# Patient Record
Sex: Female | Born: 1950 | Race: White | Hispanic: No | Marital: Married | Smoking: Never smoker
Health system: Southern US, Community
[De-identification: ages and names within clinical notes are randomized; demographics above are authoritative.]

## PROBLEM LIST (undated history)

## (undated) DIAGNOSIS — J45909 Unspecified asthma, uncomplicated: Secondary | ICD-10-CM

## (undated) DIAGNOSIS — K648 Other hemorrhoids: Secondary | ICD-10-CM

## (undated) DIAGNOSIS — K219 Gastro-esophageal reflux disease without esophagitis: Secondary | ICD-10-CM

## (undated) DIAGNOSIS — I509 Heart failure, unspecified: Secondary | ICD-10-CM

## (undated) DIAGNOSIS — R42 Dizziness and giddiness: Secondary | ICD-10-CM

## (undated) DIAGNOSIS — I10 Essential (primary) hypertension: Secondary | ICD-10-CM

## (undated) DIAGNOSIS — D509 Iron deficiency anemia, unspecified: Secondary | ICD-10-CM

## (undated) DIAGNOSIS — D126 Benign neoplasm of colon, unspecified: Secondary | ICD-10-CM

## (undated) DIAGNOSIS — M810 Age-related osteoporosis without current pathological fracture: Secondary | ICD-10-CM

## (undated) DIAGNOSIS — K279 Peptic ulcer, site unspecified, unspecified as acute or chronic, without hemorrhage or perforation: Secondary | ICD-10-CM

## (undated) HISTORY — DX: Benign neoplasm of colon, unspecified: D12.6

## (undated) HISTORY — PX: OTHER SURGICAL HISTORY: SHX169

## (undated) HISTORY — PX: CHOLECYSTECTOMY: SHX55

## (undated) HISTORY — PX: BACK SURGERY: SHX140

## (undated) HISTORY — PX: KNEE SURGERY: SHX244

## (undated) HISTORY — DX: Other hemorrhoids: K64.8

## (undated) HISTORY — DX: Iron deficiency anemia, unspecified: D50.9

## (undated) HISTORY — PX: SHOULDER SURGERY: SHX246

## (undated) HISTORY — DX: Peptic ulcer, site unspecified, unspecified as acute or chronic, without hemorrhage or perforation: K27.9

## (undated) HISTORY — DX: Essential (primary) hypertension: I10

## (undated) HISTORY — PX: BREAST SURGERY: SHX581

## (undated) HISTORY — PX: TONSILLECTOMY: SUR1361

---

## 1999-07-17 ENCOUNTER — Other Ambulatory Visit: Admission: RE | Admit: 1999-07-17 | Discharge: 1999-07-17 | Payer: Self-pay | Admitting: Obstetrics and Gynecology

## 2000-01-14 ENCOUNTER — Ambulatory Visit (HOSPITAL_COMMUNITY): Admission: RE | Admit: 2000-01-14 | Discharge: 2000-01-14 | Payer: Self-pay | Admitting: Family Medicine

## 2000-01-14 ENCOUNTER — Encounter: Payer: Self-pay | Admitting: Family Medicine

## 2000-07-02 ENCOUNTER — Encounter: Admission: RE | Admit: 2000-07-02 | Discharge: 2000-07-02 | Payer: Self-pay | Admitting: Family Medicine

## 2000-07-02 ENCOUNTER — Encounter: Payer: Self-pay | Admitting: Family Medicine

## 2000-08-08 ENCOUNTER — Emergency Department (HOSPITAL_COMMUNITY): Admission: EM | Admit: 2000-08-08 | Discharge: 2000-08-08 | Payer: Self-pay | Admitting: Emergency Medicine

## 2000-11-02 ENCOUNTER — Ambulatory Visit (HOSPITAL_COMMUNITY): Admission: RE | Admit: 2000-11-02 | Discharge: 2000-11-02 | Payer: Self-pay | Admitting: Family Medicine

## 2000-11-02 ENCOUNTER — Encounter: Payer: Self-pay | Admitting: Family Medicine

## 2001-06-27 ENCOUNTER — Ambulatory Visit (HOSPITAL_BASED_OUTPATIENT_CLINIC_OR_DEPARTMENT_OTHER): Admission: RE | Admit: 2001-06-27 | Discharge: 2001-06-27 | Payer: Self-pay | Admitting: Orthopedic Surgery

## 2002-05-11 ENCOUNTER — Other Ambulatory Visit: Admission: RE | Admit: 2002-05-11 | Discharge: 2002-05-11 | Payer: Self-pay | Admitting: *Deleted

## 2002-06-29 ENCOUNTER — Encounter: Payer: Self-pay | Admitting: Family Medicine

## 2002-06-29 ENCOUNTER — Ambulatory Visit (HOSPITAL_COMMUNITY): Admission: RE | Admit: 2002-06-29 | Discharge: 2002-06-29 | Payer: Self-pay | Admitting: Family Medicine

## 2002-07-03 ENCOUNTER — Ambulatory Visit: Admission: RE | Admit: 2002-07-03 | Discharge: 2002-07-03 | Payer: Self-pay | Admitting: Family Medicine

## 2002-07-24 ENCOUNTER — Encounter: Payer: Self-pay | Admitting: Family Medicine

## 2002-07-24 ENCOUNTER — Ambulatory Visit (HOSPITAL_COMMUNITY): Admission: RE | Admit: 2002-07-24 | Discharge: 2002-07-24 | Payer: Self-pay | Admitting: Family Medicine

## 2002-08-08 ENCOUNTER — Encounter: Payer: Self-pay | Admitting: Family Medicine

## 2002-08-08 ENCOUNTER — Ambulatory Visit (HOSPITAL_COMMUNITY): Admission: RE | Admit: 2002-08-08 | Discharge: 2002-08-08 | Payer: Self-pay | Admitting: Family Medicine

## 2003-06-16 ENCOUNTER — Ambulatory Visit (HOSPITAL_COMMUNITY): Admission: RE | Admit: 2003-06-16 | Discharge: 2003-06-16 | Payer: Self-pay | Admitting: Family Medicine

## 2003-06-16 ENCOUNTER — Encounter: Payer: Self-pay | Admitting: Family Medicine

## 2003-11-26 ENCOUNTER — Ambulatory Visit (HOSPITAL_COMMUNITY): Admission: RE | Admit: 2003-11-26 | Discharge: 2003-11-26 | Payer: Self-pay | Admitting: Gastroenterology

## 2004-01-09 ENCOUNTER — Ambulatory Visit (HOSPITAL_COMMUNITY): Admission: RE | Admit: 2004-01-09 | Discharge: 2004-01-09 | Payer: Self-pay | Admitting: Gastroenterology

## 2004-01-31 ENCOUNTER — Encounter (INDEPENDENT_AMBULATORY_CARE_PROVIDER_SITE_OTHER): Payer: Self-pay | Admitting: Specialist

## 2004-01-31 ENCOUNTER — Inpatient Hospital Stay (HOSPITAL_COMMUNITY): Admission: RE | Admit: 2004-01-31 | Discharge: 2004-02-02 | Payer: Self-pay | Admitting: Plastic Surgery

## 2004-10-21 ENCOUNTER — Ambulatory Visit (HOSPITAL_COMMUNITY): Admission: RE | Admit: 2004-10-21 | Discharge: 2004-10-21 | Payer: Self-pay | Admitting: Family Medicine

## 2004-10-23 ENCOUNTER — Ambulatory Visit: Admission: RE | Admit: 2004-10-23 | Discharge: 2004-10-23 | Payer: Self-pay | Admitting: Family Medicine

## 2004-10-31 ENCOUNTER — Ambulatory Visit (HOSPITAL_COMMUNITY): Admission: RE | Admit: 2004-10-31 | Discharge: 2004-10-31 | Payer: Self-pay | Admitting: Family Medicine

## 2004-11-14 ENCOUNTER — Ambulatory Visit (HOSPITAL_COMMUNITY): Admission: RE | Admit: 2004-11-14 | Discharge: 2004-11-14 | Payer: Self-pay | Admitting: Family Medicine

## 2007-01-04 ENCOUNTER — Other Ambulatory Visit: Admission: RE | Admit: 2007-01-04 | Discharge: 2007-01-04 | Payer: Self-pay | Admitting: Obstetrics and Gynecology

## 2009-08-19 ENCOUNTER — Emergency Department (HOSPITAL_COMMUNITY): Admission: EM | Admit: 2009-08-19 | Discharge: 2009-08-20 | Payer: Self-pay | Admitting: Emergency Medicine

## 2010-12-16 LAB — DIFFERENTIAL
Basophils Absolute: 0 10*3/uL (ref 0.0–0.1)
Basophils Relative: 0 % (ref 0–1)
Eosinophils Absolute: 0 10*3/uL (ref 0.0–0.7)
Eosinophils Relative: 1 % (ref 0–5)
Lymphocytes Relative: 9 % — ABNORMAL LOW (ref 12–46)
Monocytes Absolute: 0.5 10*3/uL (ref 0.1–1.0)
Monocytes Relative: 5 % (ref 3–12)
Neutro Abs: 7.3 10*3/uL (ref 1.7–7.7)

## 2010-12-16 LAB — CBC
MCHC: 33.8 g/dL (ref 30.0–36.0)
RDW: 14.5 % (ref 11.5–15.5)
WBC: 8.6 10*3/uL (ref 4.0–10.5)

## 2010-12-16 LAB — POCT I-STAT, CHEM 8
Calcium, Ion: 1.08 mmol/L — ABNORMAL LOW (ref 1.12–1.32)
Chloride: 104 mEq/L (ref 96–112)
Hemoglobin: 12.9 g/dL (ref 12.0–15.0)
TCO2: 27 mmol/L (ref 0–100)

## 2011-01-30 NOTE — Op Note (Signed)
NAME:  [name], [name]                          ACCOUNT NO.:  [id number]   MEDICAL RECORD NO.:  [id number]                   PATIENT TYPE:  AMB   LOCATION:  ENDO                                 FACILITY:  [facility]   PHYSICIAN:  James L. Malon Kindle., M.D.          DATE OF BIRTH:  Feb 20, 1951   DATE OF PROCEDURE:  11/26/2003  DATE OF DISCHARGE:                                 OPERATIVE REPORT   PROCEDURE:  Esophagogastroduodenoscopy and biopsy.   MEDICATIONS:  1. Fentanyl 75 mg.  2. Versed 6 mg IV.   INDICATIONS FOR PROCEDURE:  The patient has had Burns lot of nausea, vomiting,  inability to eat, has had mini gastric bypass.   DESCRIPTION OF PROCEDURE:  The procedure had been explained to the patient  and consent obtained.  With the patient in the left lateral decubitus  position, the Olympus scope was inserted and advanced.  We reached the  stomach, and the patient had Burns very small gastric pouch, and we came to an  area in which she appeared to have two lumens.  It appeared to be Burns  gastrojejunostomy.  Right in the middle of the two limbs was an anastomotic  ulcer.  It was not actively bleeding.  I went down both limbs.  They were  widely patent.  There was no stenosis.  The scope was withdrawn back into  the gastric remnant, and Burns biopsy was taken for Helicobacter.  The ulcer was  not actively bleeding.  The gastric remnant was otherwise okay.  The scope  was withdrawn, and the distal and proximal esophagus are endoscopically  normal.   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  531.30.   PLAN:  1. We will start patient on Carafate.  2. We will give over-the-counter Prilosec as well and see back in the office     in 6-8 weeks.  3. We will check the results of the CLOtest.                                               James L. Malon Kindle., M.D.    Waldron Session  D:  11/26/2003  T:  11/26/2003  Job:  [id number]

## 2011-01-30 NOTE — Discharge Summary (Signed)
NAME:  [name], [name]                          ACCOUNT NO.:  [id number]   MEDICAL RECORD NO.:  [id number]                   PATIENT TYPE:  INP   LOCATION:  0449                                 FACILITY:  [facility]   PHYSICIAN:  Alfredia Ferguson, M.D.               DATE OF BIRTH:  Jul 24, 1951   DATE OF ADMISSION:  01/31/2004  DATE OF DISCHARGE:  02/02/2004                                 DISCHARGE SUMMARY   ADMISSION DIAGNOSES:  1. Large diastasis recti.  2. Status post massive weight loss (230 pounds).   DISCHARGE DIAGNOSES:  1. Large diastasis recti.  2. Status post massive weight loss (230 pounds).   OPERATIONS PERFORMED:  1. Circumferential abdominoplasty.  2. Mastopexy with implants.  3. Repair of large diastasis recti.   CHIEF COMPLAINT:  I have Burns large bulge in my abdomen.  I have Burns lot of  excess skin left over from weight loss.   HISTORY OF PRESENT ILLNESS:  This is Burns 60 year old woman who is Burns couple of  years status post bariatric surgery for obesity.  She has lost 230 pounds in  the last 2 years.  She has empty breasts with Burns large amount of excess skin.  She has Burns large amount of excess skin in her anterior abdomen, lateral thigh  areas, and posterior lower back.  She is admitted to the hospital at this  time for circumferential abdominoplasty, breast lift with implants, and  repair of Burns vary large diastasis recti.   PAST MEDICAL HISTORY:  1. Significant for mild hypertension, which has resolved with weight loss.  2. She also has Burns recent history of hypokalemia.   PAST SURGICAL HISTORY:  Bariatric stomach stapling.   PHYSICAL EXAMINATION:  Please see admission H&P for complete physical  examination.   ADMISSION LABORATORY VALUES:  Included Burns hemoglobin of 11.3, hematocrit of  33.7, and Burns white count of 4800.  Electrolytes showed Burns potassium of 3.7 on  admission, slightly low sodium at 134.  Her glucose was 84 on admission.  Urinalysis was negative.  Chest x-ray  showed no evidence of active disease,  and cardiogram was normal.   HOSPITAL COURSE:  On the day of admission, the patient was taken to the  operating room where she underwent circumferential abdominoplasty, repair of  diastasis recti, and Burns mastopexy with Mentor smooth shell saline implants,  300 cc, inflated to 325 cc bilaterally.  Surgery was uncomplicated.  The  postoperative course has been uneventful.  She was allowed to get out of bed  on the first postoperative morning.  She had PAS hose on the entire time she  was in the hospital while in bed.  Her diet was advanced on the evening  after surgery, and she was tolerating Burns regular diet on the following  morning after surgery.  Dressings were removed on the second postoperative  day.  All incisions were healing  nicely.  Breasts looked good with nipples  viable.  She has 4 drains in place, all of which are draining more than 30  cc per 24 hours.  The patient has been instructed on home drain care.  No  dressing is required for the abdominal dressing.  The patient does have  slight redness over her coccyx due to lying in bed for 2 days.  She was  advised to sleep either on her side, or with Burns padded area underneath her  coccyx.  Her pain is tolerable.  She has been given pain medication of  Vicodin 1-2 q.4h. p.r.n. pain, and Burns prescription for Keflex 500 mg q.i.d.  for 5 days.  Follow up will be provided in 6 days in my office.  The patient  was advised to call me if there are any questions or concerns when she goes  home.  The patient states she understands her instructions and is willing to  comply.                                               Alfredia Ferguson, M.D.    WBB/MEDQ  D:  02/02/2004  T:  02/02/2004  Job:  [id number]

## 2011-01-30 NOTE — Op Note (Signed)
NAME:  [name], [name]                          ACCOUNT NO.:  [id number]   MEDICAL RECORD NO.:  [id number]                   PATIENT TYPE:  INP   LOCATION:  0449                                 [facility]ACILITY:  [facility]   PHYSICIAN:  Alfredia Ferguson, M.D.               DATE OF BIRTH:  29-Sep-1950   DATE OF PROCEDURE:  01/31/2004  DATE OF DISCHARGE:                                 OPERATIVE REPORT   PREOPERATIVE DIAGNOSES:  1. Status post massive weight loss of 230 pounds.  2. Excess skin of abdomen.  3. Excess skin of lower back.  4. Bilateral breast ptosis with marked loss of volume.   POSTOPERATIVE DIAGNOSES:  1. Status post massive weight loss of 230 pounds.  2. Excess skin of abdomen.  3. Excess skin of lower back.  4. Bilateral breast ptosis with marked loss of volume.   OPERATION PERFORMED:  1. Circumferential abdominoplasty.  2. Augmentation mastopexy with Mentor smooth shell saline 300 mL implants     inflated to Burns total volume of 325 mL bilaterally.   SURGEON:  Dr. Benna Dunks   FIRST ASSISTANT:  Vevelyn Francois, RNFA   ANESTHESIA:  General endotracheal anesthesia.   INDICATION FOR SURGERY:  Burns 60 year old woman, who is status post bariatric  surgery.  She has lost approximately 230 pounds.  She now has excess skin of  her entire mid trunk.  She also has significant loss of volume of her  breasts with ptosis.  The patient wishes to have Burns breast lift and  augmented.  She wishes to have the maximum amount of skin removed from her  mid truncal area.  Potential risks of these surgeries including but not  limited to bleeding, infection, hematoma, seroma, the need for transfusion,  unsightly scarring, asymmetric results on side to the other, capsular  contracture of the breast implant, rippling of the implant, malposition of  the implant, infection of the implant, the need to replace the implant on  multiple occasions over her lifetime, wound healing difficulties, loss of  sensitivity  to nipple areolar complex, asymmetry of the breasts, prolonged  drainage from any of her drains in the back or abdomen, and overall  dissatisfaction with the results.  In spite of these and other risks  discussed with the patient, she wishes to proceed with the surgery.   DESCRIPTION OF OPERATION:  On the day prior to surgery, skin marks were  placed outlining dimensions of Burns Wise pattern mastopexy.  Dimensions of the  skin excision of the anterior and posterior trunks were also marked.  The  patient was taken to the operating room today where she was given general  endotracheal anesthesia.  Following anesthesia, she was rolled into Burns prone  position with all pressure points inspected and padded.  Tumescent solution  was infiltrated in the lower back using Burns solution of 1000 mL of Ringer's  lactate plus 1 ampule  of epinephrine plus 20 mL of 1% Xylocaine plain.  This  tumescent solution assisted with hemostasis.  The upper end of the skin mark  was now incised in the lower back.  This incision was deepened until getting  just above the fascia of the lower back.  The excess skin in the midline was  split in the midline between the anticipated line of dissection just above  her gluteal cleft and the incision which was already made.  The flap was  elevated in the middle to ensure closure without undue tension and once I  was certain that Burns tension-free closure could be accomplished, the excess  skin and fat was dissected from Burns medial to lateral direction.  Hemostasis  was meticulously maintained throughout the dissection.  The excess skin and  fat was carried all the way as far laterally as I could and at that point,  amputated.  The wound was copiously irrigated with saline irrigation.  Hemostasis was meticulously maintained.  The lower portion of the back  incision was undermined over the gluteal muscles for Burns distance of  approximately 6-7 cm bilaterally.  This allowed the upper buttocks  to  advance superiorly.  The posterior wound was closed over 2 Blake drains  which were brought out through separate stab incisions.  Burns combination of 0  Vicryl and 2-0 Vicryl in the dermis was placed.  Burns running 3-0 Monocryl for  the skin in the subcuticular position was used to close the skin edges.  Closure was accomplished without undue tension.  There was no blanching  along the incision line.  The patient's back skin was cleansed and dried,  and dressings were placed and secured in place with OpSite.  The patient was  rolled into Burns supine position onto another operating table.  All pressure  points were inspected and well-padded.  The patient's chest and abdomen were  prepped with Betadine and draped with sterile drapes.  Attention was first  directed to the breasts.  I opted to augment her with 300 mL Mentor smooth  shell saline implants, inflated to Burns total volume of 325 mL.  In  inframammary crease incision was made and deepened until reaching the  inferior pectoralis muscle fibers.  The fibers were opened, and Burns  subpectoral pocket was created.  Electrocautery and blunt dissection was  used to create the pocket.  Once Burns pocket of adequate size to accommodate  the desired implant had been created, it was irrigated with saline  irrigation.  The Mentor implant was prepared by evacuating the air and  placing 100 mL of saline.  The pocket was once again inspected and once  ready, the implant was placed in the desired position and filled to Burns total  volume of 325 mL.  The fill tubing was removed from the fill port, and Burns cap  over the fill port was pushed down into the opening.  The deep breast tissue  was now reapproximated at the incision using multiple interrupted 3-0 Vicryl  sutures.  Burns 42 mm diameter circle was drawn around the nipple, and this  circle was incised.  All skin marks were also incised.  The skin within the confines of these incisions were deepithelized.  Incisions  were made through  the dermis along the vertical limb of the medial and lateral breast flap and  along the new location for the nipple areolar complex to allow advancement  of the medial and lateral breast flap.  The  inferior corner of the medial  and lateral breast flaps were united to the mid portion of the inframammary  crease with Burns 2-0 Vicryl suture.  The superior corner of the vertical  incision the medial and lateral breast flaps were united to each other with  Burns similar suture.  The nipple areolar complex was pulled up to its new  location and fixed in position using multiple interrupted 3-0 Monocryl  suture for the dermis followed by Burns running 4-0 Monocryl subcuticular for  the skin edge.  The inframammary crease incision was closed using  interrupted 3-0 Monocryl suture for the dermis followed by Burns running 3-0  Monocryl subcuticular.  The vertical incision was closed in Burns similar  fashion.  Attention was directed to the left breast where identical  procedure was performed.  The implant was inflated to the same volume.  Symmetry was acceptable at the conclusion of the procedure.  The lower  abdomen was now inspected.  Skin marks were still in place from the  anticipated lines of excision.  Burns circular incision was made around the  umbilicus, and the umbilicus was dissected away from the surrounding tissue,  leaving Burns healthy cuff of fat to ensure vascular integrity.  The lower skin  incision in the abdomen was made, and Burns skin fat flap was elevated off of  the anterior abdominal wall fascia from an inferior to superior direction.  The abdominal flap was split in the midline to facilitate dissection.  The  flap was elevated to approximately 3-4 cm below the costal margins to help  with vascularity.  The midline dissection was carried all the way to the  xiphoid.  The patient had Burns very large diastasis measuring approximately 8-9  cm in width and approximately 12 cm in length.  This  diastasis was  imbricated and closed with multiple interrupted buried figure-of-eight 0  Prolene sutures.  The patient's back was now elevated on the operating table  to approximately 30 degrees, and the knees were flexed.  Two Blake drains  were placed in the wound and brought out through separate stab incisions.  The excess skin was pulled in an inferior direction as tightly as I could.  The skin was re-marked for the amount of skin to be removed, and the excess  skin was excised using Burns combination of sharp dissection and electrocautery  dissection.  As the skin was excised, it was Taylor-tacked to the lower  incision to ensure Burns tension-free closure.  Every 3-4 cm of skin removed,  the incision was tacked together.  The excision of skin continued laterally  until reaching the dog ear which had been created by the posterior back  excision.  This dog ear was removed.  Hemostasis was again meticulously maintained.  The wound was irrigated copiously with saline irrigation.  The  wound was temporarily stapled in position, and Burns new opening to the  umbilicus was made, and the umbilicus was brought through this new opening.  The umbilicus was fixed in this location with multiple interrupted 3-0  Monocryl suture.  The abdominal wound was closed using Burns combination of 0  Vicryl sutures for the lateral area where the maximum tension was and 2-0  Vicryl sutures for the more anterior incision.  Interrupted 3-0 Monocryls  were also used in between the 2-0 Vicryls.  Burns running 3-0 Monocryl  subcuticular was placed in the skin edges.  The patient tolerated the  procedure well with an estimated blood loss  of approximately 400 mL.  The  patient's breasts and chest were cleansed, dried, and Steri-Strips were  applied to the incision.  The patient was awakened, extubated, and  transported to her hospital bed in the position we placed her in on the  operating table.                                                Alfredia Ferguson, M.D.    WBB/MEDQ  D:  01/31/2004  T:  01/31/2004  Job:  [id number]

## 2011-01-30 NOTE — Op Note (Signed)
[place]  Patient:    [name], [name] Visit Number: [id number] MRN: [id number]          Service Type: DSU Location: [facility] Attending Physician:  Twana First Dictated by:   Elana Alm Thurston Hole, M.D. Proc. Date: 06/27/01 Admit Date:  06/27/2001                             Operative Report  PREOPERATIVE DIAGNOSIS:  Left shoulder partial rotator cuff tear with impingement.  POSTOPERATIVE DIAGNOSIS:  Left shoulder partial rotator cuff tear with partial impingement.  PROCEDURES: 1. Left shoulder examination under anesthesia, followed by arthroscopic    partial rotator cuff tear debridement. 2. Left shoulder subacromial decompression.  SURGEON:  Elana Alm. Thurston Hole, M.D.  ASSISTANT:  Julien Girt, P.Burns.  ANESTHESIA:  General.  OPERATIVE TIME:  45 minutes.  COMPLICATIONS:  None.  INDICATION FOR PROCEDURE:  [name] is Burns 60 year old woman who has had significant problems with pain in her left shoulder over the past four to five months, increasing in nature, with signs and symptoms and MRI documenting Burns partial rotator cuff tear and impingement, who has failed conservative care and is now to undergo arthroscopy.  DESCRIPTION OF PROCEDURE:  [name] was brought to the operating room on June 27, 2001, placed on the operative table in supine position.  After an adequate level of general anesthesia was obtained, her left shoulder was examined under anesthesia.  She had full range of motion in her shoulder with stable ligamentous exam.  After this was done, she was placed in Burns beach chair position and her shoulder and arm were prepped using sterile Betadine and draped using sterile technique.  Originally through Burns posterior arthroscopic portal, the arthroscope with the pump attached was placed into an anterior portal and an arthroscopic probe was placed.  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  The anterior and posterior labrum was intact.  Superior labrum and biceps tendon anchor showed some mild fraying, which was debrided with the biceps tendon anchor itself well-anchored.  The biceps tendon had some partial tearing, 20%, which was debrided; otherwise, it was intact.  The rotator cuff showed Burns partial undersurface tear 20% of the supraspinatus, which was debrided; otherwise, the rest of the rotator cuff was found to be intact.  The inferior capsule recess free of pathology.  After this was done, then the subacromial space was entered and Burns lateral arthroscopic portal was made.  The moderately thickened bursitis was resected.  Underneath this the rotator cuff was inflamed and thickened but no evidence of Burns tear.  The subacromial decompression was carried out, removing 6 mm of the undersurface of the anterior, anterolateral, and anteromedial acromion, and CA ligament release carried out.  The [facility] joint was not disturbed.  After this was done, the shoulder could be brought through Burns full range of motion with no impingement on the rotator cuff.  At this point it was felt that all pathology had been satisfactorily addressed.  The instruments were removed.  Portals closed with 3-0 nylon suture and injected with 0.25% Marcaine with epinephrine, sterile dressings and Burns sling applied, and the patient awakened and taken to the recovery room in stable condition.  FOLLOW-UP CARE:  [name] will be followed as an outpatient on Vicodin and Naprosyn.  See  her back in the office in Burns week for sutures out and follow-up. Dictated by:   Elana Alm Thurston Hole, M.D. Attending Physician:  Twana First DD:  06/27/01 TD:  06/27/01 Job: [id number]

## 2011-01-30 NOTE — Op Note (Signed)
NAME:  [name], [name]                          ACCOUNT NO.:  [id number]   MEDICAL RECORD NO.:  [id number]                   PATIENT TYPE:  AMB   LOCATION:  ENDO                                 FACILITY:  [facility]   PHYSICIAN:  James L. Malon Kindle., M.D.          DATE OF BIRTH:  1951-04-17   DATE OF PROCEDURE:  01/09/2004  DATE OF DISCHARGE:                                 OPERATIVE REPORT   PROCEDURE:  Esophagogastroduodenoscopy.   MEDICATIONS:  Fentanyl 50 mcg, Versed 6 mg IV.   INDICATIONS FOR PROCEDURE:  The patient has had Burns mini gastric bypass and Burns  large ulcer seen back in March __________  .  This was done to document  healing.  She will undergo surgery soon by Dr. Benna Dunks.   DESCRIPTION OF PROCEDURE:  The procedure had been explained to the patient,  consent obtained.  With the patient in the left lateral decubitus position,  the scope was inserted and advanced.  The aspirate pouch was entered. There  was Burns Billroth II, both limbs were entered and were normal.  Next, the  junction between the two limbs were crossed into the gastric mucosa, which  was where the ulcer was __________  small gastric pouch without ulceration.  The distal esophagus was  __________  removal and were normal.  The scope  was withdrawn, the patient tolerated the procedure well.   ASSESSMENT:  Anastomotic ulcer healed, 531.71.   PLAN:  Will stop the Carafate and continue on Prilosec over the counter. She  will return to see me in six months.  Go ahead with her surgery we Dr.  Benna Dunks.                                               James L. Malon Kindle., M.D.    Waldron Session  D:  01/09/2004  T:  01/09/2004  Job:  [id number]

## 2011-06-10 ENCOUNTER — Other Ambulatory Visit: Payer: Self-pay | Admitting: Family Medicine

## 2011-06-10 ENCOUNTER — Ambulatory Visit
Admission: RE | Admit: 2011-06-10 | Discharge: 2011-06-10 | Disposition: A | Discharge status: Home / Self Care | Payer: 59 | Source: Ambulatory Visit | Attending: Family Medicine | Admitting: Family Medicine

## 2011-06-10 DIAGNOSIS — R0600 Dyspnea, unspecified: Secondary | ICD-10-CM

## 2011-06-10 MED ORDER — IOHEXOL 300 MG/ML  SOLN
125.0000 mL | Freq: Once | INTRAMUSCULAR | Status: AC | PRN
  Administered 2011-06-10: 125 mL via INTRAVENOUS

## 2011-06-15 ENCOUNTER — Other Ambulatory Visit: Payer: Self-pay | Admitting: Family Medicine

## 2011-06-15 DIAGNOSIS — R609 Edema, unspecified: Secondary | ICD-10-CM

## 2011-06-15 DIAGNOSIS — R52 Pain, unspecified: Secondary | ICD-10-CM

## 2011-06-16 ENCOUNTER — Ambulatory Visit
Admission: RE | Admit: 2011-06-16 | Discharge: 2011-06-16 | Disposition: A | Discharge status: Home / Self Care | Payer: 59 | Source: Ambulatory Visit | Attending: Family Medicine | Admitting: Family Medicine

## 2011-06-16 DIAGNOSIS — R609 Edema, unspecified: Secondary | ICD-10-CM

## 2011-06-16 DIAGNOSIS — R52 Pain, unspecified: Secondary | ICD-10-CM

## 2011-07-14 ENCOUNTER — Inpatient Hospital Stay (HOSPITAL_BASED_OUTPATIENT_CLINIC_OR_DEPARTMENT_OTHER)
Admission: RE | Admit: 2011-07-14 | Discharge: 2011-07-14 | Disposition: A | Discharge status: Home / Self Care | Payer: 59 | Source: Ambulatory Visit | Attending: Cardiology | Admitting: Cardiology

## 2011-07-14 DIAGNOSIS — I472 Ventricular tachycardia, unspecified: Secondary | ICD-10-CM | POA: Insufficient documentation

## 2011-07-14 DIAGNOSIS — R609 Edema, unspecified: Secondary | ICD-10-CM | POA: Insufficient documentation

## 2011-07-14 DIAGNOSIS — I4729 Other ventricular tachycardia: Secondary | ICD-10-CM | POA: Insufficient documentation

## 2011-07-14 DIAGNOSIS — I059 Rheumatic mitral valve disease, unspecified: Secondary | ICD-10-CM | POA: Insufficient documentation

## 2011-07-14 LAB — POCT I-STAT 3, VENOUS BLOOD GAS (G3P V)
Acid-base deficit: 6 mmol/L — ABNORMAL HIGH (ref 0.0–2.0)
O2 Saturation: 68 %
pH, Ven: 7.296 (ref 7.250–7.300)

## 2011-07-14 LAB — POCT I-STAT 3, ART BLOOD GAS (G3+)
Bicarbonate: 21.1 mEq/L (ref 20.0–24.0)
pCO2 arterial: 39.2 mmHg (ref 35.0–45.0)
pH, Arterial: 7.338 — ABNORMAL LOW (ref 7.350–7.400)

## 2011-07-16 NOTE — Cardiovascular Report (Signed)
NAME:  [name], [name]                ACCOUNT NO.:  [id number]  MEDICAL RECORD NO.:  [id number]  LOCATION:                                 FACILITY:  PHYSICIAN:  Jake Bathe, MD           DATE OF BIRTH:  DATE OF PROCEDURE: DATE OF DISCHARGE:                           CARDIAC CATHETERIZATION   INDICATIONS:  [name] is a 60 year old female with lower extremity edema, and Holter monitor which was done after a syncopal episode demonstrating slow ventricular rhythm concerning for degradation to ventricular tachycardia.  Informed consent was performed.  Risk and benefits of procedure were discussed including stroke, heart attack, death, renal impairment, bleeding, she decided to proceed after time for questioning.  PROCEDURE IN DETAILS:  Lidocaine 1% was used for local anesthesia after visualizing the femoral head with fluoroscopy.  A 4-French sheath was inserted to the right femoral artery.  A Judkins left #4 and a no-torque Williams right were used to selectively cannulate the coronary arteries. Multiple views of the hand injection of Omnipaque were obtained.  Angled pigtail was used to cross the left ventricle.  Power injection with 25 mL of contrast was performed.  A right heart catheterizations was then performed secondary to her increasing lower extremity edema, and a 7- French sheath was inserted into the right femoral vein with the modified Seldinger technique.  Right-sided pressures was performed.  Saturations drawn.  Following procedure, catheters and sheaths were removed.  FINDINGS:  Coronary arteries.  Left main branches into the LAD as well as the circumflex artery and is short.  The circumflex is large, dominant giving rise to the posterior descending artery as well as 2 other obtuse marginal branches, and the LAD is moderate sized and caliber when compared to the circumflex artery and gives rise to 1 large diagonal branch.  The arteries themselves are quite tortuous,  but demonstrate no evidence of any flow-limiting coronary artery disease. The right coronary artery is small and is nondominant.  Left ventriculogram demonstrated normal left ventricular ejection fraction of 55% with mild mitral regurgitation.  No wall motion abnormalities demonstrated.  Ascending aorta appears normal.  Right heart catheterizations demonstrated a PA saturation of 68%, AO saturation of 95%.  Cardiac output of 5.2 liters/minute with a cardiac index of 2.9, normal, right atrial pressure 12/11 with a mean of 10, right ventricular pressure 32/7 with an end-diastolic pressure of 12 mmHg, wedge pressure of 18/15 with a mean of 13 mmHg, pulmonary artery pressure of 31/12 with a mean of 20 mmHg, left ventricular pressure was 110/11 with an end- diastolic pressure of 17 mmHg, aortic pressure is 112/61 with a mean of 84 mmHg.  There was no significant gradient.  IMPRESSIONS: 1. No angiographically significant coronary artery disease with left     dominant system. 2. Normal left ventricular ejection fraction with no wall motion     abnormalities and mild mitral regurgitation with no aortic     stenosis. 3. Normal right heart catheterizations with no evidence of pulmonary     hypertension with right atrial pressures, upper limits of normal at     10 mmHg/mildly elevated, but not high  enough to be responsible for     increasing lower extremity edema.  Findings have been discussed     with the patient given the slow idioventricular rhythm seen mostly     at night.  We will go ahead and proceed with sleep study to ensure     that she is not having any     hypoxic episodes contributing to these events.  I will also go     ahead and initiate metoprolol succinate 25 mg once a day to help     suppress rhythm.  Findings were discussed with the patient.  Note,     she does have low albumin, CT scan negative for pulmonary embolism.     Jake Bathe, MD     MCS/MEDQ  D:  07/14/2011   T:  07/14/2011  Job:  [id number]  cc:   Deatra James, M.D.  Electronically Signed by Donato Schultz MD on 07/16/2011 06:22:10 AM

## 2012-10-18 ENCOUNTER — Other Ambulatory Visit (HOSPITAL_COMMUNITY)
Admission: RE | Admit: 2012-10-18 | Discharge: 2012-10-18 | Disposition: A | Discharge status: Home / Self Care | Payer: 59 | Source: Ambulatory Visit | Attending: Obstetrics and Gynecology | Admitting: Obstetrics and Gynecology

## 2012-10-18 ENCOUNTER — Other Ambulatory Visit: Payer: Self-pay | Admitting: Nurse Practitioner

## 2012-10-18 DIAGNOSIS — Z1151 Encounter for screening for human papillomavirus (HPV): Secondary | ICD-10-CM | POA: Insufficient documentation

## 2012-10-18 DIAGNOSIS — R87810 Cervical high risk human papillomavirus (HPV) DNA test positive: Secondary | ICD-10-CM | POA: Insufficient documentation

## 2012-10-18 DIAGNOSIS — Z01419 Encounter for gynecological examination (general) (routine) without abnormal findings: Secondary | ICD-10-CM | POA: Insufficient documentation

## 2013-06-01 ENCOUNTER — Other Ambulatory Visit: Payer: Self-pay | Admitting: Gastroenterology

## 2013-11-30 ENCOUNTER — Other Ambulatory Visit (HOSPITAL_COMMUNITY)
Admission: RE | Admit: 2013-11-30 | Discharge: 2013-11-30 | Disposition: A | Discharge status: Home / Self Care | Payer: 59 | Source: Ambulatory Visit | Attending: Obstetrics and Gynecology | Admitting: Obstetrics and Gynecology

## 2013-11-30 ENCOUNTER — Other Ambulatory Visit: Payer: Self-pay | Admitting: Nurse Practitioner

## 2013-11-30 DIAGNOSIS — Z01419 Encounter for gynecological examination (general) (routine) without abnormal findings: Secondary | ICD-10-CM | POA: Insufficient documentation

## 2014-02-26 ENCOUNTER — Other Ambulatory Visit: Payer: Self-pay | Admitting: Family Medicine

## 2014-02-26 DIAGNOSIS — R1010 Upper abdominal pain, unspecified: Secondary | ICD-10-CM

## 2014-02-27 ENCOUNTER — Ambulatory Visit
Admission: RE | Admit: 2014-02-27 | Discharge: 2014-02-27 | Disposition: A | Discharge status: Home / Self Care | Payer: 59 | Source: Ambulatory Visit | Attending: Family Medicine | Admitting: Family Medicine

## 2014-02-27 DIAGNOSIS — R1010 Upper abdominal pain, unspecified: Secondary | ICD-10-CM

## 2014-02-27 MED ORDER — IOHEXOL 300 MG/ML  SOLN
100.0000 mL | Freq: Once | INTRAMUSCULAR | Status: AC | PRN
  Administered 2014-02-27: 100 mL via INTRAVENOUS

## 2014-07-30 ENCOUNTER — Ambulatory Visit
Admission: RE | Admit: 2014-07-30 | Discharge: 2014-07-30 | Disposition: A | Discharge status: Home / Self Care | Payer: 59 | Source: Ambulatory Visit | Attending: Family Medicine | Admitting: Family Medicine

## 2014-07-30 ENCOUNTER — Other Ambulatory Visit: Payer: Self-pay | Admitting: Family Medicine

## 2014-07-30 DIAGNOSIS — R609 Edema, unspecified: Secondary | ICD-10-CM

## 2014-08-15 ENCOUNTER — Ambulatory Visit (INDEPENDENT_AMBULATORY_CARE_PROVIDER_SITE_OTHER): Payer: 59 | Admitting: Internal Medicine

## 2014-08-15 ENCOUNTER — Encounter: Payer: Self-pay | Admitting: Internal Medicine

## 2014-08-15 VITALS — BP 118/64 | HR 69 | Temp 98.1°F | Resp 12 | Ht 63.5 in | Wt 170.8 lb

## 2014-08-15 DIAGNOSIS — E559 Vitamin D deficiency, unspecified: Secondary | ICD-10-CM

## 2014-08-15 DIAGNOSIS — N2581 Secondary hyperparathyroidism of renal origin: Secondary | ICD-10-CM

## 2014-08-15 MED ORDER — CALCIUM CITRATE 250 MG PO TABS: ORAL_TABLET | ORAL | Status: DC

## 2014-08-15 NOTE — Patient Instructions (Addendum)
Please change the doses of your calcium and vit D supplements and move them as follows: Please stop the 70/30 insulin and start the following:   Before breakfast Breakfast Lunch Dinner  Multivitamin - 2 tabs -  -  Iron 1 tab - - -  Vitamin D 5000 units - 2 caps - -  Calcium citrate 250 mg - - 2 tabs 2 tabs   Please stop at the lab. Please come back for labs in 2 months. Please come back for a follow-up appointment in 4 months

## 2014-08-15 NOTE — Progress Notes (Signed)
Patient ID: [name], female   DOB: January 03, 1951, 63 y.o.   MRN: [id number]   HPI  [name] is a 63 y.o.-year-old female, referred by her PCP, Dr. 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 Patient is here with her husband who offers part of the history.  Pt was dx with hyperparathyroidism in ~2007. She has a h/o hypocalcemia, protein malnutrition, and vitamin D deficiency.   I reviewed pt's pertinent labs: 07/11/2014: ionized Ca 4.7 (4.5-5.6); phosphorus 3.1 (2.5-4.5) 06/28/2014: Ca 7.7 (8.6-10.3), albumin 2.8 (3.4-4.8), PTH 118, PTH rp (?) <0.74 05/29/2014: PTH 235.4, corrected calcium 8.0, vit D 38  02/26/2014: Ca 8.8, albumin 3.1 12/28/2013: Ca 7.7, albumin 3.4 07/03/2013: Ca 9.3, albumin 4.1 03/28/2013: vit D 24.8 07/06/2011: Ca 7.9 06/10/2011: Ca 8.1  She does have hand cramping and perioral numbness - going on for a long time.  She has a h/o "mini"-GBP in 2003 - ?RenY   She is on vit D 5000 units daily. She was previously on Calcitriol, stopped in Summer 2015 >> difficult to swallow.   Pt is on calcium carbonate 600 mg tid (was difficult to swallow, did not take them consistently) >> switched 2 weeks ago to: Ca carbonate 1200 mg - vit D1000 units 2x a day, now once a day.   She takes 1 MVI 3x a day >> total: vit D 1500 units + calcium 660 mg  >> total of:  - vitamin D: 8500 units - calcium: 3000 mg (probably absorbing ~2000 mg)  She eats few green leafy vegetables and no dairy.   She also takes iron 65 mg daily.   Pt has a h/o osteoporosis - dx 2 years ago. I reviewed pt's DEXA scans: Date L1-L4 T score FN T score 33% distal Radius  06/18/2014 -1.7 LFN: -3.5 R: -6.2!!!       She is on Prolia (5 doses >> last dose yesterday) >> BMD increased. She gets Prolia through Dr. Posey Pronto in [facility] ([facility]  + L wrist fracture in 06/2012 (fell).  No h/o kidney stones.  No h/o CKD. Last  BUN/Cr: Lab Results  Component Value Date   BUN <3* 08/19/2009   CREATININE 0.7 08/19/2009   Pt is not on HCTZ.  Pt does not have a FH of hypercalcemia, pituitary tumors, thyroid cancer, + osteoporosis in mother.   ROS: Constitutional: no weight gain/loss, + fatigue, no subjective hyperthermia/hypothermia Eyes: no blurry vision, no xerophthalmia ENT: no sore throat, no nodules palpated in throat, + dysphagia/no odynophagia, no hoarseness Cardiovascular: no CP/+ SOB/no palpitations/+ leg swelling Respiratory: no cough/+ SOB Gastrointestinal: + N/+ V/+ D/no C, + acid reflux Musculoskeletal: no muscle/joint aches Skin: no rashes, + easy bruising Neurological: no tremors/numbness/tingling/dizziness Psychiatric: no depression/anxiety  PMH: Hypertension Asthma Benign hematuria Hematuria Back pain IBS Anemia Cardiac catheterization in 2011: No CAD  No past surgical history.   History   Social History  . Marital Status: Married    Spouse Name: N/A    Number of Children: 1   Occupational History  .  retired    Social History Main Topics  . Smoking status: Never Smoker   . Smokeless tobacco: No  . Alcohol Use: No  . Drug Use: No   Name  Route  Sig   . Cholecalciferol (VITAMIN D3) 5000 UNITS CAPS   Oral   Take 2 capsules by mouth daily.    . Iron, Ferrous Gluconate, 256 (28 FE) MG  TABS   Oral   Take by mouth.    . Multiple Vitamin (MULTIVITAMIN) tablet   Oral   Take 1 tablet by mouth 3 (three) times daily.    . Calcium carbonate - with D       1200 mg - 1000 iu    . furosemide (LASIX) 40 MG tablet   Oral   Take 40 mg by mouth daily. 1/2 tablet at lunch    . ondansetron (ZOFRAN) 8 MG tablet   Oral   Take 8 mg by mouth every 8 (eight) hours as needed for nausea or vomiting.    . potassium chloride (KLOR-CON) 20 MEQ packet   Oral   Take 20 mEq by mouth 2 (two) times daily.     Allergies  Allergen Reactions  . Lac Bovis Diarrhea  . Prednisone  Diarrhea  . Sulfa Antibiotics Itching  . Zithromax [Azithromycin] Diarrhea   Family history: - Diabetes, HTN, HL, heart disease in father and PGF - Thyroid disease in mother and MGM - Cancer in father  PE: BP 118/64 mmHg  Pulse 69  Temp(Src) 98.1 F (36.7 C) (Oral)  Resp 12  Ht 5' 3.5" (1.613 m)  Wt 170 lb 12.8 oz (77.474 kg)  BMI 29.78 kg/m2  SpO2 95% Wt Readings from Last 3 Encounters:  08/15/14 170 lb 12.8 oz (77.474 kg)   Constitutional: overweight, in NAD. No kyphosis. Eyes: PERRLA, EOMI, no exophthalmos ENT: moist mucous membranes, no thyromegaly, no cervical lymphadenopathy; Chvostek sign negative bilaterally Cardiovascular: RRR, No MRG Respiratory: CTA B Gastrointestinal: abdomen soft, NT, ND, BS+ Musculoskeletal: Patient cannot extend her left hand 2/2 her previous wrist fracture, strength intact in all 4 Skin: moist, warm, no rashes Neurological: no tremor with outstretched hands, DTR normal in all 4  Assessment: 1. 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 Vitamin D deficiency  3. Osteoporosis - On Prolia  Plan: Patient with a history of gastric bypass in 2003 with subsequent malabsorption of calcium, vitamin D, and proteins. She has had low calcium levels per my review of the chart dating back to at least 2012. Lowest calcium was 7.7. A PTH was also found to be elevated, with the highest level at 235 in 05/2014. At that time, a corrected calcium was 8.0 (lower limit of normal 8.3) however a vitamin D level was normal, at 38. - I had a long discussion with the patient about the physiology of the parathyroid-calcium-vitamin D axis, and I explained that her parathyroid glands appeared to be functioning very well, so the parathyroid hormone is increased secondary to her decreased calcium. We will need to make sure that her calcium remains in the normal range in blood which would be a challenge, especially since she also has protein  malnutrition. She is trying to increase her proteins in her diet by eating a diet mostly formed of meat and protein shakes.  - We discussed about optimizing her calcium and vitamin D intake.  - I suggested to switch to calcium citrate, which is better absorbed, and also we'll increase her vitamin D supplementation to 10,000 units daily. I would've preferred to start her on ergocalciferol, however, she could not tolerate this in the past due to stomach irritation. We also have to be careful with her calcium tablets, since she cannot swallow large pills. - I will check a magnesium level and a 1,25 dihydroxy vitamin D today; and I would like to repeat her calcium, 25-hydroxy vitamin D, and  PTH in 2 months. If calcium and PTH are not improved at that time, we will need to start her back on calcitriol, however she had problems swallowing the pills in the past so she had to stop taking this - I gave her the following table regarding her supplement dosing:  Patient Instructions   Please change the doses of your calcium and vit D supplements and move them as follows:   Before breakfast Breakfast Lunch Dinner  Multivitamin - 2 tabs -  -  Iron 1 tab - - -  Vitamin D 5000 units - 2 caps - -  Calcium citrate 250 mg - - 2 tabs 2 tabs   Please stop at the lab. Please come back for labs in 2 months. Please come back for a follow-up appointment in 4 months  2. Vitamin D deficiency - We reviewed her previous lab results along with the patient and her husband - Please see above changes in her regimen  3. Osteoporosis - I reviewed the images and the report of her latest DEXA scan from earlier this year - She has severe osteoporosis at the level of the radius, which could've been caused by her calcium malabsorption and subsequent increase in PTH - She is now on Prolia which helped improve her BMD, and I believe she has 1 more injection left to complete the 3 year regimen. We discussed that she cannot come off  Prolia without transition to another osteoporosis medication, since her bone mineral density can abruptly decrease in that case. - She will have another injection in the middle of next year, and we'll discuss about an alternative medication in a year.  Return in about 4 months (around 12/15/2014).  - time spent with the patient: 1 hour, of which >50% was spent in obtaining information about her symptoms, reviewing her previous labs, evaluations, and treatments, counseling her about her condition (please see the discussed topics above), and developing a plan to further investigate it. She had a number of questions which I addressed.  Office Visit on 08/15/2014  Component Date Value Ref Range Status  . Magnesium 08/15/2014 1.6  1.5 - 2.5 mg/dL Final  . Vitamin D 1, 25 (OH)2 Total 08/15/2014 107* 18 - 72 pg/mL Final  . Vitamin D3 1, 25 (OH)2 08/15/2014 107   Final  . Vitamin D2 1, 25 (OH)2 08/15/2014 <8   Final   Comment: Vitamin D3, 1,25(OH)2 indicates both endogenous production and supplementation.  Vitamin D2, 1,25(OH)2 is an indicator of exogeous sources, such as diet or supplementation.  Interpretation and therapy are based on measurement of Vitamin D,1,25(OH)2, Total. This test was developed and its analytical performance characteristics have been determined by [facility], [place], [place]. It has not been cleared or approved by the FDA. This assay has been validated pursuant to the CLIA regulations and is used for clinical purposes.    Calcitriol increased, probably because of the increased PTH. I do not believe this is secondary to her previous treatment with calcitriol, since she stopped this several months ago. This suggests that she does not need supplementation with calcitriol as of now. Magnesium on the low side, I will advise the patient to start the magnesium supplement 400 or 500 milligrams daily.

## 2014-08-16 LAB — MAGNESIUM: Magnesium: 1.6 mg/dL (ref 1.5–2.5)

## 2014-08-19 LAB — VITAMIN D 1,25 DIHYDROXY
VITAMIN D3 1, 25 (OH): 107 pg/mL
Vitamin D 1, 25 (OH)2 Total: 107 pg/mL — ABNORMAL HIGH (ref 18–72)

## 2014-08-20 DIAGNOSIS — E559 Vitamin D deficiency, unspecified: Secondary | ICD-10-CM | POA: Insufficient documentation

## 2014-08-20 DIAGNOSIS — N2581 Secondary hyperparathyroidism of renal origin: Secondary | ICD-10-CM | POA: Insufficient documentation

## 2014-08-31 ENCOUNTER — Other Ambulatory Visit: Payer: Self-pay | Admitting: Family Medicine

## 2014-08-31 DIAGNOSIS — M7989 Other specified soft tissue disorders: Secondary | ICD-10-CM

## 2014-09-05 ENCOUNTER — Ambulatory Visit
Admission: RE | Admit: 2014-09-05 | Discharge: 2014-09-05 | Disposition: A | Discharge status: Home / Self Care | Payer: 59 | Source: Ambulatory Visit | Attending: Family Medicine | Admitting: Family Medicine

## 2014-09-05 DIAGNOSIS — M7989 Other specified soft tissue disorders: Secondary | ICD-10-CM

## 2014-10-22 ENCOUNTER — Encounter: Payer: 59 | Attending: Internal Medicine | Admitting: Dietician

## 2014-10-22 ENCOUNTER — Encounter: Payer: Self-pay | Admitting: Dietician

## 2014-10-22 VITALS — Wt 164.8 lb

## 2014-10-22 DIAGNOSIS — K912 Postsurgical malabsorption, not elsewhere classified: Secondary | ICD-10-CM

## 2014-10-22 DIAGNOSIS — Z713 Dietary counseling and surveillance: Secondary | ICD-10-CM | POA: Diagnosis not present

## 2014-10-22 NOTE — Progress Notes (Signed)
Medical Nutrition Therapy:  Appt start time: 1115 end time:  1215.   Assessment:  Primary concerns today: [name] is here today referred by Dr. Buddy Duty for several issues. She had mini gastric bypass surgery in 2003 and states she hasn't felt good in "quite some time." She reports that she was told that her calcium and protein levels are very low (Albumin 2.9 g/dL and total protein 4.9 g/dL). Muscle wasting is evident in temporal area. She frequently vomits after eating (about 4x a week) and states she is chronically nauseated. She has loose stools about 4-5x a day. Had an endoscopy and a colonoscopy in the last 6-8 months and everything was normal. Leyani states that she feels like these issues are getting progressively worse. She reports her normal body weight is 178 lbs and she maintained that weight for years. She has some intolerance to milk products and green vegetables "go right through me." Airyonna reports that she tries to get 70 grams of protein or more and has met this goal daily for years. Her dietary recall demonstrates 60-70 grams per day. Her diarrhea is not a dumping syndrome feeling. Jodelle also feels extreme fatigue (Hgb and Ferritin are normal) and has hypoglycemic episodes that occur most often between breakfast and lunch. Takes a multivitamin for women over 39. She has also been having swelling in her lower extremities and states "the more my legs swell the more nauseated and fatigued I feel."   Preferred Learning Style:   No preference indicated   Learning Readiness:   Ready  MEDICATIONS: see list   DIETARY INTAKE:  Usual eating pattern includes 3 meals and 2-3 snacks per day.  Avoided foods include green vegetables, milk, spicy food.    24-hr recall:  B ( AM): 2 eggs, scrambled or boiled, and 1/2 piece wheat toast, breakfast meat (19g) Snk ( AM):  L ( PM): 1.5-3 oz steak or hamburger or chicken or fish with potato (11-21g) Snk ( PM): nuts or occasionally Premier protein  shake  (7-30g) D ( PM): see lunch (11-21g) Snk (9 PM): yogurt or cottage cheese with fruit, occasionally ice cream (6-12g)  Beverages: mostly water, water with artificial sweetener, occasionally sweet tea, Sprite, occasional wine cooler  Usual physical activity: not much  Estimated energy needs: 1000-1300 calories 60-90g protein   Progress Towards Goal(s):  In progress.   Nutritional Diagnosis:  [place]-1.4 Altered GI function As related to history of gastric bypass surgery and possible lactose or other food intolerance.  As evidenced by patient report of loose stools 4-5x per day and vomiting 4x per week.    Intervention:  Nutrition counseling provided. Spoke with Dr. Cindra Eves nurse, Bernadette Hoit, about having vitamin levels assessed to rule out deficiencies. Patient Instructions: -Keep a log of foods that you do not tolerate -Keep glucose tablets on hand at all times  -If blood sugar is below 70, treat with 15 grams of carbohydrate -Bariatric Advantage (online) or Celebrate ([facility]) calcium citrate chews -Take Calcium at least 2 hours apart - take iron and Calcium 2 hours apart  -Continue to get at least 60 grams of protein per day -Continue eating protein foods at least 3x a day   Samples provided and patient instructed on proper use: Bariatric Advantage Calcium citrate chews (orange - qty 2) Lot#: [id number] Exp: 01/2015  Bariatric Advantage Calcium citrate chews (caramel - qty 2) Lot#: [id number] Exp: 01/2015  Teaching Method Utilized:  Auditory  Barriers to learning/adherence to lifestyle change: chronic  nausea and fatigue  Demonstrated degree of understanding via:  Teach Back   Monitoring/Evaluation:  Dietary intake, exercise, labs, and body weight prn.

## 2014-10-22 NOTE — Patient Instructions (Addendum)
-  Keep a log of foods that you do not tolerate -Keep glucose tablets on hand at all times  -If blood sugar is below 70, treat with 15 grams of carbohydrate -Bariatric Advantage (online) or Celebrate ([place] [facility]) calcium citrate chews -Take Calcium at least 2 hours apart - take iron and Calcium 2 hours apart  -Continue to get at least 60 grams of protein per day -Continue eating protein foods at least 3x a day

## 2014-10-30 ENCOUNTER — Other Ambulatory Visit: Payer: 59

## 2014-11-19 ENCOUNTER — Ambulatory Visit: Payer: 59 | Admitting: Internal Medicine

## 2014-12-12 ENCOUNTER — Encounter (HOSPITAL_BASED_OUTPATIENT_CLINIC_OR_DEPARTMENT_OTHER): Payer: Self-pay | Admitting: *Deleted

## 2014-12-12 ENCOUNTER — Emergency Department (HOSPITAL_BASED_OUTPATIENT_CLINIC_OR_DEPARTMENT_OTHER)
Admission: EM | Admit: 2014-12-12 | Discharge: 2014-12-12 | Disposition: A | Discharge status: Home / Self Care | Payer: 59 | Attending: Emergency Medicine | Admitting: Emergency Medicine

## 2014-12-12 DIAGNOSIS — B86 Scabies: Secondary | ICD-10-CM | POA: Diagnosis not present

## 2014-12-12 DIAGNOSIS — Z79899 Other long term (current) drug therapy: Secondary | ICD-10-CM | POA: Insufficient documentation

## 2014-12-12 DIAGNOSIS — R21 Rash and other nonspecific skin eruption: Secondary | ICD-10-CM | POA: Diagnosis present

## 2014-12-12 MED ORDER — PERMETHRIN 5 % EX CREA: TOPICAL_CREAM | CUTANEOUS | Status: DC

## 2014-12-12 MED ORDER — FAMOTIDINE 20 MG PO TABS: 20.0000 mg | ORAL_TABLET | Freq: Two times a day (BID) | ORAL | Status: DC

## 2014-12-12 MED ORDER — DIPHENHYDRAMINE HCL 25 MG PO CAPS: 25.0000 mg | ORAL_CAPSULE | Freq: Four times a day (QID) | ORAL | Status: DC | PRN

## 2014-12-12 NOTE — Discharge Instructions (Signed)
We are not sure what is causing your rash - we are giving you the medicine for scabies, as there are some features of that. See your doctor on Monday. Return to the ER if symptoms get worse, or there is bleeding, peeling of the skin.   TAKE THE MEDICINE AS FOLLOWING: Thoroughly massage cream (30 g for average adult) from head to soles of feet; leave on for 8-14 hours before removing (shower or bath); for infants and the elderly, also apply on the hairline, neck, scalp, temple, and forehead.   Contact Dermatitis Contact dermatitis is a reaction to certain substances that touch the skin. Contact dermatitis can be either irritant contact dermatitis or allergic contact dermatitis. Irritant contact dermatitis does not require previous exposure to the substance for a reaction to occur.Allergic contact dermatitis only occurs if you have been exposed to the substance before. Upon a repeat exposure, your body reacts to the substance.  CAUSES  Many substances can cause contact dermatitis. Irritant dermatitis is most commonly caused by repeated exposure to mildly irritating substances, such as:  Makeup.  Soaps.  Detergents.  Bleaches.  Acids.  Metal salts, such as nickel. Allergic contact dermatitis is most commonly caused by exposure to:  Poisonous plants.  Chemicals (deodorants, shampoos).  Jewelry.  Latex.  Neomycin in triple antibiotic cream.  Preservatives in products, including clothing. SYMPTOMS  The area of skin that is exposed may develop:  Dryness or flaking.  Redness.  Cracks.  Itching.  Pain or a burning sensation.  Blisters. With allergic contact dermatitis, there may also be swelling in areas such as the eyelids, mouth, or genitals.  DIAGNOSIS  Your caregiver can usually tell what the problem is by doing a physical exam. In cases where the cause is uncertain and an allergic contact dermatitis is suspected, a patch skin test may be performed to help determine  the cause of your dermatitis. TREATMENT Treatment includes protecting the skin from further contact with the irritating substance by avoiding that substance if possible. Barrier creams, powders, and gloves may be helpful. Your caregiver may also recommend:  Steroid creams or ointments applied 2 times daily. For best results, soak the rash area in cool water for 20 minutes. Then apply the medicine. Cover the area with a plastic wrap. You can store the steroid cream in the refrigerator for a "chilly" effect on your rash. That may decrease itching. Oral steroid medicines may be needed in more severe cases.  Antibiotics or antibacterial ointments if a skin infection is present.  Antihistamine lotion or an antihistamine taken by mouth to ease itching.  Lubricants to keep moisture in your skin.  Burow's solution to reduce redness and soreness or to dry a weeping rash. Mix one packet or tablet of solution in 2 cups cool water. Dip a clean washcloth in the mixture, wring it out a bit, and put it on the affected area. Leave the cloth in place for 30 minutes. Do this as often as possible throughout the day.  Taking several cornstarch or baking soda baths daily if the area is too large to cover with a washcloth. Harsh chemicals, such as alkalis or acids, can cause skin damage that is like a burn. You should flush your skin for 15 to 20 minutes with cold water after such an exposure. You should also seek immediate medical care after exposure. Bandages (dressings), antibiotics, and pain medicine may be needed for severely irritated skin.  HOME CARE INSTRUCTIONS  Avoid the substance that caused your  reaction.  Keep the area of skin that is affected away from hot water, soap, sunlight, chemicals, acidic substances, or anything else that would irritate your skin.  Do not scratch the rash. Scratching may cause the rash to become infected.  You may take cool baths to help stop the itching.  Only take  over-the-counter or prescription medicines as directed by your caregiver.  See your caregiver for follow-up care as directed to make sure your skin is healing properly. SEEK MEDICAL CARE IF:   Your condition is not better after 3 days of treatment.  You seem to be getting worse.  You see signs of infection such as swelling, tenderness, redness, soreness, or warmth in the affected area.  You have any problems related to your medicines. Document Released: 08/28/2000 Document Revised: 11/23/2011 Document Reviewed: 02/03/2011 [facility] Patient Information 2015 [place], [place]. This information is not intended to replace advice given to you by your health care provider. Make sure you discuss any questions you have with your health care provider. Scabies Scabies are small bugs (mites) that burrow under the skin and cause red bumps and severe itching. These bugs can only be seen with a microscope. Scabies are highly contagious. They can spread easily from person to person by direct contact. They are also spread through sharing clothing or linens that have the scabies mites living in them. It is not unusual for an entire family to become infected through shared towels, clothing, or bedding.  HOME CARE INSTRUCTIONS   Your caregiver may prescribe a cream or lotion to kill the mites. If cream is prescribed, massage the cream into the entire body from the neck to the bottom of both feet. Also massage the cream into the scalp and face if your child is less than 78 year old. Avoid the eyes and mouth. Do not wash your hands after application.  Leave the cream on for 8 to 12 hours. Your child should bathe or shower after the 8 to 12 hour application period. Sometimes it is helpful to apply the cream to your child right before bedtime.  One treatment is usually effective and will eliminate approximately 95% of infestations. For severe cases, your caregiver may decide to repeat the treatment in 1 week. Everyone in  your household should be treated with one application of the cream.  New rashes or burrows should not appear within 24 to 48 hours after successful treatment. However, the itching and rash may last for 2 to 4 weeks after successful treatment. Your caregiver may prescribe a medicine to help with the itching or to help the rash go away more quickly.  Scabies can live on clothing or linens for up to 3 days. All of your child's recently used clothing, towels, stuffed toys, and bed linens should be washed in hot water and then dried in a dryer for at least 20 minutes on high heat. Items that cannot be washed should be enclosed in a plastic bag for at least 3 days.  To help relieve itching, bathe your child in a cool bath or apply cool washcloths to the affected areas.  Your child may return to school after treatment with the prescribed cream. SEEK MEDICAL CARE IF:   The itching persists longer than 4 weeks after treatment.  The rash spreads or becomes infected. Signs of infection include red blisters or yellow-tan crust. Document Released: 08/31/2005 Document Revised: 11/23/2011 Document Reviewed: 01/09/2009 [facility] Patient Information 2015 [place], [place]. This information is not intended to replace advice  given to you by your health care provider. Make sure you discuss any questions you have with your health care provider.

## 2014-12-12 NOTE — ED Notes (Signed)
Pt reports onset of itchy rash that started on the palms of her hands on Tuesday morning. Rash has spread on all of her body. Has been using cortisone creme and benadryl.

## 2014-12-12 NOTE — ED Provider Notes (Signed)
CSN: [id number]     Arrival date & time 12/12/14  2228 History  This chart was scribed for Varney Biles, MD by Evelene Croon, ED Scribe. This patient was seen in room MH01/MH01 and the patient's care was started 11:21 PM.    Chief Complaint  Patient presents with  . Rash     The history is provided by the patient. No language interpreter was used.     HPI Comments:  [name] is a 64 y.o. female who presents to the Emergency Department complaining of pruritic rash that she noticed early yesterday AM.  Pt states the rash started in the palms of her hands and has spread up her BUE, back and the back of her BLE. She has been taking benadryl every 2 hours with temporary relief of the itching. She has also been taking oatmeal baths and using cortisone cream. She denies recent change in soaps/lotions/detergents and medications, recent outdoor activity/ tick bites, recent exposure to animals with fleas and denies being around children recently. She also denies h/o skin disease and sick contacts. No associated symptoms noted.Marland Kitchen    History reviewed. No pertinent past medical history. Past Surgical History  Procedure Laterality Date  . Mini gastric bypass    . Cholecystectomy    . Breast surgery    . Knee surgery    . Shoulder surgery     Family History  Problem Relation Age of Onset  . Cancer Other   . Hypertension Other   . Stroke Other   . Diabetes Other   . Heart attack Other   . Obesity Other    History  Substance Use Topics  . Smoking status: Never Smoker   . Smokeless tobacco: Not on file  . Alcohol Use: No   OB History    No data available     Review of Systems  Constitutional: Negative for fever and chills.  Skin: Positive for rash.  All other systems reviewed and are negative.     Allergies  Lac bovis; Prednisone; Sulfa antibiotics; and Zithromax  Home Medications   Prior to Admission medications   Medication Sig Start Date End Date Taking?  Authorizing Provider  calcitRIOL (ROCALTROL) 0.5 MCG capsule Take 0.5 mcg by mouth daily.   Yes Historical Provider, MD  Calcium Citrate 250 MG TABS Take 2 tabs with lunch and 2 tabs with dinner 08/15/14  Yes Philemon Kingdom, MD  Cholecalciferol (VITAMIN D3) 5000 UNITS CAPS Take 2 capsules by mouth daily.   Yes Historical Provider, MD  furosemide (LASIX) 40 MG tablet Take 40 mg by mouth daily. 1/2 tablet at lunch   Yes Historical Provider, MD  magnesium oxide (MAG-OX) 400 MG tablet Take 400 mg by mouth daily.   Yes Historical Provider, MD  Multiple Vitamin (MULTIVITAMIN) tablet Take 1 tablet by mouth 3 (three) times daily.   Yes Historical Provider, MD  ondansetron (ZOFRAN) 8 MG tablet Take 8 mg by mouth every 8 (eight) hours as needed for nausea or vomiting.   Yes Historical Provider, MD  potassium chloride (KLOR-CON) 20 MEQ packet Take 20 mEq by mouth 2 (two) times daily.   Yes Historical Provider, MD  diphenhydrAMINE (BENADRYL) 25 mg capsule Take 1 capsule (25 mg total) by mouth every 6 (six) hours as needed for itching. 12/12/14   Varney Biles, MD  famotidine (PEPCID) 20 MG tablet Take 1 tablet (20 mg total) by mouth 2 (two) times daily. 12/12/14   Varney Biles, MD  Iron, Ferrous Gluconate,  256 (28 FE) MG TABS Take by mouth.    Historical Provider, MD  permethrin (ELIMITE) 5 % cream Apply to affected area once 12/12/14   Adisa Vigeant, MD   BP 99/60 mmHg  Pulse 57  Temp(Src) 97.7 F (36.5 C) (Oral)  Resp 20  Ht 5\' 4"  (1.626 m)  Wt 160 lb (72.576 kg)  BMI 27.45 kg/m2  SpO2 99% Physical Exam  Constitutional: She appears well-developed and well-nourished. No distress.  HENT:  Head: Normocephalic and atraumatic.  Eyes: Conjunctivae are normal.  Neck: Normal range of motion.  Cardiovascular: Normal rate.   Pulmonary/Chest: Effort normal.  Musculoskeletal: Normal range of motion.  Neurological: She is alert.  Skin: Skin is warm and dry. Rash noted.  Erymetatous macules over the BUE  including hands. No lesions in the webspace of the hands  Lesion extends to the axillary region and torso.   Nursing note and vitals reviewed.   ED Course  Procedures   DIAGNOSTIC STUDIES:  Oxygen Saturation is 99% on RA, normal by my interpretation.    COORDINATION OF CARE:  11:27 PM advised pt to follow up with PCP in a few days. Will discharge with prednisone. Discussed treatment plan with pt at bedside and pt agreed to plan.  Labs Review Labs Reviewed - No data to display  Imaging Review No results found.   EKG Interpretation None      MDM   Final diagnoses:  Rash/skin eruption  Scabies    I personally performed the services described in this documentation, which was scribed in my presence. The recorded information has been reviewed and is accurate.  Atypical rash. Is hypersensitive type - as there is itching. Scabies considered unlikely - no one else has same sx, she is not at risk for it - but the location and the type of lesions do raise the question of possible scabies. Pt and i discussed the uncertain diagnosis -and they prefer being treated for scabies and seeing their doctor if not better, that way they would have ruled out a possible etiology.   Varney Biles, MD 12/13/14 [phone]

## 2014-12-17 ENCOUNTER — Other Ambulatory Visit: Payer: Self-pay | Admitting: Gastroenterology

## 2015-01-15 ENCOUNTER — Ambulatory Visit: Payer: 59 | Admitting: Dietician

## 2015-01-17 ENCOUNTER — Encounter: Payer: Self-pay | Admitting: Dietician

## 2015-01-17 ENCOUNTER — Encounter: Payer: 59 | Attending: Internal Medicine | Admitting: Dietician

## 2015-01-17 VITALS — Wt 144.3 lb

## 2015-01-17 DIAGNOSIS — K912 Postsurgical malabsorption, not elsewhere classified: Secondary | ICD-10-CM | POA: Insufficient documentation

## 2015-01-17 DIAGNOSIS — Z713 Dietary counseling and surveillance: Secondary | ICD-10-CM | POA: Insufficient documentation

## 2015-01-17 DIAGNOSIS — R634 Abnormal weight loss: Secondary | ICD-10-CM

## 2015-01-17 NOTE — Patient Instructions (Addendum)
-  Avoid caffeine -Try well-cooked, low-fiber vegetables (see handout) -Continue having frequent, low fiber, high protein meals

## 2015-01-17 NOTE — Progress Notes (Signed)
  Medical Nutrition Therapy:  Appt start time: 1130 end time: 1200    Follow up:  Primary concerns today: [name] returns today having lost 20 pounds since last visit 3 months ago. She appears to have some muscle wasting in the temporal area. She reports that she is not intending to lose any weight. Went to [place] in [place] and developed bloody stools and coughing blood. Also developed a severe rash. She also recently found out that she has a large ulcer and may need to have surgery. Carafate and Nexium have been added but patient reports they do not help. Zinc has also been added to vitamin/mineral regimen since last visit. Plans to follow up with gastroenterologist on Monday. [name] is also having issues with shortness of breath. Has been drinking fruit/vegetable juice (carrot, cabbage, and celery). Feeling much better overall.   Preferred Learning Style:   No preference indicated   Learning Readiness:   Ready  MEDICATIONS: see list; Carafate and Nexium added   DIETARY INTAKE:  Usual eating pattern includes 3 meals and 2-3 snacks per day.  Avoided foods include green vegetables, milk, spicy food.    24-hr recall:  B ( AM): 2 eggs, sometimes with cheese with bacon, sausage, or ham, 1/2 piece toast, juice (sometimes eats all this in 2 sittings) Snk ( AM):  L ( PM): juice, seafood or 1/2 pimento cheese sandwich Snk (3:30 PM): crackers and cheese OR cottage cheese and canned peaches D ( PM): filet mignon, potato, spinach Snk (9 PM): rest of dinner  Beverages: mostly water, water with artificial sweetener, occasionally sweet tea  Usual physical activity: not much  Estimated energy needs: 1000-1300 calories 60-90g protein   Progress Towards Goal(s):  In progress.   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  As evidenced by patient report of loose stools 4-5x per  day and vomiting 4x per week.    Intervention:  Nutrition counseling provided.  -Avoid caffeine -Try well-cooked, low-fiber vegetables (see handout) -Continue having frequent, low fiber, high protein meals   Handouts provided: Low fiber food list  Teaching Method Utilized:  Auditory  Barriers to learning/adherence to lifestyle change: chronic nausea and fatigue  Demonstrated degree of understanding via:  Teach Back   Monitoring/Evaluation:  Dietary intake, exercise, labs, and body weight in 4 week(s).

## 2015-01-22 ENCOUNTER — Ambulatory Visit
Admission: RE | Admit: 2015-01-22 | Discharge: 2015-01-22 | Disposition: A | Discharge status: Home / Self Care | Payer: 59 | Source: Ambulatory Visit | Attending: Family Medicine | Admitting: Family Medicine

## 2015-01-22 ENCOUNTER — Other Ambulatory Visit: Payer: Self-pay | Admitting: Family Medicine

## 2015-01-22 DIAGNOSIS — R0600 Dyspnea, unspecified: Secondary | ICD-10-CM

## 2015-02-14 ENCOUNTER — Ambulatory Visit: Payer: 59 | Admitting: Dietician

## 2015-03-04 ENCOUNTER — Other Ambulatory Visit: Payer: Self-pay | Admitting: Gastroenterology

## 2015-03-07 ENCOUNTER — Other Ambulatory Visit: Payer: Self-pay | Admitting: Family Medicine

## 2015-03-07 ENCOUNTER — Ambulatory Visit
Admission: RE | Admit: 2015-03-07 | Discharge: 2015-03-07 | Disposition: A | Discharge status: Home / Self Care | Payer: 59 | Source: Ambulatory Visit | Attending: Family Medicine | Admitting: Family Medicine

## 2015-03-07 DIAGNOSIS — R1033 Periumbilical pain: Secondary | ICD-10-CM

## 2015-03-07 MED ORDER — IOPAMIDOL (ISOVUE-300) INJECTION 61%
100.0000 mL | Freq: Once | INTRAVENOUS | Status: AC | PRN
  Administered 2015-03-07: 100 mL via INTRAVENOUS

## 2015-05-21 ENCOUNTER — Ambulatory Visit: Payer: 59 | Attending: Family Medicine | Admitting: Occupational Therapy

## 2015-05-21 VITALS — Ht 63.0 in | Wt 154.8 lb

## 2015-05-21 DIAGNOSIS — I890 Lymphedema, not elsewhere classified: Secondary | ICD-10-CM | POA: Diagnosis not present

## 2015-05-21 NOTE — Patient Instructions (Signed)

## 2015-05-21 NOTE — Therapy (Signed)
[place] MAIN [facility] SERVICES [address] [place], [place], [address] Phone: [phone]   Fax:  [phone]  Occupational Therapy Evaluation  Patient Details  Name: [name] MRN: [id number] Date of Birth: 21-Apr-1951 Referring Provider:  Maury Dus, MD  Encounter Date: 05/21/2015      OT End of Session - 05/21/15 1527    Visit Number 1   Number of Visits 36   Date for OT Re-Evaluation 08/19/15   OT Start Time 0930   OT Stop Time 1037   OT Time Calculation (min) 67 min   Activity Tolerance Patient tolerated treatment well   Behavior During Therapy [facility] for tasks assessed/performed      Past Medical History  Diagnosis Date  . Peptic ulcer     Past Surgical History  Procedure Laterality Date  . Mini gastric bypass    . Cholecystectomy    . Breast surgery    . Knee surgery    . Shoulder surgery      Filed Vitals:   05/21/15 0937  Height: 5\' 3"  (1.6 m)  Weight: 154 lb 12.8 oz (70.217 kg)    Visit Diagnosis:  Lymphedema - Plan: Ot plan of care cert/re-cert      Subjective Assessment - 05/21/15 0946    Subjective  Pt is referred by Maury Dus, MD for evaluation and treatment of BLE lymphedema. 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 Pt reports swelling has gotten progressively worse over the  years. She reports that she is no longer able to "work through it". Pt relays that BLE swelling currently negatively impacts functional performance in all domains of life, including basic and instrumental ADLs, productive and leisure activities and pursuits, and social and community participation.   Patient is accompained by: Family member   Pertinent History osteporosis; s/p gastric bypass 2003 ( s/p ~250# wt loss);  strong + family hx for BLE lymphedema- mother and maternal  grandmother; BLE swelling onset early adulthood; frequent falls ( 3-4 in last month with frequent loss of balance   Limitations difficlty walking, standing, lower body dressing , fitting LB clothing and street shoes, transfers, bed moility, bathing, performing hygine, falls, limits participation in home management and productive work/ volunterr activities, limits ability to participate in  sociall family and travel activivities   Patient Stated Goals improve unctional ambulation, ability to transfer and move around in bed, and increase activity level w/ less sedentary hours ( Current Level of satiscation with performance of all activities rated as 10 on 1-10 scale w/ 1 most satified and 10 least satisfied)most dissatisfied) on 1-10 scall   Currently in Pain? 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   Onset Date 05/21/59   Prior Therapy no CDT; unable to tolerate existing off the shelf compression stockings   Precautions   Precautions --  osteoporosis   Precaution Comments lymphedema   Home  Environment   Adaptive equipment --  tub seat   Lives With Spouse   Prior Function   Level of Independence Independent with household mobility with device;Independent with community mobility with device  requires varying levels of assistance with functional ambulb   Vocation Retired   Leisure enjoys travel,  IADL   Shopping Assistance for transportation;Needs to be accompanied on any shopping trip   Light Housekeeping Performs light daily tasks but cannot maintain acceptable level of cleanliness   Meal Prep Able to complete simple cold meal and snack prep   Community Mobility Relies on family or friends for transportation   Mobility   Mobility Status History of falls   Mobility Status Comments dislikes using cane in public 2/2 stigma   Activity Tolerance   Activity Tolerance Endurance  does not limit participation in activity   Activity Tolerance Comments decreased ability to participate in all functional activities requiring standing and walking 2/2 escalating swelling in gravity dependent position   Observation/Other Assessments   Skin Integrity dry, flaking, dense lymphatic congestion palpable w/ 4+ pitting distal legs to toes;  indurated below knees to toes, mottled and reddened distally, tskin tight w/ limited flexibility, strong + stemmer R>L   ROM / Strength   AROM / PROM / Strength --  foot, ankle and knee AROM mildly limited by girth 2/2 swelli   Palpation   Palpation comment no tenderness w/ palpation   Hand Function   Comment R wrist fx 2/2 [place]           LYMPHEDEMA/ONCOLOGY QUESTIONNAIRE - 05/21/15 1517    What other symptoms do you have   Are you Having Heaviness or Tightness Yes   Are you having pitting edema Yes   Is it Hard or Difficult finding clothes that fit Yes   Do you have infections No   Is there Decreased scar mobility Yes   Stemmer Sign Yes   Lymphedema Stage   Stage STAGE 2 SPONTANEOUSLY IRREVERSIBLE   Lymphedema Assessments   Lymphedema Assessments Lower extremities   Right Lower Extremity Lymphedema   Other comparative BLE limb volumetrics TBA Rx visit 1   Left Lower Extremity Lymphedema   Other comparative BLE limb volumetrics TBA Rx visit 1                OT Treatments/Exercises (OP) - 05/21/15 0001    Transfers   Comments BLE swelling limits all STS, and car transfers   ADLs   LB Dressing BLE swelling limits fitting LB clothing and street shoes   Bathing BLE swelling w/  deep skin folds at ankles limits ability to bath feet and distal legs   Functional Mobility difficulty w/ all transferes 2/2 heavy swollen legs and decreased tissue flexibility   Cooking swelling and associated discomfort limits ability to prep food, to stand to cook and wash dishes and shop for food   Home Maintenance limited by BLE swelling and  decreased tissue flexibility   Driving limitted by BLE swelling, associated sensory symptoms, and tissue integrity   Work difficulty w/ all transferes 2/2 heavy swollen legs and decreased tissue flexibility   Leisure difficulty w/ all transferes 2/2 heavy swollen legs and decreased tissue flexibility; decreased standing and walking tolerance 2/2 progressive BLE LE limits social participation and community activities   ADL Education Given Yes               OT Education - 05/21/15 1709    Education provided Yes   Education Details Provided Pt/caregiver skilled  Education andADL training throughout visit for lymphedema etiology, progression and treatment course for Intensive and Management Phase Complete Decongestive Therapy (CDT)  Discussed lymphedema precautions, cellulitis risk,  And all CDT components, including compression wrapping, compression garment and devices, lymphatic pumping ther ex, simple self-MLD, and skin care.  Person(s) Educated Patient;Spouse   Methods Explanation;Demonstration;Handout   Comprehension Verbalized understanding;Need further instruction             OT Long Term Goals - 05/21/15 1711    OT LONG TERM GOAL #1   Title Pt able to correctly apply gradient compression wraps from toes to groiin with Max assistance from caregiver within 2 weeks for optimal limb volume reduction and LE self management over time.   Baseline dependent   Time 2   Period Weeks   Status New   OT LONG TERM GOAL #2   Title Pt to achieve 20% limb volume reductions bilaterally by DC to limit lymphedema (LE) progressio, to limit infection risk, to decrease falls risk, and to improve functional mobility and ambulation.   Baseline dependent   Time 12   Period Weeks   Status New   OT LONG TERM GOAL #3   Title Pt mod assist and at least 85% compliant with all LE self-care protocols, including simple self-manual lymphatic drainage (MLD), skin care, lymphatic pumping ther ex, and  donning/ doffing progression garments.   Baseline dependent   Time 12   Period Weeks   Status New   OT LONG TERM GOAL #4   Title Pt to remain infection free throughout CDT course to limit infection and LE progression.   Baseline dependent   Time 12   Status New   OT LONG TERM GOAL #5   Title During Management Phase CDT Pt to sustain limb volume reductions achieved during Intensive Phase CDT within 5% utilizing LE self-care protocols, appropriate compression garments/ devices, and moderate caregiver assistance.   Baseline dependent   Time 6   Period Months   Status New   Long Term Additional Goals   Additional Long Term Goals Yes   OT LONG TERM GOAL #6   Title Pt satisfaction with ability to perform safe functional ambulation to ienable her to complete food shopping activities to mprove from  level 10 ( totally unsatisfied)  to level 7 on a 1-10 scale with 1 being totally satisfied.   Baseline 10 ( totally unsatisfied on 1-10 scale   Time 12   Period Weeks   Status New   OT LONG TERM GOAL #7   Title Pt satisfaction with ability to perform safe functional mobility for car transfers, STS transfers, befd mobility, ytub/shower transfers)  to [facility] from  level 10 ( totally unsatisfied)  to level 7 on a 1-10 scale with 1 being totally satisfied.   Baseline 10 ( totally unsatisfied on 1-10 scale   Time 12   Period Weeks   Status New   OT LONG TERM GOAL #8   Title Pt satisfaction with current activity level needed  to participate in socail and community activities, and to complete ADLs, leisure and productive activities without excessive fatigue to mprove from  level 10 ( totally unsatisfied)  to level 7 on a 1-10 scale with 1 being totally satisfied.   Baseline 10 ( totally unsatisfied on 1-10 scale   Time 12   Period Weeks   Status New   OT LONG TERM GOAL  #9   Baseline Pt able to reduce time being sedentary from 8 waking hours daily to 2 hours daily in order to increase social and  community participation to improve overall life satisfaction.   Time 12   Period Weeks   Status New               Plan -  05/21/15 1537    Clinical Impression Statement Pt presents with moderate, stage 2, Primary, BLE lymphedema (LE) tarda consistent with strong positive family history (mother and grandmother) and onset at early adulthood. Swelling and associated sensory symptoms and discomfort has progressively worsened over many years and currently limits functional performance in all occupational domains, including basic and instrumental ADLs, functional ambulation and transfers, leisure pursuits and productive activities, and social participation and community access. Lymphedema presents significant infection risk and has contributed to history of increasing falls and wrist fracture. Without skilled Occupational Therapy for Complete Decongestive Therapy (CDT) to address chronic, progressive BLE LE, This Pt's condition will worsen and further functional decline is expected.   Pt will benefit from skilled therapeutic intervention in order to improve on the following deficits (Retired) Abnormal gait;Decreased range of motion;Difficulty walking;Impaired flexibility;Increased edema;Decreased activity tolerance;Decreased knowledge of precautions;Decreased skin integrity;Decreased knowledge of use of DME;Pain;Decreased mobility;Impaired perceived functional ability   Rehab Potential Good   OT Frequency 3x / week   OT Duration 12 weeks   OT Treatment/Interventions Self-care/ADL training;DME and/or AE instruction;Manual lymph drainage;Patient/family education;Compression bandaging;Therapeutic exercises;Therapeutic activities;Manual Therapy   Plan fit with BLE custom compression garments and devices - consider cll 3 flat knit Elvarex thigh highs, toe caps, and BLE Ginger Organ        Problem List Patient Active Problem List   Diagnosis Date Noted  . Vitamin D deficiency 08/20/2014  .  Secondary hyperparathyroidism 08/20/2014   Andrey Spearman, MS, OTR/L, CLT-LANA 05/21/2015 5:38 PM  Ansel Bong 05/21/2015, 5:38 PM  [place] MAIN [facility] SERVICES [address] [place], [place], [address] Phone: [phone]   Fax:  [phone]

## 2015-05-22 ENCOUNTER — Ambulatory Visit: Payer: 59 | Admitting: Occupational Therapy

## 2015-05-22 DIAGNOSIS — I890 Lymphedema, not elsewhere classified: Secondary | ICD-10-CM | POA: Diagnosis not present

## 2015-05-22 NOTE — Therapy (Signed)
[place] MAIN [facility] SERVICES [address] [place], [place], [address] Phone: [phone]   Fax:  [phone]  Occupational Therapy Treatment  Patient Details  Name: [name] MRN: [id number] Date of Birth: [birth year] Referring Provider:  Maury Dus, MD  Encounter Date: 05/22/2015      OT End of Session - 05/22/15 1216    Visit Number 2   Number of Visits 36   Date for OT Re-Evaluation 08/19/15   OT Start Time 0805   OT Stop Time 0923   OT Time Calculation (min) 78 min   Equipment Utilized During Treatment compression wraps- see TREATMENT secvtion for itemized list   Activity Tolerance Patient tolerated treatment well;Treatment limited secondary to medical complications (Comment)   Behavior During Therapy [facility] for tasks assessed/performed      Past Medical History  Diagnosis Date  . Peptic ulcer     Past Surgical History  Procedure Laterality Date  . Mini gastric bypass    . Cholecystectomy    . Breast surgery    . Knee surgery    . Shoulder surgery      There were no vitals filed for this visit.  Visit Diagnosis:  Lymphedema      Subjective Assessment - 05/22/15 1201    Subjective  Pt presents for visit 2 to begin LE Rx. Pt is accompanied by her husband, Richardson Landry. Pt is hopeful that she will see improvement in leg swelling w/ wraps applied today. She'll get a post op shoe after our session. Resources given.   Patient is accompained by: Family member   Pertinent History osteporosis; s/p gastric bypass 2003 ( s/p ~250# wt loss);  strong + family hx for BLE lymphedema- mother and maternal grandmother; BLE swelling onset early adulthood; frequent falls ( 3-4 in last month with frequent loss of balance   Limitations difficlty walking, standing, lower body dressing , fitting LB clothing and street shoes, transfers, bed moility, bathing, performing hygine, falls, limits participation in home management and productive work/  volunterr activities, limits ability to participate in  sociall family and travel activivities   Patient Stated Goals improve unctional ambulation, ability to transfer and move around in bed, and increase activity level w/ less sedentary hours ( Current Level of satiscation with performance of all activities rated as 10 on 1-10 scale w/ 1 most satified and 10 least satisfied)most dissatisfied) on 1-10 scall             LYMPHEDEMA/ONCOLOGY QUESTIONNAIRE - 05/22/15 1209    Right Lower Extremity Lymphedema   Other RLE limb vol=5,260.27 ml from toes to knee. RLE vol  from toes to groin= 11,226.23 ml   Other Limb volume differential below knee: 2.73%, L>R. LVE to groin= 0.18%, R>L   Left Lower Extremity Lymphedema   Other LLE limb vol=5;408.65ml from toes to knee. LLE vol  from toes to groin= 11,205.97 m                 OT Treatments/Exercises (OP) - 05/22/15 0001    ADLs   ADL Comments See Pt EDU section   ADL Education Given Yes   Manual Therapy   Manual Therapy Edema management;Compression Bandaging   Edema Management Completed comparative limb volumetrics today   Compression Bandaging LLE gradient compression wraps applied circumferentially in gradient configuration from toes to groin as follows: toe wrap x1 under cotton stockinett; 8 cm x 1 to foot and ankle, 10 cm x 2, then  12  cm x 2- all layered over .04 x 10 cm and 12 cm Rosidol Soft foam from A-G.                OT Education - 05/22/15 1215    Education provided Yes   Education Details Emphasis of LE selgf care training today on compression wraping. Good return for toe wraps and foam with cues and handouts. Cont next session so Pt can use wraps over upcoming vacation before she starts CDT   Person(s) Educated Patient;Spouse   Methods Explanation;Demonstration;Tactile cues;Verbal cues;Handout   Comprehension Verbalized understanding;Returned demonstration;Verbal cues required;Tactile cues required;Need further  instruction             OT Long Term Goals - 05/21/15 1711    OT LONG TERM GOAL #1   Title Pt able to correctly apply gradient compression wraps from toes to groiin with Max assistance from caregiver within 2 weeks for optimal limb volume reduction and LE self management over time.   Baseline dependent   Time 2   Period Weeks   Status New   OT LONG TERM GOAL #2   Title Pt to achieve 20% limb volume reductions bilaterally by DC to limit lymphedema (LE) progressio, to limit infection risk, to decrease falls risk, and to improve functional mobility and ambulation.   Baseline dependent   Time 12   Period Weeks   Status New   OT LONG TERM GOAL #3   Title Pt mod assist and at least 85% compliant with all LE self-care protocols, including simple self-manual lymphatic drainage (MLD), skin care, lymphatic pumping ther ex, and donning/ doffing progression garments.   Baseline dependent   Time 12   Period Weeks   Status New   OT LONG TERM GOAL #4   Title Pt to remain infection free throughout CDT course to limit infection and LE progression.   Baseline dependent   Time 12   Status New   OT LONG TERM GOAL #5   Title During Management Phase CDT Pt to sustain limb volume reductions achieved during Intensive Phase CDT within 5% utilizing LE self-care protocols, appropriate compression garments/ devices, and moderate caregiver assistance.   Baseline dependent   Time 6   Period Months   Status New   Long Term Additional Goals   Additional Long Term Goals Yes   OT LONG TERM GOAL #6   Title Pt satisfaction with ability to perform safe functional ambulation to ienable her to complete food shopping activities to mprove from  level 10 ( totally unsatisfied)  to level 7 on a 1-10 scale with 1 being totally satisfied.   Baseline 10 ( totally unsatisfied on 1-10 scale   Time 12   Period Weeks   Status New   OT LONG TERM GOAL #7   Title Pt satisfaction with ability to perform safe functional  mobility for car transfers, STS transfers, befd mobility, ytub/shower transfers)  to [facility] from  level 10 ( totally unsatisfied)  to level 7 on a 1-10 scale with 1 being totally satisfied.   Baseline 10 ( totally unsatisfied on 1-10 scale   Time 12   Period Weeks   Status New   OT LONG TERM GOAL #8   Title Pt satisfaction with current activity level needed  to participate in socail and community activities, and to complete ADLs, leisure and productive activities without excessive fatigue to mprove from  level 10 ( totally unsatisfied)  to level 7 on a 1-10 scale with  1 being totally satisfied.   Baseline 10 ( totally unsatisfied on 1-10 scale   Time 12   Period Weeks   Status New   OT LONG TERM GOAL  #9   Baseline Pt able to reduce time being sedentary from 8 waking hours daily to 2 hours daily in order to increase social and community participation to improve overall life satisfaction.   Time 12   Period Weeks   Status New               Plan - 05/22/15 1218    Clinical Impression Statement Pt tolerated compression wraps without difficulty in clinic. She did a nice job directing her husband while he was wrapping her to reduce tension as needed.   Pt will benefit from skilled therapeutic intervention in order to improve on the following deficits (Retired) Abnormal gait;Decreased range of motion;Difficulty walking;Impaired flexibility;Increased edema;Decreased activity tolerance;Decreased knowledge of precautions;Decreased skin integrity;Decreased knowledge of use of DME;Pain;Decreased mobility;Impaired perceived functional ability   Rehab Potential Good   OT Frequency 3x / week   OT Duration 12 weeks   OT Treatment/Interventions Self-care/ADL training;DME and/or AE instruction;Manual lymph drainage;Patient/family education;Compression bandaging;Therapeutic exercises;Therapeutic activities;Manual Therapy        Problem List Patient Active Problem List   Diagnosis Date Noted  .  Vitamin D deficiency 08/20/2014  . Secondary hyperparathyroidism 08/20/2014   Andrey Spearman, MS, OTR/L, CLT-LANA 05/22/2015 12:20 PM   Ansel Bong 05/22/2015, 12:20 PM  [place] MAIN [facility] SERVICES [address] [place], [place], [address] Phone: [phone]   Fax:  [phone]

## 2015-05-22 NOTE — Patient Instructions (Signed)
Lymphedema Preacautions as established- see eval.  Practice compression wrapping 1-2 x prior to next visit and wear to clinic so we can assess reteach.

## 2015-05-24 ENCOUNTER — Ambulatory Visit: Payer: 59 | Admitting: Occupational Therapy

## 2015-05-24 DIAGNOSIS — I890 Lymphedema, not elsewhere classified: Secondary | ICD-10-CM

## 2015-05-24 NOTE — Therapy (Signed)
[place] MAIN [facility] SERVICES [address] [place], [place], [address] Phone: [phone]   Fax:  [phone]  Occupational Therapy Treatment  Patient Details  Name: [name] MRN: [id number] Date of Birth: [birth year] Referring Provider:  Maury Dus, MD  Encounter Date: 05/24/2015      OT End of Session - 05/24/15 1338    Visit Number 3   Number of Visits 36   Date for OT Re-Evaluation 08/19/15   OT Start Time 0805   OT Stop Time 0908   OT Time Calculation (min) 63 min   Equipment Utilized During Treatment compression wraps- see TREATMENT secvtion for itemized list   Activity Tolerance Patient tolerated treatment well;Treatment limited secondary to medical complications (Comment)   Behavior During Therapy [facility] for tasks assessed/performed      Past Medical History  Diagnosis Date  . Peptic ulcer     Past Surgical History  Procedure Laterality Date  . Mini gastric bypass    . Cholecystectomy    . Breast surgery    . Knee surgery    . Shoulder surgery      There were no vitals filed for this visit.  Visit Diagnosis:  Lymphedema      Subjective Assessment - 05/24/15 1333    Subjective  Pt presents for visit 3 tIntensive Phase CDT to continue w/ ADL training for gradient compression wrapping. She is accompanied by her spouse, Richardson Landry, who did a fair job applying wraps during visit interval.   Patient is accompained by: Family member   Pertinent History osteporosis; s/p gastric bypass 2003 ( s/p ~250# wt loss);  strong + family hx for BLE lymphedema- mother and maternal grandmother; BLE swelling onset early adulthood; frequent falls ( 3-4 in last month with frequent loss of balance   Limitations difficlty walking, standing, lower body dressing , fitting LB clothing and street shoes, transfers, bed moility, bathing, performing hygine, falls, limits participation in home management and productive work/ volunterr activities,  limits ability to participate in  sociall family and travel activivities   Patient Stated Goals improve unctional ambulation, ability to transfer and move around in bed, and increase activity level w/ less sedentary hours ( Current Level of satiscation with performance of all activities rated as 10 on 1-10 scale w/ 1 most satified and 10 least satisfied)most dissatisfied) on 1-10 scall   Currently in Pain? No/denies                      OT Treatments/Exercises (OP) - 05/24/15 0001    ADLs   ADL Education Given Yes   Manual Therapy   Manual Therapy Edema management;Compression Bandaging   Compression Bandaging LLE gradient compression wraps applied circumferentially in gradient configuration from toes to groin as follows: toe wrap x1 under cotton stockinett; 8 cm x 1 to foot and ankle, 10 cm x 2, then  12 cm x 2- all layered over .04 x 10 cm and 12 cm Rosidol Soft foam from A-G.                OT Education - 05/24/15 1336    Education provided Yes   Education Details Continued Pt and caregiver instruction for gradient compression wraps application from toes to groin. Reviewed wear and care instructions. Provided skilled instriction for lymphatic pumping ther ex. By end of session Pt able to demonstrate proficiency. Wraps need more work.   Person(s) Educated Patient;Spouse   Methods Explanation;Demonstration;Tactile cues;Verbal  cues;Handout   Comprehension Verbalized understanding;Returned demonstration;Verbal cues required;Tactile cues required;Need further instruction             OT Long Term Goals - 05/21/15 1711    OT LONG TERM GOAL #1   Title Pt able to correctly apply gradient compression wraps from toes to groiin with Max assistance from caregiver within 2 weeks for optimal limb volume reduction and LE self management over time.   Baseline dependent   Time 2   Period Weeks   Status New   OT LONG TERM GOAL #2   Title Pt to achieve 20% limb volume  reductions bilaterally by DC to limit lymphedema (LE) progressio, to limit infection risk, to decrease falls risk, and to improve functional mobility and ambulation.   Baseline dependent   Time 12   Period Weeks   Status New   OT LONG TERM GOAL #3   Title Pt mod assist and at least 85% compliant with all LE self-care protocols, including simple self-manual lymphatic drainage (MLD), skin care, lymphatic pumping ther ex, and donning/ doffing progression garments.   Baseline dependent   Time 12   Period Weeks   Status New   OT LONG TERM GOAL #4   Title Pt to remain infection free throughout CDT course to limit infection and LE progression.   Baseline dependent   Time 12   Status New   OT LONG TERM GOAL #5   Title During Management Phase CDT Pt to sustain limb volume reductions achieved during Intensive Phase CDT within 5% utilizing LE self-care protocols, appropriate compression garments/ devices, and moderate caregiver assistance.   Baseline dependent   Time 6   Period Months   Status New   Long Term Additional Goals   Additional Long Term Goals Yes   OT LONG TERM GOAL #6   Title Pt satisfaction with ability to perform safe functional ambulation to ienable her to complete food shopping activities to mprove from  level 10 ( totally unsatisfied)  to level 7 on a 1-10 scale with 1 being totally satisfied.   Baseline 10 ( totally unsatisfied on 1-10 scale   Time 12   Period Weeks   Status New   OT LONG TERM GOAL #7   Title Pt satisfaction with ability to perform safe functional mobility for car transfers, STS transfers, befd mobility, ytub/shower transfers)  to [facility] from  level 10 ( totally unsatisfied)  to level 7 on a 1-10 scale with 1 being totally satisfied.   Baseline 10 ( totally unsatisfied on 1-10 scale   Time 12   Period Weeks   Status New   OT LONG TERM GOAL #8   Title Pt satisfaction with current activity level needed  to participate in socail and community activities, and  to complete ADLs, leisure and productive activities without excessive fatigue to mprove from  level 10 ( totally unsatisfied)  to level 7 on a 1-10 scale with 1 being totally satisfied.   Baseline 10 ( totally unsatisfied on 1-10 scale   Time 12   Period Weeks   Status New   OT LONG TERM GOAL  #9   Baseline Pt able to reduce time being sedentary from 8 waking hours daily to 2 hours daily in order to increase social and community participation to improve overall life satisfaction.   Time 12   Period Weeks   Status New               Plan -  05/24/15 1338    Clinical Impression Statement Pt tolerated compression without difficulty between sessions, and she and spouse collaborated well to apply wraps. Despite using incorrect technique, they made a very good effort. With more practivce and intruction they will master skills quickly. Commence MLD  next visit in ~ week after Pt returns from vacation to [place].   Pt will benefit from skilled therapeutic intervention in order to improve on the following deficits (Retired) Abnormal gait;Decreased range of motion;Difficulty walking;Impaired flexibility;Increased edema;Decreased activity tolerance;Decreased knowledge of precautions;Decreased skin integrity;Decreased knowledge of use of DME;Pain;Decreased mobility;Impaired perceived functional ability   Rehab Potential Good   OT Frequency 3x / week   OT Duration 12 weeks   OT Treatment/Interventions Self-care/ADL training;DME and/or AE instruction;Manual lymph drainage;Patient/family education;Compression bandaging;Therapeutic exercises;Therapeutic activities;Manual Therapy        Problem List Patient Active Problem List   Diagnosis Date Noted  . Vitamin D deficiency 08/20/2014  . Secondary hyperparathyroidism 08/20/2014   Andrey Spearman, MS, OTR/L, CLT-LANA 05/24/2015 1:46 PM   Ansel Bong 05/24/2015, 1:46 PM  [place] [facility] [facility] MAIN [facility]  SERVICES [address] [place], [place], [address] Phone: [phone]   Fax:  [phone]

## 2015-05-24 NOTE — Patient Instructions (Signed)
As established- see eval  

## 2015-06-05 ENCOUNTER — Ambulatory Visit: Payer: 59 | Admitting: Occupational Therapy

## 2015-06-05 DIAGNOSIS — I890 Lymphedema, not elsewhere classified: Secondary | ICD-10-CM | POA: Diagnosis not present

## 2015-06-05 NOTE — Patient Instructions (Signed)
As established- see eval  

## 2015-06-05 NOTE — Therapy (Signed)
[place] MAIN [facility] SERVICES [address] [place], [place], [address] Phone: [phone]   Fax:  [phone]  Occupational Therapy Treatment  Patient Details  Name: [name] MRN: [id number] Date of Birth: 10-22-1950 Referring Provider:  Maury Dus, MD  Encounter Date: 06/05/2015      OT End of Session - 06/05/15 1540    Visit Number 4   Number of Visits 36   Date for OT Re-Evaluation 08/19/15   OT Start Time 1300   OT Stop Time 1415   OT Time Calculation (min) 75 min   Equipment Utilized During Treatment compression wraps- see TREATMENT secvtion for itemized list   Activity Tolerance Patient tolerated treatment well;Treatment limited secondary to medical complications (Comment)   Behavior During Therapy [facility] for tasks assessed/performed      Past Medical History  Diagnosis Date  . Peptic ulcer     Past Surgical History  Procedure Laterality Date  . Mini gastric bypass    . Cholecystectomy    . Breast surgery    . Knee surgery    . Shoulder surgery      There were no vitals filed for this visit.  Visit Diagnosis:  Lymphedema      Subjective Assessment - 06/05/15 1534    Subjective  Pt presents for visit 4 tIntensive Phase CDT following week long cruise vacation. Pt arrived with compression wraps in place from toes to groin. Pt did a very nice job applying them by herself using good technique.    Patient is accompained by: Family member   Pertinent History osteporosis; s/p gastric bypass 2003 ( s/p ~250# wt loss);  strong + family hx for BLE lymphedema- mother and maternal grandmother; BLE swelling onset early adulthood; frequent falls ( 3-4 in last month with frequent loss of balance   Limitations difficlty walking, standing, lower body dressing , fitting LB clothing and street shoes, transfers, bed moility, bathing, performing hygine, falls, limits participation in home management and productive work/ volunterr  activities, limits ability to participate in  sociall family and travel activivities   Patient Stated Goals improve unctional ambulation, ability to transfer and move around in bed, and increase activity level w/ less sedentary hours ( Current Level of satiscation with performance of all activities rated as 10 on 1-10 scale w/ 1 most satified and 10 least satisfied)most dissatisfied) on 1-10 scall                      OT Treatments/Exercises (OP) - 06/05/15 0001    ADLs   ADL Education Given Yes   Manual Therapy   Manual Therapy Edema management;Compression Bandaging;Manual Lymphatic Drainage (MLD)   Edema Management Mild reduction in swelling noted today since last visit by visual assessment.   Manual Lymphatic Drainage (MLD) Manual lymph drainage (MLD) in supine utilizing functional inguinal lymph nodes and deep abdominal lymphatics as is customary for non-cancer related lower extremity LE, including bilateral "short neck" sequence, J strokes to sub and supraclavicular LN, deep abdominal pathways, functional inguinal LN, lower extremity proximal to distal w/ emphasis on medial knee bottleneck and politeal LN. Performed fibrosis technique to B maleoli and distal  legs to address fatty fibrosis. Good tolerance.   Compression Bandaging LLE gradient compression wraps applied circumferentially in gradient configuration from toes to groin as follows: toe wrap x1 under cotton stockinett; 8 cm x 1 to foot and ankle, 10 cm x 2, then  12 cm x 2-  all layered over .04 x 10 cm and 12 cm Rosidol Soft foam from A-G.                OT Education - 06/05/15 1538    Education provided Yes   Education Details Continued skilled Pt/caregiver Education  And LE ADL training throughout visit for lymphedema self care components, including compression wrapping, compression garment and device wear/care, lymphatic pumping ther ex, simple self-MLD, and skin care.    Person(s) Educated Patient;Spouse    Methods Explanation;Demonstration;Tactile cues;Verbal cues;Handout   Comprehension Verbalized understanding;Need further instruction;Returned demonstration;Verbal cues required;Tactile cues required             OT Long Term Goals - 05/21/15 1711    OT LONG TERM GOAL #1   Title Pt able to correctly apply gradient compression wraps from toes to groiin with Max assistance from caregiver within 2 weeks for optimal limb volume reduction and LE self management over time.   Baseline dependent   Time 2   Period Weeks   Status New   OT LONG TERM GOAL #2   Title Pt to achieve 20% limb volume reductions bilaterally by DC to limit lymphedema (LE) progressio, to limit infection risk, to decrease falls risk, and to improve functional mobility and ambulation.   Baseline dependent   Time 12   Period Weeks   Status New   OT LONG TERM GOAL #3   Title Pt mod assist and at least 85% compliant with all LE self-care protocols, including simple self-manual lymphatic drainage (MLD), skin care, lymphatic pumping ther ex, and donning/ doffing progression garments.   Baseline dependent   Time 12   Period Weeks   Status New   OT LONG TERM GOAL #4   Title Pt to remain infection free throughout CDT course to limit infection and LE progression.   Baseline dependent   Time 12   Status New   OT LONG TERM GOAL #5   Title During Management Phase CDT Pt to sustain limb volume reductions achieved during Intensive Phase CDT within 5% utilizing LE self-care protocols, appropriate compression garments/ devices, and moderate caregiver assistance.   Baseline dependent   Time 6   Period Months   Status New   Long Term Additional Goals   Additional Long Term Goals Yes   OT LONG TERM GOAL #6   Title Pt satisfaction with ability to perform safe functional ambulation to ienable her to complete food shopping activities to mprove from  level 10 ( totally unsatisfied)  to level 7 on a 1-10 scale with 1 being totally  satisfied.   Baseline 10 ( totally unsatisfied on 1-10 scale   Time 12   Period Weeks   Status New   OT LONG TERM GOAL #7   Title Pt satisfaction with ability to perform safe functional mobility for car transfers, STS transfers, befd mobility, ytub/shower transfers)  to [facility] from  level 10 ( totally unsatisfied)  to level 7 on a 1-10 scale with 1 being totally satisfied.   Baseline 10 ( totally unsatisfied on 1-10 scale   Time 12   Period Weeks   Status New   OT LONG TERM GOAL #8   Title Pt satisfaction with current activity level needed  to participate in socail and community activities, and to complete ADLs, leisure and productive activities without excessive fatigue to mprove from  level 10 ( totally unsatisfied)  to level 7 on a 1-10 scale with 1 being totally satisfied.  Baseline 10 ( totally unsatisfied on 1-10 scale   Time 12   Period Weeks   Status New   OT LONG TERM GOAL  #9   Baseline Pt able to reduce time being sedentary from 8 waking hours daily to 2 hours daily in order to increase social and community participation to improve overall life satisfaction.   Time 12   Period Weeks   Status New               Plan - 06/05/15 1541    Clinical Impression Statement Pt did a nice job applying wraps independently. Technique was well done, and will continue to improve w/ practice. Limb volume visibly decreased today compared toi last visit with Pt practicing wrapping intermittently during vacation. Pt tolerated MLD without difficulty. No increased pain.   Pt will benefit from skilled therapeutic intervention in order to improve on the following deficits (Retired) Abnormal gait;Decreased range of motion;Difficulty walking;Impaired flexibility;Increased edema;Decreased activity tolerance;Decreased knowledge of precautions;Decreased skin integrity;Decreased knowledge of use of DME;Pain;Decreased mobility;Impaired perceived functional ability   Rehab Potential Good   OT  Frequency 3x / week   OT Duration 12 weeks   OT Treatment/Interventions Self-care/ADL training;DME and/or AE instruction;Manual lymph drainage;Patient/family education;Compression bandaging;Therapeutic exercises;Therapeutic activities;Manual Therapy   Recommended Other Services Fit w/ custom Jobst Elvarex, ccl 2 or 3 (TBD), thigh length flat knit stocking and to cap for full time daiytime use. Fit w/ BLE convolouted foam HOS devices designed to reduce HOS swelling and fatty fibrosis formation. Consider Reid Sleeve  or JoviPak. Consider thigh and knee length based on Pt inpu   Consulted and Agree with Plan of Care Patient;Family member/caregiver        Problem List Patient Active Problem List   Diagnosis Date Noted  . Vitamin D deficiency 08/20/2014  . Secondary hyperparathyroidism 08/20/2014   Andrey Spearman, MS, OTR/L, CLT-LANA 06/05/2015 3:49 PM  Ansel Bong 06/05/2015, 3:49 PM  [place] MAIN [facility] SERVICES [address] [place], [place], [address] Phone: [phone]   Fax:  [phone]

## 2015-06-07 ENCOUNTER — Ambulatory Visit: Payer: 59 | Admitting: Occupational Therapy

## 2015-06-07 DIAGNOSIS — I890 Lymphedema, not elsewhere classified: Secondary | ICD-10-CM | POA: Diagnosis not present

## 2015-06-07 NOTE — Patient Instructions (Signed)
As established- see eval  

## 2015-06-07 NOTE — Therapy (Signed)
[place] MAIN [facility] SERVICES [address] [place], [place], [address] Phone: [phone]   Fax:  [phone]  Occupational Therapy Treatment  Patient Details  Name: [name] MRN: [id number] Date of Birth: 08-25-1951 Referring Provider:  Maury Dus, MD  Encounter Date: 06/07/2015      OT End of Session - 06/07/15 1148    Visit Number 5   Number of Visits 36   Date for OT Re-Evaluation 08/19/15   OT Start Time 0904   OT Stop Time 1003   OT Time Calculation (min) 59 min   Equipment Utilized During Treatment compression wraps- see TREATMENT secvtion for itemized list   Activity Tolerance Patient tolerated treatment well;Treatment limited secondary to medical complications (Comment)   Behavior During Therapy [facility] for tasks assessed/performed      Past Medical History  Diagnosis Date  . Peptic ulcer     Past Surgical History  Procedure Laterality Date  . Mini gastric bypass    . Cholecystectomy    . Breast surgery    . Knee surgery    . Shoulder surgery      There were no vitals filed for this visit.  Visit Diagnosis:  Lymphedema      Subjective Assessment - 06/07/15 1143    Subjective  Pt presents for visit 5 tIntensive Phase CDT with no new complaints. Pt arrived with compression wraps in place from toes to groin. She states she feeels confdent with her improving application technique at this point. Pt stes she is very pleased with limb volume reduction and tissue decongestion thus far.   Patient is accompained by: Family member   Pertinent History osteporosis; s/p gastric bypass 2003 ( s/p ~250# wt loss);  strong + family hx for BLE lymphedema- mother and maternal grandmother; BLE swelling onset early adulthood; frequent falls ( 3-4 in last month with frequent loss of balance   Limitations difficlty walking, standing, lower body dressing , fitting LB clothing and street shoes, transfers, bed moility, bathing, performing  hygine, falls, limits participation in home management and productive work/ volunterr activities, limits ability to participate in  sociall family and travel activivities   Patient Stated Goals improve unctional ambulation, ability to transfer and move around in bed, and increase activity level w/ less sedentary hours ( Current Level of satiscation with performance of all activities rated as 10 on 1-10 scale w/ 1 most satified and 10 least satisfied)most dissatisfied) on 1-10 scall   Currently in Pain? No/denies                      OT Treatments/Exercises (OP) - 06/07/15 0001    ADLs   ADL Education Given Yes   Manual Therapy   Manual Therapy Manual Lymphatic Drainage (MLD);Compression Bandaging;Edema management   Edema Management moderate visible and palpable reductions in both limb volume overall and distal l leg and ankle tissue density today.   Manual Lymphatic Drainage (MLD) Manual lymph drainage (MLD) in supine utilizing functional inguinal lymph nodes and deep abdominal lymphatics as is customary for non-cancer related lower extremity LE, including bilateral "short neck" sequence, J strokes to sub and supraclavicular LN, deep abdominal pathways, functional inguinal LN, lower extremity proximal to distal w/ emphasis on medial knee bottleneck and politeal LN. Performed fibrosis technique to B maleoli and distal  legs to address fatty fibrosis. Good tolerance.   Compression Bandaging LLE gradient compression wraps applied circumferentially in gradient configuration from toes to groin  as follows: toe wrap x1 under cotton stockinett; 8 cm x 1 to foot and ankle, 10 cm x 2, then  12 cm x 2- all layered over .04 x 10 cm and 12 cm Rosidol Soft foam from A-G.                OT Education - 06/07/15 1146    Education provided Yes   Education Details Emphasis of Pt edu today on lymphatic pumping ther ex and edu for sequential pneumatic LE pumps today. Provided info for both  Flexitouch (preferred 32 chamber) and Lymphapress. Pt will explore both on line this weekend. OT will contact [employer] vendor to arrange demo and trial.   Person(s) Educated Patient   Methods Explanation;Demonstration;Tactile cues;Verbal cues;Handout   Comprehension Verbalized understanding;Returned demonstration;Verbal cues required;Tactile cues required;Need further instruction             OT Long Term Goals - 05/21/15 1711    OT LONG TERM GOAL #1   Title Pt able to correctly apply gradient compression wraps from toes to groiin with Max assistance from caregiver within 2 weeks for optimal limb volume reduction and LE self management over time.   Baseline dependent   Time 2   Period Weeks   Status New   OT LONG TERM GOAL #2   Title Pt to achieve 20% limb volume reductions bilaterally by DC to limit lymphedema (LE) progressio, to limit infection risk, to decrease falls risk, and to improve functional mobility and ambulation.   Baseline dependent   Time 12   Period Weeks   Status New   OT LONG TERM GOAL #3   Title Pt mod assist and at least 85% compliant with all LE self-care protocols, including simple self-manual lymphatic drainage (MLD), skin care, lymphatic pumping ther ex, and donning/ doffing progression garments.   Baseline dependent   Time 12   Period Weeks   Status New   OT LONG TERM GOAL #4   Title Pt to remain infection free throughout CDT course to limit infection and LE progression.   Baseline dependent   Time 12   Status New   OT LONG TERM GOAL #5   Title During Management Phase CDT Pt to sustain limb volume reductions achieved during Intensive Phase CDT within 5% utilizing LE self-care protocols, appropriate compression garments/ devices, and moderate caregiver assistance.   Baseline dependent   Time 6   Period Months   Status New   Long Term Additional Goals   Additional Long Term Goals Yes   OT LONG TERM GOAL #6   Title Pt satisfaction with ability to perform  safe functional ambulation to ienable her to complete food shopping activities to mprove from  level 10 ( totally unsatisfied)  to level 7 on a 1-10 scale with 1 being totally satisfied.   Baseline 10 ( totally unsatisfied on 1-10 scale   Time 12   Period Weeks   Status New   OT LONG TERM GOAL #7   Title Pt satisfaction with ability to perform safe functional mobility for car transfers, STS transfers, befd mobility, ytub/shower transfers)  to [facility] from  level 10 ( totally unsatisfied)  to level 7 on a 1-10 scale with 1 being totally satisfied.   Baseline 10 ( totally unsatisfied on 1-10 scale   Time 12   Period Weeks   Status New   OT LONG TERM GOAL #8   Title Pt satisfaction with current activity level needed  to participate in  socail and community activities, and to complete ADLs, leisure and productive activities without excessive fatigue to mprove from  level 10 ( totally unsatisfied)  to level 7 on a 1-10 scale with 1 being totally satisfied.   Baseline 10 ( totally unsatisfied on 1-10 scale   Time 12   Period Weeks   Status New   OT LONG TERM GOAL  #9   Baseline Pt able to reduce time being sedentary from 8 waking hours daily to 2 hours daily in order to increase social and community participation to improve overall life satisfaction.   Time 12   Period Weeks   Status New               Plan - 06/07/15 1148    Clinical Impression Statement Pt demonstrates steady progress towards limb volume reduction and LE self care goals as evidenced by moderate visible and palpable reductions in LLE limb volume reduction and distal l leg + ankle tissue density today. Pt is independent with self wrapping and is educated or intro level knowledge of rational, precautions and use of sequential lymphadema pumps.   Pt will benefit from skilled therapeutic intervention in order to improve on the following deficits (Retired) Abnormal gait;Decreased range of motion;Difficulty walking;Impaired  flexibility;Increased edema;Decreased activity tolerance;Decreased knowledge of precautions;Decreased skin integrity;Decreased knowledge of use of DME;Pain;Decreased mobility;Impaired perceived functional ability   Rehab Potential Good   OT Frequency 3x / week   OT Duration 12 weeks   OT Treatment/Interventions Self-care/ADL training;DME and/or AE instruction;Manual lymph drainage;Patient/family education;Compression bandaging;Therapeutic exercises;Therapeutic activities;Manual Therapy   Consulted and Agree with Plan of Care Patient;Family member/caregiver        Problem List Patient Active Problem List   Diagnosis Date Noted  . Vitamin D deficiency 08/20/2014  . Secondary hyperparathyroidism 08/20/2014   Andrey Spearman, MS, OTR/L, [facility] 06/07/2015 11:52 AM  [place] MAIN [facility] SERVICES [address] [place], [place], [address] Phone: [phone]   Fax:  [phone]

## 2015-06-10 ENCOUNTER — Ambulatory Visit: Payer: 59 | Admitting: Occupational Therapy

## 2015-06-10 DIAGNOSIS — I890 Lymphedema, not elsewhere classified: Secondary | ICD-10-CM | POA: Diagnosis not present

## 2015-06-10 NOTE — Therapy (Signed)
[place] MAIN [facility] SERVICES [address] [place], [place], [address] Phone: [phone]   Fax:  [phone]  Occupational Therapy Treatment  Patient Details  Name: [name] MRN: [id number] Date of Birth: [birth year] Referring Provider:  Maury Dus, MD  Encounter Date: 06/10/2015      OT End of Session - 06/10/15 1644    Visit Number 6   Number of Visits 36   Date for OT Re-Evaluation 08/19/15   OT Start Time 1010   OT Stop Time 1105   OT Time Calculation (min) 55 min   Equipment Utilized During Treatment compression wraps- see TREATMENT secvtion for itemized list   Activity Tolerance Patient tolerated treatment well;Treatment limited secondary to medical complications (Comment)   Behavior During Therapy [facility] for tasks assessed/performed      Past Medical History  Diagnosis Date  . Peptic ulcer     Past Surgical History  Procedure Laterality Date  . Mini gastric bypass    . Cholecystectomy    . Breast surgery    . Knee surgery    . Shoulder surgery      There were no vitals filed for this visit.  Visit Diagnosis:  Lymphedema      Subjective Assessment - 06/10/15 1526    Subjective  Pt presents for visit 6 tIntensive Phase CDT with no new complaints. Pt arrived with compression wraps in place.   Pertinent History osteporosis; s/p gastric bypass 2003 ( s/p ~250# wt loss);  strong + family hx for BLE lymphedema- mother and maternal grandmother; BLE swelling onset early adulthood; frequent falls ( 3-4 in last month with frequent loss of balance   Limitations difficlty walking, standing, lower body dressing , fitting LB clothing and street shoes, transfers, bed moility, bathing, performing hygine, falls, limits participation in home management and productive work/ volunterr activities, limits ability to participate in  sociall family and travel activivities   Patient Stated Goals improve unctional ambulation, ability to  transfer and move around in bed, and increase activity level w/ less sedentary hours ( Current Level of satiscation with performance of all activities rated as 10 on 1-10 scale w/ 1 most satified and 10 least satisfied)most dissatisfied) on 1-10 scall                      OT Treatments/Exercises (OP) - 06/10/15 0001    ADLs   ADL Education Given Yes   Manual Therapy   Manual Therapy Edema management;Manual Lymphatic Drainage (MLD);Compression Bandaging   Edema Management RLE responding well to CDT .  Pt compliant w/ all self care protocols during visit intervals   Manual Lymphatic Drainage (MLD) Manual lymph drainage (MLD) in supine utilizing functional inguinal lymph nodes and deep abdominal lymphatics as is customary for non-cancer related lower extremity LE, including bilateral "short neck" sequence, J strokes to sub and supraclavicular LN, deep abdominal pathways, functional inguinal LN, lower extremity proximal to distal w/ emphasis on medial knee bottleneck and politeal LN. Performed fibrosis technique to B maleoli and distal  legs to address fatty fibrosis. Good tolerance.   Compression Bandaging LLE gradient compression wraps applied circumferentially in gradient configuration from toes to groin as follows: toe wrap x1 under cotton stockinett; 8 cm x 1 to foot and ankle, 10 cm x 2, then  12 cm x 2- all layered over .04 x 10 cm and 12 cm Rosidol Soft foam from A-G.  OT Education - 06/10/15 1644    Education provided Yes   Education Details Continued skilled Pt/caregiver Education  And LE ADL training throughout visit for lymphedema self care components, including compression wrapping, compression garment and device wear/care, lymphatic pumping ther ex, simple self-MLD, and skin care.    Person(s) Educated Patient   Methods Explanation;Demonstration;Tactile cues;Verbal cues;Handout   Comprehension Verbalized understanding;Need further instruction              OT Long Term Goals - 05/21/15 1711    OT LONG TERM GOAL #1   Title Pt able to correctly apply gradient compression wraps from toes to groiin with Max assistance from caregiver within 2 weeks for optimal limb volume reduction and LE self management over time.   Baseline dependent   Time 2   Period Weeks   Status New   OT LONG TERM GOAL #2   Title Pt to achieve 20% limb volume reductions bilaterally by DC to limit lymphedema (LE) progressio, to limit infection risk, to decrease falls risk, and to improve functional mobility and ambulation.   Baseline dependent   Time 12   Period Weeks   Status New   OT LONG TERM GOAL #3   Title Pt mod assist and at least 85% compliant with all LE self-care protocols, including simple self-manual lymphatic drainage (MLD), skin care, lymphatic pumping ther ex, and donning/ doffing progression garments.   Baseline dependent   Time 12   Period Weeks   Status New   OT LONG TERM GOAL #4   Title Pt to remain infection free throughout CDT course to limit infection and LE progression.   Baseline dependent   Time 12   Status New   OT LONG TERM GOAL #5   Title During Management Phase CDT Pt to sustain limb volume reductions achieved during Intensive Phase CDT within 5% utilizing LE self-care protocols, appropriate compression garments/ devices, and moderate caregiver assistance.   Baseline dependent   Time 6   Period Months   Status New   Long Term Additional Goals   Additional Long Term Goals Yes   OT LONG TERM GOAL #6   Title Pt satisfaction with ability to perform safe functional ambulation to ienable her to complete food shopping activities to mprove from  level 10 ( totally unsatisfied)  to level 7 on a 1-10 scale with 1 being totally satisfied.   Baseline 10 ( totally unsatisfied on 1-10 scale   Time 12   Period Weeks   Status New   OT LONG TERM GOAL #7   Title Pt satisfaction with ability to perform safe functional mobility for car  transfers, STS transfers, befd mobility, ytub/shower transfers)  to [facility] from  level 10 ( totally unsatisfied)  to level 7 on a 1-10 scale with 1 being totally satisfied.   Baseline 10 ( totally unsatisfied on 1-10 scale   Time 12   Period Weeks   Status New   OT LONG TERM GOAL #8   Title Pt satisfaction with current activity level needed  to participate in socail and community activities, and to complete ADLs, leisure and productive activities without excessive fatigue to mprove from  level 10 ( totally unsatisfied)  to level 7 on a 1-10 scale with 1 being totally satisfied.   Baseline 10 ( totally unsatisfied on 1-10 scale   Time 12   Period Weeks   Status New   OT LONG TERM GOAL  #9   Baseline Pt able to  reduce time being sedentary from 8 waking hours daily to 2 hours daily in order to increase social and community participation to improve overall life satisfaction.   Time 12   Period Weeks   Status New               Plan - 06/10/15 1645    Clinical Impression Statement Pt continues to respond well to LLE CDT as evidenced by ongoing limb volume reductions and gains in self care ptrotocols. 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   Pt will benefit from skilled therapeutic intervention in order to improve on the following deficits (Retired) Abnormal gait;Decreased range of motion;Difficulty walking;Impaired flexibility;Increased edema;Decreased activity tolerance;Decreased knowledge of precautions;Decreased skin integrity;Decreased knowledge of use of DME;Pain;Decreased mobility;Impaired perceived functional ability   Rehab Potential Good   OT Frequency 3x / week   OT Duration 12 weeks   OT Treatment/Interventions Self-care/ADL training;DME and/or AE instruction;Manual lymph drainage;Patient/family education;Compression  bandaging;Therapeutic exercises;Therapeutic activities;Manual Therapy   Consulted and Agree with Plan of Care Patient;Family member/caregiver        Problem List Patient Active Problem List   Diagnosis Date Noted  . Vitamin D deficiency 08/20/2014  . Secondary hyperparathyroidism 08/20/2014    Andrey Spearman, MS, OTR/L, [facility] 06/10/2015 4:49 PM  [place] MAIN [facility] SERVICES [address] [place], [place], [address] Phone: [phone]   Fax:  [phone]

## 2015-06-12 ENCOUNTER — Ambulatory Visit: Payer: 59 | Admitting: Occupational Therapy

## 2015-06-12 DIAGNOSIS — I890 Lymphedema, not elsewhere classified: Secondary | ICD-10-CM | POA: Diagnosis not present

## 2015-06-12 NOTE — Therapy (Signed)
[facility] MAIN [facility] SERVICES [address] [place], [place], [address] Phone: [phone]   Fax:  [phone]  Occupational Therapy Treatment  Patient Details  Name: [name] MRN: [id number] Date of Birth: [birth year] Referring Cain Fitzhenry:  Elias Else, MD  Encounter Date: 06/12/2015      OT End of Session - 06/12/15 1624    Visit Number 7   Number of Visits 36   Date for OT Re-Evaluation 08/19/15   OT Start Time 1005   OT Stop Time 1108   OT Time Calculation (min) 63 min   Equipment Utilized During Treatment compression wraps- see TREATMENT secvtion for itemized list   Activity Tolerance Patient tolerated treatment well;Treatment limited secondary to medical complications (Comment)   Behavior During Therapy [facility] for tasks assessed/performed      Past Medical History  Diagnosis Date  . Peptic ulcer     Past Surgical History  Procedure Laterality Date  . Mini gastric bypass    . Cholecystectomy    . Breast surgery    . Knee surgery    . Shoulder surgery      There were no vitals filed for this visit.  Visit Diagnosis:  Lymphedema      Subjective Assessment - 06/12/15 1620    Subjective  Pt presents for visit 7 tIntensive Phase CDT with no new complaints. Pt arrived with compression wraps in place. Spouse accompanies her today. She reports ongoing compliance between visits w/ LE self care protocols learned so far.   Patient is accompained by: Family member   Pertinent History osteporosis; s/p gastric bypass 2003 ( s/p ~250# wt loss);  strong + family hx for BLE lymphedema- mother and maternal grandmother; BLE swelling onset early adulthood; frequent falls ( 3-4 in last month with frequent loss of balance   Limitations difficlty walking, standing, lower body dressing , fitting LB clothing and street shoes, transfers, bed moility, bathing, performing hygine, falls, limits participation in home management and productive work/  volunterr activities, limits ability to participate in  sociall family and travel activivities   Patient Stated Goals improve unctional ambulation, ability to transfer and move around in bed, and increase activity level w/ less sedentary hours ( Current Level of satiscation with performance of all activities rated as 10 on 1-10 scale w/ 1 most satified and 10 least satisfied)most dissatisfied) on 1-10 scall   Currently in Pain? No/denies                      OT Treatments/Exercises (OP) - 06/12/15 0001    ADLs   ADL Education Given Yes   Manual Therapy   Manual Therapy Edema management;Manual Lymphatic Drainage (MLD);Compression Bandaging   Edema Management Volume reduction and fibrosis softening noted again today since last visit. Limb is responding very well and Pt having no tolerance issues to limit fulll pareticipation in therapy sessions and home program   Manual Lymphatic Drainage (MLD) Manual lymph drainage (MLD) in supine utilizing functional inguinal lymph nodes and deep abdominal lymphatics as is customary for non-cancer related lower extremity LE, including bilateral "short neck" sequence, J strokes to sub and supraclavicular LN, deep abdominal pathways, functional inguinal LN, lower extremity proximal to distal w/ emphasis on medial knee bottleneck and politeal LN. Performed fibrosis technique to B maleoli and distal  legs to address fatty fibrosis. Good tolerance.   Compression Bandaging LLE gradient compression wraps applied circumferentially in gradient configuration from toes to groin as  follows: toe wrap x1 under cotton stockinett; 8 cm x 1 to foot and ankle, 10 cm x 2, then  12 cm x 2- all layered over .04 x 10 cm and 12 cm Rosidol Soft foam from A-G.                OT Education - 06/12/15 1623    Education provided Yes   Education Details Continued skilled Pt/caregiver Education  And LE ADL training throughout visit for lymphedema self care components,  including compression wrapping, compression garment and device wear/care, lymphatic pumping ther ex, simple self-MLD, and skin care.    Person(s) Educated Patient;Spouse   Methods Explanation;Demonstration;Verbal cues;Handout   Comprehension Verbalized understanding;Returned demonstration;Need further instruction;Verbal cues required             OT Long Term Goals - 05/21/15 1711    OT LONG TERM GOAL #1   Title Pt able to correctly apply gradient compression wraps from toes to groiin with Max assistance from caregiver within 2 weeks for optimal limb volume reduction and LE self management over time.   Baseline dependent   Time 2   Period Weeks   Status New   OT LONG TERM GOAL #2   Title Pt to achieve 20% limb volume reductions bilaterally by DC to limit lymphedema (LE) progressio, to limit infection risk, to decrease falls risk, and to improve functional mobility and ambulation.   Baseline dependent   Time 12   Period Weeks   Status New   OT LONG TERM GOAL #3   Title Pt mod assist and at least 85% compliant with all LE self-care protocols, including simple self-manual lymphatic drainage (MLD), skin care, lymphatic pumping ther ex, and donning/ doffing progression garments.   Baseline dependent   Time 12   Period Weeks   Status New   OT LONG TERM GOAL #4   Title Pt to remain infection free throughout CDT course to limit infection and LE progression.   Baseline dependent   Time 12   Status New   OT LONG TERM GOAL #5   Title During Management Phase CDT Pt to sustain limb volume reductions achieved during Intensive Phase CDT within 5% utilizing LE self-care protocols, appropriate compression garments/ devices, and moderate caregiver assistance.   Baseline dependent   Time 6   Period Months   Status New   Long Term Additional Goals   Additional Long Term Goals Yes   OT LONG TERM GOAL #6   Title Pt satisfaction with ability to perform safe functional ambulation to ienable her  to complete food shopping activities to mprove from  level 10 ( totally unsatisfied)  to level 7 on a 1-10 scale with 1 being totally satisfied.   Baseline 10 ( totally unsatisfied on 1-10 scale   Time 12   Period Weeks   Status New   OT LONG TERM GOAL #7   Title Pt satisfaction with ability to perform safe functional mobility for car transfers, STS transfers, befd mobility, ytub/shower transfers)  to [facility] from  level 10 ( totally unsatisfied)  to level 7 on a 1-10 scale with 1 being totally satisfied.   Baseline 10 ( totally unsatisfied on 1-10 scale   Time 12   Period Weeks   Status New   OT LONG TERM GOAL #8   Title Pt satisfaction with current activity level needed  to participate in socail and community activities, and to complete ADLs, leisure and productive activities without excessive fatigue to  mprove from  level 10 ( totally unsatisfied)  to level 7 on a 1-10 scale with 1 being totally satisfied.   Baseline 10 ( totally unsatisfied on 1-10 scale   Time 12   Period Weeks   Status New   OT LONG TERM GOAL  #9   Baseline Pt able to reduce time being sedentary from 8 waking hours daily to 2 hours daily in order to increase social and community participation to improve overall life satisfaction.   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 Ankle is reduced, but remains stubborn to tissue changes.    Pt will benefit from skilled therapeutic intervention in order to improve on the following deficits (Retired) Abnormal gait;Decreased range of motion;Difficulty walking;Impaired flexibility;Increased edema;Decreased activity tolerance;Decreased knowledge of precautions;Decreased skin integrity;Decreased knowledge of use of DME;Pain;Decreased mobility;Impaired perceived  functional ability   Rehab Potential Good   OT Frequency 3x / week   OT Duration 12 weeks   OT Treatment/Interventions Self-care/ADL training;DME and/or AE instruction;Manual lymph drainage;Patient/family education;Compression bandaging;Therapeutic exercises;Therapeutic activities;Manual Therapy   Consulted and Agree with Plan of Care Patient;Family member/caregiver        Problem List Patient Active Problem List   Diagnosis Date Noted  . Vitamin D deficiency 08/20/2014  . Secondary hyperparathyroidism 08/20/2014   Loel Dubonnet, MS, OTR/L, [facility], [place] 06/12/2015 4:27 PM   [place] [facility] MAIN [facility] SERVICES [address] [place], [place], [address] Phone: [phone]   Fax:  [phone]

## 2015-06-12 NOTE — Patient Instructions (Signed)
As established. See initial evaluation.   

## 2015-06-14 ENCOUNTER — Ambulatory Visit: Payer: 59 | Admitting: Occupational Therapy

## 2015-06-14 DIAGNOSIS — I890 Lymphedema, not elsewhere classified: Secondary | ICD-10-CM | POA: Diagnosis not present

## 2015-06-14 NOTE — Therapy (Signed)
[place] MAIN [facility] SERVICES [address] [place], [place], [address] Phone: [phone]   Fax:  [phone]  Occupational Therapy Treatment  Patient Details  Name: [name] MRN: [id number] Date of Birth: 09-20-1950 Referring Provider:  Maury Dus, MD  Encounter Date: 06/14/2015      OT End of Session - 06/14/15 1419    Visit Number 8   Number of Visits 36   Date for OT Re-Evaluation 08/19/15   OT Start Time 1045   OT Stop Time 1150   OT Time Calculation (min) 65 min   Equipment Utilized During Treatment compression wraps- see TREATMENT secvtion for itemized list   Activity Tolerance Patient tolerated treatment well;No increased pain   Behavior During Therapy [facility] for tasks assessed/performed      Past Medical History  Diagnosis Date  . Peptic ulcer     Past Surgical History  Procedure Laterality Date  . Mini gastric bypass    . Cholecystectomy    . Breast surgery    . Knee surgery    . Shoulder surgery      There were no vitals filed for this visit.  Visit Diagnosis:  Lymphedema      Subjective Assessment - 06/14/15 1402    Subjective  Pt presents for visit 8 tIntensive Phase CDT to BLE w/ initial Rx to RLE. Pt is very plased with limb volume reduction thus far. She reports improved ability to ambulate, move in bed and get in and out of the car.   Patient is accompained by: Family member   Pertinent History osteporosis; s/p gastric bypass 2003 ( s/p ~250# wt loss);  strong + family hx for BLE lymphedema- mother and maternal grandmother; BLE swelling onset early adulthood; frequent falls ( 3-4 in last month with frequent loss of balance   Limitations difficlty walking, standing, lower body dressing , fitting LB clothing and street shoes, transfers, bed moility, bathing, performing hygine, falls, limits participation in home management and productive work/ volunterr activities, limits ability to participate in  sociall  family and travel activivities   Patient Stated Goals improve unctional ambulation, ability to transfer and move around in bed, and increase activity level w/ less sedentary hours ( Current Level of satiscation with performance of all activities rated as 10 on 1-10 scale w/ 1 most satified and 10 least satisfied)most dissatisfied) on 1-10 scall   Currently in Pain? No/denies             LYMPHEDEMA/ONCOLOGY QUESTIONNAIRE - 06/14/15 1404    Right Lower Extremity Lymphedema   Other RLE A-G limb vol (toes to groin) =9674.23ml reveals limb volume reduction of 13.67% since commencing LLE CDT on 05/22/15. Excellent progress towards 20% bilateral oal.   Other LVD toes to groin(A_G)=16.0%, L>R                 OT Treatments/Exercises (OP) - 06/14/15 0001    ADLs   ADL Education Given Yes   Manual Therapy   Manual Therapy Edema management;Manual Lymphatic Drainage (MLD);Compression Bandaging   Edema Management Comparative volumetrics today to check progress towards  goals   Manual Lymphatic Drainage (MLD) Manual lymph drainage (MLD) in supine utilizing functional inguinal lymph nodes and deep abdominal lymphatics as is customary for non-cancer related lower extremity LE, including bilateral "short neck" sequence, J strokes to sub and supraclavicular LN, deep abdominal pathways, functional inguinal LN, lower extremity proximal to distal w/ emphasis on medial knee bottleneck and politeal LN.  Performed fibrosis technique to B maleoli and distal  legs to address fatty fibrosis. Good tolerance.   Compression Bandaging LLE gradient compression wraps applied circumferentially in gradient configuration from toes to groin as follows: toe wrap x1 under cotton stockinett; 8 cm x 1 to foot and ankle, 10 cm x 2, then  12 cm x 2- all layered over .04 x 10 cm and 12 cm Rosidol Soft foam from A-G.                OT Education - 06/14/15 1415    Education provided Yes   Education Details Emphasis of  skilled LE self-care training today on  simple self-MLD. Pt has mastered J stroke. She completed entire leg sequence with max assist and cues. Technique will improve with practice.   Person(s) Educated Patient;Spouse   Methods Explanation;Demonstration;Tactile cues;Verbal cues;Handout   Comprehension Verbalized understanding;Returned demonstration;Verbal cues required;Tactile cues required;Need further instruction             OT Long Term Goals - 06/14/15 1415    OT LONG TERM GOAL #1   Title Pt able to correctly apply gradient compression wraps from toes to groiin with Max assistance from caregiver within 2 weeks for optimal limb volume reduction and LE self management over time.- Pt is independent w/ compression wraps. No CG assistance required.   Baseline dependent   Time 2   Period Weeks   Status Achieved   OT LONG TERM GOAL #2   Title Pt to achieve 20% limb volume reductions bilaterally by DC to limit lymphedema (LE) progressio, to limit infection risk, to decrease falls risk, and to improve functional mobility and ambulation.-Partially met on LLE w/ 13.67% limb volume reduction measured today.   Baseline dependent   Time 12   Period Weeks   Status Partially Met   OT LONG TERM GOAL #3   Title Pt mod assist and at least 85% compliant with all LE self-care protocols, including simple self-manual lymphatic drainage (MLD), skin care, lymphatic pumping ther ex, and donning/ doffing progression garments.- Pt still learning protocols but shows excellent progress with skin care, wraps, simple self MLD, and ther ex.   Baseline dependent   Time 12   Period Weeks   Status Partially Met   OT LONG TERM GOAL #4   Title Pt to remain infection free throughout CDT course to limit infection and LE progression.   Baseline dependent   Time 12   Status Partially Met   OT LONG TERM GOAL #5   Title During Management Phase CDT Pt to sustain limb volume reductions achieved during Intensive Phase CDT  within 5% utilizing LE self-care protocols, appropriate compression garments/ devices, and moderate caregiver assistance.   Baseline dependent   Time 6   Period Months   Status New   OT LONG TERM GOAL #6   Title Pt satisfaction with ability to perform safe functional ambulation to ienable her to complete food shopping activities to mprove from  level 10 ( totally unsatisfied)  to level 7 on a 1-10 scale with 1 being totally satisfied.   Baseline 10 ( totally unsatisfied on 1-10 scale   Time 12   Period Weeks   Status New   OT LONG TERM GOAL #7   Title Pt satisfaction with ability to perform safe functional mobility for car transfers, STS transfers, befd mobility, ytub/shower transfers)  to [facility] from  level 10 ( totally unsatisfied)  to level 7 on a 1-10 scale with 1  being totally satisfied.   Baseline 10 ( totally unsatisfied on 1-10 scale   Time 12   Period Weeks   Status New   OT LONG TERM GOAL #8   Title Pt satisfaction with current activity level needed  to participate in socail and community activities, and to complete ADLs, leisure and productive activities without excessive fatigue to mprove from  level 10 ( totally unsatisfied)  to level 7 on a 1-10 scale with 1 being totally satisfied.   Baseline 10 ( totally unsatisfied on 1-10 scale   Time 12   Period Weeks   Status New   OT LONG TERM GOAL  #9   Baseline Pt able to reduce time being sedentary from 8 waking hours daily to 2 hours daily in order to increase social and community participation to improve overall life satisfaction.   Time 12   Period Weeks   Status New               Plan - 06/14/15 1409    Clinical Impression Statement Pt demonstrates excellent progress towards OT goals for RLE LE. RLE  limb vol from A-G (toes to groin) =9674.0 ml , which reveals limb volume reduction measuring 13.67% since commencing LLE CDT on 05/22/15. This value meats the 10% volume reduction goal. Pt is diligent w/ LE self care  protocols learned so far during visit intervals. Tolerance for CDT is excellent.   Pt will benefit from skilled therapeutic intervention in order to improve on the following deficits (Retired) Abnormal gait;Decreased range of motion;Difficulty walking;Impaired flexibility;Increased edema;Decreased activity tolerance;Decreased knowledge of precautions;Decreased skin integrity;Decreased knowledge of use of DME;Pain;Decreased mobility;Impaired perceived functional ability   Rehab Potential Good   OT Frequency 3x / week   OT Duration 12 weeks   OT Treatment/Interventions Self-care/ADL training;DME and/or AE instruction;Manual lymph drainage;Patient/family education;Compression bandaging;Therapeutic exercises;Therapeutic activities;Manual Therapy   Consulted and Agree with Plan of Care Patient;Family member/caregiver        Problem List Patient Active Problem List   Diagnosis Date Noted  . Vitamin D deficiency 08/20/2014  . Secondary hyperparathyroidism 08/20/2014    Andrey Spearman, MS, OTR/L, [facility] 06/14/2015 2:22 PM   [place] MAIN [facility] SERVICES [address] [place], [place], [address] Phone: [phone]   Fax:  [phone]

## 2015-06-14 NOTE — Patient Instructions (Signed)
As established. See initial evaluation.   

## 2015-06-17 ENCOUNTER — Ambulatory Visit: Payer: 59 | Attending: Family Medicine | Admitting: Occupational Therapy

## 2015-06-17 DIAGNOSIS — I890 Lymphedema, not elsewhere classified: Secondary | ICD-10-CM | POA: Diagnosis not present

## 2015-06-17 NOTE — Therapy (Signed)
[place] [facility] MAIN [facility] SERVICES [address] [place], [place], [address] Phone: [phone]   Fax:  [phone]  Occupational Therapy Treatment  Patient Details  Name: [name] MRN: [id number] Date of Birth: [birth year] Referring Provider:  Elias Else, MD  Encounter Date: 06/17/2015      OT End of Session - 06/17/15 1222    Visit Number 9   Number of Visits 36   Date for OT Re-Evaluation 08/19/15   OT Start Time 1004   OT Stop Time 1057   OT Time Calculation (min) 53 min   Equipment Utilized During Treatment compression wraps- see TREATMENT secvtion for itemized list   Activity Tolerance Patient tolerated treatment well;No increased pain   Behavior During Therapy [facility] for tasks assessed/performed      Past Medical History  Diagnosis Date  . Peptic ulcer     Past Surgical History  Procedure Laterality Date  . Mini gastric bypass    . Cholecystectomy    . Breast surgery    . Knee surgery    . Shoulder surgery      There were no vitals filed for this visit.  Visit Diagnosis:  Lymphedema      Subjective Assessment - 06/17/15 1220    Subjective  Pt presents for visit 9  tIntensive Phase CDT to BLE w/ initial Rx to RLE. Pt is very plased with limb volume reduction thus far. She reports improved ability to climb steps to enter and exit friends home over the weekend. "I went right on up!! Usually my husband has to get behind me and push me."   Patient is accompained by: Family member   Pertinent History osteporosis; s/p gastric bypass 2003 ( s/p ~250# wt loss);  strong + family hx for BLE lymphedema- mother and maternal grandmother; BLE swelling onset early adulthood; frequent falls ( 3-4 in last month with frequent loss of balance   Limitations difficlty walking, standing, lower body dressing , fitting LB clothing and street shoes, transfers, bed moility, bathing, performing hygine, falls, limits participation in home management  and productive work/ volunterr activities, limits ability to participate in  sociall family and travel activivities   Patient Stated Goals improve unctional ambulation, ability to transfer and move around in bed, and increase activity level w/ less sedentary hours ( Current Level of satiscation with performance of all activities rated as 10 on 1-10 scale w/ 1 most satified and 10 least satisfied)most dissatisfied) on 1-10 scall                              OT Education - 06/17/15 1221    Education provided Yes   Education Details Continued skilled Pt/caregiver Education  And LE ADL training throughout visit for lymphedema self care components, including compression wrapping, compression garment and device wear/care, lymphatic pumping ther ex, simple self-MLD, and skin care.    Person(s) Educated Patient;Spouse   Methods Demonstration;Explanation;Verbal cues   Comprehension Verbalized understanding;Returned demonstration;Need further instruction             OT Long Term Goals - 06/14/15 1415    OT LONG TERM GOAL #1   Title Pt able to correctly apply gradient compression wraps from toes to groiin with Max assistance from caregiver within 2 weeks for optimal limb volume reduction and LE self management over time.- Pt is independent w/ compression wraps. No CG assistance required.   Baseline dependent  Time 2   Period Weeks   Status Achieved   OT LONG TERM GOAL #2   Title Pt to achieve 20% limb volume reductions bilaterally by DC to limit lymphedema (LE) progressio, to limit infection risk, to decrease falls risk, and to improve functional mobility and ambulation.-Partially met on LLE w/ 13.67% limb volume reduction measured today.   Baseline dependent   Time 12   Period Weeks   Status Partially Met   OT LONG TERM GOAL #3   Title Pt mod assist and at least 85% compliant with all LE self-care protocols, including simple self-manual lymphatic drainage (MLD), skin  care, lymphatic pumping ther ex, and donning/ doffing progression garments.- Pt still learning protocols but shows excellent progress with skin care, wraps, simple self MLD, and ther ex.   Baseline dependent   Time 12   Period Weeks   Status Partially Met   OT LONG TERM GOAL #4   Title Pt to remain infection free throughout CDT course to limit infection and LE progression.   Baseline dependent   Time 12   Status Partially Met   OT LONG TERM GOAL #5   Title During Management Phase CDT Pt to sustain limb volume reductions achieved during Intensive Phase CDT within 5% utilizing LE self-care protocols, appropriate compression garments/ devices, and moderate caregiver assistance.   Baseline dependent   Time 6   Period Months   Status New   OT LONG TERM GOAL #6   Title Pt satisfaction with ability to perform safe functional ambulation to ienable her to complete food shopping activities to mprove from  level 10 ( totally unsatisfied)  to level 7 on a 1-10 scale with 1 being totally satisfied.   Baseline 10 ( totally unsatisfied on 1-10 scale   Time 12   Period Weeks   Status New   OT LONG TERM GOAL #7   Title Pt satisfaction with ability to perform safe functional mobility for car transfers, STS transfers, befd mobility, ytub/shower transfers)  to [facility] from  level 10 ( totally unsatisfied)  to level 7 on a 1-10 scale with 1 being totally satisfied.   Baseline 10 ( totally unsatisfied on 1-10 scale   Time 12   Period Weeks   Status New   OT LONG TERM GOAL #8   Title Pt satisfaction with current activity level needed  to participate in socail and community activities, and to complete ADLs, leisure and productive activities without excessive fatigue to mprove from  level 10 ( totally unsatisfied)  to level 7 on a 1-10 scale with 1 being totally satisfied.   Baseline 10 ( totally unsatisfied on 1-10 scale   Time 12   Period Weeks   Status New   OT LONG TERM GOAL  #9   Baseline Pt able to  reduce time being sedentary from 8 waking hours daily to 2 hours daily in order to increase social and community participation to improve overall life satisfaction.   Time 12   Period Weeks   Status New               Plan - 06/17/15 1222    Clinical Impression Statement LLE presents with mild increase in swelling and tissue density today, which may be partially due to high salt intake over the weekend. Volume reduction and skin condition continue to respond to CDT. Pt diligent w/ self care between visits is having a very positive impactr on outcomes thus far.   Pt will  benefit from skilled therapeutic intervention in order to improve on the following deficits (Retired) Abnormal gait;Decreased range of motion;Difficulty walking;Impaired flexibility;Increased edema;Decreased activity tolerance;Decreased knowledge of precautions;Decreased skin integrity;Decreased knowledge of use of DME;Pain;Decreased mobility;Impaired perceived functional ability   Rehab Potential Good   OT Frequency 3x / week   OT Duration 12 weeks   OT Treatment/Interventions Self-care/ADL training;DME and/or AE instruction;Manual lymph drainage;Patient/family education;Compression bandaging;Therapeutic exercises;Therapeutic activities;Manual Therapy   Consulted and Agree with Plan of Care Patient;Family member/caregiver        Problem List Patient Active Problem List   Diagnosis Date Noted  . Vitamin D deficiency 08/20/2014  . Secondary hyperparathyroidism ([place]) 08/20/2014    Andrey Spearman, MS, OTR/L, [facility] 06/17/2015 12:25 PM   [place] MAIN [facility] SERVICES [address] [place], [place], [address] Phone: [phone]   Fax:  [phone]

## 2015-06-19 ENCOUNTER — Ambulatory Visit: Payer: 59 | Admitting: Occupational Therapy

## 2015-06-19 DIAGNOSIS — I890 Lymphedema, not elsewhere classified: Secondary | ICD-10-CM | POA: Diagnosis not present

## 2015-06-19 NOTE — Patient Instructions (Signed)
LE instructions and precautions as established- see initial eval.   

## 2015-06-19 NOTE — Therapy (Signed)
[place] MAIN [facility] SERVICES [address] [place], [place], [address] Phone: [phone]   Fax:  [phone]  Occupational Therapy Treatment  Patient Details  Name: [name] MRN: [id number] Date of Birth: 06-28-1951 Referring Provider:  Maury Dus, MD  Encounter Date: 06/19/2015      OT End of Session - 06/19/15 1215    Visit Number 10   Number of Visits 36   Date for OT Re-Evaluation 08/19/15   OT Start Time 1000   OT Stop Time 1105   OT Time Calculation (min) 65 min   Equipment Utilized During Treatment compression wraps- see TREATMENT secvtion for itemized list   Activity Tolerance Patient tolerated treatment well;No increased pain   Behavior During Therapy [facility] for tasks assessed/performed      Past Medical History  Diagnosis Date  . Peptic ulcer     Past Surgical History  Procedure Laterality Date  . Mini gastric bypass    . Cholecystectomy    . Breast surgery    . Knee surgery    . Shoulder surgery      There were no vitals filed for this visit.  Visit Diagnosis:  Lymphedema      Subjective Assessment - 06/19/15 1211    Subjective  Pt presents for visit 10  tIntensive Phase CDT to BLE w/ initial Rx to RLE. Pt has no new complaints or concerns. She reports that she is unable to go to the beach as planned 2/2 bad weather, but was unable to get any cancelled visits back 2/2 limited OT availability. Pt states she feels comfortable with all LE self care regimes during week long interval. She agrees to call PRN.   Patient is accompained by: Family member   Pertinent History osteporosis; s/p gastric bypass 2003 ( s/p ~250# wt loss);  strong + family hx for BLE lymphedema- mother and maternal grandmother; BLE swelling onset early adulthood; frequent falls ( 3-4 in last month with frequent loss of balance   Limitations difficlty walking, standing, lower body dressing , fitting LB clothing and street shoes, transfers, bed  moility, bathing, performing hygine, falls, limits participation in home management and productive work/ volunterr activities, limits ability to participate in  sociall family and travel activivities   Patient Stated Goals improve unctional ambulation, ability to transfer and move around in bed, and increase activity level w/ less sedentary hours ( Current Level of satiscation with performance of all activities rated as 10 on 1-10 scale w/ 1 most satified and 10 least satisfied)most dissatisfied) on 1-10 scall                      OT Treatments/Exercises (OP) - 06/19/15 0001    ADLs   ADL Education Given Yes   Manual Therapy   Manual Therapy Edema management;Manual Lymphatic Drainage (MLD);Compression Bandaging   Manual therapy comments skin care w/ low pH Eucerin lotion during MLD   Manual Lymphatic Drainage (MLD) Manual lymph drainage (MLD) in supine utilizing functional inguinal lymph nodes and deep abdominal lymphatics as is customary for non-cancer related lower extremity LE, including bilateral "short neck" sequence, J strokes to sub and supraclavicular LN, deep abdominal pathways, functional inguinal LN, lower extremity proximal to distal w/ emphasis on medial knee bottleneck and politeal LN. Performed fibrosis technique to B maleoli and distal  legs to address fatty fibrosis. Good tolerance.   Compression Bandaging LLE gradient compression wraps applied circumferentially in gradient configuration from toes  to groin as follows: toe wrap x1 under cotton stockinett; 8 cm x 1 to foot and ankle, 10 cm x 2, then  12 cm x 2- all layered over .04 x 10 cm and 12 cm Rosidol Soft foam from A-G.                OT Education - 06/19/15 1214    Education provided Yes   Education Details Continued skilled Pt/caregiver Education  And LE ADL training throughout visit for lymphedema self care components, including compression wrapping, compression garment and device wear/care, lymphatic  pumping ther ex, simple self-MLD, and skin care.    Person(s) Educated Patient   Methods Explanation;Demonstration   Comprehension Verbalized understanding;Need further instruction             OT Long Term Goals - 06/14/15 1415    OT LONG TERM GOAL #1   Title Pt able to correctly apply gradient compression wraps from toes to groiin with Max assistance from caregiver within 2 weeks for optimal limb volume reduction and LE self management over time.- Pt is independent w/ compression wraps. No CG assistance required.   Baseline dependent   Time 2   Period Weeks   Status Achieved   OT LONG TERM GOAL #2   Title Pt to achieve 20% limb volume reductions bilaterally by DC to limit lymphedema (LE) progressio, to limit infection risk, to decrease falls risk, and to improve functional mobility and ambulation.-Partially met on LLE w/ 13.67% limb volume reduction measured today.   Baseline dependent   Time 12   Period Weeks   Status Partially Met   OT LONG TERM GOAL #3   Title Pt mod assist and at least 85% compliant with all LE self-care protocols, including simple self-manual lymphatic drainage (MLD), skin care, lymphatic pumping ther ex, and donning/ doffing progression garments.- Pt still learning protocols but shows excellent progress with skin care, wraps, simple self MLD, and ther ex.   Baseline dependent   Time 12   Period Weeks   Status Partially Met   OT LONG TERM GOAL #4   Title Pt to remain infection free throughout CDT course to limit infection and LE progression.   Baseline dependent   Time 12   Status Partially Met   OT LONG TERM GOAL #5   Title During Management Phase CDT Pt to sustain limb volume reductions achieved during Intensive Phase CDT within 5% utilizing LE self-care protocols, appropriate compression garments/ devices, and moderate caregiver assistance.   Baseline dependent   Time 6   Period Months   Status New   OT LONG TERM GOAL #6   Title Pt satisfaction  with ability to perform safe functional ambulation to ienable her to complete food shopping activities to mprove from  level 10 ( totally unsatisfied)  to level 7 on a 1-10 scale with 1 being totally satisfied.   Baseline 10 ( totally unsatisfied on 1-10 scale   Time 12   Period Weeks   Status New   OT LONG TERM GOAL #7   Title Pt satisfaction with ability to perform safe functional mobility for car transfers, STS transfers, befd mobility, ytub/shower transfers)  to [facility] from  level 10 ( totally unsatisfied)  to level 7 on a 1-10 scale with 1 being totally satisfied.   Baseline 10 ( totally unsatisfied on 1-10 scale   Time 12   Period Weeks   Status New   OT LONG TERM GOAL #8   Title  Pt satisfaction with current activity level needed  to participate in socail and community activities, and to complete ADLs, leisure and productive activities without excessive fatigue to mprove from  level 10 ( totally unsatisfied)  to level 7 on a 1-10 scale with 1 being totally satisfied.   Baseline 10 ( totally unsatisfied on 1-10 scale   Time 12   Period Weeks   Status New   OT LONG TERM GOAL  #9   Baseline Pt able to reduce time being sedentary from 8 waking hours daily to 2 hours daily in order to increase social and community participation to improve overall life satisfaction.   Time 12   Period Weeks   Status New               Plan - 06/19/15 1216    Clinical Impression Statement LLE increased dpreprensity and swelling noted last visit is visibly and palpably reduced today to level noted late last week. Pt knows of no known exacerbating event other than eating salty foods. Pt feels confident that she will be able to perform all LE self care protocols during 1 week break from OT. She'll call PRN.   Pt will benefit from skilled therapeutic intervention in order to improve on the following deficits (Retired) Abnormal gait;Decreased range of motion;Difficulty walking;Impaired  flexibility;Increased edema;Decreased activity tolerance;Decreased knowledge of precautions;Decreased skin integrity;Decreased knowledge of use of DME;Pain;Decreased mobility;Impaired perceived functional ability   Rehab Potential Good   OT Frequency 3x / week   OT Duration 12 weeks   OT Treatment/Interventions Self-care/ADL training;DME and/or AE instruction;Manual lymph drainage;Patient/family education;Compression bandaging;Therapeutic exercises;Therapeutic activities;Manual Therapy   Consulted and Agree with Plan of Care Patient;Family member/caregiver        Problem List Patient Active Problem List   Diagnosis Date Noted  . Vitamin D deficiency 08/20/2014  . Secondary hyperparathyroidism ([place]) 08/20/2014   Andrey Spearman, MS, OTR/L, [facility] 06/19/2015 12:21 PM   [place] MAIN [facility] SERVICES [address] [place], [place], [address] Phone: [phone]   Fax:  [phone]

## 2015-06-21 ENCOUNTER — Ambulatory Visit: Payer: 59 | Admitting: Occupational Therapy

## 2015-06-24 ENCOUNTER — Ambulatory Visit: Payer: 59 | Admitting: Occupational Therapy

## 2015-06-26 ENCOUNTER — Ambulatory Visit: Payer: 59 | Admitting: Occupational Therapy

## 2015-06-27 ENCOUNTER — Encounter: Payer: 59 | Admitting: Occupational Therapy

## 2015-06-28 ENCOUNTER — Ambulatory Visit: Payer: 59 | Admitting: Occupational Therapy

## 2015-06-28 DIAGNOSIS — I890 Lymphedema, not elsewhere classified: Secondary | ICD-10-CM

## 2015-06-28 NOTE — Therapy (Signed)
[place] MAIN [facility] SERVICES [address] [place], [place], [address] Phone: [phone]   Fax:  [phone]  Occupational Therapy Treatment  Patient Details  Name: [name] MRN: [id number] Date of Birth: 1950-11-29 No Data Recorded  Encounter Date: 06/28/2015      OT End of Session - 06/28/15 1653    Visit Number 11   Number of Visits 36   Date for OT Re-Evaluation 08/19/15   OT Start Time 0904   OT Stop Time 1005   OT Time Calculation (min) 61 min   Equipment Utilized During Treatment compression wraps- see TREATMENT secvtion for itemized list   Activity Tolerance Patient tolerated treatment well;No increased pain   Behavior During Therapy [facility] for tasks assessed/performed      Past Medical History  Diagnosis Date  . Peptic ulcer     Past Surgical History  Procedure Laterality Date  . Mini gastric bypass    . Cholecystectomy    . Breast surgery    . Knee surgery    . Shoulder surgery      There were no vitals filed for this visit.  Visit Diagnosis:  Lymphedema      Subjective Assessment - 06/28/15 1019    Subjective  Pt presents for visit 11  tIntensive Phase CDT to BLE w/ initial Rx to RLE. Manufacturer's rep from [employer]  is here to assist w/ Pt edu and trial w/ Flexitouch sequential pneumatic lymphedema pump.   Patient is accompained by: Family member   Pertinent History osteporosis; s/p gastric bypass 2003 ( s/p ~250# wt loss);  strong + family hx for BLE lymphedema- mother and maternal grandmother; BLE swelling onset early adulthood; frequent falls ( 3-4 in last month with frequent loss of balance   Limitations difficlty walking, standing, lower body dressing , fitting LB clothing and street shoes, transfers, bed moility, bathing, performing hygine, falls, limits participation in home management and productive work/ volunterr activities, limits ability to participate in  sociall family and travel  activivities   Patient Stated Goals improve unctional ambulation, ability to transfer and move around in bed, and increase activity level w/ less sedentary hours ( Current Level of satiscation with performance of all activities rated as 10 on 1-10 scale w/ 1 most satified and 10 least satisfied)most dissatisfied) on 1-10 scall   Currently in Pain? No/denies                      OT Treatments/Exercises (OP) - 06/28/15 0001    Manual Therapy   Manual Therapy Edema management;Compression Bandaging   Compression Bandaging LLE gradient compression wraps applied circumferentially in gradient configuration from toes to groin as follows: toe wrap x1 under cotton stockinett; 8 cm x 1 to foot and ankle, 10 cm x 2, then  12 cm x 2- all layered over .04 x 10 cm and 12 cm Rosidol Soft foam from A-G.                OT Education - 06/28/15 1034    Education provided Yes   Education Details Pt and caregiver edu today for benefits, contraindications, and care and use routines forsequential pneumatic compression pump needed to assist w/ lymphedema self care  management home program  since Pt is unable to perform simple self MLD effectively w/ contracted R hand s/p fracture injury.   Person(s) Educated Patient;Spouse   Methods Explanation;Demonstration;Verbal cues;Handout   Comprehension Verbalized understanding;Need further instruction  OT Long Term Goals - 06/14/15 1415    OT LONG TERM GOAL #1   Title Pt able to correctly apply gradient compression wraps from toes to groiin with Max assistance from caregiver within 2 weeks for optimal limb volume reduction and LE self management over time.- Pt is independent w/ compression wraps. No CG assistance required.   Baseline dependent   Time 2   Period Weeks   Status Achieved   OT LONG TERM GOAL #2   Title Pt to achieve 20% limb volume reductions bilaterally by DC to limit lymphedema (LE) progressio, to limit infection  risk, to decrease falls risk, and to improve functional mobility and ambulation.-Partially met on LLE w/ 13.67% limb volume reduction measured today.   Baseline dependent   Time 12   Period Weeks   Status Partially Met   OT LONG TERM GOAL #3   Title Pt mod assist and at least 85% compliant with all LE self-care protocols, including simple self-manual lymphatic drainage (MLD), skin care, lymphatic pumping ther ex, and donning/ doffing progression garments.- Pt still learning protocols but shows excellent progress with skin care, wraps, simple self MLD, and ther ex.   Baseline dependent   Time 12   Period Weeks   Status Partially Met   OT LONG TERM GOAL #4   Title Pt to remain infection free throughout CDT course to limit infection and LE progression.   Baseline dependent   Time 12   Status Partially Met   OT LONG TERM GOAL #5   Title During Management Phase CDT Pt to sustain limb volume reductions achieved during Intensive Phase CDT within 5% utilizing LE self-care protocols, appropriate compression garments/ devices, and moderate caregiver assistance.   Baseline dependent   Time 6   Period Months   Status New   OT LONG TERM GOAL #6   Title Pt satisfaction with ability to perform safe functional ambulation to ienable her to complete food shopping activities to mprove from  level 10 ( totally unsatisfied)  to level 7 on a 1-10 scale with 1 being totally satisfied.   Baseline 10 ( totally unsatisfied on 1-10 scale   Time 12   Period Weeks   Status New   OT LONG TERM GOAL #7   Title Pt satisfaction with ability to perform safe functional mobility for car transfers, STS transfers, befd mobility, ytub/shower transfers)  to [facility] from  level 10 ( totally unsatisfied)  to level 7 on a 1-10 scale with 1 being totally satisfied.   Baseline 10 ( totally unsatisfied on 1-10 scale   Time 12   Period Weeks   Status New   OT LONG TERM GOAL #8   Title Pt satisfaction with current activity level  needed  to participate in socail and community activities, and to complete ADLs, leisure and productive activities without excessive fatigue to mprove from  level 10 ( totally unsatisfied)  to level 7 on a 1-10 scale with 1 being totally satisfied.   Baseline 10 ( totally unsatisfied on 1-10 scale   Time 12   Period Weeks   Status New   OT LONG TERM GOAL  #9   Baseline Pt able to reduce time being sedentary from 8 waking hours daily to 2 hours daily in order to increase social and community participation to improve overall life satisfaction.   Time 12   Period Weeks   Status New  Plan - 06/28/15 1045    Clinical Impression Statement Pt presents with increased LLE swelling today as she was ill for several days and unable to wrap or perform sinple self MLD during extended visit interval. due to hand contracture.  Pt completed trial with Flexitouch system and tolerated it well with visible limb volume reduction in the LLE from toes to groin. Pt and spouse verbalized understanding of  benefits, contraindications, and care and use routines forsequential pneumatic compression devices after skilled training. The Flexitouch is medically necessary in this case  to assist w/ BLE lymphedema self care and optimal management at home over time as Pt is unable to perform simple self MLD effectively w/ contracted R hand  contracture. The abdominal component is necessary to move lymphatic congestion from BLE into deep abdominal collectors and into the thoracic duct to limit genital  and abdominal swelling and increased proximal fibrosis leading to progression..   Pt will benefit from skilled therapeutic intervention in order to improve on the following deficits (Retired) Abnormal gait;Decreased range of motion;Difficulty walking;Impaired flexibility;Increased edema;Decreased activity tolerance;Decreased knowledge of precautions;Decreased skin integrity;Decreased knowledge of use of  DME;Pain;Decreased mobility;Impaired perceived functional ability   Rehab Potential Good   OT Frequency 3x / week   OT Duration 12 weeks   OT Treatment/Interventions Self-care/ADL training;DME and/or AE instruction;Manual lymph drainage;Patient/family education;Compression bandaging;Therapeutic exercises;Therapeutic activities;Manual Therapy   Consulted and Agree with Plan of Care Patient;Family member/caregiver        Problem List Patient Active Problem List   Diagnosis Date Noted  . Vitamin D deficiency 08/20/2014  . Secondary hyperparathyroidism ([place]) 08/20/2014    Andrey Spearman, MS, OTR/L, [facility] 06/28/2015 5:02 PM   [place] MAIN [facility] SERVICES [address] [place], [place], [address] Phone: [phone]   Fax:  [phone]  Name: [name] MRN: [id number] Date of Birth: 08/29/1951

## 2015-06-28 NOTE — Patient Instructions (Signed)
LE instructions and precautions as established- see initial eval.   

## 2015-07-01 ENCOUNTER — Ambulatory Visit: Payer: 59 | Admitting: Occupational Therapy

## 2015-07-01 DIAGNOSIS — I890 Lymphedema, not elsewhere classified: Secondary | ICD-10-CM

## 2015-07-01 NOTE — Therapy (Signed)
[place] MAIN [facility] SERVICES [address] [place], [place], [address] Phone: [phone]   Fax:  [phone]  Occupational Therapy Treatment  Patient Details  Name: [name] MRN: [id number] Date of Birth: [birth year] No Data Recorded  Encounter Date: 07/01/2015      OT End of Session - 07/01/15 1223    Visit Number 12   Number of Visits 36   Date for OT Re-Evaluation 08/19/15   OT Start Time 1108   OT Stop Time 1203   OT Time Calculation (min) 55 min   Equipment Utilized During Treatment compression wraps- see TREATMENT secvtion for itemized list   Activity Tolerance Patient tolerated treatment well;No increased pain   Behavior During Therapy [facility] for tasks assessed/performed      Past Medical History  Diagnosis Date  . Peptic ulcer     Past Surgical History  Procedure Laterality Date  . Mini gastric bypass    . Cholecystectomy    . Breast surgery    . Knee surgery    . Shoulder surgery      There were no vitals filed for this visit.  Visit Diagnosis:  Lymphedema      Subjective Assessment - 07/01/15 1219    Subjective  Pt presents for visit 12 tIntensive Phase CDT to BLE w/ initial Rx to RLE. Pt reports she is unable to sleep in wraps to groin on LLE. Encouraged her to try leaving wraps on at least below the knee for HOS.   Patient is accompained by: Family member   Pertinent History osteporosis; s/p gastric bypass 2003 ( s/p ~250# wt loss);  strong + family hx for BLE lymphedema- mother and maternal grandmother; BLE swelling onset early adulthood; frequent falls ( 3-4 in last month with frequent loss of balance   Limitations difficlty walking, standing, lower body dressing , fitting LB clothing and street shoes, transfers, bed moility, bathing, performing hygine, falls, limits participation in home management and productive work/ volunterr activities, limits ability to participate in  sociall family and travel  activivities   Patient Stated Goals improve unctional ambulation, ability to transfer and move around in bed, and increase activity level w/ less sedentary hours ( Current Level of satiscation with performance of all activities rated as 10 on 1-10 scale w/ 1 most satified and 10 least satisfied)most dissatisfied) on 1-10 scall                              OT Education - 07/01/15 1220    Education provided Yes   Education Details Emphasis of skilled LE self-care training today on  simple self-MLD. Pt has mastered J stroke w/ R hand, but has difficulty performing w/ L hand 2/2 contracture.  Pt able to complete short neck sequence by end of session.  Technique will improve with practice.   Person(s) Educated Patient   Methods Explanation;Demonstration;Tactile cues;Verbal cues;Handout   Comprehension Verbalized understanding;Returned demonstration;Verbal cues required;Tactile cues required;Need further instruction             OT Long Term Goals - 06/14/15 1415    OT LONG TERM GOAL #1   Title Pt able to correctly apply gradient compression wraps from toes to groiin with Max assistance from caregiver within 2 weeks for optimal limb volume reduction and LE self management over time.- Pt is independent w/ compression wraps. No CG assistance required.   Baseline dependent   Time  2   Period Weeks   Status Achieved   OT LONG TERM GOAL #2   Title Pt to achieve 20% limb volume reductions bilaterally by DC to limit lymphedema (LE) progressio, to limit infection risk, to decrease falls risk, and to improve functional mobility and ambulation.-Partially met on LLE w/ 13.67% limb volume reduction measured today.   Baseline dependent   Time 12   Period Weeks   Status Partially Met   OT LONG TERM GOAL #3   Title Pt mod assist and at least 85% compliant with all LE self-care protocols, including simple self-manual lymphatic drainage (MLD), skin care, lymphatic pumping ther ex, and  donning/ doffing progression garments.- Pt still learning protocols but shows excellent progress with skin care, wraps, simple self MLD, and ther ex.   Baseline dependent   Time 12   Period Weeks   Status Partially Met   OT LONG TERM GOAL #4   Title Pt to remain infection free throughout CDT course to limit infection and LE progression.   Baseline dependent   Time 12   Status Partially Met   OT LONG TERM GOAL #5   Title During Management Phase CDT Pt to sustain limb volume reductions achieved during Intensive Phase CDT within 5% utilizing LE self-care protocols, appropriate compression garments/ devices, and moderate caregiver assistance.   Baseline dependent   Time 6   Period Months   Status New   OT LONG TERM GOAL #6   Title Pt satisfaction with ability to perform safe functional ambulation to ienable her to complete food shopping activities to mprove from  level 10 ( totally unsatisfied)  to level 7 on a 1-10 scale with 1 being totally satisfied.   Baseline 10 ( totally unsatisfied on 1-10 scale   Time 12   Period Weeks   Status New   OT LONG TERM GOAL #7   Title Pt satisfaction with ability to perform safe functional mobility for car transfers, STS transfers, befd mobility, ytub/shower transfers)  to [facility] from  level 10 ( totally unsatisfied)  to level 7 on a 1-10 scale with 1 being totally satisfied.   Baseline 10 ( totally unsatisfied on 1-10 scale   Time 12   Period Weeks   Status New   OT LONG TERM GOAL #8   Title Pt satisfaction with current activity level needed  to participate in socail and community activities, and to complete ADLs, leisure and productive activities without excessive fatigue to mprove from  level 10 ( totally unsatisfied)  to level 7 on a 1-10 scale with 1 being totally satisfied.   Baseline 10 ( totally unsatisfied on 1-10 scale   Time 12   Period Weeks   Status New   OT LONG TERM GOAL  #9   Baseline Pt able to reduce time being sedentary from 8  waking hours daily to 2 hours daily in order to increase social and community participation to improve overall life satisfaction.   Time 12   Period Weeks   Status New               Problem List Patient Active Problem List   Diagnosis Date Noted  . Vitamin D deficiency 08/20/2014  . Secondary hyperparathyroidism ([place]) 08/20/2014    Andrey Spearman, MS, OTR/L, [facility] 07/01/2015 12:24 PM   [place] MAIN [facility] SERVICES [address] [place], [place], [address] Phone: [phone]   Fax:  [phone]  Name: Laquanda A Burns MRN:  [id number] Date of Birth: 18-Mar-1951

## 2015-07-01 NOTE — Patient Instructions (Signed)
LE instructions and precautions as established- see initial eval.   

## 2015-07-03 ENCOUNTER — Ambulatory Visit: Payer: 59 | Admitting: Occupational Therapy

## 2015-07-05 ENCOUNTER — Ambulatory Visit: Payer: 59 | Admitting: Occupational Therapy

## 2015-07-05 DIAGNOSIS — I890 Lymphedema, not elsewhere classified: Secondary | ICD-10-CM

## 2015-07-05 NOTE — Patient Instructions (Signed)
LE instructions and precautions as established- see initial eval.   

## 2015-07-05 NOTE — Therapy (Signed)
[place] MAIN [facility] SERVICES [address] [place], [place], [address] Phone: [phone]   Fax:  [phone]  Occupational Therapy Treatment  Patient Details  Name: [name] MRN: [id number] Date of Birth: 04/26/1951 No Data Recorded  Encounter Date: 07/05/2015      OT End of Session - 07/05/15 1221    Visit Number 13   Number of Visits 36   Date for OT Re-Evaluation 08/19/15   OT Start Time 1004   OT Stop Time 1104   OT Time Calculation (min) 60 min   Equipment Utilized During Treatment compression wraps- see TREATMENT secvtion for itemized list   Activity Tolerance Patient tolerated treatment well;No increased pain   Behavior During Therapy [facility] for tasks assessed/performed      Past Medical History  Diagnosis Date  . Peptic ulcer     Past Surgical History  Procedure Laterality Date  . Mini gastric bypass    . Cholecystectomy    . Breast surgery    . Knee surgery    . Shoulder surgery      There were no vitals filed for this visit.  Visit Diagnosis:  Lymphedema      Subjective Assessment - 07/05/15 1007    Subjective  Pt presents for visit 13 tIntensive Phase CDT to BLE w/ initial Rx to RLE. Pt has no new complaints today. Self care between visits is going well by report.   Patient is accompained by: Family member   Pertinent History osteporosis; s/p gastric bypass 2003 ( s/p ~250# wt loss);  strong + family hx for BLE lymphedema- mother and maternal grandmother; BLE swelling onset early adulthood; frequent falls ( 3-4 in last month with frequent loss of balance   Limitations difficlty walking, standing, lower body dressing , fitting LB clothing and street shoes, transfers, bed moility, bathing, performing hygine, falls, limits participation in home management and productive work/ volunterr activities, limits ability to participate in  sociall family and travel activivities   Patient Stated Goals improve unctional  ambulation, ability to transfer and move around in bed, and increase activity level w/ less sedentary hours ( Current Level of satiscation with performance of all activities rated as 10 on 1-10 scale w/ 1 most satified and 10 least satisfied)most dissatisfied) on 1-10 scall                      OT Treatments/Exercises (OP) - 07/05/15 0001    ADLs   ADL Education Given Yes   Manual Therapy   Manual Therapy Edema management;Manual Lymphatic Drainage (MLD);Compression Bandaging   Manual therapy comments skin care w/ low pH castor oil [facility]) during MLD   Manual Lymphatic Drainage (MLD) Manual lymph drainage (MLD) in supine utilizing functional inguinal lymph nodes and deep abdominal lymphatics as is customary for non-cancer related lower extremity LE, including bilateral "short neck" sequence, J strokes to sub and supraclavicular LN, deep abdominal pathways, functional inguinal LN, lower extremity proximal to distal w/ emphasis on medial knee bottleneck and politeal LN. Performed fibrosis technique to B maleoli and distal  legs to address fatty fibrosis. Good tolerance.   Compression Bandaging LLE gradient compression wraps applied circumferentially in gradient configuration from toes to groin as follows: toe wrap x1 under cotton stockinett; 8 cm x 1 to foot and ankle, 10 cm x 2, then  12 cm x 2- all layered over .04 x 10 cm and 12 cm Rosidol Soft foam from A-G.  OT Education - 07/05/15 1223    Education provided Yes   Education Details Emphasis of LE self care training today on simple self MLD. Pt able to complete entire sequence w/ mod A by end of session.   Person(s) Educated Patient   Methods Explanation;Demonstration;Tactile cues;Verbal cues   Comprehension Verbalized understanding;Returned demonstration;Verbal cues required;Tactile cues required;Need further instruction             OT Long Term Goals - 06/14/15 1415    OT LONG TERM GOAL #1    Title Pt able to correctly apply gradient compression wraps from toes to groiin with Max assistance from caregiver within 2 weeks for optimal limb volume reduction and LE self management over time.- Pt is independent w/ compression wraps. No CG assistance required.   Baseline dependent   Time 2   Period Weeks   Status Achieved   OT LONG TERM GOAL #2   Title Pt to achieve 20% limb volume reductions bilaterally by DC to limit lymphedema (LE) progressio, to limit infection risk, to decrease falls risk, and to improve functional mobility and ambulation.-Partially met on LLE w/ 13.67% limb volume reduction measured today.   Baseline dependent   Time 12   Period Weeks   Status Partially Met   OT LONG TERM GOAL #3   Title Pt mod assist and at least 85% compliant with all LE self-care protocols, including simple self-manual lymphatic drainage (MLD), skin care, lymphatic pumping ther ex, and donning/ doffing progression garments.- Pt still learning protocols but shows excellent progress with skin care, wraps, simple self MLD, and ther ex.   Baseline dependent   Time 12   Period Weeks   Status Partially Met   OT LONG TERM GOAL #4   Title Pt to remain infection free throughout CDT course to limit infection and LE progression.   Baseline dependent   Time 12   Status Partially Met   OT LONG TERM GOAL #5   Title During Management Phase CDT Pt to sustain limb volume reductions achieved during Intensive Phase CDT within 5% utilizing LE self-care protocols, appropriate compression garments/ devices, and moderate caregiver assistance.   Baseline dependent   Time 6   Period Months   Status New   OT LONG TERM GOAL #6   Title Pt satisfaction with ability to perform safe functional ambulation to ienable her to complete food shopping activities to mprove from  level 10 ( totally unsatisfied)  to level 7 on a 1-10 scale with 1 being totally satisfied.   Baseline 10 ( totally unsatisfied on 1-10 scale    Time 12   Period Weeks   Status New   OT LONG TERM GOAL #7   Title Pt satisfaction with ability to perform safe functional mobility for car transfers, STS transfers, befd mobility, ytub/shower transfers)  to [facility] from  level 10 ( totally unsatisfied)  to level 7 on a 1-10 scale with 1 being totally satisfied.   Baseline 10 ( totally unsatisfied on 1-10 scale   Time 12   Period Weeks   Status New   OT LONG TERM GOAL #8   Title Pt satisfaction with current activity level needed  to participate in socail and community activities, and to complete ADLs, leisure and productive activities without excessive fatigue to mprove from  level 10 ( totally unsatisfied)  to level 7 on a 1-10 scale with 1 being totally satisfied.   Baseline 10 ( totally unsatisfied on 1-10 scale   Time  12   Period Weeks   Status New   OT LONG TERM GOAL  #9   Baseline Pt able to reduce time being sedentary from 8 waking hours daily to 2 hours daily in order to increase social and community participation to improve overall life satisfaction.   Time 12   Period Weeks   Status New               Plan - 07/05/15 1226    Clinical Impression Statement Pt demonstrates progress today towards LE self care for simple self MLD. By end of session she was able to perform entire LE sequence w/ moderate A.    Pt will benefit from skilled therapeutic intervention in order to improve on the following deficits (Retired) Abnormal gait;Decreased range of motion;Difficulty walking;Impaired flexibility;Increased edema;Decreased activity tolerance;Decreased knowledge of precautions;Decreased skin integrity;Decreased knowledge of use of DME;Pain;Decreased mobility;Impaired perceived functional ability   Rehab Potential Good   OT Frequency 3x / week   OT Duration 12 weeks   OT Treatment/Interventions Self-care/ADL training;DME and/or AE instruction;Manual lymph drainage;Patient/family education;Compression bandaging;Therapeutic  exercises;Therapeutic activities;Manual Therapy   Consulted and Agree with Plan of Care Patient;Family member/caregiver        Problem List Patient Active Problem List   Diagnosis Date Noted  . Vitamin D deficiency 08/20/2014  . Secondary hyperparathyroidism ([place]) 08/20/2014   Andrey Spearman, MS, OTR/L, [facility] 07/05/2015 12:28 PM   [place] MAIN [facility] SERVICES [address] [place], [place], [address] Phone: [phone]   Fax:  [phone]  Name: [name] MRN: [id number] Date of Birth: [birth year]

## 2015-07-08 ENCOUNTER — Ambulatory Visit: Payer: 59 | Admitting: Occupational Therapy

## 2015-07-08 DIAGNOSIS — I890 Lymphedema, not elsewhere classified: Secondary | ICD-10-CM | POA: Diagnosis not present

## 2015-07-08 NOTE — Patient Instructions (Signed)
LE instructions and precautions as established- see initial eval.   

## 2015-07-08 NOTE — Therapy (Signed)
[place] MAIN [facility] SERVICES [address] [place], [place], [address] Phone: [phone]   Fax:  [phone]  Occupational Therapy Treatment  Patient Details  Name: [name] MRN: [id number] Date of Birth: 16-Apr-1951 No Data Recorded  Encounter Date: 07/08/2015      OT End of Session - 07/08/15 1527    Visit Number 14   Number of Visits 36   Date for OT Re-Evaluation 08/19/15   OT Start Time 1006   OT Stop Time 1110   OT Time Calculation (min) 64 min   Equipment Utilized During Treatment compression wraps- see TREATMENT secvtion for itemized list   Activity Tolerance Patient tolerated treatment well;No increased pain   Behavior During Therapy [facility] for tasks assessed/performed      Past Medical History  Diagnosis Date  . Peptic ulcer     Past Surgical History  Procedure Laterality Date  . Mini gastric bypass    . Cholecystectomy    . Breast surgery    . Knee surgery    . Shoulder surgery      There were no vitals filed for this visit.  Visit Diagnosis:  Lymphedema      Subjective Assessment - 07/08/15 1524    Subjective  Pt presents for visit 14 tIntensive Phase CDT to BLE w/ initial Rx to RLE. Pt has no new complaints today. Self care between visits is going well by report.   Patient is accompained by: Family member   Pertinent History osteporosis; s/p gastric bypass 2003 ( s/p ~250# wt loss);  strong + family hx for BLE lymphedema- mother and maternal grandmother; BLE swelling onset early adulthood; frequent falls ( 3-4 in last month with frequent loss of balance   Limitations difficlty walking, standing, lower body dressing , fitting LB clothing and street shoes, transfers, bed moility, bathing, performing hygine, falls, limits participation in home management and productive work/ volunterr activities, limits ability to participate in  sociall family and travel activivities   Patient Stated Goals improve unctional  ambulation, ability to transfer and move around in bed, and increase activity level w/ less sedentary hours ( Current Level of satiscation with performance of all activities rated as 10 on 1-10 scale w/ 1 most satified and 10 least satisfied)most dissatisfied) on 1-10 scall                      OT Treatments/Exercises (OP) - 07/08/15 0001    ADLs   ADL Education Given Yes   Manual Therapy   Manual Therapy Edema management;Manual Lymphatic Drainage (MLD);Compression Bandaging   Manual therapy comments skin care w/ low pH castor oil [facility]) during MLD   Manual Lymphatic Drainage (MLD) Manual lymph drainage (MLD) in supine utilizing functional inguinal lymph nodes and deep abdominal lymphatics as is customary for non-cancer related lower extremity LE, including bilateral "short neck" sequence, J strokes to sub and supraclavicular LN, deep abdominal pathways, functional inguinal LN, lower extremity proximal to distal w/ emphasis on medial knee bottleneck and politeal LN. Performed fibrosis technique to B maleoli and distal  legs to address fatty fibrosis. Good tolerance.   Compression Bandaging Added bumpy comprex foam pades to medial and lateral mallioli on L foot today in effort to breakdown fatty fibrosis at ankle. LLE gradient compression wraps applied circumferentially in gradient configuration from toes to groin as follows: toe wrap x1 under cotton stockinett; 8 cm x 1 to foot and ankle, 10 cm x  2, then  12 cm x 2- all layered over .04 x 10 cm and 12 cm Rosidol Soft foam from A-G.                OT Education - 07/08/15 1527    Education provided Yes   Education Details Continued skilled Pt/caregiver Education  And LE ADL training throughout visit for lymphedema self care components, including compression wrapping, compression garment and device wear/care, lymphatic pumping ther ex, simple self-MLD, and skin care.    Person(s) Educated Patient   Methods  Explanation;Demonstration   Comprehension Verbalized understanding             OT Long Term Goals - 06/14/15 1415    OT LONG TERM GOAL #1   Title Pt able to correctly apply gradient compression wraps from toes to groiin with Max assistance from caregiver within 2 weeks for optimal limb volume reduction and LE self management over time.- Pt is independent w/ compression wraps. No CG assistance required.   Baseline dependent   Time 2   Period Weeks   Status Achieved   OT LONG TERM GOAL #2   Title Pt to achieve 20% limb volume reductions bilaterally by DC to limit lymphedema (LE) progressio, to limit infection risk, to decrease falls risk, and to improve functional mobility and ambulation.-Partially met on LLE w/ 13.67% limb volume reduction measured today.   Baseline dependent   Time 12   Period Weeks   Status Partially Met   OT LONG TERM GOAL #3   Title Pt mod assist and at least 85% compliant with all LE self-care protocols, including simple self-manual lymphatic drainage (MLD), skin care, lymphatic pumping ther ex, and donning/ doffing progression garments.- Pt still learning protocols but shows excellent progress with skin care, wraps, simple self MLD, and ther ex.   Baseline dependent   Time 12   Period Weeks   Status Partially Met   OT LONG TERM GOAL #4   Title Pt to remain infection free throughout CDT course to limit infection and LE progression.   Baseline dependent   Time 12   Status Partially Met   OT LONG TERM GOAL #5   Title During Management Phase CDT Pt to sustain limb volume reductions achieved during Intensive Phase CDT within 5% utilizing LE self-care protocols, appropriate compression garments/ devices, and moderate caregiver assistance.   Baseline dependent   Time 6   Period Months   Status New   OT LONG TERM GOAL #6   Title Pt satisfaction with ability to perform safe functional ambulation to ienable her to complete food shopping activities to mprove from   level 10 ( totally unsatisfied)  to level 7 on a 1-10 scale with 1 being totally satisfied.   Baseline 10 ( totally unsatisfied on 1-10 scale   Time 12   Period Weeks   Status New   OT LONG TERM GOAL #7   Title Pt satisfaction with ability to perform safe functional mobility for car transfers, STS transfers, befd mobility, ytub/shower transfers)  to [facility] from  level 10 ( totally unsatisfied)  to level 7 on a 1-10 scale with 1 being totally satisfied.   Baseline 10 ( totally unsatisfied on 1-10 scale   Time 12   Period Weeks   Status New   OT LONG TERM GOAL #8   Title Pt satisfaction with current activity level needed  to participate in socail and community activities, and to complete ADLs, leisure and productive activities  without excessive fatigue to mprove from  level 10 ( totally unsatisfied)  to level 7 on a 1-10 scale with 1 being totally satisfied.   Baseline 10 ( totally unsatisfied on 1-10 scale   Time 12   Period Weeks   Status New   OT LONG TERM GOAL  #9   Baseline Pt able to reduce time being sedentary from 8 waking hours daily to 2 hours daily in order to increase social and community participation to improve overall life satisfaction.   Time 12   Period Weeks   Status New               Plan - 07/08/15 1528    Clinical Impression Statement LLE swelling better managed today compared w/ last week. Pt continues to perform all self care between sessions; however continues to have some difficulty tolerating wraps during HOS by repot. Utilized extra padding at ankle today in hopes of decreasing dense, fatty ankle fibrosis. Next visit we'll construct custom chip bag PRN.   Pt will benefit from skilled therapeutic intervention in order to improve on the following deficits (Retired) Abnormal gait;Decreased range of motion;Difficulty walking;Impaired flexibility;Increased edema;Decreased activity tolerance;Decreased knowledge of precautions;Decreased skin integrity;Decreased  knowledge of use of DME;Pain;Decreased mobility;Impaired perceived functional ability   Rehab Potential Good   OT Frequency 3x / week   OT Duration 12 weeks   OT Treatment/Interventions Self-care/ADL training;DME and/or AE instruction;Manual lymph drainage;Patient/family education;Compression bandaging;Therapeutic exercises;Therapeutic activities;Manual Therapy   Consulted and Agree with Plan of Care Patient;Family member/caregiver        Problem List Patient Active Problem List   Diagnosis Date Noted  . Vitamin D deficiency 08/20/2014  . Secondary hyperparathyroidism ([place]) 08/20/2014    Andrey Spearman, MS, OTR/L, [facility] 07/08/2015 3:32 PM  [place] MAIN [facility] SERVICES [address] [place], [place], [address] Phone: [phone]   Fax:  [phone]  Name: [name] MRN: [id number] Date of Birth: 1950-12-28

## 2015-07-10 ENCOUNTER — Ambulatory Visit: Payer: 59 | Admitting: Occupational Therapy

## 2015-07-10 ENCOUNTER — Encounter: Payer: 59 | Admitting: Occupational Therapy

## 2015-07-12 ENCOUNTER — Ambulatory Visit: Payer: 59 | Admitting: Occupational Therapy

## 2015-07-12 DIAGNOSIS — I890 Lymphedema, not elsewhere classified: Secondary | ICD-10-CM

## 2015-07-12 NOTE — Therapy (Signed)
[place] MAIN [facility] SERVICES [address] [place], [place], [address] Phone: [phone]   Fax:  [phone]  Occupational Therapy Treatment  Patient Details  Name: [name] MRN: [id number] Date of Birth: 1950-11-16 No Data Recorded  Encounter Date: 07/12/2015      OT End of Session - 07/12/15 1622    Visit Number 15   Number of Visits 36   Date for OT Re-Evaluation 08/19/15   OT Start Time 1505   OT Stop Time 1615   OT Time Calculation (min) 70 min   Equipment Utilized During Treatment compression wraps- see TREATMENT secvtion for itemized list   Activity Tolerance Patient tolerated treatment well;No increased pain   Behavior During Therapy [facility] for tasks assessed/performed      Past Medical History  Diagnosis Date  . Peptic ulcer     Past Surgical History  Procedure Laterality Date  . Mini gastric bypass    . Cholecystectomy    . Breast surgery    . Knee surgery    . Shoulder surgery      There were no vitals filed for this visit.  Visit Diagnosis:  Lymphedema      Subjective Assessment - 07/12/15 1535    Subjective  Pt presents for visit 15 tIntensive Phase CDT to BLE w/ initial Rx to RLE. Pt has no new complaints. She is pleased with the softening provided by dot foam at ankle.   Patient is accompained by: Family member   Pertinent History osteporosis; s/p gastric bypass 2003 ( s/p ~250# wt loss);  strong + family hx for BLE lymphedema- mother and maternal grandmother; BLE swelling onset early adulthood; frequent falls ( 3-4 in last month with frequent loss of balance   Limitations difficlty walking, standing, lower body dressing , fitting LB clothing and street shoes, transfers, bed moility, bathing, performing hygine, falls, limits participation in home management and productive work/ volunterr activities, limits ability to participate in  sociall family and travel activivities   Patient Stated Goals improve  unctional ambulation, ability to transfer and move around in bed, and increase activity level w/ less sedentary hours ( Current Level of satiscation with performance of all activities rated as 10 on 1-10 scale w/ 1 most satified and 10 least satisfied)most dissatisfied) on 1-10 scall                      OT Treatments/Exercises (OP) - 07/12/15 0001    ADLs   ADL Education Given Yes   Manual Therapy   Manual Therapy Edema management;Manual Lymphatic Drainage (MLD);Compression Bandaging   Manual therapy comments skin care w/ low pH castor oil [facility]) during MLD   Manual Lymphatic Drainage (MLD) Manual lymph drainage (MLD) in supine utilizing functional inguinal lymph nodes and deep abdominal lymphatics as is customary for non-cancer related lower extremity LE, including bilateral "short neck" sequence, J strokes to sub and supraclavicular LN, deep abdominal pathways, functional inguinal LN, lower extremity proximal to distal w/ emphasis on medial knee bottleneck and politeal LN. Performed fibrosis technique to B maleoli and distal  legs to address fatty fibrosis. Good tolerance.   Compression Bandaging Added bumpy comprex foam pades to medial and lateral mallioli on L foot today in effort to breakdown fatty fibrosis at ankle. LLE gradient compression wraps applied circumferentially in gradient configuration from toes to groin as follows: toe wrap x1 under cotton stockinett; 8 cm x 1 to foot and ankle, 10  cm x 2, then  12 cm x 2- all layered over .04 x 10 cm and 12 cm Rosidol Soft foam from A-G.  Custom fabricated chip bag for lower leg and lateral malleol                OT Education - 07/12/15 1621    Education provided Yes   Education Details Reviewed J strike. Provided rational and function of chip foam pads to decrease tissue fibrosis and provide high and low pressure to facilitate decreased tissue density. Pt assisted with Architect .   Person(s) Educated Patient    Methods Explanation;Demonstration;Tactile cues;Verbal cues;Handout   Comprehension Verbalized understanding;Returned demonstration;Verbal cues required;Tactile cues required;Need further instruction             OT Long Term Goals - 06/14/15 1415    OT LONG TERM GOAL #1   Title Pt able to correctly apply gradient compression wraps from toes to groiin with Max assistance from caregiver within 2 weeks for optimal limb volume reduction and LE self management over time.- Pt is independent w/ compression wraps. No CG assistance required.   Baseline dependent   Time 2   Period Weeks   Status Achieved   OT LONG TERM GOAL #2   Title Pt to achieve 20% limb volume reductions bilaterally by DC to limit lymphedema (LE) progressio, to limit infection risk, to decrease falls risk, and to improve functional mobility and ambulation.-Partially met on LLE w/ 13.67% limb volume reduction measured today.   Baseline dependent   Time 12   Period Weeks   Status Partially Met   OT LONG TERM GOAL #3   Title Pt mod assist and at least 85% compliant with all LE self-care protocols, including simple self-manual lymphatic drainage (MLD), skin care, lymphatic pumping ther ex, and donning/ doffing progression garments.- Pt still learning protocols but shows excellent progress with skin care, wraps, simple self MLD, and ther ex.   Baseline dependent   Time 12   Period Weeks   Status Partially Met   OT LONG TERM GOAL #4   Title Pt to remain infection free throughout CDT course to limit infection and LE progression.   Baseline dependent   Time 12   Status Partially Met   OT LONG TERM GOAL #5   Title During Management Phase CDT Pt to sustain limb volume reductions achieved during Intensive Phase CDT within 5% utilizing LE self-care protocols, appropriate compression garments/ devices, and moderate caregiver assistance.   Baseline dependent   Time 6   Period Months   Status New   OT LONG TERM GOAL #6   Title  Pt satisfaction with ability to perform safe functional ambulation to ienable her to complete food shopping activities to mprove from  level 10 ( totally unsatisfied)  to level 7 on a 1-10 scale with 1 being totally satisfied.   Baseline 10 ( totally unsatisfied on 1-10 scale   Time 12   Period Weeks   Status New   OT LONG TERM GOAL #7   Title Pt satisfaction with ability to perform safe functional mobility for car transfers, STS transfers, befd mobility, ytub/shower transfers)  to [facility] from  level 10 ( totally unsatisfied)  to level 7 on a 1-10 scale with 1 being totally satisfied.   Baseline 10 ( totally unsatisfied on 1-10 scale   Time 12   Period Weeks   Status New   OT LONG TERM GOAL #8   Title Pt satisfaction with current  activity level needed  to participate in socail and community activities, and to complete ADLs, leisure and productive activities without excessive fatigue to mprove from  level 10 ( totally unsatisfied)  to level 7 on a 1-10 scale with 1 being totally satisfied.   Baseline 10 ( totally unsatisfied on 1-10 scale   Time 12   Period Weeks   Status New   OT LONG TERM GOAL  #9   Baseline Pt able to reduce time being sedentary from 8 waking hours daily to 2 hours daily in order to increase social and community participation to improve overall life satisfaction.   Time 12   Period Weeks   Status New               Plan - 07/12/15 1622    Clinical Impression Statement Tisse fibrosis at lateral anmd medial L malleoli  is softer, less dense and more pliable today after removing dotted foam pads. Pt applied these too tightly however, and she c/o soreness. To alleviate soreness and to distribute chipped foam over larger area we constructed and applied custom  chip bag made of multiple foam densities., Pt reported improved comfort w/ this muff. Pt agreed to work on compression compliance during visit interval in an effort to try to decongest leg to stage for measuring  for wraps by end of next week. If she's unable to tolerate ful leg wrap for HOS, she'll try sleeping in knee length wrap as alternative.   Pt will benefit from skilled therapeutic intervention in order to improve on the following deficits (Retired) Abnormal gait;Decreased range of motion;Difficulty walking;Impaired flexibility;Increased edema;Decreased activity tolerance;Decreased knowledge of precautions;Decreased skin integrity;Decreased knowledge of use of DME;Pain;Decreased mobility;Impaired perceived functional ability   Rehab Potential Good   OT Frequency 3x / week   OT Duration 12 weeks   OT Treatment/Interventions Self-care/ADL training;DME and/or AE instruction;Manual lymph drainage;Patient/family education;Compression bandaging;Therapeutic exercises;Therapeutic activities;Manual Therapy   Consulted and Agree with Plan of Care Patient;Family member/caregiver        Problem List Patient Active Problem List   Diagnosis Date Noted  . Vitamin D deficiency 08/20/2014  . Secondary hyperparathyroidism ([place]) 08/20/2014   Andrey Spearman, MS, OTR/L, [facility] 07/12/2015 4:27 PM    [place] MAIN [facility] SERVICES [address] [place], [place], [address] Phone: [phone]   Fax:  [phone]  Name: [name] MRN: [id number] Date of Birth: 1950/10/31

## 2015-07-12 NOTE — Patient Instructions (Signed)
LE instructions and precautions as established- see initial eval.   

## 2015-07-15 ENCOUNTER — Ambulatory Visit: Payer: 59 | Admitting: Occupational Therapy

## 2015-07-15 DIAGNOSIS — I890 Lymphedema, not elsewhere classified: Secondary | ICD-10-CM | POA: Diagnosis not present

## 2015-07-15 NOTE — Therapy (Signed)
[place] MAIN [facility] SERVICES [address] [place], [place], [address] Phone: [phone]   Fax:  [phone]  Occupational Therapy Treatment  Patient Details  Name: [name] MRN: [id number] Date of Birth: [birth year] No Data Recorded  Encounter Date: 07/15/2015      OT End of Session - 07/15/15 1223    Visit Number 16   Number of Visits 36   Date for OT Re-Evaluation 08/19/15   OT Start Time 1015   OT Stop Time 1105   OT Time Calculation (min) 50 min   Equipment Utilized During Treatment compression wraps- see TREATMENT secvtion for itemized list   Activity Tolerance Patient tolerated treatment well;No increased pain   Behavior During Therapy [facility] for tasks assessed/performed      Past Medical History  Diagnosis Date  . Peptic ulcer     Past Surgical History  Procedure Laterality Date  . Mini gastric bypass    . Cholecystectomy    . Breast surgery    . Knee surgery    . Shoulder surgery      There were no vitals filed for this visit.  Visit Diagnosis:  Lymphedema      Subjective Assessment - 07/15/15 1221    Subjective  Pt presents for visit 16 tIntensive Phase CDT to BLE w/ initial Rx to RLE. Pt has no new complaints. Pt reports improved co,mpliance w/ compression wraps over the weekend.   Patient is accompained by: Family member   Pertinent History osteporosis; s/p gastric bypass 2003 ( s/p ~250# wt loss);  strong + family hx for BLE lymphedema- mother and maternal grandmother; BLE swelling onset early adulthood; frequent falls ( 3-4 in last month with frequent loss of balance   Limitations difficlty walking, standing, lower body dressing , fitting LB clothing and street shoes, transfers, bed moility, bathing, performing hygine, falls, limits participation in home management and productive work/ volunterr activities, limits ability to participate in  sociall family and travel activivities   Patient Stated Goals  improve unctional ambulation, ability to transfer and move around in bed, and increase activity level w/ less sedentary hours ( Current Level of satiscation with performance of all activities rated as 10 on 1-10 scale w/ 1 most satified and 10 least satisfied)most dissatisfied) on 1-10 scall                              OT Education - 07/15/15 1221    Education provided Yes   Education Details Cont ADL training for simple self MLD. 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 Provided name of hand specialist and OT CHT at [facility].   Person(s) Educated Patient   Methods Explanation;Handout   Comprehension Verbalized understanding;Tactile cues required;Need further instruction             OT Long Term Goals - 06/14/15 1415    OT LONG TERM GOAL #1   Title Pt able to correctly apply gradient compression wraps from toes to groiin with Max assistance from caregiver within 2 weeks for optimal limb volume reduction and LE self management over time.- Pt is independent w/ compression wraps. No CG assistance required.   Baseline dependent   Time 2   Period Weeks   Status Achieved   OT LONG TERM GOAL #2   Title Pt to achieve 20% limb volume reductions bilaterally by DC to limit lymphedema (  LE) progressio, to limit infection risk, to decrease falls risk, and to improve functional mobility and ambulation.-Partially met on LLE w/ 13.67% limb volume reduction measured today.   Baseline dependent   Time 12   Period Weeks   Status Partially Met   OT LONG TERM GOAL #3   Title Pt mod assist and at least 85% compliant with all LE self-care protocols, including simple self-manual lymphatic drainage (MLD), skin care, lymphatic pumping ther ex, and donning/ doffing progression garments.- Pt still learning protocols but shows excellent progress with skin care, wraps, simple self MLD, and ther ex.   Baseline dependent   Time 12   Period Weeks    Status Partially Met   OT LONG TERM GOAL #4   Title Pt to remain infection free throughout CDT course to limit infection and LE progression.   Baseline dependent   Time 12   Status Partially Met   OT LONG TERM GOAL #5   Title During Management Phase CDT Pt to sustain limb volume reductions achieved during Intensive Phase CDT within 5% utilizing LE self-care protocols, appropriate compression garments/ devices, and moderate caregiver assistance.   Baseline dependent   Time 6   Period Months   Status New   OT LONG TERM GOAL #6   Title Pt satisfaction with ability to perform safe functional ambulation to ienable her to complete food shopping activities to mprove from  level 10 ( totally unsatisfied)  to level 7 on a 1-10 scale with 1 being totally satisfied.   Baseline 10 ( totally unsatisfied on 1-10 scale   Time 12   Period Weeks   Status New   OT LONG TERM GOAL #7   Title Pt satisfaction with ability to perform safe functional mobility for car transfers, STS transfers, befd mobility, ytub/shower transfers)  to [facility] from  level 10 ( totally unsatisfied)  to level 7 on a 1-10 scale with 1 being totally satisfied.   Baseline 10 ( totally unsatisfied on 1-10 scale   Time 12   Period Weeks   Status New   OT LONG TERM GOAL #8   Title Pt satisfaction with current activity level needed  to participate in socail and community activities, and to complete ADLs, leisure and productive activities without excessive fatigue to mprove from  level 10 ( totally unsatisfied)  to level 7 on a 1-10 scale with 1 being totally satisfied.   Baseline 10 ( totally unsatisfied on 1-10 scale   Time 12   Period Weeks   Status New   OT LONG TERM GOAL  #9   Baseline Pt able to reduce time being sedentary from 8 waking hours daily to 2 hours daily in order to increase social and community participation to improve overall life satisfaction.   Time 12   Period Weeks   Status New               Plan -  07/15/15 1224    Clinical Impression Statement Limb volume continues to decrease and tissue fibrosis to soften w/ current wrap regime. Pt demonstrated improved self catre compliance over the weekend. Hope to complete garment measurements on Friday 11/18.   Pt will benefit from skilled therapeutic intervention in order to improve on the following deficits (Retired) Abnormal gait;Decreased range of motion;Difficulty walking;Impaired flexibility;Increased edema;Decreased activity tolerance;Decreased knowledge of precautions;Decreased skin integrity;Decreased knowledge of use of DME;Pain;Decreased mobility;Impaired perceived functional ability   Rehab Potential Good   OT Frequency 3x / week  OT Duration 12 weeks   OT Treatment/Interventions Self-care/ADL training;DME and/or AE instruction;Manual lymph drainage;Patient/family education;Compression bandaging;Therapeutic exercises;Therapeutic activities;Manual Therapy   Consulted and Agree with Plan of Care Patient;Family member/caregiver        Problem List Patient Active Problem List   Diagnosis Date Noted  . Vitamin D deficiency 08/20/2014  . Secondary hyperparathyroidism ([place]) 08/20/2014    Andrey Spearman, MS, OTR/L, [facility] 07/15/2015 12:26 PM  [place] MAIN [facility] SERVICES [address] [place], [place], [address] Phone: [phone]   Fax:  [phone]  Name: [name] MRN: [id number] Date of Birth: Nov 12, 1950

## 2015-07-17 ENCOUNTER — Ambulatory Visit: Payer: 59 | Attending: Family Medicine | Admitting: Occupational Therapy

## 2015-07-17 ENCOUNTER — Ambulatory Visit: Payer: 59 | Admitting: Occupational Therapy

## 2015-07-17 DIAGNOSIS — I890 Lymphedema, not elsewhere classified: Secondary | ICD-10-CM | POA: Insufficient documentation

## 2015-07-18 NOTE — Patient Instructions (Signed)
LE instructions and precautions as established- see initial eval.   

## 2015-07-18 NOTE — Therapy (Signed)
[place] MAIN [facility] SERVICES [address] [place], [place], [address] Phone: [phone]   Fax:  [phone]  Occupational Therapy Treatment  Patient Details  Name: [name] MRN: [id number] Date of Birth: November 14, 1950 No Data Recorded  Encounter Date: 07/17/2015      OT End of Session - 07/18/15 1440    Visit Number 17   Number of Visits 36   Date for OT Re-Evaluation 08/19/15   OT Start Time 1515   OT Stop Time 1626   OT Time Calculation (min) 71 min   Equipment Utilized During Treatment compression wraps- see TREATMENT secvtion for itemized list   Activity Tolerance Patient tolerated treatment well;No increased pain   Behavior During Therapy [facility] for tasks assessed/performed      Past Medical History  Diagnosis Date  . Peptic ulcer     Past Surgical History  Procedure Laterality Date  . Mini gastric bypass    . Cholecystectomy    . Breast surgery    . Knee surgery    . Shoulder surgery      There were no vitals filed for this visit.  Visit Diagnosis:  Lymphedema      Subjective Assessment - 07/18/15 1438    Subjective  Pt presents for visit 17 tIntensive Phase CDT to BLE w/ initial Rx to RLE. Pt has no new complaints. Pt continues to work on conpression wrap tolerance between sessions.   Patient is accompained by: Family member   Pertinent History osteporosis; s/p gastric bypass 2003 ( s/p ~250# wt loss);  strong + family hx for BLE lymphedema- mother and maternal grandmother; BLE swelling onset early adulthood; frequent falls ( 3-4 in last month with frequent loss of balance   Limitations difficlty walking, standing, lower body dressing , fitting LB clothing and street shoes, transfers, bed moility, bathing, performing hygine, falls, limits participation in home management and productive work/ volunterr activities, limits ability to participate in  sociall family and travel activivities   Patient Stated Goals improve  unctional ambulation, ability to transfer and move around in bed, and increase activity level w/ less sedentary hours ( Current Level of satiscation with performance of all activities rated as 10 on 1-10 scale w/ 1 most satified and 10 least satisfied)most dissatisfied) on 1-10 scall   Currently in Pain? No/denies                              OT Education - 07/18/15 1439    Education provided Yes   Education Details Continued skilled Pt/caregiver Education  And LE ADL training throughout visit for lymphedema self care components, including compression wrapping, compression garment and device wear/care, lymphatic pumping ther ex, simple self-MLD, and skin care.    Person(s) Educated Patient   Methods Explanation   Comprehension Verbalized understanding;Need further instruction             OT Long Term Goals - 06/14/15 1415    OT LONG TERM GOAL #1   Title Pt able to correctly apply gradient compression wraps from toes to groiin with Max assistance from caregiver within 2 weeks for optimal limb volume reduction and LE self management over time.- Pt is independent w/ compression wraps. No CG assistance required.   Baseline dependent   Time 2   Period Weeks   Status Achieved   OT LONG TERM GOAL #2   Title Pt to achieve 20% limb volume  reductions bilaterally by DC to limit lymphedema (LE) progressio, to limit infection risk, to decrease falls risk, and to improve functional mobility and ambulation.-Partially met on LLE w/ 13.67% limb volume reduction measured today.   Baseline dependent   Time 12   Period Weeks   Status Partially Met   OT LONG TERM GOAL #3   Title Pt mod assist and at least 85% compliant with all LE self-care protocols, including simple self-manual lymphatic drainage (MLD), skin care, lymphatic pumping ther ex, and donning/ doffing progression garments.- Pt still learning protocols but shows excellent progress with skin care, wraps, simple self MLD,  and ther ex.   Baseline dependent   Time 12   Period Weeks   Status Partially Met   OT LONG TERM GOAL #4   Title Pt to remain infection free throughout CDT course to limit infection and LE progression.   Baseline dependent   Time 12   Status Partially Met   OT LONG TERM GOAL #5   Title During Management Phase CDT Pt to sustain limb volume reductions achieved during Intensive Phase CDT within 5% utilizing LE self-care protocols, appropriate compression garments/ devices, and moderate caregiver assistance.   Baseline dependent   Time 6   Period Months   Status New   OT LONG TERM GOAL #6   Title Pt satisfaction with ability to perform safe functional ambulation to ienable her to complete food shopping activities to mprove from  level 10 ( totally unsatisfied)  to level 7 on a 1-10 scale with 1 being totally satisfied.   Baseline 10 ( totally unsatisfied on 1-10 scale   Time 12   Period Weeks   Status New   OT LONG TERM GOAL #7   Title Pt satisfaction with ability to perform safe functional mobility for car transfers, STS transfers, befd mobility, ytub/shower transfers)  to [facility] from  level 10 ( totally unsatisfied)  to level 7 on a 1-10 scale with 1 being totally satisfied.   Baseline 10 ( totally unsatisfied on 1-10 scale   Time 12   Period Weeks   Status New   OT LONG TERM GOAL #8   Title Pt satisfaction with current activity level needed  to participate in socail and community activities, and to complete ADLs, leisure and productive activities without excessive fatigue to mprove from  level 10 ( totally unsatisfied)  to level 7 on a 1-10 scale with 1 being totally satisfied.   Baseline 10 ( totally unsatisfied on 1-10 scale   Time 12   Period Weeks   Status New   OT LONG TERM GOAL  #9   Baseline Pt able to reduce time being sedentary from 8 waking hours daily to 2 hours daily in order to increase social and community participation to improve overall life satisfaction.   Time 12    Period Weeks   Status New               Plan - 07/18/15 1440    Clinical Impression Statement Swelling much decreased since last visit by visiual assessment today. 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 Pt more diligently utilizing compression between  visits. Vendor notified and requested measuring ASAP.   Pt will benefit from skilled therapeutic intervention in order to improve on the following deficits (Retired) Abnormal gait;Decreased range of motion;Difficulty walking;Impaired flexibility;Increased edema;Decreased activity tolerance;Decreased knowledge of precautions;Decreased skin integrity;Decreased knowledge of use of DME;Pain;Decreased mobility;Impaired perceived functional ability   Rehab Potential Good  OT Frequency 3x / week   OT Duration 12 weeks   OT Treatment/Interventions Self-care/ADL training;DME and/or AE instruction;Manual lymph drainage;Patient/family education;Compression bandaging;Therapeutic exercises;Therapeutic activities;Manual Therapy   Consulted and Agree with Plan of Care Patient;Family member/caregiver        Problem List Patient Active Problem List   Diagnosis Date Noted  . Vitamin D deficiency 08/20/2014  . Secondary hyperparathyroidism ([place]) 08/20/2014    Andrey Spearman, MS, OTR/L, [facility] 07/18/2015 2:42 PM   [place] MAIN [facility] SERVICES [address] [place], [place], [address] Phone: [phone]   Fax:  [phone]  Name: [name] MRN: [id number] Date of Birth: August 24, 1951

## 2015-07-19 ENCOUNTER — Ambulatory Visit: Payer: 59 | Admitting: Occupational Therapy

## 2015-07-19 DIAGNOSIS — I890 Lymphedema, not elsewhere classified: Secondary | ICD-10-CM

## 2015-07-19 NOTE — Patient Instructions (Signed)
LE instructions and precautions as established- see initial eval.   

## 2015-07-19 NOTE — Therapy (Signed)
[place] MAIN [facility] SERVICES [address] [place], [place], [address] Phone: [phone]   Fax:  [phone]  Occupational Therapy Treatment  Patient Details  Name: [name] MRN: [id number] Date of Birth: 11/24/1950 No Data Recorded  Encounter Date: 07/19/2015      OT End of Session - 07/19/15 1227    Visit Number 18   Number of Visits 36   Date for OT Re-Evaluation 08/19/15   OT Start Time 1000   OT Stop Time 1110   OT Time Calculation (min) 70 min   Equipment Utilized During Treatment compression wraps- see TREATMENT secvtion for itemized list   Activity Tolerance Patient tolerated treatment well;No increased pain   Behavior During Therapy [facility] for tasks assessed/performed      Past Medical History  Diagnosis Date  . Peptic ulcer     Past Surgical History  Procedure Laterality Date  . Mini gastric bypass    . Cholecystectomy    . Breast surgery    . Knee surgery    . Shoulder surgery      There were no vitals filed for this visit.  Visit Diagnosis:  Lymphedema      Subjective Assessment - 07/19/15 1221    Subjective  Pt presents for visit 18 tIntensive Phase CDT to BLE w/ initial Rx to RLE. Pt is pleased with limb volume reductions thus far, Pt is accompanied by her spouse, [name], today. Pt continues to work on conpression wrap tolerance between sessions.Pt reports pneumatic pump is scheduled to arrive next week. She tells me she'll see me in 1 1/2 weeks as she'll be away on vacation.   Patient is accompained by: Family member   Pertinent History osteporosis; s/p gastric bypass 2003 ( s/p ~250# wt loss);  strong + family hx for BLE lymphedema- mother and maternal grandmother; BLE swelling onset early adulthood; frequent falls ( 3-4 in last month with frequent loss of balance   Limitations difficlty walking, standing, lower body dressing , fitting LB clothing and street shoes, transfers, bed moility, bathing, performing  hygine, falls, limits participation in home management and productive work/ volunterr activities, limits ability to participate in  sociall family and travel activivities   Patient Stated Goals improve unctional ambulation, ability to transfer and move around in bed, and increase activity level w/ less sedentary hours ( Current Level of satiscation with performance of all activities rated as 10 on 1-10 scale w/ 1 most satified and 10 least satisfied)most dissatisfied) on 1-10 scall   Currently in Pain? No/denies             LYMPHEDEMA/ONCOLOGY QUESTIONNAIRE - 07/19/15 1223    Left Lower Extremity Lymphedema   Other LLE limb vol=4211.46ml from toes to knee. LLE vol  from toes to groin=9714.75 m   Other Since commencing CDT LLE AD limb volume is decreased overall by 22.12%, and AG volume is decreased by 13.31% overall- GOAL MET                 OT Treatments/Exercises (OP) - 07/19/15 0001    Manual Therapy   Manual Therapy Edema management;Manual Lymphatic Drainage (MLD);Compression Bandaging   Manual therapy comments skin care w/ low pH castor oil [facility]) during MLD   Edema Management Comparative volumetrics today to check progress towards  goals   Manual Lymphatic Drainage (MLD) Manual lymph drainage (MLD) in supine utilizing functional inguinal lymph nodes and deep abdominal lymphatics as is customary for non-cancer related  lower extremity LE, including bilateral "short neck" sequence, J strokes to sub and supraclavicular LN, deep abdominal pathways, functional inguinal LN, lower extremity proximal to distal w/ emphasis on medial knee bottleneck and politeal LN. Performed fibrosis technique to B maleoli and distal  legs to address fatty fibrosis. Good tolerance.   Compression Bandaging Added bumpy comprex foam pades to medial and lateral mallioli on L foot today in effort to breakdown fatty fibrosis at ankle. LLE gradient compression wraps applied circumferentially in  gradient configuration from toes to groin as follows: toe wrap x1 under cotton stockinett; 8 cm x 1 to foot and ankle, 10 cm x 2, then  12 cm x 2- all layered over .04 x 10 cm and 12 cm Rosidol Soft foam from A-G.                OT Education - 07/19/15 1226    Education provided Yes   Person(s) Educated Patient;Spouse   Methods Explanation   Comprehension Verbalized understanding;Need further instruction             OT Long Term Goals - 06/14/15 1415    OT LONG TERM GOAL #1   Title Pt able to correctly apply gradient compression wraps from toes to groiin with Max assistance from caregiver within 2 weeks for optimal limb volume reduction and LE self management over time.- Pt is independent w/ compression wraps. No CG assistance required.   Baseline dependent   Time 2   Period Weeks   Status Achieved   OT LONG TERM GOAL #2   Title Pt to achieve 20% limb volume reductions bilaterally by DC to limit lymphedema (LE) progressio, to limit infection risk, to decrease falls risk, and to improve functional mobility and ambulation.-Partially met on LLE w/ 13.67% limb volume reduction measured today.   Baseline dependent   Time 12   Period Weeks   Status Partially Met   OT LONG TERM GOAL #3   Title Pt mod assist and at least 85% compliant with all LE self-care protocols, including simple self-manual lymphatic drainage (MLD), skin care, lymphatic pumping ther ex, and donning/ doffing progression garments.- Pt still learning protocols but shows excellent progress with skin care, wraps, simple self MLD, and ther ex.   Baseline dependent   Time 12   Period Weeks   Status Partially Met   OT LONG TERM GOAL #4   Title Pt to remain infection free throughout CDT course to limit infection and LE progression.   Baseline dependent   Time 12   Status Partially Met   OT LONG TERM GOAL #5   Title During Management Phase CDT Pt to sustain limb volume reductions achieved during Intensive Phase  CDT within 5% utilizing LE self-care protocols, appropriate compression garments/ devices, and moderate caregiver assistance.   Baseline dependent   Time 6   Period Months   Status New   OT LONG TERM GOAL #6   Title Pt satisfaction with ability to perform safe functional ambulation to ienable her to complete food shopping activities to mprove from  level 10 ( totally unsatisfied)  to level 7 on a 1-10 scale with 1 being totally satisfied.   Baseline 10 ( totally unsatisfied on 1-10 scale   Time 12   Period Weeks   Status New   OT LONG TERM GOAL #7   Title Pt satisfaction with ability to perform safe functional mobility for car transfers, STS transfers, befd mobility, ytub/shower transfers)  to [facility] from  level 10 ( totally unsatisfied)  to level 7 on a 1-10 scale with 1 being totally satisfied.   Baseline 10 ( totally unsatisfied on 1-10 scale   Time 12   Period Weeks   Status New   OT LONG TERM GOAL #8   Title Pt satisfaction with current activity level needed  to participate in socail and community activities, and to complete ADLs, leisure and productive activities without excessive fatigue to mprove from  level 10 ( totally unsatisfied)  to level 7 on a 1-10 scale with 1 being totally satisfied.   Baseline 10 ( totally unsatisfied on 1-10 scale   Time 12   Period Weeks   Status New   OT LONG TERM GOAL  #9   Baseline Pt able to reduce time being sedentary from 8 waking hours daily to 2 hours daily in order to increase social and community participation to improve overall life satisfaction.   Time 12   Period Weeks   Status New               Plan - 07/19/15 1228    Clinical Impression Statement Pt demonstrated excdllent progress towards limb volume reduction goal as evidenced by comparatrive LLE limb volumetrics today. Since commencing CDT LLE AD limb volume is decreased overall by 22.12%, and AG volume is decreased by 13.31% overall- GOAL MET   Pt will benefit from skilled  therapeutic intervention in order to improve on the following deficits (Retired) Abnormal gait;Decreased range of motion;Difficulty walking;Impaired flexibility;Increased edema;Decreased activity tolerance;Decreased knowledge of precautions;Decreased skin integrity;Decreased knowledge of use of DME;Pain;Decreased mobility;Impaired perceived functional ability   Rehab Potential Good   OT Frequency 3x / week   OT Duration 12 weeks   OT Treatment/Interventions Self-care/ADL training;DME and/or AE instruction;Manual lymph drainage;Patient/family education;Compression bandaging;Therapeutic exercises;Therapeutic activities;Manual Therapy   Consulted and Agree with Plan of Care Patient;Family member/caregiver        Problem List Patient Active Problem List   Diagnosis Date Noted  . Vitamin D deficiency 08/20/2014  . Secondary hyperparathyroidism ([place]) 08/20/2014    Andrey Spearman, MS, OTR/L, [facility] 07/19/2015 12:30 PM   [place] MAIN [facility] SERVICES [address] [place], [place], [address] Phone: [phone]   Fax:  [phone]  Name: [name] MRN: [id number] Date of Birth: 1951-06-14

## 2015-07-22 ENCOUNTER — Ambulatory Visit: Payer: 59 | Admitting: Occupational Therapy

## 2015-07-25 ENCOUNTER — Encounter: Payer: 59 | Admitting: Occupational Therapy

## 2015-07-26 ENCOUNTER — Encounter: Payer: 59 | Admitting: Occupational Therapy

## 2015-07-31 ENCOUNTER — Ambulatory Visit: Payer: 59 | Admitting: Occupational Therapy

## 2015-07-31 ENCOUNTER — Encounter: Payer: 59 | Admitting: Occupational Therapy

## 2015-07-31 DIAGNOSIS — I890 Lymphedema, not elsewhere classified: Secondary | ICD-10-CM

## 2015-08-01 ENCOUNTER — Ambulatory Visit: Payer: 59 | Admitting: Occupational Therapy

## 2015-08-01 DIAGNOSIS — I890 Lymphedema, not elsewhere classified: Secondary | ICD-10-CM

## 2015-08-01 NOTE — Patient Instructions (Signed)
LE instructions and precautions as established- see initial eval.   

## 2015-08-01 NOTE — Therapy (Signed)
[place] MAIN [facility] SERVICES [address] [place], [place], [address] Phone: [phone]   Fax:  [phone]  Occupational Therapy Treatment  Patient Details  Name: [name] MRN: [id number] Date of Birth: 05/25/1951 No Data Recorded  Encounter Date: 07/31/2015      OT End of Session - 08/01/15 1035    Visit Number 19   Number of Visits 36   Date for OT Re-Evaluation 08/19/15   OT Start Time 9470   OT Stop Time 1415   OT Time Calculation (min) 70 min   Equipment Utilized During Treatment compression wraps- see TREATMENT secvtion for itemized list   Activity Tolerance Patient tolerated treatment well;No increased pain   Behavior During Therapy [facility] for tasks assessed/performed      Past Medical History  Diagnosis Date  . Peptic ulcer     Past Surgical History  Procedure Laterality Date  . Mini gastric bypass    . Cholecystectomy    . Breast surgery    . Knee surgery    . Shoulder surgery      There were no vitals filed for this visit.  Visit Diagnosis:  Lymphedema      Subjective Assessment - 08/01/15 1030    Subjective  Pt presents for visit 19 tIntensive Phase CDT to BLE w/ initial Rx to RLE. Pt had fall during visit interval 2/2 pre-existing vertigo issue, and arrives today w/ LUE  casted from MPs to elbow for multiple wrist fractures. Pt reports need for sx is pending her F/u apt w/ her orthopedic doc.    Patient is accompained by: Family member   Pertinent History osteporosis; s/p gastric bypass 2003 ( s/p ~250# wt loss);  strong + family hx for BLE lymphedema- mother and maternal grandmother; BLE swelling onset early adulthood; frequent falls ( 3-4 in last month with frequent loss of balance   Limitations difficlty walking, standing, lower body dressing , fitting LB clothing and street shoes, transfers, bed moility, bathing, performing hygine, falls, limits participation in home management and productive work/  volunterr activities, limits ability to participate in  sociall family and travel activivities   Patient Stated Goals improve unctional ambulation, ability to transfer and move around in bed, and increase activity level w/ less sedentary hours ( Current Level of satiscation with performance of all activities rated as 10 on 1-10 scale w/ 1 most satified and 10 least satisfied)most dissatisfied) on 1-10 scall   Currently in Pain? No/denies                              OT Education - 08/01/15 1033    Education provided Yes   Education Details Emphasis of Pt edu on perfecting simple self MLD techniques. Caregiver now involved in assisting w/ this aspect of LE self care 2/2 L wist fracture.   Person(s) Educated Patient;Spouse   Methods Explanation;Demonstration;Tactile cues;Verbal cues;Handout   Comprehension Verbalized understanding;Returned demonstration;Verbal cues required;Tactile cues required;Need further instruction             OT Long Term Goals - 06/14/15 1415    OT LONG TERM GOAL #1   Title Pt able to correctly apply gradient compression wraps from toes to groiin with Max assistance from caregiver within 2 weeks for optimal limb volume reduction and LE self management over time.- Pt is independent w/ compression wraps. No CG assistance required.   Baseline dependent   Time 2  Period Weeks   Status Achieved   OT LONG TERM GOAL #2   Title Pt to achieve 20% limb volume reductions bilaterally by DC to limit lymphedema (LE) progressio, to limit infection risk, to decrease falls risk, and to improve functional mobility and ambulation.-Partially met on LLE w/ 13.67% limb volume reduction measured today.   Baseline dependent   Time 12   Period Weeks   Status Partially Met   OT LONG TERM GOAL #3   Title Pt mod assist and at least 85% compliant with all LE self-care protocols, including simple self-manual lymphatic drainage (MLD), skin care, lymphatic pumping ther  ex, and donning/ doffing progression garments.- Pt still learning protocols but shows excellent progress with skin care, wraps, simple self MLD, and ther ex.   Baseline dependent   Time 12   Period Weeks   Status Partially Met   OT LONG TERM GOAL #4   Title Pt to remain infection free throughout CDT course to limit infection and LE progression.   Baseline dependent   Time 12   Status Partially Met   OT LONG TERM GOAL #5   Title During Management Phase CDT Pt to sustain limb volume reductions achieved during Intensive Phase CDT within 5% utilizing LE self-care protocols, appropriate compression garments/ devices, and moderate caregiver assistance.   Baseline dependent   Time 6   Period Months   Status New   OT LONG TERM GOAL #6   Title Pt satisfaction with ability to perform safe functional ambulation to ienable her to complete food shopping activities to mprove from  level 10 ( totally unsatisfied)  to level 7 on a 1-10 scale with 1 being totally satisfied.   Baseline 10 ( totally unsatisfied on 1-10 scale   Time 12   Period Weeks   Status New   OT LONG TERM GOAL #7   Title Pt satisfaction with ability to perform safe functional mobility for car transfers, STS transfers, befd mobility, ytub/shower transfers)  to [facility] from  level 10 ( totally unsatisfied)  to level 7 on a 1-10 scale with 1 being totally satisfied.   Baseline 10 ( totally unsatisfied on 1-10 scale   Time 12   Period Weeks   Status New   OT LONG TERM GOAL #8   Title Pt satisfaction with current activity level needed  to participate in socail and community activities, and to complete ADLs, leisure and productive activities without excessive fatigue to mprove from  level 10 ( totally unsatisfied)  to level 7 on a 1-10 scale with 1 being totally satisfied.   Baseline 10 ( totally unsatisfied on 1-10 scale   Time 12   Period Weeks   Status New   OT LONG TERM GOAL  #9   Baseline Pt able to reduce time being sedentary  from 8 waking hours daily to 2 hours daily in order to increase social and community participation to improve overall life satisfaction.   Time 12   Period Weeks   Status New               Plan - 08/01/15 1036    Clinical Impression Statement Pt unable to perform simple self MLD w/ casted LUE for wrist fracture. Caregiver agrees to assist w/ daily LE self care PRN during visit intervals  to ensure optimal clinical outcomes. Pt awaiting pump technician  for home visit to assist w/ programming and use instructions. Pt requested vendor change . Referral made to [facility] with  recommendations outlined.  Pt continues to progress towards goals.   Pt will benefit from skilled therapeutic intervention in order to improve on the following deficits (Retired) Abnormal gait;Decreased range of motion;Difficulty walking;Impaired flexibility;Increased edema;Decreased activity tolerance;Decreased knowledge of precautions;Decreased skin integrity;Decreased knowledge of use of DME;Pain;Decreased mobility;Impaired perceived functional ability   Rehab Potential Good   OT Frequency 3x / week   OT Duration 12 weeks   OT Treatment/Interventions Self-care/ADL training;DME and/or AE instruction;Manual lymph drainage;Patient/family education;Compression bandaging;Therapeutic exercises;Therapeutic activities;Manual Therapy   Consulted and Agree with Plan of Care Patient;Family member/caregiver        Problem List Patient Active Problem List   Diagnosis Date Noted  . Vitamin D deficiency 08/20/2014  . Secondary hyperparathyroidism ([place]) 08/20/2014    Andrey Spearman, MS, OTR/L, [facility] 08/01/2015 10:40 AM  [place] MAIN [facility] SERVICES [address] [place], [place], [address] Phone: [phone]   Fax:  [phone]  Name: Joory A Burns MRN: [id number] Date of Birth: [birth year]

## 2015-08-01 NOTE — Therapy (Signed)
[place] MAIN [facility] SERVICES [address] [place], [place], [address] Phone: [phone]   Fax:  [phone]  Occupational Therapy Treatment  Patient Details  Name: [name] MRN: [id number] Date of Birth: 05-Jul-1951 No Data Recorded  Encounter Date: 08/01/2015      OT End of Session - 08/01/15 1056    Visit Number 20   Number of Visits 36   Date for OT Re-Evaluation 08/19/15   OT Start Time 0907   OT Stop Time 1020   OT Time Calculation (min) 73 min   Equipment Utilized During Treatment compression wraps- see TREATMENT secvtion for itemized list   Activity Tolerance Patient tolerated treatment well;No increased pain   Behavior During Therapy [facility] for tasks assessed/performed      Past Medical History  Diagnosis Date  . Peptic ulcer     Past Surgical History  Procedure Laterality Date  . Mini gastric bypass    . Cholecystectomy    . Breast surgery    . Knee surgery    . Shoulder surgery      There were no vitals filed for this visit.  Visit Diagnosis:  Lymphedema      Subjective Assessment - 08/01/15 1030    Subjective  Pt presents for visit 19 tIntensive Phase CDT to BLE w/ initial Rx to RLE. Pt had fall during visit interval 2/2 pre-existing vertigo issue, and arrives today w/ LUE  casted from MPs to elbow for multiple wrist fractures. Pt reports need for sx is pending her F/u apt w/ her orthopedic doc.    Patient is accompained by: Family member   Pertinent History osteporosis; s/p gastric bypass 2003 ( s/p ~250# wt loss);  strong + family hx for BLE lymphedema- mother and maternal grandmother; BLE swelling onset early adulthood; frequent falls ( 3-4 in last month with frequent loss of balance   Limitations difficlty walking, standing, lower body dressing , fitting LB clothing and street shoes, transfers, bed moility, bathing, performing hygine, falls, limits participation in home management and productive work/  volunterr activities, limits ability to participate in  sociall family and travel activivities   Patient Stated Goals improve unctional ambulation, ability to transfer and move around in bed, and increase activity level w/ less sedentary hours ( Current Level of satiscation with performance of all activities rated as 10 on 1-10 scale w/ 1 most satified and 10 least satisfied)most dissatisfied) on 1-10 scall   Currently in Pain? No/denies                              OT Education - 08/01/15 1054    Education provided Yes   Education Details Reviewed adapted techniques for simple self MLD since Pt has LUE fracture and is unable to perform with typical positioning. Provided MLD edu for spouse, who demonstrated correct J stroke technique by end of session with VC and SBA   Person(s) Educated Spouse;Patient   Methods Explanation;Demonstration;Tactile cues;Verbal cues;Handout   Comprehension Verbalized understanding;Returned demonstration;Verbal cues required;Tactile cues required             OT Long Term Goals - 06/14/15 1415    OT LONG TERM GOAL #1   Title Pt able to correctly apply gradient compression wraps from toes to groiin with Max assistance from caregiver within 2 weeks for optimal limb volume reduction and LE self management over time.- Pt is independent w/ compression wraps. No  CG assistance required.   Baseline dependent   Time 2   Period Weeks   Status Achieved   OT LONG TERM GOAL #2   Title Pt to achieve 20% limb volume reductions bilaterally by DC to limit lymphedema (LE) progressio, to limit infection risk, to decrease falls risk, and to improve functional mobility and ambulation.-Partially met on LLE w/ 13.67% limb volume reduction measured today.   Baseline dependent   Time 12   Period Weeks   Status Partially Met   OT LONG TERM GOAL #3   Title Pt mod assist and at least 85% compliant with all LE self-care protocols, including simple self-manual  lymphatic drainage (MLD), skin care, lymphatic pumping ther ex, and donning/ doffing progression garments.- Pt still learning protocols but shows excellent progress with skin care, wraps, simple self MLD, and ther ex.   Baseline dependent   Time 12   Period Weeks   Status Partially Met   OT LONG TERM GOAL #4   Title Pt to remain infection free throughout CDT course to limit infection and LE progression.   Baseline dependent   Time 12   Status Partially Met   OT LONG TERM GOAL #5   Title During Management Phase CDT Pt to sustain limb volume reductions achieved during Intensive Phase CDT within 5% utilizing LE self-care protocols, appropriate compression garments/ devices, and moderate caregiver assistance.   Baseline dependent   Time 6   Period Months   Status New   OT LONG TERM GOAL #6   Title Pt satisfaction with ability to perform safe functional ambulation to ienable her to complete food shopping activities to mprove from  level 10 ( totally unsatisfied)  to level 7 on a 1-10 scale with 1 being totally satisfied.   Baseline 10 ( totally unsatisfied on 1-10 scale   Time 12   Period Weeks   Status New   OT LONG TERM GOAL #7   Title Pt satisfaction with ability to perform safe functional mobility for car transfers, STS transfers, befd mobility, ytub/shower transfers)  to [facility] from  level 10 ( totally unsatisfied)  to level 7 on a 1-10 scale with 1 being totally satisfied.   Baseline 10 ( totally unsatisfied on 1-10 scale   Time 12   Period Weeks   Status New   OT LONG TERM GOAL #8   Title Pt satisfaction with current activity level needed  to participate in socail and community activities, and to complete ADLs, leisure and productive activities without excessive fatigue to mprove from  level 10 ( totally unsatisfied)  to level 7 on a 1-10 scale with 1 being totally satisfied.   Baseline 10 ( totally unsatisfied on 1-10 scale   Time 12   Period Weeks   Status New   OT LONG TERM  GOAL  #9   Baseline Pt able to reduce time being sedentary from 8 waking hours daily to 2 hours daily in order to increase social and community participation to improve overall life satisfaction.   Time 12   Period Weeks   Status New               Plan - 08/01/15 1057    Clinical Impression Statement Spouse able to perform intro level simple MLD to LUE and neck by end of session with skilled edu. Pt unable to perform LE self care at home between sessions without max A due to wrist fracture. LUE is quite swollen today so also taught  UE sequences to promote healing and swelling reduction. BLE mildly more swollen today as Pt was unable to apply wraps during visit interval.   Rehab Potential Good   OT Frequency 3x / week   OT Duration 12 weeks   OT Treatment/Interventions Self-care/ADL training;DME and/or AE instruction;Manual lymph drainage;Patient/family education;Compression bandaging;Therapeutic exercises;Therapeutic activities;Manual Therapy   Consulted and Agree with Plan of Care Patient;Family member/caregiver        Problem List Patient Active Problem List   Diagnosis Date Noted  . Vitamin D deficiency 08/20/2014  . Secondary hyperparathyroidism ([place]) 08/20/2014    Andrey Spearman, MS, OTR/L, [facility] 08/01/2015 11:01 AM   [place] MAIN [facility] SERVICES [address] [place], [place], [address] Phone: [phone]   Fax:  [phone]  Name: Teola A Burns MRN: [id number] Date of Birth: [birth year]

## 2015-08-06 ENCOUNTER — Ambulatory Visit: Payer: 59 | Admitting: Occupational Therapy

## 2015-08-12 ENCOUNTER — Encounter: Payer: 59 | Admitting: Occupational Therapy

## 2015-08-14 ENCOUNTER — Ambulatory Visit: Payer: 59 | Admitting: Occupational Therapy

## 2015-08-14 DIAGNOSIS — I890 Lymphedema, not elsewhere classified: Secondary | ICD-10-CM | POA: Diagnosis not present

## 2015-08-14 NOTE — Patient Instructions (Signed)
LE instructions and precautions as established- see initial eval.   

## 2015-08-14 NOTE — Therapy (Signed)
[place] MAIN [facility] SERVICES [address] [place], [place], [address] Phone: [phone]   Fax:  [phone]  Occupational Therapy Treatment  Patient Details  Name: [name] MRN: [id number] Date of Birth: Jan 12, 1951 No Data Recorded  Encounter Date: 08/14/2015      OT End of Session - 08/14/15 1736    Visit Number 20   Number of Visits 36   Date for OT Re-Evaluation 08/19/15   OT Start Time 1107   OT Stop Time 1215   OT Time Calculation (min) 68 min   Equipment Utilized During Treatment compression wraps- see TREATMENT secvtion for itemized list   Activity Tolerance Patient tolerated treatment well;No increased pain   Behavior During Therapy [facility] for tasks assessed/performed      Past Medical History  Diagnosis Date  . Peptic ulcer     Past Surgical History  Procedure Laterality Date  . Mini gastric bypass    . Cholecystectomy    . Breast surgery    . Knee surgery    . Shoulder surgery      There were no vitals filed for this visit.  Visit Diagnosis:  Lymphedema      Subjective Assessment - 08/14/15 1733    Subjective  Pt presents for visit 20 tIntensive Phase CDT to BLE w/ initial Rx to RLE. Pt had fall during visit interval 2/2 pre-existing vertigo issue, and arrives today w/ LUE  casted from MPs to elbow for multiple wrist fractures. Pt also wearing TLSO after recent dx for T12, L3,4,5 fractures from fall. Pt reports pain is well controlled. She  brings new perscription releasing her to resume CDT.   Patient is accompained by: Family member   Pertinent History osteporosis; s/p gastric bypass 2003 ( s/p ~250# wt loss);  strong + family hx for BLE lymphedema- mother and maternal grandmother; BLE swelling onset early adulthood; frequent falls ( 3-4 in last month with frequent loss of balance   Limitations difficlty walking, standing, lower body dressing , fitting LB clothing and street shoes, transfers, bed moility,  bathing, performing hygine, falls, limits participation in home management and productive work/ volunterr activities, limits ability to participate in  sociall family and travel activivities   Patient Stated Goals improve unctional ambulation, ability to transfer and move around in bed, and increase activity level w/ less sedentary hours ( Current Level of satiscation with performance of all activities rated as 10 on 1-10 scale w/ 1 most satified and 10 least satisfied)most dissatisfied) on 1-10 scall                      OT Treatments/Exercises (OP) - 08/14/15 0001    Manual Therapy   Manual Therapy Edema management;Manual Lymphatic Drainage (MLD);Compression Bandaging   Manual therapy comments skin care w/ low pH castor oil [facility]) during MLD   Manual Lymphatic Drainage (MLD) Manual lymph drainage (MLD) in supine utilizing functional inguinal lymph nodes and deep abdominal lymphatics as is customary for non-cancer related lower extremity LE, including bilateral "short neck" sequence, J strokes to sub and supraclavicular LN, deep abdominal pathways, functional inguinal LN, lower extremity proximal to distal w/ emphasis on medial knee bottleneck and politeal LN. Performed fibrosis technique to B maleoli and distal  legs to address fatty fibrosis. Good tolerance.   Compression Bandaging Added bumpy comprex foam pades to medial and lateral mallioli on L foot today in effort to breakdown fatty fibrosis at ankle. LLE gradient  compression wraps applied circumferentially in gradient configuration from toes to groin as follows: toe wrap x1 under cotton stockinett; 8 cm x 1 to foot and ankle, 10 cm x 2, then  12 cm x 2- all layered over .04 x 10 cm and 12 cm Rosidol Soft foam from A-G.                OT Education - 08/14/15 1734    Education provided Yes   Education Details Provided Pt edu for optimal upright posture utilizing pelvic tilt technique and HOS positioning to limit  kyphosis and increased strain on back, including sidelying w/ pillow between knees for neutral hips and spine, and pillows under knees when supine.   Person(s) Educated Patient;Spouse   Methods Explanation   Comprehension Verbalized understanding;Need further instruction             OT Long Term Goals - 06/14/15 1415    OT LONG TERM GOAL #1   Title Pt able to correctly apply gradient compression wraps from toes to groiin with Max assistance from caregiver within 2 weeks for optimal limb volume reduction and LE self management over time.- Pt is independent w/ compression wraps. No CG assistance required.   Baseline dependent   Time 2   Period Weeks   Status Achieved   OT LONG TERM GOAL #2   Title Pt to achieve 20% limb volume reductions bilaterally by DC to limit lymphedema (LE) progressio, to limit infection risk, to decrease falls risk, and to improve functional mobility and ambulation.-Partially met on LLE w/ 13.67% limb volume reduction measured today.   Baseline dependent   Time 12   Period Weeks   Status Partially Met   OT LONG TERM GOAL #3   Title Pt mod assist and at least 85% compliant with all LE self-care protocols, including simple self-manual lymphatic drainage (MLD), skin care, lymphatic pumping ther ex, and donning/ doffing progression garments.- Pt still learning protocols but shows excellent progress with skin care, wraps, simple self MLD, and ther ex.   Baseline dependent   Time 12   Period Weeks   Status Partially Met   OT LONG TERM GOAL #4   Title Pt to remain infection free throughout CDT course to limit infection and LE progression.   Baseline dependent   Time 12   Status Partially Met   OT LONG TERM GOAL #5   Title During Management Phase CDT Pt to sustain limb volume reductions achieved during Intensive Phase CDT within 5% utilizing LE self-care protocols, appropriate compression garments/ devices, and moderate caregiver assistance.   Baseline dependent    Time 6   Period Months   Status New   OT LONG TERM GOAL #6   Title Pt satisfaction with ability to perform safe functional ambulation to ienable her to complete food shopping activities to mprove from  level 10 ( totally unsatisfied)  to level 7 on a 1-10 scale with 1 being totally satisfied.   Baseline 10 ( totally unsatisfied on 1-10 scale   Time 12   Period Weeks   Status New   OT LONG TERM GOAL #7   Title Pt satisfaction with ability to perform safe functional mobility for car transfers, STS transfers, befd mobility, ytub/shower transfers)  to [facility] from  level 10 ( totally unsatisfied)  to level 7 on a 1-10 scale with 1 being totally satisfied.   Baseline 10 ( totally unsatisfied on 1-10 scale   Time 12   Period Weeks  Status New   OT LONG TERM GOAL #8   Title Pt satisfaction with current activity level needed  to participate in socail and community activities, and to complete ADLs, leisure and productive activities without excessive fatigue to mprove from  level 10 ( totally unsatisfied)  to level 7 on a 1-10 scale with 1 being totally satisfied.   Baseline 10 ( totally unsatisfied on 1-10 scale   Time 12   Period Weeks   Status New   OT LONG TERM GOAL  #9   Baseline Pt able to reduce time being sedentary from 8 waking hours daily to 2 hours daily in order to increase social and community participation to improve overall life satisfaction.   Time 12   Period Weeks   Status New               Plan - 08/14/15 1737    Clinical Impression Statement Pt tolerated treatment today without increased pain. Swelling responded w/ visible decrease and palpable softening by end of session. Encouraged Pt to utilize Flexitouch pump during visit intervals in comfortable , supported position on bed.   Pt will benefit from skilled therapeutic intervention in order to improve on the following deficits (Retired) Abnormal gait;Decreased range of motion;Difficulty walking;Impaired  flexibility;Increased edema;Decreased activity tolerance;Decreased knowledge of precautions;Decreased skin integrity;Decreased knowledge of use of DME;Pain;Decreased mobility;Impaired perceived functional ability   OT Frequency 3x / week   OT Duration 12 weeks   OT Treatment/Interventions Self-care/ADL training;DME and/or AE instruction;Manual lymph drainage;Patient/family education;Compression bandaging;Therapeutic exercises;Therapeutic activities;Manual Therapy        Problem List Patient Active Problem List   Diagnosis Date Noted  . Vitamin D deficiency 08/20/2014  . Secondary hyperparathyroidism ([place]) 08/20/2014    Andrey Spearman, MS, OTR/L, [facility] 08/14/2015 5:42 PM   [place] MAIN [facility] SERVICES [address] [place], [place], [address] Phone: [phone]   Fax:  [phone]  Name: [name] MRN: [id number] Date of Birth: [birth year]

## 2015-08-15 ENCOUNTER — Ambulatory Visit: Payer: 59 | Attending: Family Medicine | Admitting: Occupational Therapy

## 2015-08-15 DIAGNOSIS — I890 Lymphedema, not elsewhere classified: Secondary | ICD-10-CM | POA: Diagnosis not present

## 2015-08-15 DIAGNOSIS — R531 Weakness: Secondary | ICD-10-CM | POA: Diagnosis present

## 2015-08-15 DIAGNOSIS — R2681 Unsteadiness on feet: Secondary | ICD-10-CM | POA: Diagnosis present

## 2015-08-15 NOTE — Patient Instructions (Signed)
LE instructions and precautions as established- see initial eval.   

## 2015-08-15 NOTE — Therapy (Signed)
[place] MAIN [facility] SERVICES [address] [place], [place], [address] Phone: [phone]   Fax:  [phone]  Occupational Therapy Treatment  Patient Details  Name: [name] MRN: [id number] Date of Birth: 07-Oct-1950 No Data Recorded  Encounter Date: 08/15/2015      OT End of Session - 08/15/15 1136    Visit Number 21   Number of Visits 36   Date for OT Re-Evaluation 08/19/15   OT Start Time 1000   OT Stop Time 1122   OT Time Calculation (min) 82 min   Equipment Utilized During Treatment compression wraps- see TREATMENT secvtion for itemized list   Activity Tolerance Patient tolerated treatment well;No increased pain   Behavior During Therapy [facility] for tasks assessed/performed      Past Medical History  Diagnosis Date  . Peptic ulcer     Past Surgical History  Procedure Laterality Date  . Mini gastric bypass    . Cholecystectomy    . Breast surgery    . Knee surgery    . Shoulder surgery      There were no vitals filed for this visit.  Visit Diagnosis:  Lymphedema      Subjective Assessment - 08/15/15 1130    Subjective  Pt presents for visit 21 tIntensive Phase CDT to BLE w/ initial Rx to RLE. Pt reports she is unable to perform self BLE MLD with casted L arm. Pt reports increased R leg pain with standing and walking since falling and fracturing vertebrae. Call in to her doctor to verify activity level.    Patient is accompained by: Family member   Pertinent History osteporosis; s/p gastric bypass 2003 ( s/p ~250# wt loss);  strong + family hx for BLE lymphedema- mother and maternal grandmother; BLE swelling onset early adulthood; frequent falls ( 3-4 in last month with frequent loss of balance   Limitations difficlty walking, standing, lower body dressing , fitting LB clothing and street shoes, transfers, bed moility, bathing, performing hygine, falls, limits participation in home management and productive work/  volunterr activities, limits ability to participate in  sociall family and travel activivities   Patient Stated Goals improve unctional ambulation, ability to transfer and move around in bed, and increase activity level w/ less sedentary hours ( Current Level of satiscation with performance of all activities rated as 10 on 1-10 scale w/ 1 most satified and 10 least satisfied)most dissatisfied) on 1-10 scall                      OT Treatments/Exercises (OP) - 08/15/15 0001    ADLs   ADL Education Given Yes   Manual Therapy   Manual Therapy Edema management;Manual Lymphatic Drainage (MLD);Compression Bandaging   Manual therapy comments skin care w/ low pH castor oil [facility]) during MLD   Manual Lymphatic Drainage (MLD) Manual lymph drainage (MLD) in supine utilizing functional inguinal lymph nodes and deep abdominal lymphatics as is customary for non-cancer related lower extremity LE, including bilateral "short neck" sequence, J strokes to sub and supraclavicular LN, deep abdominal pathways, functional inguinal LN, lower extremity proximal to distal w/ emphasis on medial knee bottleneck and politeal LN. Performed fibrosis technique to B maleoli and distal  legs to address fatty fibrosis. Good tolerance.   Compression Bandaging Added bumpy comprex foam pades to medial and lateral mallioli on L foot today in effort to breakdown fatty fibrosis at ankle. LLE gradient compression wraps applied circumferentially in gradient  configuration from toes to groin as follows: toe wrap x1 under cotton stockinett; 8 cm x 1 to foot and ankle, 10 cm x 2, then  12 cm x 2- all layered over .04 x 10 cm and 12 cm Rosidol Soft foam from A-G.                OT Education - 08/15/15 1133    Education provided Yes   Education Details .Emphasis of skilled LE self-care training today on compression device rational, configuration options and recommendations.  Pt opting for half leg Jovi x 2.    Person(s) Educated Patient;Spouse   Methods Explanation;Demonstration   Comprehension Verbalized understanding;Need further instruction             OT Long Term Goals - 06/14/15 1415    OT LONG TERM GOAL #1   Title Pt able to correctly apply gradient compression wraps from toes to groiin with Max assistance from caregiver within 2 weeks for optimal limb volume reduction and LE self management over time.- Pt is independent w/ compression wraps. No CG assistance required.   Baseline dependent   Time 2   Period Weeks   Status Achieved   OT LONG TERM GOAL #2   Title Pt to achieve 20% limb volume reductions bilaterally by DC to limit lymphedema (LE) progressio, to limit infection risk, to decrease falls risk, and to improve functional mobility and ambulation.-Partially met on LLE w/ 13.67% limb volume reduction measured today.   Baseline dependent   Time 12   Period Weeks   Status Partially Met   OT LONG TERM GOAL #3   Title Pt mod assist and at least 85% compliant with all LE self-care protocols, including simple self-manual lymphatic drainage (MLD), skin care, lymphatic pumping ther ex, and donning/ doffing progression garments.- Pt still learning protocols but shows excellent progress with skin care, wraps, simple self MLD, and ther ex.   Baseline dependent   Time 12   Period Weeks   Status Partially Met   OT LONG TERM GOAL #4   Title Pt to remain infection free throughout CDT course to limit infection and LE progression.   Baseline dependent   Time 12   Status Partially Met   OT LONG TERM GOAL #5   Title During Management Phase CDT Pt to sustain limb volume reductions achieved during Intensive Phase CDT within 5% utilizing LE self-care protocols, appropriate compression garments/ devices, and moderate caregiver assistance.   Baseline dependent   Time 6   Period Months   Status New   OT LONG TERM GOAL #6   Title Pt satisfaction with ability to perform safe functional  ambulation to ienable her to complete food shopping activities to mprove from  level 10 ( totally unsatisfied)  to level 7 on a 1-10 scale with 1 being totally satisfied.   Baseline 10 ( totally unsatisfied on 1-10 scale   Time 12   Period Weeks   Status New   OT LONG TERM GOAL #7   Title Pt satisfaction with ability to perform safe functional mobility for car transfers, STS transfers, befd mobility, ytub/shower transfers)  to [facility] from  level 10 ( totally unsatisfied)  to level 7 on a 1-10 scale with 1 being totally satisfied.   Baseline 10 ( totally unsatisfied on 1-10 scale   Time 12   Period Weeks   Status New   OT LONG TERM GOAL #8   Title Pt satisfaction with current activity level needed  to participate in socail and community activities, and to complete ADLs, leisure and productive activities without excessive fatigue to mprove from  level 10 ( totally unsatisfied)  to level 7 on a 1-10 scale with 1 being totally satisfied.   Baseline 10 ( totally unsatisfied on 1-10 scale   Time 12   Period Weeks   Status New   OT LONG TERM GOAL  #9   Baseline Pt able to reduce time being sedentary from 8 waking hours daily to 2 hours daily in order to increase social and community participation to improve overall life satisfaction.   Time 12   Period Weeks   Status New               Plan - 08/15/15 1137    Clinical Impression Statement Recent fall resulting in L wrist fracture and spinal stress fractures limit Pt's ability to perform LE self care regimes without max assistance from spouse. Pt is utilizing pump daily to supplement self MLD. Reviewed HOS device options and team agreed on knee length JoviPak for improved tolerance and comfort and donning/ doffing.    Pt will benefit from skilled therapeutic intervention in order to improve on the following deficits (Retired) Abnormal gait;Decreased range of motion;Difficulty walking;Impaired flexibility;Increased edema;Decreased activity  tolerance;Decreased knowledge of precautions;Decreased skin integrity;Decreased knowledge of use of DME;Pain;Decreased mobility;Impaired perceived functional ability   Rehab Potential Good   OT Frequency 3x / week   OT Duration 12 weeks   OT Treatment/Interventions Self-care/ADL training;DME and/or AE instruction;Manual lymph drainage;Patient/family education;Compression bandaging;Therapeutic exercises;Therapeutic activities;Manual Therapy        Problem List Patient Active Problem List   Diagnosis Date Noted  . Vitamin D deficiency 08/20/2014  . Secondary hyperparathyroidism ([place]) 08/20/2014    Andrey Spearman, MS, OTR/L, [facility] 08/15/2015 11:41 AM  [place] MAIN [facility] SERVICES [address] [place], [place], [address] Phone: [phone]   Fax:  [phone]  Name: Eustolia A Burns MRN: [id number] Date of Birth: 05-06-1951

## 2015-08-16 ENCOUNTER — Other Ambulatory Visit: Payer: Self-pay | Admitting: Physical Medicine and Rehabilitation

## 2015-08-16 DIAGNOSIS — S32040A Wedge compression fracture of fourth lumbar vertebra, initial encounter for closed fracture: Secondary | ICD-10-CM

## 2015-08-16 DIAGNOSIS — S22080A Wedge compression fracture of T11-T12 vertebra, initial encounter for closed fracture: Secondary | ICD-10-CM

## 2015-08-19 ENCOUNTER — Ambulatory Visit: Payer: 59 | Admitting: Occupational Therapy

## 2015-08-20 ENCOUNTER — Other Ambulatory Visit: Payer: Self-pay | Admitting: Physical Medicine and Rehabilitation

## 2015-08-20 ENCOUNTER — Ambulatory Visit
Admission: RE | Admit: 2015-08-20 | Discharge: 2015-08-20 | Disposition: A | Discharge status: Home / Self Care | Payer: 59 | Source: Ambulatory Visit | Attending: Physical Medicine and Rehabilitation | Admitting: Physical Medicine and Rehabilitation

## 2015-08-20 ENCOUNTER — Ambulatory Visit: Payer: 59 | Admitting: Occupational Therapy

## 2015-08-20 DIAGNOSIS — S22080A Wedge compression fracture of T11-T12 vertebra, initial encounter for closed fracture: Secondary | ICD-10-CM

## 2015-08-20 DIAGNOSIS — S32040A Wedge compression fracture of fourth lumbar vertebra, initial encounter for closed fracture: Secondary | ICD-10-CM

## 2015-08-20 DIAGNOSIS — S32000B Wedge compression fracture of unspecified lumbar vertebra, initial encounter for open fracture: Secondary | ICD-10-CM

## 2015-08-22 ENCOUNTER — Encounter: Payer: 59 | Admitting: Occupational Therapy

## 2015-08-23 ENCOUNTER — Other Ambulatory Visit: Payer: 59

## 2015-08-26 ENCOUNTER — Ambulatory Visit: Payer: 59 | Admitting: Occupational Therapy

## 2015-08-26 DIAGNOSIS — I890 Lymphedema, not elsewhere classified: Secondary | ICD-10-CM

## 2015-08-26 NOTE — Therapy (Signed)
[place] MAIN [facility] SERVICES [address] [place], [place], [address] Phone: [phone]   Fax:  [phone]  Occupational Therapy Treatment & Re-certification Note  Patient Details  Name: [name] MRN: [id number] Date of Birth: 07-Dec-1950 No Data Recorded  Encounter Date: 08/26/2015      OT End of Session - 08/26/15 1451    Visit Number 22   Number of Visits 36   Date for OT Re-Evaluation 08/19/15   OT Start Time 1308   OT Stop Time 1408   OT Time Calculation (min) 60 min   Equipment Utilized During Treatment compression wraps- see TREATMENT secvtion for itemized list   Activity Tolerance Patient tolerated treatment well;No increased pain   Behavior During Therapy [facility] for tasks assessed/performed      Past Medical History  Diagnosis Date  . Peptic ulcer     Past Surgical History  Procedure Laterality Date  . Mini gastric bypass    . Cholecystectomy    . Breast surgery    . Knee surgery    . Shoulder surgery      There were no vitals filed for this visit.  Visit Diagnosis:  Lymphedema - Plan: Ot plan of care cert/re-cert      Subjective Assessment - 08/26/15 1447    Subjective  Pt presents for visit 22 for Intensive Phase CDT to BLE . Pt reports upcoming back sx scheduled for thjs coming thursday is expected to alleviate pain and improved functional performance.   Patient is accompained by: Family member   Pertinent History osteporosis; s/p gastric bypass 2003 ( s/p ~250# wt loss);  strong + family hx for BLE lymphedema- mother and maternal grandmother; BLE swelling onset early adulthood; frequent falls ( 3-4 in last month with frequent loss of balance   Limitations difficlty walking, standing, lower body dressing , fitting LB clothing and street shoes, transfers, bed moility, bathing, performing hygine, falls, limits participation in home management and productive work/ volunterr activities, limits ability to  participate in  sociall family and travel activivities   Patient Stated Goals improve unctional ambulation, ability to transfer and move around in bed, and increase activity level w/ less sedentary hours ( Current Level of satiscation with performance of all activities rated as 10 on 1-10 scale w/ 1 most satified and 10 least satisfied)most dissatisfied) on 1-10 scall   Currently in Pain? Yes  back pain 2/2 recent injury   Pain Score 7                               OT Education - 08/26/15 1449    Education provided Yes   Education Details Emphasis of Pt and CG edu today for LE self-management on understanding garment porocurement  and measurement processes.Facilitated phone call to vendor to get clarification on insurance denial for compression garments.   Person(s) Educated Patient;Spouse   Methods Explanation   Comprehension Verbalized understanding;Need further instruction             OT Long Term Goals - 06/14/15 1415    OT LONG TERM GOAL #1   Title Pt able to correctly apply gradient compression wraps from toes to groiin with Max assistance from caregiver within 2 weeks for optimal limb volume reduction and LE self management over time.- Pt is independent w/ compression wraps. No CG assistance required.   Baseline dependent   Time 2   Period Weeks  Status Achieved   OT LONG TERM GOAL #2   Title Pt to achieve 20% limb volume reductions bilaterally by DC to limit lymphedema (LE) progressio, to limit infection risk, to decrease falls risk, and to improve functional mobility and ambulation.-Partially met on LLE w/ 13.67% limb volume reduction measured today.   Baseline dependent   Time 12   Period Weeks   Status Partially Met   OT LONG TERM GOAL #3   Title Pt mod assist and at least 85% compliant with all LE self-care protocols, including simple self-manual lymphatic drainage (MLD), skin care, lymphatic pumping ther ex, and donning/ doffing progression  garments.- Pt still learning protocols but shows excellent progress with skin care, wraps, simple self MLD, and ther ex.   Baseline dependent   Time 12   Period Weeks   Status Partially Met   OT LONG TERM GOAL #4   Title Pt to remain infection free throughout CDT course to limit infection and LE progression.   Baseline dependent   Time 12   Status Partially Met   OT LONG TERM GOAL #5   Title During Management Phase CDT Pt to sustain limb volume reductions achieved during Intensive Phase CDT within 5% utilizing LE self-care protocols, appropriate compression garments/ devices, and moderate caregiver assistance.   Baseline dependent   Time 6   Period Months   Status New   OT LONG TERM GOAL #6   Title Pt satisfaction with ability to perform safe functional ambulation to ienable her to complete food shopping activities to mprove from  level 10 ( totally unsatisfied)  to level 7 on a 1-10 scale with 1 being totally satisfied.   Baseline 10 ( totally unsatisfied on 1-10 scale   Time 12   Period Weeks   Status New   OT LONG TERM GOAL #7   Title Pt satisfaction with ability to perform safe functional mobility for car transfers, STS transfers, befd mobility, ytub/shower transfers)  to [facility] from  level 10 ( totally unsatisfied)  to level 7 on a 1-10 scale with 1 being totally satisfied.   Baseline 10 ( totally unsatisfied on 1-10 scale   Time 12   Period Weeks   Status New   OT LONG TERM GOAL #8   Title Pt satisfaction with current activity level needed  to participate in socail and community activities, and to complete ADLs, leisure and productive activities without excessive fatigue to mprove from  level 10 ( totally unsatisfied)  to level 7 on a 1-10 scale with 1 being totally satisfied.   Baseline 10 ( totally unsatisfied on 1-10 scale   Time 12   Period Weeks   Status New   OT LONG TERM GOAL  #9   Baseline Pt able to reduce time being sedentary from 8 waking hours daily to 2 hours  daily in order to increase social and community participation to improve overall life satisfaction.   Time 12   Period Weeks   Status New               Plan - 08/26/15 1453    Clinical Impression Statement Pt cointinues to be dependent for all aspects of LE self care. She admits she has not been wrapping between visits as it is just too difficult to get comfortable with back brqce and cast. Pt is diligently utilizing pneumatic pump between visits, which is helping to maintain limb volumes to fair degree.   Pt will benefit from skilled therapeutic intervention  in order to improve on the following deficits (Retired) Abnormal gait;Decreased range of motion;Difficulty walking;Impaired flexibility;Increased edema;Decreased activity tolerance;Decreased knowledge of precautions;Decreased skin integrity;Decreased knowledge of use of DME;Pain;Decreased mobility;Impaired perceived functional ability   Rehab Potential Good   OT Frequency 3x / week   OT Duration 12 weeks   OT Treatment/Interventions Self-care/ADL training;DME and/or AE instruction;Manual lymph drainage;Patient/family education;Compression bandaging;Therapeutic exercises;Therapeutic activities;Manual Therapy        Problem List Patient Active Problem List   Diagnosis Date Noted  . Vitamin D deficiency 08/20/2014  . Secondary hyperparathyroidism ([place]) 08/20/2014    Andrey Spearman, MS, OTR/L, [facility] 08/26/2015 2:59 PM   [place] MAIN [facility] SERVICES [address] [place], [place], [address] Phone: [phone]   Fax:  [phone]  Name: [name] MRN: [id number] Date of Birth: September 12, 1951

## 2015-08-28 ENCOUNTER — Ambulatory Visit: Payer: 59 | Admitting: Occupational Therapy

## 2015-08-28 DIAGNOSIS — I890 Lymphedema, not elsewhere classified: Secondary | ICD-10-CM | POA: Diagnosis not present

## 2015-08-28 NOTE — Discharge Instructions (Signed)
Vertebroplasty Post Procedure Discharge Instructions  1. May resume a regular diet and any medications that you routinely take (including pain medications). 2. No driving day of procedure. 3. Upon discharge go home and rest for at least 4 hours.  May use an ice pack as needed to injection sites on back. 4. Remove bandages after shower tomorrow. Change to bandaides and change daily till healed. 5. Do no pick up anything heavier than a milk jug. 6. Follow up with the [facility] in 2 weeks.    Please contact our office at [phone] for the following symptoms:   Fever greater than 100 degrees  Increased swelling, pain, or redness at injection site.   Thank you for visiting [facility].

## 2015-08-28 NOTE — Patient Instructions (Signed)
LE instructions and precautions as established- see initial eval.   

## 2015-08-28 NOTE — Therapy (Signed)
[place] MAIN [facility] SERVICES [address] [place], [place], [address] Phone: [phone]   Fax:  [phone]  Occupational Therapy Treatment  Patient Details  Name: [name] MRN: [id number] Date of Birth: Dec 04, 1950 No Data Recorded  Encounter Date: 08/28/2015      OT End of Session - 08/28/15 1625    Visit Number 23   Number of Visits 36   Date for OT Re-Evaluation 08/19/15   OT Start Time 1011   OT Stop Time 1110   OT Time Calculation (min) 59 min   Equipment Utilized During Treatment compression wraps- see TREATMENT secvtion for itemized list   Activity Tolerance Patient tolerated treatment well;No increased pain   Behavior During Therapy [facility] for tasks assessed/performed      Past Medical History  Diagnosis Date  . Peptic ulcer     Past Surgical History  Procedure Laterality Date  . Mini gastric bypass    . Cholecystectomy    . Breast surgery    . Knee surgery    . Shoulder surgery      There were no vitals filed for this visit.  Visit Diagnosis:  Lymphedema      Subjective Assessment - 08/28/15 1617    Subjective  Pt presents for visit 23 for Intensive Phase CDT to BLE . Pt reports she is increased back pain today and is hopeful that upcoming surgery will be alleviate that ASAP. Series of phone calls w/ DME vendors confirms that Pt's [facility] insurance does not pay for any LE compression for LE and her expense for these will be out of pocket.    Patient is accompained by: Family member   Pertinent History osteporosis; s/p gastric bypass 2003 ( s/p ~250# wt loss);  strong + family hx for BLE lymphedema- mother and maternal grandmother; BLE swelling onset early adulthood; frequent falls ( 3-4 in last month with frequent loss of balance   Limitations difficlty walking, standing, lower body dressing , fitting LB clothing and street shoes, transfers, bed moility, bathing, performing hygine, falls, limits participation in  home management and productive work/ volunterr activities, limits ability to participate in  sociall family and travel activivities   Patient Stated Goals improve unctional ambulation, ability to transfer and move around in bed, and increase activity level w/ less sedentary hours ( Current Level of satiscation with performance of all activities rated as 10 on 1-10 scale w/ 1 most satified and 10 least satisfied)most dissatisfied) on 1-10 scall                      OT Treatments/Exercises (OP) - 08/28/15 0001    ADLs   ADL Education Given Yes   Manual Therapy   Manual Therapy Edema management;Manual Lymphatic Drainage (MLD);Compression Bandaging   Manual therapy comments skin care w/ low pH castor oil [facility]) during MLD   Manual Lymphatic Drainage (MLD) Manual lymph drainage (MLD) in supine utilizing functional inguinal lymph nodes and deep abdominal lymphatics as is customary for non-cancer related lower extremity LE, including bilateral "short neck" sequence, J strokes to sub and supraclavicular LN, deep abdominal pathways, functional inguinal LN, lower extremity proximal to distal w/ emphasis on medial knee bottleneck and politeal LN. Performed fibrosis technique to B maleoli and distal  legs to address fatty fibrosis. Good tolerance.   Compression Bandaging Added bumpy comprex foam pades to medial and lateral mallioli on L foot today in effort to breakdown fatty fibrosis at  ankle. LLE gradient compression wraps applied circumferentially in gradient configuration from toes to groin as follows: toe wrap x1 under cotton stockinett; 8 cm x 1 to foot and ankle, 10 cm x 2, then  12 cm x 2- all layered over .04 x 10 cm and 12 cm Rosidol Soft foam from A-G.                OT Education - 08/28/15 1623    Education provided Yes   Education Details Emphasis of skilled LE self-care training today on compression garment/ device proper fit and function,  wear and care regimes,  and donning and doffing using assistive devices. Reviewed rational for garment specifications and recommedations for brand and fabric choice.   Person(s) Educated Patient;Spouse   Methods Explanation   Comprehension Verbalized understanding;Need further instruction             OT Long Term Goals - 06/14/15 1415    OT LONG TERM GOAL #1   Title Pt able to correctly apply gradient compression wraps from toes to groiin with Max assistance from caregiver within 2 weeks for optimal limb volume reduction and LE self management over time.- Pt is independent w/ compression wraps. No CG assistance required.   Baseline dependent   Time 2   Period Weeks   Status Achieved   OT LONG TERM GOAL #2   Title Pt to achieve 20% limb volume reductions bilaterally by DC to limit lymphedema (LE) progressio, to limit infection risk, to decrease falls risk, and to improve functional mobility and ambulation.-Partially met on LLE w/ 13.67% limb volume reduction measured today.   Baseline dependent   Time 12   Period Weeks   Status Partially Met   OT LONG TERM GOAL #3   Title Pt mod assist and at least 85% compliant with all LE self-care protocols, including simple self-manual lymphatic drainage (MLD), skin care, lymphatic pumping ther ex, and donning/ doffing progression garments.- Pt still learning protocols but shows excellent progress with skin care, wraps, simple self MLD, and ther ex.   Baseline dependent   Time 12   Period Weeks   Status Partially Met   OT LONG TERM GOAL #4   Title Pt to remain infection free throughout CDT course to limit infection and LE progression.   Baseline dependent   Time 12   Status Partially Met   OT LONG TERM GOAL #5   Title During Management Phase CDT Pt to sustain limb volume reductions achieved during Intensive Phase CDT within 5% utilizing LE self-care protocols, appropriate compression garments/ devices, and moderate caregiver assistance.   Baseline dependent   Time  6   Period Months   Status New   OT LONG TERM GOAL #6   Title Pt satisfaction with ability to perform safe functional ambulation to ienable her to complete food shopping activities to mprove from  level 10 ( totally unsatisfied)  to level 7 on a 1-10 scale with 1 being totally satisfied.   Baseline 10 ( totally unsatisfied on 1-10 scale   Time 12   Period Weeks   Status New   OT LONG TERM GOAL #7   Title Pt satisfaction with ability to perform safe functional mobility for car transfers, STS transfers, befd mobility, ytub/shower transfers)  to [facility] from  level 10 ( totally unsatisfied)  to level 7 on a 1-10 scale with 1 being totally satisfied.   Baseline 10 ( totally unsatisfied on 1-10 scale   Time 12  Period Weeks   Status New   OT LONG TERM GOAL #8   Title Pt satisfaction with current activity level needed  to participate in socail and community activities, and to complete ADLs, leisure and productive activities without excessive fatigue to mprove from  level 10 ( totally unsatisfied)  to level 7 on a 1-10 scale with 1 being totally satisfied.   Baseline 10 ( totally unsatisfied on 1-10 scale   Time 12   Period Weeks   Status New   OT LONG TERM GOAL  #9   Baseline Pt able to reduce time being sedentary from 8 waking hours daily to 2 hours daily in order to increase social and community participation to improve overall life satisfaction.   Time 12   Period Weeks   Status New               Plan - 08/28/15 1621    Clinical Impression Statement In addition to manual thewrapy today also assisted Pt to schedule BLE cmpression garment  measuments w/ DME vendor  this coming  tuesday to expedite.We agreed BLE can be measured at present due to hardship of CDT concurrent w/ recent medical problems limiting tolerance for travel and participation in compression wrapping.   Pt will benefit from skilled therapeutic intervention in order to improve on the following deficits (Retired)  Abnormal gait;Decreased range of motion;Difficulty walking;Impaired flexibility;Increased edema;Decreased activity tolerance;Decreased knowledge of precautions;Decreased skin integrity;Decreased knowledge of use of DME;Pain;Decreased mobility;Impaired perceived functional ability   Rehab Potential Good   OT Frequency 3x / week   OT Duration 12 weeks   OT Treatment/Interventions Self-care/ADL training;DME and/or AE instruction;Manual lymph drainage;Patient/family education;Compression bandaging;Therapeutic exercises;Therapeutic activities;Manual Therapy        Problem List Patient Active Problem List   Diagnosis Date Noted  . Vitamin D deficiency 08/20/2014  . Secondary hyperparathyroidism ([place]) 08/20/2014   Andrey Spearman, MS, OTR/L, [facility] 08/28/2015 4:28 PM  [place] MAIN [facility] SERVICES [address] [place], [place], [address] Phone: [phone]   Fax:  [phone]  Name: [name] MRN: [id number] Date of Birth: September 21, 1950

## 2015-08-29 ENCOUNTER — Other Ambulatory Visit: Payer: Self-pay | Admitting: Physical Medicine and Rehabilitation

## 2015-08-29 ENCOUNTER — Encounter: Payer: 59 | Admitting: Occupational Therapy

## 2015-08-29 ENCOUNTER — Ambulatory Visit
Admission: RE | Admit: 2015-08-29 | Discharge: 2015-08-29 | Disposition: A | Discharge status: Home / Self Care | Payer: 59 | Source: Ambulatory Visit | Attending: Physical Medicine and Rehabilitation | Admitting: Physical Medicine and Rehabilitation

## 2015-08-29 VITALS — BP 103/60 | HR 63 | Temp 97.8°F | Resp 10

## 2015-08-29 DIAGNOSIS — S32000B Wedge compression fracture of unspecified lumbar vertebra, initial encounter for open fracture: Secondary | ICD-10-CM

## 2015-08-29 DIAGNOSIS — S32000G Wedge compression fracture of unspecified lumbar vertebra, subsequent encounter for fracture with delayed healing: Secondary | ICD-10-CM

## 2015-08-29 MED ORDER — MIDAZOLAM HCL 2 MG/2ML IJ SOLN
1.0000 mg | INTRAMUSCULAR | Status: DC | PRN
  Administered 2015-08-29 (×2): 0.5 mg via INTRAVENOUS

## 2015-08-29 MED ORDER — FENTANYL CITRATE (PF) 100 MCG/2ML IJ SOLN
25.0000 ug | INTRAMUSCULAR | Status: DC | PRN
  Administered 2015-08-29 (×4): 25 ug via INTRAVENOUS

## 2015-08-29 MED ORDER — CEFAZOLIN SODIUM-DEXTROSE 2-3 GM-% IV SOLR
2.0000 g | Freq: Once | INTRAVENOUS | Status: AC
  Administered 2015-08-29: 2 g via INTRAVENOUS

## 2015-08-29 MED ORDER — KETOROLAC TROMETHAMINE 30 MG/ML IJ SOLN
30.0000 mg | Freq: Once | INTRAMUSCULAR | Status: AC
  Administered 2015-08-29: 30 mg via INTRAVENOUS

## 2015-08-29 MED ORDER — SODIUM CHLORIDE 0.9 % IV SOLN
Freq: Once | INTRAVENOUS | Status: AC
  Administered 2015-08-29: 08:00:00 via INTRAVENOUS

## 2015-08-29 NOTE — Progress Notes (Signed)
Discharge instructions explained to pt. 

## 2015-08-30 ENCOUNTER — Encounter: Payer: 59 | Admitting: Occupational Therapy

## 2015-09-02 ENCOUNTER — Ambulatory Visit: Payer: 59 | Admitting: Occupational Therapy

## 2015-09-04 ENCOUNTER — Ambulatory Visit: Payer: 59 | Admitting: Occupational Therapy

## 2015-09-04 DIAGNOSIS — I890 Lymphedema, not elsewhere classified: Secondary | ICD-10-CM

## 2015-09-04 NOTE — Therapy (Signed)
[place] MAIN [facility] SERVICES [address] [place], [place], [address] Phone: [phone]   Fax:  [phone]  Occupational Therapy Treatment  Patient Details  Name: [name] MRN: [id number] Date of Birth: 1951/01/19 No Data Recorded  Encounter Date: 09/04/2015      OT End of Session - 09/04/15 1456    Visit Number 24   Number of Visits 36   Date for OT Re-Evaluation 08/19/15   OT Start Time 1300   OT Stop Time 1400   OT Time Calculation (min) 60 min   Equipment Utilized During Treatment compression wraps- see TREATMENT secvtion for itemized list   Activity Tolerance Patient tolerated treatment well;No increased pain   Behavior During Therapy [facility] for tasks assessed/performed      Past Medical History  Diagnosis Date  . Peptic ulcer     Past Surgical History  Procedure Laterality Date  . Mini gastric bypass    . Cholecystectomy    . Breast surgery    . Knee surgery    . Shoulder surgery      There were no vitals filed for this visit.  Visit Diagnosis:  Lymphedema      Subjective Assessment - 09/04/15 1453    Subjective  Pt presents for visit 24 for Intensive Phase CDT to BLE . Pt reports she completed compression garment and device measurments for BLE yesterday with vendor.Pt opting for knee length Comfort vs full leg. OT emailed vendor.   Patient is accompained by: Family member   Pertinent History osteporosis; s/p gastric bypass 2003 ( s/p ~250# wt loss);  strong + family hx for BLE lymphedema- mother and maternal grandmother; BLE swelling onset early adulthood; frequent falls ( 3-4 in last month with frequent loss of balance   Limitations difficlty walking, standing, lower body dressing , fitting LB clothing and street shoes, transfers, bed moility, bathing, performing hygine, falls, limits participation in home management and productive work/ volunterr activities, limits ability to participate in  sociall family and  travel activivities   Patient Stated Goals improve unctional ambulation, ability to transfer and move around in bed, and increase activity level w/ less sedentary hours ( Current Level of satiscation with performance of all activities rated as 10 on 1-10 scale w/ 1 most satified and 10 least satisfied)most dissatisfied) on 1-10 scall   Currently in Pain? No/denies                      OT Treatments/Exercises (OP) - 09/04/15 0001    ADLs   ADL Education Given Yes   Manual Therapy   Manual Therapy Edema management;Manual Lymphatic Drainage (MLD);Compression Bandaging   Manual therapy comments skin care w/ low pH castor oil [facility]) during MLD   Manual Lymphatic Drainage (MLD) Manual lymph drainage (MLD) in supine utilizing functional inguinal lymph nodes and deep abdominal lymphatics as is customary for non-cancer related lower extremity LE, including bilateral "short neck" sequence, J strokes to sub and supraclavicular LN, deep abdominal pathways, functional inguinal LN, lower extremity proximal to distal w/ emphasis on medial knee bottleneck and politeal LN. Performed fibrosis technique to B maleoli and distal  legs to address fatty fibrosis. Good tolerance.   Compression Bandaging Added bumpy comprex foam pades to medial and lateral mallioli on L foot today in effort to breakdown fatty fibrosis at ankle. LLE gradient compression wraps applied circumferentially in gradient configuration from toes to groin as follows: toe wrap x1 under  cotton stockinett; 8 cm x 1 to foot and ankle, 10 cm x 2, then  12 cm x 2- all layered over .04 x 10 cm and 12 cm Rosidol Soft foam from A-G.                OT Education - 09/04/15 1455    Education provided Yes   Education Details Continued skilled Pt/caregiver Education  And LE ADL training throughout visit for lymphedema self care, including compression wrapping, compression garment and device wear/care, lymphatic pumping ther ex,  simple self-MLD, and skin care. Discussed progress towards goals.             OT Long Term Goals - 06/14/15 1415    OT LONG TERM GOAL #1   Title Pt able to correctly apply gradient compression wraps from toes to groiin with Max assistance from caregiver within 2 weeks for optimal limb volume reduction and LE self management over time.- Pt is independent w/ compression wraps. No CG assistance required.   Baseline dependent   Time 2   Period Weeks   Status Achieved   OT LONG TERM GOAL #2   Title Pt to achieve 20% limb volume reductions bilaterally by DC to limit lymphedema (LE) progressio, to limit infection risk, to decrease falls risk, and to improve functional mobility and ambulation.-Partially met on LLE w/ 13.67% limb volume reduction measured today.   Baseline dependent   Time 12   Period Weeks   Status Partially Met   OT LONG TERM GOAL #3   Title Pt mod assist and at least 85% compliant with all LE self-care protocols, including simple self-manual lymphatic drainage (MLD), skin care, lymphatic pumping ther ex, and donning/ doffing progression garments.- Pt still learning protocols but shows excellent progress with skin care, wraps, simple self MLD, and ther ex.   Baseline dependent   Time 12   Period Weeks   Status Partially Met   OT LONG TERM GOAL #4   Title Pt to remain infection free throughout CDT course to limit infection and LE progression.   Baseline dependent   Time 12   Status Partially Met   OT LONG TERM GOAL #5   Title During Management Phase CDT Pt to sustain limb volume reductions achieved during Intensive Phase CDT within 5% utilizing LE self-care protocols, appropriate compression garments/ devices, and moderate caregiver assistance.   Baseline dependent   Time 6   Period Months   Status New   OT LONG TERM GOAL #6   Title Pt satisfaction with ability to perform safe functional ambulation to ienable her to complete food shopping activities to mprove from   level 10 ( totally unsatisfied)  to level 7 on a 1-10 scale with 1 being totally satisfied.   Baseline 10 ( totally unsatisfied on 1-10 scale   Time 12   Period Weeks   Status New   OT LONG TERM GOAL #7   Title Pt satisfaction with ability to perform safe functional mobility for car transfers, STS transfers, befd mobility, ytub/shower transfers)  to [facility] from  level 10 ( totally unsatisfied)  to level 7 on a 1-10 scale with 1 being totally satisfied.   Baseline 10 ( totally unsatisfied on 1-10 scale   Time 12   Period Weeks   Status New   OT LONG TERM GOAL #8   Title Pt satisfaction with current activity level needed  to participate in socail and community activities, and to complete ADLs, leisure and productive  activities without excessive fatigue to mprove from  level 10 ( totally unsatisfied)  to level 7 on a 1-10 scale with 1 being totally satisfied.   Baseline 10 ( totally unsatisfied on 1-10 scale   Time 12   Period Weeks   Status New   OT LONG TERM GOAL  #9   Baseline Pt able to reduce time being sedentary from 8 waking hours daily to 2 hours daily in order to increase social and community participation to improve overall life satisfaction.   Time 12   Period Weeks   Status New               Plan - 09/04/15 1457    Clinical Impression Statement Pt is managing quite well between Rx sessions and agrees w/ plan to decrease OT frequency  to 1 x week and PRN while awaiting garment fitting. Garments were measured by vendor yesterday.    Pt will benefit from skilled therapeutic intervention in order to improve on the following deficits (Retired) Abnormal gait;Decreased range of motion;Difficulty walking;Impaired flexibility;Increased edema;Decreased activity tolerance;Decreased knowledge of precautions;Decreased skin integrity;Decreased knowledge of use of DME;Pain;Decreased mobility;Impaired perceived functional ability   Rehab Potential Good   OT Frequency 1x / week   OT  Duration 12 weeks   OT Treatment/Interventions Self-care/ADL training;DME and/or AE instruction;Manual lymph drainage;Patient/family education;Compression bandaging;Therapeutic exercises;Therapeutic activities;Manual Therapy        Problem List Patient Active Problem List   Diagnosis Date Noted  . Vitamin D deficiency 08/20/2014  . Secondary hyperparathyroidism ([place]) 08/20/2014    Andrey Spearman, MS, OTR/L, [facility] 09/04/2015 2:59 PM  [place] MAIN [facility] SERVICES [address] [place], [place], [address] Phone: [phone]   Fax:  [phone]  Name: Aspin A Burns MRN: [id number] Date of Birth: 02/14/1951

## 2015-09-04 NOTE — Patient Instructions (Signed)
LE instructions and precautions as established- see initial eval.   

## 2015-09-05 ENCOUNTER — Ambulatory Visit: Payer: 59

## 2015-09-05 ENCOUNTER — Ambulatory Visit: Payer: 59 | Admitting: Occupational Therapy

## 2015-09-05 DIAGNOSIS — I890 Lymphedema, not elsewhere classified: Secondary | ICD-10-CM | POA: Diagnosis not present

## 2015-09-05 DIAGNOSIS — R2681 Unsteadiness on feet: Secondary | ICD-10-CM

## 2015-09-05 DIAGNOSIS — R531 Weakness: Secondary | ICD-10-CM

## 2015-09-05 NOTE — Therapy (Signed)
[facility] MAIN [facility] SERVICES [address] [place], [place], [address] Phone: [phone]   Fax:  [phone]  Physical Therapy Evaluation  Patient Details  Name: [name] MRN: [id number] Date of Birth: 04/13/1951 Referring Provider: Sharl Ma  Encounter Date: 09/05/2015      PT End of Session - 09/05/15 0947    Visit Number 1   Number of Visits 17   Date for PT Re-Evaluation 10/03/15   PT Start Time 0810   PT Stop Time 0930   PT Time Calculation (min) 80 min   Equipment Utilized During Treatment Gait belt   Activity Tolerance Patient tolerated treatment well   Behavior During Therapy [facility] for tasks assessed/performed      Past Medical History  Diagnosis Date  . Peptic ulcer     Past Surgical History  Procedure Laterality Date  . Mini gastric bypass    . Cholecystectomy    . Breast surgery    . Knee surgery    . Shoulder surgery      There were no vitals filed for this visit.  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 She reports due to that she has dizziness and falls. pt reports she falls every couple months, but she has near falls daily. she reports having balance therapy before, but no strength training. she reports her R leg feels particularly weak, but feels weak all over. she has been seen recently for lymphedema treatment for both legs. pt had a most fall 07/23/15 where she again fractured her wrist, and fractured T12-L2 and L4. pt had a vertebroplasty on T12 and L4 and without complication. she reports she is here for an osteoperosis program.    Currently in Pain? No/denies             [facility] PT Assessment - 09/05/15 9518    Assessment   Medical Diagnosis Osteoperosis   Referring Provider Sharl Ma   Onset Date/Surgical Date 08/28/15   Prior Therapy OT   Precautions   Precautions Fall;Back   Precaution Comments --  no lifting more than 8lbs   Balance Screen   Has the patient fallen in the past 6 months Yes   How many times? 1   Has the patient had a decrease in activity level because of a fear of falling?  Yes   Is the patient reluctant to leave their home because of a fear of falling?  Yes   Home Environment   Living Environment Private residence   Living Arrangements Spouse/significant other   Type of Home House   Home Access Stairs to enter   Entrance Stairs-Number of Steps 3   Entrance Stairs-Rails None   Home Layout Two level   Home Equipment [place] - single point   Prior Function   Level of Independence Independent;Independent with basic ADLs;Independent with household mobility without device;Independent with transfers;Independent with gait  was able to go up/down home steps independently   Vocation Retired   Leisure walking farther, cleaning home, cleaning, and ADLs independently   Standardized Balance Assessment   Standardized Balance Assessment Hospital doctor   [employer]  Test   Sit to Stand Able to stand  independently using hands   Standing Unsupported Able to stand safely 2 minutes   Sitting with Back Unsupported but Feet Supported on Floor or Stool Able to sit safely and securely 2 minutes   Stand to Sit Sits safely with minimal use of hands   Transfers Able to transfer safely, definite need of hands   Standing Unsupported with Eyes Closed Able to stand 10 seconds with supervision   Standing Ubsupported with Feet Together Able to place feet together independently and stand for 1 minute with supervision   From Standing, Reach Forward with Outstretched Arm Reaches forward but needs supervision   From Standing Position, Pick up Object from Floor  Unable to pick up shoe, but reaches 2-5 cm (1-2") from shoe and balances independently   From Standing Position, Turn to Look Behind Over each Shoulder Looks behind from both sides and weight shifts well   Turn 360 Degrees Able to turn 360 degrees safely but slowly   Standing Unsupported, Alternately Place Feet on Step/Stool Able to complete 4 steps without aid or supervision   Standing Unsupported, One Foot in Front Able to take small step independently and hold 30 seconds   Standing on One Leg Tries to lift leg/unable to hold 3 seconds but remains standing independently   Total Score 38        POSTURE/OBSERVATION: mod kyphosis. LE edema 3+. Cast on L forearm   PROM/AROM: WNL  STRENGTH:  Graded on a 0-5 scale Muscle Group Left Right  Shoulder flex 4- 4  Shoulder Abd 4 4  Shoulder Ext    Shoulder IR/ER    Elbow 4 4  Wrist/hand   4  Hip Flex 3- 3-  Hip Abd 3 3-  Hip Add 2 2  Hip Ext 2 2  Hip IR/ER    Knee Flex 4 4  Knee Ext 3+ 3+  Ankle DF 4- 4-  Ankle PF 3 3   SENSATION: WNL   SPECIAL TESTS:   FUNCTIONAL MOBILITY: pt is independent with bed mobilty and transfers with increased time    BALANCE:poor see BERG   GAIT: pt walks with arms in high guard, wide BOS, short step length with SPC  OUTCOME MEASURES: TEST Outcome Interpretation  5 times sit<>stand 31.5 sec >60 yo, >15 sec indicates increased risk for falls  10 meter walk test          0.66       m/s <1.0 m/s indicates increased risk for falls; limited community ambulator  Timed up and Go        22.6         sec <14 sec indicates increased risk for falls  6 minute walk test                Feet 1000 feet is community Forensic scientist Assessment 38/56 <36/56 (100% risk for falls), 37-45 (80% risk for falls); 46-51 (>50% risk for falls); 52-55 (lower risk <25% of falls)                         PT Education - 09/05/15 0947    Education provided Yes   Education Details plan or care for  PT. outcome measur efindings   Person(s) Educated Patient   Methods Explanation   Comprehension Verbalized understanding             PT Long Term Goals -  09/05/15 0952    PT LONG TERM GOAL #1   Title pt will improve berg balance score to >46/56 to reduce fall risk   Baseline 38/56   Time 8   Period Weeks   Status New   PT LONG TERM GOAL #2   Title pt will imporve 86m walk speed to 1.3m/s for community mobility    Time 8   Period Weeks   Status New   PT LONG TERM GOAL #3   Title pt will improve 5x sit to stand to under 25s showimg improved leg strength.    Time 4   Period Weeks   Status New   PT LONG TERM GOAL #4   Title pt will redcue TUG tim to <12s reducing fall risk   Time 8   Period Weeks   Status New               Plan - 09/05/15 0948    Clinical Impression Statement pt presents with history of osteoperosis, history of vertigo, history of falls with multiple fx, most recently L wrist and T12-L4 with kyphoplasty performed 08/28/15. pt demonstrates signficant LE weakness, impaired gait, imbalance with a high risk of falls. pt is still undergoing lymphedema therapy for bilateral LES, but has much improved. pt would benefit from skilled PT services to improve LE strength, postural strength, core strength, balance, and gait to reduce fall risk and maximize mobility.    Pt will benefit from skilled therapeutic intervention in order to improve on the following deficits Decreased activity tolerance;Decreased balance;Decreased mobility;Dizziness;Difficulty walking;Decreased strength;Increased edema;Pain   Rehab Potential Good   PT Frequency 2x / week   PT Duration 8 weeks   PT Treatment/Interventions Aquatic Therapy;Neuromuscular re-education;Balance training;Therapeutic exercise;Therapeutic activities;Functional mobility training;Gait training;Stair training;Vestibular         Problem List Patient Active Problem List   Diagnosis Date Noted  . Vitamin D  deficiency 08/20/2014  . Secondary hyperparathyroidism (HCC) 08/20/2014   Carlyon Shadow. Horice Carrero, PT, DPT [phone]  Gerald Honea 09/05/2015, 9:58 AM  [facility] MAIN [facility] SERVICES [address] [place], [place], [address] Phone: [phone]   Fax:  [phone]  Name: Kyla A Burns MRN: [id number] Date of Birth: 07/10/1951

## 2015-09-05 NOTE — Patient Instructions (Signed)
[email or web address] Bridges x10 SLR x10 SKTC x 20 Hip abd in standing 2x10 Seated march 2x10  LAQ 2x10 VOR x 1 30s x 4 VORx2 30s x 4 (in sitting)

## 2015-09-05 NOTE — Therapy (Signed)
[place] MAIN [facility] SERVICES [address] [place], [place], [address] Phone: [phone]   Fax:  [phone]  Occupational Therapy Treatment  Patient Details  Name: [name] MRN: [id number] Date of Birth: Dec 12, 1950 No Data Recorded  Encounter Date: 09/05/2015      OT End of Session - 09/05/15 1004    Visit Number 25   Number of Visits 36   Date for OT Re-Evaluation 08/19/15   OT Start Time 0929   OT Stop Time 1001   OT Time Calculation (min) 32 min   Behavior During Therapy [facility] for tasks assessed/performed      Past Medical History  Diagnosis Date  . Peptic ulcer     Past Surgical History  Procedure Laterality Date  . Mini gastric bypass    . Cholecystectomy    . Breast surgery    . Knee surgery    . Shoulder surgery      There were no vitals filed for this visit.  Visit Diagnosis:  Lymphedema      Subjective Assessment - 09/05/15 1002    Subjective  Pt presents for visit 25 for Intensive Phase CDT to BLE . Pt visit abbreviated today due to earlier PT eval appointmrent running late.Pt has no new complaints.   Patient is accompained by: Family member   Pertinent History osteporosis; s/p gastric bypass 2003 ( s/p ~250# wt loss);  strong + family hx for BLE lymphedema- mother and maternal grandmother; BLE swelling onset early adulthood; frequent falls ( 3-4 in last month with frequent loss of balance   Limitations difficlty walking, standing, lower body dressing , fitting LB clothing and street shoes, transfers, bed moility, bathing, performing hygine, falls, limits participation in home management and productive work/ volunterr activities, limits ability to participate in  sociall family and travel activivities   Patient Stated Goals improve unctional ambulation, ability to transfer and move around in bed, and increase activity level w/ less sedentary hours ( Current Level of satiscation with performance of all  activities rated as 10 on 1-10 scale w/ 1 most satified and 10 least satisfied)most dissatisfied) on 1-10 scall                      OT Treatments/Exercises (OP) - 09/05/15 0001    Manual Therapy   Manual Therapy Edema management;Manual Lymphatic Drainage (MLD);Compression Bandaging   Manual therapy comments skin care w/ low pH castor oil [facility]) during MLD   Manual Lymphatic Drainage (MLD) Manual lymph drainage (MLD) in supine utilizing functional inguinal lymph nodes and deep abdominal lymphatics as is customary for non-cancer related lower extremity LE, including bilateral "short neck" sequence, J strokes to sub and supraclavicular LN, deep abdominal pathways, functional inguinal LN, lower extremity proximal to distal w/ emphasis on medial knee bottleneck and politeal LN. Performed fibrosis technique to B maleoli and distal  legs to address fatty fibrosis. Good tolerance.   Compression Bandaging Added bumpy comprex foam pades to medial and lateral mallioli on L foot today in effort to breakdown fatty fibrosis at ankle. LLE gradient compression wraps applied circumferentially in gradient configuration from toes to groin as follows: toe wrap x1 under cotton stockinett; 8 cm x 1 to foot and ankle, 10 cm x 2, then  12 cm x 2- all layered over .04 x 10 cm and 12 cm Rosidol Soft foam from A-G.  OT Education - 09/05/15 1004    Education provided No   Education Details no edu today 2/2 time constraints             OT Long Term Goals - 06/14/15 1415    OT LONG TERM GOAL #1   Title Pt able to correctly apply gradient compression wraps from toes to groiin with Max assistance from caregiver within 2 weeks for optimal limb volume reduction and LE self management over time.- Pt is independent w/ compression wraps. No CG assistance required.   Baseline dependent   Time 2   Period Weeks   Status Achieved   OT LONG TERM GOAL #2   Title Pt to achieve 20%  limb volume reductions bilaterally by DC to limit lymphedema (LE) progressio, to limit infection risk, to decrease falls risk, and to improve functional mobility and ambulation.-Partially met on LLE w/ 13.67% limb volume reduction measured today.   Baseline dependent   Time 12   Period Weeks   Status Partially Met   OT LONG TERM GOAL #3   Title Pt mod assist and at least 85% compliant with all LE self-care protocols, including simple self-manual lymphatic drainage (MLD), skin care, lymphatic pumping ther ex, and donning/ doffing progression garments.- Pt still learning protocols but shows excellent progress with skin care, wraps, simple self MLD, and ther ex.   Baseline dependent   Time 12   Period Weeks   Status Partially Met   OT LONG TERM GOAL #4   Title Pt to remain infection free throughout CDT course to limit infection and LE progression.   Baseline dependent   Time 12   Status Partially Met   OT LONG TERM GOAL #5   Title During Management Phase CDT Pt to sustain limb volume reductions achieved during Intensive Phase CDT within 5% utilizing LE self-care protocols, appropriate compression garments/ devices, and moderate caregiver assistance.   Baseline dependent   Time 6   Period Months   Status New   OT LONG TERM GOAL #6   Title Pt satisfaction with ability to perform safe functional ambulation to ienable her to complete food shopping activities to mprove from  level 10 ( totally unsatisfied)  to level 7 on a 1-10 scale with 1 being totally satisfied.   Baseline 10 ( totally unsatisfied on 1-10 scale   Time 12   Period Weeks   Status New   OT LONG TERM GOAL #7   Title Pt satisfaction with ability to perform safe functional mobility for car transfers, STS transfers, befd mobility, ytub/shower transfers)  to [facility] from  level 10 ( totally unsatisfied)  to level 7 on a 1-10 scale with 1 being totally satisfied.   Baseline 10 ( totally unsatisfied on 1-10 scale   Time 12   Period  Weeks   Status New   OT LONG TERM GOAL #8   Title Pt satisfaction with current activity level needed  to participate in socail and community activities, and to complete ADLs, leisure and productive activities without excessive fatigue to mprove from  level 10 ( totally unsatisfied)  to level 7 on a 1-10 scale with 1 being totally satisfied.   Baseline 10 ( totally unsatisfied on 1-10 scale   Time 12   Period Weeks   Status New   OT LONG TERM GOAL  #9   Baseline Pt able to reduce time being sedentary from 8 waking hours daily to 2 hours daily in order to increase social  and community participation to improve overall life satisfaction.   Time 12   Period Weeks   Status New               Plan - 09/05/15 1005    Clinical Impression Statement Completed manual therapy today and discussed plan to order knee length vs full leg HOS device. Pt aggrees she may be more compliant w/ devicew that is less obtrusiive. Next visit is last scheduled. We'll meet again to addedd new garments and devices once delivered and fit.   Pt will benefit from skilled therapeutic intervention in order to improve on the following deficits (Retired) Abnormal gait;Decreased range of motion;Difficulty walking;Impaired flexibility;Increased edema;Decreased activity tolerance;Decreased knowledge of precautions;Decreased skin integrity;Decreased knowledge of use of DME;Pain;Decreased mobility;Impaired perceived functional ability   OT Frequency 1x / week   OT Treatment/Interventions Self-care/ADL training;DME and/or AE instruction;Manual lymph drainage;Patient/family education;Compression bandaging;Therapeutic exercises;Therapeutic activities;Manual Therapy   Consulted and Agree with Plan of Care Patient;Family member/caregiver        Problem List Patient Active Problem List   Diagnosis Date Noted  . Vitamin D deficiency 08/20/2014  . Secondary hyperparathyroidism ([place]) 08/20/2014    Andrey Spearman, MS, OTR/L,  [facility] 09/05/2015 10:08 AM   [place] MAIN [facility] SERVICES [address] [place], [place], [address] Phone: [phone]   Fax:  [phone]  Name: Makiyah A Burns MRN: [id number] Date of Birth: 1950-09-16

## 2015-09-05 NOTE — Patient Instructions (Signed)
LE instructions and precautions as established- see initial eval.   

## 2015-09-10 ENCOUNTER — Ambulatory Visit: Payer: 59 | Admitting: Occupational Therapy

## 2015-09-10 ENCOUNTER — Ambulatory Visit: Payer: 59

## 2015-09-10 DIAGNOSIS — I890 Lymphedema, not elsewhere classified: Secondary | ICD-10-CM | POA: Diagnosis not present

## 2015-09-10 DIAGNOSIS — R531 Weakness: Secondary | ICD-10-CM

## 2015-09-10 DIAGNOSIS — R2681 Unsteadiness on feet: Secondary | ICD-10-CM

## 2015-09-10 NOTE — Therapy (Addendum)
[place] MAIN [facility] SERVICES [address] [place], [place], [address] Phone: [phone]   Fax:  [phone]  Physical Therapy Treatment  Patient Details  Name: [name] MRN: [id number] Date of Birth: 14-Mar-1951 Referring Provider: Buddy Duty  Encounter Date: 09/10/2015      PT End of Session - 09/10/15 0857    Visit Number 2   Number of Visits 17   Date for PT Re-Evaluation 10/03/15   PT Start Time 0800   PT Stop Time 0856   PT Time Calculation (min) 56 min   Equipment Utilized During Treatment Gait belt   Activity Tolerance Patient tolerated treatment well   Behavior During Therapy [facility] for tasks assessed/performed      Past Medical History  Diagnosis Date  . Peptic ulcer     Past Surgical History  Procedure Laterality Date  . Mini gastric bypass    . Cholecystectomy    . Breast surgery    . Knee surgery    . Shoulder surgery      There were no vitals filed for this visit.  Visit Diagnosis:  Weakness  Unsteadiness on feet      Subjective Assessment - 09/10/15 0810    Subjective pt reports she did the "easier" of the HEP more so than the harder ones (SLR and bridges). pt has a new cast on her L wrist to wear for 2 more weeks as her fx was healed yet.    Currently in Pain? Yes   Pain Score 7    Pain Location --  back      Therex Nustep: L1 x 2 min no charge warm up   Mini squat 2x10 Heel raise  /toe raise 2x10  Hip abduction in standing 2x10 Standing hip extension 2x10 Side stepping in // bars x 5 laps, light Ue support Scapular retraction 2x10  Diaphragmatic breathing (initiating TA contraction) education x4 min Diaphragmatic breathing (initiating TA contraction) with alt SKTC Diaphragmatic breathing (initiating TA contraction) with alt LE march in supine x10 Diaphragmatic breathing (initiating TA contraction) with mini bridge: cues to keep neutral pelvis  sidelying clamshell with Diaphragmatic breathing  (initiating TA contraction) 2x10 each side Sitting LAQ with ball squeeze 2x10  sit to stand with Diaphragmatic breathing (initiating TA contraction) x 5    Pt requires min verbal and tactile cues for proper exercise performance                       PT Education - 09/10/15 0857    Education provided Yes   Education Details diaphragmatic breathing   Person(s) Educated Patient   Methods Explanation   Comprehension Verbalized understanding             PT Long Term Goals - 09/05/15 0952    PT LONG TERM GOAL #1   Title pt will improve berg balance score to >46/56 to reduce fall risk   Baseline 38/56   Time 8   Period Weeks   Status New   PT LONG TERM GOAL #2   Title pt will imporve 64m walk speed to 1.64m/s for community mobility    Time 8   Period Weeks   Status New   PT LONG TERM GOAL #3   Title pt will improve 5x sit to stand to under 25s showimg improved leg strength.    Time 4   Period Weeks   Status New   PT LONG TERM GOAL #4  Title pt will redcue TUG tim to <12s reducing fall risk   Time 8   Period Weeks   Status New               Plan - 09/10/15 0857    Clinical Impression Statement pt did fair with therex today. she did express an increase in LBP, but was somewhat eased with diaphragmatic breathing which initiated TA contraction. pt is able to utilize verbal cues well to correct exercise    Pt will benefit from skilled therapeutic intervention in order to improve on the following deficits Decreased activity tolerance;Decreased balance;Decreased mobility;Dizziness;Difficulty walking;Decreased strength;Increased edema;Pain   Rehab Potential Good   PT Frequency 2x / week   PT Duration 8 weeks   PT Treatment/Interventions Aquatic Therapy;Neuromuscular re-education;Balance training;Therapeutic exercise;Therapeutic activities;Functional mobility training;Gait training;Stair training;Vestibular        Problem List Patient Active Problem  List   Diagnosis Date Noted  . Vitamin D deficiency 08/20/2014  . Secondary hyperparathyroidism ([place]) 08/20/2014   Gorden Harms. Henya Aguallo, PT, DPT [phone]  Asal Teas 09/10/2015, 8:59 AM  [place] MAIN [facility] SERVICES [address] [place], [place], [address] Phone: [phone]   Fax:  [phone]  Name: [name] MRN: [id number] Date of Birth: 03/31/1951

## 2015-09-10 NOTE — Patient Instructions (Signed)
LE instructions and precautions as established- see initial eval.   

## 2015-09-10 NOTE — Therapy (Signed)
[place] MAIN [facility] SERVICES [address] [place], [place], [address] Phone: [phone]   Fax:  [phone]  Occupational Therapy Treatment  Patient Details  Name: [name] MRN: [id number] Date of Birth: [birth year] No Data Recorded  Encounter Date: 09/10/2015      OT End of Session - 09/10/15 1159    Visit Number 26   Number of Visits 38   OT Start Time 0900   OT Stop Time 1000   OT Time Calculation (min) 60 min   Behavior During Therapy [facility] for tasks assessed/performed      Past Medical History  Diagnosis Date  . Peptic ulcer     Past Surgical History  Procedure Laterality Date  . Mini gastric bypass    . Cholecystectomy    . Breast surgery    . Knee surgery    . Shoulder surgery      There were no vitals filed for this visit.  Visit Diagnosis:  Lymphedema      Subjective Assessment - 09/10/15 1120    Subjective  Pt presents for visit 26 for Intensive Phase CDT to BLE . Pt completed 60 min PT session prior to our session. Pt had a couple of questions re DME vendor correspondence. We emailed vendor for clariffication re device specs.   Patient is accompained by: Family member   Pertinent History osteporosis; s/p gastric bypass 2003 ( s/p ~250# wt loss);  strong + family hx for BLE lymphedema- mother and maternal grandmother; BLE swelling onset early adulthood; frequent falls ( 3-4 in last month with frequent loss of balance   Limitations difficlty walking, standing, lower body dressing , fitting LB clothing and street shoes, transfers, bed moility, bathing, performing hygine, falls, limits participation in home management and productive work/ volunterr activities, limits ability to participate in  sociall family and travel activivities   Patient Stated Goals improve unctional ambulation, ability to transfer and move around in bed, and increase activity level w/ less sedentary hours ( Current Level of satiscation with  performance of all activities rated as 10 on 1-10 scale w/ 1 most satified and 10 least satisfied)most dissatisfied) on 1-10 scall   Currently in Pain? No/denies                              OT Education - 09/10/15 1158    Education provided Yes   Education Details Reviewed garment fitting and assessment process. Reviewed plan going forward and progress towards goals.   Person(s) Educated Patient;Spouse   Methods Explanation   Comprehension Verbalized understanding;Need further instruction             OT Long Term Goals - 06/14/15 1415    OT LONG TERM GOAL #1   Title Pt able to correctly apply gradient compression wraps from toes to groiin with Max assistance from caregiver within 2 weeks for optimal limb volume reduction and LE self management over time.- Pt is independent w/ compression wraps. No CG assistance required.   Baseline dependent   Time 2   Period Weeks   Status Achieved   OT LONG TERM GOAL #2   Title Pt to achieve 20% limb volume reductions bilaterally by DC to limit lymphedema (LE) progressio, to limit infection risk, to decrease falls risk, and to improve functional mobility and ambulation.-Partially met on LLE w/ 13.67% limb volume reduction measured today.   Baseline dependent   Time  12   Period Weeks   Status Partially Met   OT LONG TERM GOAL #3   Title Pt mod assist and at least 85% compliant with all LE self-care protocols, including simple self-manual lymphatic drainage (MLD), skin care, lymphatic pumping ther ex, and donning/ doffing progression garments.- Pt still learning protocols but shows excellent progress with skin care, wraps, simple self MLD, and ther ex.   Baseline dependent   Time 12   Period Weeks   Status Partially Met   OT LONG TERM GOAL #4   Title Pt to remain infection free throughout CDT course to limit infection and LE progression.   Baseline dependent   Time 12   Status Partially Met   OT LONG TERM GOAL #5    Title During Management Phase CDT Pt to sustain limb volume reductions achieved during Intensive Phase CDT within 5% utilizing LE self-care protocols, appropriate compression garments/ devices, and moderate caregiver assistance.   Baseline dependent   Time 6   Period Months   Status New   OT LONG TERM GOAL #6   Title Pt satisfaction with ability to perform safe functional ambulation to ienable her to complete food shopping activities to mprove from  level 10 ( totally unsatisfied)  to level 7 on a 1-10 scale with 1 being totally satisfied.   Baseline 10 ( totally unsatisfied on 1-10 scale   Time 12   Period Weeks   Status New   OT LONG TERM GOAL #7   Title Pt satisfaction with ability to perform safe functional mobility for car transfers, STS transfers, befd mobility, ytub/shower transfers)  to [facility] from  level 10 ( totally unsatisfied)  to level 7 on a 1-10 scale with 1 being totally satisfied.   Baseline 10 ( totally unsatisfied on 1-10 scale   Time 12   Period Weeks   Status New   OT LONG TERM GOAL #8   Title Pt satisfaction with current activity level needed  to participate in socail and community activities, and to complete ADLs, leisure and productive activities without excessive fatigue to mprove from  level 10 ( totally unsatisfied)  to level 7 on a 1-10 scale with 1 being totally satisfied.   Baseline 10 ( totally unsatisfied on 1-10 scale   Time 12   Period Weeks   Status New   OT LONG TERM GOAL  #9   Baseline Pt able to reduce time being sedentary from 8 waking hours daily to 2 hours daily in order to increase social and community participation to improve overall life satisfaction.   Time 12   Period Weeks   Status New               Plan - 09/10/15 1200    Clinical Impression Statement Pt entering Management Phase of CDT today. She continues to make excellent progress towards all goals, she has learned all LE self care protocols, and currently awaiting fitting of  BLE custom daytime and HOS compression garments/ devices. Area needing improvement includes compliance w/ wraps.     Pt will benefit from skilled therapeutic intervention in order to improve on the following deficits (Retired) Abnormal gait;Decreased range of motion;Difficulty walking;Impaired flexibility;Increased edema;Decreased activity tolerance;Decreased knowledge of precautions;Decreased skin integrity;Decreased knowledge of use of DME;Pain;Decreased mobility;Impaired perceived functional ability   Rehab Potential Good   OT Frequency Other (comment)  Pt to return for F/U once garments are fit for assessment and troubleshooting PRN.   OT Treatment/Interventions Self-care/ADL training;DME and/or  AE instruction;Manual lymph drainage;Patient/family education;Compression bandaging;Therapeutic exercises;Therapeutic activities;Manual Therapy   Consulted and Agree with Plan of Care Patient;Family member/caregiver        Problem List Patient Active Problem List   Diagnosis Date Noted  . Vitamin D deficiency 08/20/2014  . Secondary hyperparathyroidism ([place]) 08/20/2014    Andrey Spearman, MS, OTR/L, [facility] 09/10/2015 12:04 PM   [place] MAIN [facility] SERVICES [address] [place], [place], [address] Phone: [phone]   Fax:  [phone]  Name: [name] MRN: [id number] Date of Birth: 23-Nov-1950

## 2015-09-11 ENCOUNTER — Ambulatory Visit: Payer: 59 | Admitting: Occupational Therapy

## 2015-09-11 ENCOUNTER — Ambulatory Visit: Payer: 59

## 2015-09-13 ENCOUNTER — Encounter: Payer: 59 | Admitting: Occupational Therapy

## 2015-09-18 ENCOUNTER — Ambulatory Visit: Payer: 59 | Attending: Family Medicine

## 2015-09-18 DIAGNOSIS — M25632 Stiffness of left wrist, not elsewhere classified: Secondary | ICD-10-CM | POA: Insufficient documentation

## 2015-09-18 DIAGNOSIS — M25642 Stiffness of left hand, not elsewhere classified: Secondary | ICD-10-CM | POA: Insufficient documentation

## 2015-09-18 DIAGNOSIS — M6281 Muscle weakness (generalized): Secondary | ICD-10-CM | POA: Diagnosis present

## 2015-09-18 DIAGNOSIS — R2681 Unsteadiness on feet: Secondary | ICD-10-CM

## 2015-09-18 DIAGNOSIS — M79632 Pain in left forearm: Secondary | ICD-10-CM | POA: Diagnosis present

## 2015-09-18 DIAGNOSIS — R531 Weakness: Secondary | ICD-10-CM | POA: Diagnosis present

## 2015-09-18 NOTE — Therapy (Signed)
[place] [facility] MAIN [facility] SERVICES [address] [place], [place], [address] Phone: [phone]   Fax:  [phone]  Physical Therapy Treatment  Patient Details  Name: [name] MRN: [id number] Date of Birth: 06-30-1951 Referring Provider: Sharl Ma  Encounter Date: 09/18/2015      PT End of Session - 09/18/15 1019    Visit Number 3   Number of Visits 17   Date for PT Re-Evaluation 10/03/15   PT Start Time 0916   PT Stop Time 1015   PT Time Calculation (min) 59 min   Equipment Utilized During Treatment Gait belt   Activity Tolerance Patient tolerated treatment well   Behavior During Therapy [facility] for tasks assessed/performed      Past Medical History  Diagnosis Date  . Peptic ulcer     Past Surgical History  Procedure Laterality Date  . Mini gastric bypass    . Cholecystectomy    . Breast surgery    . Knee surgery    . Shoulder surgery      There were no vitals filed for this visit.  Visit Diagnosis:  Weakness  Unsteadiness on feet      Subjective Assessment - 09/18/15 0922    Subjective pt reports she was a little sore after last session, but not too bad. she reports she hasnt had any more falls    Currently in Pain? No/denies      Nustep Lx3 x 5 min no charge warm up 75lbs 3x15 cues for Diaphragmatic breathing (initiating TA contraction)  Heel raises on leg press 75lbs 3x10 Fwd step up: 6in single hand rail x 10 each leg scap retraction x 15 Low row yellow band 2x10  Shoulder horiz. Abduction yellow band 2x10 Toe taps on AIREX 6inch step no UE 2x10  Fwd step up from AIREX onto step no UE x  10 each leg Supine serratus punch 15x 2 Standing wide BOS EO/EC 15s x 6 Standing NBOS EO./EC 15s x 3 Weight shift AP on floor then progressed to AIREX x 20  Circular weight shift x 10 each way  Bridges with Diaphragmatic breathing (initiating TA contraction) 2x10 17foot on AIREX, 1 on step with overhead and side to side ball lift  x 10 each    Pt requires min verbal and tactile cues for proper exercise performance    pt requires CGA to min A for safety on balance exercises                                PT Long Term Goals - 09/05/15 5956    PT LONG TERM GOAL #1   Title pt will improve berg balance score to >46/56 to reduce fall risk   Baseline 38/56   Time 8   Period Weeks   Status New   PT LONG TERM GOAL #2   Title pt will imporve 9m walk speed to 1.40m/s for community mobility    Time 8   Period Weeks   Status New   PT LONG TERM GOAL #3   Title pt will improve 5x sit to stand to under 25s showimg improved leg strength.    Time 4   Period Weeks   Status New   PT LONG TERM GOAL #4   Title pt will redcue TUG tim to <12s reducing fall risk   Time 8   Period Weeks   Status New  Plan - 09/18/15 1019    Clinical Impression Statement pt did well with progression of therex today. she has quite a bit of trouble standing on compliant surfaces and when her eyes are closed. progressed leg, arm, periscapular strengthening today with some fatigue, but no other issue.    Pt will benefit from skilled therapeutic intervention in order to improve on the following deficits Decreased activity tolerance;Decreased balance;Decreased mobility;Dizziness;Difficulty walking;Decreased strength;Increased edema;Pain   Rehab Potential Good   PT Frequency 2x / week   PT Duration 8 weeks   PT Treatment/Interventions Aquatic Therapy;Neuromuscular re-education;Balance training;Therapeutic exercise;Therapeutic activities;Functional mobility training;Gait training;Stair training;Vestibular        Problem List Patient Active Problem List   Diagnosis Date Noted  . Vitamin D deficiency 08/20/2014  . Secondary hyperparathyroidism (HCC) 08/20/2014   Carlyon Shadow. Skilar Marcou, PT, DPT [phone]   Nykerria Macconnell 09/18/2015, 10:21 AM  [place] [facility] MAIN [facility]  SERVICES [address] [place], [place], [address] Phone: [phone]   Fax:  [phone]  Name: [name] MRN: [id number] Date of Birth: 1950/12/27

## 2015-09-24 ENCOUNTER — Ambulatory Visit: Payer: 59

## 2015-09-26 ENCOUNTER — Ambulatory Visit: Payer: 59

## 2015-09-26 DIAGNOSIS — R2681 Unsteadiness on feet: Secondary | ICD-10-CM

## 2015-09-26 DIAGNOSIS — R531 Weakness: Secondary | ICD-10-CM | POA: Diagnosis not present

## 2015-09-26 NOTE — Therapy (Signed)
[name] [address] [place], [place], [address] Phone: [phone]   Fax:  [phone]  Physical Therapy Treatment  Patient Details  Name: [name] MRN: [id number] Date of Birth: 10-20-1950 Referring Provider: Buddy Duty  Encounter Date: 09/26/2015      PT End of Session - 09/26/15 1034    Visit Number 4   Number of Visits 17   Date for PT Re-Evaluation 10/03/15   PT Start Time 0820   PT Stop Time 0915   PT Time Calculation (min) 55 min   Equipment Utilized During Treatment Gait belt   Activity Tolerance Patient tolerated treatment well   Behavior During Therapy [facility] for tasks assessed/performed      Past Medical History  Diagnosis Date  . Peptic ulcer     Past Surgical History  Procedure Laterality Date  . Mini gastric bypass    . Cholecystectomy    . Breast surgery    . Knee surgery    . Shoulder surgery      There were no vitals filed for this visit.  Visit Diagnosis:  Unsteadiness on feet  Weakness      Subjective Assessment - 09/26/15 0931    Subjective pt reports mild soreness the day after last session. pt reports an increase in dizziness over last few days with unknown origin. pt says she has been complient with HEP and will continue to do so.   Patient is accompained by: Family member   Patient Stated Goals get stronger, reudce fall risk   Currently in Pain? No/denies   Pain Score 0-No pain   Multiple Pain Sites No          Neuro re ed:  Lateral/AP weight shift in // bars on airex eyes closed with no UE 10 x 2 Staggered stance AP weight shift in // bars on airex with eyes closed with no UE 10 x 2 Pt requires moderate verbal cues for weight shifting and upward gaze  pt requires CGA for safety on balance exercises   Therapeutic ex:  Mini squats in // bars 10 x 2: pt required cues for proper squat technique particularly reducing forward lean   Heel raises in // bars 10 x 2 Low  rows with yellow theraband 10 x 2 Horizontal abduction with yellow theraband 10 x 2 Diaphragmatic breathing with marches 10 each leg x 2 Diaphragmatic breathing with bridges 10 x 2 D2 UE pattern with ball 10 x 2  Pt requires min-mod verbal and tactile cues for proper exercise performance           PT Education - 09/26/15 0947    Education provided Yes   Education Details pt instructed in diaphragmatic breathing and core activation   Person(s) Educated Patient   Methods Explanation;Tactile cues;Verbal cues   Comprehension Verbalized understanding             PT Long Term Goals - 09/05/15 0952    PT LONG TERM GOAL #1   Title pt will improve berg balance score to >46/56 to reduce fall risk   Baseline 38/56   Time 8   Period Weeks   Status New   PT LONG TERM GOAL #2   Title pt will imporve 79m walk speed to 1.23m/s for community mobility    Time 8   Period Weeks   Status New   PT LONG TERM GOAL #3   Title pt will improve 5x sit to stand to  under 25s showimg improved leg strength.    Time 4   Period Weeks   Status New   PT LONG TERM GOAL #4   Title pt will redcue TUG tim to <12s reducing fall risk   Time 8   Period Weeks   Status New               Plan - 09/26/15 1014    Clinical Impression Statement pt required cues and contact guard assistance in balance activites with a slight increase in unsteadiness.  pt did well with therapeutic exercises and showed an increase in endurance. pt is progressing well in activities and will benefit from continued threapy. pt did report R sided lower back discomfort with D2 UE pattern, SPT modified, instructing patient to stay in painfree range.   Pt will benefit from skilled therapeutic intervention in order to improve on the following deficits Decreased activity tolerance;Decreased balance;Decreased mobility;Dizziness;Difficulty walking;Decreased strength;Increased edema;Pain   Rehab Potential Good   PT Frequency 2x / week    PT Duration 8 weeks   PT Treatment/Interventions Aquatic Therapy;Neuromuscular re-education;Balance training;Therapeutic exercise;Therapeutic activities;Functional mobility training;Gait training;Stair training;Vestibular        Problem List Patient Active Problem List   Diagnosis Date Noted  . Vitamin D deficiency 08/20/2014  . Secondary hyperparathyroidism ([place]) 08/20/2014   [name], SPT   This entire session was performed under direct supervision and direction of a licensed therapist/therapist assistant . I have personally read, edited and approve of the note as written. Tiffany Harms. Burns, PT, DPT [phone]   TiffanyAshley 09/26/2015, 10:39 AM  [place] MAIN [facility] Burns [address] [place], [place], [address] Phone: [phone]   Fax:  [phone]  Name: [name] MRN: [id number] Date of Birth: Dec 14, 1950

## 2015-10-01 ENCOUNTER — Ambulatory Visit: Payer: 59

## 2015-10-01 DIAGNOSIS — R2681 Unsteadiness on feet: Secondary | ICD-10-CM

## 2015-10-01 DIAGNOSIS — R531 Weakness: Secondary | ICD-10-CM

## 2015-10-01 NOTE — Therapy (Signed)
[place] [facility] MAIN [facility] SERVICES [address] [place], [place], [address] Phone: [phone]   Fax:  [phone]  Physical Therapy Treatment  Patient Details  Name: [name] MRN: [id number] Date of Birth: 10-31-1950 Referring Provider: Sharl Ma  Encounter Date: 10/01/2015      PT End of Session - 10/01/15 1240    Visit Number 5   Number of Visits 17   Date for PT Re-Evaluation 10/03/15   PT Start Time 1205   PT Stop Time 1255   PT Time Calculation (min) 50 min   Equipment Utilized During Treatment Gait belt   Activity Tolerance Patient tolerated treatment well   Behavior During Therapy [facility] for tasks assessed/performed      Past Medical History  Diagnosis Date  . Peptic ulcer     Past Surgical History  Procedure Laterality Date  . Mini gastric bypass    . Cholecystectomy    . Breast surgery    . Knee surgery    . Shoulder surgery      There were no vitals filed for this visit.  Visit Diagnosis:  Unsteadiness on feet  Weakness      Subjective Assessment - 10/01/15 1219    Subjective Pt with noticable soreness for a few days after doing housework but no pain. She reports feeling dizzy upon turning head side to side, making her feel unsteady. Pt reporting that she took a few days off from HEP due to soreness from housework. No specific questions or concerns at this time.    Patient is accompained by: Family member   Patient Stated Goals get stronger, reduce fall risk   Currently in Pain? No/denies         Neuro re ed: AP weight shift on airex in staggard stance 30s x 3 Toe tap on airex 10 each leg x 3 Touching ball to bars in // bars with visual tracking on airex 10 x 3 Inferior to superior ball with visual tracking on airex 10 x 3  Therapeutic exercise: Lateral band walks yellow band 3 laps Standing hip flex, abd, ex with yellow band 10 x 2 sets Sit to stand 8 x 3 Leg press  90 lbs 2 x 10;  Pt required min to  mod cues on all activities            PT Long Term Goals - 09/05/15 2353    PT LONG TERM GOAL #1   Title pt will improve berg balance score to >46/56 to reduce fall risk   Baseline 38/56   Time 8   Period Weeks   Status New   PT LONG TERM GOAL #2   Title pt will imporve 94m walk speed to 1.30m/s for community mobility    Time 8   Period Weeks   Status New   PT LONG TERM GOAL #3   Title pt will improve 5x sit to stand to under 25s showimg improved leg strength.    Time 4   Period Weeks   Status New   PT LONG TERM GOAL #4   Title pt will redcue TUG tim to <12s reducing fall risk   Time 8   Period Weeks   Status New               Plan - 10/01/15 1241    Clinical Impression Statement Pt experienced some mild R LBP that resolved with modification of exercise. She continues to struggle with balance with head  turning exercises. Pt is progressing toward goals and will benefit from continued therapy.   Pt will benefit from skilled therapeutic intervention in order to improve on the following deficits Decreased activity tolerance;Decreased balance;Decreased mobility;Dizziness;Difficulty walking;Decreased strength;Increased edema;Pain   Rehab Potential Good   PT Frequency 2x / week   PT Duration 8 weeks   PT Treatment/Interventions Aquatic Therapy;Neuromuscular re-education;Balance training;Therapeutic exercise;Therapeutic activities;Functional mobility training;Gait training;Stair training;Vestibular   PT Next Visit Plan Repeat outcome measures, recert vs discharge. Progress strengthening and balance. Incorporate head turning activities into balance training given canal dehiscence surgery.    PT Home Exercise Plan As prescribed        Problem List Patient Active Problem List   Diagnosis Date Noted  . Vitamin D deficiency 08/20/2014  . Secondary hyperparathyroidism (HCC) 08/20/2014    Maureen Delatte SPT  This entire session was performed under direct supervision and  direction of a licensed Estate agent . I have personally read, edited and approve of the note as written. This patient note, response to treatment and overall treatment plan has been reviewed and this clinician agrees with the information provided.  Lynnea Maizes PT, DPT 10/02/2015, 10:38 AM [phone]   [facility] MAIN [facility] SERVICES [address] [place], [place], [address] Phone: [phone]   Fax:  [phone]  Name: [name] MRN: [id number] Date of Birth: 1951-01-12

## 2015-10-03 ENCOUNTER — Ambulatory Visit: Payer: 59

## 2015-10-03 ENCOUNTER — Ambulatory Visit: Payer: 59 | Admitting: Occupational Therapy

## 2015-10-03 DIAGNOSIS — M25632 Stiffness of left wrist, not elsewhere classified: Secondary | ICD-10-CM

## 2015-10-03 DIAGNOSIS — R531 Weakness: Secondary | ICD-10-CM

## 2015-10-03 DIAGNOSIS — R2681 Unsteadiness on feet: Secondary | ICD-10-CM

## 2015-10-03 DIAGNOSIS — M79632 Pain in left forearm: Secondary | ICD-10-CM

## 2015-10-03 DIAGNOSIS — M6281 Muscle weakness (generalized): Secondary | ICD-10-CM

## 2015-10-03 DIAGNOSIS — M25642 Stiffness of left hand, not elsewhere classified: Secondary | ICD-10-CM

## 2015-10-03 NOTE — Therapy (Signed)
[place] [facility] [address], [place], [address] Phone: [phone]   Fax:  [phone]  Occupational Therapy Treatment  Patient Details  Name: [name] MRN: [id number] Date of Birth: Oct 04, 1950 Referring Provider: Para March  Encounter Date: 10/03/2015      OT End of Session - 10/03/15 1426    Visit Number 1   Number of Visits 8   Date for OT Re-Evaluation 10/31/15   OT Start Time 1301   OT Stop Time 1353   OT Time Calculation (min) 52 min   Activity Tolerance Patient tolerated treatment well   Behavior During Therapy [facility] for tasks assessed/performed      Past Medical History  Diagnosis Date  . Peptic ulcer     Past Surgical History  Procedure Laterality Date  . Mini gastric bypass    . Cholecystectomy    . Breast surgery    . Knee surgery    . Shoulder surgery      There were no vitals filed for this visit.  Visit Diagnosis:  Stiffness of left wrist joint - Plan: Ot plan of care cert/re-cert  Muscle weakness - Plan: Ot plan of care cert/re-cert  Stiffness of finger joint of left hand - Plan: Ot plan of care cert/re-cert  Pain of left forearm - Plan: Ot plan of care cert/re-cert      Subjective Assessment - 10/03/15 1415    Subjective  [place] casted but DR Para March re done the cast 2 x since then -was casted for 8 wks by him was fitted with wrist splint about week ago - on the 10th Jan - refer to therapy for ROM /strength  - have hard time  gripping , holding , cooking,  volunteering at [employer] and communtiy activities,    Patient Stated Goals Want to make fist again , and bend my wrist like before - want to do bathing and dressin easier, hold or lift object , cutting with knife, and cooking/house work    Currently in Pain? No/denies            [facility] OT Assessment - 10/03/15 0001    Assessment   Diagnosis L distal radius fracture(colles fx)   Referring  Provider Para March   Onset Date 07/23/15   Assessment Pt present this date about 8 wks in cast and week out of cast since fracture - refer to  OT for ROM and strength - she returning to MD next week -     Prior Therapy Pt fracture same wrist about 4 yrs ago    Precautions   Precautions Fall   Required Braces or Orthoses Other Brace/Splint   Other Brace/Splint Wrist splnt when using hand or when up and moving    Home  Environment   Lives With Spouse   Prior Function   Level of Independence Independent   Vocation Retired   Leisure Pt is R hand dominant , likes to travel, Training and development officer, and Psychologist, occupational at [employer] and  community activities for kids    AROM   Right Forearm Supination 90 Degrees   Left Forearm Supination 85 Degrees   Right Wrist Extension 50 Degrees   Right Wrist Flexion 85 Degrees   Right Wrist Radial Deviation 16 Degrees   Right Wrist Ulnar Deviation 32 Degrees   Left Wrist Extension 40 Degrees   Left Wrist Flexion 32 Degrees   Left Wrist Radial Deviation 10 Degrees  Left Wrist Ulnar Deviation 30 Degrees   Left Hand AROM   L Thumb Opposition to Index --  Opposition to side of 4th    L Index  MCP 0-90 75 Degrees   L Index PIP 0-100 90 Degrees   L Long  MCP 0-90 80 Degrees   L Long PIP 0-100 90 Degrees   L Ring  MCP 0-90 80 Degrees   L Ring PIP 0-100 80 Degrees   L Little  MCP 0-90 80 Degrees   L Little PIP 0-100 75 Degrees       HEP reviewed - had out provided - see pt instruction  heatingpad used because of some skin irritation in webspace                      OT Education - 10/03/15 1426    Education provided Yes   Education Details See pt instruction    Person(s) Educated Patient   Methods Explanation;Demonstration;Tactile cues;Verbal cues;Handout   Comprehension Verbal cues required;Returned demonstration;Verbalized understanding          OT Short Term Goals - 10/03/15 1443    OT SHORT TERM GOAL #1   Title Flexion of digtis improve for pt to  touch palm to hold brush or knife   Baseline MC's 75-80 degrees , PIP 75 to 90 degrees   Time 3   Period Weeks   Status New   OT SHORT TERM GOAL #2   Title Wrist AROM improve with 5-15 degrees in all planes to be able to use L hand at least 50% of time in bathing , dressing , cooking    Baseline Wrist see flowsheet for ROM ; more than 60% impaired rated by pt for acti   Time 3   Period Weeks   Status New           OT Long Term Goals - 10/03/15 1506    OT LONG TERM GOAL #8   Title Function on PRWHE improve at least with 10-15 points    Baseline PRWHE score for function 30/50 - on 19 Jan    Time 5   Period Weeks   Status New   OT LONG TERM GOAL  #9   Baseline Pt to show increase grip strenght in L to 50% compare to R to be able to hold pots, cut food    Time 4   Period Weeks   Status New               Plan - 10/03/15 1428    Clinical Impression Statement Pt present about 10 wks out from distal radius fracture - was casted a week - then Dr Para March " reset" and casted again for 8 wks - now about week in wrist splint - pt present with decrease ROM at wrist in all planes - decrease ROM in all digits - pt report after her wrist fracture 4 yrs ago she could not do  strong grip and did not had  full extention of digits - decrease grip  strenght in L hand - all of this lmiting her functional use of L hand in ADL's and IADL's    Pt will benefit from skilled therapeutic intervention in order to improve on the following deficits (Retired) Impaired flexibility;Decreased range of motion;Pain;Impaired UE functional use;Decreased strength   Rehab Potential Good   OT Frequency 2x / week   OT Duration 4 weeks   OT Treatment/Interventions Self-care/ADL training;Parrafin;Fluidtherapy;Moist Heat;Splinting;Patient/family education;Therapeutic exercises;Passive  range of motion;Manual Therapy   Plan reasses performance of HEP and progress - upgrade and check for MD strenghening    OT Home  Exercise Plan see pt instruction   Consulted and Agree with Plan of Care Patient        Problem List Patient Active Problem List   Diagnosis Date Noted  . Vitamin D deficiency 08/20/2014  . Secondary hyperparathyroidism ([place]) 08/20/2014    Rosalyn Gess OTR/l,CLT 10/03/2015, 3:17 PM  [place] [facility] [address] [place], [address] Phone: [phone]   Fax:  [phone]  Name: [name] MRN: [id number] Date of Birth: [birth year]

## 2015-10-03 NOTE — Therapy (Signed)
Education Details progress towards goals , POC   Person(s) Educated Patient   Methods Explanation   Comprehension Verbalized understanding             PT Long Term Goals - 10/03/15 1228    PT LONG TERM GOAL #1   Title pt will improve berg balance score to >46/56 to reduce fall risk   Baseline 38/56   Time 8   Period Weeks   Status Achieved   PT LONG TERM GOAL #2   Title pt will imporve 53mwalk speed to 1.224m for community mobility    Baseline 1.59m54m  Time 8   Period Weeks   Status Partially Met   PT LONG TERM GOAL #3   Title pt will improve 5x sit to stand to under 25s showimg improved leg strength.    Time 4   Period Weeks   Status Achieved   PT LONG TERM GOAL #4   Title pt will redcue TUG tim to <12s reducing fall risk   Time 8   Period Weeks   Status Achieved   PT LONG TERM GOAL #5    Title pt will improve DGI score to >19 to reduce fall risk   Time 4   Period Weeks   Status New               Plan - 10/03/15 1227    Clinical Impression Statement pt has made significant progress regarding her strenght, balance and mobility evidenced by dramatic improvement in outcome measures. she still is a high fall risk for dynamic activities and would benefit from continued skilled PT services to focus on this element of balance and mobility.    Pt will benefit from skilled therapeutic intervention in order to improve on the following deficits Decreased activity tolerance;Decreased balance;Decreased mobility;Dizziness;Difficulty walking;Decreased strength;Increased edema;Pain   Rehab Potential Good   PT Frequency 2x / week   PT Duration 8 weeks   PT Treatment/Interventions Aquatic Therapy;Neuromuscular re-education;Balance training;Therapeutic exercise;Therapeutic activities;Functional mobility training;Gait training;Stair training;Vestibular   PT Next Visit Plan Repeat outcome measures, recert vs discharge. Progress strengthening and balance. Incorporate head turning activities into balance training given canal dehiscence surgery.    PT Home Exercise Plan As prescribed        Problem List Patient Active Problem List   Diagnosis Date Noted  . Vitamin D deficiency 08/20/2014  . Secondary hyperparathyroidism ([place]CC[place]2/03/2014   AshGorden Harmsortorici, PT, DPT #[id number], 10/03/2015, 12:31 PM  [facility] [address] [place]: [phone]:  [phone]: Lariya A JorMartiniqueN: [id number] of Birth: [birth year]  Education Details progress towards goals , POC   Person(s) Educated Patient   Methods Explanation   Comprehension Verbalized understanding             PT Long Term Goals - 10/03/15 1228    PT LONG TERM GOAL #1   Title pt will improve berg balance score to >46/56 to reduce fall risk   Baseline 38/56   Time 8   Period Weeks   Status Achieved   PT LONG TERM GOAL #2   Title pt will imporve 53mwalk speed to 1.224m for community mobility    Baseline 1.59m54m  Time 8   Period Weeks   Status Partially Met   PT LONG TERM GOAL #3   Title pt will improve 5x sit to stand to under 25s showimg improved leg strength.    Time 4   Period Weeks   Status Achieved   PT LONG TERM GOAL #4   Title pt will redcue TUG tim to <12s reducing fall risk   Time 8   Period Weeks   Status Achieved   PT LONG TERM GOAL #5    Title pt will improve DGI score to >19 to reduce fall risk   Time 4   Period Weeks   Status New               Plan - 10/03/15 1227    Clinical Impression Statement pt has made significant progress regarding her strenght, balance and mobility evidenced by dramatic improvement in outcome measures. she still is a high fall risk for dynamic activities and would benefit from continued skilled PT services to focus on this element of balance and mobility.    Pt will benefit from skilled therapeutic intervention in order to improve on the following deficits Decreased activity tolerance;Decreased balance;Decreased mobility;Dizziness;Difficulty walking;Decreased strength;Increased edema;Pain   Rehab Potential Good   PT Frequency 2x / week   PT Duration 8 weeks   PT Treatment/Interventions Aquatic Therapy;Neuromuscular re-education;Balance training;Therapeutic exercise;Therapeutic activities;Functional mobility training;Gait training;Stair training;Vestibular   PT Next Visit Plan Repeat outcome measures, recert vs discharge. Progress strengthening and balance. Incorporate head turning activities into balance training given canal dehiscence surgery.    PT Home Exercise Plan As prescribed        Problem List Patient Active Problem List   Diagnosis Date Noted  . Vitamin D deficiency 08/20/2014  . Secondary hyperparathyroidism (HCCWauzeka2/03/2014   AshGorden Harmsortorici, PT, DPT #[id number], 10/03/2015, 12:31 PM  [facility] [address] [place]: [phone]:  [phone]: Lariya A JorMartiniqueN: [id number] of Birth: [birth year]

## 2015-10-03 NOTE — Patient Instructions (Signed)
Heat at start PROM for wrist ext/flex/RD and UD  AROM for RD/UD/SUp/Pro/ and wrist flexion and extention with close fist and open hand   8 reps x 2 day   Tendon glides - but each one separate Fist to bottom of palm  Opposition to all digits

## 2015-10-08 ENCOUNTER — Ambulatory Visit: Payer: 59 | Admitting: Occupational Therapy

## 2015-10-08 ENCOUNTER — Ambulatory Visit: Payer: 59

## 2015-10-08 DIAGNOSIS — M79632 Pain in left forearm: Secondary | ICD-10-CM

## 2015-10-08 DIAGNOSIS — M6281 Muscle weakness (generalized): Secondary | ICD-10-CM

## 2015-10-08 DIAGNOSIS — M25642 Stiffness of left hand, not elsewhere classified: Secondary | ICD-10-CM

## 2015-10-08 DIAGNOSIS — R2681 Unsteadiness on feet: Secondary | ICD-10-CM

## 2015-10-08 DIAGNOSIS — R531 Weakness: Secondary | ICD-10-CM | POA: Diagnosis not present

## 2015-10-08 DIAGNOSIS — M25632 Stiffness of left wrist, not elsewhere classified: Secondary | ICD-10-CM

## 2015-10-08 NOTE — Patient Instructions (Signed)
Same HEP - instead of AROM in all planes - add 1 lbs  12 reps   2 x day   Add teal putty for grip , lat and 3 point grip 10-12 reps - 2 x day   Can do some soft tissue massage to thumb webspace prior to thumb exercises

## 2015-10-08 NOTE — Therapy (Signed)
[place] [facility] [address] [place], [place], [address] Phone: [phone]   Fax:  [phone]  Physical Therapy Treatment  Patient Details  Name: [name] MRN: [id number] Date of Birth: 1951/07/27 Referring Provider: Buddy Duty  Encounter Date: 10/08/2015      PT End of Session - 10/08/15 1124    Visit Number 7   Number of Visits 17   Date for PT Re-Evaluation 10/31/15   PT Start Time 1003   PT Stop Time 1102   PT Time Calculation (min) 59 min   Equipment Utilized During Treatment Gait belt   Activity Tolerance Patient tolerated treatment well   Behavior During Therapy [facility] for tasks assessed/performed      Past Medical History  Diagnosis Date  . Peptic ulcer     Past Surgical History  Procedure Laterality Date  . Mini gastric bypass    . Cholecystectomy    . Breast surgery    . Knee surgery    . Shoulder surgery      There were no vitals filed for this visit.  Visit Diagnosis:  Muscle weakness  Unsteadiness on feet      Subjective Assessment - 10/08/15 1110    Subjective pt reports falling into the wall while turning in the bathroom but stayed upright. pt reports feeling a little more dizzy over past few days   Currently in Pain? No/denies   Pain Score 0-No pain         Neuromuscular re ed:  Balance Master training: sensory integration testing x 30 min see scanned document for details  Tandem stance with eyes closed 30 sec x 3 sets pt required CGA AP weight shift in // bars on airex 30 sec x 3 sets pt required CGA Toe touch from airex to BOSU ball in // bars 10 x 2 sets bilaterally pt required min cues for no UE support with CGA Tandem gait on 2x4 with bilateral head turns in // bars 2 laps pt required min cues for UE support with min assist for safety Continuous ambulation with bilateral head turns to identify objects 80 ft x 2 laps pt required min assist for safety          PT Long Term  Goals - 10/03/15 1228    PT LONG TERM GOAL #1   Title pt will improve berg balance score to >46/56 to reduce fall risk   Baseline 38/56   Time 8   Period Weeks   Status Achieved   PT LONG TERM GOAL #2   Title pt will imporve 22mwalk speed to 1.246m for community mobility    Baseline 1.67m567m  Time 8   Period Weeks   Status Partially Met   PT LONG TERM GOAL #3   Title pt will improve 5x sit to stand to under 25s showimg improved leg strength.    Time 4   Period Weeks   Status Achieved   PT LONG TERM GOAL #4   Title pt will redcue TUG tim to <12s reducing fall risk   Time 8   Period Weeks   Status Achieved   PT LONG TERM GOAL #5   Title pt will improve DGI score to >19 to reduce fall risk   Time 4   Period Weeks   Status New               Plan - 10/08/15 1125    Clinical Impression Statement pt  displays difficulty  in activities that challenge the vestibular and visual systems as demonstrated by the balance master with a composite score of 31/100 but has demonstrated improvement in balance activities requiring less UE support in NBOS positions. pt will continue to benefit from PT to reduce fall risk and maximize mobility.   Pt will benefit from skilled therapeutic intervention in order to improve on the following deficits Decreased activity tolerance;Decreased balance;Decreased mobility;Dizziness;Difficulty walking;Decreased strength;Increased edema;Pain   Rehab Potential Good   PT Frequency 2x / week   PT Duration 8 weeks   PT Treatment/Interventions Aquatic Therapy;Neuromuscular re-education;Balance training;Therapeutic exercise;Therapeutic activities;Functional mobility training;Gait training;Stair training;Vestibular   PT Next Visit Plan Repeat outcome measures, recert vs discharge. Progress strengthening and balance. Incorporate head turning activities into balance training given canal dehiscence surgery.    PT Home Exercise Plan As prescribed        Problem  List Patient Active Problem List   Diagnosis Date Noted  . Vitamin D deficiency 08/20/2014  . Secondary hyperparathyroidism ([place]) 08/20/2014   Domingo Pulse, SPT This entire session was performed under direct supervision and direction of a licensed therapist/therapist assistant . I have personally read, edited and approve of the note as written. Gorden Harms. [name], PT, DPT [phone]  [name],[name] 10/08/2015, 12:49 PM  [place] [facility] [address] [place], [place], [address] Phone: [phone]   Fax:  [phone]  Name: [name] MRN: [id number] Date of Birth: 02-05-1951

## 2015-10-08 NOTE — Therapy (Signed)
[place] [facility] [address], [place], [address] Phone: [phone]   Fax:  [phone]  Occupational Therapy Treatment  Patient Details  Name: [name] MRN: [id number] Date of Birth: 04-10-1951 Referring Provider: Para March  Encounter Date: 10/08/2015      OT End of Session - 10/08/15 0903    Visit Number 2   Number of Visits 8   Date for OT Re-Evaluation 10/31/15   OT Start Time 0805   OT Stop Time 0847   OT Time Calculation (min) 42 min   Activity Tolerance Patient tolerated treatment well   Behavior During Therapy [facility] for tasks assessed/performed      Past Medical History  Diagnosis Date  . Peptic ulcer     Past Surgical History  Procedure Laterality Date  . Mini gastric bypass    . Cholecystectomy    . Breast surgery    . Knee surgery    . Shoulder surgery      There were no vitals filed for this visit.  Visit Diagnosis:  Stiffness of left wrist joint  Muscle weakness  Stiffness of finger joint of left hand  Pain of left forearm      Subjective Assessment - 10/08/15 0825    Subjective  Little sore from the exercises - I did expect that - but it is getting better - try and go with out my splint little at home the last couple of days - seeing MD tomorrow    Patient Stated Goals Want to make fist again , and bend my wrist like before - want to do bathing and dressin easier, hold or lift object , cutting with knife, and cooking/house work    Currently in Pain? Yes   Pain Score 3    Pain Location Wrist   Pain Orientation Left   Pain Descriptors / Indicators Aching            [facility] OT Assessment - 10/08/15 0001    AROM   Left Forearm Supination 90 Degrees   Left Wrist Extension 50 Degrees   Left Wrist Flexion 41 Degrees   Left Wrist Radial Deviation 10 Degrees   Strength   Right Hand Grip (lbs) 30   Right Hand Lateral Pinch 15 lbs   Right Hand 3 Point Pinch 12 lbs   Left  Hand Grip (lbs) 15   Left Hand Lateral Pinch 10 lbs   Left Hand 3 Point Pinch 7 lbs                  OT Treatments/Exercises (OP) - 10/08/15 0001    Wrist Exercises   Other wrist exercises Wrist PROM  on edge of table for RD,UD.flexion and extention ;AROM sup /pron- add 1 lbs weight for  all directions     Hand Exercises   Other Hand Exercises Thumb CMC flexion PROM , PROM for PA and RA stretch , AROM opposition to base of 4th and 2nd fold of 5th    Other Hand Exercises Teal putty provided for grip , lat grip and 3 point - can also do 2 point alternate digits - 10-12 reps each    LUE Fluidotherapy   Number Minutes Fluidotherapy 12 Minutes   LUE Fluidotherapy Location Hand;Wrist   Comments At [facility] to decrease pain and increase ROM at wrist and thumb/digits   Manual Therapy   Manual therapy comments Soft tissue mobs done to L thumb webspace - and strethc  combine for PA and RA of thumb ; gentle traction to [facility] prior to ROM of thumb - to increase ROM                 OT Education - 10/08/15 0903    Education provided Yes   Education Details HEP    Person(s) Educated Patient   Methods Demonstration;Tactile cues;Verbal cues;Handout   Comprehension Returned demonstration;Verbalized understanding;Verbal cues required          OT Short Term Goals - 10/03/15 1443    OT SHORT TERM GOAL #1   Title Flexion of digtis improve for pt to touch palm to hold brush or knife   Baseline MC's 75-80 degrees , PIP 75 to 90 degrees   Time 3   Period Weeks   Status New   OT SHORT TERM GOAL #2   Title Wrist AROM improve with 5-15 degrees in all planes to be able to use L hand at least 50% of time in bathing , dressing , cooking    Baseline Wrist see flowsheet for ROM ; more than 60% impaired rated by pt for acti   Time 3   Period Weeks   Status New           OT Long Term Goals - 10/03/15 1506    OT LONG TERM GOAL #8   Title Function on PRWHE improve at least with 10-15 points     Baseline PRWHE score for function 30/50 - on 19 Jan    Time 5   Period Weeks   Status New   OT LONG TERM GOAL  #9   Baseline Pt to show increase grip strenght in L to 50% compare to R to be able to hold pots, cut food    Time 4   Period Weeks   Status New               Plan - 10/08/15 0903    Clinical Impression Statement Pt show improvement in wrist flexion and extention - supination back to 90 degrees - initiated this date 1 lbs weight for wrist - grip and prehension strength assess and add putty (teal ) for grip and prehension - pt  appt with MD tomorrow -  cont to increase RO Mand strength to return using L hand  in ADL' and IADL's    Pt will benefit from skilled therapeutic intervention in order to improve on the following deficits (Retired) Impaired flexibility;Decreased range of motion;Pain;Impaired UE functional use;Decreased strength   Rehab Potential Good   OT Frequency 2x / week   OT Duration 4 weeks   OT Treatment/Interventions Self-care/ADL training;Parrafin;Fluidtherapy;Moist Heat;Splinting;Patient/family education;Therapeutic exercises;Passive range of motion;Manual Therapy   Plan assess progress - check on how MD appt went    OT Home Exercise Plan see pt instruction   Consulted and Agree with Plan of Care Patient        Problem List Patient Active Problem List   Diagnosis Date Noted  . Vitamin D deficiency 08/20/2014  . Secondary hyperparathyroidism ([place]) 08/20/2014    Rosalyn Gess OTR/L,CLT 10/08/2015, 9:07 AM  [place] [facility] [address] [place], [address] Phone: [phone]   Fax:  [phone]  Name: [name] MRN: [id number] Date of Birth: 1951-06-14

## 2015-10-10 ENCOUNTER — Ambulatory Visit: Payer: 59

## 2015-10-10 ENCOUNTER — Ambulatory Visit: Payer: 59 | Admitting: Occupational Therapy

## 2015-10-10 DIAGNOSIS — M79632 Pain in left forearm: Secondary | ICD-10-CM

## 2015-10-10 DIAGNOSIS — M6281 Muscle weakness (generalized): Secondary | ICD-10-CM

## 2015-10-10 DIAGNOSIS — R531 Weakness: Secondary | ICD-10-CM | POA: Diagnosis not present

## 2015-10-10 DIAGNOSIS — R2681 Unsteadiness on feet: Secondary | ICD-10-CM

## 2015-10-10 DIAGNOSIS — M25632 Stiffness of left wrist, not elsewhere classified: Secondary | ICD-10-CM

## 2015-10-10 DIAGNOSIS — M25642 Stiffness of left hand, not elsewhere classified: Secondary | ICD-10-CM

## 2015-10-10 NOTE — Patient Instructions (Signed)
Same as last time -can use 16 oz hammer for sup/pro; RD and UD   Teal putty - but not over do thumb

## 2015-10-10 NOTE — Therapy (Signed)
[place] [facility] MAIN REHAB SERVICES [address] [place], [place], [address] Phone: [phone]   Fax:  [phone]  Physical Therapy Treatment  Patient Details  Name: [name] MRN: [id number] Date of Birth: 09/21/1950 Referring Provider: Kerr  Encounter Date: 10/10/2015      PT End of Session - 10/10/15 1255    Visit Number 8   Number of Visits 17   Date for PT Re-Evaluation 10/31/15   PT Start Time 1016   PT Stop Time 1100   PT Time Calculation (min) 44 min   Equipment Utilized During Treatment Gait belt   Activity Tolerance Patient tolerated treatment well   Behavior During Therapy WFL for tasks assessed/performed      Past Medical History  Diagnosis Date  . Peptic ulcer     Past Surgical History  Procedure Laterality Date  . Mini gastric bypass    . Cholecystectomy    . Breast surgery    . Knee surgery    . Shoulder surgery      There were no vitals filed for this visit.  Visit Diagnosis:  Muscle weakness  Unsteadiness on feet      Subjective Assessment - 10/10/15 1241    Subjective pt reports that the soreness she experienced after previous session has resolved. pt reports feeling dizzy in turn activities stating it feels like the room continues to spin.   Currently in Pain? No/denies   Pain Score 0-No pain          Neuromuscular re ed:  Forward ambulation with R and L head turns for 50 ft with a half turn and then continued ambulation in retro for 10 ft x 6 laps pt required min assist for safety Airex AP weight shift in // bars in staggered stance with eyes closed 45 sec x 3 sets pt required CGA and min cues for no UE support Airex in // bars in staggered stance with eyes closed 45 sec x 3 sets pt required min assist for safety and min cues for no UE support Airex in // bars with lateral weight shifts with eyes closed 45 sec x 3 sets  pt required min assist for safety and min cues for no UE support Airex in  // bars in tandem stance with bilateral head turns 10 x 3 sets  pt required CGA for safety and min cues for no UE support  Therapeutic exercise:  Forward step ups from airex in // bars 10 x 2 sets pt required CGA and min cues for no UE support Bilateral step ups from airex in // bars 10 x 2 sets pt required CGA and min cues for no UE support 3 step march with leg hold for 3 sec 10 x 2 sets pt required CGA and min cues for hold and required 1 finger UE support         PT Long Term Goals - 10/03/15 1228    PT LONG TERM GOAL #1   Title pt will improve berg balance score to >46/56 to reduce fall risk   Baseline 38/56   Time 8   Period Weeks   Status Achieved   PT LONG TERM GOAL #2   Title pt will imporve 10m walk speed to 1.2m/s for community mobility    Baseline 1.0m/s   Time 8   Period Weeks   Status Partially Met   PT LONG TERM GOAL #3   Title pt will improve 5x sit to   stand to under 25s showimg improved leg strength.    Time 4   Period Weeks   Status Achieved   PT LONG TERM GOAL #4   Title pt will redcue TUG tim to <12s reducing fall risk   Time 8   Period Weeks   Status Achieved   PT LONG TERM GOAL #5   Title pt will improve DGI score to >19 to reduce fall risk   Time 4   Period Weeks   Status New               Plan - 10/10/15 1255    Clinical Impression Statement pt experiences difficulty in turning activities, which is how she fell a few weeks ago, and eyes closed activities. pt will benefit from continued therapy   Pt will benefit from skilled therapeutic intervention in order to improve on the following deficits Decreased activity tolerance;Decreased balance;Decreased mobility;Dizziness;Difficulty walking;Decreased strength;Increased edema;Pain   Rehab Potential Good   PT Frequency 2x / week   PT Duration 8 weeks   PT Treatment/Interventions Aquatic Therapy;Neuromuscular re-education;Balance training;Therapeutic exercise;Therapeutic activities;Functional  mobility training;Gait training;Stair training;Vestibular   PT Next Visit Plan Repeat outcome measures, recert vs discharge. Progress strengthening and balance. Incorporate head turning activities into balance training given canal dehiscence surgery.    PT Home Exercise Plan As prescribed        Problem List Patient Active Problem List   Diagnosis Date Noted  . Vitamin D deficiency 08/20/2014  . Secondary hyperparathyroidism ([place]) 08/20/2014   Domingo Pulse, SPT This entire session was performed under direct supervision and direction of a licensed therapist/therapist assistant . I have personally read, edited and approve of the note as written. Gorden Harms. [name], PT, DPT [phone]   [name],[name] 10/10/2015, 5:18 PM  [place] MAIN [facility] SERVICES [address] [place], [place], [address] Phone: [phone]   Fax:  [phone]  Name: [name] MRN: [id number] Date of Birth: [birth year]

## 2015-10-10 NOTE — Therapy (Signed)
[place] [facility] [address], [place], [address] Phone: [phone]   Fax:  [phone]  Occupational Therapy Treatment  Patient Details  Name: [name] MRN: [id number] Date of Birth: 26-Feb-1951 Referring Provider: Para March  Encounter Date: 10/10/2015      OT End of Session - 10/10/15 1404    Visit Number 3   Number of Visits 8   Date for OT Re-Evaluation 10/31/15   OT Start Time [id number]   OT Stop Time 1430   OT Time Calculation (min) 41 min      Past Medical History  Diagnosis Date  . Peptic ulcer     Past Surgical History  Procedure Laterality Date  . Mini gastric bypass    . Cholecystectomy    . Breast surgery    . Knee surgery    . Shoulder surgery      There were no vitals filed for this visit.  Visit Diagnosis:  Muscle weakness  Stiffness of left wrist joint  Stiffness of finger joint of left hand  Pain of left forearm      Subjective Assessment - 10/10/15 1358    Subjective  Doing okay - seen MD - xtay showed good healing - and to cont with therapy - soreness more but using it more - I can take the splint off more per MD    Patient Stated Goals Want to make fist again , and bend my wrist like before - want to do bathing and dressin easier, hold or lift object , cutting with knife, and cooking/house work    Currently in Pain? Yes   Pain Score 3    Pain Orientation Left   Pain Descriptors / Indicators Sore                      OT Treatments/Exercises (OP) - 10/10/15 0001    Wrist Exercises   Other wrist exercises PROM for wrist lexion, extentiion and RD/UD - then hammer for sup/pro 10 reps    Other wrist exercises 16 oz hammer for UD/RD and 1 lbs for wrist flexion and extention 10 reps    Hand Exercises   Other Hand Exercises tendonglides - AROM - then composite fist , PROM composite , AROM - Thumb CMC flexion PROM , PROM for PA and RA stretch , AROM opposition to  base of 4th and 2nd fold of 5th    Other Hand Exercises Teal putty provided for grip , lat grip and 3 point - can also roll putty and  do 2 point alternate digits - 10-12 reps each    LUE Fluidotherapy   Number Minutes Fluidotherapy 10 Minutes   LUE Fluidotherapy Location Hand;Wrist   Comments At [facility] to decrease pain and increase ROM    Manual Therapy   Manual therapy comments Soft tissue mobs to  [facility] spreads and joint mobs to MC's of diigst prior to fisting                 OT Education - 10/10/15 1404    Education provided Yes   Education Details HEP   Person(s) Educated Patient   Methods Explanation;Demonstration;Tactile cues;Verbal cues   Comprehension Verbal cues required;Returned demonstration;Verbalized understanding          OT Short Term Goals - 10/03/15 1443    OT SHORT TERM GOAL #1   Title Flexion of digtis improve for pt to touch palm to hold brush  or knife   Baseline MC's 75-80 degrees , PIP 75 to 90 degrees   Time 3   Period Weeks   Status New   OT SHORT TERM GOAL #2   Title Wrist AROM improve with 5-15 degrees in all planes to be able to use L hand at least 50% of time in bathing , dressing , cooking    Baseline Wrist see flowsheet for ROM ; more than 60% impaired rated by pt for acti   Time 3   Period Weeks   Status New           OT Long Term Goals - 10/03/15 1506    OT LONG TERM GOAL #8   Title Function on PRWHE improve at least with 10-15 points    Baseline PRWHE score for function 30/50 - on 19 Jan    Time 5   Period Weeks   Status New   OT LONG TERM GOAL  #9   Baseline Pt to show increase grip strenght in L to 50% compare to R to be able to hold pots, cut food    Time 4   Period Weeks   Status New               Plan - 10/10/15 1404    Clinical Impression Statement Pt cont to make progress in fisting , thumb ROM and wrist in all planes - MD wrote order to cont OT - no limitations nad more out of splint - pt to increase use of L  hand in ADL's and strengtheing    Pt will benefit from skilled therapeutic intervention in order to improve on the following deficits (Retired) Impaired flexibility;Decreased range of motion;Pain;Impaired UE functional use;Decreased strength   Rehab Potential Good   OT Frequency 2x / week   OT Duration 4 weeks   OT Treatment/Interventions Self-care/ADL training;Parrafin;Fluidtherapy;Moist Heat;Splinting;Patient/family education;Therapeutic exercises;Passive range of motion;Manual Therapy   Plan assess ROM , ADL's use of L hand    OT Home Exercise Plan see pt instruction   Consulted and Agree with Plan of Care Patient        Problem List Patient Active Problem List   Diagnosis Date Noted  . Vitamin D deficiency 08/20/2014  . Secondary hyperparathyroidism ([place]) 08/20/2014    Rosalyn Gess OTR/L,CLT 10/10/2015, 2:31 PM  [facility] [place] [place] [facility] [address] [place], [address] Phone: [phone]   Fax:  [phone]  Name: Inaya A Burns MRN: [id number] Date of Birth: 07/30/1951

## 2015-10-15 ENCOUNTER — Ambulatory Visit: Payer: 59 | Admitting: Occupational Therapy

## 2015-10-15 ENCOUNTER — Ambulatory Visit: Payer: 59

## 2015-10-15 DIAGNOSIS — R531 Weakness: Secondary | ICD-10-CM | POA: Diagnosis not present

## 2015-10-15 DIAGNOSIS — M25632 Stiffness of left wrist, not elsewhere classified: Secondary | ICD-10-CM

## 2015-10-15 DIAGNOSIS — M25642 Stiffness of left hand, not elsewhere classified: Secondary | ICD-10-CM

## 2015-10-15 DIAGNOSIS — M6281 Muscle weakness (generalized): Secondary | ICD-10-CM

## 2015-10-15 DIAGNOSIS — R2681 Unsteadiness on feet: Secondary | ICD-10-CM

## 2015-10-15 NOTE — Patient Instructions (Signed)
Same but can do last 5 min of heat - wrist flexion stretch 5 min  2 lbs for wrist in all planes -stop before pain  Do 2 x 6 reps   Putty hold off on lat and 3 point  Only gripping , pulling with all digts And then extention digits stretch for PIP's and rolling

## 2015-10-15 NOTE — Therapy (Signed)
[place] [facility] [address] [place], [address] Phone: [phone]   Fax:  [phone]  Occupational Therapy Treatment  Patient Details  Name: [name] MRN: [id number] Date of Birth: 1950/12/03 Referring Provider: Para March  Encounter Date: 10/15/2015      OT End of Session - 10/15/15 0854    Visit Number 4   Number of Visits 8   Date for OT Re-Evaluation 10/31/15   OT Start Time 0806   OT Stop Time 0847   OT Time Calculation (min) 41 min   Activity Tolerance Patient tolerated treatment well   Behavior During Therapy [facility] for tasks assessed/performed      Past Medical History  Diagnosis Date  . Peptic ulcer     Past Surgical History  Procedure Laterality Date  . Mini gastric bypass    . Cholecystectomy    . Breast surgery    . Knee surgery    . Shoulder surgery      There were no vitals filed for this visit.  Visit Diagnosis:  Muscle weakness  Stiffness of left wrist joint  Stiffness of finger joint of left hand      Subjective Assessment - 10/15/15 0823    Subjective  Doing okay - doing more - still with certain movement pain with spike to 7/10 - still hard to pick up anything with some weight like ice pitcher or open jar   Patient Stated Goals Want to make fist again , and bend my wrist like before - want to do bathing and dressin easier, hold or lift object , cutting with knife, and cooking/house work    Currently in Pain? Yes   Pain Score 7    Pain Location Wrist   Pain Orientation Left   Pain Descriptors / Indicators Sore            [facility] OT Assessment - 10/15/15 0001    AROM   Left Wrist Extension 58 Degrees   Left Wrist Flexion 48 Degrees   Left Wrist Radial Deviation 18 Degrees   Left Wrist Ulnar Deviation 30 Degrees   Strength   Right Hand Grip (lbs) 30   Right Hand Lateral Pinch 15 lbs   Right Hand 3 Point Pinch 12 lbs   Left Hand Grip (lbs) 20   Left Hand  Lateral Pinch 11 lbs   Left Hand 3 Point Pinch 9.5 lbs   Left Hand AROM   L Index  MCP 0-90 84 Degrees   L Index PIP 0-100 100 Degrees   L Long  MCP 0-90 85 Degrees   L Long PIP 0-100 95 Degrees   L Ring  MCP 0-90 85 Degrees   L Ring PIP 0-100 95 Degrees   L Little  MCP 0-90 82 Degrees   L Little PIP 0-100 90 Degrees                  OT Treatments/Exercises (OP) - 10/15/15 0001    Wrist Exercises   Other wrist exercises Measurements taken (see flowsheet    Other wrist exercises Wrist flexion on CPM 200 sec, 2 lbs for wrist flexion 12 reps ' 2 lbs wrist sup/pro, UD and RD to side 12 reps    Hand Exercises   Other Hand Exercises Digits extention on putty - with strech with R hand behind PIP's    Other Hand Exercises teal putty for gripping and pulling - no lat and 3 point  grip    LUE Fluidotherapy   Number Minutes Fluidotherapy 10 Minutes   LUE Fluidotherapy Location Hand;Wrist   Comments At [facility] to increase ROM  at wrist flexion , thumb ROM - decrease pain    Manual Therapy   Manual therapy comments Soft tissue mobs to  [facility] spreads and joint mobs to MC's of diigst extention - also webspace spreads with AROM PA and RA afterwards                 OT Education - 10/15/15 0854    Education provided Yes   Education Details HEP   Person(s) Educated Patient   Methods Explanation;Demonstration;Tactile cues;Verbal cues;Handout   Comprehension Verbal cues required;Returned demonstration;Verbalized understanding          OT Short Term Goals - 10/03/15 1443    OT SHORT TERM GOAL #1   Title Flexion of digtis improve for pt to touch palm to hold brush or knife   Baseline MC's 75-80 degrees , PIP 75 to 90 degrees   Time 3   Period Weeks   Status New   OT SHORT TERM GOAL #2   Title Wrist AROM improve with 5-15 degrees in all planes to be able to use L hand at least 50% of time in bathing , dressing , cooking    Baseline Wrist see flowsheet for ROM ; more than 60%  impaired rated by pt for acti   Time 3   Period Weeks   Status New           OT Long Term Goals - 10/03/15 1506    OT LONG TERM GOAL #8   Title Function on PRWHE improve at least with 10-15 points    Baseline PRWHE score for function 30/50 - on 19 Jan    Time 5   Period Weeks   Status New   OT LONG TERM GOAL  #9   Baseline Pt to show increase grip strenght in L to 50% compare to R to be able to hold pots, cut food    Time 4   Period Weeks   Status New               Plan - 10/15/15 0854    Clinical Impression Statement Pt made great progress since last times when taking measurements - 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 - R and L -  hold off on prehension strenghenign with putty  - but did increase to 2 lbs for wrist but do stop prior ot pain - and focus on wrist flexion strethc    Pt will benefit from skilled therapeutic intervention in order to improve on the following deficits (Retired) Impaired flexibility;Decreased range of motion;Pain;Impaired UE functional use;Decreased strength   Rehab Potential Good   OT Frequency 2x / week   OT Duration 2 weeks   OT Treatment/Interventions Self-care/ADL training;Parrafin;Fluidtherapy;Moist Heat;Splinting;Patient/family education;Therapeutic exercises;Passive range of motion;Manual Therapy   Plan assess flexion , pain ?   OT Home Exercise Plan see pt instruction   Consulted and Agree with Plan of Care Patient        Problem List Patient Active Problem List   Diagnosis Date Noted  . Vitamin D deficiency 08/20/2014  . Secondary hyperparathyroidism ([place]) 08/20/2014    Rosalyn Gess OTR/L,CLT 10/15/2015, 8:57 AM  [place] [facility] [address] [place], [address] Phone: [phone]   Fax:  [phone]  Name: [name]  MRN: [id number] Date of Birth: [birth year]

## 2015-10-15 NOTE — Therapy (Addendum)
[place] MAIN [facility] SERVICES [address] [place], [place], [address] Phone: [phone]   Fax:  [phone]  Physical Therapy Treatment  Patient Details  Name: [name] MRN: [id number] Date of Birth: [birth year] Referring Provider: Buddy Duty  Encounter Date: 10/15/2015      PT End of Session - 10/15/15 1130    Visit Number 9   Number of Visits 17   Date for PT Re-Evaluation 10/31/15   PT Start Time 1017   PT Stop Time 1107   PT Time Calculation (min) 50 min   Equipment Utilized During Treatment Gait belt   Activity Tolerance Patient tolerated treatment well   Behavior During Therapy [facility] for tasks assessed/performed      Past Medical History  Diagnosis Date  . Peptic ulcer     Past Surgical History  Procedure Laterality Date  . Mini gastric bypass    . Cholecystectomy    . Breast surgery    . Knee surgery    . Shoulder surgery      There were no vitals filed for this visit.  Visit Diagnosis:  Weakness  Unsteadiness on feet      Subjective Assessment - 10/15/15 1117    Subjective pt reports having a headache that began yesterday and feels a little worse today. pt asked about discharging her before leaving on vacation. pt reports being able to pick things off the floor more easily and feeling like she has improved balance.   Currently in Pain? Yes   Pain Score 2    Pain Location --  headache   Pain Descriptors / Indicators Constant;Aching   Pain Onset Yesterday   Multiple Pain Sites No       6 min on NUSTEP warm up - no charge  Neuromuscular re ed:  Bilateral and vertical head turns on airex in // bars 3 x 30 sec Staggered stance on airex with weight shifts in // bars 3 x 30 sec Staggered stance on arex with eyes closed on // bars 3 x 30 sec Tandem gait with head turns every 2 steps on 2x4 in // bars 3 laps  For these activities, pt required CGA for safety  BOSU ball lunge from airex forward and bilateral  leg in // bars 2 x 10 reps   Pt required min cues for knee bend and form for glut activation with min assist for safety  Ambulation with head turns in hall 3 laps of 70 ft  Pt required CGA for safety           PT Long Term Goals - 10/03/15 1228    PT LONG TERM GOAL #1   Title pt will improve berg balance score to >46/56 to reduce fall risk   Baseline 38/56   Time 8   Period Weeks   Status Achieved   PT LONG TERM GOAL #2   Title pt will imporve 41mwalk speed to 1.223m for community mobility    Baseline 1.74m55m  Time 8   Period Weeks   Status Partially Met   PT LONG TERM GOAL #3   Title pt will improve 5x sit to stand to under 25s showimg improved leg strength.    Time 4   Period Weeks   Status Achieved   PT LONG TERM GOAL #4   Title pt will redcue TUG tim to <12s reducing fall risk   Time 8   Period Weeks   Status Achieved  PT LONG TERM GOAL #5   Title pt will improve DGI score to >19 to reduce fall risk   Time 4   Period Weeks   Status New               Plan - 10/15/15 1131    Clinical Impression Statement pt demonstrates improved dynamic and static balance with head turning activities and well as SLS activities. pt has made good progress with goals and asked about being discharged before planned date. pt still struggles with eyes closed static activity and a full spin with L>R difficulty   Pt will benefit from skilled therapeutic intervention in order to improve on the following deficits Decreased activity tolerance;Decreased balance;Decreased mobility;Dizziness;Difficulty walking;Decreased strength;Increased edema;Pain   Rehab Potential Good   PT Frequency 2x / week   PT Duration 8 weeks   PT Treatment/Interventions Aquatic Therapy;Neuromuscular re-education;Balance training;Therapeutic exercise;Therapeutic activities;Functional mobility training;Gait training;Stair training;Vestibular   PT Next Visit Plan Repeat outcome measures, recert vs discharge.  Progress strengthening and balance. Incorporate head turning activities into balance training given canal dehiscence surgery.    PT Home Exercise Plan As prescribed        Problem List Patient Active Problem List   Diagnosis Date Noted  . Vitamin D deficiency 08/20/2014  . Secondary hyperparathyroidism ([place]) 08/20/2014   Domingo Pulse, SPT This entire session was performed under direct supervision and direction of a licensed therapist/therapist assistant . I have personally read, edited and approve of the note as written. Gorden Harms. [name], PT, DPT [phone]   [name],[name] 10/15/2015, 1:16 PM  [place] MAIN [facility] SERVICES [address] [place], [place], [address] Phone: [phone]   Fax:  [phone]  Name: [name] MRN: [id number] Date of Birth: Jan 14, 1951

## 2015-10-17 ENCOUNTER — Ambulatory Visit: Payer: 59 | Attending: Internal Medicine

## 2015-10-17 ENCOUNTER — Ambulatory Visit: Payer: 59 | Admitting: Occupational Therapy

## 2015-10-17 DIAGNOSIS — M25642 Stiffness of left hand, not elsewhere classified: Secondary | ICD-10-CM | POA: Diagnosis present

## 2015-10-17 DIAGNOSIS — I890 Lymphedema, not elsewhere classified: Secondary | ICD-10-CM | POA: Insufficient documentation

## 2015-10-17 DIAGNOSIS — R2681 Unsteadiness on feet: Secondary | ICD-10-CM

## 2015-10-17 DIAGNOSIS — M79632 Pain in left forearm: Secondary | ICD-10-CM | POA: Insufficient documentation

## 2015-10-17 DIAGNOSIS — R531 Weakness: Secondary | ICD-10-CM | POA: Insufficient documentation

## 2015-10-17 DIAGNOSIS — M6281 Muscle weakness (generalized): Secondary | ICD-10-CM | POA: Diagnosis present

## 2015-10-17 DIAGNOSIS — M25632 Stiffness of left wrist, not elsewhere classified: Secondary | ICD-10-CM | POA: Diagnosis present

## 2015-10-17 NOTE — Therapy (Signed)
[place] MAIN [facility] SERVICES [address] [place], [place], [address] Phone: [phone]   Fax:  [phone]  Physical Therapy Treatment  Patient Details  Name: [name] MRN: [id number] Date of Birth: 07-Sep-1951 Referring Provider: Buddy Duty  Encounter Date: 10/17/2015      PT End of Session - 10/17/15 1124    Visit Number 10   Number of Visits 17   Date for PT Re-Evaluation 10/31/15   PT Start Time 1017   PT Stop Time 1104   PT Time Calculation (min) 47 min   Equipment Utilized During Treatment Gait belt   Activity Tolerance Patient tolerated treatment well   Behavior During Therapy [facility] for tasks assessed/performed      Past Medical History  Diagnosis Date  . Peptic ulcer     Past Surgical History  Procedure Laterality Date  . Mini gastric bypass    . Cholecystectomy    . Breast surgery    . Knee surgery    . Shoulder surgery      There were no vitals filed for this visit.  Visit Diagnosis:  Unsteadiness on feet  Weakness      Subjective Assessment - 10/17/15 1121    Subjective pt reports having a fall yesterday after doing church activities for 3 hrs. pt states she went around a table and attempted to go through a narrow space when she got dizzy and lost her balance, landing in a chair. pt reports feeling tired today.   Currently in Pain? No/denies   Pain Score 0-No pain   Pain Onset Yesterday      Therapeutic exercise:  Mini squats on airex in // bars 10 x 2 sets and 10 with red band at knees pt required mod cues for hip and knee flexion and abduction with CGA for safety Lateral band walks in // bars with red band x 5 laps pt required min cues for stepping pattern and form with CGA for safety  Neuromuscluar re ed:  Ambulation 20 ft with half turns x 6 pt required CGA for safety Ambulation 81f with bilateral head turns x 2 pt required CGA for safety Ambulation 20 ft with full turns x 4 pt required mod assist  for safety but showed improvement with repetition   Staggered stance on airex AP weight shifts 30 sec x 3 sets pt required min cues for full weight shift with CGA Balloon toss on airex in // bars 1 min x 3 pt required min assist for safety                                PT Long Term Goals - 10/03/15 1228    PT LONG TERM GOAL #1   Title pt will improve berg balance score to >46/56 to reduce fall risk   Baseline 38/56   Time 8   Period Weeks   Status Achieved   PT LONG TERM GOAL #2   Title pt will imporve 1744malk speed to 1.29m28mfor community mobility    Baseline 1.44m/29m Time 8   Period Weeks   Status Partially Met   PT LONG TERM GOAL #3   Title pt will improve 5x sit to stand to under 25s showimg improved leg strength.    Time 4   Period Weeks   Status Achieved   PT LONG TERM GOAL #4   Title pt will redcue  TUG tim to <12s reducing fall risk   Time 8   Period Weeks   Status Achieved   PT LONG TERM GOAL #5   Title pt will improve DGI score to >19 to reduce fall risk   Time 4   Period Weeks   Status New               Plan - 10/17/15 1125    Clinical Impression Statement pt demonstrates improved balance with turning activities and obstacles requiring step over and lateral movement. pt experienced dizziness with the ballon toss requiring a breaks settle. pt is making good progress to pt goals.   Pt will benefit from skilled therapeutic intervention in order to improve on the following deficits Decreased activity tolerance;Decreased balance;Decreased mobility;Dizziness;Difficulty walking;Decreased strength;Increased edema;Pain   Rehab Potential Good   PT Frequency 2x / week   PT Duration 8 weeks   PT Treatment/Interventions Aquatic Therapy;Neuromuscular re-education;Balance training;Therapeutic exercise;Therapeutic activities;Functional mobility training;Gait training;Stair training;Vestibular   PT Next Visit Plan Repeat outcome measures, recert  vs discharge. Progress strengthening and balance. Incorporate head turning activities into balance training given canal dehiscence surgery.    PT Home Exercise Plan As prescribed        Problem List Patient Active Problem List   Diagnosis Date Noted  . Vitamin D deficiency 08/20/2014  . Secondary hyperparathyroidism ([place]) 08/20/2014   Domingo Pulse, SPT This entire session was performed under direct supervision and direction of a licensed therapist/therapist assistant . I have personally read, edited and approve of the note as written.  Gorden Harms. [name], PT, DPT [phone]  [name],[name] 10/17/2015, 1:22 PM  [place] MAIN [facility] SERVICES [address] [place], [place], [address] Phone: [phone]   Fax:  [phone]  Name: [name] MRN: [id number] Date of Birth: June 05, 1951

## 2015-10-17 NOTE — Therapy (Signed)
[place] [facility] [address], [place], [address] Phone: [phone]   Fax:  [phone]  Occupational Therapy Treatment  Patient Details  Name: [name] MRN: [id number] Date of Birth: Jul 16, 1951 Referring Provider: Para March  Encounter Date: 10/17/2015      OT End of Session - 10/17/15 1635    Visit Number 5   Number of Visits 8   Date for OT Re-Evaluation 10/31/15   OT Start Time 1325   OT Stop Time 1355   OT Time Calculation (min) 30 min   Activity Tolerance Patient tolerated treatment well   Behavior During Therapy [facility] for tasks assessed/performed      Past Medical History  Diagnosis Date  . Peptic ulcer     Past Surgical History  Procedure Laterality Date  . Mini gastric bypass    . Cholecystectomy    . Breast surgery    . Knee surgery    . Shoulder surgery      There were no vitals filed for this visit.  Visit Diagnosis:  Muscle weakness  Stiffness of finger joint of left hand  Stiffness of left wrist joint      Subjective Assessment - 10/17/15 1628    Subjective  I am sleepy and tired today - did not sleep good - but I was using my hand more yesterday and was hurting little more - but more soreness - can do much more with my hand now    Patient Stated Goals Want to make fist again , and bend my wrist like before - want to do bathing and dressin easier, hold or lift object , cutting with knife, and cooking/house work    Currently in Pain? No/denies            [facility] OT Assessment - 10/17/15 0001    AROM   Left Forearm Supination 90 Degrees   Left Wrist Extension 58 Degrees   Left Wrist Flexion 60 Degrees   Left Wrist Radial Deviation 18 Degrees   Left Wrist Ulnar Deviation 30 Degrees                  OT Treatments/Exercises (OP) - 10/17/15 0001    Wrist Exercises   Other wrist exercises Wrist AROM measured for flexon and extnetion    Other wrist exercises M/M  for wrist in all planes 4+/5   Hand Exercises   Other Hand Exercises Digits extention on putty - with strech with R hand behind PIP's    Other Hand Exercises Rolling of putty for extention - pt able to lay hand flat o ntalbe after Graston - and full extnetion of 2nd and 5th    Manual Therapy   Manual therapy comments Soft tissue mobs done using Graston tools  for palm over thena and hypo thenar eminence -  and inbetween MC's and along volar digits - using tool 2- 4  and doing sweeping , scooping , brushing ,                 OT Education - 10/17/15 1634    Education provided Yes   Education Details ed on using Graston tools and what to expect    Person(s) Educated Patient   Methods Explanation;Demonstration;Tactile cues;Verbal cues   Comprehension Verbalized understanding          OT Short Term Goals - 10/17/15 1638    OT SHORT TERM GOAL #1   Title Flexion of digtis  improve for pt to touch palm to hold brush or knife   Baseline touching palm   Status Achieved   OT SHORT TERM GOAL #2   Title Wrist AROM improve with 5-15 degrees in all planes to be able to use L hand at least 50% of time in bathing , dressing , cooking    Baseline see flowsheet   Status Achieved           OT Long Term Goals - 10/17/15 1638    OT LONG TERM GOAL #8   Title Function on PRWHE improve at least with 10-15 points    Baseline need to assess PRWHE next time    Time 2   Period Weeks   Status On-going   OT LONG TERM GOAL  #9   Baseline Pt to show increase grip strenght in L to 50% compare to R to be able to hold pots, cut food    Time 2   Period Weeks   Status On-going               Plan - 10/17/15 1636    Clinical Impression Statement Pt making great progress in ROM at wrist and strength - report increase use of R hand - but digits extention  bothering her a lot and not able to get palm flat together - but after manaul therapy  using Graston tools with ROM and stretches pt was  able  to show increase ROM in dgits extnetion and palm able to touch other palm    Pt will benefit from skilled therapeutic intervention in order to improve on the following deficits (Retired) Impaired flexibility;Decreased range of motion;Pain;Impaired UE functional use;Decreased strength   Rehab Potential Good   OT Frequency 2x / week   OT Duration Other (comment)   OT Treatment/Interventions Self-care/ADL training;Parrafin;Fluidtherapy;Moist Heat;Splinting;Patient/family education;Therapeutic exercises;Passive range of motion;Manual Therapy   Plan manual therapy using graston tools    OT Home Exercise Plan see pt instruction   Consulted and Agree with Plan of Care Patient        Problem List Patient Active Problem List   Diagnosis Date Noted  . Vitamin D deficiency 08/20/2014  . Secondary hyperparathyroidism ([place]) 08/20/2014    Rosalyn Gess OTR/L,CLT 10/17/2015, 4:40 PM  [place] [facility] [address], [place], [address] Phone: [phone]   Fax:  [phone]  Name: [name] MRN: [id number] Date of Birth: May 18, 1951

## 2015-10-17 NOTE — Patient Instructions (Signed)
Same HEP  

## 2015-10-22 ENCOUNTER — Ambulatory Visit: Payer: 59

## 2015-10-22 ENCOUNTER — Ambulatory Visit: Payer: 59 | Admitting: Occupational Therapy

## 2015-10-22 DIAGNOSIS — R2681 Unsteadiness on feet: Secondary | ICD-10-CM

## 2015-10-22 DIAGNOSIS — M25642 Stiffness of left hand, not elsewhere classified: Secondary | ICD-10-CM

## 2015-10-22 DIAGNOSIS — M25632 Stiffness of left wrist, not elsewhere classified: Secondary | ICD-10-CM

## 2015-10-22 DIAGNOSIS — M6281 Muscle weakness (generalized): Secondary | ICD-10-CM

## 2015-10-22 NOTE — Therapy (Signed)
[place] MAIN [facility] SERVICES [address] [place], [place], [address] Phone: [phone]   Fax:  [phone]  Physical Therapy Treatment  Patient Details  Name: [name] MRN: [id number] Date of Birth: [birth year] Referring Provider: Buddy Duty  Encounter Date: 10/22/2015      PT End of Session - 10/22/15 1200    Visit Number 11   Number of Visits 17   Date for PT Re-Evaluation 10/31/15   PT Start Time 1017   PT Stop Time 1115   PT Time Calculation (min) 58 min   Equipment Utilized During Treatment Gait belt   Activity Tolerance Patient tolerated treatment well   Behavior During Therapy [facility] for tasks assessed/performed      Past Medical History  Diagnosis Date  . Peptic ulcer     Past Surgical History  Procedure Laterality Date  . Mini gastric bypass    . Cholecystectomy    . Breast surgery    . Knee surgery    . Shoulder surgery      There were no vitals filed for this visit.  Visit Diagnosis:  Muscle weakness  Unsteadiness on feet      Subjective Assessment - 10/22/15 1154    Subjective pt reports feeling like she is being pulled to the right since yesterday and almost falling twice accompanied with some dizziness. pt states that it has gotten less intense since yesterday but is still present.    Currently in Pain? No/denies   Pain Score 0-No pain     Therapeutic exercise:  SPT screened for signs and symptoms of CVA: Negative for slurred speech Negative for facial droop MMT demonstrated mild to moderate weakness of R>L UE and LE  = shoulder elevation at 90 degrees BP: 124/48 Sensation: WNL Finger to nose (-) Heel to shin (-) Rapid alternating movements (-) Mild intermittent deviation to R in gait  DGI was performed to measure pt progress 19/24 25mwalk: 1.09 m/s  Neuromuscular re ed:  Forward and bilateral step overs 1 min x 3 sets Pt required min cues for foot clearance with CGA Forward weight shift  step onto airex in // bars no UE support Pt required min cues for sequencing           PT Long Term Goals - 10/03/15 1228    PT LONG TERM GOAL #1   Title pt will improve berg balance score to >46/56 to reduce fall risk   Baseline 38/56   Time 8   Period Weeks   Status Achieved   PT LONG TERM GOAL #2   Title pt will imporve 148malk speed to 1.87m39mfor community mobility    Baseline 1.22m/64m Time 8   Period Weeks   Status Partially Met   PT LONG TERM GOAL #3   Title pt will improve 5x sit to stand to under 25s showimg improved leg strength.    Time 4   Period Weeks   Status Achieved   PT LONG TERM GOAL #4   Title pt will redcue TUG tim to <12s reducing fall risk   Time 8   Period Weeks   Status Achieved   PT LONG TERM GOAL #5   Title pt will improve DGI score to >19 to reduce fall risk   Time 4   Period Weeks   Status New               Plan - 10/22/15 1201  Clinical Impression Statement pt demonstrated improvement in her DGI score showing an improvement in dynamic balance activities with some deviation to the right in ambulation and more difficulty with R sided activities. MMT of UE and LE was compared showing mild to moderate weakness on the R side with intact sensation. pt does not demonstrate facial droping or deviation in eye movement. pts PCP was notified regarding her R sided weakness and gait deviation and pt was instructed to go to ER if symptoms worsen.   Pt will benefit from skilled therapeutic intervention in order to improve on the following deficits Decreased activity tolerance;Decreased balance;Decreased mobility;Dizziness;Difficulty walking;Decreased strength;Increased edema;Pain   Rehab Potential Good   PT Frequency 2x / week   PT Duration 8 weeks   PT Treatment/Interventions Aquatic Therapy;Neuromuscular re-education;Balance training;Therapeutic exercise;Therapeutic activities;Functional mobility training;Gait training;Stair training;Vestibular    PT Next Visit Plan Repeat outcome measures, recert vs discharge. Progress strengthening and balance. Incorporate head turning activities into balance training given canal dehiscence surgery.    PT Home Exercise Plan As prescribed        Problem List Patient Active Problem List   Diagnosis Date Noted  . Vitamin D deficiency 08/20/2014  . Secondary hyperparathyroidism ([place]) 08/20/2014   Domingo Pulse, SPT This entire session was performed under direct supervision and direction of a licensed therapist/therapist assistant . I have personally read, edited and approve of the note as written. Gorden Harms. [name], PT, DPT [phone]  [name],[name] 10/22/2015, 3:44 PM  [place] MAIN [facility] SERVICES [address] [place], [place], [address] Phone: [phone]   Fax:  [phone]  Name: [name] MRN: [id number] Date of Birth: 03-Sep-1951

## 2015-10-22 NOTE — Therapy (Signed)
[place] [facility] [address] [place], [address] Phone: [phone]   Fax:  [phone]  Occupational Therapy Treatment  Patient Details  Name: [name] MRN: [id number] Date of Birth: 1951/08/23 Referring Provider: Para March  Encounter Date: 10/22/2015      OT End of Session - 10/22/15 1314    Visit Number 6   Number of Visits 8   Date for OT Re-Evaluation 10/31/15   OT Start Time 1133   OT Stop Time 1210   OT Time Calculation (min) 37 min   Activity Tolerance Patient tolerated treatment well   Behavior During Therapy [facility] for tasks assessed/performed      Past Medical History  Diagnosis Date  . Peptic ulcer     Past Surgical History  Procedure Laterality Date  . Mini gastric bypass    . Cholecystectomy    . Breast surgery    . Knee surgery    . Shoulder surgery      There were no vitals filed for this visit.  Visit Diagnosis:  Muscle weakness  Stiffness of finger joint of left hand  Stiffness of left wrist joint      Subjective Assessment - 10/22/15 1306    Subjective  Just come from PT this am - finishing up this week with them too- and then I want to go to the beach little - hand was better for about 24 hrs - could get my palms together   Patient Stated Goals Want to make fist again , and bend my wrist like before - want to do bathing and dressin easier, hold or lift object , cutting with knife, and cooking/house work    Currently in Pain? No/denies            [facility] OT Assessment - 10/22/15 0001    Left Hand AROM   L Index  MCP 0-90 --  improve from -15 to 0   L Index PIP 0-100 --  improve from -5 to 0   L Long  MCP 0-90 --  improve -15 to -8   L Long PIP 0-100 --  -20   L Ring  MCP 0-90 --  improve from -15 to 8   L Ring PIP 0-100 --  -20   L Little  MCP 0-90 --  improve from -10 to 0   L Little PIP 0-100 --  -10 improve to -5                  OT  Treatments/Exercises (OP) - 10/22/15 0001    Wrist Exercises   Other wrist exercises Wrist prayer stretch done at end for wrist extention and composite extention    Hand Exercises   Other Hand Exercises Digits extention improve after graston and able to get palms together - and ROM improve - Digits extention on putty - with strech with R hand behind PIP's    Other Hand Exercises Also to use putty for rolling  palm over ball of putty circular, up and down and lateral  at home    Manual Therapy   Manual therapy comments Soft tissue mobs done using Graston tools  for palm over thena and hypo thenar eminence -  and inbetween MC's and along volar digits - using tool 2- 4  and doing sweeping , scooping , brushing ,                 OT Education -  10/22/15 1313    Education provided Yes   Education Details HEP   Person(s) Educated Patient   Methods Explanation;Demonstration;Verbal cues;Tactile cues   Comprehension Returned demonstration;Verbalized understanding;Verbal cues required          OT Short Term Goals - 10/17/15 1638    OT SHORT TERM GOAL #1   Title Flexion of digtis improve for pt to touch palm to hold brush or knife   Baseline touching palm   Status Achieved   OT SHORT TERM GOAL #2   Title Wrist AROM improve with 5-15 degrees in all planes to be able to use L hand at least 50% of time in bathing , dressing , cooking    Baseline see flowsheet   Status Achieved           OT Long Term Goals - 10/17/15 1638    OT LONG TERM GOAL #8   Title Function on PRWHE improve at least with 10-15 points    Baseline need to assess PRWHE next time    Time 2   Period Weeks   Status On-going   OT LONG TERM GOAL  #9   Baseline Pt to show increase grip strenght in L to 50% compare to R to be able to hold pots, cut food    Time 2   Period Weeks   Status On-going               Plan - 10/22/15 1315    Clinical Impression Statement Pt made great progress in ROM and  strength in wrist- pt has trouble opening hand to grasp large objects and had some ittuse with that since previous fracture - but responding great to Graston tools for palm and digits to increase MC extention and palm to be able to lay flat -  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 and could not get 3rd and 4th  straigth - in line with dupuytrens    Pt will benefit from skilled therapeutic intervention in order to improve on the following deficits (Retired) Impaired flexibility;Decreased range of motion;Pain;Impaired UE functional use;Decreased strength   Rehab Potential Good   OT Frequency 2x / week   OT Duration 1 weeks   OT Treatment/Interventions Self-care/ADL training;Parrafin;Fluidtherapy;Moist Heat;Splinting;Patient/family education;Therapeutic exercises;Passive range of motion;Manual Therapy   Plan manual therapy - and increase digits extention and assess ROM and grip    OT Home Exercise Plan see pt instruction   Consulted and Agree with Plan of Care Patient        Problem List Patient Active Problem List   Diagnosis Date Noted  . Vitamin D deficiency 08/20/2014  . Secondary hyperparathyroidism ([place]) 08/20/2014    Rosalyn Gess OTR/L,CLT 10/22/2015, 1:18 PM  [place] [facility] [address] [place], [address] Phone: [phone]   Fax:  [phone]  Name: [name] MRN: [id number] Date of Birth: [birth year]

## 2015-10-22 NOTE — Patient Instructions (Signed)
Same as before but add some manual she can do with her putty

## 2015-10-24 ENCOUNTER — Ambulatory Visit: Payer: 59

## 2015-10-24 ENCOUNTER — Ambulatory Visit: Payer: 59 | Admitting: Occupational Therapy

## 2015-10-24 DIAGNOSIS — M6281 Muscle weakness (generalized): Secondary | ICD-10-CM

## 2015-10-24 DIAGNOSIS — R2681 Unsteadiness on feet: Secondary | ICD-10-CM | POA: Diagnosis not present

## 2015-10-24 DIAGNOSIS — M79632 Pain in left forearm: Secondary | ICD-10-CM

## 2015-10-24 DIAGNOSIS — M25642 Stiffness of left hand, not elsewhere classified: Secondary | ICD-10-CM

## 2015-10-24 DIAGNOSIS — M25632 Stiffness of left wrist, not elsewhere classified: Secondary | ICD-10-CM

## 2015-10-24 NOTE — Patient Instructions (Signed)
Cont with Wrist flexion more than extention  Increase strength  Grip strength  Cont with using putty  Massage of palm and digits followed by putty to maintain gains

## 2015-10-24 NOTE — Therapy (Signed)
[place] MAIN [facility] SERVICES [address] [place], [place], [address] Phone: [phone]   Fax:  [phone]  Physical Therapy Treatment/ Discharge Note  Patient Details  Name: [name] MRN: [id number] Date of Birth: 1950/11/10 Referring Provider: Buddy Duty  Encounter Date: 10/24/2015      PT End of Session - 10/24/15 1112    Visit Number 12   Number of Visits 17   Date for PT Re-Evaluation 10/31/15   PT Start Time 0848   PT Stop Time 0934   PT Time Calculation (min) 46 min   Equipment Utilized During Treatment Gait belt   Activity Tolerance Patient tolerated treatment well   Behavior During Therapy [facility] for tasks assessed/performed      Past Medical History  Diagnosis Date  . Peptic ulcer     Past Surgical History  Procedure Laterality Date  . Mini gastric bypass    . Cholecystectomy    . Breast surgery    . Knee surgery    . Shoulder surgery      There were no vitals filed for this visit.  Visit Diagnosis:  Muscle weakness  Unsteadiness on feet      Subjective Assessment - 10/24/15 1106    Subjective pt reports less of a right pull sensation today than last treatment. She reports her PCP did not call her following PT call to PCP regarding concern for R sided weakness last visit. pt reports no pain and states she feels like her back is much stronger.   Currently in Pain? No/denies   Pain Score 0-No pain      Neuromuscular re ed:  Balance Master training: sensory integration testing x 30 min see scanned document for details Composite score was 25 (was 31 previously) AP and lateral weight shifts with eyes closed on airex in // bars 30 sec x 3 sets pt required CGA for safety Forward and retro tandem walking in // bars x 4 laps pt required CGA for safety          PT Education - 10/24/15 1111    Education provided Yes   Education Details discharge HEP   Person(s) Educated Patient   Methods  Explanation;Demonstration;Verbal cues;Handout   Comprehension Verbalized understanding;Returned demonstration             PT Long Term Goals - 10/24/15 1336    PT LONG TERM GOAL #1   Title pt will improve berg balance score to >46/56 to reduce fall risk   Time 8   Period Weeks   Status Achieved   PT LONG TERM GOAL #2   Title pt will imporve 79mwalk speed to 1.253m for community mobility    Baseline 1.5m31m  Time 8   Period Weeks   Status Partially Met   PT LONG TERM GOAL #3   Title pt will improve 5x sit to stand to under 25s showimg improved leg strength.    Time 4   Period Weeks   Status Achieved   PT LONG TERM GOAL #4   Title pt will redcue TUG tim to <12s reducing fall risk   Time 8   Period Weeks   Status Achieved   PT LONG TERM GOAL #5   Title pt will improve DGI score to >19 to reduce fall risk   Time 4   Period Weeks   Status Achieved               Plan - 10/24/15  1113    Clinical Impression Statement pt demonstrates some decline in balancing activities as demonstrated by balance master likely due prior to imbalance episode carrying over. pt has apppointment with doctor to discuss balance defficits and balance master results. pt demonstrated progression of goals with improved DGI score of 19 putting her at a lower fall risk, and 74mwalk time of 1.1 m/s and pt has improved confidence in balance. pt was discharged as per pt request and was given progressive HEP  of static and dynamic balance activities, she has made signficant progress in strength, mobility and balance since the start of PT.    Pt will benefit from skilled therapeutic intervention in order to improve on the following deficits Decreased activity tolerance;Decreased balance;Decreased mobility;Dizziness;Difficulty walking;Decreased strength;Increased edema;Pain   Rehab Potential Good   PT Frequency 2x / week   PT Duration 8 weeks   PT Treatment/Interventions Aquatic Therapy;Neuromuscular  re-education;Balance training;Therapeutic exercise;Therapeutic activities;Functional mobility training;Gait training;Stair training;Vestibular   PT Next Visit Plan Repeat outcome measures, recert vs discharge. Progress strengthening and balance. Incorporate head turning activities into balance training given canal dehiscence surgery.    PT Home Exercise Plan As prescribed        Problem List Patient Active Problem List   Diagnosis Date Noted  . Vitamin D deficiency 08/20/2014  . Secondary hyperparathyroidism ([place] 08/20/2014   [place] P[place] SPT This entire session was performed under direct supervision and direction of a licensed therapist/therapist assistant . I have personally read, edited and approve of the note as written. AGorden Harms [name], PT, DPT #[id number] [name],[name] 10/24/2015, 1:36 PM  [place] [facility] [address] [address]Phone: [phone]  Fax:  [phone] Name: Tiffany A JMartiniqueMRN: [id number] of Birth: [birth year]

## 2015-10-24 NOTE — Therapy (Signed)
[place] [facility] [address] [place], [address] Phone: [phone]   Fax:  [phone]  Occupational Therapy Treatment  Patient Details  Name: [name] MRN: [id number] Date of Birth: Dec 11, 1950 Referring Provider: Para March  Encounter Date: 10/24/2015      OT End of Session - 10/24/15 1406    Visit Number 7   Number of Visits 7   Date for OT Re-Evaluation 10/24/15   OT Start Time 1001   OT Stop Time 1046   OT Time Calculation (min) 45 min   Activity Tolerance Patient tolerated treatment well   Behavior During Therapy [facility] for tasks assessed/performed      Past Medical History  Diagnosis Date  . Peptic ulcer     Past Surgical History  Procedure Laterality Date  . Mini gastric bypass    . Cholecystectomy    . Breast surgery    . Knee surgery    . Shoulder surgery      There were no vitals filed for this visit.  Visit Diagnosis:  Muscle weakness  Stiffness of finger joint of left hand  Stiffness of left wrist joint  Pain of left forearm      Subjective Assessment - 10/24/15 1402    Subjective  I want you to show me how I can do that massage to my hand - that is the first time my palms touched each other and fingers was more straight - I can do anything except grasp something that requires me to open thumb wide , and  something more than 5 lbs picking up    Patient Stated Goals Want to make fist again , and bend my wrist like before - want to do bathing and dressin easier, hold or lift object , cutting with knife, and cooking/house work    Currently in Pain? No/denies            [facility] OT Assessment - 10/24/15 0001    AROM   Right Forearm Supination 90 Degrees   Left Forearm Supination 90 Degrees   Right Wrist Extension 64 Degrees   Right Wrist Flexion 85 Degrees   Right Wrist Radial Deviation 16 Degrees   Right Wrist Ulnar Deviation 32 Degrees   Left Wrist Extension 64 Degrees   Left Wrist Flexion 63 Degrees   Left Wrist Radial Deviation 15 Degrees   Left Wrist Ulnar Deviation 30 Degrees   Strength   Right Hand Grip (lbs) 30   Right Hand Lateral Pinch 15 lbs   Right Hand 3 Point Pinch 12 lbs   Left Hand Grip (lbs) 24   Left Hand Lateral Pinch 11 lbs   Left Hand 3 Point Pinch 10 lbs                  OT Treatments/Exercises (OP) - 10/24/15 0001    Wrist Exercises   Other wrist exercises Measurements taken for ROM and grip    Hand Exercises   Other Hand Exercises ROM taken and rolling of putty and digits extentio ndone after manual to maintain extention     LUE Paraffin   Number Minutes Paraffin 10 Minutes   LUE Paraffin Location Hand   Manual Therapy   Manual therapy comments Soft tissue mobs done using Graston tools  for palm over thena and hypo thenar eminence -  and inbetween MC's and along volar digits - using tool 2- 4  and doing sweeping , scooping , brushing ,  OT Education - 10/24/15 1406    Education provided Yes   Education Details HEP and discharge instruction    Person(s) Educated Patient   Methods Explanation;Demonstration;Tactile cues   Comprehension Verbalized understanding;Returned demonstration          OT Short Term Goals - 10/24/15 1408    OT SHORT TERM GOAL #1   Title Flexion of digtis improve for pt to touch palm to hold brush or knife   Status Achieved   OT SHORT TERM GOAL #2   Title Wrist AROM improve with 5-15 degrees in all planes to be able to use L hand at least 50% of time in bathing , dressing , cooking    Status Achieved           OT Long Term Goals - 10/24/15 1409    OT LONG TERM GOAL #8   Title Function on PRWHE improve at least with 10-15 points    Baseline PRWHE now 18.5/50    Status Achieved   OT LONG TERM GOAL  #9   Baseline Pt to show increase grip strenght in L to 50% compare to R to be able to hold pots, cut food    Status Achieved               Plan -  10/24/15 1407    Clinical Impression Statement Pt made great progress in wrist ROM , grip and prehension - pain and increase use of Lhand - pt had not full digits extention since previous fracture but with Graston tools after parafin - gained good ROM in digits - with 2nd and 5th maintain gains -but 2nd and 3rd PIP still impaired but 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 Vitamin D deficiency 08/20/2014  . Secondary hyperparathyroidism ([place]) 08/20/2014    Rosalyn Gess OTR/L,CLT 10/24/2015, 2:11 PM  [place] [facility] [address] [place], [address] Phone: [phone]   Fax:  [phone]  Name: [name] MRN: [id number] Date of Birth: 22-Mar-1951

## 2015-10-29 ENCOUNTER — Ambulatory Visit: Payer: 59

## 2015-11-07 DIAGNOSIS — H6901 Patulous Eustachian tube, right ear: Secondary | ICD-10-CM | POA: Insufficient documentation

## 2015-11-07 DIAGNOSIS — H838X3 Other specified diseases of inner ear, bilateral: Secondary | ICD-10-CM | POA: Insufficient documentation

## 2015-11-08 ENCOUNTER — Ambulatory Visit: Payer: 59 | Admitting: Occupational Therapy

## 2015-11-08 DIAGNOSIS — I890 Lymphedema, not elsewhere classified: Secondary | ICD-10-CM

## 2015-11-08 DIAGNOSIS — R2681 Unsteadiness on feet: Secondary | ICD-10-CM | POA: Diagnosis not present

## 2015-11-08 NOTE — Therapy (Signed)
[place] MAIN [facility] SERVICES [address] [place], [place], [address] Phone: [phone]   Fax:  [phone]  Occupational Therapy Treatment  Patient Details  Name: [name] MRN: [id number] Date of Birth: 07/03/1951 Referring Provider: Para March  Encounter Date: 11/08/2015      OT End of Session - 11/08/15 1310    Visit Number 8   Number of Visits 8   Date for OT Re-Evaluation 10/24/15      Past Medical History  Diagnosis Date  . Peptic ulcer     Past Surgical History  Procedure Laterality Date  . Mini gastric bypass    . Cholecystectomy    . Breast surgery    . Knee surgery    . Shoulder surgery      There were no vitals filed for this visit.  Visit Diagnosis:  Lymphedema      Subjective Assessment - 11/08/15 1308    Subjective  Pt  here today for assessment of new custom compression garments/ devices fit and function. Fitting was completed by vendor 2 days ago.    Patient is accompained by: Family member   Pertinent History osteporosis; s/p gastric bypass 2003 ( s/p ~250# wt loss);  strong + family hx for BLE lymphedema- mother and maternal grandmother; BLE swelling onset early adulthood; frequent falls ( 3-4 in last month with frequent loss of balance   Limitations difficlty walking, standing, lower body dressing , fitting LB clothing and street shoes, transfers, bed moility, bathing, performing hygine, falls, limits participation in home management and productive work/ volunterr activities, limits ability to participate in  sociall family and travel activivities   Patient Stated Goals Want to make fist again , and bend my wrist like before - want to do bathing and dressin easier, hold or lift object , cutting with knife, and cooking/house work    Currently in Pain? No/denies   Pain Onset Yesterday                              OT Education - 11/08/15 1309    Education provided Yes   Education  Details Emphasis of skilled LE self-care training today on compression garment/ device proper fit and function,  wear and care regimes, and donning and doffing using assistive devices.   Person(s) Educated Patient   Methods Explanation;Demonstration;Tactile cues;Verbal cues;Handout   Comprehension Verbalized understanding;Returned demonstration;Verbal cues required;Tactile cues required          OT Short Term Goals - 10/24/15 1408    OT SHORT TERM GOAL #1   Title Flexion of digtis improve for pt to touch palm to hold brush or knife   Status Achieved   OT SHORT TERM GOAL #2   Title Wrist AROM improve with 5-15 degrees in all planes to be able to use L hand at least 50% of time in bathing , dressing , cooking    Status Achieved           OT Long Term Goals - 10/24/15 1409    OT LONG TERM GOAL #8   Title Function on PRWHE improve at least with 10-15 points    Baseline PRWHE now 18.5/50    Status Achieved   OT LONG TERM GOAL  #9   Baseline Pt to show increase grip strenght in L to 50% compare to R to be able to hold pots, cut food    Status Achieved  Plan - 11/08/15 1310    Clinical Impression Statement By assessment BLE thigh length compression garments ( ccl 3 flat knit Elvarex and ccl 1 toe caps) are both ~ 5 cm too long from knee to top edge. Emailed photods to DME vendor and requested remakes. Toe caps appear to fit and function well. Pt reports some mild difficulty tolerating stockings for more than 4 hours , and she reports she has not tolerated toe caps at all since fitting. Adter Pt edu for strategies for building tolerance of garments, Pt agrees to attempt to by extend wear a little each day  until she is able to tolerate full time during waking hours. By end of session Pt is able to don stockings and toe caps using assistive devices ( friction mat, gripper gloves, tyvek  sock). Pt agrees to wash and wear over the weekend and contact vendor with any other  issues that may come up. OT emailed phots and problem list to vendor requesting remakes bilaterally ASAP.   Pt will benefit from skilled therapeutic intervention in order to improve on the following deficits (Retired) Impaired flexibility;Decreased range of motion;Pain;Impaired UE functional use;Decreased strength   Rehab Potential Good   OT Frequency Other (comment)  return for follow up garment assessment after remakes delivered, and PRN   OT Treatment/Interventions Self-care/ADL training;Parrafin;Fluidtherapy;Moist Heat;Splinting;Patient/family education;Therapeutic exercises;Passive range of motion;Manual Therapy   OT Home Exercise Plan see pt instruction   Consulted and Agree with Plan of Care Patient        Problem List Patient Active Problem List   Diagnosis Date Noted  . Vitamin D deficiency 08/20/2014  . Secondary hyperparathyroidism ([place]) 08/20/2014    Andrey Spearman, MS, OTR/L, [facility] 11/08/2015 1:30 PM   [place] MAIN [facility] SERVICES [address] [place], [place], [address] Phone: [phone]   Fax:  [phone]  Name: [name] MRN: [id number] Date of Birth: 03/28/1951

## 2015-11-09 ENCOUNTER — Encounter (HOSPITAL_COMMUNITY): Payer: Self-pay | Admitting: *Deleted

## 2015-11-09 ENCOUNTER — Emergency Department (HOSPITAL_COMMUNITY)
Admission: EM | Admit: 2015-11-09 | Discharge: 2015-11-09 | Disposition: A | Discharge status: Home / Self Care | Payer: 59 | Source: Home / Self Care | Attending: Emergency Medicine | Admitting: Emergency Medicine

## 2015-11-09 DIAGNOSIS — T63304A Toxic effect of unspecified spider venom, undetermined, initial encounter: Secondary | ICD-10-CM

## 2015-11-09 MED ORDER — CEPHALEXIN 500 MG PO CAPS: 500.0000 mg | ORAL_CAPSULE | Freq: Three times a day (TID) | ORAL | Status: DC

## 2015-11-09 NOTE — ED Notes (Signed)
C/O waking with irritated, pruritic right forearm this AM; noticed some redness.  Throughout the day, area of redness has increased, with swelling & soreness, and small central darkened lesion that "is becoming more pronounced".

## 2015-11-09 NOTE — Discharge Instructions (Signed)
Cellulitis °Cellulitis is an infection of the skin and the tissue under the skin. The infected area is usually red and tender. This happens most often in the arms and lower legs. °HOME CARE  °· Take your antibiotic medicine as told. Finish the medicine even if you start to feel better. °· Keep the infected arm or leg raised (elevated). °· Put a warm cloth on the area up to 4 times per day. °· Only take medicines as told by your doctor. °· Keep all doctor visits as told. °GET HELP IF: °· You see red streaks on the skin coming from the infected area. °· Your red area gets bigger or turns a dark color. °· Your bone or joint under the infected area is painful after the skin heals. °· Your infection comes back in the same area or different area. °· You have a puffy (swollen) bump in the infected area. °· You have new symptoms. °· You have a fever. °GET HELP RIGHT AWAY IF:  °· You feel very sleepy. °· You throw up (vomit) or have watery poop (diarrhea). °· You feel sick and have muscle aches and pains. °  °This information is not intended to replace advice given to you by your health care provider. Make sure you discuss any questions you have with your health care provider. °  °Document Released: 02/17/2008 Document Revised: 05/22/2015 Document Reviewed: 11/16/2011 °Elsevier Interactive Patient Education ©2016 [employer]vier Inc[employer] ° °

## 2015-11-09 NOTE — ED Provider Notes (Signed)
CSN: [id number]     Arrival date & time 11/09/15  1940 History   First MD Initiated Contact with Patient 11/09/15 2011     Chief Complaint  Patient presents with  . Edema   (Consider location/radiation/quality/duration/timing/severity/associated sxs/prior Treatment) HPI History obtained from patient:   LOCATION:right forearm SEVERITY:3 DURATION:today CONTEXT:noted while taking shower QUALITY:itch, burning MODIFYING FACTORS:cortisone and alcohol ASSOCIATED SYMPTOMS:redness and swelling TIMING:constant Worried that she has a brown recluse spider bite  Past Medical History  Diagnosis Date  . Peptic ulcer    Past Surgical History  Procedure Laterality Date  . Mini gastric bypass    . Cholecystectomy    . Breast surgery    . Knee surgery    . Shoulder surgery     Family History  Problem Relation Age of Onset  . Cancer Other   . Hypertension Other   . Stroke Other   . Diabetes Other   . Heart attack Other   . Obesity Other    Social History  Substance Use Topics  . Smoking status: Never Smoker   . Smokeless tobacco: None  . Alcohol Use: No   OB History    No data available     Review of Systems Redness, swelling right forearm Allergies  Lac bovis; Prednisone; Sulfa antibiotics; and Zithromax  Home Medications   Prior to Admission medications   Medication Sig Start Date End Date Taking? Authorizing Provider  calcitRIOL (ROCALTROL) 0.5 MCG capsule Take 0.5 mcg by mouth daily.   Yes Historical Provider, MD  Calcium Citrate 250 MG TABS Take 2 tabs with lunch and 2 tabs with dinner 08/15/14  Yes Philemon Kingdom, MD  Cholecalciferol (VITAMIN D3) 5000 UNITS CAPS Take 2 capsules by mouth daily.   Yes Historical Provider, MD  Multiple Vitamin (MULTIVITAMIN) tablet Take 1 tablet by mouth 3 (three) times daily.   Yes Historical Provider, MD  Sucralfate (CARAFATE PO) Take by mouth.   Yes Historical Provider, MD  cephALEXin (KEFLEX) 500 MG capsule Take 1 capsule (500 mg  total) by mouth 3 (three) times daily. 11/09/15   Konrad Felix, PA  diphenhydrAMINE (BENADRYL) 25 mg capsule Take 1 capsule (25 mg total) by mouth every 6 (six) hours as needed for itching. 12/12/14   Varney Biles, MD  famotidine (PEPCID) 20 MG tablet Take 1 tablet (20 mg total) by mouth 2 (two) times daily. 12/12/14   Varney Biles, MD  furosemide (LASIX) 40 MG tablet Take 40 mg by mouth daily. 1/2 tablet at lunch    Historical Provider, MD  Iron, Ferrous Gluconate, 256 (28 FE) MG TABS Take by mouth.    Historical Provider, MD  magnesium oxide (MAG-OX) 400 MG tablet Take 400 mg by mouth daily.    Historical Provider, MD  ondansetron (ZOFRAN) 8 MG tablet Take 8 mg by mouth every 8 (eight) hours as needed for nausea or vomiting.    Historical Provider, MD  permethrin (ELIMITE) 5 % cream Apply to affected area once 12/12/14   Varney Biles, MD  potassium chloride (KLOR-CON) 20 MEQ packet Take 20 mEq by mouth 2 (two) times daily.    Historical Provider, MD   Meds Ordered and Administered this Visit  Medications - No data to display  BP 118/77 mmHg  Pulse 86  Temp(Src) 97.7 F (36.5 C) (Oral)  SpO2 96% No data found.   Physical Exam  Constitutional: She appears well-developed and well-nourished.  Musculoskeletal: She exhibits tenderness.       Arms: Nursing note  and vitals reviewed.   ED Course  Procedures (including critical care time)  Labs Review Labs Reviewed - No data to display  Imaging Review No results found.   Visual Acuity Review  Right Eye Distance:   Left Eye Distance:   Bilateral Distance:    Right Eye Near:   Left Eye Near:    Bilateral Near:         MDM   1. Spider bite, undetermined intent, initial encounter    Patient is reassured that there are no issues that require transfer to higher level of care at this time.  Patient is advised to continue home symptomatic treatment. Prescription is sent to  pharmacy patient has indicated. ( Cephalexin )   Patient is advised that if there are new or worsening symptoms or attend the emergency department, or contact primary care provider. Instructions of care provided discharged home in stable condition. Return to work/school note provided.  THIS NOTE WAS GENERATED USING A VOICE RECOGNITION SOFTWARE PROGRAM. ALL REASONABLE EFFORTS  WERE MADE TO PROOFREAD THIS DOCUMENT FOR ACCURACY.     Konrad Felix, [place] 11/09/15 2043

## 2015-11-25 ENCOUNTER — Ambulatory Visit: Payer: 59 | Attending: Internal Medicine | Admitting: Occupational Therapy

## 2015-11-25 DIAGNOSIS — I890 Lymphedema, not elsewhere classified: Secondary | ICD-10-CM

## 2015-11-25 NOTE — Therapy (Signed)
[place] MAIN [facility] SERVICES [address] [place], [place], [address] Phone: [phone]   Fax:  [phone]  Occupational Therapy Treatment  Patient Details  Name: [name] MRN: [id number] Date of Birth: [birth year] Referring Provider: Para March  Encounter Date: 11/25/2015      OT End of Session - 11/25/15 [id number]    Visit Number 9   Number of Visits 36   Date for OT Re-Evaluation 10/24/15      Past Medical History  Diagnosis Date  . Peptic ulcer     Past Surgical History  Procedure Laterality Date  . Mini gastric bypass    . Cholecystectomy    . Breast surgery    . Knee surgery    . Shoulder surgery      There were no vitals filed for this visit.  Visit Diagnosis:  Lymphedema      Subjective Assessment - 11/25/15 1629    Subjective  Pt  here today for assessment and remeasurement of of new custom compression garments with manufacturer's rep.   Patient is accompained by: Family member   Pertinent History osteporosis; s/p gastric bypass 2003 ( s/p ~250# wt loss);  strong + family hx for BLE lymphedema- mother and maternal grandmother; BLE swelling onset early adulthood; frequent falls ( 3-4 in last month with frequent loss of balance   Limitations difficlty walking, standing, lower body dressing , fitting LB clothing and street shoes, transfers, bed moility, bathing, performing hygine, falls, limits participation in home management and productive work/ volunterr activities, limits ability to participate in  sociall family and travel activivities   Patient Stated Goals Want to make fist again , and bend my wrist like before - want to do bathing and dressin easier, hold or lift object , cutting with knife, and cooking/house work    Currently in Pain? No/denies   Pain Onset Yesterday                      OT Treatments/Exercises (OP) - 11/25/15 0001    Manual Therapy   Manual Therapy Edema management   Manual  therapy comments Anatomical measurements of BLE for custom compression garments- remakes w manufacturer's rep   Compression Bandaging Pt declines compression after session                OT Education - 11/25/15 1632    Education provided No          OT Short Term Goals - 10/24/15 1408    OT SHORT TERM GOAL #1   Title Flexion of digtis improve for pt to touch palm to hold brush or knife   Status Achieved   OT SHORT TERM GOAL #2   Title Wrist AROM improve with 5-15 degrees in all planes to be able to use L hand at least 50% of time in bathing , dressing , cooking    Status Achieved           OT Long Term Goals - 10/24/15 1409    OT LONG TERM GOAL #8   Title Function on PRWHE improve at least with 10-15 points    Baseline PRWHE now 18.5/50    Status Achieved   OT LONG TERM GOAL  #9   Baseline Pt to show increase grip strenght in L to 50% compare to R to be able to hold pots, cut food    Status Achieved  Plan - 11/25/15 1632    Clinical Impression Statement  All stocking measured repeated bilaterally. Changes incllude tighter measurements at thighs and decreased lengths from top edge bilaterally. Pt managing between sessions well,despite difficulty tolerating garments. She is utilizing  pump daily. Will fit remakes ASAP then follow up   Pt will benefit from skilled therapeutic intervention in order to improve on the following deficits (Retired) Impaired flexibility;Decreased range of motion;Pain;Impaired UE functional use;Decreased strength   Rehab Potential Good   OT Frequency Other (comment)  return for follow up garment assessment after remakes delivered, and PRN   OT Treatment/Interventions Self-care/ADL training;Parrafin;Fluidtherapy;Moist Heat;Splinting;Patient/family education;Therapeutic exercises;Passive range of motion;Manual Therapy   OT Home Exercise Plan see pt instruction   Consulted and Agree with Plan of Care Patient         Problem List Patient Active Problem List   Diagnosis Date Noted  . Vitamin D deficiency 08/20/2014  . Secondary hyperparathyroidism ([place]) 08/20/2014    Andrey Spearman, MS, OTR/L, [facility] 11/25/2015 4:40 PM   [place] MAIN [facility] SERVICES [address] [place], [place], [address] Phone: [phone]   Fax:  [phone]  Name: [name] MRN: [id number] Date of Birth: November 17, 1950

## 2015-11-25 NOTE — Patient Instructions (Signed)
LE instructions and precautions as established- see initial eval.   

## 2015-12-16 ENCOUNTER — Ambulatory Visit: Payer: 59 | Admitting: Occupational Therapy

## 2015-12-17 ENCOUNTER — Encounter: Payer: 59 | Attending: Internal Medicine | Admitting: Dietician

## 2015-12-17 ENCOUNTER — Encounter: Payer: Self-pay | Admitting: Dietician

## 2015-12-17 DIAGNOSIS — M810 Age-related osteoporosis without current pathological fracture: Secondary | ICD-10-CM | POA: Diagnosis present

## 2015-12-17 DIAGNOSIS — R634 Abnormal weight loss: Secondary | ICD-10-CM

## 2015-12-17 NOTE — Progress Notes (Signed)
  Medical Nutrition Therapy:  Appt start time: 925 end time: 1005   Follow up:  Primary concerns today: [name] returns today having maintained her weight of 142 lbs in the last year. She reports that she is "still battling" with her health. She states that her ulcer has healed. Had a bad fall about 6 months ago and broke her back in 4 places. She has been healing physically but still struggles with dizziness. Was told that her eustachian tube stays open, likely due to rapid weight loss. She reports that her vomiting has resolved since her ulcer healed. Continues to struggle with loose bowels. She tries to avoid foods that trigger diarrhea. Patient also notices that when she is upset or worried she has diarrhea. Tolerating all meats well and includes protein with each meal and snack. Beginning to have "shaky spells" in the morning between breakfast and lunch and she states that orange juice helps. Does not wake up in the night or in the morning feeling shaky. Feeling like weight is no longer an issue. States that she is getting her energy back. Found out that her lower extremity swelling was due to lymphedema. Started wearing compression stockings and partitcipating in lymph massage.Taking multivitamin, calcium citrate, and vitamin D.   Preferred Learning Style:   No preference indicated   Learning Readiness:   Ready  MEDICATIONS: see list; Carafate and Nexium added   DIETARY INTAKE:  Usual eating pattern includes 3 meals and 2-3 snacks per day.  Avoided foods include green vegetables/salads, milk products, spicy food.    24-hr recall:  B ( AM): sausage, egg, and cheese McMuffin, 1/2 cup coffee OR 2 eggs, breakfast meat, toast with orange juice or coffee Snk ( AM): sometimes juice; nuts or fruit L ( PM): 1/2 hamburger or [employer] meal Snk (3:30 PM): rest of lunch D ( PM): 3 oz meat, potato or macaroni and cheese, baked beans Snk (9 PM): sometimes 2 beef hotdogs  Beverages: mostly water,  water with artificial sweetener, occasionally sweet tea, orange juice  Usual physical activity: ADLs, takes naps  Estimated energy needs: 1000-1300 calories 60-90g protein   Progress Towards Goal(s):  In progress.   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  As evidenced by osteoporosis and frequent loose stools.    Intervention:  Nutrition counseling provided.  Goals: -Take vitamin and calcium at least 2 hours apart  -Take calcium and vitamin D supplements together  -Have orange juice on hand mid-morning for blood sugar maintenance -Increase calcium-containing foods  -Cheese, almonds, shellfish, fortified cream of wheat, fortified orange juice, cottage cheese  Handouts provided: Calcium content of foods  Teaching Method Utilized:  Auditory  Barriers to learning/adherence to lifestyle change: chronic nausea and fatigue  Demonstrated degree of understanding via:  Teach Back   Monitoring/Evaluation:  Dietary intake, exercise, labs, and body weight in 6 month(s).

## 2015-12-17 NOTE — Patient Instructions (Addendum)
-  Take vitamin and calcium at least 2 hours apart  -Take calcium and vitamin D supplements together  -Have orange juice on hand mid-morning for blood sugar maintenance -Increase calcium-containing foods  -Cheese, almonds, shellfish, fortified cream of wheat, fortified orange juice, cottage cheese

## 2016-06-17 ENCOUNTER — Ambulatory Visit: Payer: 59 | Admitting: Dietician

## 2016-07-15 ENCOUNTER — Ambulatory Visit: Payer: 59 | Attending: Family Medicine | Admitting: Occupational Therapy

## 2016-07-15 DIAGNOSIS — I890 Lymphedema, not elsewhere classified: Secondary | ICD-10-CM | POA: Insufficient documentation

## 2016-07-16 ENCOUNTER — Encounter: Payer: Self-pay | Admitting: Occupational Therapy

## 2016-07-16 NOTE — Patient Instructions (Signed)

## 2016-07-17 NOTE — Therapy (Signed)
[place] MAIN [facility] SERVICES [address] [place], [place], [address] Phone: [phone]   Fax:  [phone]  Occupational Therapy Evaluation Lipo-lymphedema Episode II  Patient Details  Name: [name] MRN: [id number] Date of Birth: 1951/01/06 Referring Provider: Maury Dus, MD  Encounter Date: 07/15/2016      OT End of Session - 07/16/16 1004    Visit Number 1   Number of Visits 36   Date for OT Re-Evaluation 10/13/16   OT Start Time 1106   OT Stop Time 1215   OT Time Calculation (min) 69 min      Past Medical History:  Diagnosis Date  . Peptic ulcer     Past Surgical History:  Procedure Laterality Date  . BREAST SURGERY    . CHOLECYSTECTOMY    . KNEE SURGERY    . mini gastric bypass    . SHOULDER SURGERY      There were no vitals filed for this visit.      Subjective Assessment - 07/17/16 0829    Subjective  Pt returns for new Occupational Therapy evaluation for BLE Lipo-lymphedema. She is referred by Maury Dus, MD. Pt is well known to this therapist. Vernia Buff lst seen in 11/2015 after sucessfully completing Intensive Phase Complete Decongestive Therapy (CDT).  She met volumetric reduction goals, was fitted with appropriate BLE compression garments and HOS devices, and continues to use a Flexitouch sequential pneumatic compression pump on alternating legs on a regular basis. Pt returns today after a fall  which exacerbated LE and severely limits her ability to perform LE self care. With recent L humeral head fracture she is currenty unable to apply compression wraps and the Flexitouch garment, to perform simple self MLD, skin care and therapeutic lymphatic pumping exercises, the components of Management Phase CDT. She also reports that she has been unable to don bilateral thigh length compression garments for sometime, even with assistance, due to limited use of her hands and decreased UE strength. Pt is accompanied today  by her husband, Richardson Landry, who plans to assist her w/ LE self care while she is disabled by her UE fracture.   Patient is accompained by: Family member   Pertinent History BLE lipo-lymphedema. 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 OT Assessment - 07/17/16 0001      Assessment   Diagnosis Moderate, stage 2, BLE/BLW lipo-lymphedema   Referring Provider Maury Dus, MD   Onset Date 07/17/16   Prior Therapy yes- Intensive Phase CDT to BLE with subsequent Management Phase support     Precautions   Precautions Other (comment)  Standard LE precautions, Falls   Required Braces or Orthoses Other Brace/Splint  LUE sling- full time     Balance Screen   Has the patient fallen in  the past 6 months Yes   How many times? 2   Has the patient had a decrease in activity level because of a fear of falling?  Yes   Is the patient reluctant to leave their home because of a fear of falling?  No     Home  Environment   Family/patient expects to be discharged to: Private residence   Living Arrangements Spouse/significant other   Available Help at Discharge Family   Type of [place] Shower/Tub [place] - single point   Additional Comments Pt and  spouse are in the process of selling their home. They plan to reside [place] house while [place] a new home locally   Lives With Spouse     Prior Function   Level of Independence Independent with basic ADLs;Independent with household mobility without device;Independent with community mobility with device;Independent with transfers;Needs assistance with homemaking;Needs assistance with gait   Vocation Retired     IADL   Prior Level of Airline pilot for transportation;Needs to be accompanied on any shopping trip   Light Housekeeping Does personal laundry completely;Performs light daily tasks such as dishwashing, bed making;Needs help with all home maintenance tasks   Meal Prep Plans, prepares and serves adequate meals independently   Community Mobility Relies on family or friends for transportation   Medication Management Is responsible for taking medication in correct dosages at correct time     Mobility   Mobility Status History of falls   Mobility Status Comments LE weakness, decreased balance, body assymetry 2/2 swelling, and Hx vestibular problems contribute to high falls risk     Written Expression   Dominant Hand Right     Activity Tolerance   Activity Tolerance Comments decreased standing and walking tolerance 2/2 BLE/BLQ pain, weakness and swelling     Cognition   Overall Cognitive Status Within Functional Limits for tasks assessed     Observation/Other Assessments   Observations BLE present w/ markedly increased swelling and tissue density since last seen in 11/2015. Legs below the knees are dense with non-pitting lymphatic congestion and fatty fibrosis in "pantaloon" configuration typical of lipo-lymphedema. Pt complaining of new genital lymphedema. Swelling over pubic bone is clearly visible . Stemmer sign is negative bilaterally.   Skin Integrity Skin is tight below the knees w/ "doughy" thighs. Skin is mildly dry without  signs/symptoms of infection.   Focus on Therapeutic Outcomes (FOTO)  Pt and spouse edu for compression wrapping, MLD, alternative, adjustable daytime compression garments, fall prevention education, volumetric reduction to improve body symmetry and balance,     Sensation   Light Touch Appears Intact          LYMPHEDEMA/ONCOLOGY QUESTIONNAIRE - 07/17/16 0856      What other symptoms do you have   Are you Having Heaviness or Tightness Yes   Are you having pitting edema No   Is it Hard or Difficult finding clothes that fit Yes   Do you have infections No   Is there Decreased scar mobility No   Stemmer Sign No     Lymphedema Stage   Stage STAGE 2 SPONTANEOUSLY IRREVERSIBLE     Lymphedema Assessments   Lymphedema Assessments Lower extremities     Right Lower Extremity Lymphedema   Other BLE comparative limb volumetrics TBA at first Rx visit  OT Treatments/Exercises (OP) - 07/17/16 0001      Transfers   Transfers Sit to Stand;Stand to Sit   Sit to Stand 3: Mod assist   Stand to Sit 5: Supervision     ADLs   Overall ADLs Needs mod -max assistance with all self care and basic and instrumental ADLs at present except feeding     Manual Therapy   Manual Therapy Edema management;Manual Lymphatic Drainage (MLD);Compression Bandaging;Other (comment)  Skin care               OT Education - 07/16/16 1003    Education provided Yes   Education Details Provided Pt/caregiver skilled education and ADL training throughout visit for lymphedema etiology, progression, and treatment including Intensive and Management Phase Complete Decongestive Therapy (CDT)  Discussed lymphedema precautions, cellulitis risk, and all CDT and LE self-care components, including compression wrapping/ garments & devices, lymphatic pumping ther ex, simple self-MLD, and skin care. Provided printed Lymphedema Workbook for reference.   Person(s) Educated Patient;Spouse   Methods  Explanation;Demonstration;Tactile cues;Verbal cues;Handout   Comprehension Verbalized understanding;Need further instruction             OT Long Term Goals - 07/17/16 0859      OT LONG TERM GOAL #1   Title Pt able to correctly apply gradient compression wraps from toes to groiin with Max assistance from caregiver within 2 weeks for optimal limb volume reduction and LE self management over time.- Pt is independent w/ compression wraps. No CG assistance required.   Baseline dependent   Time 2   Period Weeks   Status New     OT LONG TERM GOAL #2   Title Pt to achieve 10% limb volume reductions bilaterally by DC to limit lymphedema (LE) progressio, to limit infection risk, to decrease falls risk, and to improve functional mobility and ambulation.-Partially met on LLE w/ 13.67% limb volume reduction measured today.   Baseline dependent   Time 12   Period Weeks   Status New     OT LONG TERM GOAL #3   Title Pt MAX assist and at least 85% compliant with all LE self-care protocols, including simple self-manual lymphatic drainage (MLD), skin care, lymphatic pumping ther ex, and donning/ doffing progression garments.- Pt still learning protocols but shows excellent progress with skin care, wraps, simple self MLD, and ther ex.   Baseline dependent   Time 12   Period Weeks   Status New     OT LONG TERM GOAL #4   Title Lymphedema (LE) management/ self-care:  Pt to tolerate daily compression wraps, garments and devices in keeping w/ prescribed wear regime within 1 week of issue date to progress and retain clinical and functional gains and to limit LE progression.   Baseline dependent   Time 12   Period Weeks   Status New     OT LONG TERM GOAL #5   Title During Management Phase CDT Pt to sustain limb volume reductions achieved during Intensive Phase CDT within 5% utilizing LE self-care protocols, appropriate compression garments/ devices, and moderate caregiver assistance.   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 Exacerbation is due to a recent fall  also resulting in L humeral head fracture. Due to recent changes in her medical status Pt is currently unable to perform LE self-care, including simple self-MLD, skin care, compression therapy, and therapeutic exercise without maximum assistance. These elements of LE self-care are essential for controlling limb swelling, preventing progression, and decreasing falls and infection risk. Despite successful management to date with assistance from her supportive spouse, recent change in medical and functional status pose significant obstacles to basic and instrumental ADLs performance, participation in leisure, social and productive activities at home and in the community, functional mobility and ambulation. Without skilled Occupational Therapy for Intensive and Management phase Complete Decongestive Therapy (CDT) to address chronic, progressive BLE LE, worsening condition and further functional decline are expected. Emphasis of treatment will be on ADLs retraining and caregiver education to support Pt through recent disabilities.   OT Frequency 3x / week   OT Duration 12 weeks   OT Treatment/Interventions Self-care/ADL training;Therapeutic exercise;Functional Mobility Training;Patient/family education;Manual Therapy;Energy conservation;Manual lymph drainage;Other (comment);DME and/or AE instruction;Compression bandaging;Therapeutic activities   Plan Emphasis of OT for CDT will be on caregiver training to assist Pt w/ LE self care and to limit falls risk. Wil fit Pt w/ alternative, adjustable, knee length caytime compression devices ( aka CircAids) to enable Pt to wear regular street shoes  and to decreased physical demands for Pt and caregiver when donning compression garments.. Pt and spouse are in the process of selling their home and may need to interrupt treatment or decrease frequency at some point. This is why efforts for caregiver training are essential  at this time      Patient will benefit from skilled therapeutic intervention in order to improve the following deficits and impairments:  Abnormal gait, Decreased skin integrity, Decreased knowledge of precautions, Improper body mechanics, Decreased activity tolerance, Decreased endurance, Decreased knowledge of use of DME, Decreased strength, Impaired flexibility, Decreased balance, Decreased mobility, Difficulty walking, Decreased range of motion, Increased edema, Pain, Impaired UE functional use  Visit Diagnosis: Lymphedema      G-Codes - 08-08-16 0901    Functional Assessment Tool Used Clinical observation, physical examination, medical records review for H&P, Pt and caregiver interview, comparative limb volumetrics   Functional Limitation Self care   Self Care Current Status (T9030) At least 80 percent but less than 100 percent impaired, limited or restricted   Self Care Goal Status (S9233) At least 1 percent but less than 20 percent impaired, limited or restricted      Problem List Patient Active Problem List   Diagnosis Date Noted  . Vitamin D deficiency 08/20/2014  . Secondary hyperparathyroidism ([place]) 08/20/2014   Andrey Spearman, MS, OTR/L, [facility] 2016/08/08 10:50 AM   [place] MAIN [facility] SERVICES [address] [place], [place], [address] Phone: [phone]   Fax:  [phone]  Name: [name] MRN: [id number] Date of Birth: 1951-08-29

## 2016-07-17 NOTE — Addendum Note (Signed)
Addended by: Ansel Bong on: 07/17/2016 11:10 AM   Modules accepted: Orders

## 2016-07-17 NOTE — Therapy (Signed)
[facility] MAIN [facility] SERVICES [address] [place], [place], [address] Phone: [phone]   Fax:  [phone]  Occupational Therapy Evaluation Lipo-Lymphedema Episode 2  Patient Details  Name: [name] MRN: [id number] Date of Birth: 1951-07-06 Referring Provider: Elias Else, MD  Encounter Date: 07/15/2016      OT End of Session - 07/16/16 1004    Visit Number 1   Number of Visits 36   Date for OT Re-Evaluation 10/13/16   OT Start Time 1106   OT Stop Time 1215   OT Time Calculation (min) 69 min      Past Medical History:  Diagnosis Date  . Peptic ulcer     Past Surgical History:  Procedure Laterality Date  . BREAST SURGERY    . CHOLECYSTECTOMY    . KNEE SURGERY    . mini gastric bypass    . SHOULDER SURGERY      There were no vitals filed for this visit.      Subjective Assessment - 07/17/16 0829    Subjective  Pt returns for new Occupational Therapy evaluation for BLE Lipo-lymphedema. She is referred by Elias Else, MD. Pt is well known to this therapist. [name] lst seen in 11/2015 after sucessfully completing Intensive Phase Complete Decongestive Therapy (CDT).  She met volumetric reduction goals, was fitted with appropriate BLE compression garments and HOS devices, and continues to use a Flexitouch sequential pneumatic compression pump on alternating legs on a regular basis. Pt returns today after a fall  which exacerbated LE and severely limits her ability to perform LE self care. With recent L humeral head fracture she is currenty unable to apply compression wraps and the Flexitouch garment, to perform simple self MLD, skin care and therapeutic lymphatic pumping exercises, the components of Management Phase CDT. She also reports that she has been unable to don bilateral thigh length compression garments for sometime, even with assistance, due to limited use of her hands and decreased UE strength. Pt is accompanied today  by her husband, [name], who plans to assist her w/ LE self care while she is disabled by her UE fracture.   Patient is accompained by: Family member   Pertinent History BLE lipo-lymphedema. Treated w/ Intensive and Management Phase CDT by this OT in 2016 and 2017 w/ excellent results; decreased balance, high fall risk, hx vertigo, decreased functional grasp L hand; decreased BLE mm strength; decreased functional LY   Limitations BLE leg pain and swelling; decreased standing tolerance; difficulty walking; hx frequent falls; decreased balance; hx vestibular problems; recent L humeral head fracture; dependent self care 2/2 decreased functional LUE use; decreased grasp bilaterally 2/2 OA and hx fractures of wrist;    Patient Stated Goals get leg swelling back under control and find some kind of garments my husband and I are able to get on and off   Pain Onset More than a month ago           [facility] OT Assessment - 07/17/16 0001      Assessment   Diagnosis Moderate, stage 2, BLE/BLW lipo-lymphedema   Referring Provider Elias Else, MD   Onset Date 07/17/16   Prior Therapy yes- Intensive Phase CDT to BLE with subsequent Management Phase support     Precautions   Precautions Other (comment)  Standard LE precautions, Falls   Required Braces or Orthoses Other Brace/Splint  LUE sling- full time     Balance Screen   Has the patient fallen in  the past 6 months Yes   How many times? 2   Has the patient had a decrease in activity level because of a fear of falling?  Yes   Is the patient reluctant to leave their home because of a fear of falling?  No     Home  Environment   Family/patient expects to be discharged to: Private residence   Living Arrangements Spouse/significant other   Available Help at Discharge Family   Type of [place] Shower/Tub Mobile - single point   Additional Comments Pt and  spouse are in the process of selling their home. They plan [place] reside [place] house while [place] a new home locally   Lives With Spouse     Prior Function   Level of Independence Independent with basic ADLs;Independent with household mobility without device;Independent with community mobility with device;Independent with transfers;Needs assistance with homemaking;Needs assistance with gait   Vocation Retired     IADL   Prior Level of Airline pilot for transportation;Needs to be accompanied on any shopping trip   Light Housekeeping Does personal laundry completely;Performs light daily tasks such as dishwashing, bed making;Needs help with all home maintenance tasks   Meal Prep Plans, prepares and serves adequate meals independently   Community Mobility Relies on family or friends for transportation   Medication Management Is responsible for taking medication in correct dosages at correct time     Mobility   Mobility Status History of falls   Mobility Status Comments LE weakness, decreased balance, body assymetry 2/2 swelling, and Hx vestibular problems contribute to high falls risk     Written Expression   Dominant Hand Right     Activity Tolerance   Activity Tolerance Comments decreased standing and walking tolerance 2/2 BLE/BLQ pain, weakness and swelling     Cognition   Overall Cognitive Status Within Functional Limits for tasks assessed     Observation/Other Assessments   Observations BLE present w/ markedly increased swelling and tissue density since last seen in 11/2015. Legs below the knees are dense with non-pitting lymphatic congestion and fatty fibrosis in "pantaloon" configuration typical of lipo-lymphedema. Pt complaining of new genital lymphedema. Swelling over pubic bone is clearly visible . Stemmer sign is negative bilaterally.   Skin Integrity Skin is tight below the knees w/ "doughy" thighs. Skin is mildly dry without  signs/symptoms of infection.   Focus on Therapeutic Outcomes (FOTO)  Pt and spouse edu for compression wrapping, MLD, alternative, adjustable daytime compression garments, fall prevention education, volumetric reduction to improve body symmetry and balance,     Sensation   Light Touch Appears Intact          LYMPHEDEMA/ONCOLOGY QUESTIONNAIRE - 07/17/16 0856      What other symptoms do you have   Are you Having Heaviness or Tightness Yes   Are you having pitting edema No   Is it Hard or Difficult finding clothes that fit Yes   Do you have infections No   Is there Decreased scar mobility No   Stemmer Sign No     Lymphedema Stage   Stage STAGE 2 SPONTANEOUSLY IRREVERSIBLE     Lymphedema Assessments   Lymphedema Assessments Lower extremities     Right Lower Extremity Lymphedema   Other BLE comparative limb volumetrics TBA at first Rx visit  OT Treatments/Exercises (OP) - 07/17/16 0001      Transfers   Transfers Sit to Stand;Stand to Sit   Sit to Stand 3: Mod assist   Stand to Sit 5: Supervision     ADLs   Overall ADLs Needs mod -max assistance with all self care and basic and instrumental ADLs at present except feeding     Manual Therapy   Manual Therapy Edema management;Manual Lymphatic Drainage (MLD);Compression Bandaging;Other (comment)  Skin care               OT Education - 07/16/16 1003    Education provided Yes   Education Details Provided Pt/caregiver skilled education and ADL training throughout visit for lymphedema etiology, progression, and treatment including Intensive and Management Phase Complete Decongestive Therapy (CDT)  Discussed lymphedema precautions, cellulitis risk, and all CDT and LE self-care components, including compression wrapping/ garments & devices, lymphatic pumping ther ex, simple self-MLD, and skin care. Provided printed Lymphedema Workbook for reference.   Person(s) Educated Patient;Spouse   Methods  Explanation;Demonstration;Tactile cues;Verbal cues;Handout   Comprehension Verbalized understanding;Need further instruction             OT Long Term Goals - 07/17/16 0859      OT LONG TERM GOAL #1   Title Pt able to correctly apply gradient compression wraps from toes to groiin with Max assistance from caregiver within 2 weeks for optimal limb volume reduction and LE self management over time.- Pt is independent w/ compression wraps. No CG assistance required.   Baseline dependent   Time 2   Period Weeks   Status New     OT LONG TERM GOAL #2   Title Pt to achieve 10% limb volume reductions bilaterally by DC to limit lymphedema (LE) progressio, to limit infection risk, to decrease falls risk, and to improve functional mobility and ambulation.-Partially met on LLE w/ 13.67% limb volume reduction measured today.   Baseline dependent   Time 12   Period Weeks   Status New     OT LONG TERM GOAL #3   Title Pt MAX assist and at least 85% compliant with all LE self-care protocols, including simple self-manual lymphatic drainage (MLD), skin care, lymphatic pumping ther ex, and donning/ doffing progression garments.- Pt still learning protocols but shows excellent progress with skin care, wraps, simple self MLD, and ther ex.   Baseline dependent   Time 12   Period Weeks   Status New     OT LONG TERM GOAL #4   Title Lymphedema (LE) management/ self-care:  Pt to tolerate daily compression wraps, garments and devices in keeping w/ prescribed wear regime within 1 week of issue date to progress and retain clinical and functional gains and to limit LE progression.   Baseline dependent   Time 12   Period Weeks   Status New     OT LONG TERM GOAL #5   Title During Management Phase CDT Pt to sustain limb volume reductions achieved during Intensive Phase CDT within 5% utilizing LE self-care protocols, appropriate compression garments/ devices, and moderate caregiver assistance.   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 Exacerbation is due to a recent fall  also resulting in L humeral head fracture. Due to recent changes in her medical status Pt is currently unable to perform LE self-care, including simple self-MLD, skin care, compression therapy, and therapeutic exercise without maximum assistance. These elements of LE self-care are essential for controlling limb swelling, preventing progression, and decreasing falls and infection risk. Despite successful management to date with assistance from her supportive spouse, recent change in medical and functional status pose significant obstacles to basic and instrumental ADLs performance, participation in leisure, social and productive activities at home and in the community, functional mobility and ambulation. Without skilled Occupational Therapy for Intensive and Management phase Complete Decongestive Therapy (CDT) to address chronic, progressive BLE LE, worsening condition and further functional decline are expected. Emphasis of treatment will be on ADLs retraining and caregiver education to support Pt through recent disabilities.   OT Frequency 3x / week   OT Duration 12 weeks   OT Treatment/Interventions Self-care/ADL training;Therapeutic exercise;Functional Mobility Training;Patient/family education;Manual Therapy;Energy conservation;Manual lymph drainage;Other (comment);DME and/or AE instruction;Compression bandaging;Therapeutic activities   Plan Emphasis of OT for CDT will be on caregiver training to assist Pt w/ LE self care and to limit falls risk. Wil fit Pt w/ alternative, adjustable, knee length caytime compression devices ( aka CircAids) to enable Pt to wear regular street shoes  and to decreased physical demands for Pt and caregiver when donning compression garments.. Pt and spouse are in the process of selling their home and may need to interrupt treatment or decrease frequency at some point. This is why efforts for caregiver training are essential  at this time      Patient will benefit from skilled therapeutic intervention in order to improve the following deficits and impairments:  Abnormal gait, Decreased skin integrity, Decreased knowledge of precautions, Improper body mechanics, Decreased activity tolerance, Decreased endurance, Decreased knowledge of use of DME, Decreased strength, Impaired flexibility, Decreased balance, Decreased mobility, Difficulty walking, Decreased range of motion, Increased edema, Pain, Impaired UE functional use  Visit Diagnosis: Lymphedema - Plan: Ot plan of care cert/re-cert      G-Codes - 16/94/50 0901    Functional Assessment Tool Used Clinical observation, physical examination, medical records review for H&P, Pt and caregiver interview, comparative limb volumetrics   Functional Limitation Self care   Self Care Current Status (T8882) At least 80 percent but less than 100 percent impaired, limited or restricted   Self Care Goal Status (C0034) At least 1 percent but less than 20 percent impaired, limited or restricted      Problem List Patient Active Problem List   Diagnosis Date Noted  . Vitamin D deficiency 08/20/2014  . Secondary hyperparathyroidism ([place]) 08/20/2014    Ansel Bong 07/17/2016, 11:08 AM  [place] MAIN [facility] SERVICES [address] [place], [place], [address] Phone: [phone]   Fax:  [phone]  Name: [name] MRN: [id number] Date of Birth: 19-Apr-1951

## 2016-07-20 ENCOUNTER — Ambulatory Visit: Payer: 59 | Admitting: Occupational Therapy

## 2016-07-20 DIAGNOSIS — I890 Lymphedema, not elsewhere classified: Secondary | ICD-10-CM

## 2016-07-20 NOTE — Therapy (Signed)
[place] MAIN [facility] SERVICES [address] [place], [place], [address] Phone: [phone]   Fax:  [phone]  Occupational Therapy Treatment  Patient Details  Name: [name] MRN: [id number] Date of Birth: 1951/01/13 Referring Provider: Maury Dus, MD  Encounter Date: 07/20/2016      OT End of Session - 07/20/16 1458    Visit Number 2   Number of Visits 36   Date for OT Re-Evaluation 10/13/16   OT Start Time 1116   OT Stop Time 1220   OT Time Calculation (min) 64 min      Past Medical History:  Diagnosis Date  . Peptic ulcer     Past Surgical History:  Procedure Laterality Date  . BREAST SURGERY    . CHOLECYSTECTOMY    . KNEE SURGERY    . mini gastric bypass    . SHOULDER SURGERY      There were no vitals filed for this visit.      Subjective Assessment - 07/20/16 1455    Subjective  Pt presents for OT visit 2 to address BLE lipo-lymphedema. She is accompanied by her supportive spouse, Richardson Landry, who is here to learn to assist with applying compression wraps. Pt reports that her husband did not assist w/ compression wraps during visit interval.   Patient is accompained by: Family member   Pertinent History BLE lipo-lymphedema. Treated w/ Intensive and Management Phase CDT by this OT in 2016 and 2017 w/ excellent results; decreased balance, high fall risk, hx vertigo, decreased functional grasp L hand; decreased BLE mm strength; decreased functional LY   Limitations BLE leg pain and swelling; decreased standing tolerance; difficulty walking; hx frequent falls; decreased balance; hx vestibular problems; recent L humeral head fracture; dependent self care 2/2 decreased functional LUE use; decreased grasp bilaterally 2/2 OA and hx fractures of wrist;    Patient Stated Goals get leg swelling back under control and find some kind of garments my husband and I are able to get on and off   Pain Onset More than a month ago                       OT Treatments/Exercises (OP) - 07/20/16 0001      Manual Therapy   Manual Therapy Edema management;Compression Bandaging   Edema Management LLE gradient compression wraps applied circumferentially in gradient configuration from toes to groin as follows: custom toe wrap using 1 and 2" co-wrap under cotton stockinett from toes to groin; 8 cm x 1 to foot and ankle, 10 cm x 1, then 12 cm x 2- all layered over .04 x 10 cm and 12 cm Rosidol Soft foam from A-G.                OT Education - 07/20/16 1457    Education provided Yes   Education Details Emphasis of LE ADL training today on patient and caregiver edu for proper compression wraps application using  circumferential, gradient techniques and proper positioning.    Person(s) Educated Patient;Spouse   Methods Explanation;Demonstration;Tactile cues;Verbal cues;Handout   Comprehension Verbalized understanding;Returned demonstration;Verbal cues required;Tactile cues required;Need further instruction             OT Long Term Goals - 07/17/16 0859      OT LONG TERM GOAL #1   Title Pt able to correctly apply gradient compression wraps from toes to groiin with Max assistance from caregiver within 2 weeks for optimal limb volume  reduction and LE self management over time.- Pt is independent w/ compression wraps. No CG assistance required.   Baseline dependent   Time 2   Period Weeks   Status New     OT LONG TERM GOAL #2   Title Pt to achieve 10% limb volume reductions bilaterally by DC to limit lymphedema (LE) progressio, to limit infection risk, to decrease falls risk, and to improve functional mobility and ambulation.-Partially met on LLE w/ 13.67% limb volume reduction measured today.   Baseline dependent   Time 12   Period Weeks   Status New     OT LONG TERM GOAL #3   Title Pt MAX assist and at least 85% compliant with all LE self-care protocols, including simple self-manual lymphatic  drainage (MLD), skin care, lymphatic pumping ther ex, and donning/ doffing progression garments.- Pt still learning protocols but shows excellent progress with skin care, wraps, simple self MLD, and ther ex.   Baseline dependent   Time 12   Period Weeks   Status New     OT LONG TERM GOAL #4   Title Lymphedema (LE) management/ self-care:  Pt to tolerate daily compression wraps, garments and devices in keeping w/ prescribed wear regime within 1 week of issue date to progress and retain clinical and functional gains and to limit LE progression.   Baseline dependent   Time 12   Period Weeks   Status New     OT LONG TERM GOAL #5   Title During Management Phase CDT Pt to sustain limb volume reductions achieved during Intensive Phase CDT within 5% utilizing LE self-care protocols, appropriate compression garments/ devices, and moderate caregiver assistance.   Baseline dependent   Time 6   Period Months   Status New               Plan - 07/20/16 1500    Clinical Impression Statement By end of session caregiver required moderate assistance, including demonstration, verbal and tactile cues, and a hand written instruction sheet with drawings to apply compression wraps to LLE from ankle to groin. Pt encouraged to cue spouse when compression is too lose or too tight. Pt and spouse  reminded that without daily compression her condition will not improve.   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 Exacerbation is due to a recent fall also resulting in L humeral head fracture. Due to recent changes in her medical status Pt is currently unable to perform LE self-care, including simple self-MLD, skin care, compression therapy, and therapeutic exercise without maximum assistance. These elements of LE self-care are essential for controlling limb  swelling, preventing progression, and decreasing falls and infection risk. Despite successful management to date with assistance from her supportive spouse, recent change in medical and functional status pose significant obstacles to basic and instrumental ADLs performance, participation in leisure, social and productive activities at home and in the community, functional mobility and ambulation. Without skilled Occupational Therapy for Intensive and Management phase Complete Decongestive Therapy (CDT) to address chronic, progressive BLE LE, worsening condition and further functional decline are expected. Emphasis of treatment will be on ADLs retraining and caregiver education to support Pt through recent disabilities.   OT Frequency 3x / week   OT Duration 12 weeks   OT Treatment/Interventions Self-care/ADL training;Therapeutic exercise;Functional Mobility Training;Patient/family education;Manual Therapy;Energy conservation;Manual lymph drainage;Other (comment);DME and/or AE instruction;Compression bandaging;Therapeutic activities  Patient will benefit from skilled therapeutic intervention in order to improve the following deficits and impairments:  Abnormal gait, Decreased skin integrity, Decreased knowledge of precautions, Improper body mechanics, Decreased activity tolerance, Decreased endurance, Decreased knowledge of use of DME, Decreased strength, Impaired flexibility, Decreased balance, Decreased mobility, Difficulty walking, Decreased range of motion, Increased edema, Pain, Impaired UE functional use  Visit Diagnosis: Lymphedema    Problem List Patient Active Problem List   Diagnosis Date Noted  . Vitamin D deficiency 08/20/2014  . Secondary hyperparathyroidism ([place]) 08/20/2014    Andrey Spearman, MS, OTR/L, [facility] 07/20/16 3:04 PM  [place] MAIN [facility] SERVICES [address] [place], [place], [address] Phone: [phone]   Fax:   [phone]  Name: [name] MRN: [id number] Date of Birth: 18-Nov-1950

## 2016-07-22 ENCOUNTER — Ambulatory Visit: Payer: 59 | Admitting: Occupational Therapy

## 2016-07-22 DIAGNOSIS — I890 Lymphedema, not elsewhere classified: Secondary | ICD-10-CM

## 2016-07-22 NOTE — Therapy (Signed)
[place] MAIN [facility] SERVICES [address] [place], [place], [address] Phone: [phone]   Fax:  [phone]  Occupational Therapy Treatment  Patient Details  Name: [name] MRN: [id number] Date of Birth: 1951-03-20 Referring Provider: Maury Dus, MD  Encounter Date: 07/22/2016      OT End of Session - 07/22/16 1644    Visit Number 3   Number of Visits 36   Date for OT Re-Evaluation 10/13/16   OT Start Time 5009   OT Stop Time 1207   OT Time Calculation (min) 62 min   Activity Tolerance Patient tolerated treatment well;No increased pain   Behavior During Therapy WFL for tasks assessed/performed      Past Medical History:  Diagnosis Date  . Peptic ulcer     Past Surgical History:  Procedure Laterality Date  . BREAST SURGERY    . CHOLECYSTECTOMY    . KNEE SURGERY    . mini gastric bypass    . SHOULDER SURGERY      There were no vitals filed for this visit.      Subjective Assessment - 07/22/16 1640    Subjective  Pt presents for OT visit 3 to address BLE lipo-lymphedema. She is accompanied by her supportive spouse, Richardson Landry. Pt reports that Richardson Landry did a great job wrapping her leg during the visit interval. "I feel like it's getting some better, don't you?"   Patient is accompained by: Family member   Pertinent History BLE lipo-lymphedema. Treated w/ Intensive and Management Phase CDT by this OT in 2016 and 2017 w/ excellent results; decreased balance, high fall risk, hx vertigo, decreased functional grasp L hand; decreased BLE mm strength; decreased functional LY   Limitations BLE leg pain and swelling; decreased standing tolerance; difficulty walking; hx frequent falls; decreased balance; hx vestibular problems; recent L humeral head fracture; dependent self care 2/2 decreased functional LUE use; decreased grasp bilaterally 2/2 OA and hx fractures of wrist;    Patient Stated Goals get leg swelling back under control and find  some kind of garments my husband and I are able to get on and off   Currently in Pain? No/denies   Pain Onset More than a month ago                      OT Treatments/Exercises (OP) - 07/22/16 0001      ADLs   ADL Education Given Yes     Manual Therapy   Manual Therapy Edema management;Manual Lymphatic Drainage (MLD);Compression Bandaging   Edema Management skin care w/ low pH castor oil during MLD   Manual Lymphatic Drainage (MLD) LLE Manual lymph drainage to (MLD) in supine utilizing ipsilateral inguinal-axillary anastamosis, deep abdominal lymphatics via diaphragmatic breathing  and functional groin LNs as is customary to mobilize cancer related lower extremity LE. Sequence included bilateral "short neck" sequence, J strokes to sub and supraclavicular LN, deep abdominal pathways via effective breathing, (no deep strokes to colon or abdomen 2/2 chronic diarrhea);  functional inguinal LN, L lower extremity- proximal to distal -w/ emphasis on thigh and medial knee bottleneck. Performed light fibrosis technique to [facility] and distal  legs to address fatty fibrosis. Good tolerance.   Compression Bandaging LLE gradient compression wraps applied circumferentially in gradient configuration from toes to groin as follows: custom toe wrap using 1 and 2" co-wrap under cotton stockinett from toes to groin; 8 cm x 1 to foot and ankle, 10 cm x 1, then  12 cm x 2- all layered over .04 x 10 cm and 12 cm Rosidol Soft foam from A-G.                OT Education - 07/22/16 1643    Education provided Yes   Education Details Pt education centered on review of simple self MLD. Cont Pt and spouse edu for compression wrapping excluding foot.    Person(s) Educated Spouse;Patient   Methods Demonstration;Explanation;Tactile cues;Verbal cues   Comprehension Verbalized understanding;Returned demonstration;Verbal cues required;Need further instruction;Tactile cues required             OT  Long Term Goals - 07/17/16 0859      OT LONG TERM GOAL #1   Title Pt able to correctly apply gradient compression wraps from toes to groiin with Max assistance from caregiver within 2 weeks for optimal limb volume reduction and LE self management over time.- Pt is independent w/ compression wraps. No CG assistance required.   Baseline dependent   Time 2   Period Weeks   Status New     OT LONG TERM GOAL #2   Title Pt to achieve 10% limb volume reductions bilaterally by DC to limit lymphedema (LE) progressio, to limit infection risk, to decrease falls risk, and to improve functional mobility and ambulation.-Partially met on LLE w/ 13.67% limb volume reduction measured today.   Baseline dependent   Time 12   Period Weeks   Status New     OT LONG TERM GOAL #3   Title Pt MAX assist and at least 85% compliant with all LE self-care protocols, including simple self-manual lymphatic drainage (MLD), skin care, lymphatic pumping ther ex, and donning/ doffing progression garments.- Pt still learning protocols but shows excellent progress with skin care, wraps, simple self MLD, and ther ex.   Baseline dependent   Time 12   Period Weeks   Status New     OT LONG TERM GOAL #4   Title Lymphedema (LE) management/ self-care:  Pt to tolerate daily compression wraps, garments and devices in keeping w/ prescribed wear regime within 1 week of issue date to progress and retain clinical and functional gains and to limit LE progression.   Baseline dependent   Time 12   Period Weeks   Status New     OT LONG TERM GOAL #5   Title During Management Phase CDT Pt to sustain limb volume reductions achieved during Intensive Phase CDT within 5% utilizing LE self-care protocols, appropriate compression garments/ devices, and moderate caregiver assistance.   Baseline dependent   Time 6   Period Months   Status New               Plan - 07/22/16 1645    Clinical Impression Statement LLE is decreased in limb  volume and tissue density since commencing OT for CDT. Spouse's compression wrapping skills continue to improve w/ practice and with  more active instruction and feedback from Pt. Pt tolerating compression and MLD without difficulty. Performing selfd MLD is very challenging due to LUE in sling due to recent radial head fracture.   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 Exacerbation is due to a recent fall also resulting in L humeral head fracture. Due to recent changes in her medical status Pt is currently unable to perform LE self-care, including simple self-MLD, skin care, compression therapy, and therapeutic  exercise without maximum assistance. These elements of LE self-care are essential for controlling limb swelling, preventing progression, and decreasing falls and infection risk. Despite successful management to date with assistance from her supportive spouse, recent change in medical and functional status pose significant obstacles to basic and instrumental ADLs performance, participation in leisure, social and productive activities at home and in the community, functional mobility and ambulation. Without skilled Occupational Therapy for Intensive and Management phase Complete Decongestive Therapy (CDT) to address chronic, progressive BLE LE, worsening condition and further functional decline are expected. Emphasis of treatment will be on ADLs retraining and caregiver education to support Pt through recent disabilities.   OT Frequency 3x / week   OT Duration 12 weeks   OT Treatment/Interventions Self-care/ADL training;Therapeutic exercise;Functional Mobility Training;Patient/family education;Manual Therapy;Energy conservation;Manual lymph drainage;Other (comment);DME and/or AE instruction;Compression bandaging;Therapeutic activities      Patient  will benefit from skilled therapeutic intervention in order to improve the following deficits and impairments:  Abnormal gait, Decreased skin integrity, Decreased knowledge of precautions, Improper body mechanics, Decreased activity tolerance, Decreased endurance, Decreased knowledge of use of DME, Decreased strength, Impaired flexibility, Decreased balance, Decreased mobility, Difficulty walking, Decreased range of motion, Increased edema, Pain, Impaired UE functional use  Visit Diagnosis: Lymphedema    Problem List Patient Active Problem List   Diagnosis Date Noted  . Vitamin D deficiency 08/20/2014  . Secondary hyperparathyroidism ([place]) 08/20/2014    Andrey Spearman, MS, OTR/L, [facility] 07/22/16 4:48 PM  [place] MAIN [facility] SERVICES [address] [place], [place], [address] Phone: [phone]   Fax:  [phone]  Name: [name] MRN: [id number] Date of Birth: 1951-04-01

## 2016-07-22 NOTE — Patient Instructions (Signed)
LE instructions and precautions as established- see initial eval.   

## 2016-07-24 ENCOUNTER — Encounter: Payer: 59 | Admitting: Occupational Therapy

## 2016-07-27 ENCOUNTER — Ambulatory Visit: Payer: 59 | Admitting: Occupational Therapy

## 2016-07-27 DIAGNOSIS — I890 Lymphedema, not elsewhere classified: Secondary | ICD-10-CM

## 2016-07-27 NOTE — Therapy (Signed)
[place] MAIN [facility] SERVICES [address] [place], [place], [address] Phone: [phone]   Fax:  [phone]  Occupational Therapy Treatment  Patient Details  Name: [name] MRN: [id number] Date of Birth: 1951/05/15 Referring Provider: Maury Dus, MD  Encounter Date: 07/27/2016      OT End of Session - 07/27/16 1555    Visit Number 4   Number of Visits 36   Date for OT Re-Evaluation 10/13/16   OT Start Time 1110   OT Stop Time 1210   OT Time Calculation (min) 60 min   Equipment Utilized During Treatment Reviewed simple self MLD today, including short neck sequence, abdominal breathing, thigh, knee and leg sequennces.    Activity Tolerance Patient tolerated treatment well;No increased pain   Behavior During Therapy WFL for tasks assessed/performed      Past Medical History:  Diagnosis Date  . Peptic ulcer     Past Surgical History:  Procedure Laterality Date  . BREAST SURGERY    . CHOLECYSTECTOMY    . KNEE SURGERY    . mini gastric bypass    . SHOULDER SURGERY      There were no vitals filed for this visit.      Subjective Assessment - 07/27/16 1553    Subjective  Pt presents for OT visit 4  to address BLE lipo-lymphedema. She is accompanied by her supportive spouse, [name]. Pt reports [name] has been wrapping daily and doing a really great job. She reports that her house   is  now under contract and she expects to be moving in approximately 3-4 weeks.   Patient is accompained by: Family member   Pertinent History BLE lipo-lymphedema. Treated w/ Intensive and Management Phase CDT by this OT in 2016 and 2017 w/ excellent results; decreased balance, high fall risk, hx vertigo, decreased functional grasp L hand; decreased BLE mm strength; decreased functional LY   Limitations BLE leg pain and swelling; decreased standing tolerance; difficulty walking; hx frequent falls; decreased balance; hx vestibular problems; recent L  humeral head fracture; dependent self care 2/2 decreased functional LUE use; decreased grasp bilaterally 2/2 OA and hx fractures of wrist;    Patient Stated Goals get leg swelling back under control and find some kind of garments my husband and I are able to get on and off   Pain Onset More than a month ago                      OT Treatments/Exercises (OP) - 07/27/16 0001      ADLs   ADL Education Given Yes     Manual Therapy   Manual Therapy Edema management;Manual Lymphatic Drainage (MLD);Compression Bandaging   Edema Management skin care w/ low pH castor oil during MLD   Manual Lymphatic Drainage (MLD) RLE Manual lymph drainage to (MLD) in supine utilizing ipsilateral inguinal-axillary anastamosis, deep abdominal lymphatics via diaphragmatic breathing  and functional groin LNs as is customary to mobilize cancer related lower extremity LE. Sequence included bilateral "short neck" sequence, J strokes to sub and supraclavicular LN, deep abdominal pathways via effective breathing, (no deep strokes to colon or abdomen 2/2 chronic diarrhea);  functional inguinal LN, L lower extremity- proximal to distal -w/ emphasis on thigh and medial knee bottleneck. Performed light fibrosis technique to [facility] and distal  legs to address fatty fibrosis. Good tolerance.   Compression Bandaging RLE gradient compression wraps applied circumferentially in gradient configuration from toes to groin as  follows: custom toe wrap using 1 and 2" co-wrap under cotton stockinett from toes to groin; 8 cm x 1 to foot and ankle, 10 cm x 1, then 12 cm x 2- all layered over .04 x 10 cm and 12 cm Rosidol Soft foam from A-G.                     OT Long Term Goals - 07/17/16 0859      OT LONG TERM GOAL #1   Title Pt able to correctly apply gradient compression wraps from toes to groiin with Max assistance from caregiver within 2 weeks for optimal limb volume reduction and LE self management over time.-  Pt is independent w/ compression wraps. No CG assistance required.   Baseline dependent   Time 2   Period Weeks   Status New     OT LONG TERM GOAL #2   Title Pt to achieve 10% limb volume reductions bilaterally by DC to limit lymphedema (LE) progressio, to limit infection risk, to decrease falls risk, and to improve functional mobility and ambulation.-Partially met on LLE w/ 13.67% limb volume reduction measured today.   Baseline dependent   Time 12   Period Weeks   Status New     OT LONG TERM GOAL #3   Title Pt MAX assist and at least 85% compliant with all LE self-care protocols, including simple self-manual lymphatic drainage (MLD), skin care, lymphatic pumping ther ex, and donning/ doffing progression garments.- Pt still learning protocols but shows excellent progress with skin care, wraps, simple self MLD, and ther ex.   Baseline dependent   Time 12   Period Weeks   Status New     OT LONG TERM GOAL #4   Title Lymphedema (LE) management/ self-care:  Pt to tolerate daily compression wraps, garments and devices in keeping w/ prescribed wear regime within 1 week of issue date to progress and retain clinical and functional gains and to limit LE progression.   Baseline dependent   Time 12   Period Weeks   Status New     OT LONG TERM GOAL #5   Title During Management Phase CDT Pt to sustain limb volume reductions achieved during Intensive Phase CDT within 5% utilizing LE self-care protocols, appropriate compression garments/ devices, and moderate caregiver assistance.   Baseline dependent   Time 6   Period Months   Status New               Plan - 07/27/16 1557    Clinical Impression Statement Commenced RLE CDT today as this leg is very densley congested. .Pt is concerned that her legs are getting worse, but by visual assessment and palpation, the LLE is mildly improved. Pt's spouse is gaining skill and confidence with compression wraps  after wraping daily during visit  interval.    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 Exacerbation is due to a recent fall also resulting in L humeral head fracture. Due to recent changes in her medical status Pt is currently unable to perform LE self-care, including simple self-MLD, skin care, compression therapy, and therapeutic exercise without maximum assistance. These elements of LE self-care are essential for controlling limb swelling, preventing progression, and decreasing falls and infection risk. Despite successful management to date with assistance from her supportive spouse, recent change in medical and functional status pose significant obstacles  to basic and instrumental ADLs performance, participation in leisure, social and productive activities at home and in the community, functional mobility and ambulation. Without skilled Occupational Therapy for Intensive and Management phase Complete Decongestive Therapy (CDT) to address chronic, progressive BLE LE, worsening condition and further functional decline are expected. Emphasis of treatment will be on ADLs retraining and caregiver education to support Pt through recent disabilities.   OT Frequency 3x / week   OT Duration 12 weeks   OT Treatment/Interventions Self-care/ADL training;Therapeutic exercise;Functional Mobility Training;Patient/family education;Manual Therapy;Energy conservation;Manual lymph drainage;Other (comment);DME and/or AE instruction;Compression bandaging;Therapeutic activities      Patient will benefit from skilled therapeutic intervention in order to improve the following deficits and impairments:  Abnormal gait, Decreased skin integrity, Decreased knowledge of precautions, Improper body mechanics, Decreased activity tolerance, Decreased endurance, Decreased knowledge of use of DME, Decreased strength,  Impaired flexibility, Decreased balance, Decreased mobility, Difficulty walking, Decreased range of motion, Increased edema, Pain, Impaired UE functional use  Visit Diagnosis: Lymphedema    Problem List Patient Active Problem List   Diagnosis Date Noted  . Vitamin D deficiency 08/20/2014  . Secondary hyperparathyroidism ([place]) 08/20/2014    Andrey Spearman, MS, OTR/L, [facility] 07/27/16 4:04 PM    [place] MAIN [facility] SERVICES [address] [place], [place], [address] Phone: [phone]   Fax:  [phone]  Name: [name] MRN: [id number] Date of Birth: 1951/01/02

## 2016-07-28 ENCOUNTER — Ambulatory Visit: Payer: 59 | Admitting: Occupational Therapy

## 2016-07-28 DIAGNOSIS — I890 Lymphedema, not elsewhere classified: Secondary | ICD-10-CM

## 2016-07-28 NOTE — Therapy (Signed)
[place] MAIN [facility] SERVICES [address] [place], [place], [address] Phone: [phone]   Fax:  [phone]  Occupational Therapy Treatment  Patient Details  Name: [name] MRN: [id number] Date of Birth: [birth year] Referring Provider: Maury Dus, MD  Encounter Date: 07/28/2016      OT End of Session - 07/28/16 1323    Visit Number 5   Number of Visits 36   Date for OT Re-Evaluation 10/13/16   OT Start Time 1106   OT Stop Time 1208   OT Time Calculation (min) 62 min      Past Medical History:  Diagnosis Date  . Peptic ulcer     Past Surgical History:  Procedure Laterality Date  . BREAST SURGERY    . CHOLECYSTECTOMY    . KNEE SURGERY    . mini gastric bypass    . SHOULDER SURGERY      There were no vitals filed for this visit.      Subjective Assessment - 07/28/16 1319    Subjective  Pt presents for OT visit 5 to address BLE lipo-lymphedema. She is accompanie d by her supportive spouse, Richardson Landry. Pt reports she had marked decrease in RLE swelling and associated pain after MLD and compression wrap at last session. Pt reports spouse is wrapping well daily and has been practicing simple MLD.   Patient is accompained by: Family member   Pertinent History BLE lipo-lymphedema. Treated w/ Intensive and Management Phase CDT by this OT in 2016 and 2017 w/ excellent results; decreased balance, high fall risk, hx vertigo, decreased functional grasp L hand; decreased BLE mm strength; decreased functional LY   Limitations BLE leg pain and swelling; decreased standing tolerance; difficulty walking; hx frequent falls; decreased balance; hx vestibular problems; recent L humeral head fracture; dependent self care 2/2 decreased functional LUE use; decreased grasp bilaterally 2/2 OA and hx fractures of wrist;    Patient Stated Goals get leg swelling back under control and find some kind of garments my husband and I are able to get on and off    Currently in Pain? No/denies   Pain Onset More than a month ago                      OT Treatments/Exercises (OP) - 07/28/16 0001      ADLs   ADL Education Given Yes     Manual Therapy   Manual Therapy Edema management;Manual Lymphatic Drainage (MLD);Compression Bandaging   Edema Management skin care w/ low pH castor oil during MLD   Manual Lymphatic Drainage (MLD) RLE Manual lymph drainage to (MLD) in supine utilizing ipsilateral inguinal-axillary anastamosis, deep abdominal lymphatics via diaphragmatic breathing  and functional groin LNs as is customary to mobilize cancer related lower extremity LE. Sequence included bilateral "short neck" sequence, J strokes to sub and supraclavicular LN, deep abdominal pathways via effective breathing, (no deep strokes to colon or abdomen 2/2 chronic diarrhea);  functional inguinal LN, L lower extremity- proximal to distal -w/ emphasis on thigh and medial knee bottleneck. Performed light fibrosis technique to [facility] and distal  legs to address fatty fibrosis. Good tolerance.   Compression Bandaging RLE gradient compression wraps applied circumferentially in gradient configuration from toes to groin as follows: custom toe wrap using 1 and 2" co-wrap under cotton stockinett from toes to groin; 8 cm x 1 to foot and ankle, 10 cm x 1, then 12 cm x 2- all layered over .04 x  10 cm and 12 cm Rosidol Soft foam from A-G.                OT Education - 07/28/16 1321    Education provided Yes   Education Details Pt and spouse edu for simple MLD. Pt unable to perform on self 2/2 broken shoulder w/ arm in sling. Spouse is willing to learn so he can assist between sessions.   Person(s) Educated Patient;Spouse   Methods Explanation;Demonstration;Tactile cues;Verbal cues;Handout   Comprehension Verbalized understanding;Returned demonstration;Verbal cues required;Tactile cues required;Need further instruction             OT Long Term Goals  - 07/17/16 0859      OT LONG TERM GOAL #1   Title Pt able to correctly apply gradient compression wraps from toes to groiin with Max assistance from caregiver within 2 weeks for optimal limb volume reduction and LE self management over time.- Pt is independent w/ compression wraps. No CG assistance required.   Baseline dependent   Time 2   Period Weeks   Status New     OT LONG TERM GOAL #2   Title Pt to achieve 10% limb volume reductions bilaterally by DC to limit lymphedema (LE) progressio, to limit infection risk, to decrease falls risk, and to improve functional mobility and ambulation.-Partially met on LLE w/ 13.67% limb volume reduction measured today.   Baseline dependent   Time 12   Period Weeks   Status New     OT LONG TERM GOAL #3   Title Pt MAX assist and at least 85% compliant with all LE self-care protocols, including simple self-manual lymphatic drainage (MLD), skin care, lymphatic pumping ther ex, and donning/ doffing progression garments.- Pt still learning protocols but shows excellent progress with skin care, wraps, simple self MLD, and ther ex.   Baseline dependent   Time 12   Period Weeks   Status New     OT LONG TERM GOAL #4   Title Lymphedema (LE) management/ self-care:  Pt to tolerate daily compression wraps, garments and devices in keeping w/ prescribed wear regime within 1 week of issue date to progress and retain clinical and functional gains and to limit LE progression.   Baseline dependent   Time 12   Period Weeks   Status New     OT LONG TERM GOAL #5   Title During Management Phase CDT Pt to sustain limb volume reductions achieved during Intensive Phase CDT within 5% utilizing LE self-care protocols, appropriate compression garments/ devices, and moderate caregiver assistance.   Baseline dependent   Time 6   Period Months   Status New               Plan - 07/28/16 1324    Clinical Impression Statement RLE is markedly less dense below the  knee. Thick pitting edema in ankle responded very positively to fibrosis effect. Spouse has mastered compression wraps   for assistance between OT sessions. He is more receptive to learning and performing  simple MLD between OT sessions. Cont as per POC w/ emphasis shifting to RLE per Pt request.   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 Exacerbation is due to a recent fall also resulting in L humeral head fracture. Due to recent changes in her medical status Pt is currently unable to perform LE self-care, including simple self-MLD, skin care, compression therapy,  and therapeutic exercise without maximum assistance. These elements of LE self-care are essential for controlling limb swelling, preventing progression, and decreasing falls and infection risk. Despite successful management to date with assistance from her supportive spouse, recent change in medical and functional status pose significant obstacles to basic and instrumental ADLs performance, participation in leisure, social and productive activities at home and in the community, functional mobility and ambulation. Without skilled Occupational Therapy for Intensive and Management phase Complete Decongestive Therapy (CDT) to address chronic, progressive BLE LE, worsening condition and further functional decline are expected. Emphasis of treatment will be on ADLs retraining and caregiver education to support Pt through recent disabilities.   OT Frequency 3x / week   OT Duration 12 weeks   OT Treatment/Interventions Self-care/ADL training;Therapeutic exercise;Functional Mobility Training;Patient/family education;Manual Therapy;Energy conservation;Manual lymph drainage;Other (comment);DME and/or AE instruction;Compression bandaging;Therapeutic activities      Patient will benefit from skilled  therapeutic intervention in order to improve the following deficits and impairments:  Abnormal gait, Decreased skin integrity, Decreased knowledge of precautions, Improper body mechanics, Decreased activity tolerance, Decreased endurance, Decreased knowledge of use of DME, Decreased strength, Impaired flexibility, Decreased balance, Decreased mobility, Difficulty walking, Decreased range of motion, Increased edema, Pain, Impaired UE functional use  Visit Diagnosis: Lymphedema    Problem List Patient Active Problem List   Diagnosis Date Noted  . Vitamin D deficiency 08/20/2014  . Secondary hyperparathyroidism ([place]) 08/20/2014    Andrey Spearman, MS, OTR/L, [facility] 07/28/16 1:27 PM  [place] MAIN [facility] SERVICES [address] [place], [place], [address] Phone: [phone]   Fax:  [phone]  Name: [name] MRN: [id number] Date of Birth: 1951-05-30

## 2016-08-03 ENCOUNTER — Ambulatory Visit: Payer: 59 | Admitting: Occupational Therapy

## 2016-08-03 DIAGNOSIS — I890 Lymphedema, not elsewhere classified: Secondary | ICD-10-CM

## 2016-08-05 NOTE — Therapy (Signed)
[place] MAIN [facility] SERVICES [address] [place], [place], [address] Phone: [phone]   Fax:  [phone]  Occupational Therapy Treatment  Patient Details  Name: [name] MRN: [id number] Date of Birth: Jan 12, 1951 Referring Provider: Maury Dus, MD  Encounter Date: 08/03/2016    Past Medical History:  Diagnosis Date  . Peptic ulcer     Past Surgical History:  Procedure Laterality Date  . BREAST SURGERY    . CHOLECYSTECTOMY    . KNEE SURGERY    . mini gastric bypass    . SHOULDER SURGERY      There were no vitals filed for this visit.      Subjective Assessment - 08/05/16 0945    Subjective  Pt presents for OT visit 6 to address BLE lipo-lymphedema. She is accompanied by her supportive spouse, Richardson Landry. Pt states she is discouraged b/c swelling has gotten so much worse and now genital swelling is also apparent. She reports she has not ben very dilligent in using her Flexitouch pump over the past few weeks.   Patient is accompained by: Family member   Pertinent History BLE lipo-lymphedema. Treated w/ Intensive and Management Phase CDT by this OT in 2016 and 2017 w/ excellent results; decreased balance, high fall risk, hx vertigo, decreased functional grasp L hand; decreased BLE mm strength; decreased functional LY   Limitations BLE leg pain and swelling; decreased standing tolerance; difficulty walking; hx frequent falls; decreased balance; hx vestibular problems; recent L humeral head fracture; dependent self care 2/2 decreased functional LUE use; decreased grasp bilaterally 2/2 OA and hx fractures of wrist;    Patient Stated Goals get leg swelling back under control and find some kind of garments my husband and I are able to get on and off   Pain Onset More than a month ago                              OT Education - 08/05/16 0947    Education provided Yes             OT Long Term Goals -  07/17/16 0859      OT LONG TERM GOAL #1   Title Pt able to correctly apply gradient compression wraps from toes to groiin with Max assistance from caregiver within 2 weeks for optimal limb volume reduction and LE self management over time.- Pt is independent w/ compression wraps. No CG assistance required.   Baseline dependent   Time 2   Period Weeks   Status New     OT LONG TERM GOAL #2   Title Pt to achieve 10% limb volume reductions bilaterally by DC to limit lymphedema (LE) progressio, to limit infection risk, to decrease falls risk, and to improve functional mobility and ambulation.-Partially met on LLE w/ 13.67% limb volume reduction measured today.   Baseline dependent   Time 12   Period Weeks   Status New     OT LONG TERM GOAL #3   Title Pt MAX assist and at least 85% compliant with all LE self-care protocols, including simple self-manual lymphatic drainage (MLD), skin care, lymphatic pumping ther ex, and donning/ doffing progression garments.- Pt still learning protocols but shows excellent progress with skin care, wraps, simple self MLD, and ther ex.   Baseline dependent   Time 12   Period Weeks   Status New     OT LONG TERM GOAL #4  Title Lymphedema (LE) management/ self-care:  Pt to tolerate daily compression wraps, garments and devices in keeping w/ prescribed wear regime within 1 week of issue date to progress and retain clinical and functional gains and to limit LE progression.   Baseline dependent   Time 12   Period Weeks   Status New     OT LONG TERM GOAL #5   Title During Management Phase CDT Pt to sustain limb volume reductions achieved during Intensive Phase CDT within 5% utilizing LE self-care protocols, appropriate compression garments/ devices, and moderate caregiver assistance.   Baseline dependent   Time 6   Period Months   Status New               Plan - 08/05/16 0951    Clinical Impression Statement By end of session Pt able to perform deep  abdominal lymphatic stimulation using belly breathing and crunch techniques without assistance. Pt is discouraged about inability to assist w/ moving chores at home due to humeral freactur accompanied by worsening BLE lymphedema. Pt's mobility is severely compromised at present and she feels frustrated with her condition. Reassured Pt that the deep breathing technique has 2ndary benefit of helping her to relax while reducing ancxiety, which in turn will stimulate abdominal vessels to reduce genital LE. Cont as Per POC.   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 Exacerbation is due to a recent fall also resulting in L humeral head fracture. Due to recent changes in her medical status Pt is currently unable to perform LE self-care, including simple self-MLD, skin care, compression therapy, and therapeutic exercise without maximum assistance. These elements of LE self-care are essential for controlling limb swelling, preventing progression, and decreasing falls and infection risk. Despite successful management to date with assistance from her supportive spouse, recent change in medical and functional status pose significant obstacles to basic and instrumental ADLs performance, participation in leisure, social and productive activities at home and in the community, functional mobility and ambulation. Without skilled Occupational Therapy for Intensive and Management phase Complete Decongestive Therapy (CDT) to address chronic, progressive BLE LE, worsening condition and further functional decline are expected. Emphasis of treatment will be on ADLs retraining and caregiver education to support Pt through recent disabilities.   OT Frequency 3x / week   OT Duration 12 weeks   OT Treatment/Interventions Self-care/ADL training;Therapeutic exercise;Functional Mobility  Training;Patient/family education;Manual Therapy;Energy conservation;Manual lymph drainage;Other (comment);DME and/or AE instruction;Compression bandaging;Therapeutic activities      Patient will benefit from skilled therapeutic intervention in order to improve the following deficits and impairments:  Abnormal gait, Decreased skin integrity, Decreased knowledge of precautions, Improper body mechanics, Decreased activity tolerance, Decreased endurance, Decreased knowledge of use of DME, Decreased strength, Impaired flexibility, Decreased balance, Decreased mobility, Difficulty walking, Decreased range of motion, Increased edema, Pain, Impaired UE functional use  Visit Diagnosis: Lymphedema    Problem List Patient Active Problem List   Diagnosis Date Noted  . Vitamin D deficiency 08/20/2014  . Secondary hyperparathyroidism ([place]) 08/20/2014    Andrey Spearman, MS, OTR/L, [facility] 08/05/16 9:56 AM  [place] MAIN [facility] SERVICES [address] [place], [place], [address] Phone: [phone]   Fax:  [phone]  Name: [name] MRN: [id number] Date of Birth: 03/12/1951

## 2016-08-10 ENCOUNTER — Ambulatory Visit: Payer: 59 | Admitting: Occupational Therapy

## 2016-08-10 DIAGNOSIS — I890 Lymphedema, not elsewhere classified: Secondary | ICD-10-CM | POA: Diagnosis not present

## 2016-08-10 NOTE — Therapy (Signed)
[facility] MAIN REHAB SERVICES [address] [place], [place], [address] Phone: [phone]   Fax:  [phone]  Occupational Therapy Treatment  Patient Details  Name: [name] MRN: [id number] Date of Birth: 04/30/1951 Referring Provider: Robert Reade, MD  Encounter Date: 08/10/2016      OT End of Session - 08/10/16 1603    Visit Number 7   Number of Visits 36   Date for OT Re-Evaluation 10/13/16   OT Start Time 1105   OT Stop Time 1220   OT Time Calculation (min) 75 min   Activity Tolerance Patient tolerated treatment well;No increased pain   Behavior During Therapy WFL for tasks assessed/performed      Past Medical History:  Diagnosis Date  . Peptic ulcer     Past Surgical History:  Procedure Laterality Date  . BREAST SURGERY    . CHOLECYSTECTOMY    . KNEE SURGERY    . mini gastric bypass    . SHOULDER SURGERY      There were no vitals filed for this visit.      Subjective Assessment - 08/10/16 1524    Subjective  Pt presents for OT visit 7 to address BLE lipo-lymphedema. She is accompanied by her supportive spouse, [name]. Pt states that her spouse has been didligent with MLD and compression wrapping daily since last visit, and she had used Flexitouch repeatedly over the holiday break.  Through discussion during treatment session Pt reported HOS routine that may be significant contributing obstacle to reducing stubborn BLE and  superpubic swelling.    Patient is accompained by: Family member   Pertinent History BLE lipo-lymphedema. Treated w/ Intensive and Management Phase CDT by this OT in 2016 and 2017 w/ excellent results; decreased balance, high fall risk, hx vertigo, decreased functional grasp L hand; decreased BLE mm strength; decreased functional LY   Limitations BLE leg pain and swelling; decreased standing tolerance; difficulty walking; hx frequent falls; decreased balance; hx vestibular problems; recent L humeral  head fracture; dependent self care 2/2 decreased functional LUE use; decreased grasp bilaterally 2/2 OA and hx fractures of wrist;    Patient Stated Goals get leg swelling back under control and find some kind of garments my husband and I are able to get on and off   Pain Onset More than a month ago                      OT Treatments/Exercises (OP) - 08/10/16 0001      ADLs   ADL Education Given Yes     Manual Therapy   Manual Therapy Edema management;Manual Lymphatic Drainage (MLD);Compression Bandaging   Edema Management skin care w/ low pH castor oil during MLD   Manual Lymphatic Drainage (MLD) LLE Manual lymph drainage to (MLD) in supine utilizing ipsilateral inguinal-axillary anastamosis, deep abdominal lymphatics via diaphragmatic breathing  and functional groin LNs as is customary to mobilize cancer related lower extremity LE. Sequence included bilateral "short neck" sequence, J strokes to sub and supraclavicular LN, deep abdominal pathways via effective breathing, (no deep strokes to colon or abdomen 2/2 chronic diarrhea);  functional inguinal LN, L lower extremity- proximal to distal -w/ emphasis on thigh and medial knee bottleneck. Performed light fibrosis technique to maleoli and distal  legs to address fatty fibrosis. Good tolerance.   Compression Bandaging LLE gradient compression wraps applied circumferentially in gradient configuration from toes to groin as follows: custom toe wrap using 1 and   2" co-wrap under cotton stockinett from toes to groin; 8 cm x 1 to foot and ankle, 10 cm x 1, then 12 cm x 2- all layered over .04 x 10 cm and 12 cm Rosidol Soft foam from A-G.                OT Education - 08/10/16 1528    Education provided Yes   Person(s) Educated (P)  Patient;Spouse   Methods (P)  Explanation   Comprehension (P)  Verbalized understanding;Need further instruction             OT Long Term Goals - 07/17/16 0859      OT LONG TERM GOAL #1    Title Pt able to correctly apply gradient compression wraps from toes to groiin with Max assistance from caregiver within 2 weeks for optimal limb volume reduction and LE self management over time.- Pt is independent w/ compression wraps. No CG assistance required.   Baseline dependent   Time 2   Period Weeks   Status New     OT LONG TERM GOAL #2   Title Pt to achieve 10% limb volume reductions bilaterally by DC to limit lymphedema (LE) progressio, to limit infection risk, to decrease falls risk, and to improve functional mobility and ambulation.-Partially met on LLE w/ 13.67% limb volume reduction measured today.   Baseline dependent   Time 12   Period Weeks   Status New     OT LONG TERM GOAL #3   Title Pt MAX assist and at least 85% compliant with all LE self-care protocols, including simple self-manual lymphatic drainage (MLD), skin care, lymphatic pumping ther ex, and donning/ doffing progression garments.- Pt still learning protocols but shows excellent progress with skin care, wraps, simple self MLD, and ther ex.   Baseline dependent   Time 12   Period Weeks   Status New     OT LONG TERM GOAL #4   Title Lymphedema (LE) management/ self-care:  Pt to tolerate daily compression wraps, garments and devices in keeping w/ prescribed wear regime within 1 week of issue date to progress and retain clinical and functional gains and to limit LE progression.   Baseline dependent   Time 12   Period Weeks   Status New     OT LONG TERM GOAL #5   Title During Management Phase CDT Pt to sustain limb volume reductions achieved during Intensive Phase CDT within 5% utilizing LE self-care protocols, appropriate compression garments/ devices, and moderate caregiver assistance.   Baseline dependent   Time 6   Period Months   Status New               Plan - 08/10/16 1605    Clinical Impression Statement Pt expresssing worry and frustration re seemingly slow response to treatment to decrease  leg swelling and tissue density. OT confirmed Pt's assessment, but reminded her that she's been far less mobile after recent fall and arm fracture, and that LE were most likely also traumatised. During the discussion Pt stated that she gets very cold at night since losing weight and she sleeps under an electric blanket turned e highest heat setting throughout the night, even during the summer. OT reveied LE precautions for avoining excessive heat as it exacerbates llymphedema by overworking an already compromised lymphatic system. Pt advised to stop using the electric blanket, wear warmer bedclothes and socks to bed, and try using a down comforter for extra warmth. Pt agreed with plan.     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 Exacerbation is due to a recent fall also resulting in L humeral head fracture. Due to recent changes in her medical status Pt is currently unable to perform LE self-care, including simple self-MLD, skin care, compression therapy, and therapeutic exercise without maximum assistance. These elements of LE self-care are essential for controlling limb swelling, preventing progression, and decreasing falls and infection risk. Despite successful management to date with assistance from her supportive spouse, recent change in medical and functional status pose significant obstacles to basic and instrumental ADLs performance, participation in leisure, social and productive activities at home and in the community, functional mobility and ambulation. Without skilled Occupational Therapy for Intensive and Management phase Complete Decongestive Therapy (CDT) to address chronic, progressive BLE LE, worsening condition and further functional decline are expected. Emphasis of treatment will be on ADLs retraining and caregiver education to support Pt  through recent disabilities.   OT Frequency 3x / week   OT Duration 12 weeks   OT Treatment/Interventions Self-care/ADL training;Therapeutic exercise;Functional Mobility Training;Patient/family education;Manual Therapy;Energy conservation;Manual lymph drainage;Other (comment);DME and/or AE instruction;Compression bandaging;Therapeutic activities      Patient will benefit from skilled therapeutic intervention in order to improve the following deficits and impairments:  Abnormal gait, Decreased skin integrity, Decreased knowledge of precautions, Improper body mechanics, Decreased activity tolerance, Decreased endurance, Decreased knowledge of use of DME, Decreased strength, Impaired flexibility, Decreased balance, Decreased mobility, Difficulty walking, Decreased range of motion, Increased edema, Pain, Impaired UE functional use  Visit Diagnosis: Lymphedema, not elsewhere classified    Problem List Patient Active Problem List   Diagnosis Date Noted  . Vitamin D deficiency 08/20/2014  . Secondary hyperparathyroidism ([place]) 08/20/2014    Andrey Spearman, MS, OTR/L, [facility] 08/10/16 4:12 PM  [place] MAIN [facility] SERVICES [address] [place], [place], [address] Phone: [phone]   Fax:  [phone]  Name: [name] MRN: [id number] Date of Birth: Nov 12, 1950

## 2016-08-10 NOTE — Patient Instructions (Signed)
08/10/16: Do NOT use electric blanket in bed at night. Instead of applying external heat to legs, which causes LE exacerbation, wear warmer bedclothes and use a down comforter for extra warmth because you are so cold natured.

## 2016-08-17 ENCOUNTER — Ambulatory Visit: Payer: 59 | Attending: Family Medicine | Admitting: Occupational Therapy

## 2016-08-17 DIAGNOSIS — I890 Lymphedema, not elsewhere classified: Secondary | ICD-10-CM

## 2016-08-17 NOTE — Therapy (Signed)
[place] MAIN [facility] SERVICES [address] [place], [place], [address] Phone: [phone]   Fax:  [phone]  Occupational Therapy Treatment  Patient Details  Name: [name] MRN: [id number] Date of Birth: 09-30-1950 Referring Provider: Maury Dus, MD  Encounter Date: 08/17/2016      OT End of Session - 08/17/16 1722    Visit Number 8   Number of Visits 36   Date for OT Re-Evaluation 10/13/16   OT Start Time 1114   OT Stop Time 5449   OT Time Calculation (min) 60 min   Activity Tolerance Patient tolerated treatment well;No increased pain   Behavior During Therapy WFL for tasks assessed/performed      Past Medical History:  Diagnosis Date  . Peptic ulcer     Past Surgical History:  Procedure Laterality Date  . BREAST SURGERY    . CHOLECYSTECTOMY    . KNEE SURGERY    . mini gastric bypass    . SHOULDER SURGERY      There were no vitals filed for this visit.      Subjective Assessment - 08/17/16 1719    Subjective  Pt presents for OT visit 8 to address BLE lipo-lymphedema. She is accompanied by her supportive spouse, Richardson Landry. Pt reports that she stopped using electric blanket after last visit and believes her legs are a little bit better.   Patient is accompained by: Family member   Pertinent History BLE lipo-lymphedema. Treated w/ Intensive and Management Phase CDT by this OT in 2016 and 2017 w/ excellent results; decreased balance, high fall risk, hx vertigo, decreased functional grasp L hand; decreased BLE mm strength; decreased functional LY   Limitations BLE leg pain and swelling; decreased standing tolerance; difficulty walking; hx frequent falls; decreased balance; hx vestibular problems; recent L humeral head fracture; dependent self care 2/2 decreased functional LUE use; decreased grasp bilaterally 2/2 OA and hx fractures of wrist;    Patient Stated Goals get leg swelling back under control and find some kind of  garments my husband and I are able to get on and off   Pain Onset More than a month ago                      OT Treatments/Exercises (OP) - 08/17/16 0001      Manual Therapy   Manual Therapy Edema management;Manual Lymphatic Drainage (MLD);Compression Bandaging   Edema Management skin care w/ low pH castor oil during MLD   Manual Lymphatic Drainage (MLD) LLE Manual lymph drainage to (MLD) in supine utilizing ipsilateral inguinal-axillary anastamosis, deep abdominal lymphatics via diaphragmatic breathing  and functional groin LNs as is customary to mobilize cancer related lower extremity LE. Sequence included bilateral "short neck" sequence, J strokes to sub and supraclavicular LN, deep abdominal pathways via effective breathing, (no deep strokes to colon or abdomen 2/2 chronic diarrhea);  functional inguinal LN, L lower extremity- proximal to distal -w/ emphasis on thigh and medial knee bottleneck. Performed light fibrosis technique to [facility] and distal  legs to address fatty fibrosis. Good tolerance.   Compression Bandaging LLE gradient compression wraps applied circumferentially in gradient configuration from toes to groin as follows: custom toe wrap using 1 and 2" co-wrap under cotton stockinett from toes to groin; 8 cm x 1 to foot and ankle, 10 cm x 1, then 12 cm x 2- all layered over .04 x 10 cm and 12 cm Rosidol Soft foam from A-G.  OT Education - 08/17/16 1721    Education provided Yes   Education Details Emphasis of Pt and caregiver edu on LE self care   Person(s) Educated Patient;Spouse   Methods Explanation;Demonstration   Comprehension Verbalized understanding;Need further instruction             OT Long Term Goals - 07/17/16 0859      OT LONG TERM GOAL #1   Title Pt able to correctly apply gradient compression wraps from toes to groiin with Max assistance from caregiver within 2 weeks for optimal limb volume reduction and LE self  management over time.- Pt is independent w/ compression wraps. No CG assistance required.   Baseline dependent   Time 2   Period Weeks   Status New     OT LONG TERM GOAL #2   Title Pt to achieve 10% limb volume reductions bilaterally by DC to limit lymphedema (LE) progressio, to limit infection risk, to decrease falls risk, and to improve functional mobility and ambulation.-Partially met on LLE w/ 13.67% limb volume reduction measured today.   Baseline dependent   Time 12   Period Weeks   Status New     OT LONG TERM GOAL #3   Title Pt MAX assist and at least 85% compliant with all LE self-care protocols, including simple self-manual lymphatic drainage (MLD), skin care, lymphatic pumping ther ex, and donning/ doffing progression garments.- Pt still learning protocols but shows excellent progress with skin care, wraps, simple self MLD, and ther ex.   Baseline dependent   Time 12   Period Weeks   Status New     OT LONG TERM GOAL #4   Title Lymphedema (LE) management/ self-care:  Pt to tolerate daily compression wraps, garments and devices in keeping w/ prescribed wear regime within 1 week of issue date to progress and retain clinical and functional gains and to limit LE progression.   Baseline dependent   Time 12   Period Weeks   Status New     OT LONG TERM GOAL #5   Title During Management Phase CDT Pt to sustain limb volume reductions achieved during Intensive Phase CDT within 5% utilizing LE self-care protocols, appropriate compression garments/ devices, and moderate caregiver assistance.   Baseline dependent   Time 6   Period Months   Status New               Plan - 08/17/16 1723    Clinical Impression Statement B thighs are palpably less dense and swollen  since last visit. RLE is visibly decreased in volume as in LLE, but LLE is less so. Pt tolerated MLD, skin care and compression to LLE without difficulty today. Next visit is last OT session . Review all Pt and  caregiver edu for LE self care next visit. No local certified CLTs in area of [place] where Pt is moving for the next year while  they build their home.   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 Exacerbation is due to a recent fall also resulting in L humeral head fracture. Due to recent changes in her medical status Pt is currently unable to perform LE self-care, including simple self-MLD, skin care, compression therapy, and therapeutic exercise without maximum assistance. These elements of LE self-care are essential for controlling limb swelling, preventing progression, and decreasing falls and infection risk. Despite successful management to date  with assistance from her supportive spouse, recent change in medical and functional status pose significant obstacles to basic and instrumental ADLs performance, participation in leisure, social and productive activities at home and in the community, functional mobility and ambulation. Without skilled Occupational Therapy for Intensive and Management phase Complete Decongestive Therapy (CDT) to address chronic, progressive BLE LE, worsening condition and further functional decline are expected. Emphasis of treatment will be on ADLs retraining and caregiver education to support Pt through recent disabilities.   OT Frequency 3x / week   OT Duration 12 weeks   OT Treatment/Interventions Self-care/ADL training;Therapeutic exercise;Functional Mobility Training;Patient/family education;Manual Therapy;Energy conservation;Manual lymph drainage;Other (comment);DME and/or AE instruction;Compression bandaging;Therapeutic activities      Patient will benefit from skilled therapeutic intervention in order to improve the following deficits and impairments:  Abnormal gait, Decreased skin integrity, Decreased knowledge  of precautions, Improper body mechanics, Decreased activity tolerance, Decreased endurance, Decreased knowledge of use of DME, Decreased strength, Impaired flexibility, Decreased balance, Decreased mobility, Difficulty walking, Decreased range of motion, Increased edema, Pain, Impaired UE functional use  Visit Diagnosis: Lymphedema, not elsewhere classified    Problem List Patient Active Problem List   Diagnosis Date Noted  . Vitamin D deficiency 08/20/2014  . Secondary hyperparathyroidism ([place]) 08/20/2014    Andrey Spearman, MS, OTR/L, [facility] 08/17/16 5:26 PM  [place] MAIN [facility] SERVICES [address] [place], [place], [address] Phone: [phone]   Fax:  [phone]  Name: Najwa A Burns MRN: [id number] Date of Birth: 1950/09/18

## 2016-08-17 NOTE — Patient Instructions (Signed)
LE instructions and precautions as established- see initial eval.   

## 2016-08-19 ENCOUNTER — Ambulatory Visit: Payer: 59 | Admitting: Occupational Therapy

## 2016-08-24 ENCOUNTER — Encounter: Payer: 59 | Admitting: Occupational Therapy

## 2016-08-26 ENCOUNTER — Encounter: Payer: 59 | Admitting: Occupational Therapy

## 2016-08-31 ENCOUNTER — Encounter: Payer: 59 | Admitting: Occupational Therapy

## 2016-09-02 ENCOUNTER — Encounter: Payer: 59 | Admitting: Occupational Therapy

## 2016-09-09 ENCOUNTER — Encounter: Payer: 59 | Admitting: Occupational Therapy

## 2016-09-11 ENCOUNTER — Encounter: Payer: 59 | Admitting: Occupational Therapy

## 2016-09-16 ENCOUNTER — Encounter: Payer: 59 | Admitting: Occupational Therapy

## 2016-09-18 ENCOUNTER — Encounter: Payer: 59 | Admitting: Occupational Therapy

## 2016-09-21 ENCOUNTER — Encounter: Payer: 59 | Admitting: Occupational Therapy

## 2016-09-23 ENCOUNTER — Encounter: Payer: 59 | Admitting: Occupational Therapy

## 2016-09-23 DIAGNOSIS — S42209A Unspecified fracture of upper end of unspecified humerus, initial encounter for closed fracture: Secondary | ICD-10-CM | POA: Insufficient documentation

## 2016-09-23 DIAGNOSIS — M779 Enthesopathy, unspecified: Secondary | ICD-10-CM | POA: Insufficient documentation

## 2016-09-25 ENCOUNTER — Ambulatory Visit
Admission: RE | Admit: 2016-09-25 | Discharge: 2016-09-25 | Disposition: A | Discharge status: Home / Self Care | Payer: 59 | Source: Ambulatory Visit | Attending: Physician Assistant | Admitting: Physician Assistant

## 2016-09-25 ENCOUNTER — Encounter: Payer: Self-pay | Admitting: Physician Assistant

## 2016-09-25 ENCOUNTER — Ambulatory Visit (INDEPENDENT_AMBULATORY_CARE_PROVIDER_SITE_OTHER): Payer: 59 | Admitting: Physician Assistant

## 2016-09-25 VITALS — BP 118/72 | HR 80 | Temp 98.2°F | Resp 16 | Ht 63.0 in | Wt 171.0 lb

## 2016-09-25 DIAGNOSIS — M47814 Spondylosis without myelopathy or radiculopathy, thoracic region: Secondary | ICD-10-CM | POA: Insufficient documentation

## 2016-09-25 DIAGNOSIS — R002 Palpitations: Secondary | ICD-10-CM

## 2016-09-25 DIAGNOSIS — M8580 Other specified disorders of bone density and structure, unspecified site: Secondary | ICD-10-CM | POA: Diagnosis not present

## 2016-09-25 DIAGNOSIS — Z8709 Personal history of other diseases of the respiratory system: Secondary | ICD-10-CM

## 2016-09-25 DIAGNOSIS — R0602 Shortness of breath: Secondary | ICD-10-CM

## 2016-09-25 DIAGNOSIS — J4540 Moderate persistent asthma, uncomplicated: Secondary | ICD-10-CM

## 2016-09-25 DIAGNOSIS — J90 Pleural effusion, not elsewhere classified: Secondary | ICD-10-CM | POA: Insufficient documentation

## 2016-09-25 DIAGNOSIS — J449 Chronic obstructive pulmonary disease, unspecified: Secondary | ICD-10-CM | POA: Diagnosis not present

## 2016-09-25 DIAGNOSIS — Z7689 Persons encountering health services in other specified circumstances: Secondary | ICD-10-CM

## 2016-09-25 DIAGNOSIS — I890 Lymphedema, not elsewhere classified: Secondary | ICD-10-CM | POA: Diagnosis not present

## 2016-09-25 NOTE — Progress Notes (Signed)
Patient: [name] Female    DOB: 28-May-1951   66 y.o.   MRN: [id number] Visit Date: 09/25/2016  Today's Provider: Mar Daring, PA-C   Chief Complaint  Patient presents with  . Lymphedema   Subjective:    HPI   Lymphedema Pt has been seeing Stephani Police at [facility] for lymphedema for the last year and a half. Per pt, the lymphedema has an unknown etiology. Pt has to stop seeing Helene Kelp in 08/2016 due to moving to [place], [place]. Pt is moving back to area and needs to establish care.  Pt reports her lymphedema has been worsening over the last 2 weeks since she stopped therapy. Pt reports her swelling has been going to upper legs and abdomen. Pt reports she notices she can lay on her side at night and her breast on the side she is lying on will swell and become hard, but returns to normal once she sits up.. Pt c/o SOB, fatigue. Pt is using Ventolin inhaler, without relief of SOB. Pt has recently noticed heart palpitations. No chest pain.   Allergies  Allergen Reactions  . Lac Bovis Diarrhea  . Prednisone Diarrhea  . Sulfa Antibiotics Itching  . Zithromax [Azithromycin] Diarrhea     Current Outpatient Prescriptions:  .  Cholecalciferol (VITAMIN D3) 5000 UNITS CAPS, Take 2 capsules by mouth daily., Disp: , Rfl:  .  erythromycin ophthalmic ointment, APPLY AS DIRECTED 4 TIMES A DAY, Disp: , Rfl: 0 .  Multiple Vitamin (MULTIVITAMIN) tablet, Take 1 tablet by mouth 3 (three) times daily., Disp: , Rfl:  .  pantoprazole (PROTONIX) 40 MG tablet, Take 40 mg by mouth every morning., Disp: , Rfl: 3 .  Sucralfate (CARAFATE PO), Take by mouth., Disp: , Rfl:  .  Teriparatide, Recombinant, 600 MCG/2.4ML SOLN, Inject 20 mcg into the skin., Disp: , Rfl:  .  VENTOLIN HFA 108 (90 Base) MCG/ACT inhaler, INHALE 2 PUFFS INTO THE LUNGS EVERY 6 HOURS AS NEEDED, Disp: , Rfl: 0 .  calcitRIOL (ROCALTROL) 0.5 MCG capsule, Take 0.5 mcg by mouth daily., Disp: , Rfl: 5  Review of Systems    Constitutional: Positive for activity change and fatigue. Negative for appetite change, chills, diaphoresis, fever and unexpected weight change.  HENT: Negative.   Eyes: Negative.   Respiratory: Positive for shortness of breath. Negative for apnea, cough, choking, chest tightness, wheezing and stridor.   Cardiovascular: Positive for palpitations and leg swelling. Negative for chest pain.  Gastrointestinal: Positive for abdominal distention. Negative for abdominal pain, anal bleeding, blood in stool, constipation, diarrhea, nausea, rectal pain and vomiting.  Endocrine: Negative.   Genitourinary: Negative.   Musculoskeletal: Negative.   Skin: Negative.   Allergic/Immunologic: Negative.   Neurological: Positive for dizziness. Negative for seizures, syncope, facial asymmetry, speech difficulty, weakness, light-headedness, numbness and headaches.  Hematological: Negative for adenopathy. Bruises/bleeds easily.  Psychiatric/Behavioral: Negative.     Social History  Substance Use Topics  . Smoking status: Never Smoker  . Smokeless tobacco: Never Used  . Alcohol use No   Objective:   BP 118/72 (BP Location: Right Arm, Patient Position: Sitting, Cuff Size: Normal)   Pulse 80   Temp 98.2 F (36.8 C) (Oral)   Resp 16   Ht 5\' 3"  (1.6 m)   Wt 171 lb (77.6 kg)   SpO2 98%   BMI 30.29 kg/m   Physical Exam  Constitutional: She appears well-developed and well-nourished. No distress.  Neck: Normal range of motion.  Neck supple. No tracheal deviation present. No thyromegaly present.  Cardiovascular: Normal rate, regular rhythm, normal heart sounds and intact distal pulses.  Exam reveals no gallop and no friction rub.   No murmur heard. Pulmonary/Chest: Effort normal and breath sounds normal. No respiratory distress. She has no wheezes. She has no rales.  Musculoskeletal: She exhibits edema (severe lymphedema noted to lower abdomen).  Lymphadenopathy:    She has no cervical adenopathy.  Skin:  She is not diaphoretic.  Vitals reviewed.      Assessment & Plan:     1. Establishing care with new doctor, encounter for  2. SOB (shortness of breath) EKG revealed NSR with low voltage and rate of  73. Spirometry was then done and showed moderate airway obstruction with an FEV1/FVC ratio being 59%, 76% of predicted, FEV1 was 67% of predicted. Breo inhaler 100-7mcg was given to patient for a 14-day trial. Will also get CXR since patient reports having a pleural effusion noted on a CXR that was done in [facility]. I will f/u pending results. I will also check labs as below and f/u pending results.  - EKG 12-Lead - CBC w/Diff/Platelet - Comprehensive Metabolic Panel (CMET) - TSH - B Nat Peptide - PTH, Intact and Calcium - Vitamin D (25 hydroxy) - Spirometry with Graph - DG Chest 2 View; Future  3. Heart palpitations See above medical treatment plan. - EKG 12-Lead - CBC w/Diff/Platelet - Comprehensive Metabolic Panel (CMET) - TSH - B Nat Peptide - PTH, Intact and Calcium - Vitamin D (25 hydroxy)  4. Lymphedema of both lower extremities Unknown cause, thought to possibly be inherited but patient denies family history. However, she does report having extensive lower abdominal surgery for removal of excess skin (over 20 pounds of skin removed) following weight loss from gastric bypass surgery. She does report the lymphedema started approx one year after that surgery. She is interested in being referred to the lymphedema clinic in [place].  - CBC w/Diff/Platelet - Comprehensive Metabolic Panel (CMET) - TSH - B Nat Peptide - PTH, Intact and Calcium - Vitamin D (25 hydroxy)  5. H/O pleural effusion See above medical treatment plan for #1. - DG Chest 2 View; Future  6. 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 - fluticasone furoate-vilanterol (BREO ELLIPTA) 100-25 MCG/INH AEPB; Inhale 1 puff into the  lungs daily.  Dispense: 1 each; Refill: 0     Patient seen and examined by Mar Daring, PA-C, and note scribed by Renaldo Fiddler, CMA.   Mar Daring, PA-C  [facility]

## 2016-09-25 NOTE — Patient Instructions (Signed)
Lymphedema Introduction Lymphedema is swelling that is caused by the abnormal collection of lymph under the skin. Lymph is fluid from the tissues in your body that travels in the lymphatic system. This system is part of the immune system and includes lymph nodes and lymph vessels. The lymph vessels collect and carry the excess fluid, fats, proteins, and wastes from the tissues of the body to the bloodstream. This system also works to clean and remove bacteria and waste products from the body. Lymphedema occurs when the lymphatic system is blocked. When the lymph vessels or lymph nodes are blocked or damaged, lymph does not drain properly, causing an abnormal buildup of lymph. This leads to swelling in the arms or legs. Lymphedema cannot be cured by medicines, but various methods can be used to help reduce the swelling. What are the causes? There are two types of lymphedema. Primary lymphedema is caused by the absence or abnormality of the lymph vessel at birth. Secondary lymphedema is more common. It occurs when the lymph vessel is damaged or blocked. Common causes of lymph vessel blockage include:  Skin infection, such as cellulitis.  Infection by parasites (filariasis).  Injury.  Cancer.  Radiation therapy.  Formation of scar tissue.  Surgery. What are the signs or symptoms? Symptoms of this condition include:  Swelling of the arm or leg.  A heavy or tight feeling in the arm or leg.  Swelling of the feet, toes, or fingers. Shoes or rings may fit more tightly than before.  Redness of the skin over the affected area.  Limited movement of the affected limb.  Sensitivity to touch or discomfort in the affected limb. How is this diagnosed? This condition may be diagnosed with:  A physical exam.  Medical history.  Bioimpedance spectroscopy. In this test, painless electrical currents are used to measure fluid levels in your body.  Imaging tests, such as:  Lymphoscintigraphy. In  this test, a low dose of a radioactive substance is injected to trace the flow of lymph through the lymph vessels.  MRI.  CT scan.  Duplex ultrasound. This test uses sound waves to produce images of the vessels and the blood flow on a screen.  Lymphangiography. In this test, a contrast dye is injected into the lymph vessel to help show blockages. How is this treated? Treatment for this condition may depend on the cause. Treatment may include:  Exercise. Certain exercises can help fluid move out of the affected limb.  Massage. Gentle massage of the affected limb can help move the fluid out of the area.  Compression. Various methods may be used to apply pressure to the affected limb in order to reduce the swelling.  Wearing compression stockings or sleeves on the affected limb.  Bandaging the affected limb.  Using an external pump that is attached to a sleeve that alternates between applying pressure and releasing pressure.  Surgery. This is usually only done for severe cases. For example, surgery may be done if you have trouble moving the limb or if the swelling does not get better with other treatments. If an underlying condition is causing the lymphedema, treatment for that condition is needed. For example, antibiotic medicines may be used to treat an infection. Follow these instructions at home: Activities  Exercise regularly as directed by your health care provider.  Do not sit with your legs crossed.  When possible, keep the affected limb raised (elevated) above the level of your heart.  Avoid carrying things with an arm that is  affected by lymphedema.  Remember that the affected area is more likely to become injured or infected.  Take these steps to help prevent infection:  Keep the affected area clean and dry.  Protect your skin from cuts. For example, you should use gloves while cooking or gardening. Do not walk barefoot. If you shave the affected area, use an  Copy. General instructions  Take medicines only as directed by your health care provider.  Eat a healthy diet that includes a lot of fruits and vegetables.  Do not wear tight clothes, shoes, or jewelry.  Do not use heating pads over the affected area.  Avoid having blood pressure checked on the affected limb.  Keep all follow-up visits as directed by your health care provider. This is important. Contact a health care provider if:  You continue to have swelling in your limb.  You have a fever.  You have a cut that does not heal.  You have redness or pain in the affected area.  You have new swelling in your limb that comes on suddenly.  You develop purplish spots or sores (lesions) on your limb. Get help right away if:  You have a skin rash.  You have chills or sweats.  You have shortness of breath. This information is not intended to replace advice given to you by your health care provider. Make sure you discuss any questions you have with your health care provider. Document Released: 06/28/2007 Document Revised: 05/07/2016 Document Reviewed: 08/08/2014  2017 Elsevier

## 2016-09-26 LAB — COMPREHENSIVE METABOLIC PANEL
A/G RATIO: 1 — AB (ref 1.2–2.2)
ALT: 52 IU/L — AB (ref 0–32)
AST: 61 IU/L — AB (ref 0–40)
Albumin: 2.3 g/dL — ABNORMAL LOW (ref 3.6–4.8)
Alkaline Phosphatase: 120 IU/L — ABNORMAL HIGH (ref 39–117)
BUN/Creatinine Ratio: 7 — ABNORMAL LOW (ref 12–28)
BUN: 4 mg/dL — ABNORMAL LOW (ref 8–27)
Bilirubin Total: 0.3 mg/dL (ref 0.0–1.2)
CALCIUM: 7.5 mg/dL — AB (ref 8.7–10.3)
CO2: 32 mmol/L — AB (ref 18–29)
CREATININE: 0.57 mg/dL (ref 0.57–1.00)
Chloride: 102 mmol/L (ref 96–106)
GFR calc Af Amer: 114 mL/min/{1.73_m2} (ref >59)
GFR calc non Af Amer: 99 mL/min/{1.73_m2} (ref >59)
Globulin, Total: 2.2 g/dL (ref 1.5–4.5)
Glucose: 76 mg/dL (ref 65–99)
POTASSIUM: 5 mmol/L (ref 3.5–5.2)
Sodium: 143 mmol/L (ref 134–144)
Total Protein: 4.5 g/dL — ABNORMAL LOW (ref 6.0–8.5)

## 2016-09-26 LAB — TSH: TSH: 4.96 u[IU]/mL — ABNORMAL HIGH (ref 0.450–4.500)

## 2016-09-26 LAB — BRAIN NATRIURETIC PEPTIDE: BNP: 62.2 pg/mL (ref 0.0–100.0)

## 2016-09-26 LAB — CBC WITH DIFFERENTIAL/PLATELET
Basophils Absolute: 0 10*3/uL (ref 0.0–0.2)
Basos: 0 %
EOS (ABSOLUTE): 0.1 10*3/uL (ref 0.0–0.4)
Eos: 1 %
Hematocrit: 32.6 % — ABNORMAL LOW (ref 34.0–46.6)
Hemoglobin: 10.8 g/dL — ABNORMAL LOW (ref 11.1–15.9)
Immature Grans (Abs): 0 10*3/uL (ref 0.0–0.1)
Immature Granulocytes: 0 %
Lymphocytes Absolute: 1.2 10*3/uL (ref 0.7–3.1)
Lymphs: 20 %
MCH: 31.8 pg (ref 26.6–33.0)
MCHC: 33.1 g/dL (ref 31.5–35.7)
MCV: 96 fL (ref 79–97)
Monocytes Absolute: 0.4 10*3/uL (ref 0.1–0.9)
Monocytes: 7 %
NEUTROS PCT: 72 %
Neutrophils Absolute: 4.1 10*3/uL (ref 1.4–7.0)
Platelets: 264 10*3/uL (ref 150–379)
RBC: 3.4 x10E6/uL — ABNORMAL LOW (ref 3.77–5.28)
RDW: 15.5 % — AB (ref 12.3–15.4)
WBC: 5.8 10*3/uL (ref 3.4–10.8)

## 2016-09-26 LAB — PTH, INTACT AND CALCIUM: PTH: 49 pg/mL (ref 15–65)

## 2016-09-26 LAB — VITAMIN D 25 HYDROXY (VIT D DEFICIENCY, FRACTURES): Vit D, 25-Hydroxy: 37.6 ng/mL (ref 30.0–100.0)

## 2016-09-27 MED ORDER — FLUTICASONE FUROATE-VILANTEROL 100-25 MCG/INH IN AEPB: 1.0000 | INHALATION_SPRAY | Freq: Every day | RESPIRATORY_TRACT | 0 refills | Status: DC

## 2016-09-28 ENCOUNTER — Encounter: Payer: 59 | Admitting: Occupational Therapy

## 2016-09-28 ENCOUNTER — Other Ambulatory Visit: Payer: Self-pay

## 2016-09-28 DIAGNOSIS — J4540 Moderate persistent asthma, uncomplicated: Secondary | ICD-10-CM

## 2016-09-28 DIAGNOSIS — J449 Chronic obstructive pulmonary disease, unspecified: Secondary | ICD-10-CM

## 2016-09-28 DIAGNOSIS — Z8709 Personal history of other diseases of the respiratory system: Secondary | ICD-10-CM

## 2016-09-28 DIAGNOSIS — I890 Lymphedema, not elsewhere classified: Secondary | ICD-10-CM

## 2016-09-28 MED ORDER — POTASSIUM CHLORIDE ER 10 MEQ PO TBCR: 10.0000 meq | EXTENDED_RELEASE_TABLET | Freq: Every day | ORAL | 3 refills | Status: DC

## 2016-09-28 MED ORDER — FLUTICASONE FUROATE-VILANTEROL 100-25 MCG/INH IN AEPB: 1.0000 | INHALATION_SPRAY | Freq: Every day | RESPIRATORY_TRACT | 5 refills | Status: DC

## 2016-09-28 MED ORDER — FUROSEMIDE 20 MG PO TABS: 20.0000 mg | ORAL_TABLET | Freq: Every day | ORAL | 3 refills | Status: DC

## 2016-09-28 NOTE — Telephone Encounter (Signed)
Patient was advised of results. I re sent her RXs due to she is in [place] now. She states she has been using inhaler and it seems to be helping. Does she need a RX? Also when would you like to see patient back? Also she states you guys discussed lymphedema clinic for patient to see possibly and she wanted to know your opinion on this. Patient states she just started see a lymphedema therapist and do you want her to just follow with them for now or go to clinic?-aa

## 2016-09-28 NOTE — Telephone Encounter (Signed)
I will send in Breo inhaler. She can continue with her therapist for now if she wishes because lymphedema clinic may not change much.   We can f/u in 2-3 months since she is improving.

## 2016-09-28 NOTE — Addendum Note (Signed)
Addended by: Mar Daring on: 09/28/2016 01:45 PM   Modules accepted: Orders

## 2016-09-28 NOTE — Telephone Encounter (Signed)
Patient advised as directed below. She is going to continue with her therapists and she is going to call back to schedule appointment.  Thanks,  -Rahma Meller

## 2016-09-30 ENCOUNTER — Encounter: Payer: 59 | Admitting: Occupational Therapy

## 2016-10-07 ENCOUNTER — Encounter: Payer: 59 | Admitting: Occupational Therapy

## 2016-10-12 ENCOUNTER — Encounter: Payer: 59 | Admitting: Occupational Therapy

## 2017-05-31 ENCOUNTER — Encounter: Payer: Self-pay | Admitting: Emergency Medicine

## 2017-05-31 ENCOUNTER — Ambulatory Visit (INDEPENDENT_AMBULATORY_CARE_PROVIDER_SITE_OTHER): Payer: 59 | Admitting: Family Medicine

## 2017-05-31 ENCOUNTER — Emergency Department: Payer: 59

## 2017-05-31 ENCOUNTER — Inpatient Hospital Stay
Admission: EM | Admit: 2017-05-31 | Discharge: 2017-06-03 | DRG: 378 | Disposition: A | Discharge status: Home / Self Care | Payer: 59 | Attending: Internal Medicine | Admitting: Internal Medicine

## 2017-05-31 ENCOUNTER — Encounter: Payer: Self-pay | Admitting: Family Medicine

## 2017-05-31 VITALS — BP 102/60 | HR 68 | Temp 98.0°F | Resp 16 | Wt 149.0 lb

## 2017-05-31 DIAGNOSIS — K640 First degree hemorrhoids: Secondary | ICD-10-CM | POA: Diagnosis present

## 2017-05-31 DIAGNOSIS — Z800 Family history of malignant neoplasm of digestive organs: Secondary | ICD-10-CM

## 2017-05-31 DIAGNOSIS — I959 Hypotension, unspecified: Secondary | ICD-10-CM | POA: Diagnosis present

## 2017-05-31 DIAGNOSIS — Z881 Allergy status to other antibiotic agents status: Secondary | ICD-10-CM | POA: Diagnosis not present

## 2017-05-31 DIAGNOSIS — E8770 Fluid overload, unspecified: Secondary | ICD-10-CM | POA: Diagnosis present

## 2017-05-31 DIAGNOSIS — Z8711 Personal history of peptic ulcer disease: Secondary | ICD-10-CM | POA: Diagnosis not present

## 2017-05-31 DIAGNOSIS — Z882 Allergy status to sulfonamides status: Secondary | ICD-10-CM | POA: Diagnosis not present

## 2017-05-31 DIAGNOSIS — M810 Age-related osteoporosis without current pathological fracture: Secondary | ICD-10-CM | POA: Diagnosis present

## 2017-05-31 DIAGNOSIS — K922 Gastrointestinal hemorrhage, unspecified: Secondary | ICD-10-CM | POA: Diagnosis present

## 2017-05-31 DIAGNOSIS — K5730 Diverticulosis of large intestine without perforation or abscess without bleeding: Secondary | ICD-10-CM | POA: Diagnosis present

## 2017-05-31 DIAGNOSIS — D62 Acute posthemorrhagic anemia: Secondary | ICD-10-CM | POA: Diagnosis present

## 2017-05-31 DIAGNOSIS — Z888 Allergy status to other drugs, medicaments and biological substances status: Secondary | ICD-10-CM | POA: Diagnosis not present

## 2017-05-31 DIAGNOSIS — K629 Disease of anus and rectum, unspecified: Secondary | ICD-10-CM | POA: Diagnosis not present

## 2017-05-31 DIAGNOSIS — K921 Melena: Principal | ICD-10-CM | POA: Diagnosis present

## 2017-05-31 DIAGNOSIS — K625 Hemorrhage of anus and rectum: Secondary | ICD-10-CM | POA: Diagnosis not present

## 2017-05-31 DIAGNOSIS — Z79899 Other long term (current) drug therapy: Secondary | ICD-10-CM

## 2017-05-31 DIAGNOSIS — K6289 Other specified diseases of anus and rectum: Secondary | ICD-10-CM | POA: Diagnosis present

## 2017-05-31 DIAGNOSIS — R1031 Right lower quadrant pain: Secondary | ICD-10-CM | POA: Diagnosis not present

## 2017-05-31 DIAGNOSIS — Z9884 Bariatric surgery status: Secondary | ICD-10-CM | POA: Diagnosis not present

## 2017-05-31 DIAGNOSIS — K219 Gastro-esophageal reflux disease without esophagitis: Secondary | ICD-10-CM | POA: Diagnosis present

## 2017-05-31 DIAGNOSIS — J45909 Unspecified asthma, uncomplicated: Secondary | ICD-10-CM | POA: Diagnosis present

## 2017-05-31 HISTORY — DX: Gastro-esophageal reflux disease without esophagitis: K21.9

## 2017-05-31 HISTORY — DX: Age-related osteoporosis without current pathological fracture: M81.0

## 2017-05-31 HISTORY — DX: Unspecified asthma, uncomplicated: J45.909

## 2017-05-31 LAB — COMPREHENSIVE METABOLIC PANEL
ALK PHOS: 73 U/L (ref 38–126)
ALT: 23 U/L (ref 14–54)
ANION GAP: 7 (ref 5–15)
AST: 30 U/L (ref 15–41)
Albumin: 3.1 g/dL — ABNORMAL LOW (ref 3.5–5.0)
BUN: 15 mg/dL (ref 6–20)
CALCIUM: 8.5 mg/dL — AB (ref 8.9–10.3)
CO2: 27 mmol/L (ref 22–32)
Chloride: 102 mmol/L (ref 101–111)
Creatinine, Ser: 0.56 mg/dL (ref 0.44–1.00)
GFR calc Af Amer: >60 mL/min (ref >60)
Glucose, Bld: 83 mg/dL (ref 65–99)
POTASSIUM: 4.4 mmol/L (ref 3.5–5.1)
Sodium: 136 mmol/L (ref 135–145)
TOTAL PROTEIN: 5.6 g/dL — AB (ref 6.5–8.1)
Total Bilirubin: 0.7 mg/dL (ref 0.3–1.2)

## 2017-05-31 LAB — TYPE AND SCREEN
ABO/RH(D): O POS
ANTIBODY SCREEN: NEGATIVE

## 2017-05-31 LAB — CBC
HEMATOCRIT: 30.5 % — AB (ref 35.0–47.0)
HEMOGLOBIN: 9.8 g/dL — AB (ref 12.0–16.0)
MCH: 27.6 pg (ref 26.0–34.0)
MCHC: 32.1 g/dL (ref 32.0–36.0)
MCV: 85.9 fL (ref 80.0–100.0)
Platelets: 267 10*3/uL (ref 150–440)
RBC: 3.55 MIL/uL — ABNORMAL LOW (ref 3.80–5.20)
RDW: 16.1 % — AB (ref 11.5–14.5)
WBC: 7.5 10*3/uL (ref 3.6–11.0)

## 2017-05-31 LAB — IFOBT (OCCULT BLOOD): IMMUNOLOGICAL FECAL OCCULT BLOOD TEST: POSITIVE

## 2017-05-31 LAB — HEMOGLOBIN: Hemoglobin: 8.6 g/dL — ABNORMAL LOW (ref 12.0–16.0)

## 2017-05-31 MED ORDER — IOPAMIDOL (ISOVUE-300) INJECTION 61%: 30.0000 mL | Freq: Once | INTRAVENOUS | Status: DC

## 2017-05-31 MED ORDER — PANTOPRAZOLE SODIUM 40 MG PO TBEC
40.0000 mg | DELAYED_RELEASE_TABLET | Freq: Every morning | ORAL | Status: DC
  Administered 2017-06-01 – 2017-06-03 (×3): 40 mg via ORAL
  Filled 2017-05-31 (×3): qty 1

## 2017-05-31 MED ORDER — ALBUTEROL SULFATE HFA 108 (90 BASE) MCG/ACT IN AERS: 2.0000 | INHALATION_SPRAY | Freq: Four times a day (QID) | RESPIRATORY_TRACT | Status: DC | PRN

## 2017-05-31 MED ORDER — IOPAMIDOL (ISOVUE-300) INJECTION 61%
100.0000 mL | Freq: Once | INTRAVENOUS | Status: AC | PRN
  Administered 2017-05-31: 100 mL via INTRAVENOUS

## 2017-05-31 MED ORDER — OXYCODONE HCL 5 MG PO TABS: 5.0000 mg | ORAL_TABLET | ORAL | Status: DC | PRN

## 2017-05-31 MED ORDER — ALBUTEROL SULFATE (2.5 MG/3ML) 0.083% IN NEBU: 2.5000 mg | INHALATION_SOLUTION | Freq: Four times a day (QID) | RESPIRATORY_TRACT | Status: DC | PRN

## 2017-05-31 MED ORDER — ACETAMINOPHEN 650 MG RE SUPP: 650.0000 mg | Freq: Four times a day (QID) | RECTAL | Status: DC | PRN

## 2017-05-31 MED ORDER — ACETAMINOPHEN 325 MG PO TABS: 650.0000 mg | ORAL_TABLET | Freq: Four times a day (QID) | ORAL | Status: DC | PRN

## 2017-05-31 MED ORDER — ONDANSETRON HCL 4 MG/2ML IJ SOLN
4.0000 mg | Freq: Four times a day (QID) | INTRAMUSCULAR | Status: DC | PRN
  Administered 2017-05-31: 21:00:00 4 mg via INTRAVENOUS
  Filled 2017-05-31: qty 2

## 2017-05-31 MED ORDER — SODIUM CHLORIDE 0.9 % IV SOLN
INTRAVENOUS | Status: DC
  Administered 2017-05-31: 18:00:00 via INTRAVENOUS

## 2017-05-31 MED ORDER — ONDANSETRON HCL 4 MG PO TABS: 4.0000 mg | ORAL_TABLET | Freq: Four times a day (QID) | ORAL | Status: DC | PRN

## 2017-05-31 NOTE — ED Provider Notes (Signed)
[facility] Emergency Department Provider Note   ____________________________________________   First MD Initiated Contact with Patient 05/31/17 1426     (approximate)  I have reviewed the triage vital signs and the nursing notes.   HISTORY  Chief Complaint Rectal Bleeding and Abdominal Pain   HPI [name] is a 66 y.o. female Patient complains of right lower qquadrant pain since Friday today is Monday. Patient also reports rectal bleeding with clots when she stools She is a little woozy and lightheaded. She has had rectal bleeding before she has hemorrhoids she has never had it this bad though   Past Medical History:  Diagnosis Date  . Asthma   . GERD (gastroesophageal reflux disease)   . Osteoporosis   . Peptic ulcer     Patient Active Problem List   Diagnosis Date Noted  . GI bleed 05/31/2017  . Rectal mass 05/31/2017  . Asthma 05/31/2017  . GERD (gastroesophageal reflux disease) 05/31/2017  . Vitamin D deficiency 08/20/2014  . Secondary hyperparathyroidism ([place]) 08/20/2014    Past Surgical History:  Procedure Laterality Date  . BREAST SURGERY    . CHOLECYSTECTOMY    . KNEE SURGERY    . mini gastric bypass    . SHOULDER SURGERY    . TONSILLECTOMY      Prior to Admission medications   Medication Sig Start Date End Date Taking? Authorizing Provider  calcitRIOL (ROCALTROL) 0.5 MCG capsule Take 0.5 mcg by mouth daily. 08/30/16   [provider]  Cholecalciferol (VITAMIN D3) 5000 UNITS CAPS Take 2 capsules by mouth daily.    [provider]  fluticasone furoate-vilanterol (BREO ELLIPTA) 100-25 MCG/INH AEPB Inhale 1 puff into the lungs daily. Patient not taking: Reported on 05/31/2017 09/28/16   Mar Daring, PA-C  furosemide (LASIX) 20 MG tablet Take 1 tablet (20 mg total) by mouth daily. Patient not taking: Reported on 05/31/2017 09/28/16   Mar Daring, PA-C  Multiple Vitamin (MULTIVITAMIN) tablet  Take 1 tablet by mouth 3 (three) times daily.    [provider]  pantoprazole (PROTONIX) 40 MG tablet Take 40 mg by mouth every morning. 08/17/16   [provider]  potassium chloride (K-DUR) 10 MEQ tablet Take 1 tablet (10 mEq total) by mouth daily. Patient not taking: Reported on 05/31/2017 09/28/16   Mar Daring, PA-C  Sucralfate (CARAFATE PO) Take by mouth.    [provider]  Teriparatide, Recombinant, 600 MCG/2.4ML SOLN Inject 20 mcg into the skin.    [provider]  VENTOLIN HFA 108 (90 Base) MCG/ACT inhaler INHALE 2 PUFFS INTO THE LUNGS EVERY 6 HOURS AS NEEDED 08/21/16   [provider]  VITAMIN A PO Take by mouth.    [provider]  VITAMIN E PO Take by mouth.    [provider]    Allergies Lac bovis; Prednisone; Sulfa antibiotics; and Zithromax [azithromycin]  Family History  Problem Relation Age of Onset  . Cancer Other   . Hypertension Other   . Stroke Other   . Diabetes Other   . Heart attack Other   . Obesity Other     Social History Social History  Substance Use Topics  . Smoking status: Never Smoker  . Smokeless tobacco: Never Used  . Alcohol use No    Review of Systems  Constitutional: No fever/chills Eyes: No visual changes. ENT: No sore throat. Cardiovascular: Denies chest pain. Respiratory: Denies shortness of breath. Gastrointestinal: see history of present illness  Genitourinary: Negative for dysuria. Musculoskeletal: Negative for back pain. Skin: Negative for rash. Neurological: Negative for headaches, focal weakness  ____________________________________________   PHYSICAL EXAM:  VITAL SIGNS: ED Triage Vitals  Enc Vitals Group     BP 05/31/17 1143 118/67     Pulse Rate 05/31/17 1143 73     Resp 05/31/17 1143 18     Temp 05/31/17 1143 98.6 F (37 C)     Temp Source 05/31/17 1143 Oral     SpO2 05/31/17 1143 100 %     Weight 05/31/17 1143 149 lb (67.6 kg)     Height  05/31/17 1143 5\' 4"  (1.626 m)     Head Circumference --      Peak Flow --      Pain Score 05/31/17 1147 3     Pain Loc --      Pain Edu? --      Excl. in [place]? --     Constitutional: Alert and oriented. Well appearing and in no acute distress. Eyes: Conjunctivae are normal. Head: Atraumatic. Nose: No congestion/rhinnorhea. Mouth/Throat: Mucous membranes are moist.  Oropharynx non-erythematous. Neck: No stridor. Cardiovascular: Normal rate, regular rhythm. Grossly normal heart sounds.  Good peripheral circulation. Respiratory: Normal respiratory effort.  No retractions. Lungs CTAB. Gastrointestinal: Soft tender toPalpation percussion in right lower quadrantNo distention. No abdominal bruits. No CVA tenderness. {Genitourinary: rectal: No obvious bleeding source. Hemoccult positive. *Musculoskeletal: No lower extremity tenderness nor edema.  No joint effusions. Neurologic:  Normal speech and language. No gross focal neurologic deficits are appreciated Skin:  Skin is warm, dry and intact. No rash noted. Psychiatric: Mood and affect are normal. Speech and behavior are normal.  ____________________________________________   LABS (all labs ordered are listed, but only abnormal results are displayed)  Labs Reviewed  COMPREHENSIVE METABOLIC PANEL - Abnormal; Notable for the following:       Result Value   Calcium 8.5 (*)    Total Protein 5.6 (*)    Albumin 3.1 (*)    All other components within normal limits  CBC - Abnormal; Notable for the following:    RBC 3.55 (*)    Hemoglobin 9.8 (*)    HCT 30.5 (*)    RDW 16.1 (*)    All other components within normal limits  POC OCCULT BLOOD, ED  TYPE AND SCREEN   ____________________________________________  EKG   ____________________________________________  RADIOLOGY  ____________________________________________   PROCEDURES  Procedure(s) performed:   Procedures  Critical Care performed:    ____________________________________________   INITIAL IMPRESSION / ASSESSMENT AND PLAN / ED COURSE  Pertinent labs & imaging results that were available during my care of the patient were reviewed by me and considered in my medical decision making (see chart for details).    Clinical Course as of May 31 1641  Mon May 31, 2017  1426 Glucose: 83 [PM]    Clinical Course User Index [PM] Nena Polio, MD     ____________________________________________   FINAL CLINICAL IMPRESSION(S) / ED DIAGNOSES  Final diagnoses:  Lower GI bleed  Rectal mass      NEW MEDICATIONS STARTED DURING THIS VISIT:  New Prescriptions   No medications on file     Note:  This document was prepared using Dragon voice recognition software and may include unintentional dictation errors.    Nena Polio, MD 05/31/17 [phone]

## 2017-05-31 NOTE — ED Notes (Signed)
Patient ambulatory to restroom without assistance. Patient with steady gait.

## 2017-05-31 NOTE — Patient Instructions (Signed)
I advised being evaluated in the emergency department for your abdominal pain and bleeding.

## 2017-05-31 NOTE — H&P (Signed)
[facility] - [place] at [facility]   PATIENT NAME: [name]    MR#:  [id number]  DATE OF BIRTH:  [birth year]  DATE OF ADMISSION:  05/31/2017  PRIMARY CARE PHYSICIAN: Margaretann Loveless, PA-C   REQUESTING/REFERRING PHYSICIAN: Darnelle Catalan, MD  CHIEF COMPLAINT:   Chief Complaint  Patient presents with  . Rectal Bleeding  . Abdominal Pain    HISTORY OF PRESENT ILLNESS:  [name]  is a 66 y.o. female who presents with rectal bleeding and lower abdominal pain. Patient's states this started about 4 days ago. She states that she's been having bright red blood mixed in with her stool, with a significant amount of blood wiped off on toilet tissue and in the bowl as well. She's been having 6-7 stools every day, each significant amount of bleeding. She also has had some unintentional weight loss over the past 6 months, greater than 20 pounds. She also complains of lower abdominal pain.CT scan here in the ED did show a rectal mass.  Hospitalists were called for admission and further evaluation  PAST MEDICAL HISTORY:   Past Medical History:  Diagnosis Date  . Asthma   . GERD (gastroesophageal reflux disease)   . Osteoporosis   . Peptic ulcer     PAST SURGICAL HISTORY:   Past Surgical History:  Procedure Laterality Date  . BREAST SURGERY    . CHOLECYSTECTOMY    . KNEE SURGERY    . mini gastric bypass    . SHOULDER SURGERY    . TONSILLECTOMY      SOCIAL HISTORY:   Social History  Substance Use Topics  . Smoking status: Never Smoker  . Smokeless tobacco: Never Used  . Alcohol use No    FAMILY HISTORY:   Family History  Problem Relation Age of Onset  . Cancer Other   . Hypertension Other   . Stroke Other   . Diabetes Other   . Heart attack Other   . Obesity Other     DRUG ALLERGIES:   Allergies  Allergen Reactions  . Lac Bovis Diarrhea  . Prednisone Diarrhea  . Sulfa Antibiotics Itching  . Zithromax [Azithromycin] Diarrhea     MEDICATIONS AT HOME:   Prior to Admission medications   Medication Sig Start Date End Date Taking? Authorizing Provider  calcitRIOL (ROCALTROL) 0.5 MCG capsule Take 0.5 mcg by mouth daily. 08/30/16   [provider]  Cholecalciferol (VITAMIN D3) 5000 UNITS CAPS Take 2 capsules by mouth daily.    [provider]  fluticasone furoate-vilanterol (BREO ELLIPTA) 100-25 MCG/INH AEPB Inhale 1 puff into the lungs daily. Patient not taking: Reported on 05/31/2017 09/28/16   Margaretann Loveless, PA-C  furosemide (LASIX) 20 MG tablet Take 1 tablet (20 mg total) by mouth daily. Patient not taking: Reported on 05/31/2017 09/28/16   Margaretann Loveless, PA-C  Multiple Vitamin (MULTIVITAMIN) tablet Take 1 tablet by mouth 3 (three) times daily.    [provider]  pantoprazole (PROTONIX) 40 MG tablet Take 40 mg by mouth every morning. 08/17/16   [provider]  potassium chloride (K-DUR) 10 MEQ tablet Take 1 tablet (10 mEq total) by mouth daily. Patient not taking: Reported on 05/31/2017 09/28/16   Margaretann Loveless, PA-C  Sucralfate (CARAFATE PO) Take by mouth.    [provider]  Teriparatide, Recombinant, 600 MCG/2.4ML SOLN Inject 20 mcg into the skin.    [provider]  VENTOLIN HFA 108 (90 Base) MCG/ACT inhaler INHALE 2 PUFFS  INTO THE LUNGS EVERY 6 HOURS AS NEEDED 08/21/16   [provider]  VITAMIN A PO Take by mouth.    [provider]  VITAMIN E PO Take by mouth.    [provider]    REVIEW OF SYSTEMS:  Review of Systems  Constitutional: Positive for weight loss. Negative for chills, fever and malaise/fatigue.  HENT: Negative for ear pain, hearing loss and tinnitus.   Eyes: Negative for blurred vision, double vision, pain and redness.  Respiratory: Negative for cough, hemoptysis and shortness of breath.   Cardiovascular: Negative for chest pain, palpitations, orthopnea and leg swelling.  Gastrointestinal:  Positive for abdominal pain and blood in stool. Negative for constipation, diarrhea, nausea and vomiting.  Genitourinary: Negative for dysuria, frequency and hematuria.  Musculoskeletal: Negative for back pain, joint pain and neck pain.  Skin:       No acne, rash, or lesions  Neurological: Negative for dizziness, tremors, focal weakness and weakness.  Endo/Heme/Allergies: Negative for polydipsia. Does not bruise/bleed easily.  Psychiatric/Behavioral: Negative for depression. The patient is not nervous/anxious and does not have insomnia.      VITAL SIGNS:   Vitals:   05/31/17 1143 05/31/17 1430  BP: 118/67 116/79  Pulse: 73 84  Resp: 18   Temp: 98.6 F (37 C)   TempSrc: Oral   SpO2: 100% 99%  Weight: 67.6 kg (149 lb)   Height: 5\' 4"  (1.626 m)    Wt Readings from Last 3 Encounters:  05/31/17 67.6 kg (149 lb)  05/31/17 67.6 kg (149 lb)  09/25/16 77.6 kg (171 lb)    PHYSICAL EXAMINATION:  Physical Exam  Vitals reviewed. Constitutional: She is oriented to person, place, and time. She appears well-developed and well-nourished. No distress.  HENT:  Head: Normocephalic and atraumatic.  Mouth/Throat: Oropharynx is clear and moist.  Eyes: Pupils are equal, round, and reactive to light. EOM are normal. No scleral icterus.  Neck: Normal range of motion. Neck supple. No JVD present. No thyromegaly present.  Cardiovascular: Normal rate, regular rhythm and intact distal pulses.  Exam reveals no gallop and no friction rub.   No murmur heard. Respiratory: Effort normal and breath sounds normal. No respiratory distress. She has no wheezes. She has no rales.  GI: Soft. Bowel sounds are normal. She exhibits no distension. There is tenderness.  Musculoskeletal: Normal range of motion. She exhibits no edema.  No arthritis, no gout  Lymphadenopathy:    She has no cervical adenopathy.  Neurological: She is alert and oriented to person, place, and time. No cranial nerve deficit.  No  dysarthria, no aphasia  Skin: Skin is warm and dry. No rash noted. No erythema.  Psychiatric: She has a normal mood and affect. Her behavior is normal. Judgment and thought content normal.    LABORATORY PANEL:   CBC  Recent Labs Lab 05/31/17 1144  WBC 7.5  HGB 9.8*  HCT 30.5*  PLT 267   ------------------------------------------------------------------------------------------------------------------  Chemistries   Recent Labs Lab 05/31/17 1144  NA 136  K 4.4  CL 102  CO2 27  GLUCOSE 83  BUN 15  CREATININE 0.56  CALCIUM 8.5*  AST 30  ALT 23  ALKPHOS 73  BILITOT 0.7   ------------------------------------------------------------------------------------------------------------------  Cardiac Enzymes No results for input(s): TROPONINI in the last 168 hours. ------------------------------------------------------------------------------------------------------------------  RADIOLOGY:  Ct Abdomen Pelvis W Contrast  Result Date: 05/31/2017 CLINICAL DATA:  Passing large amount of bright red blood per rectum since prior Friday with right lower quadrant pain.  EXAM: CT ABDOMEN AND PELVIS WITH CONTRAST TECHNIQUE: Multidetector CT imaging of the abdomen and pelvis was performed using the standard protocol following bolus administration of intravenous contrast. CONTRAST:  ISOVUE-300 IOPAMIDOL (ISOVUE-300) INJECTION 61% COMPARISON:  March 07, 2015 FINDINGS: Lower chest: No acute abnormality. Hepatobiliary: The liver is normal without focal liver lesion. There is intra and extrahepatic biliary ductal dilatation with common bowel duct measuring 1.2 cm. This is postsurgical. The patient status post prior cholecystectomy. Pancreas: Unremarkable. No pancreatic ductal dilatation or surrounding inflammatory changes. Spleen: Normal in size without focal abnormality. Adrenals/Urinary Tract: Adrenal glands are unremarkable. There is no focal kidney stone or hydronephrosis bilaterally. Left  parapelvic renal cysts is identified. A 1 cm left renal cortical cyst is noted. Bladder is unremarkable. Stomach/Bowel: In the rectum, there is a 2.8 x 3.2 cm heterogeneous enhancing lobulated mass. Stomach is within normal limits. Appendix appears normal. No evidence of bowel wall thickening, distention, or inflammatory changes. Vascular/Lymphatic: Aortic atherosclerosis. No enlarged abdominal or pelvic lymph nodes. Reproductive: Status post hysterectomy. Bilobed cyst is identified in the left adnexa unchanged compared prior exam in 2016. Other: None. Musculoskeletal: Vertebroplasty materials are identified in the lumbar spine. Diffuse osteopenia is noted. IMPRESSION: In the rectum, there is a 2.8 x 3.2 heterogeneous enhancing lobulated mass. Status post prior cholecystectomy with postsurgical intra and extrahepatic biliary ductal dilatation. Stable cystic structure in the left adnexa unchanged compared prior CT. Electronically Signed   By: Sherian Rein M.D.   On: 05/31/2017 15:40    EKG:   Orders placed or performed in visit on 09/25/16  . EKG 12-Lead    IMPRESSION AND PLAN:  Principal Problem:   GI bleed - unclear etiology at this time. Patient states she has a history of hemorrhoids, but has never had bleeding this persistent and this intense, never associated with lower abdominal pain. Hemoglobin seems stable at this time, however we will trend it tonight, get a GI consult Active Problems:   Rectal mass - potentially related to her above problems, with her symptoms of unintentional weight loss there is some concern for possible malignancy, GI consult as above   Asthma - home dose inhalers   GERD (gastroesophageal reflux disease) - home dose PPI  All the records are reviewed and case discussed with ED provider. Management plans discussed with the patient and/or family.  DVT PROPHYLAXIS: Mechanical only  GI PROPHYLAXIS: PPI  ADMISSION STATUS: Inpatient  CODE STATUS: Full Code Status  History    This patient does not have a recorded code status. Please follow your organizational policy for patients in this situation.    Advance Directive Documentation     Most Recent Value  Type of Advance Directive  Healthcare Power of Attorney, Living will  Pre-existing out of facility DNR order (yellow form or pink MOST form)  -  "MOST" Form in Place?  -      TOTAL TIME TAKING CARE OF THIS PATIENT: 45 minutes.   Virat Prather FIELDING 05/31/2017, 4:44 PM  [employer]  [phone]  CC: Primary care physician; Margaretann Loveless, PA-C  Note:  This document was prepared using Dragon voice recognition software and may include unintentional dictation errors.

## 2017-05-31 NOTE — ED Triage Notes (Signed)
Pt in via POV with complaints of passing large amounts of bright red blood rectally since Friday with some RLQ abdominal pain.  Pt with hx of hemorrhoids and ulcers.  Pt appears pale upon arrival, reports dizziness, and light headedness.

## 2017-05-31 NOTE — Progress Notes (Signed)
Patient: [name] Female    DOB: [birth year]   66 y.o.   MRN: [id number] Visit Date: 05/31/2017  Today's Provider: Lavon Paganini, MD   Chief Complaint  Patient presents with  . Rectal Bleeding   Subjective:    Rectal Bleeding   The current episode started 3 to 5 days ago. The onset was sudden. Episode frequency: with BM's. The problem has been unchanged. The stool is described as soft and bloody (blood fills the toilet). Associated symptoms include abdominal pain (RLQ pain), hemorrhoids, nausea and rectal pain. Pertinent negatives include no anorexia, no fever, no diarrhea, no hematemesis, no vomiting, no hematuria, no vaginal bleeding, no chest pain, no headaches, no coughing, no difficulty breathing and no rash. Her past medical history is significant for abdominal surgery ("mini gastric bypass" in October 2003).  Pt has also lost 22 pounds since January, unintentionally. Occurs with every BM - 7-8 times daily (more than baseline) Has had bleeding previously, but not this bad and usually resolves in a few days (previously had fissure and hemorrhoids that caused some bleeding on toilet paper, but didn't fill the toilet) RLQ pain worse with walking/moving.  Described as sharp and stabbing, though does calms to aching when sitting still No change in appetite.  BMs and pain do not change with intake. Describes bleeding as only occurring with BMs and filling the toilet like a period.      Allergies  Allergen Reactions  . Lac Bovis Diarrhea  . Prednisone Diarrhea  . Sulfa Antibiotics Itching  . Zithromax [Azithromycin] Diarrhea     Current Outpatient Prescriptions:  .  calcitRIOL (ROCALTROL) 0.5 MCG capsule, Take 0.5 mcg by mouth daily., Disp: , Rfl: 5 .  Cholecalciferol (VITAMIN D3) 5000 UNITS CAPS, Take 2 capsules by mouth daily., Disp: , Rfl:  .  Multiple Vitamin (MULTIVITAMIN) tablet, Take 1 tablet by mouth 3 (three) times daily., Disp: , Rfl:  .  pantoprazole  (PROTONIX) 40 MG tablet, Take 40 mg by mouth every morning., Disp: , Rfl: 3 .  Teriparatide, Recombinant, 600 MCG/2.4ML SOLN, Inject 20 mcg into the skin., Disp: , Rfl:  .  VITAMIN A PO, Take by mouth., Disp: , Rfl:  .  VITAMIN E PO, Take by mouth., Disp: , Rfl:  .  fluticasone furoate-vilanterol (BREO ELLIPTA) 100-25 MCG/INH AEPB, Inhale 1 puff into the lungs daily. (Patient not taking: Reported on 05/31/2017), Disp: 60 each, Rfl: 5 .  furosemide (LASIX) 20 MG tablet, Take 1 tablet (20 mg total) by mouth daily. (Patient not taking: Reported on 05/31/2017), Disp: 30 tablet, Rfl: 3 .  potassium chloride (K-DUR) 10 MEQ tablet, Take 1 tablet (10 mEq total) by mouth daily. (Patient not taking: Reported on 05/31/2017), Disp: 30 tablet, Rfl: 3 .  Sucralfate (CARAFATE PO), Take by mouth., Disp: , Rfl:  .  VENTOLIN HFA 108 (90 Base) MCG/ACT inhaler, INHALE 2 PUFFS INTO THE LUNGS EVERY 6 HOURS AS NEEDED, Disp: , Rfl: 0  Review of Systems  Constitutional: Positive for unexpected weight change. Negative for activity change, appetite change, diaphoresis and fever.  HENT: Negative.   Respiratory: Negative for cough.   Cardiovascular: Negative for chest pain.  Gastrointestinal: Positive for abdominal pain (RLQ pain), blood in stool, hematochezia, hemorrhoids, nausea and rectal pain. Negative for anorexia, constipation, diarrhea, hematemesis and vomiting.  Genitourinary: Negative for dysuria, flank pain, frequency, hematuria and vaginal bleeding.  Musculoskeletal: Negative.   Skin: Negative for rash.  Neurological: Negative for syncope,  weakness, light-headedness and headaches.  Psychiatric/Behavioral: Negative.     Social History  Substance Use Topics  . Smoking status: Never Smoker  . Smokeless tobacco: Never Used  . Alcohol use No   Objective:   BP 102/60 (BP Location: Left Arm, Patient Position: Sitting, Cuff Size: Normal)   Pulse 68   Temp 98 F (36.7 C) (Oral)   Resp 16   Wt 149 lb (67.6 kg)    BMI 26.39 kg/m  Vitals:   05/31/17 1038  BP: 102/60  Pulse: 68  Resp: 16  Temp: 98 F (36.7 C)  TempSrc: Oral  Weight: 149 lb (67.6 kg)    Physical Exam  Constitutional: She is oriented to person, place, and time. She appears well-developed and well-nourished. No distress.  +pallor  HENT:  Head: Normocephalic and atraumatic.  Eyes: No scleral icterus.  +Conjunctival pallor  Cardiovascular: Normal rate, regular rhythm, normal heart sounds and intact distal pulses.   No murmur heard. Pulmonary/Chest: Effort normal. No respiratory distress. She has no wheezes. She has no rales.  Abdominal: Soft. Bowel sounds are normal. She exhibits no distension. There is tenderness (RLQ). There is no rebound and no guarding.  Genitourinary: Rectal exam shows external hemorrhoid and guaiac positive stool (no gross bleeding noted). Rectal exam shows no fissure, no mass and anal tone normal.  Musculoskeletal: She exhibits edema. She exhibits no deformity.  Neurological: She is alert and oriented to person, place, and time.  Skin: Skin is warm and dry. No rash noted.  Psychiatric: She has a normal mood and affect. Her behavior is normal.  Vitals reviewed.      Assessment & Plan:     1. Hematochezia 2. RLQ abdominal pain - IFOBT POC (occult bld, rslt in office) - concern for lower GI bleed - seems to be large volume so unlikely to be hemorrhoidal - given RLQ pain and profuse bleeding advised workup in ED - called charge RN to make aware - patient will be transported by private vehicle (husband is driving) - she has previously seen [facility] in [place] - likely needs lab work and CT abd/pelvis and likely GI consult - BP is slightly soft but patient is stable at this time      The entirety of the information documented in the History of Present Illness, Review of Systems and Physical Exam were personally obtained by me. Portions of this information were initially documented by Raquel Sarna Ratchford,  CMA and reviewed by me for thoroughness and accuracy.     Lavon Paganini, MD  [facility]

## 2017-06-01 ENCOUNTER — Encounter: Payer: Self-pay | Admitting: Internal Medicine

## 2017-06-01 DIAGNOSIS — K629 Disease of anus and rectum, unspecified: Secondary | ICD-10-CM

## 2017-06-01 DIAGNOSIS — K625 Hemorrhage of anus and rectum: Secondary | ICD-10-CM

## 2017-06-01 LAB — BASIC METABOLIC PANEL
Anion gap: 6 (ref 5–15)
BUN: 13 mg/dL (ref 6–20)
CHLORIDE: 104 mmol/L (ref 101–111)
CO2: 29 mmol/L (ref 22–32)
CREATININE: 0.68 mg/dL (ref 0.44–1.00)
Calcium: 7.8 mg/dL — ABNORMAL LOW (ref 8.9–10.3)
GFR calc Af Amer: >60 mL/min (ref >60)
GFR calc non Af Amer: >60 mL/min (ref >60)
Glucose, Bld: 82 mg/dL (ref 65–99)
Potassium: 4 mmol/L (ref 3.5–5.1)
SODIUM: 139 mmol/L (ref 135–145)

## 2017-06-01 LAB — CBC
HCT: 26.3 % — ABNORMAL LOW (ref 35.0–47.0)
Hemoglobin: 8.6 g/dL — ABNORMAL LOW (ref 12.0–16.0)
MCH: 28.5 pg (ref 26.0–34.0)
MCHC: 32.7 g/dL (ref 32.0–36.0)
MCV: 87.2 fL (ref 80.0–100.0)
PLATELETS: 206 10*3/uL (ref 150–440)
RBC: 3.02 MIL/uL — ABNORMAL LOW (ref 3.80–5.20)
RDW: 16.9 % — AB (ref 11.5–14.5)
WBC: 4.8 10*3/uL (ref 3.6–11.0)

## 2017-06-01 LAB — HEMOGLOBIN: Hemoglobin: 9.2 g/dL — ABNORMAL LOW (ref 12.0–16.0)

## 2017-06-01 MED ORDER — SODIUM CHLORIDE 0.9 % IV SOLN
INTRAVENOUS | Status: DC
  Administered 2017-06-01: 18:00:00 20 mL via INTRAVENOUS

## 2017-06-01 MED ORDER — PEG 3350-KCL-NA BICARB-NACL 420 G PO SOLR
4000.0000 mL | Freq: Once | ORAL | Status: AC
  Administered 2017-06-01: 18:00:00 4000 mL via ORAL
  Filled 2017-06-01 (×2): qty 4000

## 2017-06-01 MED ORDER — SODIUM CHLORIDE 0.9 % IV BOLUS (SEPSIS)
500.0000 mL | Freq: Once | INTRAVENOUS | Status: AC
  Administered 2017-06-01: 05:00:00 500 mL via INTRAVENOUS

## 2017-06-01 MED ORDER — FLUTICASONE FUROATE-VILANTEROL 100-25 MCG/INH IN AEPB
1.0000 | INHALATION_SPRAY | Freq: Every day | RESPIRATORY_TRACT | Status: DC
  Administered 2017-06-01 – 2017-06-03 (×3): 1 via RESPIRATORY_TRACT
  Filled 2017-06-01: qty 28

## 2017-06-01 NOTE — Plan of Care (Signed)
Problem: Education: Goal: Knowledge of [facility] Education information/materials will improve Outcome: Progressing Hypotensive during shift, Dr. Estanislado Pandy paged, received ordered IV NS 500cc.  VSS otherwise.  Denies pain.  Reported nausea, improved w/ PRN IV Zofran 4mg .  No other complaints.  4 bloody BM's during shift.  Hgb 8.6, Dr. Estanislado Pandy aware.  Bed in low position, call bell within reach.  CTM./

## 2017-06-01 NOTE — Progress Notes (Signed)
[facility] [place] NAME: [name]    MR#:  [id number]  DATE OF BIRTH:  Jun 02, 1951  SUBJECTIVE:  CHIEF COMPLAINT:   Chief Complaint  Patient presents with  . Rectal Bleeding  . Abdominal Pain   Still has rectal bleeding. Feels weak with shortness of breath. No abdominal pain  REVIEW OF SYSTEMS:    Review of Systems  Constitutional: Positive for malaise/fatigue. Negative for chills and fever.  HENT: Negative for sore throat.   Eyes: Negative for blurred vision, double vision and pain.  Respiratory: Positive for shortness of breath. Negative for cough, hemoptysis and wheezing.   Cardiovascular: Negative for chest pain, palpitations, orthopnea and leg swelling.  Gastrointestinal: Positive for blood in stool. Negative for abdominal pain, constipation, diarrhea, heartburn, nausea and vomiting.  Genitourinary: Negative for dysuria and hematuria.  Musculoskeletal: Negative for back pain and joint pain.  Skin: Negative for rash.  Neurological: Positive for dizziness and weakness. Negative for sensory change, speech change, focal weakness and headaches.  Endo/Heme/Allergies: Does not bruise/bleed easily.  Psychiatric/Behavioral: Negative for depression. The patient is not nervous/anxious.     DRUG ALLERGIES:   Allergies  Allergen Reactions  . Lac Bovis Diarrhea  . Prednisone Diarrhea  . Sulfa Antibiotics Itching  . Zithromax [Azithromycin] Diarrhea    VITALS:  Blood pressure 101/60, pulse 74, temperature 97.9 F (36.6 C), temperature source Oral, resp. rate 18, height 5\' 4"  (1.626 m), weight 67.6 kg (149 lb), SpO2 99 %.  PHYSICAL EXAMINATION:   Physical Exam  GENERAL:  66 y.o.-year-old patient lying in the bed with no acute distress.  EYES: Pupils equal, round, reactive to light and accommodation. No scleral icterus. Extraocular muscles intact.  HEENT: Head atraumatic, normocephalic. Oropharynx and nasopharynx clear.  NECK:   Supple, no jugular venous distention. No thyroid enlargement, no tenderness.  LUNGS: Normal breath sounds bilaterally, no wheezing, rales, rhonchi. No use of accessory muscles of respiration.  CARDIOVASCULAR: S1, S2 normal. No murmurs, rubs, or gallops.  ABDOMEN: Soft, nontender, nondistended. Bowel sounds present. No organomegaly or mass.  EXTREMITIES: No cyanosis, clubbing or edema b/l.    NEUROLOGIC: Cranial nerves II through XII are intact. No focal Motor or sensory deficits b/l.   PSYCHIATRIC: The patient is alert and oriented x 3.  SKIN: No obvious rash, lesion, or ulcer.   LABORATORY PANEL:   CBC  Recent Labs Lab 06/01/17 0537  WBC 4.8  HGB 8.6*  HCT 26.3*  PLT 206   ------------------------------------------------------------------------------------------------------------------ Chemistries   Recent Labs Lab 05/31/17 1144 06/01/17 0537  NA 136 139  K 4.4 4.0  CL 102 104  CO2 27 29  GLUCOSE 83 82  BUN 15 13  CREATININE 0.56 0.68  CALCIUM 8.5* 7.8*  AST 30  --   ALT 23  --   ALKPHOS 73  --   BILITOT 0.7  --    ------------------------------------------------------------------------------------------------------------------  Cardiac Enzymes No results for input(s): TROPONINI in the last 168 hours. ------------------------------------------------------------------------------------------------------------------  RADIOLOGY:  Ct Abdomen Pelvis W Contrast  Result Date: 05/31/2017 CLINICAL DATA:  Passing large amount of bright red blood per rectum since prior Friday with right lower quadrant pain. EXAM: CT ABDOMEN AND PELVIS WITH CONTRAST TECHNIQUE: Multidetector CT imaging of the abdomen and pelvis was performed using the standard protocol following bolus administration of intravenous contrast. CONTRAST:  158mL ISOVUE-300 IOPAMIDOL (ISOVUE-300) INJECTION 61% COMPARISON:  March 07, 2015 FINDINGS: Lower chest: No acute abnormality. Hepatobiliary: The liver is normal  without focal liver lesion. There is intra and extrahepatic biliary ductal dilatation with common bowel duct measuring 1.2 cm. This is postsurgical. The patient status post prior cholecystectomy. Pancreas: Unremarkable. No pancreatic ductal dilatation or surrounding inflammatory changes. Spleen: Normal in size without focal abnormality. Adrenals/Urinary Tract: Adrenal glands are unremarkable. There is no focal kidney stone or hydronephrosis bilaterally. Left parapelvic renal cysts is identified. A 1 cm left renal cortical cyst is noted. Bladder is unremarkable. Stomach/Bowel: In the rectum, there is a 2.8 x 3.2 cm heterogeneous enhancing lobulated mass. Stomach is within normal limits. Appendix appears normal. No evidence of bowel wall thickening, distention, or inflammatory changes. Vascular/Lymphatic: Aortic atherosclerosis. No enlarged abdominal or pelvic lymph nodes. Reproductive: Status post hysterectomy. Bilobed cyst is identified in the left adnexa unchanged compared prior exam in 2016. Other: None. Musculoskeletal: Vertebroplasty materials are identified in the lumbar spine. Diffuse osteopenia is noted. IMPRESSION: In the rectum, there is a 2.8 x 3.2 heterogeneous enhancing lobulated mass. Status post prior cholecystectomy with postsurgical intra and extrahepatic biliary ductal dilatation. Stable cystic structure in the left adnexa unchanged compared prior CT. Electronically Signed   By: Abelardo Diesel M.D.   On: 05/31/2017 15:40   ASSESSMENT AND PLAN:   * Rectal mass with bleeding Continues  to have blood in stool. No abdominal pain. Hemoglobin slowly trending down. Blood pressure low normal. Repeat Hb now and transfuse if further worsening Discussed with Dr. Vicente Males of GI. I suggested consulting surgery with ongoing bleeding. Discussed with Dr. Rosana Hoes. Patient will need biopsy of the area. If any  Acute worsening will have to discussed with vascular surgery for embolization.  * Acute blood loss  anemia  * DVT prophylaxis with SCDs   All the records are reviewed and case discussed with Care Management/Social Workerr. Management plans discussed with the patient, family and they are in agreement.  CODE STATUS: FULL CODE  DVT Prophylaxis: SCDs  TOTAL TIME TAKING CARE OF THIS PATIENT: 35 minutes.   POSSIBLE D/C IN 1-2 DAYS, DEPENDING ON CLINICAL CONDITION.  Hillary Bow R M.D on 06/01/2017 at 12:03 PM  Between 7am to 6pm - Pager - [phone]  After 6pm go to [email or web address] - password [email or web address] [facility]  Office  [phone]  CC: Primary care physician; Mar Daring, PA-C  Note: This dictation was prepared with Dragon dictation along with smaller phrase technology. Any transcriptional errors that result from this process are unintentional.

## 2017-06-01 NOTE — Consult Note (Addendum)
Jonathon Bellows MD, MRCP(U.K) [address]  [place]  [place], [place] [address]  Main: [phone]  Fax: [phone]  Consultation  Referring Provider:  Dr Darvin Neighbours Primary Care Physician:  Mar Daring, PA-C Primary Gastroenterologist:  None     Reason for Consultation:  GI bleed   Date of Admission :  05/31/2017 Date of Consultation:  06/01/2017         HPI:   [name] is a 66 y.o. female presented to the ER yesterday with rectal bleeding and lower abdominal pain . Large mass seen in the rectum on CT scan of the abdomen. Hb 8.6 with MCV 87.2.    She says she has a family history of colon cancer in her mother, she has had regular colonoscopy , last was 4 years back and was due again next year. Has had on and off rectal bleeding for years but since Friday has been severe, large qty of blood mixed with the stools which has been bright red, unintentional weight loss since April 2018 , last bowel movement at lunch time with lots of blood.  She has a daughter who is 30 yeas old.    Past Medical History:  Diagnosis Date  . Asthma   . GERD (gastroesophageal reflux disease)   . Osteoporosis   . Peptic ulcer     Past Surgical History:  Procedure Laterality Date  . BACK SURGERY    . BREAST SURGERY    . CHOLECYSTECTOMY    . KNEE SURGERY    . mini gastric bypass    . SHOULDER SURGERY    . TONSILLECTOMY      Prior to Admission medications   Medication Sig Start Date End Date Taking? Authorizing Provider  b complex vitamins tablet Take 1 tablet by mouth every other day.   Yes [provider]  calcitRIOL (ROCALTROL) 0.5 MCG capsule Take 0.5 mcg by mouth daily. 08/30/16  Yes [provider]  Cholecalciferol (VITAMIN D3) 5000 UNITS CAPS Take 1 capsule by mouth daily.    Yes [provider]  Multiple Vitamin (MULTIVITAMIN) tablet Take 1 tablet by mouth 2 (two) times daily.    Yes [provider]  pantoprazole (PROTONIX) 40 MG  tablet Take 40 mg by mouth every morning. 08/17/16  Yes [provider]  Teriparatide, Recombinant, 600 MCG/2.4ML SOLN Inject 20 mcg into the skin daily.    Yes [provider]  VENTOLIN HFA 108 (90 Base) MCG/ACT inhaler INHALE 2 PUFFS INTO THE LUNGS EVERY 6 HOURS AS NEEDED 08/21/16  Yes [provider]  VITAMIN A PO Take 1 capsule by mouth daily.    Yes [provider]  VITAMIN E PO Take 1 capsule by mouth daily.    Yes [provider]  fluticasone furoate-vilanterol (BREO ELLIPTA) 100-25 MCG/INH AEPB Inhale 1 puff into the lungs daily. Patient not taking: Reported on 05/31/2017 09/28/16   Mar Daring, PA-C  furosemide (LASIX) 20 MG tablet Take 1 tablet (20 mg total) by mouth daily. Patient not taking: Reported on 05/31/2017 09/28/16   Mar Daring, PA-C  potassium chloride (K-DUR) 10 MEQ tablet Take 1 tablet (10 mEq total) by mouth daily. Patient not taking: Reported on 05/31/2017 09/28/16   Mar Daring, PA-C    Family History  Problem Relation Age of Onset  . Cancer Other   . Hypertension Other   . Stroke Other   . Diabetes Other   . Heart attack Other   .  Obesity Other      Social History  Substance Use Topics  . Smoking status: Never Smoker  . Smokeless tobacco: Never Used  . Alcohol use No    Allergies as of 05/31/2017 - Review Complete 05/31/2017  Allergen Reaction Noted  . Lac bovis Diarrhea 08/15/2014  . Prednisone Diarrhea 08/15/2014  . Sulfa antibiotics Itching 08/15/2014  . Zithromax [azithromycin] Diarrhea 08/15/2014    Review of Systems:    All systems reviewed and negative except where noted in HPI.   Physical Exam:  Vital signs in last 24 hours: Temp:  [97.9 F (36.6 C)-98.6 F (37 C)] 97.9 F (36.6 C) (09/18 0452) Pulse Rate:  [64-91] 71 (09/18 0743) Resp:  [16-20] 18 (09/18 0452) BP: (90-122)/(57-79) 101/60 (09/18 0743) SpO2:  [96 %-100 %] 96 % (09/18 0452) Weight:  [149 lb (67.6 kg)]  149 lb (67.6 kg) (09/17 1143) Last BM Date: 06/01/17 General:   Pleasant, cooperative in NAD Head:  Normocephalic and atraumatic. Eyes:   No icterus.   Conjunctiva pink. PERRLA. Ears:  Normal auditory acuity. Neck:  Supple; no masses or thyroidomegaly Lungs: Respirations even and unlabored. Lungs clear to auscultation bilaterally.   No wheezes, crackles, or rhonchi.  Heart:  Regular rate and rhythm;  Without murmur, clicks, rubs or gallops Abdomen:  Soft, nondistended, nontender. Normal bowel sounds. No appreciable masses or hepatomegaly.  No rebound or guarding.  Rectal:  Not performed. Neurologic:  Alert and oriented x3;  grossly normal neurologically. Skin:  Intact without significant lesions or rashes. Cervical Nodes:  No significant cervical adenopathy. Psych:  Alert and cooperative. Normal affect.  LAB RESULTS:  Recent Labs  05/31/17 1144 05/31/17 2317 06/01/17 0537  WBC 7.5  --  4.8  HGB 9.8* 8.6* 8.6*  HCT 30.5*  --  26.3*  PLT 267  --  206   BMET  Recent Labs  05/31/17 1144 06/01/17 0537  NA 136 139  K 4.4 4.0  CL 102 104  CO2 27 29  GLUCOSE 83 82  BUN 15 13  CREATININE 0.56 0.68  CALCIUM 8.5* 7.8*   LFT  Recent Labs  05/31/17 1144  PROT 5.6*  ALBUMIN 3.1*  AST 30  ALT 23  ALKPHOS 73  BILITOT 0.7   PT/INR No results for input(s): LABPROT, INR in the last 72 hours.  STUDIES: Ct Abdomen Pelvis W Contrast  Result Date: 05/31/2017 CLINICAL DATA:  Passing large amount of bright red blood per rectum since prior Friday with right lower quadrant pain. EXAM: CT ABDOMEN AND PELVIS WITH CONTRAST TECHNIQUE: Multidetector CT imaging of the abdomen and pelvis was performed using the standard protocol following bolus administration of intravenous contrast. CONTRAST:  121mL ISOVUE-300 IOPAMIDOL (ISOVUE-300) INJECTION 61% COMPARISON:  March 07, 2015 FINDINGS: Lower chest: No acute abnormality. Hepatobiliary: The liver is normal without focal liver lesion. There is  intra and extrahepatic biliary ductal dilatation with common bowel duct measuring 1.2 cm. This is postsurgical. The patient status post prior cholecystectomy. Pancreas: Unremarkable. No pancreatic ductal dilatation or surrounding inflammatory changes. Spleen: Normal in size without focal abnormality. Adrenals/Urinary Tract: Adrenal glands are unremarkable. There is no focal kidney stone or hydronephrosis bilaterally. Left parapelvic renal cysts is identified. A 1 cm left renal cortical cyst is noted. Bladder is unremarkable. Stomach/Bowel: In the rectum, there is a 2.8 x 3.2 cm heterogeneous enhancing lobulated mass. Stomach is within normal limits. Appendix appears normal. No evidence of bowel wall thickening, distention, or inflammatory changes. Vascular/Lymphatic: Aortic atherosclerosis. No enlarged  abdominal or pelvic lymph nodes. Reproductive: Status post hysterectomy. Bilobed cyst is identified in the left adnexa unchanged compared prior exam in 2016. Other: None. Musculoskeletal: Vertebroplasty materials are identified in the lumbar spine. Diffuse osteopenia is noted. IMPRESSION: In the rectum, there is a 2.8 x 3.2 heterogeneous enhancing lobulated mass. Status post prior cholecystectomy with postsurgical intra and extrahepatic biliary ductal dilatation. Stable cystic structure in the left adnexa unchanged compared prior CT. Electronically Signed   By: Abelardo Diesel M.D.   On: 05/31/2017 15:40      Impression / Plan:   [name] is a 66 y.o. y/o female admitted with rectal bleeding and abdominal pain of 4 days duration. Ct scan shows a mass in the rectum . Hb on admission 8.6 grams. Family history of colon cancer .   Plan  1. Monitor CBC and transfuse as needed  2. Colonoscopy tomorrow  3. If the mass is a neoplasm may benefit from genetic testing for Lynch syndrome as an outpatient , in the interim if has severe bleeding will need IR emobolization  I have discussed alternative options,  risks & benefits,  which include, but are not limited to, bleeding, infection, perforation,respiratory complication & drug reaction.  The patient agrees with this plan & written consent will be obtained.     Thank you for involving me in the care of this patient.      LOS: 1 day   Jonathon Bellows, MD  06/01/2017, 10:35 AM

## 2017-06-01 NOTE — Consult Note (Signed)
SURGICAL CONSULTATION NOTE (initial) - cpt: [id number]  HISTORY OF PRESENT ILLNESS (HPI):  66 y.o. female presented to [facility] ED yesterday for evaluation of worsened rectal bleeding x 4 days, associated with mild lower abdominal discomfort and intermittent nausea. Patient reports her mother died from colorectal cancer (location/level of primary tumor unknown), after which patient was advised to undergo colonoscopy every 5 years, her most recent of which was 4 years ago, at which time she had not experienced any blood per rectum. Over the past 1 - 2 years, however, she began experiencing small amounts of intermittent spotting of blood per rectum until this past Friday, when she began passing large amounts of bright red blood mixed with stool. She has also recently lost 20 lbs unintentionally over the past 6 months after her weight having been stable since she lost 250 lbs following gastric bypass weight loss surgery at [facility] 13 years ago. She otherwise denies fever/chills, CP, or SOB and has experienced chronic B/L lower extremity lymphedema s/p panniculectomy, for which she wears compression stockings.  Surgery is consulted by medical physician Dr. Darvin Neighbours in this context for evaluation of rectal mass with bleeding per rectum.  PAST MEDICAL HISTORY (PMH):  Past Medical History:  Diagnosis Date  . Asthma   . GERD (gastroesophageal reflux disease)   . Osteoporosis   . Peptic ulcer      PAST SURGICAL HISTORY ([place]):  Past Surgical History:  Procedure Laterality Date  . BACK SURGERY    . BREAST SURGERY    . CHOLECYSTECTOMY    . KNEE SURGERY    . mini gastric bypass    . SHOULDER SURGERY    . TONSILLECTOMY       MEDICATIONS:  Prior to Admission medications   Medication Sig Start Date End Date Taking? Authorizing Provider  b complex vitamins tablet Take 1 tablet by mouth every other day.   Yes [provider]  calcitRIOL (ROCALTROL) 0.5 MCG capsule Take 0.5 mcg by mouth  daily. 08/30/16  Yes [provider]  Cholecalciferol (VITAMIN D3) 5000 UNITS CAPS Take 1 capsule by mouth daily.    Yes [provider]  Multiple Vitamin (MULTIVITAMIN) tablet Take 1 tablet by mouth 2 (two) times daily.    Yes [provider]  pantoprazole (PROTONIX) 40 MG tablet Take 40 mg by mouth every morning. 08/17/16  Yes [provider]  Teriparatide, Recombinant, 600 MCG/2.4ML SOLN Inject 20 mcg into the skin daily.    Yes [provider]  VENTOLIN HFA 108 (90 Base) MCG/ACT inhaler INHALE 2 PUFFS INTO THE LUNGS EVERY 6 HOURS AS NEEDED 08/21/16  Yes [provider]  VITAMIN A PO Take 1 capsule by mouth daily.    Yes [provider]  VITAMIN E PO Take 1 capsule by mouth daily.    Yes [provider]  fluticasone furoate-vilanterol (BREO ELLIPTA) 100-25 MCG/INH AEPB Inhale 1 puff into the lungs daily. Patient not taking: Reported on 05/31/2017 09/28/16   Mar Daring, PA-C  furosemide (LASIX) 20 MG tablet Take 1 tablet (20 mg total) by mouth daily. Patient not taking: Reported on 05/31/2017 09/28/16   Mar Daring, PA-C  potassium chloride (K-DUR) 10 MEQ tablet Take 1 tablet (10 mEq total) by mouth daily. Patient not taking: Reported on 05/31/2017 09/28/16   Mar Daring, PA-C     ALLERGIES:  Allergies  Allergen Reactions  . Lac Bovis Diarrhea  . Prednisone Diarrhea  . Sulfa Antibiotics Itching  . Zithromax [  Azithromycin] Diarrhea     SOCIAL HISTORY:  Social History   Social History  . Marital status: Married    Spouse name: N/A  . Number of children: N/A  . Years of education: N/A   Occupational History  . Not on file.   Social History Main Topics  . Smoking status: Never Smoker  . Smokeless tobacco: Never Used  . Alcohol use No  . Drug use: No  . Sexual activity: No   Other Topics Concern  . Not on file   Social History Narrative  . No narrative on file    The patient  currently resides (home / rehab facility / nursing home): Home The patient normally is (ambulatory / bedbound): Ambulatory   FAMILY HISTORY:  Family History  Problem Relation Age of Onset  . Cancer Other   . Hypertension Other   . Stroke Other   . Diabetes Other   . Heart attack Other   . Obesity Other      REVIEW OF SYSTEMS:  Constitutional: denies weight loss, fever, chills, or sweats  Eyes: denies any other vision changes, history of eye injury  ENT: denies sore throat, hearing problems  Respiratory: denies shortness of breath, wheezing  Cardiovascular: denies chest pain, palpitations  Gastrointestinal: abdominal pain, N/V, and bowel function as per HPI Genitourinary: denies burning with urination or urinary frequency Musculoskeletal: denies any other joint pains or cramps  Skin: denies any other rashes or skin discolorations  Neurological: denies any other headache, dizziness, weakness  Psychiatric: denies any other depression, anxiety   All other review of systems were negative   VITAL SIGNS:  Temp:  [97.9 F (36.6 C)-98.6 F (37 C)] 98.6 F (37 C) (09/18 1451) Pulse Rate:  [64-91] 83 (09/18 1451) Resp:  [18-20] 18 (09/18 0452) BP: (87-122)/(55-76) 87/55 (09/18 1451) SpO2:  [96 %-100 %] 96 % (09/18 1451)     Height: 5\' 4"  (162.6 cm) Weight: 149 lb (67.6 kg) BMI (Calculated): 25.56   INTAKE/OUTPUT:  This shift: No intake/output data recorded.  Last 2 shifts: @IOLAST2SHIFTS @   PHYSICAL EXAM:  Constitutional:  -- Normal body habitus  -- Awake, alert, and oriented x3  Eyes:  -- Pupils equally round and reactive to light  -- No scleral icterus  Ear, nose, and throat:  -- No jugular venous distension  Pulmonary:  -- No crackles  -- Equal breath sounds bilaterally -- Breathing non-labored at rest Cardiovascular:  -- S1, S2 present  -- No pericardial rubs Gastrointestinal:  -- Abdomen soft and non-distended with minimal suprapubic/lower abdominal tenderness to  palpation, no guarding or rebound tenderness -- No abdominal masses appreciated, pulsatile or otherwise  Musculoskeletal and Integumentary:  -- Wounds or skin discoloration: None appreciated -- Extremities: B/L UE and LE FROM, hands and feet warm, B/L lower extremity lymphedema  Neurologic:  -- Motor function: intact and symmetric -- Sensation: intact and symmetric  Labs:  CBC Latest Ref Rng & Units 06/01/2017 06/01/2017 05/31/2017  WBC 3.6 - 11.0 K/uL - 4.8 -  Hemoglobin 12.0 - 16.0 g/dL 9.2(L) 8.6(L) 8.6(L)  Hematocrit 35.0 - 47.0 % - 26.3(L) -  Platelets 150 - 440 K/uL - 206 -   CMP Latest Ref Rng & Units 06/01/2017 05/31/2017 09/25/2016  Glucose 65 - 99 mg/dL 82 83 76  BUN 6 - 20 mg/dL 13 15 4(L)  Creatinine 0.44 - 1.00 mg/dL 0.68 0.56 0.57  Sodium 135 - 145 mmol/L 139 136 143  Potassium 3.5 - 5.1 mmol/L 4.0 4.4  5.0  Chloride 101 - 111 mmol/L 104 102 102  CO2 22 - 32 mmol/L 29 27 32(H)  Calcium 8.9 - 10.3 mg/dL 7.8(L) 8.5(L) 7.5(L)  Total Protein 6.5 - 8.1 g/dL - 5.6(L) 4.5(L)  Total Bilirubin 0.3 - 1.2 mg/dL - 0.7 0.3  Alkaline Phos 38 - 126 U/L - 73 120(H)  AST 15 - 41 U/L - 30 61(H)  ALT 14 - 54 U/L - 23 52(H)   Imaging studies:  CT Abdomen and Pelvis with Contrast (05/31/2017) - personally reviewed with patient bedside In the rectum, there is a 2.8 x 3.2 cm heterogeneous enhancing lobulated mass. Stomach is within normal limits. Appendix appears normal. No evidence of bowel wall thickening, distention, or inflammatory changes.  The liver is normal without focal liver lesion. There is intra and extrahepatic biliary ductal dilatation with common bowel duct measuring 1.2 cm. This is postsurgical. The patient status post prior cholecystectomy.  Assessment/Plan: (ICD-10's: K60.89, K52.5) 66 y.o. 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   - follow-up colonoscopy and biopsy pathology   - monitor serial hemoglobin and transfuse as needed   - medical management of comorbidities as per primary medical team  - if rectal cancer, will need EUS vs MRI for depth assessment/staging and if low rectum malignancy as appears on CT, transfer to [facility] in [place] (patient's expressed preference vs [facility] or [facility]) for colorectal surgery evaluation   - DVT prophylaxis  All of the above findings and recommendations were discussed with the patient and her family, and all of patient's and her family's questions were answered to their expressed satisfaction.  Thank you for the opportunity to participate in this patient's care.   -- Marilynne Drivers Rosana Hoes, MD, [place]: [facility] - Partnering for exceptional care. Office: [phone]

## 2017-06-02 ENCOUNTER — Inpatient Hospital Stay: Payer: 59 | Admitting: Certified Registered"

## 2017-06-02 ENCOUNTER — Encounter
Admission: EM | Disposition: A | Discharge status: Home / Self Care | Payer: Self-pay | Source: Home / Self Care | Attending: Internal Medicine

## 2017-06-02 DIAGNOSIS — K640 First degree hemorrhoids: Secondary | ICD-10-CM

## 2017-06-02 DIAGNOSIS — K922 Gastrointestinal hemorrhage, unspecified: Secondary | ICD-10-CM

## 2017-06-02 DIAGNOSIS — K5730 Diverticulosis of large intestine without perforation or abscess without bleeding: Secondary | ICD-10-CM

## 2017-06-02 HISTORY — PX: COLONOSCOPY WITH PROPOFOL: SHX5780

## 2017-06-02 LAB — HEMOGLOBIN: HEMOGLOBIN: 8.3 g/dL — AB (ref 12.0–16.0)

## 2017-06-02 LAB — HIV ANTIBODY (ROUTINE TESTING W REFLEX): HIV Screen 4th Generation wRfx: NONREACTIVE

## 2017-06-02 SURGERY — COLONOSCOPY WITH PROPOFOL
Anesthesia: General

## 2017-06-02 MED ORDER — LIDOCAINE HCL (PF) 2 % IJ SOLN
INTRAMUSCULAR | Status: AC
  Filled 2017-06-02: qty 2

## 2017-06-02 MED ORDER — LIDOCAINE HCL (CARDIAC) 20 MG/ML IV SOLN
INTRAVENOUS | Status: DC | PRN
  Administered 2017-06-02: 40 mg via INTRAVENOUS

## 2017-06-02 MED ORDER — PROPOFOL 500 MG/50ML IV EMUL
INTRAVENOUS | Status: DC | PRN
  Administered 2017-06-02: 125 ug/kg/min via INTRAVENOUS

## 2017-06-02 MED ORDER — PHENYLEPHRINE HCL 10 MG/ML IJ SOLN
INTRAMUSCULAR | Status: DC | PRN
  Administered 2017-06-02 (×7): 100 ug via INTRAVENOUS

## 2017-06-02 MED ORDER — GUAIFENESIN 100 MG/5ML PO SOLN
5.0000 mL | ORAL | Status: DC | PRN
  Filled 2017-06-02: qty 5

## 2017-06-02 MED ORDER — GUAIFENESIN-DM 100-10 MG/5ML PO SYRP
5.0000 mL | ORAL_SOLUTION | ORAL | Status: DC | PRN
  Filled 2017-06-02 (×2): qty 5

## 2017-06-02 MED ORDER — DEXTROMETHORPHAN POLISTIREX ER 30 MG/5ML PO SUER
30.0000 mg | Freq: Two times a day (BID) | ORAL | Status: DC
  Administered 2017-06-03 (×2): 30 mg via ORAL
  Filled 2017-06-02 (×4): qty 5

## 2017-06-02 MED ORDER — PROPOFOL 10 MG/ML IV BOLUS
INTRAVENOUS | Status: DC | PRN
  Administered 2017-06-02: 20 mg via INTRAVENOUS
  Administered 2017-06-02: 60 mg via INTRAVENOUS

## 2017-06-02 MED ORDER — PROPOFOL 10 MG/ML IV BOLUS
INTRAVENOUS | Status: AC
  Filled 2017-06-02: qty 20

## 2017-06-02 MED ORDER — SODIUM CHLORIDE 0.9 % IV SOLN: INTRAVENOUS | Status: DC

## 2017-06-02 NOTE — Anesthesia Procedure Notes (Signed)
Performed by: Leasia Swann Pre-anesthesia Checklist: Patient identified, Emergency Drugs available, Suction available, Patient being monitored and Timeout performed Patient Re-evaluated:Patient Re-evaluated prior to induction Oxygen Delivery Method: Nasal cannula Induction Type: IV induction       

## 2017-06-02 NOTE — Progress Notes (Signed)
[facility] [place] NAME: Kessler Martinique    MR#:  [id number]  DATE OF BIRTH:  1950-10-11  SUBJECTIVE:  CHIEF COMPLAINT:   Chief Complaint  Patient presents with  . Rectal Bleeding  . Abdominal Pain  Wants to go home as [facility], she understands her bleeding does need to be stopped before she could go, requesting to see if she could take a bath, some chest tightness REVIEW OF SYSTEMS:    Review of Systems  Constitutional: Positive for malaise/fatigue. Negative for chills and fever.  HENT: Negative for sore throat.   Eyes: Negative for blurred vision, double vision and pain.  Respiratory: Positive for shortness of breath. Negative for cough, hemoptysis and wheezing.   Cardiovascular: Positive for chest pain. Negative for palpitations, orthopnea and leg swelling.  Gastrointestinal: Positive for blood in stool. Negative for abdominal pain, constipation, diarrhea, heartburn, nausea and vomiting.  Genitourinary: Negative for dysuria and hematuria.  Musculoskeletal: Negative for back pain and joint pain.  Skin: Negative for rash.  Neurological: Positive for dizziness and weakness. Negative for sensory change, speech change, focal weakness and headaches.  Endo/Heme/Allergies: Does not bruise/bleed easily.  Psychiatric/Behavioral: Negative for depression. The patient is not nervous/anxious.     DRUG ALLERGIES:   Allergies  Allergen Reactions  . Lac Bovis Diarrhea  . Prednisone Diarrhea  . Sulfa Antibiotics Itching  . Zithromax [Azithromycin] Diarrhea    VITALS:  Blood pressure (!) 93/59, pulse 72, temperature 98.7 F (37.1 C), temperature source Oral, resp. rate 16, height 5\' 4"  (1.626 m), weight 67.6 kg (149 lb), SpO2 97 %.  PHYSICAL EXAMINATION:   Physical Exam  GENERAL:  66 y.o.-year-old patient lying in the bed with no acute distress.  EYES: Pupils equal, round, reactive to light  and accommodation. No scleral icterus. Extraocular muscles intact.  HEENT: Head atraumatic, normocephalic. Oropharynx and nasopharynx clear.  NECK:  Supple, no jugular venous distention. No thyroid enlargement, no tenderness.  LUNGS: Normal breath sounds bilaterally, no wheezing, rales, rhonchi. No use of accessory muscles of respiration.  CARDIOVASCULAR: S1, S2 normal. No murmurs, rubs, or gallops.  ABDOMEN: Soft, nontender, nondistended. Bowel sounds present. No organomegaly or mass.  EXTREMITIES: No cyanosis, clubbing or edema b/l.    NEUROLOGIC: Cranial nerves II through XII are intact. No focal Motor or sensory deficits b/l.   PSYCHIATRIC: The patient is alert and oriented x 3.  SKIN: No obvious rash, lesion, or ulcer.  LABORATORY PANEL:   CBC  Recent Labs Lab 06/01/17 0537  06/02/17 0552  WBC 4.8  --   --   HGB 8.6*  < > 8.3*  HCT 26.3*  --   --   PLT 206  --   --   < > = values in this interval not displayed. ------------------------------------------------------------------------------------------------------------------ Chemistries   Recent Labs Lab 05/31/17 1144 06/01/17 0537  NA 136 139  K 4.4 4.0  CL 102 104  CO2 27 29  GLUCOSE 83 82  BUN 15 13  CREATININE 0.56 0.68  CALCIUM 8.5* 7.8*  AST 30  --   ALT 23  --   ALKPHOS 73  --   BILITOT 0.7  --    ------------------------------------------------------------------------------------------------------------------  Cardiac Enzymes No results for input(s): TROPONINI in the last 168 hours. ------------------------------------------------------------------------------------------------------------------  RADIOLOGY:  Ct Abdomen Pelvis W Contrast  Result Date: 05/31/2017 CLINICAL DATA:  Passing large amount of bright red blood per rectum  since prior Friday with right lower quadrant pain. EXAM: CT ABDOMEN AND PELVIS WITH CONTRAST TECHNIQUE: Multidetector CT imaging of the abdomen and pelvis was performed using the  standard protocol following bolus administration of intravenous contrast. CONTRAST:  166mL ISOVUE-300 IOPAMIDOL (ISOVUE-300) INJECTION 61% COMPARISON:  March 07, 2015 FINDINGS: Lower chest: No acute abnormality. Hepatobiliary: The liver is normal without focal liver lesion. There is intra and extrahepatic biliary ductal dilatation with common bowel duct measuring 1.2 cm. This is postsurgical. The patient status post prior cholecystectomy. Pancreas: Unremarkable. No pancreatic ductal dilatation or surrounding inflammatory changes. Spleen: Normal in size without focal abnormality. Adrenals/Urinary Tract: Adrenal glands are unremarkable. There is no focal kidney stone or hydronephrosis bilaterally. Left parapelvic renal cysts is identified. A 1 cm left renal cortical cyst is noted. Bladder is unremarkable. Stomach/Bowel: In the rectum, there is a 2.8 x 3.2 cm heterogeneous enhancing lobulated mass. Stomach is within normal limits. Appendix appears normal. No evidence of bowel wall thickening, distention, or inflammatory changes. Vascular/Lymphatic: Aortic atherosclerosis. No enlarged abdominal or pelvic lymph nodes. Reproductive: Status post hysterectomy. Bilobed cyst is identified in the left adnexa unchanged compared prior exam in 2016. Other: None. Musculoskeletal: Vertebroplasty materials are identified in the lumbar spine. Diffuse osteopenia is noted. IMPRESSION: In the rectum, there is a 2.8 x 3.2 heterogeneous enhancing lobulated mass. Status post prior cholecystectomy with postsurgical intra and extrahepatic biliary ductal dilatation. Stable cystic structure in the left adnexa unchanged compared prior CT. Electronically Signed   By: Abelardo Diesel M.D.   On: 05/31/2017 15:40   ASSESSMENT AND PLAN:    *Chest tightness/shortness of breath Likely due to stress, some fluid overload likely iatrogenic -If her chest pain continues, can consider twelve-lead EKG and chest x-ray, set of  troponin -Monitor  *Hypotension -Likely due to ongoing blood loss  - monitor blood pressure * * Rectal mass with bleeding Continues  to have blood in stool. No abdominal pain. - Dr. Vicente Males planning for colonoscopy today. -Appreciate surgery Dr. Shann Medal input. Patient will need biopsy of the area. If any  Acute worsening will have to discussed with vascular surgery for embolization.  * Acute blood loss anemia: Hemoglobin 8.3 - Hemoglobin slowly trending down. Blood pressure low normal. - transfuse if further worsening and less than 7  * DVT prophylaxis with SCDs   All the records are reviewed and case discussed with Care Management/Social Worker. Management plans discussed with the patient, nursing and they are in agreement.  CODE STATUS: FULL CODE  DVT Prophylaxis: SCDs  TOTAL TIME TAKING CARE OF THIS PATIENT: 35 minutes.   POSSIBLE D/C IN 1-2 DAYS, DEPENDING ON CLINICAL CONDITION.  Max Sane M.D on 06/02/2017 at 8:21 AM  Between 7am to 6pm - Pager - [phone]  After 6pm go to [email or web address] - password [email or web address] [facility]  Office  [phone]  CC: Primary care physician; Mar Daring, PA-C  Note: This dictation was prepared with Dragon dictation along with smaller phrase technology. Any transcriptional errors that result from this process are unintentional.

## 2017-06-02 NOTE — Op Note (Signed)
[facility] Patient Name: [name] Procedure Date: 06/02/2017 10:01 AM MRN: [id number] Account #: [id number] Date of Birth: Mar 09, 1951 Admit Type: Inpatient Age: 66 Room: [facility] ENDO ROOM 4 Gender: Female Note Status: Finalized Procedure:            Colonoscopy Indications:          Hematochezia Providers:            Jonathon Bellows MD, MD Referring MD:         Mar Daring (Referring MD) Medicines:            Monitored Anesthesia Care Complications:        No immediate complications. Procedure:            Pre-Anesthesia Assessment:                       - Prior to the procedure, a History and Physical was                        performed, and patient medications, allergies and                        sensitivities were reviewed. The patient's tolerance of                        previous anesthesia was reviewed.                       - The risks and benefits of the procedure and the                        sedation options and risks were discussed with the                        patient. All questions were answered and informed                        consent was obtained.                       - ASA Grade Assessment: III - A patient with severe                        systemic disease.                       After obtaining informed consent, the colonoscope was                        passed under direct vision. Throughout the procedure,                        the patient's blood pressure, pulse, and oxygen                        saturations were monitored continuously. The                        Colonoscope was introduced through the anus and                        advanced to the  the cecum, identified by the                        appendiceal orifice, IC valve and transillumination.                        The colonoscopy was technically difficult and complex                        due to significant looping and a tortuous colon.           Successful completion of the procedure was aided by                        withdrawing the scope and replacing with the pediatric                        colonoscope and applying abdominal pressure. Findings:      The perianal and digital rectal examinations were normal.      A few small-mouthed diverticula were found in the sigmoid colon.      Non-bleeding internal hemorrhoids were found during retroflexion. The       hemorrhoids were small and Grade I (internal hemorrhoids that do not       prolapse).      The exam was otherwise without abnormality on direct and retroflexion       views.      I re examined the recto-sigmoid areas multiple times and yet so no large       lesion as described in the CT scan      The entire examined colon appeared normal on direct and retroflexion       views. Impression:           - Diverticulosis in the sigmoid colon.                       - Non-bleeding internal hemorrhoids.                       - The examination was otherwise normal on direct and                        retroflexion views.                       - The entire examined colon is normal on direct and                        retroflexion views.                       - No specimens collected. Recommendation:       - Return patient to hospital ward for ongoing care.                       - Advance diet as tolerated.                       - Continue present medications.                       - 1. The prep was less than adequate in the right colon  hence small tiny polyps < 5-7 mm may be missed but no                        large lesions seen                       2. No rectal mass seen .                       3. She had few small diverticuli- may have been the                        cause of the bleed                       4. No blood seen anywhere in the colon                       5. Suggest watch till tomorrow- if no signs of bleeding                         can d/c and follow up with me in the clinic. May                        warrant a repeat CT scan to see if the abnormal area                        previously seen has resolved ?stool. Procedure Code(s):    --- Professional ---                       [phone], Colonoscopy, flexible; diagnostic, including                        collection of specimen(s) by brushing or washing, when                        performed (separate procedure) Diagnosis Code(s):    --- Professional ---                       K92.1, Melena (includes Hematochezia)                       K64.0, First degree hemorrhoids                       K57.30, Diverticulosis of large intestine without                        perforation or abscess without bleeding CPT copyright 2016 American Medical Association. All rights reserved. The codes documented in this report are preliminary and upon coder review may  be revised to meet current compliance requirements. Jonathon Bellows, MD Jonathon Bellows MD, MD 06/02/2017 11:16:20 AM This report has been signed electronically. Number of Addenda: 0 Note Initiated On: 06/02/2017 10:01 AM Scope Withdrawal Time: 0 hours 14 minutes 43 seconds  Total Procedure Duration: 0 hours 55 minutes 8 seconds       [facility]

## 2017-06-02 NOTE — Plan of Care (Signed)
Problem: Education: Goal: Knowledge of [facility] Education information/materials will improve Outcome: Progressing Hypotensive but stable BP.  VSS, free of falls.  Denies pain, nausea.  No needs overnight.  Requested shower, explained need MD order for shower, will sponge bathe in bed.  Bed in low position, call bell within reach.  WCTM.

## 2017-06-02 NOTE — Anesthesia Post-op Follow-up Note (Signed)
Anesthesia QCDR form completed.        

## 2017-06-02 NOTE — H&P (Signed)
Jonathon Bellows MD [address] [place], [place] [address] Phone: [phone] Fax : [phone]  Primary Care Physician:  Mar Daring, PA-C Primary Gastroenterologist:  Dr. Jonathon Bellows   Pre-Procedure History & Physical: HPI:  [name] is a 66 y.o. female is here for an colonoscopy.   Past Medical History:  Diagnosis Date  . Asthma   . GERD (gastroesophageal reflux disease)   . Osteoporosis   . Peptic ulcer     Past Surgical History:  Procedure Laterality Date  . BACK SURGERY    . BREAST SURGERY    . CHOLECYSTECTOMY    . KNEE SURGERY    . mini gastric bypass    . SHOULDER SURGERY    . TONSILLECTOMY      Prior to Admission medications   Medication Sig Start Date End Date Taking? Authorizing Provider  b complex vitamins tablet Take 1 tablet by mouth every other day.   Yes [provider]  calcitRIOL (ROCALTROL) 0.5 MCG capsule Take 0.5 mcg by mouth daily. 08/30/16  Yes [provider]  Cholecalciferol (VITAMIN D3) 5000 UNITS CAPS Take 1 capsule by mouth daily.    Yes [provider]  Multiple Vitamin (MULTIVITAMIN) tablet Take 1 tablet by mouth 2 (two) times daily.    Yes [provider]  pantoprazole (PROTONIX) 40 MG tablet Take 40 mg by mouth every morning. 08/17/16  Yes [provider]  Teriparatide, Recombinant, 600 MCG/2.4ML SOLN Inject 20 mcg into the skin daily.    Yes [provider]  VENTOLIN HFA 108 (90 Base) MCG/ACT inhaler INHALE 2 PUFFS INTO THE LUNGS EVERY 6 HOURS AS NEEDED 08/21/16  Yes [provider]  VITAMIN A PO Take 1 capsule by mouth daily.    Yes [provider]  VITAMIN E PO Take 1 capsule by mouth daily.    Yes [provider]  fluticasone furoate-vilanterol (BREO ELLIPTA) 100-25 MCG/INH AEPB Inhale 1 puff into the lungs daily. Patient not taking: Reported on 05/31/2017 09/28/16   Mar Daring, PA-C  furosemide (LASIX) 20 MG tablet Take 1 tablet  (20 mg total) by mouth daily. Patient not taking: Reported on 05/31/2017 09/28/16   Mar Daring, PA-C  potassium chloride (K-DUR) 10 MEQ tablet Take 1 tablet (10 mEq total) by mouth daily. Patient not taking: Reported on 05/31/2017 09/28/16   Mar Daring, PA-C    Allergies as of 05/31/2017 - Review Complete 05/31/2017  Allergen Reaction Noted  . Lac bovis Diarrhea 08/15/2014  . Prednisone Diarrhea 08/15/2014  . Sulfa antibiotics Itching 08/15/2014  . Zithromax [azithromycin] Diarrhea 08/15/2014    Family History  Problem Relation Age of Onset  . Cancer Other   . Hypertension Other   . Stroke Other   . Diabetes Other   . Heart attack Other   . Obesity Other     Social History   Social History  . Marital status: Married    Spouse name: N/A  . Number of children: N/A  . Years of education: N/A   Occupational History  . Not on file.   Social History Main Topics  . Smoking status: Never Smoker  . Smokeless tobacco: Never Used  . Alcohol use No  . Drug use: No  . Sexual activity: No   Other Topics Concern  . Not on file   Social History Narrative  . No narrative on file    Review of Systems: See HPI, otherwise negative ROS  Physical Exam:  BP 116/73   Pulse 96   Temp 97.9 F (36.6 C) (Tympanic)   Resp 16   Ht 5\' 4"  (1.626 m)   Wt 149 lb (67.6 kg)   SpO2 99%   BMI 25.58 kg/m  General:   Alert,  pleasant and cooperative in NAD Head:  Normocephalic and atraumatic. Neck:  Supple; no masses or thyromegaly. Lungs:  Clear throughout to auscultation.    Heart:  Regular rate and rhythm. Abdomen:  Soft, nontender and nondistended. Normal bowel sounds, without guarding, and without rebound.   Neurologic:  Alert and  oriented x4;  grossly normal neurologically.  Impression/Plan: [name] is here for an colonoscopy to be performed for rectal bleeding   Risks, benefits, limitations, and alternatives regarding  colonoscopy have been reviewed  with the patient.  Questions have been answered.  All parties agreeable.   Jonathon Bellows, MD  06/02/2017, 10:00 AM

## 2017-06-02 NOTE — Anesthesia Preprocedure Evaluation (Signed)
Anesthesia Evaluation  Patient identified by MRN, date of birth, ID band Patient awake    Reviewed: Allergy & Precautions, H&P , NPO status , Patient's Chart, lab work & pertinent test results, reviewed documented beta blocker date and time   Airway Mallampati: II   Neck ROM: full    Dental  (+) Teeth Intact   Pulmonary neg pulmonary ROS, neg shortness of breath, asthma ,    Pulmonary exam normal        Cardiovascular negative cardio ROS Normal cardiovascular exam Rhythm:regular Rate:Normal     Neuro/Psych negative neurological ROS  negative psych ROS   GI/Hepatic negative GI ROS, Neg liver ROS, PUD, GERD  Medicated,  Endo/Other  negative endocrine ROS  Renal/GU negative Renal ROS  negative genitourinary   Musculoskeletal   Abdominal   Peds  Hematology negative hematology ROS (+)   Anesthesia Other Findings Past Medical History: No date: Asthma No date: GERD (gastroesophageal reflux disease) No date: Osteoporosis No date: Peptic ulcer Past Surgical History: No date: BACK SURGERY No date: BREAST SURGERY No date: CHOLECYSTECTOMY No date: KNEE SURGERY No date: mini gastric bypass No date: SHOULDER SURGERY No date: TONSILLECTOMY BMI    Body Mass Index:  25.58 kg/m     Reproductive/Obstetrics negative OB ROS                             Anesthesia Physical Anesthesia Plan  ASA: II  Anesthesia Plan: General   Post-op Pain Management:    Induction:   PONV Risk Score and Plan:   Airway Management Planned:   Additional Equipment:   Intra-op Plan:   Post-operative Plan:   Informed Consent: I have reviewed the patients History and Physical, chart, labs and discussed the procedure including the risks, benefits and alternatives for the proposed anesthesia with the patient or authorized representative who has indicated his/her understanding and acceptance.   Dental Advisory  Given  Plan Discussed with: CRNA  Anesthesia Plan Comments:         Anesthesia Quick Evaluation

## 2017-06-02 NOTE — Transfer of Care (Signed)
Immediate Anesthesia Transfer of Care Note  Patient: [name]  Procedure(s) Performed: Procedure(s): COLONOSCOPY WITH PROPOFOL (N/A)  Patient Location: PACU  Anesthesia Type:General  Level of Consciousness: awake and responds to stimulation  Airway & Oxygen Therapy: Patient Spontanous Breathing and Patient connected to nasal cannula oxygen  Post-op Assessment: Report given to RN and Post -op Vital signs reviewed and stable  Post vital signs: Reviewed and stable  Last Vitals:  Vitals:   06/02/17 0937 06/02/17 1115  BP: 116/73 (!) 105/54  Pulse:  71  Resp: 16 15  Temp: 36.6 C   SpO2: 99% 100%    Last Pain:  Vitals:   06/02/17 0937  TempSrc: Tympanic  PainSc:          Complications: No apparent anesthesia complications

## 2017-06-03 ENCOUNTER — Telehealth: Payer: Self-pay | Admitting: Physician Assistant

## 2017-06-03 ENCOUNTER — Encounter: Payer: Self-pay | Admitting: Gastroenterology

## 2017-06-03 NOTE — Discharge Instructions (Signed)
Diverticulosis Diverticulosis is a condition that develops when small pouches (diverticula) form in the wall of the large intestine (colon). The colon is where water is absorbed and stool is formed. The pouches form when the inside layer of the colon pushes through weak spots in the outer layers of the colon. You may have a few pouches or many of them. What are the causes? The cause of this condition is not known. What increases the risk? The following factors may make you more likely to develop this condition:  Being older than age 27. Your risk for this condition increases with age. Diverticulosis is rare among people younger than age 36. By age 28, many people have it.  Eating a low-fiber diet.  Having frequent constipation.  Being overweight.  Not getting enough exercise.  Smoking.  Taking over-the-counter pain medicines, like aspirin and ibuprofen.  Having a family history of diverticulosis.  What are the signs or symptoms? In most people, there are no symptoms of this condition. If you do have symptoms, they may include:  Bloating.  Cramps in the abdomen.  Constipation or diarrhea.  Pain in the lower left side of the abdomen.  How is this diagnosed? This condition is most often diagnosed during an exam for other colon problems. Because diverticulosis usually has no symptoms, it often cannot be diagnosed independently. This condition may be diagnosed by:  Using a flexible scope to examine the colon (colonoscopy).  Taking an X-ray of the colon after dye has been put into the colon (barium enema).  Doing a CT scan.  How is this treated? You may not need treatment for this condition if you have never developed an infection related to diverticulosis. If you have had an infection before, treatment may include:  Eating a high-fiber diet. This may include eating more fruits, vegetables, and grains.  Taking a fiber supplement.  Taking a live bacteria supplement  (probiotic).  Taking medicine to relax your colon.  Taking antibiotic medicines.  Follow these instructions at home:  Drink 6-8 glasses of water or more each day to prevent constipation.  Try not to strain when you have a bowel movement.  If you have had an infection before: ? Eat more fiber as directed by your health care provider or your diet and nutrition specialist (dietitian). ? Take a fiber supplement or probiotic, if your health care provider approves.  Take over-the-counter and prescription medicines only as told by your health care provider.  If you were prescribed an antibiotic, take it as told by your health care provider. Do not stop taking the antibiotic even if you start to feel better.  Keep all follow-up visits as told by your health care provider. This is important. Contact a health care provider if:  You have pain in your abdomen.  You have bloating.  You have cramps.  You have not had a bowel movement in 3 days. Get help right away if:  Your pain gets worse.  Your bloating becomes very bad.  You have a fever or chills, and your symptoms suddenly get worse.  You vomit.  You have bowel movements that are bloody or black.  You have bleeding from your rectum. Summary  Diverticulosis is a condition that develops when small pouches (diverticula) form in the wall of the large intestine (colon).  You may have a few pouches or many of them.  This condition is most often diagnosed during an exam for other colon problems.  If you have had an  infection related to diverticulosis, treatment may include increasing the fiber in your diet, taking supplements, or taking medicines. This information is not intended to replace advice given to you by your health care provider. Make sure you discuss any questions you have with your health care provider. Document Released: 05/28/2004 Document Revised: 07/20/2016 Document Reviewed: 07/20/2016 Elsevier Interactive  Patient Education  2017 [employer].   Gastrointestinal Bleeding Gastrointestinal bleeding is bleeding somewhere along the path food travels through the body (digestive tract). This path is anywhere between the mouth and the opening of the butt (anus). You may have blood in your poop (stools) or have black poop. If you throw up (vomit), there may be blood in it. This condition can be mild, serious, or even life-threatening. If you have a lot of bleeding, you may need to stay in the hospital. Follow these instructions at home:  Take over-the-counter and prescription medicines only as told by your doctor.  Eat foods that have a lot of fiber in them. These foods include whole grains, fruits, and vegetables. You can also try eating 1-3 prunes each day.  Drink enough fluid to keep your pee (urine) clear or pale yellow.  Keep all follow-up visits as told by your doctor. This is important. Contact a doctor if:  Your symptoms do not get better. Get help right away if:  Your bleeding gets worse.  You feel dizzy or you pass out (faint).  You feel weak.  You have very bad cramps in your back or belly (abdomen).  You pass large clumps of blood (clots) in your poop.  Your symptoms are getting worse. This information is not intended to replace advice given to you by your health care provider. Make sure you discuss any questions you have with your health care provider. Document Released: 06/09/2008 Document Revised: 02/06/2016 Document Reviewed: 02/18/2015 Elsevier Interactive Patient Education  2018 [employer].

## 2017-06-03 NOTE — Progress Notes (Signed)
Discharge instruction reviewed with patient and spouse. IV removed with out complications. Patient with no questions at this time. Notified to follow up with GI and PCP. Appointments given to patient.

## 2017-06-03 NOTE — Telephone Encounter (Signed)
Pt is being discharged from [facility] today for GI Bleed.  I have scheduled a follow up appointment/MW

## 2017-06-03 NOTE — Anesthesia Postprocedure Evaluation (Signed)
Anesthesia Post Note  Patient: [name]  Procedure(s) Performed: Procedure(s) (LRB): COLONOSCOPY WITH PROPOFOL (N/A)  Patient location during evaluation: PACU Anesthesia Type: General Level of consciousness: awake and alert Pain management: pain level controlled Vital Signs Assessment: post-procedure vital signs reviewed and stable Respiratory status: spontaneous breathing, nonlabored ventilation, respiratory function stable and patient connected to nasal cannula oxygen Cardiovascular status: blood pressure returned to baseline and stable Postop Assessment: no apparent nausea or vomiting Anesthetic complications: no     Last Vitals:  Vitals:   06/02/17 2130 06/03/17 0527  BP: (!) 87/57 101/64  Pulse: 77 73  Resp: 20 16  Temp: 36.9 C 36.7 C  SpO2: 98% 97%    Last Pain:  Vitals:   06/03/17 0900  TempSrc:   PainSc: 0-No pain                 Molli Barrows

## 2017-06-03 NOTE — Telephone Encounter (Signed)
Please Review. Patient scheduled 10/01.  Thanks,  -Yashica Sterbenz

## 2017-06-04 ENCOUNTER — Telehealth: Payer: Self-pay

## 2017-06-04 NOTE — Discharge Summary (Signed)
[facility] [place] NAME: [name]    MR#:  [id number]  DATE OF BIRTH:  August 19, 1951  DATE OF ADMISSION:  05/31/2017   ADMITTING PHYSICIAN: Lance Coon, MD  DATE OF DISCHARGE: 06/03/2017  3:25 PM  PRIMARY CARE PHYSICIAN: Mar Daring, PA-C   ADMISSION DIAGNOSIS:  Lower GI bleed [K92.2] Rectal mass [K62.9] DISCHARGE DIAGNOSIS:  Principal Problem:   Lower GI bleed Active Problems:   Rectal mass   Asthma   GERD (gastroesophageal reflux disease)  SECONDARY DIAGNOSIS:   Past Medical History:  Diagnosis Date  . Asthma   . GERD (gastroesophageal reflux disease)   . Osteoporosis   . Peptic ulcer    HOSPITAL COURSE:   *Chest tightness/shortness of breath Likely due to stress, some fluid overload likely iatrogenic - resolved  *Hypotension -Likely due to ongoing blood loss  - improved * * Rectal mass with bleeding: mass was not visualised on c-scope - likley blood clot/collection - s/p c-scope showing Diverticulosis in the sigmoid colon.                       - Non-bleeding internal hemorrhoids.                     * Acute blood loss anemia: Hemoglobin 8.3 - Hemoglobin stabilized DISCHARGE CONDITIONS:  stable CONSULTS OBTAINED:  Treatment Team:  Jonathon Bellows, MD Vickie Epley, MD DRUG ALLERGIES:   Allergies  Allergen Reactions  . Lac Bovis Diarrhea  . Prednisone Diarrhea  . Sulfa Antibiotics Itching  . Zithromax [Azithromycin] Diarrhea   DISCHARGE MEDICATIONS:   Allergies as of 06/03/2017      Reactions   Lac Bovis Diarrhea   Prednisone Diarrhea   Sulfa Antibiotics Itching   Zithromax [azithromycin] Diarrhea      Medication List    TAKE these medications   b complex vitamins tablet Take 1 tablet by mouth every other day.   calcitRIOL 0.5 MCG capsule Commonly known as:  ROCALTROL Take 0.5 mcg by mouth daily.   fluticasone furoate-vilanterol 100-25 MCG/INH Aepb Commonly known as:  BREO  ELLIPTA Inhale 1 puff into the lungs daily.   furosemide 20 MG tablet Commonly known as:  LASIX Take 1 tablet (20 mg total) by mouth daily.   multivitamin tablet Take 1 tablet by mouth 2 (two) times daily.   pantoprazole 40 MG tablet Commonly known as:  PROTONIX Take 40 mg by mouth every morning.   potassium chloride 10 MEQ tablet Commonly known as:  K-DUR Take 1 tablet (10 mEq total) by mouth daily.   Teriparatide (Recombinant) 600 MCG/2.4ML Soln Inject 20 mcg into the skin daily.   VENTOLIN HFA 108 (90 Base) MCG/ACT inhaler Generic drug:  albuterol INHALE 2 PUFFS INTO THE LUNGS EVERY 6 HOURS AS NEEDED   VITAMIN A PO Take 1 capsule by mouth daily.   Vitamin D3 5000 units Caps Take 1 capsule by mouth daily.   VITAMIN E PO Take 1 capsule by mouth daily.            Discharge Care Instructions        Start     Ordered   06/03/17 0000  Increase activity slowly     06/03/17 1056   06/03/17 0000  Diet - low sodium heart healthy     06/03/17 1056       DISCHARGE INSTRUCTIONS:   DIET:  Cardiac diet DISCHARGE CONDITION:  Good ACTIVITY:  Activity as tolerated OXYGEN:  Home Oxygen: No.  Oxygen Delivery: room air DISCHARGE LOCATION:  home   If you experience worsening of your admission symptoms, develop shortness of breath, life threatening emergency, suicidal or homicidal thoughts you must seek medical attention immediately by calling 911 or calling your MD immediately  if symptoms less severe.  You Must read complete instructions/literature along with all the possible adverse reactions/side effects for all the Medicines you take and that have been prescribed to you. Take any new Medicines after you have completely understood and accpet all the possible adverse reactions/side effects.   Please note  You were cared for by a hospitalist during your hospital stay. If you have any questions about your discharge medications or the care you received while you  were in the hospital after you are discharged, you can call the unit and asked to speak with the hospitalist on call if the hospitalist that took care of you is not available. Once you are discharged, your primary care physician will handle any further medical issues. Please note that NO REFILLS for any discharge medications will be authorized once you are discharged, as it is imperative that you return to your primary care physician (or establish a relationship with a primary care physician if you do not have one) for your aftercare needs so that they can reassess your need for medications and monitor your lab values.    On the day of Discharge:  VITAL SIGNS:  Blood pressure 101/64, pulse 73, temperature 98 F (36.7 C), temperature source Oral, resp. rate 16, height 5\' 4"  (1.626 m), weight 67.6 kg (149 lb), SpO2 97 %. PHYSICAL EXAMINATION:  GENERAL:  66 y.o.-year-old patient lying in the bed with no acute distress.  EYES: Pupils equal, round, reactive to light and accommodation. No scleral icterus. Extraocular muscles intact.  HEENT: Head atraumatic, normocephalic. Oropharynx and nasopharynx clear.  NECK:  Supple, no jugular venous distention. No thyroid enlargement, no tenderness.  LUNGS: Normal breath sounds bilaterally, no wheezing, rales,rhonchi or crepitation. No use of accessory muscles of respiration.  CARDIOVASCULAR: S1, S2 normal. No murmurs, rubs, or gallops.  ABDOMEN: Soft, non-tender, non-distended. Bowel sounds present. No organomegaly or mass.  EXTREMITIES: No pedal edema, cyanosis, or clubbing.  NEUROLOGIC: Cranial nerves II through XII are intact. Muscle strength 5/5 in all extremities. Sensation intact. Gait not checked.  PSYCHIATRIC: The patient is alert and oriented x 3.  SKIN: No obvious rash, lesion, or ulcer.  DATA REVIEW:   CBC  Recent Labs Lab 06/01/17 0537  06/02/17 0552  WBC 4.8  --   --   HGB 8.6*  < > 8.3*  HCT 26.3*  --   --   PLT 206  --   --   < > =  values in this interval not displayed.  Chemistries   Recent Labs Lab 05/31/17 1144 06/01/17 0537  NA 136 139  K 4.4 4.0  CL 102 104  CO2 27 29  GLUCOSE 83 82  BUN 15 13  CREATININE 0.56 0.68  CALCIUM 8.5* 7.8*  AST 30  --   ALT 23  --   ALKPHOS 73  --   BILITOT 0.7  --     Follow-up Information    Mar Daring, PA-C. Go on 06/14/2017.   Specialty:  Family Medicine Why:  @3 :45 PM Contact information: [address] [place] [address] [phone]        Jonathon Bellows, MD. Daphane Shepherd  on 06/17/2017.   Specialty:  Surgery Why:  @1 :00 PM Contact information: [place] [address] [phone]           Management plans discussed with the patient, family and they are in agreement.  CODE STATUS: Prior   TOTAL TIME TAKING CARE OF THIS PATIENT: 45 minutes.    Max Sane M.D on 06/04/2017 at 9:39 AM  Between 7am to 6pm - Pager - [phone]  After 6pm go to [email or web address] - password [email or web address] [facility]  [facility] [place]  Office  [phone]  CC: Primary care physician; Mar Daring, PA-C   Note: This dictation was prepared with Dragon dictation along with smaller phrase technology. Any transcriptional errors that result from this process are unintentional.

## 2017-06-07 ENCOUNTER — Encounter: Payer: Self-pay | Admitting: Physician Assistant

## 2017-06-07 ENCOUNTER — Ambulatory Visit (INDEPENDENT_AMBULATORY_CARE_PROVIDER_SITE_OTHER): Payer: 59 | Admitting: Physician Assistant

## 2017-06-07 VITALS — BP 120/80 | HR 78 | Temp 98.2°F | Resp 16 | Wt 147.2 lb

## 2017-06-07 DIAGNOSIS — D500 Iron deficiency anemia secondary to blood loss (chronic): Secondary | ICD-10-CM

## 2017-06-07 DIAGNOSIS — J40 Bronchitis, not specified as acute or chronic: Secondary | ICD-10-CM | POA: Diagnosis not present

## 2017-06-07 DIAGNOSIS — K922 Gastrointestinal hemorrhage, unspecified: Secondary | ICD-10-CM | POA: Diagnosis not present

## 2017-06-07 LAB — CBC WITH DIFFERENTIAL/PLATELET
BASOS ABS: 18 {cells}/uL (ref 0–200)
BASOS PCT: 0.3 %
EOS ABS: 118 {cells}/uL (ref 15–500)
Eosinophils Relative: 2 %
HEMATOCRIT: 27.2 % — AB (ref 35.0–45.0)
HEMOGLOBIN: 8.4 g/dL — AB (ref 11.7–15.5)
LYMPHS ABS: 1086 {cells}/uL (ref 850–3900)
MCH: 26.7 pg — AB (ref 27.0–33.0)
MCHC: 30.9 g/dL — ABNORMAL LOW (ref 32.0–36.0)
MCV: 86.3 fL (ref 80.0–100.0)
MONOS PCT: 10.1 %
MPV: 11 fL (ref 7.5–12.5)
NEUTROS ABS: 4083 {cells}/uL (ref 1500–7800)
Neutrophils Relative %: 69.2 %
Platelets: 260 10*3/uL (ref 140–400)
RBC: 3.15 10*6/uL — ABNORMAL LOW (ref 3.80–5.10)
RDW: 14.1 % (ref 11.0–15.0)
Total Lymphocyte: 18.4 %
WBC mixed population: 596 cells/uL (ref 200–950)
WBC: 5.9 10*3/uL (ref 3.8–10.8)

## 2017-06-07 MED ORDER — ALBUTEROL SULFATE HFA 108 (90 BASE) MCG/ACT IN AERS: INHALATION_SPRAY | RESPIRATORY_TRACT | 0 refills | Status: DC

## 2017-06-07 MED ORDER — GUAIFENESIN ER 600 MG PO TB12: 600.0000 mg | ORAL_TABLET | Freq: Two times a day (BID) | ORAL | 0 refills | Status: DC

## 2017-06-07 MED ORDER — MOXIFLOXACIN HCL 400 MG PO TABS: 400.0000 mg | ORAL_TABLET | Freq: Every day | ORAL | 0 refills | Status: DC

## 2017-06-07 NOTE — Telephone Encounter (Signed)
Pt did not return my phone call, but did see PCP in office today. Closing encounter.  -MM

## 2017-06-07 NOTE — Patient Instructions (Signed)
Acute Bronchitis, Adult Acute bronchitis is when air tubes (bronchi) in the lungs suddenly get swollen. The condition can make it hard to breathe. It can also cause these symptoms:  A cough.  Coughing up clear, yellow, or green mucus.  Wheezing.  Chest congestion.  Shortness of breath.  A fever.  Body aches.  Chills.  A sore throat.  Follow these instructions at home: Medicines  Take over-the-counter and prescription medicines only as told by your doctor.  If you were prescribed an antibiotic medicine, take it as told by your doctor. Do not stop taking the antibiotic even if you start to feel better. General instructions  Rest.  Drink enough fluids to keep your pee (urine) clear or pale yellow.  Avoid smoking and secondhand smoke. If you smoke and you need help quitting, ask your doctor. Quitting will help your lungs heal faster.  Use an inhaler, cool mist vaporizer, or humidifier as told by your doctor.  Keep all follow-up visits as told by your doctor. This is important. How is this prevented? To lower your risk of getting this condition again:  Wash your hands often with soap and water. If you cannot use soap and water, use hand sanitizer.  Avoid contact with people who have cold symptoms.  Try not to touch your hands to your mouth, nose, or eyes.  Make sure to get the flu shot every year.  Contact a doctor if:  Your symptoms do not get better in 2 weeks. Get help right away if:  You cough up blood.  You have chest pain.  You have very bad shortness of breath.  You become dehydrated.  You faint (pass out) or keep feeling like you are going to pass out.  You keep throwing up (vomiting).  You have a very bad headache.  Your fever or chills gets worse. This information is not intended to replace advice given to you by your health care provider. Make sure you discuss any questions you have with your health care provider. Document Released:  02/17/2008 Document Revised: 04/08/2016 Document Reviewed: 02/19/2016 Elsevier Interactive Patient Education  2017 [employer]  Anemia, Nonspecific Anemia is a condition in which the concentration of red blood cells or hemoglobin in the blood is below normal. Hemoglobin is a substance in red blood cells that carries oxygen to the tissues of the body. Anemia results in not enough oxygen reaching these tissues. What are the causes? Common causes of anemia include:  Excessive bleeding. Bleeding may be internal or external. This includes excessive bleeding from periods (in women) or from the intestine.  Poor nutrition.  Chronic kidney, thyroid, and liver disease.  Bone marrow disorders that decrease red blood cell production.  Cancer and treatments for cancer.  HIV, AIDS, and their treatments.  Spleen problems that increase red blood cell destruction.  Blood disorders.  Excess destruction of red blood cells due to infection, medicines, and autoimmune disorders.  What are the signs or symptoms?  Minor weakness.  Dizziness.  Headache.  Palpitations.  Shortness of breath, especially with exercise.  Paleness.  Cold sensitivity.  Indigestion.  Nausea.  Difficulty sleeping.  Difficulty concentrating. Symptoms may occur suddenly or they may develop slowly. How is this diagnosed? Additional blood tests are often needed. These help your health care provider determine the best treatment. Your health care provider will check your stool for blood and look for other causes of blood loss. How is this treated? Treatment varies depending on the cause of the anemia.  Treatment can include:  Supplements of iron, vitamin L41, or folic acid.  Hormone medicines.  A blood transfusion. This may be needed if blood loss is severe.  Hospitalization. This may be needed if there is significant continual blood loss.  Dietary changes.  Spleen removal.  Follow these instructions at  home: Keep all follow-up appointments. It often takes many weeks to correct anemia, and having your health care provider check on your condition and your response to treatment is very important. Get help right away if:  You develop extreme weakness, shortness of breath, or chest pain.  You become dizzy or have trouble concentrating.  You develop heavy vaginal bleeding.  You develop a rash.  You have bloody or black, tarry stools.  You faint.  You vomit up blood.  You vomit repeatedly.  You have abdominal pain.  You have a fever or persistent symptoms for more than 2-3 days.  You have a fever and your symptoms suddenly get worse.  You are dehydrated. This information is not intended to replace advice given to you by your health care provider. Make sure you discuss any questions you have with your health care provider. Document Released: 10/08/2004 Document Revised: 02/12/2016 Document Reviewed: 02/24/2013 Elsevier Interactive Patient Education  2017 [employer].

## 2017-06-07 NOTE — Progress Notes (Signed)
Patient: [name] Female    DOB: Mar 23, 1951   66 y.o.   MRN: [id number] Visit Date: 06/07/2017  Today's Provider: Mar Daring, PA-C   Chief Complaint  Patient presents with  . URI   Subjective:    URI   This is a new problem. The current episode started in the past 7 days (she reports it started on Tuesday when she was at the hospital. ). The problem has been gradually worsening. There has been no fever. Associated symptoms include chest pain (" alittle from the chest congestion"), congestion, coughing, headaches, a plugged ear sensation, rhinorrhea, sinus pain, sneezing and wheezing. Pertinent negatives include no abdominal pain, diarrhea, ear pain, nausea, sore throat or vomiting.   Patient was also recently hospitalized on 05/31/17 for rectal bleeding with anemia. HgB dropped during hospitalization. There was an abnormal CT which showed a possible rectal mass, however, on colonoscopy there was no source for bleeding found, nor was there a mass. It was felt the CT mass was possibly a large blood clot found in the rectal vault. Patient reports no rectal bleeding since. She is still pale and weak. She is not on any iron supplementation at this time.     Allergies  Allergen Reactions  . Lac Bovis Diarrhea  . Prednisone Diarrhea  . Sulfa Antibiotics Itching  . Zithromax [Azithromycin] Diarrhea     Current Outpatient Prescriptions:  .  b complex vitamins tablet, Take 1 tablet by mouth every other day., Disp: , Rfl:  .  calcitRIOL (ROCALTROL) 0.5 MCG capsule, Take 0.5 mcg by mouth daily., Disp: , Rfl: 5 .  Cholecalciferol (VITAMIN D3) 5000 UNITS CAPS, Take 1 capsule by mouth daily. , Disp: , Rfl:  .  fluticasone furoate-vilanterol (BREO ELLIPTA) 100-25 MCG/INH AEPB, Inhale 1 puff into the lungs daily., Disp: 60 each, Rfl: 5 .  Multiple Vitamin (MULTIVITAMIN) tablet, Take 1 tablet by mouth 2 (two) times daily. , Disp: , Rfl:  .  pantoprazole (PROTONIX) 40 MG tablet,  Take 40 mg by mouth every morning., Disp: , Rfl: 3 .  Teriparatide, Recombinant, 600 MCG/2.4ML SOLN, Inject 20 mcg into the skin daily. , Disp: , Rfl:  .  VITAMIN A PO, Take 1 capsule by mouth daily. , Disp: , Rfl:  .  VITAMIN E PO, Take 1 capsule by mouth daily. , Disp: , Rfl:  .  furosemide (LASIX) 20 MG tablet, Take 1 tablet (20 mg total) by mouth daily. (Patient not taking: Reported on 05/31/2017), Disp: 30 tablet, Rfl: 3 .  potassium chloride (K-DUR) 10 MEQ tablet, Take 1 tablet (10 mEq total) by mouth daily. (Patient not taking: Reported on 05/31/2017), Disp: 30 tablet, Rfl: 3 .  VENTOLIN HFA 108 (90 Base) MCG/ACT inhaler, INHALE 2 PUFFS INTO THE LUNGS EVERY 6 HOURS AS NEEDED, Disp: , Rfl: 0  Review of Systems  Constitutional: Positive for fatigue. Negative for fever.  HENT: Positive for congestion, postnasal drip, rhinorrhea, sinus pain, sinus pressure and sneezing. Negative for ear discharge, ear pain, sore throat and trouble swallowing.   Respiratory: Positive for cough, chest tightness, shortness of breath and wheezing.   Cardiovascular: Positive for chest pain (" alittle from the chest congestion"). Negative for palpitations and leg swelling.  Gastrointestinal: Negative for abdominal pain, anal bleeding, blood in stool, constipation, diarrhea, nausea, rectal pain and vomiting.  Musculoskeletal: Negative for back pain.  Neurological: Positive for weakness, light-headedness and headaches. Negative for dizziness.  Hematological: Negative for adenopathy.  Does not bruise/bleed easily.    Social History  Substance Use Topics  . Smoking status: Never Smoker  . Smokeless tobacco: Never Used  . Alcohol use No   Objective:   BP 120/80 (BP Location: Right Arm, Patient Position: Sitting, Cuff Size: Normal)   Pulse 78   Temp 98.2 F (36.8 C) (Oral)   Resp 16   Wt 147 lb 3.2 oz (66.8 kg)   SpO2 99%   BMI 25.27 kg/m     Physical Exam  Constitutional: She appears well-developed and  well-nourished. No distress.  HENT:  Head: Normocephalic and atraumatic.  Right Ear: Hearing, tympanic membrane, external ear and ear canal normal.  Left Ear: Hearing, tympanic membrane, external ear and ear canal normal.  Nose: Nose normal.  Mouth/Throat: Uvula is midline, oropharynx is clear and moist and mucous membranes are normal. No oropharyngeal exudate.  Eyes: Pupils are equal, round, and reactive to light. Conjunctivae are normal. Right eye exhibits no discharge. Left eye exhibits no discharge. No scleral icterus.  Neck: Normal range of motion. Neck supple. No tracheal deviation present. No thyromegaly present.  Cardiovascular: Normal rate, regular rhythm and normal heart sounds.  Exam reveals no gallop and no friction rub.   No murmur heard. Pulmonary/Chest: Effort normal. No stridor. No respiratory distress. She has no decreased breath sounds. She has wheezes (throughout). She has rhonchi (throughout). She has no rales.  Lymphadenopathy:    She has no cervical adenopathy.  Skin: Skin is warm and dry. She is not diaphoretic. There is pallor.  Vitals reviewed.       Assessment & Plan:     1. Bronchitis Worsening symptoms. Will give avelox as below. Mucinex given for congestion. Continue Breo that was given at the hospital once daily. Albuterol inhaler refilled as below for as needed use. She is to call if symptoms worsen. - moxifloxacin (AVELOX) 400 MG tablet; Take 1 tablet (400 mg total) by mouth daily at 8 pm.  Dispense: 10 tablet; Refill: 0 - albuterol (VENTOLIN HFA) 108 (90 Base) MCG/ACT inhaler; INHALE 2 PUFFS INTO THE LUNGS EVERY 6 HOURS AS NEEDED  Dispense: 18 g; Refill: 0 - guaiFENesin (MUCINEX) 600 MG 12 hr tablet; Take 1 tablet (600 mg total) by mouth 2 (two) times daily.  Dispense: 30 tablet; Refill: 0  2. Lower GI bleed HgB was stabilized at 8.3 at discharge. Will recheck HgB as below. I will f/u pending results. No source found. Will provide watchful monitoring at  this time.  - CBC w/Diff/Platelet  3. Iron deficiency anemia due to chronic blood loss See above medical treatment plan. - CBC w/Diff/Platelet       Mar Daring, PA-C  [facility]

## 2017-06-08 ENCOUNTER — Telehealth: Payer: Self-pay | Admitting: *Deleted

## 2017-06-08 NOTE — Telephone Encounter (Signed)
Patient was notified.

## 2017-06-08 NOTE — Telephone Encounter (Signed)
Patient stated that she forgot to tell [facility] yesterday at [facility], that she has been having slight numbness, tingling in lips and tongue. Patient wanted to know if this is something she should be concerned about?

## 2017-06-08 NOTE — Telephone Encounter (Signed)
We need to monitor but could be due to severe anemia. Call if it worsens. I will see her in 2 weeks.

## 2017-06-14 ENCOUNTER — Inpatient Hospital Stay: Payer: 59 | Admitting: Physician Assistant

## 2017-06-17 ENCOUNTER — Encounter: Payer: Self-pay | Admitting: Gastroenterology

## 2017-06-17 ENCOUNTER — Ambulatory Visit (INDEPENDENT_AMBULATORY_CARE_PROVIDER_SITE_OTHER): Payer: 59 | Admitting: Gastroenterology

## 2017-06-17 ENCOUNTER — Other Ambulatory Visit
Admission: RE | Admit: 2017-06-17 | Discharge: 2017-06-17 | Disposition: A | Discharge status: Home / Self Care | Payer: 59 | Source: Ambulatory Visit | Attending: Gastroenterology | Admitting: Gastroenterology

## 2017-06-17 VITALS — BP 108/71 | HR 92 | Temp 98.1°F | Ht 63.0 in | Wt 151.6 lb

## 2017-06-17 DIAGNOSIS — D649 Anemia, unspecified: Secondary | ICD-10-CM | POA: Diagnosis not present

## 2017-06-17 DIAGNOSIS — R1907 Generalized intra-abdominal and pelvic swelling, mass and lump: Secondary | ICD-10-CM | POA: Insufficient documentation

## 2017-06-17 LAB — URINALYSIS, ROUTINE W REFLEX MICROSCOPIC
BILIRUBIN URINE: NEGATIVE
Glucose, UA: NEGATIVE mg/dL
Hgb urine dipstick: NEGATIVE
KETONES UR: NEGATIVE mg/dL
LEUKOCYTES UA: NEGATIVE
NITRITE: NEGATIVE
PH: 5 (ref 5.0–8.0)
PROTEIN: NEGATIVE mg/dL
Specific Gravity, Urine: 1.017 (ref 1.005–1.030)

## 2017-06-17 LAB — BASIC METABOLIC PANEL
Anion gap: 7 (ref 5–15)
BUN: 16 mg/dL (ref 6–20)
CALCIUM: 8.2 mg/dL — AB (ref 8.9–10.3)
CHLORIDE: 109 mmol/L (ref 101–111)
CO2: 25 mmol/L (ref 22–32)
CREATININE: 0.62 mg/dL (ref 0.44–1.00)
GFR calc Af Amer: >60 mL/min (ref >60)
GFR calc non Af Amer: >60 mL/min (ref >60)
GLUCOSE: 91 mg/dL (ref 65–99)
Potassium: 4.3 mmol/L (ref 3.5–5.1)
Sodium: 141 mmol/L (ref 135–145)

## 2017-06-17 LAB — IRON AND TIBC
Iron: 11 ug/dL — ABNORMAL LOW (ref 28–170)
Saturation Ratios: 3 % — ABNORMAL LOW (ref 10.4–31.8)
TIBC: 405 ug/dL (ref 250–450)
UIBC: 394 ug/dL

## 2017-06-17 LAB — FOLATE: FOLATE: 19.4 ng/mL (ref >5.9)

## 2017-06-17 LAB — FERRITIN: FERRITIN: 3 ng/mL — AB (ref 11–307)

## 2017-06-17 LAB — VITAMIN B12: VITAMIN B 12: 1183 pg/mL — AB (ref 180–914)

## 2017-06-17 NOTE — Addendum Note (Signed)
Addended by: Peggye Ley on: 06/17/2017 01:18 PM   Modules accepted: Orders

## 2017-06-17 NOTE — Progress Notes (Signed)
Jonathon Bellows MD, MRCP(U.K) [address]  [place]  [place], [place] [address]  Main: [phone]  Fax: [phone]   Primary Care Physician: Mar Daring, PA-C  Primary Gastroenterologist:  Dr. Jonathon Bellows   No chief complaint on file.   HPI: [name] is a 66 y.o. female    She is here today for a hospital follow up .  Summary of history :  I was consulted to see her when she was recently admitted to [facility] on 05/31/17 with rectal bleeding and abdominal pain.On admission her Hb was 8.6 grams with MCv 87, in 09/2016 her Hb was 10.8 grams.  A Large mass seen in the rectum on CT scan of the abdomen. Hb 8.6 with MCV 87.2.She has a family history of colon cancer in her mother, she has had regular large qty of blood mixed with the stools which has been bright red, unintentional weight loss since April 2018 .I performed a colonoscopy on 05/31/17 found no blood, did note small internal hemorroids and diverticulosis of the colon , prep was fair on the right side.    .   Interval history   06/01/2017-  06/17/2017   Since her discharge has not seen any blood. No other complaints.    Current Outpatient Prescriptions  Medication Sig Dispense Refill  . albuterol (VENTOLIN HFA) 108 (90 Base) MCG/ACT inhaler INHALE 2 PUFFS INTO THE LUNGS EVERY 6 HOURS AS NEEDED 18 g 0  . b complex vitamins tablet Take 1 tablet by mouth every other day.    . calcitRIOL (ROCALTROL) 0.5 MCG capsule Take 0.5 mcg by mouth daily.  5  . Cholecalciferol (VITAMIN D3) 5000 UNITS CAPS Take 1 capsule by mouth daily.     . fluticasone furoate-vilanterol (BREO ELLIPTA) 100-25 MCG/INH AEPB Inhale 1 puff into the lungs daily. 60 each 5  . furosemide (LASIX) 20 MG tablet Take 1 tablet (20 mg total) by mouth daily. (Patient not taking: Reported on 05/31/2017) 30 tablet 3  . guaiFENesin (MUCINEX) 600 MG 12 hr tablet Take 1 tablet (600 mg total) by mouth 2 (two) times daily. 30 tablet 0  . moxifloxacin (AVELOX) 400  MG tablet Take 1 tablet (400 mg total) by mouth daily at 8 pm. 10 tablet 0  . Multiple Vitamin (MULTIVITAMIN) tablet Take 1 tablet by mouth 2 (two) times daily.     . pantoprazole (PROTONIX) 40 MG tablet Take 40 mg by mouth every morning.  3  . potassium chloride (K-DUR) 10 MEQ tablet Take 1 tablet (10 mEq total) by mouth daily. (Patient not taking: Reported on 05/31/2017) 30 tablet 3  . Teriparatide, Recombinant, 600 MCG/2.4ML SOLN Inject 20 mcg into the skin daily.     Marland Kitchen VITAMIN A PO Take 1 capsule by mouth daily.     Marland Kitchen VITAMIN E PO Take 1 capsule by mouth daily.      No current facility-administered medications for this visit.     Allergies as of 06/17/2017 - Review Complete 06/07/2017  Allergen Reaction Noted  . Lac bovis Diarrhea 08/15/2014  . Prednisone Diarrhea 08/15/2014  . Sulfa antibiotics Itching 08/15/2014  . Zithromax [azithromycin] Diarrhea 08/15/2014    ROS:  General: Negative for anorexia, weight loss, fever, chills, fatigue, weakness. ENT: Negative for hoarseness, difficulty swallowing , nasal congestion. CV: Negative for chest pain, angina, palpitations, dyspnea on exertion, peripheral edema.  Respiratory: Negative for dyspnea at rest, dyspnea on exertion, cough, sputum, wheezing.  GI: See history of present illness.  GU:  Negative for dysuria, hematuria, urinary incontinence, urinary frequency, nocturnal urination.  Endo: Negative for unusual weight change.    Physical Examination:   There were no vitals taken for this visit.  General: Well-nourished, well-developed in no acute distress.  Eyes: No icterus. Conjunctivae pink. Mouth: Oropharyngeal mucosa moist and pink , no lesions erythema or exudate. Lungs: Clear to auscultation bilaterally. Non-labored. Heart: Regular rate and rhythm, no murmurs rubs or gallops.  Abdomen: Bowel sounds are normal, nontender, nondistended, no hepatosplenomegaly or masses, no abdominal bruits or hernia , no rebound or guarding.     Extremities: No lower extremity edema. No clubbing or deformities. Neuro: Alert and oriented x 3.  Grossly intact. Skin: Warm and dry, no jaundice.   Psych: Alert and cooperative, normal mood and affect.   Imaging Studies: Ct Abdomen Pelvis W Contrast  Result Date: 05/31/2017 CLINICAL DATA:  Passing large amount of bright red blood per rectum since prior Friday with right lower quadrant pain. EXAM: CT ABDOMEN AND PELVIS WITH CONTRAST TECHNIQUE: Multidetector CT imaging of the abdomen and pelvis was performed using the standard protocol following bolus administration of intravenous contrast. CONTRAST:  167mL ISOVUE-300 IOPAMIDOL (ISOVUE-300) INJECTION 61% COMPARISON:  March 07, 2015 FINDINGS: Lower chest: No acute abnormality. Hepatobiliary: The liver is normal without focal liver lesion. There is intra and extrahepatic biliary ductal dilatation with common bowel duct measuring 1.2 cm. This is postsurgical. The patient status post prior cholecystectomy. Pancreas: Unremarkable. No pancreatic ductal dilatation or surrounding inflammatory changes. Spleen: Normal in size without focal abnormality. Adrenals/Urinary Tract: Adrenal glands are unremarkable. There is no focal kidney stone or hydronephrosis bilaterally. Left parapelvic renal cysts is identified. A 1 cm left renal cortical cyst is noted. Bladder is unremarkable. Stomach/Bowel: In the rectum, there is a 2.8 x 3.2 cm heterogeneous enhancing lobulated mass. Stomach is within normal limits. Appendix appears normal. No evidence of bowel wall thickening, distention, or inflammatory changes. Vascular/Lymphatic: Aortic atherosclerosis. No enlarged abdominal or pelvic lymph nodes. Reproductive: Status post hysterectomy. Bilobed cyst is identified in the left adnexa unchanged compared prior exam in 2016. Other: None. Musculoskeletal: Vertebroplasty materials are identified in the lumbar spine. Diffuse osteopenia is noted. IMPRESSION: In the rectum, there is a  2.8 x 3.2 heterogeneous enhancing lobulated mass. Status post prior cholecystectomy with postsurgical intra and extrahepatic biliary ductal dilatation. Stable cystic structure in the left adnexa unchanged compared prior CT. Electronically Signed   By: Abelardo Diesel M.D.   On: 05/31/2017 15:40    Assessment and Plan:   [name] is a 66 y.o. y/o female here to follow up for hospital discharge. A mass was seen on a CT scan in the rectum although none seen on a colonoscopy, in addition she had rectal bleeding and a normocytic anemia.   Plan  1. Repeat CT scan of the abdomen to ensure the previously seen mass is no longer present as none seen on colonoscopy  2. Anemia : Check iron studies and based on results may need EGD as part of work up for anemia.   Dr Jonathon Bellows  MD,MRCP [facility]) Follow up in 6-8 weeks

## 2017-06-17 NOTE — Addendum Note (Signed)
Addended by: Peggye Ley on: 06/17/2017 01:13 PM   Modules accepted: Orders

## 2017-06-17 NOTE — Addendum Note (Signed)
Addended by: Peggye Ley on: 06/17/2017 01:25 PM   Modules accepted: Orders

## 2017-06-18 ENCOUNTER — Telehealth: Payer: Self-pay

## 2017-06-18 ENCOUNTER — Other Ambulatory Visit: Payer: Self-pay

## 2017-06-18 DIAGNOSIS — D509 Iron deficiency anemia, unspecified: Secondary | ICD-10-CM

## 2017-06-18 NOTE — Telephone Encounter (Signed)
-----   Message from Jonathon Bellows, MD sent at 06/18/2017  8:16 AM EDT ----- Inform  1. She is iron deficient and usually indicates a chronic blood loss process or poor absorption   2. Suggest ferrous sulphate 325 mg BID for 3 months  3. EGD -please discuss and schedule

## 2017-06-18 NOTE — Telephone Encounter (Signed)
LVM for patient callback with results per Dr. Vicente Males.   1. She is iron deficient and usually indicates a chronic blood loss process or poor absorption   2. Suggest ferrous sulphate 325 mg BID for 3 months   3. EGD -please discuss and schedule

## 2017-06-21 ENCOUNTER — Telehealth: Payer: Self-pay

## 2017-06-21 ENCOUNTER — Inpatient Hospital Stay: Payer: Self-pay | Admitting: Physician Assistant

## 2017-06-21 ENCOUNTER — Telehealth: Payer: Self-pay | Admitting: Gastroenterology

## 2017-06-21 DIAGNOSIS — D539 Nutritional anemia, unspecified: Secondary | ICD-10-CM

## 2017-06-21 MED ORDER — STARCH 51 % RE SUPP: 1.0000 | RECTAL | 0 refills | Status: DC | PRN

## 2017-06-21 MED ORDER — FERROUS SULFATE 325 (65 FE) MG PO TABS: 325.0000 mg | ORAL_TABLET | Freq: Every day | ORAL | 3 refills | Status: DC

## 2017-06-21 NOTE — Telephone Encounter (Signed)
Per patient's message: Patient left a voice message that her rectal bleeding has come back and she needs to know what to do? She would like a call back ASAP,   LVM for patient callback.   Dr. Vicente Males ordered Anusol and a sitz bath.  Follow-up call tomorrow.

## 2017-06-21 NOTE — Telephone Encounter (Signed)
Patient left a voice message that her rectal bleeding has come back and she needs to know what to do? She would like a call back ASAP.

## 2017-06-22 MED ORDER — SITZ BATH MISC: 1.0000 | Freq: Three times a day (TID) | 3 refills | Status: DC

## 2017-06-22 NOTE — Telephone Encounter (Signed)
Advised patient on sitz bath and iron supplement.

## 2017-06-24 ENCOUNTER — Ambulatory Visit (INDEPENDENT_AMBULATORY_CARE_PROVIDER_SITE_OTHER): Payer: 59 | Admitting: Physician Assistant

## 2017-06-24 ENCOUNTER — Encounter: Payer: Self-pay | Admitting: Physician Assistant

## 2017-06-24 VITALS — BP 98/60 | HR 85 | Temp 98.2°F | Resp 16 | Wt 153.2 lb

## 2017-06-24 DIAGNOSIS — K648 Other hemorrhoids: Secondary | ICD-10-CM | POA: Diagnosis not present

## 2017-06-24 DIAGNOSIS — D500 Iron deficiency anemia secondary to blood loss (chronic): Secondary | ICD-10-CM

## 2017-06-24 DIAGNOSIS — K922 Gastrointestinal hemorrhage, unspecified: Secondary | ICD-10-CM | POA: Diagnosis not present

## 2017-06-24 LAB — CBC WITH DIFFERENTIAL/PLATELET
BASOS PCT: 1 %
Basophils Absolute: 49 cells/uL (ref 0–200)
EOS PCT: 3.3 %
Eosinophils Absolute: 162 cells/uL (ref 15–500)
HCT: 23.5 % — ABNORMAL LOW (ref 35.0–45.0)
Hemoglobin: 7.1 g/dL — ABNORMAL LOW (ref 11.7–15.5)
Lymphs Abs: 1044 cells/uL (ref 850–3900)
MCH: 24.4 pg — ABNORMAL LOW (ref 27.0–33.0)
MCHC: 30.2 g/dL — ABNORMAL LOW (ref 32.0–36.0)
MCV: 80.8 fL (ref 80.0–100.0)
MONOS PCT: 9.8 %
MPV: 10.8 fL (ref 7.5–12.5)
NEUTROS PCT: 64.6 %
Neutro Abs: 3165 cells/uL (ref 1500–7800)
Platelets: 246 10*3/uL (ref 140–400)
RBC: 2.91 10*6/uL — AB (ref 3.80–5.10)
RDW: 15.5 % — AB (ref 11.0–15.0)
Total Lymphocyte: 21.3 %
WBC mixed population: 480 cells/uL (ref 200–950)
WBC: 4.9 10*3/uL (ref 3.8–10.8)

## 2017-06-24 MED ORDER — HYDROCORTISONE ACETATE 25 MG RE SUPP: 25.0000 mg | Freq: Two times a day (BID) | RECTAL | 0 refills | Status: DC

## 2017-06-24 NOTE — Progress Notes (Signed)
     Patient: [name] Female    DOB: 01/17/1951   66 y.o.   MRN: [id number] Visit Date: 06/24/2017  Today's Provider:  M , PA-C   No chief complaint on file.  Subjective:    HPI Patient here today for 2 weeks follow-up. Two weeks ago she was seen for bronchitis and hospital follow-up for GI bleed. Per patient she forgot that day to tell Jenni that she was having slight numbness and tingling in lips and tongue. She had labs done. She reports that she feels lightheaded, weak. She started to have the rectal bleeding on Saturday. She denies any straining or constipation. 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 She does report that the rectal bleeding is just "dripping out of me" since Saturday. She is having to wear a pad to catch it. She is taking her iron 325 mg supplement BID. She reports that they have ordered another CT. She is having this done on Monday. She is being followed by GI, Dr. Anna.      Allergies  Allergen Reactions  . Lac Bovis Diarrhea  . Prednisone Diarrhea  . Sulfa Antibiotics Itching  . Zithromax [Azithromycin] Diarrhea     Current Outpatient Prescriptions:  .  b complex vitamins tablet, Take 1 tablet by mouth every other day., Disp: , Rfl:  .  calcitRIOL (ROCALTROL) 0.5 MCG capsule, Take 0.5 mcg by mouth daily., Disp: , Rfl: 5 .  Cholecalciferol (VITAMIN D3) 5000 UNITS CAPS, Take 1 capsule by mouth daily. , Disp: , Rfl:  .  ferrous sulfate 325 (65 FE) MG tablet, Take 1 tablet (325 mg total) by mouth daily with breakfast., Disp: 90 tablet, Rfl: 3 .  fluticasone furoate-vilanterol (BREO ELLIPTA) 100-25 MCG/INH AEPB, Inhale 1 puff into the lungs daily., Disp: 60 each, Rfl: 5 .  Misc. Devices (SITZ BATH) MISC, 1 kit by Does not apply route 3 (three) times daily., Disp: 1 each, Rfl: 3 .  Multiple Vitamin (MULTIVITAMIN) tablet, Take 1 tablet by mouth 2 (two) times daily. , Disp: , Rfl:   .  pantoprazole (PROTONIX) 40 MG tablet, Take 40 mg by mouth every morning., Disp: , Rfl: 3 .  Teriparatide, Recombinant, 600 MCG/2.4ML SOLN, Inject 20 mcg into the skin daily. , Disp: , Rfl:  .  VITAMIN A PO, Take 1 capsule by mouth daily. , Disp: , Rfl:  .  VITAMIN E PO, Take 1 capsule by mouth daily. , Disp: , Rfl:  .  albuterol (VENTOLIN HFA) 108 (90 Base) MCG/ACT inhaler, INHALE 2 PUFFS INTO THE LUNGS EVERY 6 HOURS AS NEEDED (Patient not taking: Reported on 06/17/2017), Disp: 18 g, Rfl: 0 .  furosemide (LASIX) 20 MG tablet, Take 1 tablet (20 mg total) by mouth daily. (Patient not taking: Reported on 05/31/2017), Disp: 30 tablet, Rfl: 3 .  guaiFENesin (MUCINEX) 600 MG 12 hr tablet, Take 1 tablet (600 mg total) by mouth 2 (two) times daily. (Patient not taking: Reported on 06/17/2017), Disp: 30 tablet, Rfl: 0 .  moxifloxacin (AVELOX) 400 MG tablet, Take 1 tablet (400 mg total) by mouth daily at 8 pm. (Patient not taking: Reported on 06/17/2017), Disp: 10 tablet, Rfl: 0 .  potassium chloride (K-DUR) 10 MEQ tablet, Take 1 tablet (10 mEq total) by mouth daily. (Patient not taking: Reported on 05/31/2017), Disp: 30 tablet, Rfl: 3  Review of Systems  Constitutional: Positive for fatigue.  Respiratory: Positive for shortness        Patient: [name] Female    DOB: 01/17/1951   66 y.o.   MRN: [id number] Visit Date: 06/24/2017  Today's Provider:  M , PA-C   No chief complaint on file.  Subjective:    HPI Patient here today for 2 weeks follow-up. Two weeks ago she was seen for bronchitis and hospital follow-up for GI bleed. Per patient she forgot that day to tell Jenni that she was having slight numbness and tingling in lips and tongue. She had labs done. She reports that she feels lightheaded, weak. She started to have the rectal bleeding on Saturday. She denies any straining or constipation. 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 She does report that the rectal bleeding is just "dripping out of me" since Saturday. She is having to wear a pad to catch it. She is taking her iron 325 mg supplement BID. She reports that they have ordered another CT. She is having this done on Monday. She is being followed by GI, Dr. Anna.      Allergies  Allergen Reactions  . Lac Bovis Diarrhea  . Prednisone Diarrhea  . Sulfa Antibiotics Itching  . Zithromax [Azithromycin] Diarrhea     Current Outpatient Prescriptions:  .  b complex vitamins tablet, Take 1 tablet by mouth every other day., Disp: , Rfl:  .  calcitRIOL (ROCALTROL) 0.5 MCG capsule, Take 0.5 mcg by mouth daily., Disp: , Rfl: 5 .  Cholecalciferol (VITAMIN D3) 5000 UNITS CAPS, Take 1 capsule by mouth daily. , Disp: , Rfl:  .  ferrous sulfate 325 (65 FE) MG tablet, Take 1 tablet (325 mg total) by mouth daily with breakfast., Disp: 90 tablet, Rfl: 3 .  fluticasone furoate-vilanterol (BREO ELLIPTA) 100-25 MCG/INH AEPB, Inhale 1 puff into the lungs daily., Disp: 60 each, Rfl: 5 .  Misc. Devices (SITZ BATH) MISC, 1 kit by Does not apply route 3 (three) times daily., Disp: 1 each, Rfl: 3 .  Multiple Vitamin (MULTIVITAMIN) tablet, Take 1 tablet by mouth 2 (two) times daily. , Disp: , Rfl:   .  pantoprazole (PROTONIX) 40 MG tablet, Take 40 mg by mouth every morning., Disp: , Rfl: 3 .  Teriparatide, Recombinant, 600 MCG/2.4ML SOLN, Inject 20 mcg into the skin daily. , Disp: , Rfl:  .  VITAMIN A PO, Take 1 capsule by mouth daily. , Disp: , Rfl:  .  VITAMIN E PO, Take 1 capsule by mouth daily. , Disp: , Rfl:  .  albuterol (VENTOLIN HFA) 108 (90 Base) MCG/ACT inhaler, INHALE 2 PUFFS INTO THE LUNGS EVERY 6 HOURS AS NEEDED (Patient not taking: Reported on 06/17/2017), Disp: 18 g, Rfl: 0 .  furosemide (LASIX) 20 MG tablet, Take 1 tablet (20 mg total) by mouth daily. (Patient not taking: Reported on 05/31/2017), Disp: 30 tablet, Rfl: 3 .  guaiFENesin (MUCINEX) 600 MG 12 hr tablet, Take 1 tablet (600 mg total) by mouth 2 (two) times daily. (Patient not taking: Reported on 06/17/2017), Disp: 30 tablet, Rfl: 0 .  moxifloxacin (AVELOX) 400 MG tablet, Take 1 tablet (400 mg total) by mouth daily at 8 pm. (Patient not taking: Reported on 06/17/2017), Disp: 10 tablet, Rfl: 0 .  potassium chloride (K-DUR) 10 MEQ tablet, Take 1 tablet (10 mEq total) by mouth daily. (Patient not taking: Reported on 05/31/2017), Disp: 30 tablet, Rfl: 3  Review of Systems  Constitutional: Positive for fatigue.  Respiratory: Positive for shortness

## 2017-06-24 NOTE — Patient Instructions (Signed)
Hemorrhoids Hemorrhoids are swollen veins in and around the rectum or anus. There are two types of hemorrhoids:  Internal hemorrhoids. These occur in the veins that are just inside the rectum. They may poke through to the outside and become irritated and painful.  External hemorrhoids. These occur in the veins that are outside of the anus and can be felt as a painful swelling or hard lump near the anus.  Most hemorrhoids do not cause serious problems, and they can be managed with home treatments such as diet and lifestyle changes. If home treatments do not help your symptoms, procedures can be done to shrink or remove the hemorrhoids. What are the causes? This condition is caused by increased pressure in the anal area. This pressure may result from various things, including:  Constipation.  Straining to have a bowel movement.  Diarrhea.  Pregnancy.  Obesity.  Sitting for long periods of time.  Heavy lifting or other activity that causes you to strain.  Anal sex.  What are the signs or symptoms? Symptoms of this condition include:  Pain.  Anal itching or irritation.  Rectal bleeding.  Leakage of stool (feces).  Anal swelling.  One or more lumps around the anus.  How is this diagnosed? This condition can often be diagnosed through a visual exam. Other exams or tests may also be done, such as:  Examination of the rectal area with a gloved hand (digital rectal exam).  Examination of the anal canal using a small tube (anoscope).  A blood test, if you have lost a significant amount of blood.  A test to look inside the colon (sigmoidoscopy or colonoscopy).  How is this treated? This condition can usually be treated at home. However, various procedures may be done if dietary changes, lifestyle changes, and other home treatments do not help your symptoms. These procedures can help make the hemorrhoids smaller or remove them completely. Some of these procedures involve  surgery, and others do not. Common procedures include:  Rubber band ligation. Rubber bands are placed at the base of the hemorrhoids to cut off the blood supply to them.  Sclerotherapy. Medicine is injected into the hemorrhoids to shrink them.  Infrared coagulation. A type of light energy is used to get rid of the hemorrhoids.  Hemorrhoidectomy surgery. The hemorrhoids are surgically removed, and the veins that supply them are tied off.  Stapled hemorrhoidopexy surgery. A circular stapling device is used to remove the hemorrhoids and use staples to cut off the blood supply to them.  Follow these instructions at home: Eating and drinking  Eat foods that have a lot of fiber in them, such as whole grains, beans, nuts, fruits, and vegetables. Ask your health care provider about taking products that have added fiber (fiber supplements).  Drink enough fluid to keep your urine clear or pale yellow. Managing pain and swelling  Take warm sitz baths for 20 minutes, 3-4 times a day to ease pain and discomfort.  If directed, apply ice to the affected area. Using ice packs between sitz baths may be helpful. ? Put ice in a plastic bag. ? Place a towel between your skin and the bag. ? Leave the ice on for 20 minutes, 2-3 times a day. General instructions  Take over-the-counter and prescription medicines only as told by your health care provider.  Use medicated creams or suppositories as told.  Exercise regularly.  Go to the bathroom when you have the urge to have a bowel movement. Do not wait.    Avoid straining to have bowel movements.  Keep the anal area dry and clean. Use wet toilet paper or moist towelettes after a bowel movement.  Do not sit on the toilet for long periods of time. This increases blood pooling and pain. Contact a health care provider if:  You have increasing pain and swelling that are not controlled by treatment or medicine.  You have uncontrolled bleeding.  You  have difficulty having a bowel movement, or you are unable to have a bowel movement.  You have pain or inflammation outside the area of the hemorrhoids. This information is not intended to replace advice given to you by your health care provider. Make sure you discuss any questions you have with your health care provider. Document Released: 08/28/2000 Document Revised: 01/29/2016 Document Reviewed: 05/15/2015 Elsevier Interactive Patient Education  2017 [employer]  

## 2017-06-25 ENCOUNTER — Ambulatory Visit: Payer: 59

## 2017-06-25 ENCOUNTER — Telehealth: Payer: Self-pay

## 2017-06-25 ENCOUNTER — Encounter: Payer: Self-pay | Admitting: Gastroenterology

## 2017-06-25 DIAGNOSIS — D500 Iron deficiency anemia secondary to blood loss (chronic): Secondary | ICD-10-CM

## 2017-06-25 NOTE — Telephone Encounter (Signed)
Patient advised as below.  

## 2017-06-25 NOTE — Telephone Encounter (Signed)
Error

## 2017-06-25 NOTE — Telephone Encounter (Signed)
-----   Message from Mar Daring, PA-C sent at 06/25/2017  8:25 AM EDT ----- HgB down to 7.1. I am referring to hematology for urgent referral.

## 2017-06-28 ENCOUNTER — Encounter (HOSPITAL_COMMUNITY): Payer: 59

## 2017-06-28 ENCOUNTER — Encounter (HOSPITAL_COMMUNITY): Payer: 59 | Attending: Oncology | Admitting: Oncology

## 2017-06-28 ENCOUNTER — Ambulatory Visit (HOSPITAL_COMMUNITY)
Admission: RE | Admit: 2017-06-28 | Discharge: 2017-06-28 | Disposition: A | Discharge status: Home / Self Care | Payer: 59 | Source: Ambulatory Visit | Attending: Gastroenterology | Admitting: Gastroenterology

## 2017-06-28 ENCOUNTER — Encounter (HOSPITAL_COMMUNITY): Payer: Self-pay

## 2017-06-28 VITALS — BP 113/65 | HR 86 | Temp 97.5°F | Resp 20 | Ht 64.0 in | Wt 152.0 lb

## 2017-06-28 DIAGNOSIS — Z9884 Bariatric surgery status: Secondary | ICD-10-CM | POA: Diagnosis not present

## 2017-06-28 DIAGNOSIS — K625 Hemorrhage of anus and rectum: Secondary | ICD-10-CM | POA: Diagnosis not present

## 2017-06-28 DIAGNOSIS — K629 Disease of anus and rectum, unspecified: Secondary | ICD-10-CM | POA: Insufficient documentation

## 2017-06-28 DIAGNOSIS — D259 Leiomyoma of uterus, unspecified: Secondary | ICD-10-CM | POA: Diagnosis not present

## 2017-06-28 DIAGNOSIS — Z9049 Acquired absence of other specified parts of digestive tract: Secondary | ICD-10-CM | POA: Diagnosis not present

## 2017-06-28 DIAGNOSIS — K922 Gastrointestinal hemorrhage, unspecified: Secondary | ICD-10-CM

## 2017-06-28 DIAGNOSIS — R1907 Generalized intra-abdominal and pelvic swelling, mass and lump: Secondary | ICD-10-CM | POA: Insufficient documentation

## 2017-06-28 DIAGNOSIS — N83202 Unspecified ovarian cyst, left side: Secondary | ICD-10-CM | POA: Diagnosis not present

## 2017-06-28 DIAGNOSIS — N281 Cyst of kidney, acquired: Secondary | ICD-10-CM | POA: Diagnosis not present

## 2017-06-28 DIAGNOSIS — K6289 Other specified diseases of anus and rectum: Secondary | ICD-10-CM

## 2017-06-28 DIAGNOSIS — D500 Iron deficiency anemia secondary to blood loss (chronic): Secondary | ICD-10-CM

## 2017-06-28 LAB — CBC WITH DIFFERENTIAL/PLATELET
Basophils Absolute: 0 10*3/uL (ref 0.0–0.1)
Basophils Relative: 1 %
EOS ABS: 0.2 10*3/uL (ref 0.0–0.7)
Eosinophils Relative: 3 %
HCT: 24.4 % — ABNORMAL LOW (ref 36.0–46.0)
HEMOGLOBIN: 7.3 g/dL — AB (ref 12.0–15.0)
LYMPHS ABS: 1.1 10*3/uL (ref 0.7–4.0)
LYMPHS PCT: 22 %
MCH: 24.3 pg — AB (ref 26.0–34.0)
MCHC: 29.9 g/dL — AB (ref 30.0–36.0)
MCV: 81.3 fL (ref 78.0–100.0)
MONOS PCT: 11 %
Monocytes Absolute: 0.5 10*3/uL (ref 0.1–1.0)
NEUTROS PCT: 63 %
Neutro Abs: 3.2 10*3/uL (ref 1.7–7.7)
Platelets: 218 10*3/uL (ref 150–400)
RBC: 3 MIL/uL — ABNORMAL LOW (ref 3.87–5.11)
RDW: 16.4 % — ABNORMAL HIGH (ref 11.5–15.5)
WBC: 5.1 10*3/uL (ref 4.0–10.5)

## 2017-06-28 LAB — ABO/RH: ABO/RH(D): O POS

## 2017-06-28 LAB — PREPARE RBC (CROSSMATCH)

## 2017-06-28 MED ORDER — IOPAMIDOL (ISOVUE-300) INJECTION 61%
100.0000 mL | Freq: Once | INTRAVENOUS | Status: AC | PRN
  Administered 2017-06-28: 100 mL via INTRAVENOUS

## 2017-06-28 NOTE — Patient Instructions (Signed)
[facility]  Discharge Instructions:  You were seen by Dr. Zhou today. _______________________________________________________________  Thank you for choosing [facility] at [facility] to provide your oncology and hematology care.  To afford each patient quality time with our providers, please arrive at least 15 minutes before your scheduled appointment.  You need to re-schedule your appointment if you arrive 10 or more minutes late.  We strive to give you quality time with our providers, and arriving late affects you and other patients whose appointments are after yours.  Also, if you no show three or more times for appointments you may be dismissed from the clinic.  Again, thank you for choosing [facility]. Our hope is that these requests will allow you access to exceptional care and in a timely manner. _______________________________________________________________  If you have questions after your visit, please contact our office at [phone] between the hours of 8:30 a.m. and 5:00 p.m. Voicemails left after 4:30 p.m. will not be returned until the following business day. _______________________________________________________________  For prescription refill requests, have your pharmacy contact our office. _______________________________________________________________  Recommendations made by the consultant and any test results will be sent to your referring physician. _______________________________________________________________ 

## 2017-06-28 NOTE — Progress Notes (Signed)
[facility] Initial Visit:  Patient Care Team: Mar Daring, PA-C as PCP - General (Family Medicine)  CHIEF COMPLAINTS/PURPOSE OF CONSULTATION:  Symptomatic anemia Iron deficiency anemia due to chronic blood loss Lower GI bleed with BRBPR  HISTORY OF PRESENTING ILLNESS: [name] 66 y.o. female presents today with her husband for evaluation of iron deficiency anemia. Patient has been having ongoing bright red blood per rectum since approximately 05/28/2017. She states that she's has a history of gastric bypass in 2003 and since that she's had chronically 6-8 bowel movements a day. Now every time she has a bowel movement she loses fresh blood as well. The blood is not mixed in with the stool. Patient continues to have ongoing bleeding at this time.   She was admitted into the hospital on 05/31/2017 for this issue. At that time her hemoglobin was 9.8 g/dL, hematocrit 30.5%, MCV 85.9.   CT abdomen pelvis with contrast on 05/31/2017 demonstrated a 2.8 x 3.2 heterogeneously enhancing lobulated mass in the rectum.   Colonoscopy 06/02/17 by Dr.Kiran Vicente Males demonstrated that the the perianal and digital rectal examinations were normal. A few small-mouthed diverticula were found in the sigmoid colon. Non-bleeding internal hemorrhoids were found during retroflexion. The hemorrhoids were small and Grade I (internal hemorrhoids that do not prolapse). The exam was otherwise without abnormality on direct and retroflexion views.   Iron studies from 06/17/2017 demonstrated iron 11, TIBC 405, ferritin 3. Folate was 19.4, vitamin B12 1183. Most recent CBC from 06/24/2017 demonstrated a hemoglobin 7.1 g/dL, hematocrit 23.5%, MCV 80.8.  Patient states that she feels very fatigued. She easily gets short of breath with any exertion. She also has been having palpitations and dizziness. She has chronic insomnia. She bruises easily. She denies any pain. Otherwise review of systems was  negative.  Review of Systems - Oncology ROS as per HPI otherwise 12 point ROS is negative.  MEDICAL HISTORY: Past Medical History:  Diagnosis Date  . Asthma   . GERD (gastroesophageal reflux disease)   . Osteoporosis   . Peptic ulcer     SURGICAL HISTORY: Past Surgical History:  Procedure Laterality Date  . BACK SURGERY    . BREAST SURGERY    . CHOLECYSTECTOMY    . COLONOSCOPY WITH PROPOFOL N/A 06/02/2017   Procedure: COLONOSCOPY WITH PROPOFOL;  Surgeon: Jonathon Bellows, MD;  Location[facility] ENDOSCOPY;  Service: Gastroenterology;  Laterality: N/A;  . KNEE SURGERY    . mini gastric bypass    . SHOULDER SURGERY    . TONSILLECTOMY      SOCIAL HISTORY: Social History   Social History  . Marital status: Married    Spouse name: N/A  . Number of children: N/A  . Years of education: N/A   Occupational History  . Not on file.   Social History Main Topics  . Smoking status: Never Smoker  . Smokeless tobacco: Never Used  . Alcohol use No  . Drug use: No  . Sexual activity: No   Other Topics Concern  . Not on file   Social History Narrative  . No narrative on file    FAMILY HISTORY Family History  Problem Relation Age of Onset  . Cancer Other   . Hypertension Other   . Stroke Other   . Diabetes Other   . Heart attack Other   . Obesity Other     ALLERGIES:  is allergic to lac bovis; prednisone; sulfa antibiotics; and zithromax [azithromycin].  MEDICATIONS:  Current  Outpatient Prescriptions  Medication Sig Dispense Refill  . albuterol (VENTOLIN HFA) 108 (90 Base) MCG/ACT inhaler INHALE 2 PUFFS INTO THE LUNGS EVERY 6 HOURS AS NEEDED 18 g 0  . b complex vitamins tablet Take 1 tablet by mouth every other day.    . calcitRIOL (ROCALTROL) 0.5 MCG capsule Take 0.5 mcg by mouth daily.  5  . Cholecalciferol (VITAMIN D3) 5000 UNITS CAPS Take 1 capsule by mouth daily.     . ferrous sulfate 325 (65 FE) MG tablet Take 1 tablet (325 mg total) by mouth daily with breakfast. 90  tablet 3  . fluticasone furoate-vilanterol (BREO ELLIPTA) 100-25 MCG/INH AEPB Inhale 1 puff into the lungs daily. 60 each 5  . furosemide (LASIX) 20 MG tablet Take 1 tablet (20 mg total) by mouth daily. 30 tablet 3  . hydrocortisone (ANUSOL-HC) 25 MG suppository Place 1 suppository (25 mg total) rectally 2 (two) times daily. 12 suppository 0  . Misc. Devices (SITZ BATH) MISC 1 kit by Does not apply route 3 (three) times daily. 1 each 3  . Multiple Vitamin (MULTIVITAMIN) tablet Take 1 tablet by mouth 2 (two) times daily.     . pantoprazole (PROTONIX) 40 MG tablet Take 40 mg by mouth every morning.  3  . potassium chloride (K-DUR) 10 MEQ tablet Take 1 tablet (10 mEq total) by mouth daily. 30 tablet 3  . Teriparatide, Recombinant, 600 MCG/2.4ML SOLN Inject 20 mcg into the skin daily.     Marland Kitchen VITAMIN A PO Take 1 capsule by mouth daily.     Marland Kitchen VITAMIN E PO Take 1 capsule by mouth daily.      No current facility-administered medications for this visit.     PHYSICAL EXAMINATION:   Vitals:   06/28/17 1123  BP: 113/65  Pulse: 86  Resp: 20  Temp: (!) 97.5 F (36.4 C)  SpO2: 99%    Filed Weights   06/28/17 1123  Weight: 152 lb (68.9 kg)     Physical Exam Constitutional: Well-developed, well-nourished, and in no distress.  Pale female. HENT:  Head: Normocephalic and atraumatic.  Mouth/Throat: No oropharyngeal exudate. Mucosa moist. Eyes: Pupils are equal, round, and reactive to light. Conjunctivae are normal. No scleral icterus.  Neck: Normal range of motion. Neck supple. No JVD present.  Cardiovascular: Normal rate, regular rhythm and normal heart sounds.  Exam reveals no gallop and no friction rub.   No murmur heard. Pulmonary/Chest: Effort normal and breath sounds normal. No respiratory distress. No wheezes.No rales.  Abdominal: Soft. Bowel sounds are normal. No distension. Tenderness to palpation over epigastrium/LUQ. There is no guarding.  Musculoskeletal: Chronic lymph edema  bandaged in bilateral LE, no tenderness.  Lymphadenopathy:    No cervical or supraclavicular adenopathy.  Neurological: Alert and oriented to person, place, and time. No cranial nerve deficit.  Skin: Skin is warm and dry. No rash noted. No erythema. No pallor.  Psychiatric: Affect and judgment normal.    LABORATORY DATA: I have personally reviewed the data as listed:  Office Visit on 06/24/2017  Component Date Value Ref Range Status  . WBC 06/24/2017 4.9  3.8 - 10.8 Thousand/uL Final  . RBC 06/24/2017 2.91* 3.80 - 5.10 Million/uL Final  . Hemoglobin 06/24/2017 7.1* 11.7 - 15.5 g/dL Final  . HCT 06/24/2017 23.5* 35.0 - 45.0 % Final  . MCV 06/24/2017 80.8  80.0 - 100.0 fL Final  . MCH 06/24/2017 24.4* 27.0 - 33.0 pg Final  . MCHC 06/24/2017 30.2* 32.0 - 36.0 g/dL  Final  . RDW 06/24/2017 15.5* 11.0 - 15.0 % Final  . Platelets 06/24/2017 246  140 - 400 Thousand/uL Final  . MPV 06/24/2017 10.8  7.5 - 12.5 fL Final  . Neutro Abs 06/24/2017 3165  1,500 - 7,800 cells/uL Final  . Lymphs Abs 06/24/2017 1044  850 - 3,900 cells/uL Final  . WBC mixed population 06/24/2017 480  200 - 950 cells/uL Final  . Eosinophils Absolute 06/24/2017 162  15 - 500 cells/uL Final  . Basophils Absolute 06/24/2017 49  0 - 200 cells/uL Final  . Neutrophils Relative % 06/24/2017 64.6  % Final  . Total Lymphocyte 06/24/2017 21.3  % Final  . Monocytes Relative 06/24/2017 9.8  % Final  . Eosinophils Relative 06/24/2017 3.3  % Final  . Basophils Relative 06/24/2017 1.0  % Final  Hospital Outpatient Visit on 06/17/2017  Component Date Value Ref Range Status  . Sodium 06/17/2017 141  135 - 145 mmol/L Final  . Potassium 06/17/2017 4.3  3.5 - 5.1 mmol/L Final  . Chloride 06/17/2017 109  101 - 111 mmol/L Final  . CO2 06/17/2017 25  22 - 32 mmol/L Final  . Glucose, Bld 06/17/2017 91  65 - 99 mg/dL Final  . BUN 06/17/2017 16  6 - 20 mg/dL Final  . Creatinine, Ser 06/17/2017 0.62  0.44 - 1.00 mg/dL Final  . Calcium  06/17/2017 8.2* 8.9 - 10.3 mg/dL Final  . GFR calc non Af Amer 06/17/2017 >60  >60 mL/min Final  . GFR calc Af Amer 06/17/2017 >60  >60 mL/min Final   Comment: (NOTE) The eGFR has been calculated using the CKD EPI equation. This calculation has not been validated in all clinical situations. eGFR's persistently <60 mL/min signify possible Chronic Kidney Disease.   . Anion gap 06/17/2017 7  5 - 15 Final  . Iron 06/17/2017 11* 28 - 170 ug/dL Final  . TIBC 06/17/2017 405  250 - 450 ug/dL Final  . Saturation Ratios 06/17/2017 3* 10.4 - 31.8 % Final  . UIBC 06/17/2017 394  ug/dL Final  . Ferritin 06/17/2017 3* 11 - 307 ng/mL Final  . Vitamin B-12 06/17/2017 1183* 180 - 914 pg/mL Final   Comment: (NOTE) This assay is not validated for testing neonatal or myeloproliferative syndrome specimens for Vitamin B12 levels. Performed at [facility], [place] [address] [place], [place] [address]   . Folate 06/17/2017 19.4  >5.9 ng/mL Final  . Color, Urine 06/17/2017 YELLOW* YELLOW Final  . APPearance 06/17/2017 CLEAR* CLEAR Final  . Specific Gravity, Urine 06/17/2017 1.017  1.005 - 1.030 Final  . pH 06/17/2017 5.0  5.0 - 8.0 Final  . Glucose, UA 06/17/2017 NEGATIVE  NEGATIVE mg/dL Final  . Hgb urine dipstick 06/17/2017 NEGATIVE  NEGATIVE Final  . Bilirubin Urine 06/17/2017 NEGATIVE  NEGATIVE Final  . Ketones, ur 06/17/2017 NEGATIVE  NEGATIVE mg/dL Final  . Protein, ur 06/17/2017 NEGATIVE  NEGATIVE mg/dL Final  . Nitrite 06/17/2017 NEGATIVE  NEGATIVE Final  . Leukocytes, UA 06/17/2017 NEGATIVE  NEGATIVE Final  Office Visit on 06/07/2017  Component Date Value Ref Range Status  . WBC 06/07/2017 5.9  3.8 - 10.8 Thousand/uL Final  . RBC 06/07/2017 3.15* 3.80 - 5.10 Million/uL Final  . Hemoglobin 06/07/2017 8.4* 11.7 - 15.5 g/dL Final  . HCT 06/07/2017 27.2* 35.0 - 45.0 % Final  . MCV 06/07/2017 86.3  80.0 - 100.0 fL Final  . MCH 06/07/2017 26.7* 27.0 - 33.0 pg Final  . MCHC 06/07/2017 30.9*  32.0 - 36.0 g/dL Final  . RDW 06/07/2017 14.1  11.0 - 15.0 % Final  . Platelets 06/07/2017 260  140 - 400 Thousand/uL Final  . MPV 06/07/2017 11.0  7.5 - 12.5 fL Final  . Neutro Abs 06/07/2017 4083  1,500 - 7,800 cells/uL Final  . Lymphs Abs 06/07/2017 1086  850 - 3,900 cells/uL Final  . WBC mixed population 06/07/2017 596  200 - 950 cells/uL Final  . Eosinophils Absolute 06/07/2017 118  15 - 500 cells/uL Final  . Basophils Absolute 06/07/2017 18  0 - 200 cells/uL Final  . Neutrophils Relative % 06/07/2017 69.2  % Final  . Total Lymphocyte 06/07/2017 18.4  % Final  . Monocytes Relative 06/07/2017 10.1  % Final  . Eosinophils Relative 06/07/2017 2.0  % Final  . Basophils Relative 06/07/2017 0.3  % Final  Admission on 05/31/2017, Discharged on 06/03/2017  Component Date Value Ref Range Status  . Sodium 05/31/2017 136  135 - 145 mmol/L Final  . Potassium 05/31/2017 4.4  3.5 - 5.1 mmol/L Final  . Chloride 05/31/2017 102  101 - 111 mmol/L Final  . CO2 05/31/2017 27  22 - 32 mmol/L Final  . Glucose, Bld 05/31/2017 83  65 - 99 mg/dL Final  . BUN 05/31/2017 15  6 - 20 mg/dL Final  . Creatinine, Ser 05/31/2017 0.56  0.44 - 1.00 mg/dL Final  . Calcium 05/31/2017 8.5* 8.9 - 10.3 mg/dL Final  . Total Protein 05/31/2017 5.6* 6.5 - 8.1 g/dL Final  . Albumin 05/31/2017 3.1* 3.5 - 5.0 g/dL Final  . AST 05/31/2017 30  15 - 41 U/L Final  . ALT 05/31/2017 23  14 - 54 U/L Final  . Alkaline Phosphatase 05/31/2017 73  38 - 126 U/L Final  . Total Bilirubin 05/31/2017 0.7  0.3 - 1.2 mg/dL Final  . GFR calc non Af Amer 05/31/2017 >60  >60 mL/min Final  . GFR calc Af Amer 05/31/2017 >60  >60 mL/min Final   Comment: (NOTE) The eGFR has been calculated using the CKD EPI equation. This calculation has not been validated in all clinical situations. eGFR's persistently <60 mL/min signify possible Chronic Kidney Disease.   . Anion gap 05/31/2017 7  5 - 15 Final  . WBC 05/31/2017 7.5  3.6 - 11.0 K/uL Final  .  RBC 05/31/2017 3.55* 3.80 - 5.20 MIL/uL Final  . Hemoglobin 05/31/2017 9.8* 12.0 - 16.0 g/dL Final  . HCT 05/31/2017 30.5* 35.0 - 47.0 % Final  . MCV 05/31/2017 85.9  80.0 - 100.0 fL Final  . MCH 05/31/2017 27.6  26.0 - 34.0 pg Final  . MCHC 05/31/2017 32.1  32.0 - 36.0 g/dL Final  . RDW 05/31/2017 16.1* 11.5 - 14.5 % Final  . Platelets 05/31/2017 267  150 - 440 K/uL Final  . ABO/RH(D) 05/31/2017 O POS   Final  . Antibody Screen 05/31/2017 NEG   Final  . Sample Expiration 05/31/2017 06/03/2017   Final  . HIV Screen 4th Generation wRfx 06/01/2017 Non Reactive  Non Reactive Final   Comment: (NOTE) Performed At: [facility] [place], [place] [id number] Lindon Romp MD TD:[id number]   . Sodium 06/01/2017 139  135 - 145 mmol/L Final  . Potassium 06/01/2017 4.0  3.5 - 5.1 mmol/L Final  . Chloride 06/01/2017 104  101 - 111 mmol/L Final  . CO2 06/01/2017 29  22 - 32 mmol/L Final  . Glucose, Bld 06/01/2017 82  65 - 99 mg/dL Final  .  BUN 06/01/2017 13  6 - 20 mg/dL Final  . Creatinine, Ser 06/01/2017 0.68  0.44 - 1.00 mg/dL Final  . Calcium 06/01/2017 7.8* 8.9 - 10.3 mg/dL Final  . GFR calc non Af Amer 06/01/2017 >60  >60 mL/min Final  . GFR calc Af Amer 06/01/2017 >60  >60 mL/min Final   Comment: (NOTE) The eGFR has been calculated using the CKD EPI equation. This calculation has not been validated in all clinical situations. eGFR's persistently <60 mL/min signify possible Chronic Kidney Disease.   . Anion gap 06/01/2017 6  5 - 15 Final  . WBC 06/01/2017 4.8  3.6 - 11.0 K/uL Final  . RBC 06/01/2017 3.02* 3.80 - 5.20 MIL/uL Final  . Hemoglobin 06/01/2017 8.6* 12.0 - 16.0 g/dL Final  . HCT 06/01/2017 26.3* 35.0 - 47.0 % Final  . MCV 06/01/2017 87.2  80.0 - 100.0 fL Final  . MCH 06/01/2017 28.5  26.0 - 34.0 pg Final  . MCHC 06/01/2017 32.7  32.0 - 36.0 g/dL Final  . RDW 06/01/2017 16.9* 11.5 - 14.5 % Final  . Platelets 06/01/2017 206  150 - 440 K/uL Final  .  Hemoglobin 05/31/2017 8.6* 12.0 - 16.0 g/dL Final  . Hemoglobin 06/01/2017 9.2* 12.0 - 16.0 g/dL Final  . Hemoglobin 06/02/2017 8.3* 12.0 - 16.0 g/dL Final  Office Visit on 05/31/2017  Component Date Value Ref Range Status  . IFOBT 05/31/2017 Positive   Final    RADIOGRAPHIC STUDIES: I have personally reviewed the radiological images as listed and agree with the findings in the report  No results found.  ASSESSMENT: Symptomatic anemia with most recent hemoglobin 7.1 g/dL on 06/24/17. Iron deficiency anemia due to chronic blood loss, ferritin 3. Lower GI bleed with BRBPR History of gastric bypass in 2003  PLAN: -I have discussed giving patient 2 units pRBC stat and she is agreeable to proceeding. -Also discussed giving her 2 doses of feraheme with the patient and discussed potential side effects and she is willing to proceed. Will get first dose set up this week. Maintain ferritin above 100. -Given her ongoing GI bleeding, I have set up for weekly CBCs x 4 so that we may monitor her hemoglobin and transfuse if hemoglobin below 7. -She has a repeat CT abd/pelvis with contrast scheduled for today for continued evaluation of her GI bleed. -EGD scheduled for 07/09/17 with GI. -Given her history of gastric bypass, she will not be able to absorb oral iron or B12. She will always need B12 injections if she becomes B12 deficient and also IV iron infusions if her iron gets too low. -RTC in 4 weeks for follow up with repeat labs as stated below.  Orders Placed This Encounter  Procedures  . CBC with Differential    Standing Status:   Standing    Number of Occurrences:   4    Standing Expiration Date:   06/28/2018  . Comprehensive metabolic panel    Standing Status:   Future    Standing Expiration Date:   06/28/2018  . Iron and TIBC    Standing Status:   Future    Standing Expiration Date:   06/28/2018  . Ferritin    Standing Status:   Future    Standing Expiration Date:   06/28/2018  .  Erythropoietin    Standing Status:   Future    Standing Expiration Date:   06/28/2018  . Anti-parietal antibody    Standing Status:   Future    Standing Expiration  Date:   06/28/2018  . Intrinsic Factor Antibodies    Standing Status:   Future    Standing Expiration Date:   06/28/2018  . Lactate dehydrogenase    Standing Status:   Future    Standing Expiration Date:   06/28/2018  . Ambulatory referral to Social Work    Referral Priority:   Routine    Referral Type:   Consultation    Referral Reason:   Specialty Services Required    Number of Visits Requested:   1    All questions were answered. The patient knows to call the clinic with any problems, questions or concerns.  This note was electronically signed.    Twana First, MD  06/28/2017 11:49 AM

## 2017-06-29 ENCOUNTER — Other Ambulatory Visit: Payer: Self-pay

## 2017-06-29 DIAGNOSIS — D500 Iron deficiency anemia secondary to blood loss (chronic): Secondary | ICD-10-CM

## 2017-06-29 NOTE — Addendum Note (Signed)
Addended by: Peggye Ley on: 06/29/2017 11:53 AM   Modules accepted: Orders

## 2017-06-30 ENCOUNTER — Encounter (HOSPITAL_BASED_OUTPATIENT_CLINIC_OR_DEPARTMENT_OTHER): Payer: 59

## 2017-06-30 ENCOUNTER — Encounter (HOSPITAL_COMMUNITY): Payer: 59

## 2017-06-30 ENCOUNTER — Encounter (HOSPITAL_COMMUNITY): Payer: Self-pay

## 2017-06-30 ENCOUNTER — Telehealth: Payer: Self-pay | Admitting: Gastroenterology

## 2017-06-30 VITALS — BP 118/63 | HR 68 | Temp 97.8°F | Resp 18 | Wt 154.6 lb

## 2017-06-30 DIAGNOSIS — D500 Iron deficiency anemia secondary to blood loss (chronic): Secondary | ICD-10-CM

## 2017-06-30 DIAGNOSIS — K922 Gastrointestinal hemorrhage, unspecified: Secondary | ICD-10-CM | POA: Diagnosis not present

## 2017-06-30 DIAGNOSIS — K6289 Other specified diseases of anus and rectum: Secondary | ICD-10-CM

## 2017-06-30 MED ORDER — SODIUM CHLORIDE 0.9 % IV SOLN
250.0000 mL | Freq: Once | INTRAVENOUS | Status: AC
  Administered 2017-06-30: 250 mL via INTRAVENOUS

## 2017-06-30 MED ORDER — DIPHENHYDRAMINE HCL 25 MG PO CAPS
25.0000 mg | ORAL_CAPSULE | Freq: Once | ORAL | Status: AC
  Administered 2017-06-30: 25 mg via ORAL
  Filled 2017-06-30: qty 1

## 2017-06-30 MED ORDER — SODIUM CHLORIDE 0.9 % IV SOLN
Freq: Once | INTRAVENOUS | Status: AC
Start: 2017-06-30 — End: 2017-06-30
  Administered 2017-06-30: 10:00:00 via INTRAVENOUS

## 2017-06-30 MED ORDER — FERUMOXYTOL INJECTION 510 MG/17 ML
510.0000 mg | Freq: Once | INTRAVENOUS | Status: AC
  Administered 2017-06-30: 510 mg via INTRAVENOUS
  Filled 2017-06-30: qty 17

## 2017-06-30 MED ORDER — ACETAMINOPHEN 325 MG PO TABS
650.0000 mg | ORAL_TABLET | Freq: Once | ORAL | Status: AC
  Administered 2017-06-30: 650 mg via ORAL
  Filled 2017-06-30: qty 2

## 2017-06-30 MED ORDER — SODIUM CHLORIDE 0.9% FLUSH
10.0000 mL | INTRAVENOUS | Status: AC | PRN
  Administered 2017-06-30: 10 mL

## 2017-06-30 NOTE — Patient Instructions (Signed)
[place] at [facility] Discharge Instructions  RECOMMENDATIONS MADE BY THE CONSULTANT AND ANY TEST RESULTS WILL BE SENT TO YOUR REFERRING PHYSICIAN.  Received Feraheme infusion and Blood transfusion today. Follow-up as scheduled. Call clinic for any questions or concerns  Thank you for choosing [place] at [facility] to provide your oncology and hematology care.  To afford each patient quality time with our provider, please arrive at least 15 minutes before your scheduled appointment time.    If you have a lab appointment with the [facility] please come in thru the  Main Entrance and check in at the main information desk  You need to re-schedule your appointment should you arrive 10 or more minutes late.  We strive to give you quality time with our providers, and arriving late affects you and other patients whose appointments are after yours.  Also, if you no show three or more times for appointments you may be dismissed from the clinic at the providers discretion.     Again, thank you for choosing [facility].  Our hope is that these requests will decrease the amount of time that you wait before being seen by our physicians.       _____________________________________________________________  Should you have questions after your visit to [facility], please contact our office at [phone] between the hours of 8:30 a.m. and 4:30 p.m.  Voicemails left after 4:30 p.m. will not be returned until the following business day.  For prescription refill requests, have your pharmacy contact our office.       Resources For Cancer Patients and their Caregivers ? American Cancer Society: Can assist with transportation, wigs, general needs, runs Look Good Feel Better.        775-228-0608 ? Cancer Care: Provides financial assistance, online support groups, medication/co-pay assistance.  1-800-813-HOPE (331)734-2378) ? [employer] Assists [place] cancer patients and their families through emotional , educational and financial support.  782-365-4941 ? [place] DSS Where to apply for food stamps, Medicaid and utility assistance. (737)741-7595 ? [employer]: Transportation to medical appointments. (213) 698-6706 ? Social Security Administration: May apply for disability if have a Stage IV cancer. 234-880-2547 641 263 6694 ? [employer], Disability and Transit Services: Assists with nutrition, care and transit needs. [place] Support Programs: @10RELATIVEDAYS @ > Cancer Support Group  2nd Tuesday of the month 1pm-2pm, Journey Room  > Creative Journey  3rd Tuesday of the month 1130am-1pm, Journey Room  > Look Good Feel Better  1st Wednesday of the month 10am-12 noon, Journey Room (Call [place] to register [phone])

## 2017-06-30 NOTE — Telephone Encounter (Signed)
Patient called and would like for you to call her and leave a message with results. She will be at [facility] all day.

## 2017-06-30 NOTE — Progress Notes (Signed)
[name] tolerated Feraheme infusion and blood transfusion well without complaints or incident. Multiple IV attempts were needed to get her IV started this am with a few bruises noted after these attempts but pt tolerated well. VSS upon discharge. Pt discharged via wheelchair in satisfactory condition accompanied by her husband

## 2017-07-01 ENCOUNTER — Ambulatory Visit: Payer: 59 | Admitting: Anesthesiology

## 2017-07-01 ENCOUNTER — Encounter
Admission: RE | Disposition: A | Discharge status: Home / Self Care | Payer: Self-pay | Source: Ambulatory Visit | Attending: Gastroenterology

## 2017-07-01 ENCOUNTER — Ambulatory Visit
Admission: RE | Admit: 2017-07-01 | Discharge: 2017-07-01 | Disposition: A | Discharge status: Home / Self Care | Payer: 59 | Source: Ambulatory Visit | Attending: Gastroenterology | Admitting: Gastroenterology

## 2017-07-01 ENCOUNTER — Telehealth: Payer: Self-pay

## 2017-07-01 DIAGNOSIS — D509 Iron deficiency anemia, unspecified: Secondary | ICD-10-CM

## 2017-07-01 DIAGNOSIS — K219 Gastro-esophageal reflux disease without esophagitis: Secondary | ICD-10-CM | POA: Diagnosis not present

## 2017-07-01 DIAGNOSIS — D500 Iron deficiency anemia secondary to blood loss (chronic): Secondary | ICD-10-CM

## 2017-07-01 DIAGNOSIS — Z79899 Other long term (current) drug therapy: Secondary | ICD-10-CM | POA: Insufficient documentation

## 2017-07-01 DIAGNOSIS — K641 Second degree hemorrhoids: Secondary | ICD-10-CM | POA: Diagnosis not present

## 2017-07-01 DIAGNOSIS — K648 Other hemorrhoids: Secondary | ICD-10-CM

## 2017-07-01 DIAGNOSIS — K921 Melena: Secondary | ICD-10-CM | POA: Insufficient documentation

## 2017-07-01 DIAGNOSIS — J45909 Unspecified asthma, uncomplicated: Secondary | ICD-10-CM | POA: Diagnosis not present

## 2017-07-01 DIAGNOSIS — Z8711 Personal history of peptic ulcer disease: Secondary | ICD-10-CM | POA: Diagnosis not present

## 2017-07-01 DIAGNOSIS — Z9884 Bariatric surgery status: Secondary | ICD-10-CM | POA: Insufficient documentation

## 2017-07-01 HISTORY — PX: FLEXIBLE SIGMOIDOSCOPY: SHX5431

## 2017-07-01 HISTORY — PX: ESOPHAGOGASTRODUODENOSCOPY (EGD) WITH PROPOFOL: SHX5813

## 2017-07-01 LAB — TYPE AND SCREEN
ABO/RH(D): O POS
Antibody Screen: NEGATIVE
Unit division: 0
Unit division: 0

## 2017-07-01 LAB — BPAM RBC
BLOOD PRODUCT EXPIRATION DATE: 201811152359
Blood Product Expiration Date: 201811162359
ISSUE DATE / TIME: 201810171131
ISSUE DATE / TIME: 201810171346
UNIT TYPE AND RH: 5100
Unit Type and Rh: 5100

## 2017-07-01 SURGERY — SIGMOIDOSCOPY, FLEXIBLE
Anesthesia: General

## 2017-07-01 MED ORDER — PROPOFOL 10 MG/ML IV BOLUS
INTRAVENOUS | Status: AC
  Filled 2017-07-01: qty 20

## 2017-07-01 MED ORDER — SODIUM CHLORIDE 0.9 % IV SOLN: INTRAVENOUS | Status: DC

## 2017-07-01 MED ORDER — MIDAZOLAM HCL 2 MG/2ML IJ SOLN
INTRAMUSCULAR | Status: AC
  Filled 2017-07-01: qty 2

## 2017-07-01 MED ORDER — PROPOFOL 500 MG/50ML IV EMUL
INTRAVENOUS | Status: DC | PRN
  Administered 2017-07-01: 100 ug/kg/min via INTRAVENOUS

## 2017-07-01 MED ORDER — MIDAZOLAM HCL 2 MG/2ML IJ SOLN
INTRAMUSCULAR | Status: DC | PRN
  Administered 2017-07-01: 2 mg via INTRAVENOUS

## 2017-07-01 MED ORDER — SODIUM CHLORIDE 0.9 % IV SOLN
INTRAVENOUS | Status: DC
  Administered 2017-07-01: 10:00:00 via INTRAVENOUS

## 2017-07-01 MED ORDER — PROPOFOL 10 MG/ML IV BOLUS
INTRAVENOUS | Status: DC | PRN
  Administered 2017-07-01: 70 mg via INTRAVENOUS

## 2017-07-01 MED ORDER — LIDOCAINE HCL (CARDIAC) 20 MG/ML IV SOLN
INTRAVENOUS | Status: DC | PRN
  Administered 2017-07-01: 50 mg via INTRAVENOUS

## 2017-07-01 NOTE — Op Note (Signed)
[facility] Gastroenterology Patient Name: [name] Procedure Date: 07/01/2017 10:54 AM MRN: [id number] Account #: [id number] Date of Birth: 10-28-1950 Admit Type: Outpatient Age: 66 Room: [facility] ENDO ROOM 3 Gender: Female Note Status: Finalized Procedure:            Flexible Sigmoidoscopy Indications:          Hematochezia Providers:            Jonathon Bellows MD, MD Medicines:            Monitored Anesthesia Care Complications:        No immediate complications. Procedure:            Pre-Anesthesia Assessment:                       - Prior to the procedure, a History and Physical was                        performed, and patient medications, allergies and                        sensitivities were reviewed. The patient's tolerance of                        previous anesthesia was reviewed.                       - The risks and benefits of the procedure and the                        sedation options and risks were discussed with the                        patient. All questions were answered and informed                        consent was obtained.                       - ASA Grade Assessment: III - A patient with severe                        systemic disease.                       After obtaining informed consent, the scope was passed                        under direct vision. The Endoscope was introduced                        through the anus and advanced to the the left                        transverse colon. The flexible sigmoidoscopy was                        accomplished with ease. The patient tolerated the                        procedure well. The quality of the bowel preparation  was adequate. Findings:      The perianal exam findings include non-thrombosed internal hemorrhoids       and internal hemorrhoids that prolapse with straining, but spontaneously       regress to the resting position (Grade II).      Non-bleeding  prolapsed external and internal hemorrhoids were found       during retroflexion. The hemorrhoids were moderate, medium-sized and       Grade II (internal hemorrhoids that prolapse but reduce spontaneously).       The hemorroids appeared inflammed Impression:           - Non-thrombosed internal hemorrhoids and internal                        hemorrhoids that prolapse with straining, but                        spontaneously regress to the resting position (Grade                        II) found on perianal exam.                       - No specimens collected.                       - Hemorrhoids. Recommendation:       - I will refer to colorectal surgery for treatment of                        the prolapsing hemorroids Procedure Code(s):    --- Professional ---                       [phone], Sigmoidoscopy, flexible; diagnostic, including                        collection of specimen(s) by brushing or washing, when                        performed (separate procedure) Diagnosis Code(s):    --- Professional ---                       K64.1, Second degree hemorrhoids                       K92.1, Melena (includes Hematochezia) CPT copyright 2016 American Medical Association. All rights reserved. The codes documented in this report are preliminary and upon coder review may  be revised to meet current compliance requirements. Jonathon Bellows, MD Jonathon Bellows MD, MD 07/01/2017 11:23:35 AM This report has been signed electronically. Number of Addenda: 0 Note Initiated On: 07/01/2017 10:54 AM Total Procedure Duration: 0 hours 7 minutes 15 seconds       [facility]

## 2017-07-01 NOTE — Anesthesia Procedure Notes (Signed)
Performed by: Ryland [place]ate Pre-anesthesia Checklist: Patient identified, Emergency Drugs available, Suction available, Patient being monitored and Timeout performed Patient Re-evaluated:Patient Re-evaluated prior to induction Oxygen Delivery Method: Nasal cannula Induction Type: IV induction       

## 2017-07-01 NOTE — H&P (Signed)
[name] MD [address], [address] [place], [place] [address] Phone: [phone] Fax : [phone]  Primary Care Physician:  Margaretann Loveless, PA-C Primary Gastroenterologist:  Dr. [name]   Pre-Procedure History & Physical: HPI:  [name] is a 66 y.o. female is here for an endoscopy and flexible sigmoidoscopy.   Past Medical History:  Diagnosis Date  . Asthma   . GERD (gastroesophageal reflux disease)   . Osteoporosis   . Peptic ulcer     Past Surgical History:  Procedure Laterality Date  . BACK SURGERY    . BREAST SURGERY    . CHOLECYSTECTOMY    . COLONOSCOPY WITH PROPOFOL N/A 06/02/2017   Procedure: COLONOSCOPY WITH PROPOFOL;  Surgeon: [name], MD;  Location: [facility] ENDOSCOPY;  Service: Gastroenterology;  Laterality: N/A;  . KNEE SURGERY    . mini gastric bypass    . SHOULDER SURGERY    . TONSILLECTOMY      Prior to Admission medications   Medication Sig Start Date End Date Taking? Authorizing Provider  b complex vitamins tablet Take 1 tablet by mouth every other day.   Yes [provider]  calcitRIOL (ROCALTROL) 0.5 MCG capsule Take 0.5 mcg by mouth daily. 08/30/16  Yes [provider]  Cholecalciferol (VITAMIN D3) 5000 UNITS CAPS Take 1 capsule by mouth daily.    Yes [provider]  pantoprazole (PROTONIX) 40 MG tablet Take 40 mg by mouth every morning. 08/17/16  Yes [provider]  VITAMIN A PO Take 1 capsule by mouth daily.    Yes [provider]  VITAMIN E PO Take 1 capsule by mouth daily.    Yes [provider]  albuterol (VENTOLIN HFA) 108 (90 Base) MCG/ACT inhaler INHALE 2 PUFFS INTO THE LUNGS EVERY 6 HOURS AS NEEDED 06/07/17   Margaretann Loveless, PA-C  ferrous sulfate 325 (65 FE) MG tablet Take 1 tablet (325 mg total) by mouth daily with breakfast. 06/21/17   [name], MD  fluticasone furoate-vilanterol (BREO ELLIPTA) 100-25 MCG/INH AEPB Inhale 1 puff into the lungs daily. 09/28/16    Margaretann Loveless, PA-C  furosemide (LASIX) 20 MG tablet Take 1 tablet (20 mg total) by mouth daily. 09/28/16   Margaretann Loveless, PA-C  hydrocortisone (ANUSOL-HC) 25 MG suppository Place 1 suppository (25 mg total) rectally 2 (two) times daily. 06/24/17   Margaretann Loveless, PA-C  Misc. Devices (SITZ BATH) MISC 1 kit by Does not apply route 3 (three) times daily. 06/22/17   [name], MD  Multiple Vitamin (MULTIVITAMIN) tablet Take 1 tablet by mouth 2 (two) times daily.     [provider]  potassium chloride (K-DUR) 10 MEQ tablet Take 1 tablet (10 mEq total) by mouth daily. 09/28/16   Margaretann Loveless, PA-C  Teriparatide, Recombinant, 600 MCG/2.4ML SOLN Inject 20 mcg into the skin daily.     [provider]    Allergies as of 06/29/2017 - Review Complete 06/28/2017  Allergen Reaction Noted  . Lac bovis Diarrhea 08/15/2014  . Prednisone Diarrhea 08/15/2014  . Sulfa antibiotics Itching 08/15/2014  . Zithromax [azithromycin] Diarrhea 08/15/2014    Family History  Problem Relation Age of Onset  . Cancer Other   . Hypertension Other   . Stroke Other   . Diabetes Other   . Heart attack Other   . Obesity Other     Social History   Social History  . Marital status: Married    Spouse name: N/A  .  Number of children: N/A  . Years of education: N/A   Occupational History  . Not on file.   Social History Main Topics  . Smoking status: Never Smoker  . Smokeless tobacco: Never Used  . Alcohol use No  . Drug use: No  . Sexual activity: No   Other Topics Concern  . Not on file   Social History Narrative  . No narrative on file    Review of Systems: See HPI, otherwise negative ROS  Physical Exam: BP 126/86   Pulse 65   Temp (!) 96 F (35.6 C) (Oral)   Resp 18   Ht '5\' 4"'$  (1.626 m)   Wt 150 lb (68 kg)   SpO2 99%   BMI 25.75 kg/m  General:   Alert,  pleasant and cooperative in NAD Head:  Normocephalic and atraumatic. Neck:  Supple; no  masses or thyromegaly. Lungs:  Clear throughout to auscultation.    Heart:  Regular rate and rhythm. Abdomen:  Soft, nontender and nondistended. Normal bowel sounds, without guarding, and without rebound.   Neurologic:  Alert and  oriented x4;  grossly normal neurologically.  Impression/Plan: [name] is here for an endoscopy and flexible sigmoidoscopy to be performed for rectal bleeding   Risks, benefits, limitations, and alternatives regarding  endoscopy and flexible sigmoidoscopy have been reviewed with the patient.  Questions have been answered.  All parties agreeable.   Jonathon Bellows, MD  07/01/2017, 10:19 AM

## 2017-07-01 NOTE — Telephone Encounter (Signed)
LVM for patient callback for information.   Contacted [facility].  Patient scheduled for 10/24 @ 9am or 3pm.  [phone] - phone [phone] - fax  Faxed referral to [facility] with labs, procedures, and ov notes.

## 2017-07-01 NOTE — Op Note (Signed)
[facility] Gastroenterology Patient Name: [name] Procedure Date: 07/01/2017 10:54 AM MRN: [id number] Account #: [id number] Date of Birth: [birth year] Admit Type: Outpatient Age: 66 Room: [facility] ENDO ROOM 3 Gender: Female Note Status: Finalized Procedure:            Upper GI endoscopy Indications:          Hematochezia Providers:            Jonathon Bellows MD, MD Medicines:            Monitored Anesthesia Care Complications:        No immediate complications. Procedure:            Pre-Anesthesia Assessment:                       - Prior to the procedure, a History and Physical was                        performed, and patient medications, allergies and                        sensitivities were reviewed. The patient's tolerance of                        previous anesthesia was reviewed.                       - The risks and benefits of the procedure and the                        sedation options and risks were discussed with the                        patient. All questions were answered and informed                        consent was obtained.                       - ASA Grade Assessment: III - A patient with severe                        systemic disease.                       After obtaining informed consent, the endoscope was                        passed under direct vision. Throughout the procedure,                        the patient's blood pressure, pulse, and oxygen                        saturations were monitored continuously. The Endoscope                        was introduced through the mouth, and advanced to the                        third part of duodenum. The upper GI endoscopy was  accomplished with ease. The patient tolerated the                        procedure well. Findings:      The esophagus was normal.      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 This was       characterized by healthy  appearing mucosa.      The examined jejunum was normal.      The cardia and gastric fundus were normal on retroflexion. Impression:           - Normal esophagus.                       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                       - Normal examined jejunum.                       - No specimens collected. Recommendation:       - Perform a flexible sigmoidoscopy for further                        evaluation of the rectum and sigmoid colon today. Procedure Code(s):    --- Professional ---                       [id number], Esophagogastroduodenoscopy, flexible, transoral;                        diagnostic, including collection of specimen(s) by                        brushing or washing, when performed (separate procedure) Diagnosis Code(s):    --- Professional ---                       B93.90, Bariatric surgery status                       K92.1, Melena (includes Hematochezia) CPT copyright 2016 American Medical Association. All rights reserved. The codes documented in this report are preliminary and upon coder review may  be revised to meet current compliance requirements. Jonathon Bellows, MD Jonathon Bellows MD, MD 07/01/2017 11:12:30 AM This report has been signed electronically. Number of Addenda: 0 Note Initiated On: 07/01/2017 10:54 AM      [facility]

## 2017-07-01 NOTE — Transfer of Care (Signed)
Immediate Anesthesia Transfer of Care Note  Patient: [name]  Procedure(s) Performed: FLEXIBLE SIGMOIDOSCOPY (N/A ) ESOPHAGOGASTRODUODENOSCOPY (EGD) WITH PROPOFOL (N/A )  Patient Location: PACU  Anesthesia Type:General  Level of Consciousness: sedated and responds to stimulation  Airway & Oxygen Therapy: Patient Spontanous Breathing and Patient connected to nasal cannula oxygen  Post-op Assessment: Report given to RN and Post -op Vital signs reviewed and stable  Post vital signs: Reviewed and stable  Last Vitals:  Vitals:   07/01/17 1124 07/01/17 1125  BP: 100/68 100/68  Pulse: 75 74  Resp:  17  Temp: 36.5 C   SpO2: 99% 99%    Last Pain:  Vitals:   07/01/17 1124  TempSrc: Tympanic  PainSc:          Complications: No apparent anesthesia complications

## 2017-07-01 NOTE — Anesthesia Preprocedure Evaluation (Signed)
Anesthesia Evaluation  Patient identified by MRN, date of birth, ID band Patient awake    Reviewed: Allergy & Precautions, H&P , NPO status , Patient's Chart, lab work & pertinent test results  History of Anesthesia Complications Negative for: history of anesthetic complications  Airway Mallampati: III  TM Distance: <3 FB Neck ROM: limited    Dental  (+) Poor Dentition, Chipped, Caps, Missing   Pulmonary neg shortness of breath, asthma ,           Cardiovascular Exercise Tolerance: Good (-) angina(-) Past MI and (-) DOE negative cardio ROS       Neuro/Psych negative neurological ROS  negative psych ROS   GI/Hepatic Neg liver ROS, PUD, GERD  Medicated and Controlled,  Endo/Other  negative endocrine ROS  Renal/GU negative Renal ROS  negative genitourinary   Musculoskeletal   Abdominal   Peds  Hematology negative hematology ROS (+)   Anesthesia Other Findings Past Medical History: No date: Asthma No date: GERD (gastroesophageal reflux disease) No date: Osteoporosis No date: Peptic ulcer  Past Surgical History: No date: BACK SURGERY No date: BREAST SURGERY No date: CHOLECYSTECTOMY 06/02/2017: COLONOSCOPY WITH PROPOFOL; N/A     Comment:  Procedure: COLONOSCOPY WITH PROPOFOL;  Surgeon: Jonathon Bellows, MD;  Location: [facility] ENDOSCOPY;  Service:               Gastroenterology;  Laterality: N/A; No date: KNEE SURGERY No date: mini gastric bypass No date: SHOULDER SURGERY No date: TONSILLECTOMY  BMI    Body Mass Index:  25.75 kg/m      Reproductive/Obstetrics negative OB ROS                             Anesthesia Physical Anesthesia Plan  ASA: III  Anesthesia Plan: General   Post-op Pain Management:    Induction: Intravenous  PONV Risk Score and Plan: Propofol infusion  Airway Management Planned: Natural Airway and Nasal Cannula  Additional Equipment:    Intra-op Plan:   Post-operative Plan:   Informed Consent: I have reviewed the patients History and Physical, chart, labs and discussed the procedure including the risks, benefits and alternatives for the proposed anesthesia with the patient or authorized representative who has indicated his/her understanding and acceptance.   Dental Advisory Given  Plan Discussed with: Anesthesiologist, CRNA and Surgeon  Anesthesia Plan Comments: (Patient consented for risks of anesthesia including but not limited to:  - adverse reactions to medications - risk of intubation if required - damage to teeth, lips or other oral mucosa - sore throat or hoarseness - Damage to heart, brain, lungs or loss of life  Patient voiced understanding.)        Anesthesia Quick Evaluation

## 2017-07-01 NOTE — Anesthesia Post-op Follow-up Note (Signed)
Anesthesia QCDR form completed.        

## 2017-07-01 NOTE — Anesthesia Postprocedure Evaluation (Signed)
Anesthesia Post Note  Patient: [name]  Procedure(s) Performed: FLEXIBLE SIGMOIDOSCOPY (N/A ) ESOPHAGOGASTRODUODENOSCOPY (EGD) WITH PROPOFOL (N/A )  Patient location during evaluation: Endoscopy Anesthesia Type: General Level of consciousness: awake and alert Pain management: pain level controlled Vital Signs Assessment: post-procedure vital signs reviewed and stable Respiratory status: spontaneous breathing, nonlabored ventilation, respiratory function stable and patient connected to nasal cannula oxygen Cardiovascular status: blood pressure returned to baseline and stable Postop Assessment: no apparent nausea or vomiting Anesthetic complications: no     Last Vitals:  Vitals:   07/01/17 1124 07/01/17 1125  BP: 100/68 100/68  Pulse: 75 74  Resp:  17  Temp: 36.5 C   SpO2: 99% 99%    Last Pain:  Vitals:   07/01/17 1124  TempSrc: Tympanic  PainSc:                  Precious Haws Piscitello

## 2017-07-06 ENCOUNTER — Encounter: Payer: Self-pay | Admitting: Gastroenterology

## 2017-07-07 ENCOUNTER — Encounter (HOSPITAL_COMMUNITY): Payer: Self-pay

## 2017-07-07 ENCOUNTER — Encounter (HOSPITAL_BASED_OUTPATIENT_CLINIC_OR_DEPARTMENT_OTHER): Payer: 59

## 2017-07-07 ENCOUNTER — Encounter (HOSPITAL_COMMUNITY): Payer: 59

## 2017-07-07 VITALS — BP 97/67 | HR 67 | Temp 98.0°F | Resp 18

## 2017-07-07 DIAGNOSIS — K6289 Other specified diseases of anus and rectum: Secondary | ICD-10-CM

## 2017-07-07 DIAGNOSIS — K922 Gastrointestinal hemorrhage, unspecified: Secondary | ICD-10-CM | POA: Diagnosis not present

## 2017-07-07 DIAGNOSIS — D500 Iron deficiency anemia secondary to blood loss (chronic): Secondary | ICD-10-CM | POA: Diagnosis not present

## 2017-07-07 LAB — CBC WITH DIFFERENTIAL/PLATELET
BASOS ABS: 0 10*3/uL (ref 0.0–0.1)
BASOS PCT: 1 %
Eosinophils Absolute: 0.2 10*3/uL (ref 0.0–0.7)
Eosinophils Relative: 4 %
HEMATOCRIT: 35.8 % — AB (ref 36.0–46.0)
Hemoglobin: 10.8 g/dL — ABNORMAL LOW (ref 12.0–15.0)
Lymphocytes Relative: 22 %
Lymphs Abs: 1.2 10*3/uL (ref 0.7–4.0)
MCH: 26 pg (ref 26.0–34.0)
MCHC: 30.2 g/dL (ref 30.0–36.0)
MCV: 86.1 fL (ref 78.0–100.0)
Monocytes Absolute: 0.4 10*3/uL (ref 0.1–1.0)
Monocytes Relative: 7 %
NEUTROS PCT: 66 %
Neutro Abs: 3.6 10*3/uL (ref 1.7–7.7)
Platelets: 196 10*3/uL (ref 150–400)
RBC: 4.16 MIL/uL (ref 3.87–5.11)
RDW: 19.5 % — ABNORMAL HIGH (ref 11.5–15.5)
WBC: 5.5 10*3/uL (ref 4.0–10.5)

## 2017-07-07 LAB — COMPREHENSIVE METABOLIC PANEL
ALBUMIN: 2.8 g/dL — AB (ref 3.5–5.0)
ALT: 26 U/L (ref 14–54)
AST: 37 U/L (ref 15–41)
Alkaline Phosphatase: 66 U/L (ref 38–126)
Anion gap: 6 (ref 5–15)
BILIRUBIN TOTAL: 0.3 mg/dL (ref 0.3–1.2)
BUN: 12 mg/dL (ref 6–20)
CHLORIDE: 107 mmol/L (ref 101–111)
CO2: 29 mmol/L (ref 22–32)
CREATININE: 0.68 mg/dL (ref 0.44–1.00)
Calcium: 8.3 mg/dL — ABNORMAL LOW (ref 8.9–10.3)
GFR calc Af Amer: >60 mL/min (ref >60)
GLUCOSE: 100 mg/dL — AB (ref 65–99)
POTASSIUM: 4.2 mmol/L (ref 3.5–5.1)
Sodium: 142 mmol/L (ref 135–145)
TOTAL PROTEIN: 5.2 g/dL — AB (ref 6.5–8.1)

## 2017-07-07 LAB — LACTATE DEHYDROGENASE: LDH: 157 U/L (ref 98–192)

## 2017-07-07 MED ORDER — SODIUM CHLORIDE 0.9% FLUSH
10.0000 mL | INTRAVENOUS | Status: DC | PRN
  Administered 2017-07-07: 10 mL
  Filled 2017-07-07: qty 10

## 2017-07-07 MED ORDER — SODIUM CHLORIDE 0.9 % IV SOLN
Freq: Once | INTRAVENOUS | Status: AC
  Administered 2017-07-07: 14:00:00 via INTRAVENOUS

## 2017-07-07 MED ORDER — SODIUM CHLORIDE 0.9 % IV SOLN
510.0000 mg | Freq: Once | INTRAVENOUS | Status: AC
  Administered 2017-07-07: 510 mg via INTRAVENOUS
  Filled 2017-07-07: qty 17

## 2017-07-07 NOTE — Progress Notes (Signed)
Patient tolerated iron infusion with no complaints voiced.  Good blood return noted before and after administration of iron.  Peripheral IV site clean and dry with no swelling noted at site.  Paper tape and cottonball to site.  VSS with discharge and patient left ambulatory with no s/s of distress noted.

## 2017-07-07 NOTE — Patient Instructions (Signed)
[place] at [facility]  Discharge Instructions:  You received an iron infusion today.  Keep next scheduled lab appointments and call for any questions or concerns.  _______________________________________________________________  Thank you for choosing [place] at [facility] to provide your oncology and hematology care.  To afford each patient quality time with our providers, please arrive at least 15 minutes before your scheduled appointment.  You need to re-schedule your appointment if you arrive 10 or more minutes late.  We strive to give you quality time with our providers, and arriving late affects you and other patients whose appointments are after yours.  Also, if you no show three or more times for appointments you may be dismissed from the clinic.  Again, thank you for choosing [place] [facility] [place] hope is that these requests will allow you access to exceptional care and in a timely manner. _______________________________________________________________  If you have questions after your visit, please contact our office at [phone] between the hours of 8:30 a.m. and 5:00 p.m. Voicemails left after 4:30 p.m. will not be returned until the following business day. _______________________________________________________________  For prescription refill requests, have your pharmacy contact our office. _______________________________________________________________  Recommendations made by the consultant and any test results will be sent to your referring physician. _______________________________________________________________

## 2017-07-08 LAB — FERRITIN: FERRITIN: 187 ng/mL (ref 11–307)

## 2017-07-08 LAB — IRON AND TIBC
Iron: 97 ug/dL (ref 28–170)
Saturation Ratios: 31 % (ref 10.4–31.8)
TIBC: 315 ug/dL (ref 250–450)
UIBC: 218 ug/dL

## 2017-07-08 LAB — INTRINSIC FACTOR ANTIBODIES: Intrinsic Factor: 1.1 AU/mL (ref 0.0–1.1)

## 2017-07-08 LAB — ERYTHROPOIETIN: Erythropoietin: 10.2 m[IU]/mL (ref 2.6–18.5)

## 2017-07-09 ENCOUNTER — Encounter: Admission: RE | Payer: Self-pay | Source: Ambulatory Visit

## 2017-07-09 ENCOUNTER — Ambulatory Visit: Admission: RE | Admit: 2017-07-09 | Payer: 59 | Source: Ambulatory Visit | Admitting: Gastroenterology

## 2017-07-09 LAB — ANTI-PARIETAL ANTIBODY: PARIETAL CELL ANTIBODY-IGG: 27.3 U — AB (ref 0.0–20.0)

## 2017-07-09 SURGERY — ESOPHAGOGASTRODUODENOSCOPY (EGD) WITH PROPOFOL
Anesthesia: General

## 2017-07-13 ENCOUNTER — Other Ambulatory Visit (HOSPITAL_COMMUNITY): Payer: Self-pay | Admitting: *Deleted

## 2017-07-13 DIAGNOSIS — D500 Iron deficiency anemia secondary to blood loss (chronic): Secondary | ICD-10-CM

## 2017-07-14 ENCOUNTER — Other Ambulatory Visit (HOSPITAL_COMMUNITY): Payer: Self-pay | Admitting: *Deleted

## 2017-07-14 ENCOUNTER — Encounter (HOSPITAL_COMMUNITY): Payer: 59

## 2017-07-14 ENCOUNTER — Other Ambulatory Visit: Payer: Self-pay

## 2017-07-14 ENCOUNTER — Telehealth: Payer: Self-pay

## 2017-07-14 ENCOUNTER — Telehealth: Payer: Self-pay | Admitting: Gastroenterology

## 2017-07-14 DIAGNOSIS — D500 Iron deficiency anemia secondary to blood loss (chronic): Secondary | ICD-10-CM | POA: Diagnosis not present

## 2017-07-14 DIAGNOSIS — K648 Other hemorrhoids: Secondary | ICD-10-CM

## 2017-07-14 LAB — CBC WITH DIFFERENTIAL/PLATELET
BASOS PCT: 1 %
Basophils Absolute: 0.1 10*3/uL (ref 0.0–0.1)
EOS PCT: 2 %
Eosinophils Absolute: 0.1 10*3/uL (ref 0.0–0.7)
HEMATOCRIT: 38 % (ref 36.0–46.0)
HEMOGLOBIN: 11.9 g/dL — AB (ref 12.0–15.0)
LYMPHS ABS: 1.2 10*3/uL (ref 0.7–4.0)
Lymphocytes Relative: 17 %
MCH: 26.9 pg (ref 26.0–34.0)
MCHC: 31.3 g/dL (ref 30.0–36.0)
MCV: 85.8 fL (ref 78.0–100.0)
MONOS PCT: 7 %
Monocytes Absolute: 0.5 10*3/uL (ref 0.1–1.0)
Neutro Abs: 5.4 10*3/uL (ref 1.7–7.7)
Neutrophils Relative %: 73 %
Platelets: 175 10*3/uL (ref 150–400)
RBC: 4.43 MIL/uL (ref 3.87–5.11)
RDW: 21.4 % — AB (ref 11.5–15.5)
WBC: 7.3 10*3/uL (ref 4.0–10.5)

## 2017-07-14 LAB — IRON AND TIBC
IRON: 128 ug/dL (ref 28–170)
Saturation Ratios: 44 % — ABNORMAL HIGH (ref 10.4–31.8)
TIBC: 290 ug/dL (ref 250–450)
UIBC: 162 ug/dL

## 2017-07-14 LAB — FOLATE: FOLATE: 22 ng/mL (ref >5.9)

## 2017-07-14 LAB — COMPREHENSIVE METABOLIC PANEL
ALK PHOS: 76 U/L (ref 38–126)
ALT: 49 U/L (ref 14–54)
AST: 74 U/L — AB (ref 15–41)
Albumin: 3 g/dL — ABNORMAL LOW (ref 3.5–5.0)
Anion gap: 8 (ref 5–15)
BILIRUBIN TOTAL: 0.4 mg/dL (ref 0.3–1.2)
BUN: 16 mg/dL (ref 6–20)
CALCIUM: 8.7 mg/dL — AB (ref 8.9–10.3)
CO2: 26 mmol/L (ref 22–32)
CREATININE: 0.67 mg/dL (ref 0.44–1.00)
Chloride: 104 mmol/L (ref 101–111)
GFR calc Af Amer: >60 mL/min (ref >60)
Glucose, Bld: 82 mg/dL (ref 65–99)
POTASSIUM: 4.4 mmol/L (ref 3.5–5.1)
Sodium: 138 mmol/L (ref 135–145)
TOTAL PROTEIN: 5.5 g/dL — AB (ref 6.5–8.1)

## 2017-07-14 LAB — VITAMIN B12: Vitamin B-12: 1362 pg/mL — ABNORMAL HIGH (ref 180–914)

## 2017-07-14 LAB — LACTATE DEHYDROGENASE: LDH: 178 U/L (ref 98–192)

## 2017-07-14 LAB — FERRITIN: Ferritin: 426 ng/mL — ABNORMAL HIGH (ref 11–307)

## 2017-07-14 NOTE — Telephone Encounter (Signed)
Contacting [facility] for appointment

## 2017-07-14 NOTE — Telephone Encounter (Signed)
Patient scheduled for banding, she will arrive a little early in order to complete any new patient paperwork.

## 2017-07-14 NOTE — Telephone Encounter (Signed)
Please advise 

## 2017-07-14 NOTE — Telephone Encounter (Signed)
Calling to speak to patient about:  1. her experience at [facility]. 2. Answer questions about hemorrhoid banding 3. Give her alternative locations for the banding, if still interested.

## 2017-07-14 NOTE — Telephone Encounter (Signed)
[facility] calling to sch pt for banding or consult for banding due to that office unable to offer service. Please advise on scheduling. Dr.Armbruster next DOD that does bandings.

## 2017-07-14 NOTE — Telephone Encounter (Signed)
Almyra Free I will see her and can take care of banding her hemorrhoids. Can you please book her for an office visit so I can take a history as a new patient, but can perform her banding during that visit. You can place her in a banding spot if needed. First available. Thanks

## 2017-07-15 LAB — CERULOPLASMIN: Ceruloplasmin: 22.7 mg/dL (ref 19.0–39.0)

## 2017-07-15 LAB — ERYTHROPOIETIN: Erythropoietin: 9.1 m[IU]/mL (ref 2.6–18.5)

## 2017-07-21 ENCOUNTER — Encounter (HOSPITAL_COMMUNITY): Payer: 59 | Attending: Oncology

## 2017-07-21 DIAGNOSIS — D500 Iron deficiency anemia secondary to blood loss (chronic): Secondary | ICD-10-CM | POA: Insufficient documentation

## 2017-07-21 LAB — CBC WITH DIFFERENTIAL/PLATELET
Basophils Absolute: 0 10*3/uL (ref 0.0–0.1)
Basophils Relative: 0 %
EOS ABS: 0.2 10*3/uL (ref 0.0–0.7)
Eosinophils Relative: 3 %
HEMATOCRIT: 35.6 % — AB (ref 36.0–46.0)
HEMOGLOBIN: 11 g/dL — AB (ref 12.0–15.0)
LYMPHS ABS: 1 10*3/uL (ref 0.7–4.0)
LYMPHS PCT: 16 %
MCH: 27.3 pg (ref 26.0–34.0)
MCHC: 30.9 g/dL (ref 30.0–36.0)
MCV: 88.3 fL (ref 78.0–100.0)
MONOS PCT: 6 %
Monocytes Absolute: 0.4 10*3/uL (ref 0.1–1.0)
NEUTROS PCT: 76 %
Neutro Abs: 4.8 10*3/uL (ref 1.7–7.7)
PLATELETS: 247 10*3/uL (ref 150–400)
RBC: 4.03 MIL/uL (ref 3.87–5.11)
RDW: 22.7 % — ABNORMAL HIGH (ref 11.5–15.5)
WBC: 6.4 10*3/uL (ref 4.0–10.5)

## 2017-07-21 LAB — COMPREHENSIVE METABOLIC PANEL
ALK PHOS: 68 U/L (ref 38–126)
ALT: 36 U/L (ref 14–54)
AST: 40 U/L (ref 15–41)
Albumin: 2.8 g/dL — ABNORMAL LOW (ref 3.5–5.0)
Anion gap: 8 (ref 5–15)
BILIRUBIN TOTAL: 0.4 mg/dL (ref 0.3–1.2)
BUN: 17 mg/dL (ref 6–20)
CALCIUM: 8.1 mg/dL — AB (ref 8.9–10.3)
CHLORIDE: 107 mmol/L (ref 101–111)
CO2: 24 mmol/L (ref 22–32)
CREATININE: 0.54 mg/dL (ref 0.44–1.00)
Glucose, Bld: 94 mg/dL (ref 65–99)
Potassium: 3.8 mmol/L (ref 3.5–5.1)
Sodium: 139 mmol/L (ref 135–145)
TOTAL PROTEIN: 5.1 g/dL — AB (ref 6.5–8.1)

## 2017-07-28 ENCOUNTER — Encounter (HOSPITAL_COMMUNITY): Payer: 59

## 2017-07-28 ENCOUNTER — Encounter (HOSPITAL_BASED_OUTPATIENT_CLINIC_OR_DEPARTMENT_OTHER): Payer: 59 | Admitting: Oncology

## 2017-07-28 ENCOUNTER — Other Ambulatory Visit: Payer: Self-pay

## 2017-07-28 ENCOUNTER — Encounter (HOSPITAL_COMMUNITY): Payer: Self-pay

## 2017-07-28 VITALS — BP 131/72 | HR 65 | Temp 98.0°F | Resp 16 | Wt 153.2 lb

## 2017-07-28 DIAGNOSIS — K6289 Other specified diseases of anus and rectum: Secondary | ICD-10-CM

## 2017-07-28 DIAGNOSIS — Z9884 Bariatric surgery status: Secondary | ICD-10-CM | POA: Diagnosis not present

## 2017-07-28 DIAGNOSIS — K649 Unspecified hemorrhoids: Secondary | ICD-10-CM | POA: Diagnosis not present

## 2017-07-28 DIAGNOSIS — I471 Supraventricular tachycardia: Secondary | ICD-10-CM

## 2017-07-28 DIAGNOSIS — D500 Iron deficiency anemia secondary to blood loss (chronic): Secondary | ICD-10-CM | POA: Diagnosis not present

## 2017-07-28 DIAGNOSIS — K922 Gastrointestinal hemorrhage, unspecified: Secondary | ICD-10-CM

## 2017-07-28 LAB — COMPREHENSIVE METABOLIC PANEL
ALT: 28 U/L (ref 14–54)
ANION GAP: 5 (ref 5–15)
AST: 29 U/L (ref 15–41)
Albumin: 3 g/dL — ABNORMAL LOW (ref 3.5–5.0)
Alkaline Phosphatase: 71 U/L (ref 38–126)
BUN: 17 mg/dL (ref 6–20)
CHLORIDE: 106 mmol/L (ref 101–111)
CO2: 27 mmol/L (ref 22–32)
Calcium: 8.2 mg/dL — ABNORMAL LOW (ref 8.9–10.3)
Creatinine, Ser: 0.61 mg/dL (ref 0.44–1.00)
Glucose, Bld: 97 mg/dL (ref 65–99)
Potassium: 3.9 mmol/L (ref 3.5–5.1)
SODIUM: 138 mmol/L (ref 135–145)
Total Bilirubin: 0.4 mg/dL (ref 0.3–1.2)
Total Protein: 5.4 g/dL — ABNORMAL LOW (ref 6.5–8.1)

## 2017-07-28 LAB — CBC WITH DIFFERENTIAL/PLATELET
BASOS PCT: 0 %
Basophils Absolute: 0 10*3/uL (ref 0.0–0.1)
EOS ABS: 0.2 10*3/uL (ref 0.0–0.7)
EOS PCT: 3 %
HCT: 36.5 % (ref 36.0–46.0)
Hemoglobin: 11.5 g/dL — ABNORMAL LOW (ref 12.0–15.0)
Lymphocytes Relative: 21 %
Lymphs Abs: 1.1 10*3/uL (ref 0.7–4.0)
MCH: 27.9 pg (ref 26.0–34.0)
MCHC: 31.5 g/dL (ref 30.0–36.0)
MCV: 88.6 fL (ref 78.0–100.0)
MONO ABS: 0.4 10*3/uL (ref 0.1–1.0)
MONOS PCT: 7 %
NEUTROS PCT: 69 %
Neutro Abs: 3.7 10*3/uL (ref 1.7–7.7)
PLATELETS: 175 10*3/uL (ref 150–400)
RBC: 4.12 MIL/uL (ref 3.87–5.11)
RDW: 23.1 % — AB (ref 11.5–15.5)
WBC: 5.4 10*3/uL (ref 4.0–10.5)

## 2017-07-28 NOTE — Progress Notes (Signed)
[facility] Initial Visit:  Patient Care Team: Mar Daring, PA-C as PCP - General (Family Medicine)  CHIEF COMPLAINTS/PURPOSE OF CONSULTATION:  Symptomatic anemia Iron deficiency anemia due to chronic blood loss Lower GI bleed with BRBPR  HISTORY OF PRESENTING ILLNESS: [name] 66 y.o. female presents today with her husband for evaluation of iron deficiency anemia. Patient has been having ongoing bright red blood per rectum since approximately 05/28/2017. She states that she's has a history of gastric bypass in 2003 and since that she's had chronically 6-8 bowel movements a day. Now every time she has a bowel movement she loses fresh blood as well. The blood is not mixed in with the stool. Patient continues to have ongoing bleeding at this time.   She was admitted into the hospital on 05/31/2017 for this issue. At that time her hemoglobin was 9.8 g/dL, hematocrit 30.5%, MCV 85.9.   CT abdomen pelvis with contrast on 05/31/2017 demonstrated a 2.8 x 3.2 heterogeneously enhancing lobulated mass in the rectum.   Colonoscopy 06/02/17 by Dr.Kiran Vicente Males demonstrated that the the perianal and digital rectal examinations were normal. A few small-mouthed diverticula were found in the sigmoid colon. Non-bleeding internal hemorrhoids were found during retroflexion. The hemorrhoids were small and Grade I (internal hemorrhoids that do not prolapse). The exam was otherwise without abnormality on direct and retroflexion views.   Iron studies from 06/17/2017 demonstrated iron 11, TIBC 405, ferritin 3. Folate was 19.4, vitamin B12 1183. Most recent CBC from 06/24/2017 demonstrated a hemoglobin 7.1 g/dL, hematocrit 23.5%, MCV 80.8.  Patient states that she feels very fatigued. She easily gets short of breath with any exertion. She also has been having palpitations and dizziness. She has chronic insomnia. She bruises easily. She denies any pain. Otherwise review of systems was  negative.  INTERVAL HISTORY: Patient presents today for continued follow-up.  She states she felt so much better after she received 2 doses of Feraheme as well as the 2 units of PRBC transfusion.  Her hemoglobin is up to 11.5 g/dL today up from 7.1 g/dL.  States that she feels like her energy level is much improved.  She states that she has seen GI and they are planning to do banding on 1 of her hemorrhoids, which may be the cause of her rectal bleeding.  She has not had any rectal bleeding recently.  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  She has chronic swelling in her legs due to lymphedema.  No new complaints otherwise.  Review of Systems - Oncology ROS as per HPI otherwise 12 point ROS is negative.  MEDICAL HISTORY: Past Medical History:  Diagnosis Date  . Asthma   . GERD (gastroesophageal reflux disease)   . Osteoporosis   . Peptic ulcer     SURGICAL HISTORY: Past Surgical History:  Procedure Laterality Date  . BACK SURGERY    . BREAST SURGERY    . CHOLECYSTECTOMY    . KNEE SURGERY    . mini gastric bypass    . SHOULDER SURGERY    . TONSILLECTOMY      SOCIAL HISTORY: Social History   Socioeconomic History  . Marital status: Married    Spouse name: Not on file  . Number of children: Not on file  . Years of education: Not on file  . Highest education level: Not on file  Social  Needs  . Financial resource strain: Not on file  . Food insecurity - worry: Not on file  . Food insecurity - inability: Not on file  . Transportation needs - medical: Not on file  . Transportation needs - non-medical: Not on file  Occupational History  . Not on file  Tobacco Use  . Smoking status: Never Smoker  . Smokeless tobacco: Never Used  Substance and Sexual Activity  . Alcohol use: No    Alcohol/week: 0.0 oz  . Drug use: No  . Sexual activity: No  Other Topics  Concern  . Not on file  Social History Narrative  . Not on file    FAMILY HISTORY Family History  Problem Relation Age of Onset  . Cancer Other   . Hypertension Other   . Stroke Other   . Diabetes Other   . Heart attack Other   . Obesity Other     ALLERGIES:  is allergic to lac bovis; prednisone; sulfa antibiotics; and zithromax [azithromycin].  MEDICATIONS:  Current Outpatient Medications  Medication Sig Dispense Refill  . b complex vitamins tablet Take 1 tablet by mouth every other day.    . calcitRIOL (ROCALTROL) 0.5 MCG capsule Take 0.5 mcg by mouth daily.  5  . Cholecalciferol (VITAMIN D3) 5000 UNITS CAPS Take 1 capsule by mouth daily.     . fluticasone furoate-vilanterol (BREO ELLIPTA) 100-25 MCG/INH AEPB Inhale 1 puff into the lungs daily. 60 each 5  . Multiple Vitamin (MULTIVITAMIN) tablet Take 1 tablet by mouth 2 (two) times daily.     . pantoprazole (PROTONIX) 40 MG tablet Take 40 mg by mouth every morning.  3  . Teriparatide, Recombinant, 600 MCG/2.4ML SOLN Inject 20 mcg into the skin daily.     Marland Kitchen VITAMIN A PO Take 1 capsule by mouth daily.     Marland Kitchen VITAMIN E PO Take 1 capsule by mouth daily.      No current facility-administered medications for this visit.     PHYSICAL EXAMINATION:   Vitals:   07/28/17 1150  BP: 131/72  Pulse: 65  Resp: 16  Temp: 98 F (36.7 C)  SpO2: 100%    Filed Weights   07/28/17 1150  Weight: 153 lb 3.2 oz (69.5 kg)     Physical Exam Constitutional: Well-developed, well-nourished, and in no distress.   HENT:  Head: Normocephalic and atraumatic.  Mouth/Throat: No oropharyngeal exudate. Mucosa moist. Eyes: Pupils are equal, round, and reactive to light. Conjunctivae are normal. No scleral icterus.  Neck: Normal range of motion. Neck supple. No JVD present.  Cardiovascular: Normal rate, regular rhythm and normal heart sounds.  Exam reveals no gallop and no friction rub.   No murmur heard. Pulmonary/Chest: Effort normal and  breath sounds normal. No respiratory distress. No wheezes.No rales.  Abdominal: Soft. Bowel sounds are normal. No distension or tenderness to palpation. There is no guarding.  Musculoskeletal: Chronic lymph edema bandaged in bilateral LE, no tenderness.  Lymphadenopathy:    No cervical or supraclavicular adenopathy.  Neurological: Alert and oriented to person, place, and time. No cranial nerve deficit.  Skin: Skin is warm and dry. No rash noted. No erythema. No pallor.  Psychiatric: Affect and judgment normal.    LABORATORY DATA: I have personally reviewed the data as listed:  Appointment on 07/28/2017  Component Date Value Ref Range Status  . WBC 07/28/2017 5.4  4.0 - 10.5 K/uL Final  . RBC 07/28/2017 4.12  3.87 - 5.11 MIL/uL Final  . Hemoglobin  07/28/2017 11.5* 12.0 - 15.0 g/dL Final  . HCT 07/28/2017 36.5  36.0 - 46.0 % Final  . MCV 07/28/2017 88.6  78.0 - 100.0 fL Final  . MCH 07/28/2017 27.9  26.0 - 34.0 pg Final  . MCHC 07/28/2017 31.5  30.0 - 36.0 g/dL Final  . RDW 07/28/2017 23.1* 11.5 - 15.5 % Final  . Platelets 07/28/2017 175  150 - 400 K/uL Final  . Neutrophils Relative % 07/28/2017 69  % Final  . Neutro Abs 07/28/2017 3.7  1.7 - 7.7 K/uL Final  . Lymphocytes Relative 07/28/2017 21  % Final  . Lymphs Abs 07/28/2017 1.1  0.7 - 4.0 K/uL Final  . Monocytes Relative 07/28/2017 7  % Final  . Monocytes Absolute 07/28/2017 0.4  0.1 - 1.0 K/uL Final  . Eosinophils Relative 07/28/2017 3  % Final  . Eosinophils Absolute 07/28/2017 0.2  0.0 - 0.7 K/uL Final  . Basophils Relative 07/28/2017 0  % Final  . Basophils Absolute 07/28/2017 0.0  0.0 - 0.1 K/uL Final  . Smear Review 07/28/2017 ANISOCYTES   Final  . Sodium 07/28/2017 138  135 - 145 mmol/L Final  . Potassium 07/28/2017 3.9  3.5 - 5.1 mmol/L Final  . Chloride 07/28/2017 106  101 - 111 mmol/L Final  . CO2 07/28/2017 27  22 - 32 mmol/L Final  . Glucose, Bld 07/28/2017 97  65 - 99 mg/dL Final  . BUN 07/28/2017 17  6 - 20  mg/dL Final  . Creatinine, Ser 07/28/2017 0.61  0.44 - 1.00 mg/dL Final  . Calcium 07/28/2017 8.2* 8.9 - 10.3 mg/dL Final  . Total Protein 07/28/2017 5.4* 6.5 - 8.1 g/dL Final  . Albumin 07/28/2017 3.0* 3.5 - 5.0 g/dL Final  . AST 07/28/2017 29  15 - 41 U/L Final  . ALT 07/28/2017 28  14 - 54 U/L Final  . Alkaline Phosphatase 07/28/2017 71  38 - 126 U/L Final  . Total Bilirubin 07/28/2017 0.4  0.3 - 1.2 mg/dL Final  . GFR calc non Af Amer 07/28/2017 >60  >60 mL/min Final  . GFR calc Af Amer 07/28/2017 >60  >60 mL/min Final   Comment: (NOTE) The eGFR has been calculated using the CKD EPI equation. This calculation has not been validated in all clinical situations. eGFR's persistently <60 mL/min signify possible Chronic Kidney Disease.   . Anion gap 07/28/2017 5  5 - 15 Final  Appointment on 07/21/2017  Component Date Value Ref Range Status  . WBC 07/21/2017 6.4  4.0 - 10.5 K/uL Final  . RBC 07/21/2017 4.03  3.87 - 5.11 MIL/uL Final  . Hemoglobin 07/21/2017 11.0* 12.0 - 15.0 g/dL Final  . HCT 07/21/2017 35.6* 36.0 - 46.0 % Final  . MCV 07/21/2017 88.3  78.0 - 100.0 fL Final  . MCH 07/21/2017 27.3  26.0 - 34.0 pg Final  . MCHC 07/21/2017 30.9  30.0 - 36.0 g/dL Final  . RDW 07/21/2017 22.7* 11.5 - 15.5 % Final  . Platelets 07/21/2017 247  150 - 400 K/uL Final  . Neutrophils Relative % 07/21/2017 76  % Final  . Neutro Abs 07/21/2017 4.8  1.7 - 7.7 K/uL Final  . Lymphocytes Relative 07/21/2017 16  % Final  . Lymphs Abs 07/21/2017 1.0  0.7 - 4.0 K/uL Final  . Monocytes Relative 07/21/2017 6  % Final  . Monocytes Absolute 07/21/2017 0.4  0.1 - 1.0 K/uL Final  . Eosinophils Relative 07/21/2017 3  % Final  . Eosinophils Absolute 07/21/2017 0.2  0.0 - 0.7 K/uL Final  . Basophils Relative 07/21/2017 0  % Final  . Basophils Absolute 07/21/2017 0.0  0.0 - 0.1 K/uL Final  . RBC Morphology 07/21/2017 ANISOCYTES   Final  . Sodium 07/21/2017 139  135 - 145 mmol/L Final  . Potassium 07/21/2017  3.8  3.5 - 5.1 mmol/L Final  . Chloride 07/21/2017 107  101 - 111 mmol/L Final  . CO2 07/21/2017 24  22 - 32 mmol/L Final  . Glucose, Bld 07/21/2017 94  65 - 99 mg/dL Final  . BUN 07/21/2017 17  6 - 20 mg/dL Final  . Creatinine, Ser 07/21/2017 0.54  0.44 - 1.00 mg/dL Final  . Calcium 07/21/2017 8.1* 8.9 - 10.3 mg/dL Final  . Total Protein 07/21/2017 5.1* 6.5 - 8.1 g/dL Final  . Albumin 07/21/2017 2.8* 3.5 - 5.0 g/dL Final  . AST 07/21/2017 40  15 - 41 U/L Final  . ALT 07/21/2017 36  14 - 54 U/L Final  . Alkaline Phosphatase 07/21/2017 68  38 - 126 U/L Final  . Total Bilirubin 07/21/2017 0.4  0.3 - 1.2 mg/dL Final  . GFR calc non Af Amer 07/21/2017 >60  >60 mL/min Final  . GFR calc Af Amer 07/21/2017 >60  >60 mL/min Final   Comment: (NOTE) The eGFR has been calculated using the CKD EPI equation. This calculation has not been validated in all clinical situations. eGFR's persistently <60 mL/min signify possible Chronic Kidney Disease.   . Anion gap 07/21/2017 8  5 - 15 Final  Appointment on 07/14/2017  Component Date Value Ref Range Status  . WBC 07/14/2017 7.3  4.0 - 10.5 K/uL Final  . RBC 07/14/2017 4.43  3.87 - 5.11 MIL/uL Final  . Hemoglobin 07/14/2017 11.9* 12.0 - 15.0 g/dL Final  . HCT 07/14/2017 38.0  36.0 - 46.0 % Final  . MCV 07/14/2017 85.8  78.0 - 100.0 fL Final  . MCH 07/14/2017 26.9  26.0 - 34.0 pg Final  . MCHC 07/14/2017 31.3  30.0 - 36.0 g/dL Final  . RDW 07/14/2017 21.4* 11.5 - 15.5 % Final  . Platelets 07/14/2017 175  150 - 400 K/uL Final  . Neutrophils Relative % 07/14/2017 73  % Final  . Lymphocytes Relative 07/14/2017 17  % Final  . Monocytes Relative 07/14/2017 7  % Final  . Eosinophils Relative 07/14/2017 2  % Final  . Basophils Relative 07/14/2017 1  % Final  . Neutro Abs 07/14/2017 5.4  1.7 - 7.7 K/uL Final  . Lymphs Abs 07/14/2017 1.2  0.7 - 4.0 K/uL Final  . Monocytes Absolute 07/14/2017 0.5  0.1 - 1.0 K/uL Final  . Eosinophils Absolute 07/14/2017 0.1   0.0 - 0.7 K/uL Final  . Basophils Absolute 07/14/2017 0.1  0.0 - 0.1 K/uL Final  . RBC Morphology 07/14/2017 ANISOCYTES   Final  . Sodium 07/14/2017 138  135 - 145 mmol/L Final  . Potassium 07/14/2017 4.4  3.5 - 5.1 mmol/L Final  . Chloride 07/14/2017 104  101 - 111 mmol/L Final  . CO2 07/14/2017 26  22 - 32 mmol/L Final  . Glucose, Bld 07/14/2017 82  65 - 99 mg/dL Final  . BUN 07/14/2017 16  6 - 20 mg/dL Final  . Creatinine, Ser 07/14/2017 0.67  0.44 - 1.00 mg/dL Final  . Calcium 07/14/2017 8.7* 8.9 - 10.3 mg/dL Final  . Total Protein 07/14/2017 5.5* 6.5 - 8.1 g/dL Final  . Albumin 07/14/2017 3.0* 3.5 - 5.0 g/dL Final  . AST 07/14/2017  74* 15 - 41 U/L Final  . ALT 07/14/2017 49  14 - 54 U/L Final  . Alkaline Phosphatase 07/14/2017 76  38 - 126 U/L Final  . Total Bilirubin 07/14/2017 0.4  0.3 - 1.2 mg/dL Final  . GFR calc non Af Amer 07/14/2017 >60  >60 mL/min Final  . GFR calc Af Amer 07/14/2017 >60  >60 mL/min Final   Comment: (NOTE) The eGFR has been calculated using the CKD EPI equation. This calculation has not been validated in all clinical situations. eGFR's persistently <60 mL/min signify possible Chronic Kidney Disease.   . Anion gap 07/14/2017 8  5 - 15 Final  . Erythropoietin 07/14/2017 9.1  2.6 - 18.5 mIU/mL Final   Comment: (NOTE) [employer] UniCel DxI 800 Immunoassay System Performed At: [facility] [place], [place] [id number] Lindon Romp MD TF:[id number]   . Iron 07/14/2017 128  28 - 170 ug/dL Final  . TIBC 07/14/2017 290  250 - 450 ug/dL Final  . Saturation Ratios 07/14/2017 44* 10.4 - 31.8 % Final  . UIBC 07/14/2017 162  ug/dL Final   Performed at [facility], [place] [address], [place], [place] [address]  . Ferritin 07/14/2017 426* 11 - 307 ng/mL Final   Performed at [facility], [place] [address] [place], [place] [address]  . Vitamin B-12 07/14/2017 1,362* 180 - 914 pg/mL Final   Comment: (NOTE) This assay is not  validated for testing neonatal or myeloproliferative syndrome specimens for Vitamin B12 levels. Performed at [facility], [place] [address] [place], [place] [address]   . LDH 07/14/2017 178  98 - 192 U/L Final  . Folate 07/14/2017 22.0  >5.9 ng/mL Final   Performed at [facility], [place] [address], [place], [place] [address]  . Ceruloplasmin 07/14/2017 22.7  19.0 - 39.0 mg/dL Final   Comment: (NOTE) Performed At: [facility] [place], [place] [id number] Lindon Romp MD NI:[id number]   Appointment on 07/07/2017  Component Date Value Ref Range Status  . WBC 07/07/2017 5.5  4.0 - 10.5 K/uL Final  . RBC 07/07/2017 4.16  3.87 - 5.11 MIL/uL Final  . Hemoglobin 07/07/2017 10.8* 12.0 - 15.0 g/dL Final  . HCT 07/07/2017 35.8* 36.0 - 46.0 % Final  . MCV 07/07/2017 86.1  78.0 - 100.0 fL Final  . MCH 07/07/2017 26.0  26.0 - 34.0 pg Final  . MCHC 07/07/2017 30.2  30.0 - 36.0 g/dL Final  . RDW 07/07/2017 19.5* 11.5 - 15.5 % Final  . Platelets 07/07/2017 196  150 - 400 K/uL Final  . Neutrophils Relative % 07/07/2017 66  % Final  . Neutro Abs 07/07/2017 3.6  1.7 - 7.7 K/uL Final  . Lymphocytes Relative 07/07/2017 22  % Final  . Lymphs Abs 07/07/2017 1.2  0.7 - 4.0 K/uL Final  . Monocytes Relative 07/07/2017 7  % Final  . Monocytes Absolute 07/07/2017 0.4  0.1 - 1.0 K/uL Final  . Eosinophils Relative 07/07/2017 4  % Final  . Eosinophils Absolute 07/07/2017 0.2  0.0 - 0.7 K/uL Final  . Basophils Relative 07/07/2017 1  % Final  . Basophils Absolute 07/07/2017 0.0  0.0 - 0.1 K/uL Final  . Sodium 07/07/2017 142  135 - 145 mmol/L Final  . Potassium 07/07/2017 4.2  3.5 - 5.1 mmol/L Final  . Chloride 07/07/2017 107  101 - 111 mmol/L Final  . CO2 07/07/2017 29  22 - 32 mmol/L  Final  . Glucose, Bld 07/07/2017 100* 65 - 99 mg/dL Final  . BUN 07/07/2017 12  6 - 20 mg/dL Final  . Creatinine, Ser 07/07/2017 0.68  0.44 - 1.00 mg/dL Final  . Calcium 07/07/2017 8.3* 8.9  - 10.3 mg/dL Final  . Total Protein 07/07/2017 5.2* 6.5 - 8.1 g/dL Final  . Albumin 07/07/2017 2.8* 3.5 - 5.0 g/dL Final  . AST 07/07/2017 37  15 - 41 U/L Final  . ALT 07/07/2017 26  14 - 54 U/L Final  . Alkaline Phosphatase 07/07/2017 66  38 - 126 U/L Final  . Total Bilirubin 07/07/2017 0.3  0.3 - 1.2 mg/dL Final  . GFR calc non Af Amer 07/07/2017 >60  >60 mL/min Final  . GFR calc Af Amer 07/07/2017 >60  >60 mL/min Final   Comment: (NOTE) The eGFR has been calculated using the CKD EPI equation. This calculation has not been validated in all clinical situations. eGFR's persistently <60 mL/min signify possible Chronic Kidney Disease.   . Anion gap 07/07/2017 6  5 - 15 Final  . Iron 07/07/2017 97  28 - 170 ug/dL Final  . TIBC 07/07/2017 315  250 - 450 ug/dL Final  . Saturation Ratios 07/07/2017 31  10.4 - 31.8 % Final  . UIBC 07/07/2017 218  ug/dL Final   Performed at [facility], [place] [address] [place], [place] [address]  . Ferritin 07/07/2017 187  11 - 307 ng/mL Final   Performed at [facility], [place] [address] [place], [place] [address]  . Erythropoietin 07/07/2017 10.2  2.6 - 18.5 mIU/mL Final   Comment: (NOTE) [employer] UniCel DxI 800 Immunoassay System Performed At: [facility] [place], [place] [id number] Lindon Romp MD JI:[id number]   . Parietal Cell Antibody-IgG 07/07/2017 27.3* 0.0 - 20.0 Units Final   Comment: (NOTE)                                Negative    0.0 - 20.0                                Equivocal  20.1 - 24.9                                Positive         >24.9 Parietal Cell Antibodies are found in 90% of patients with pernicious anemia and 30% of first degree relatives with pernicious anemia. Performed At: [facility] [place], [place] [id number] Lindon Romp MD DP:[id number]   . Intrinsic Factor 07/07/2017 1.1  0.0 - 1.1 AU/mL Final   Comment: (NOTE) Performed At: [facility] [place], [place] [id number] Lindon Romp MD PY:[id number]   . LDH 07/07/2017 157  98 - 192 U/L Final  Appointment on 06/28/2017  Component Date Value Ref Range Status  . WBC 06/28/2017 5.1  4.0 - 10.5 K/uL Final  . RBC 06/28/2017 3.00* 3.87 - 5.11 MIL/uL Final  . Hemoglobin 06/28/2017 7.3* 12.0 - 15.0 g/dL Final  . HCT 06/28/2017 24.4* 36.0 - 46.0 % Final  . MCV 06/28/2017 81.3  78.0 - 100.0 fL Final  . MCH 06/28/2017 24.3* 26.0 - 34.0 pg Final  . MCHC 06/28/2017 29.9* 30.0 - 36.0  g/dL Final  . RDW 06/28/2017 16.4* 11.5 - 15.5 % Final  . Platelets 06/28/2017 218  150 - 400 K/uL Final  . Neutrophils Relative % 06/28/2017 63  % Final  . Neutro Abs 06/28/2017 3.2  1.7 - 7.7 K/uL Final  . Lymphocytes Relative 06/28/2017 22  % Final  . Lymphs Abs 06/28/2017 1.1  0.7 - 4.0 K/uL Final  . Monocytes Relative 06/28/2017 11  % Final  . Monocytes Absolute 06/28/2017 0.5  0.1 - 1.0 K/uL Final  . Eosinophils Relative 06/28/2017 3  % Final  . Eosinophils Absolute 06/28/2017 0.2  0.0 - 0.7 K/uL Final  . Basophils Relative 06/28/2017 1  % Final  . Basophils Absolute 06/28/2017 0.0  0.0 - 0.1 K/uL Final  . ABO/RH(D) 06/28/2017 O POS   Final  Office Visit on 06/28/2017  Component Date Value Ref Range Status  . ABO/RH(D) 06/28/2017 O POS   Final  . Antibody Screen 06/28/2017 NEG   Final  . Sample Expiration 06/28/2017 07/01/2017   Final  . Unit Number 06/28/2017 [id number]   Final  . Blood Component Type 06/28/2017 RED CELLS,LR   Final  . Unit division 06/28/2017 00   Final  . Status of Unit 06/28/2017 ISSUED,FINAL   Final  . Transfusion Status 06/28/2017 OK TO TRANSFUSE   Final  . Crossmatch Result 06/28/2017 Compatible   Final  . Unit Number 06/28/2017 [id number]   Final  . Blood Component Type 06/28/2017 RED CELLS,LR   Final  . Unit division 06/28/2017 00   Final  . Status of Unit 06/28/2017 ISSUED,FINAL   Final  . Transfusion Status 06/28/2017 OK  TO TRANSFUSE   Final  . Crossmatch Result 06/28/2017 Compatible   Final  . Order Confirmation 06/28/2017 ORDER PROCESSED BY BLOOD BANK   Final  . ISSUE DATE / TIME 06/28/2017 [id number]   Final  . Blood Product Unit Number 06/28/2017 [id number]   Final  . PRODUCT CODE 06/28/2017 [id number]   Final  . Unit Type and Rh 06/28/2017 5100   Final  . Blood Product Expiration Date 06/28/2017 [id number]   Final  . ISSUE DATE / TIME 06/28/2017 [id number]   Final  . Blood Product Unit Number 06/28/2017 [id number]   Final  . PRODUCT CODE 06/28/2017 [id number]   Final  . Unit Type and Rh 06/28/2017 5100   Final  . Blood Product Expiration Date 06/28/2017 [id number]   Final    RADIOGRAPHIC STUDIES: I have personally reviewed the radiological images as listed and agree with the findings in the report  No results found.  ASSESSMENT: Symptomatic anemia with most recent hemoglobin 7.1 g/dL on 06/24/17. Iron deficiency anemia due to chronic blood loss, ferritin 3. Lower GI bleed with BRBPR History of gastric bypass in 2003  PLAN: -Received 2 units pRBC transfusion on 06/28/17 and 2 doses of Feraheme on 06/30/17 and 07/07/17.  Reviewed labs with patient today.  Her hemoglobin is up to 11.5 g/dL.  Her most recent iron studies from 07/14/2017 shows that her ferritin is up to 426 after 2 doses of Feraheme. -Patient's hemoglobin has been steadily increasing over the past 4 weeks. -Follow up with GI at scheduled appointment for banding of her hemorrhoids, which is likely the cause of her rectal bleeding.  -RTC in 3 months for follow up with labs. I have told her to call us to have her labs done earlier if she should start having rectal bleeding again and feel symptomatic. -Given her history of gastric bypass, she  will not be able to absorb oral iron or B12. She will always need B12 injections if she becomes B12 deficient and also IV iron infusions if her iron gets too low.   Orders Placed This  Encounter  Procedures  . CBC with Differential    Standing Status:   Future    Standing Expiration Date:   07/28/2018  . Comprehensive metabolic panel    Standing Status:   Future    Standing Expiration Date:   07/28/2018  . Iron and TIBC    Standing Status:   Future    Standing Expiration Date:   07/28/2018  . Ferritin    Standing Status:   Future    Standing Expiration Date:   07/28/2018  . Vitamin B12    Standing Status:   Future    Standing Expiration Date:   07/28/2018    All questions were answered. The patient knows to call the clinic with any problems, questions or concerns.  This note was electronically signed.    Twana First, MD  07/28/2017 1:25 PM

## 2017-07-28 NOTE — Patient Instructions (Signed)
[facility] Discharge Instructions  RECOMMENDATIONS MADE BY THE CONSULTANT AND ANY TEST RESULTS WILL BE SENT TO YOUR REFERRING PHYSICIAN.  You were seen today by Dr. Louise Zhou    Thank you for choosing [facility] at [facility] to provide your oncology and hematology care.  To afford each patient quality time with our provider, please arrive at least 15 minutes before your scheduled appointment time.    If you have a lab appointment with the Cancer Center please come in thru the  Main Entrance and check in at the main information desk  You need to re-schedule your appointment should you arrive 10 or more minutes late.  We strive to give you quality time with our providers, and arriving late affects you and other patients whose appointments are after yours.  Also, if you no show three or more times for appointments you may be dismissed from the clinic at the providers discretion.     Again, thank you for choosing [facility].  Our hope is that these requests will decrease the amount of time that you wait before being seen by our physicians.       _____________________________________________________________  Should you have questions after your visit to [facility], please contact our office at [phone] between the hours of 8:30 a.m. and 4:30 p.m.  Voicemails left after 4:30 p.m. will not be returned until the following business day.  For prescription refill requests, have your pharmacy contact our office.       Resources For Cancer Patients and their Caregivers ? American Cancer Society: Can assist with transportation, wigs, general needs, runs Look Good Feel Better.        1-888-227-6333 ? Cancer Care: Provides financial assistance, online support groups, medication/co-pay assistance.  1-800-813-HOPE (4673) ? [facility] Assists [place] cancer patients and their  families through emotional , educational and financial support.  336-427-4357 ? [place] DSS Where to apply for food stamps, Medicaid and utility assistance. 336-342-1394 ? [facility]: Transportation to medical appointments. 336-347-2287 ? Social Security Administration: May apply for disability if have a Stage IV cancer. 336-342-7796 1-800-772-1213 ? [place] Aging, Disability and Transit Services: Assists with nutrition, care and transit needs. 336-349-2343  Cancer Center Support Programs: @10RELATIVEDAYS@ > Cancer Support Group  2nd Tuesday of the month 1pm-2pm, Journey Room  > Creative Journey  3rd Tuesday of the month 1130am-1pm, Journey Room  > Look Good Feel Better  1st Wednesday of the month 10am-12 noon, Journey Room (Call American Cancer Society to register 1-800-395-5775)    

## 2017-07-29 ENCOUNTER — Encounter: Payer: Self-pay | Admitting: *Deleted

## 2017-08-10 ENCOUNTER — Ambulatory Visit: Payer: 59 | Admitting: Gastroenterology

## 2017-08-10 ENCOUNTER — Encounter: Payer: Self-pay | Admitting: Gastroenterology

## 2017-08-10 VITALS — BP 122/78 | HR 70 | Ht 61.42 in | Wt 155.0 lb

## 2017-08-10 DIAGNOSIS — K648 Other hemorrhoids: Secondary | ICD-10-CM | POA: Diagnosis not present

## 2017-08-10 NOTE — Patient Instructions (Signed)
If you are age 64 or older, your body mass index should be between 23-30. Your Body mass index is 28.89 kg/m. If this is out of the aforementioned range listed, please consider follow up with your Primary Care Provider.  If you are age 58 or younger, your body mass index should be between 19-25. Your Body mass index is 28.89 kg/m. If this is out of the aformentioned range listed, please consider follow up with your Primary Care Provider.   HEMORRHOID BANDING PROCEDURE    FOLLOW-UP CARE   1. The procedure you have had should have been relatively painless since the banding of the area involved does not have nerve endings and there is no pain sensation.  The rubber band cuts off the blood supply to the hemorrhoid and the band may fall off as soon as 48 hours after the banding (the band may occasionally be seen in the toilet bowl following a bowel movement). You may notice a temporary feeling of fullness in the rectum which should respond adequately to plain Tylenol or Motrin.  2. Following the banding, avoid strenuous exercise that evening and resume full activity the next day.  A sitz bath (soaking in a warm tub) or bidet is soothing, and can be useful for cleansing the area after bowel movements.     3. To avoid constipation, take two tablespoons of natural wheat bran, natural oat bran, flax, Benefiber or any over the counter fiber supplement and increase your water intake to 7-8 glasses daily.    4. Unless you have been prescribed anorectal medication, do not put anything inside your rectum for two weeks: No suppositories, enemas, fingers, etc.  5. Occasionally, you may have more bleeding than usual after the banding procedure.  This is often from the untreated hemorrhoids rather than the treated one.  Don't be concerned if there is a tablespoon or so of blood.  If there is more blood than this, lie flat with your bottom higher than your head and apply an ice pack to the area. If the bleeding  does not stop within a half an hour or if you feel faint, call our office at [phone] or go to the emergency room.  6. Problems are not common; however, if there is a substantial amount of bleeding, severe pain, chills, fever or difficulty passing urine (very rare) or other problems, you should call us at [phone] or report to the nearest emergency room.  7. Do not stay seated continuously for more than 2-3 hours for a day or two after the procedure.  Tighten your buttock muscles 10-15 times every two hours and take 10-15 deep breaths every 1-2 hours.  Do not spend more than a few minutes on the toilet if you cannot empty your bowel; instead re-visit the toilet at a later time.  Thank you for choosing [facility]

## 2017-08-10 NOTE — Progress Notes (Addendum)
HPI :  66 y/o female with history of anemia related to bleeding from internal hemorrhoids, asthma, GERD referred here by Dr. Jonathon Bellows for hemorrhoid therapy.  She states she initially had CT scan in September showing a suspected rectal mass. A follow up colonoscopy showed only diverticulosis and internal hemorrhoids, no mass. She had recurrent bleeding, repeat CT scan showed no mass. Flex sig showed inflamed hemorrhoids, with EGD showing normal gastric bypass anatomy.  She endorses main symptoms of hemorrhoids are bleeding. She has occasional mild discomfort but usually not much all. She states she has been having rectal bleeding for 2-3 years which occurs intermittently. 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 Denies straining or constipation.  CT scan 05/31/17 - 3.2cm rectal mass CT scan 06/28/17 - rectal mass not visualized  Colonoscopy 06/02/2017 - sigmoid diverticulosis, internal hemorrhoids, otherwise normal Flex sig 07/01/2017 - grade II hemorrhoids EGD 07/01/17 - normal gastric bypass anatomy   Past Medical History:  Diagnosis Date  . Asthma   . GERD (gastroesophageal reflux disease)   . Internal hemorrhoids   . Iron deficiency anemia   . Osteoporosis   . Peptic ulcer   . Tubular adenoma of colon      Past Surgical History:  Procedure Laterality Date  . BACK SURGERY    . BREAST SURGERY    . CHOLECYSTECTOMY    . COLONOSCOPY WITH PROPOFOL N/A 06/02/2017   Procedure: COLONOSCOPY WITH PROPOFOL;  Surgeon: Jonathon Bellows, MD;  Location: [facility] ENDOSCOPY;  Service: Gastroenterology;  Laterality: N/A;  . ESOPHAGOGASTRODUODENOSCOPY (EGD) WITH PROPOFOL N/A 07/01/2017   Procedure: ESOPHAGOGASTRODUODENOSCOPY (EGD) WITH PROPOFOL;  Surgeon: Jonathon Bellows, MD;  Location: [facility] ENDOSCOPY;  Service: Gastroenterology;  Laterality: N/A;  . FLEXIBLE SIGMOIDOSCOPY N/A 07/01/2017   Procedure: FLEXIBLE SIGMOIDOSCOPY;  Surgeon: Jonathon Bellows, MD;  Location: [facility] ENDOSCOPY;  Service:  Gastroenterology;  Laterality: N/A;  . KNEE SURGERY    . mini gastric bypass    . SHOULDER SURGERY    . TONSILLECTOMY     Family History  Problem Relation Age of Onset  . Hypertension Other   . Stroke Other   . Diabetes Other   . Heart attack Other   . Obesity Other   . Colon cancer Mother   . Diabetes Father   . Heart disease Father   . Breast cancer Paternal Grandmother   . Stomach cancer Neg Hx   . Pancreatic cancer Neg Hx    Social History   Tobacco Use  . Smoking status: Never Smoker  . Smokeless tobacco: Never Used  Substance Use Topics  . Alcohol use: No    Alcohol/week: 0.0 oz  . Drug use: No   Current Outpatient Medications  Medication Sig Dispense Refill  . calcitRIOL (ROCALTROL) 0.5 MCG capsule Take 0.5 mcg by mouth daily.  5  . Cholecalciferol (VITAMIN D3) 5000 UNITS CAPS Take 1 capsule by mouth daily.     . Multiple Vitamin (MULTIVITAMIN) tablet Take 1 tablet by mouth 2 (two) times daily.     . pantoprazole (PROTONIX) 40 MG tablet Take 40 mg by mouth every morning.  3  . Teriparatide, Recombinant, (FORTEO) 600 MCG/2.4ML SOLN Inject 20 mcg into the skin.    Marland Kitchen VITAMIN A PO Take 1 capsule by mouth daily.     Marland Kitchen VITAMIN E PO Take 1 capsule by mouth daily.      No current facility-administered medications for this visit.    Allergies  Allergen Reactions  . Lac Bovis  Diarrhea  . Prednisone Diarrhea  . Sulfa Antibiotics Itching  . Zithromax [Azithromycin] Diarrhea     Review of Systems: All systems reviewed and negative except where noted in HPI.    No results found.  Physical Exam: BP 122/78   Pulse 70   Ht 5' 1.42" (1.56 m)   Wt 155 lb (70.3 kg)   BMI 28.89 kg/m  Constitutional: Pleasant,well-developed, female in no acute distress. HEENT: Normocephalic and atraumatic. Conjunctivae are normal. No scleral icterus. Neck supple.  Cardiovascular: Normal rate, regular rhythm.  Pulmonary/chest: Effort normal and breath sounds normal. No wheezing,  rales or rhonchi. Abdominal: Soft, nondistended, nontender.  There are no masses palpable. No hepatomegaly. DRE / Anoscopy - external skin tags, benign appearing anal canal polyp, inflamed internal hemorrhoids, worst in RP and LL areas Extremities: lymphedema bilaterally? Lymphadenopathy: No cervical adenopathy noted. Neurological: Alert and oriented to person place and time. Skin: Skin is warm and dry. No rashes noted. Psychiatric: Normal mood and affect. Behavior is normal.   ASSESSMENT AND PLAN: 66 year old female with ongoing rectal bleeding and anemia due to internal hemorrhoids, here for new patient visit specifically for hemorrhoid banding. I discussed medical and interventional options for hemorrhoid treatment to include banding and surgery. I do think she is a good candidate for banding, following a discussion about options she wanted to proceed with banding today. Left lateral hemorrhoid banded as outlined below  PROCEDURE NOTE: The patient presents with symptomatic grade II  hemorrhoids, requesting rubber band ligation of his/her hemorrhoidal disease.  All risks, benefits and alternative forms of therapy were described and informed consent was obtained.  In the Left Lateral Decubitus position anoscopic examination revealed grade II hemorrhoids in the all positions, worse in RP and LL areas.  The anorectum was pre-medicated with 0.125% nitroglycerin The decision was made to band the LL internal hemorrhoid, and the [place] was used to perform band ligation without complication.  Digital anorectal examination was then performed to assure proper positioning of the band, and to adjust the banded tissue as required.  The patient was discharged home without pain or other issues.  Dietary and behavioral recommendations were given and along with follow-up instructions.     The patient will return in 2-4 weeks for  follow-up and possible additional banding as required. No  complications were encountered and the patient tolerated the procedure well.   [place], MD [facility] Pager [phone]  CC: Dr. Jonathon Bellows

## 2017-08-11 ENCOUNTER — Encounter: Payer: Self-pay | Admitting: General Practice

## 2017-08-11 NOTE — Progress Notes (Unsigned)
[place] Psychosocial Distress Screening Clinical Social Work  Clinical Social Work was referred by distress screening protocol.  The patient scored a 5 on the Psychosocial Distress Thermometer which indicates moderate distress. Clinical Social Worker Edwyna Shell LCSW to assess for distress and other psychosocial needs.   ONCBCN DISTRESS SCREENING 06/28/2017  Screening Type Initial Screening  Distress experienced in past week (1-10) 5  Physical Problem type Nausea/vomiting;Sleep/insomnia;Getting around;Bathing/dressing;Skin dry/itchy  Physician notified of physical symptoms Yes   CSW spoke w patient, patient indicated that she is "doing much better" w sleeping, emotional well being; states that she continues to have difficult w balance, dizziness.  Lives w husband who is supportive.  Has mentioned these concerns to MDs, has not been diagnosed w cancer at this time so will not follow up with cancer support resources at this time.  Was seeing hematologist at [facility] for blood disorder. Expressed appreciation for call, no further concerns/needs expressed.    Clinical Social Worker follow up needed: No.  If yes, follow up plan:   Edwyna Shell, LCSW Clinical Social Worker Phone:  [phone]

## 2017-08-12 ENCOUNTER — Telehealth: Payer: Self-pay | Admitting: Gastroenterology

## 2017-08-12 NOTE — Telephone Encounter (Signed)
Called [facility] at [phone], spoke to [place] the pharmacist. They are able to compound medications and are able to make up the nitroglycerin 0.125% ointment. Called patient back, gave her the phone number. She states her husband has appointment in [place] today and will be able to pick this up.

## 2017-08-12 NOTE — Telephone Encounter (Signed)
Perfect thanks so much for your help with this

## 2017-08-12 NOTE — Telephone Encounter (Signed)
I called the patient. She had a banding 2 days ago. She had some rectal discomfort last night, today it seems better, but made her anxious. I suspect she is having spasm from the banding and reassured her. She has not had any bleeding. While she is feeling improved, she is concerned about if it recurs or worsening. I recommend topical nitroglycerin 0.125% ointment, pea size amount applied within the anal canal every 8 hours. She was agreeable to do this. She will call if it does not help or symptoms worsen.  Almyra Free can you call this patient to help clarify where she can get this. She lives about 45 minutes away. Normally this is [facility] [place], but not sure if there is a pharmacy near her who can compound it. If they only have 0.2% nitroglycerin at her regular pharmacy, that would be okay, but can cause headaches.   Thanks

## 2017-08-12 NOTE — Telephone Encounter (Signed)
Patient has been having severe, sharp rectal pain since yesterday afternoon. Describes pain as sharp for 15-20 seconds and then eases up. This has continued since yesterday, unable to sit. She denies any blood in stool, does have multiple stools per day due to previous gastric surgeries.

## 2017-08-20 ENCOUNTER — Ambulatory Visit (INDEPENDENT_AMBULATORY_CARE_PROVIDER_SITE_OTHER): Payer: 59 | Admitting: Physician Assistant

## 2017-08-20 ENCOUNTER — Encounter: Payer: Self-pay | Admitting: Physician Assistant

## 2017-08-20 VITALS — BP 100/70 | HR 72 | Temp 98.2°F | Resp 16 | Ht 61.0 in | Wt 155.6 lb

## 2017-08-20 DIAGNOSIS — J040 Acute laryngitis: Secondary | ICD-10-CM | POA: Diagnosis not present

## 2017-08-20 DIAGNOSIS — R059 Cough, unspecified: Secondary | ICD-10-CM

## 2017-08-20 DIAGNOSIS — R05 Cough: Secondary | ICD-10-CM

## 2017-08-20 DIAGNOSIS — J069 Acute upper respiratory infection, unspecified: Secondary | ICD-10-CM | POA: Diagnosis not present

## 2017-08-20 MED ORDER — BENZONATATE 100 MG PO CAPS: 100.0000 mg | ORAL_CAPSULE | Freq: Two times a day (BID) | ORAL | 0 refills | Status: DC | PRN

## 2017-08-20 MED ORDER — MOXIFLOXACIN HCL 400 MG PO TABS: 400.0000 mg | ORAL_TABLET | Freq: Every day | ORAL | 0 refills | Status: DC

## 2017-08-20 NOTE — Progress Notes (Signed)
Patient: [name] Female    DOB: Nov 14, 1950   66 y.o.   MRN: [id number] Visit Date: 08/20/2017  Today's Provider: Mar Daring, PA-C   Chief Complaint  Patient presents with  . URI   Subjective:    HPI Upper Respiratory Infection: Patient complains of symptoms of a URI, possible sinusitis. Symptoms include congestion, cough, fever and sore throat. Onset of symptoms was 4 days ago, gradually worsening since that time. She also c/o productive cough with  brown colored sputum for the past 2 days .  She is drinking plenty of fluids. Evaluation to date: none. Treatment to date: decongestants.     Allergies  Allergen Reactions  . Lac Bovis Diarrhea  . Prednisone Diarrhea  . Sulfa Antibiotics Itching  . Zithromax [Azithromycin] Diarrhea     Current Outpatient Medications:  .  calcitRIOL (ROCALTROL) 0.5 MCG capsule, Take 0.5 mcg by mouth daily., Disp: , Rfl: 5 .  Cholecalciferol (VITAMIN D3) 5000 UNITS CAPS, Take 1 capsule by mouth daily. , Disp: , Rfl:  .  Multiple Vitamin (MULTIVITAMIN) tablet, Take 1 tablet by mouth 2 (two) times daily. , Disp: , Rfl:  .  pantoprazole (PROTONIX) 40 MG tablet, Take 40 mg by mouth every morning., Disp: , Rfl: 3 .  Teriparatide, Recombinant, (FORTEO) 600 MCG/2.4ML SOLN, Inject 20 mcg into the skin., Disp: , Rfl:  .  VITAMIN A PO, Take 1 capsule by mouth daily. , Disp: , Rfl:  .  VITAMIN E PO, Take 1 capsule by mouth daily. , Disp: , Rfl:   Review of Systems  Constitutional: Negative.   HENT: Positive for congestion, ear pain, postnasal drip, rhinorrhea, sinus pressure, sinus pain, sore throat and voice change.   Respiratory: Positive for cough, shortness of breath and wheezing.   Cardiovascular: Negative.   Gastrointestinal: Negative for abdominal pain, anal bleeding, blood in stool and nausea.  Neurological: Negative for dizziness, light-headedness and headaches.    Social History   Tobacco Use  . Smoking status: Never  Smoker  . Smokeless tobacco: Never Used  Substance Use Topics  . Alcohol use: No    Alcohol/week: 0.0 oz   Objective:   BP 100/70 (BP Location: Left Arm, Patient Position: Sitting, Cuff Size: Large)   Temp 98.2 F (36.8 C) (Oral)   Resp 16   Ht 5\' 1"  (1.549 m)   Wt 155 lb 9.6 oz (70.6 kg)   BMI 29.40 kg/m  Vitals:   08/20/17 1109  BP: 100/70  Resp: 16  Temp: 98.2 F (36.8 C)  TempSrc: Oral  Weight: 155 lb 9.6 oz (70.6 kg)  Height: 5\' 1"  (1.549 m)     Physical Exam  Constitutional: She appears well-developed and well-nourished. No distress.  HENT:  Head: Normocephalic and atraumatic.  Right Ear: Hearing, tympanic membrane, external ear and ear canal normal.  Left Ear: Hearing, tympanic membrane, external ear and ear canal normal.  Nose: Nose normal.  Mouth/Throat: Uvula is midline, oropharynx is clear and moist and mucous membranes are normal. No oropharyngeal exudate.  Eyes: Conjunctivae are normal. Pupils are equal, round, and reactive to light. Right eye exhibits no discharge. Left eye exhibits no discharge. No scleral icterus.  Neck: Normal range of motion. Neck supple. No tracheal deviation present. No thyromegaly present.  Cardiovascular: Normal rate, regular rhythm and normal heart sounds. Exam reveals no gallop and no friction rub.  No murmur heard. Pulmonary/Chest: Effort normal and breath sounds normal. No stridor. No  respiratory distress. She has no wheezes. She has no rales.  Lymphadenopathy:    She has no cervical adenopathy.  Skin: Skin is warm and dry. She is not diaphoretic.  Vitals reviewed.       Assessment & Plan:     1. Upper respiratory tract infection, unspecified type Worsening symptoms that have not responded to OTC medications. Will give Avelox as below. Continue allergy medications. Stay well hydrated and get plenty of rest. Call if no symptom improvement or if symptoms worsen. - moxifloxacin (AVELOX) 400 MG tablet; Take 1 tablet (400 mg  total) by mouth daily at 8 pm.  Dispense: 10 tablet; Refill: 0  2. Laryngitis Discussed voice rest, salt water gargles and hard candies to keep throat moist.  3. Cough Tessalon perles given for cough.  - benzonatate (TESSALON) 100 MG capsule; Take 1 capsule (100 mg total) by mouth 2 (two) times daily as needed for cough.  Dispense: 20 capsule; Refill: 0       Mar Daring, PA-C  [facility]

## 2017-08-20 NOTE — Patient Instructions (Signed)
Upper Respiratory Infection, Adult Most upper respiratory infections (URIs) are caused by a virus. A URI affects the nose, throat, and upper air passages. The most common type of URI is often called "the common cold." Follow these instructions at home:  Take medicines only as told by your doctor.  Gargle warm saltwater or take cough drops to comfort your throat as told by your doctor.  Use a warm mist humidifier or inhale steam from a shower to increase air moisture. This may make it easier to breathe.  Drink enough fluid to keep your pee (urine) clear or pale yellow.  Eat soups and other clear broths.  Have a healthy diet.  Rest as needed.  Go back to work when your fever is gone or your doctor says it is okay. ? You may need to stay home longer to avoid giving your URI to others. ? You can also wear a face mask and wash your hands often to prevent spread of the virus.  Use your inhaler more if you have asthma.  Do not use any tobacco products, including cigarettes, chewing tobacco, or electronic cigarettes. If you need help quitting, ask your doctor. Contact a doctor if:  You are getting worse, not better.  Your symptoms are not helped by medicine.  You have chills.  You are getting more short of breath.  You have brown or red mucus.  You have yellow or brown discharge from your nose.  You have pain in your face, especially when you bend forward.  You have a fever.  You have puffy (swollen) neck glands.  You have pain while swallowing.  You have white areas in the back of your throat. Get help right away if:  You have very bad or constant: ? Headache. ? Ear pain. ? Pain in your forehead, behind your eyes, and over your cheekbones (sinus pain). ? Chest pain.  You have long-lasting (chronic) lung disease and any of the following: ? Wheezing. ? Long-lasting cough. ? Coughing up blood. ? A change in your usual mucus.  You have a stiff neck.  You have  changes in your: ? Vision. ? Hearing. ? Thinking. ? Mood. This information is not intended to replace advice given to you by your health care provider. Make sure you discuss any questions you have with your health care provider. Document Released: 02/17/2008 Document Revised: 05/03/2016 Document Reviewed: 12/06/2013 Elsevier Interactive Patient Education  2018 [employer]  

## 2017-09-08 NOTE — Progress Notes (Signed)
Office Visit Note  Patient: [name]             Date of Birth: Jul 29, 1951           MRN: [id number]             PCP: Mar Daring, PA-C Referring: Mar Daring, PA-C Visit Date: 09/17/2017 Occupation: @GUAROCC @    Subjective:  Other (pain in right hand, back pain )   History of Present Illness: [name] is a 66 y.o. female seen in consultation per request of her PCP. According to patient she's been having pain and discomfort in her right heel and for the last 1 year. She describes pain mostly in her right CMC joint. She states last summer she was seen by Dr. Noemi Chapel who did right [facility] injection for her. She states she did not get any relief from the cortisone shot and the symptoms of progressive gotten worse. She has difficulty holding objects, writing and noticed decreased grip strength.  She also has history of lower back pain for last few years. She had a fall about 2 years ago which resulted 2 vertebral fracture. She had vertebroplasty by Dr. Jola Baptist. She was also diagnosed with degenerative disc disease by Dr. Para March per patient. She recalls having right wrist joint fracture in the past which also causes some discomfort. She was treated with probably a cue for a few years and then was switched to [facility] by Dr. Buddy Duty. She takes calcium and vitamin D.  She also gives history of dizziness and frequent falls. She states she has seen several physicians for it. She is also done physical therapy. She feels that her lower extremities are weak and she has difficulty climbing stairs and getting up from the chair.  Activities of Daily Living:  Patient reports morning stiffness for 1 hour.   Patient reports nocturnal pain.  Difficulty dressing/grooming: Reports Difficulty climbing stairs: Reports lower extremity weakness per patient Difficulty getting out of chair: Reports lower extremity weakness per patient Difficulty using hands for taps, buttons, cutlery, and/or  writing: Reports   Review of Systems  Constitutional: Positive for fatigue. Negative for night sweats, weight gain, weight loss and weakness.  HENT: Negative for mouth sores, trouble swallowing, trouble swallowing, mouth dryness and nose dryness.   Eyes: Negative for pain, redness, visual disturbance and dryness.  Respiratory: Negative for cough, shortness of breath and difficulty breathing.   Cardiovascular: Negative for chest pain, palpitations, hypertension, irregular heartbeat and swelling in legs/feet.  Gastrointestinal: Positive for heartburn. Negative for blood in stool, constipation and diarrhea.  Endocrine: Negative for increased urination.  Genitourinary: Negative for vaginal dryness.  Musculoskeletal: Positive for arthralgias, joint pain and morning stiffness. Negative for joint swelling, myalgias, muscle weakness, muscle tenderness and myalgias.  Skin: Negative for color change, rash, hair loss, skin tightness, ulcers and sensitivity to sunlight.  Allergic/Immunologic: Negative for susceptible to infections.  Neurological: Negative for dizziness, memory loss and night sweats.  Hematological: Negative for swollen glands.  Psychiatric/Behavioral: Positive for sleep disturbance. Negative for depressed mood. The patient is not nervous/anxious.     PMFS History:  Patient Active Problem List   Diagnosis Date Noted  . Lymphedema 09/17/2017  . History of vertebral fracture 09/17/2017  . History of osteoporosis 09/17/2017  . Iron deficiency anemia due to chronic blood loss 06/28/2017  . Lower GI bleed 05/31/2017  . Rectal mass 05/31/2017  . Asthma 05/31/2017  . GERD (gastroesophageal reflux disease) 05/31/2017  . Closed  fracture of upper end of humerus 09/23/2016  . Tendinitis 09/23/2016  . Patulous eustachian tube of right ear 11/07/2015  . 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 Vitamin D deficiency 08/20/2014  . Secondary hyperparathyroidism ([place])  08/20/2014    Past Medical History:  Diagnosis Date  . Asthma   . GERD (gastroesophageal reflux disease)   . Internal hemorrhoids   . Iron deficiency anemia   . Osteoporosis   . Peptic ulcer   . Tubular adenoma of colon     Family History  Problem Relation Age of Onset  . Hypertension Other   . Stroke Other   . Diabetes Other   . Heart attack Other   . Obesity Other   . Diabetes Father   . Heart disease Father   . Colon cancer Father   . Breast cancer Paternal Grandmother   . Healthy Daughter   . Stomach cancer Neg Hx   . Pancreatic cancer Neg Hx    Past Surgical History:  Procedure Laterality Date  . BACK SURGERY    . BREAST SURGERY    . CHOLECYSTECTOMY    . COLONOSCOPY WITH PROPOFOL N/A 06/02/2017   Procedure: COLONOSCOPY WITH PROPOFOL;  Surgeon: Jonathon Bellows, MD;  Location: [facility] ENDOSCOPY;  Service: Gastroenterology;  Laterality: N/A;  . ESOPHAGOGASTRODUODENOSCOPY (EGD) WITH PROPOFOL N/A 07/01/2017   Procedure: ESOPHAGOGASTRODUODENOSCOPY (EGD) WITH PROPOFOL;  Surgeon: Jonathon Bellows, MD;  Location: [facility] ENDOSCOPY;  Service: Gastroenterology;  Laterality: N/A;  . FLEXIBLE SIGMOIDOSCOPY N/A 07/01/2017   Procedure: FLEXIBLE SIGMOIDOSCOPY;  Surgeon: Jonathon Bellows, MD;  Location: [facility] ENDOSCOPY;  Service: Gastroenterology;  Laterality: N/A;  . KNEE SURGERY    . mini gastric bypass    . SHOULDER SURGERY    . TONSILLECTOMY     Social History   Social History Narrative  . Not on file     Objective: Vital Signs: BP 130/68 (BP Location: Left Arm, Patient Position: Sitting, Cuff Size: Normal)   Pulse 75   Resp 16   Ht 5\' 3"  (1.6 m)   Wt 159 lb (72.1 kg)   BMI 28.17 kg/m    Physical Exam  Constitutional: She is oriented to person, place, and time. She appears well-developed and well-nourished.  HENT:  Head: Normocephalic and atraumatic.  Eyes: Conjunctivae and EOM are normal.  Neck: Normal range of motion.  Cardiovascular: Normal rate, regular rhythm, normal heart  sounds and intact distal pulses.  Pulmonary/Chest: Effort normal and breath sounds normal.  Abdominal: Soft. Bowel sounds are normal.  Lymphadenopathy:    She has no cervical adenopathy.  Neurological: She is alert and oriented to person, place, and time.  Skin: Skin is warm and dry. Capillary refill takes less than 2 seconds.  Psychiatric: She has a normal mood and affect. Her behavior is normal.  Nursing note and vitals reviewed.    Musculoskeletal Exam: C-spine some limitation with range of motion. She has thoracic kyphosis. She has limitation of range of motion of her lumbar spine. Shoulder joints elbow joints wrist joints are good range of motion. She is tenderness on palpation over right CMC joint. She has mild DIP PIP thickening in her hands. She also has Dupuytren's contracture in her left hand with incomplete extension of her fingers. Hip joints knee joints are good range of motion. No warmth swelling or effusion was noted.  CDAI Exam: No CDAI exam completed.    Investigation: No additional findings. CBC Latest Ref Rng & Units 07/28/2017 07/21/2017 07/14/2017  WBC 4.0 -  10.5 K/uL 5.4 6.4 7.3  Hemoglobin 12.0 - 15.0 g/dL 11.5(L) 11.0(L) 11.9(L)  Hematocrit 36.0 - 46.0 % 36.5 35.6(L) 38.0  Platelets 150 - 400 K/uL 175 247 175   CMP Latest Ref Rng & Units 07/28/2017 07/21/2017 07/14/2017  Glucose 65 - 99 mg/dL 97 94 82  BUN 6 - 20 mg/dL 17 17 16   Creatinine 0.44 - 1.00 mg/dL 0.61 0.54 0.67  Sodium 135 - 145 mmol/L 138 139 138  Potassium 3.5 - 5.1 mmol/L 3.9 3.8 4.4  Chloride 101 - 111 mmol/L 106 107 104  CO2 22 - 32 mmol/L 27 24 26   Calcium 8.9 - 10.3 mg/dL 8.2(L) 8.1(L) 8.7(L)  Total Protein 6.5 - 8.1 g/dL 5.4(L) 5.1(L) 5.5(L)  Total Bilirubin 0.3 - 1.2 mg/dL 0.4 0.4 0.4  Alkaline Phos 38 - 126 U/L 71 68 76  AST 15 - 41 U/L 29 40 74(H)  ALT 14 - 54 U/L 28 36 49    Imaging: Xr Hand 2 View Left  Result Date: 09/17/2017 PIP and DIP narrowing was noted. CMC narrowing was  noted. No MCP joint narrowing was noted. No intercarpal radiocarpal joint space narrowing was noted. Posttraumatic change was noted in the right radius. Generalized osteopenia was noted. Impression: These findings are consistent with osteoarthritis of the hand.  Xr Hand 2 View Right  Result Date: 09/17/2017 CMC narrowing was noted. PIP and DIP narrowing was noted. No MCP joint narrowing was noted. No erosive changes were noted. No metacarpal carpal, intercarpal radiocarpal joint space narrowing was noted. Generalized osteopenia was noted. Impression: These findings are consistent with osteoarthritis of the hand.   Speciality Comments: No specialty comments available.    Procedures:  No procedures performed Allergies: Lac bovis; Prednisone; Sulfa antibiotics; and Zithromax [azithromycin]   Assessment / Plan:     Visit Diagnoses: Pain in both hands -she gives history of pain and discomfort in her right CMC joint. She is incomplete extension of her left hand due to depressions contracture. I do not see any synovitis on examination. Plan: XR Hand 2 View Right, XR Hand 2 View Left. The x-rays were consistent with osteoarthritis. She also has significant osteopenia on the x-rays. I discussed the option of right [facility] surgery. She is not interested in surgery at this point. I have given her a prescription for right CMC brace. I will also schedule her for ultrasound guided right CMC injection. I've given her a prescription for right CMC brace.  Dupuytren's contracture of left hand: We had discussion regarding evaluation by a hand surgeon. She would like to wait at this time.  Chronic midline low back pain without sciatica - Patient gives history of DDD lumbar spine. She states she was diagnosed on the basis of MRI done by Dr. Noemi Chapel.  Iron deficiency anemia due to chronic blood loss - history of IV iron infusions  History of osteoporosis - on Forteo by Dr. Buddy Duty. ttd with Prolia in the past.  History  of vertebral fracture - After a fall. Status post vertebroplasty 2 years ago per patient.  Vitamin D deficiency - On Vitamin D 5,000 Units daily  Secondary hyperparathyroidism ([place])  Gastroesophageal reflux disease  History of asthma: Patient gives history of prednisone use in the past.  History of gastric bypass - 2003  B12 deficiency - Receives B12 injections  Lymphedema - Bilateral lower extremities    Orders: Orders Placed This Encounter  Procedures  . XR Hand 2 View Right  . XR Hand 2 View Left  Meds ordered this encounter  Medications  . diclofenac sodium (VOLTAREN) 1 % GEL    Sig: Apply 2 g topically 4 (four) times daily.    Dispense:  3 Tube    Refill:  0    Face-to-face time spent with patient was 50 minutes. Greater than 50% of time was spent in counseling and coordination of care.  Follow-Up Instructions: Return for Osteoarthritis, Osteoporosis. Bo Merino, MD  Note - This record has been created using Editor, commissioning.  Chart creation errors have been sought, but may not always  have been located. Such creation errors do not reflect on  the standard of medical care.

## 2017-09-17 ENCOUNTER — Ambulatory Visit: Payer: 59 | Admitting: Gastroenterology

## 2017-09-17 ENCOUNTER — Encounter: Payer: Self-pay | Admitting: Gastroenterology

## 2017-09-17 ENCOUNTER — Ambulatory Visit: Payer: 59 | Admitting: Rheumatology

## 2017-09-17 ENCOUNTER — Ambulatory Visit (INDEPENDENT_AMBULATORY_CARE_PROVIDER_SITE_OTHER): Payer: Self-pay

## 2017-09-17 ENCOUNTER — Encounter: Payer: Self-pay | Admitting: Rheumatology

## 2017-09-17 VITALS — BP 130/68 | HR 75 | Resp 16 | Ht 63.0 in | Wt 159.0 lb

## 2017-09-17 VITALS — BP 102/62 | HR 76 | Ht 63.0 in | Wt 158.0 lb

## 2017-09-17 DIAGNOSIS — M720 Palmar fascial fibromatosis [Dupuytren]: Secondary | ICD-10-CM | POA: Diagnosis not present

## 2017-09-17 DIAGNOSIS — K219 Gastro-esophageal reflux disease without esophagitis: Secondary | ICD-10-CM

## 2017-09-17 DIAGNOSIS — M545 Low back pain: Secondary | ICD-10-CM | POA: Diagnosis not present

## 2017-09-17 DIAGNOSIS — L0292 Furuncle, unspecified: Secondary | ICD-10-CM

## 2017-09-17 DIAGNOSIS — M79642 Pain in left hand: Secondary | ICD-10-CM

## 2017-09-17 DIAGNOSIS — Z8709 Personal history of other diseases of the respiratory system: Secondary | ICD-10-CM

## 2017-09-17 DIAGNOSIS — E538 Deficiency of other specified B group vitamins: Secondary | ICD-10-CM

## 2017-09-17 DIAGNOSIS — Z8739 Personal history of other diseases of the musculoskeletal system and connective tissue: Secondary | ICD-10-CM

## 2017-09-17 DIAGNOSIS — Z8781 Personal history of (healed) traumatic fracture: Secondary | ICD-10-CM | POA: Insufficient documentation

## 2017-09-17 DIAGNOSIS — G8929 Other chronic pain: Secondary | ICD-10-CM

## 2017-09-17 DIAGNOSIS — M79641 Pain in right hand: Secondary | ICD-10-CM

## 2017-09-17 DIAGNOSIS — D500 Iron deficiency anemia secondary to blood loss (chronic): Secondary | ICD-10-CM

## 2017-09-17 DIAGNOSIS — N2581 Secondary hyperparathyroidism of renal origin: Secondary | ICD-10-CM | POA: Diagnosis not present

## 2017-09-17 DIAGNOSIS — E559 Vitamin D deficiency, unspecified: Secondary | ICD-10-CM

## 2017-09-17 DIAGNOSIS — K648 Other hemorrhoids: Secondary | ICD-10-CM

## 2017-09-17 DIAGNOSIS — Z9884 Bariatric surgery status: Secondary | ICD-10-CM | POA: Diagnosis not present

## 2017-09-17 DIAGNOSIS — I890 Lymphedema, not elsewhere classified: Secondary | ICD-10-CM

## 2017-09-17 MED ORDER — METRONIDAZOLE 500 MG PO TABS: 500.0000 mg | ORAL_TABLET | Freq: Three times a day (TID) | ORAL | 0 refills | Status: DC

## 2017-09-17 MED ORDER — CIPROFLOXACIN HCL 500 MG PO TABS: 500.0000 mg | ORAL_TABLET | Freq: Two times a day (BID) | ORAL | 0 refills | Status: DC

## 2017-09-17 MED ORDER — DICLOFENAC SODIUM 1 % TD GEL: 2.0000 g | Freq: Four times a day (QID) | TRANSDERMAL | 0 refills | Status: DC

## 2017-09-17 NOTE — Patient Instructions (Addendum)
HEMORRHOID BANDING PROCEDURE    FOLLOW-UP CARE   1. The procedure you have had should have been relatively painless since the banding of the area involved does not have nerve endings and there is no pain sensation.  The rubber band cuts off the blood supply to the hemorrhoid and the band may fall off as soon as 48 hours after the banding (the band may occasionally be seen in the toilet bowl following a bowel movement). You may notice a temporary feeling of fullness in the rectum which should respond adequately to plain Tylenol or Motrin.  2. Following the banding, avoid strenuous exercise that evening and resume full activity the next day.  A sitz bath (soaking in a warm tub) or bidet is soothing, and can be useful for cleansing the area after bowel movements.     3. To avoid constipation, take two tablespoons of natural wheat bran, natural oat bran, flax, Benefiber or any over the counter fiber supplement and increase your water intake to 7-8 glasses daily.    4. Unless you have been prescribed anorectal medication, do not put anything inside your rectum for two weeks: No suppositories, enemas, fingers, etc.  5. Occasionally, you may have more bleeding than usual after the banding procedure.  This is often from the untreated hemorrhoids rather than the treated one.  Don't be concerned if there is a tablespoon or so of blood.  If there is more blood than this, lie flat with your bottom higher than your head and apply an ice pack to the area. If the bleeding does not stop within a half an hour or if you feel faint, call our office at [phone] or go to the emergency room.  6. Problems are not common; however, if there is a substantial amount of bleeding, severe pain, chills, fever or difficulty passing urine (very rare) or other problems, you should call us at [phone] or report to the nearest emergency room.  7. Do not stay seated continuously for more than 2-3 hours for a day or two  after the procedure.  Tighten your buttock muscles 10-15 times every two hours and take 10-15 deep breaths every 1-2 hours.  Do not spend more than a few minutes on the toilet if you cannot empty your bowel; instead re-visit the toilet at a later time.   We have sent the following medications to your pharmacy for you to pick up at your convenience: Cipro and Flagyl, please take for 10 days.  We will call you the beginning of next week to check and see how you are doing.   Thank you for entrusting me with your care and for [facility], Dr. Moran Cellar

## 2017-09-17 NOTE — Progress Notes (Signed)
  67 y/o female here for follow up. Initially referred by Dr. Vicente Males for hemorrhoid banding. Main symptoms have been bleeding and occasional discomfort. LL hemorrhoid banded on 08/10/17. She had pain post procedure, recommended topical nitroglycerin. This relieved her symptoms. Overall, she is doing much better in regards to hemorrhoids - bleeding has mostly stopped.   Incidentally she reports a few days worth of pain on her right upper buttock which is bothering her. No fevers. Exam shows a tender furuncle / possible developing abscess along inner upper right buttock, near gluteal cleft, no purulence or drainage, but erythematous. Discussed options with her. Surgical clinic is closing soon (it's Friday PM at 63), do not have time to get in her in there today. Will start antibiotics to prevent worsening / progression into abscess as below. She was counseled that over the weekend if symptoms do not improve, if she has worsening pain, fevers, etc, she needs to go to urgent care or ER for evaluation for possible drainage. We will contact her Monday otherwise to check on her for this issue. She strongly wanted to proceed with banding today after discussion of options.    PROCEDURE NOTE: The patient presents with symptomatic grade II  hemorrhoids, requesting rubber band ligation of his/her hemorrhoidal disease.  All risks, benefits and alternative forms of therapy were described and informed consent was obtained.  The anorectum was pre-medicated with 0.125% nitroglycerin ointment The decision was made to band the RP internal hemorrhoid, and the Mucar[place]ones was used to perform band ligation without complication. Digital anorectal examination was then performed to assure proper positioning of the band, and to adjust the banded tissue as required. The patient was discharged home without pain or other issues.  Dietary and behavioral recommendations were given and along with follow-up instructions.     The  following adjunctive treatments were recommended: Use nitroglycerin ointment 0.125% q 8 hours PRN spasm Start Ciprofloxacin 500mg  BID and flagyl 500mg  TID for 10 days as outlined above As above, patient counseled that if she feels worsening pain, develops fever, etc, to go to urgent care or ER over the weekend to be evaluated for drainage We will call her to follow up on Monday otherwise to ensure she is improving.   The patient will return in 2-3 weeks for  follow-up and possible additional banding as required. No complications were encountered and the patient tolerated the procedure well.  Needmore Cellar, MD [facility] Gastroenterology Pager [phone]

## 2017-09-29 ENCOUNTER — Ambulatory Visit (INDEPENDENT_AMBULATORY_CARE_PROVIDER_SITE_OTHER): Payer: 59 | Admitting: Rheumatology

## 2017-09-29 DIAGNOSIS — M79642 Pain in left hand: Secondary | ICD-10-CM | POA: Diagnosis not present

## 2017-09-29 DIAGNOSIS — M19042 Primary osteoarthritis, left hand: Secondary | ICD-10-CM

## 2017-09-29 MED ORDER — TRIAMCINOLONE ACETONIDE 40 MG/ML IJ SUSP
10.0000 mg | INTRAMUSCULAR | Status: AC | PRN
  Administered 2017-09-29: 10 mg via INTRA_ARTICULAR

## 2017-09-29 MED ORDER — LIDOCAINE HCL (PF) 1 % IJ SOLN
0.5000 mL | INTRAMUSCULAR | Status: AC | PRN
  Administered 2017-09-29: .5 mL

## 2017-09-29 NOTE — Progress Notes (Signed)
   Procedure Note  Patient: [name]             Date of Birth: 12-25-1950           MRN: [id number]             Visit Date: 09/29/2017  Procedures: Visit Diagnoses: Pain of left hand  Primary osteoarthritis of left hand  Small Joint Inj: L thumb CMC on 09/29/2017 12:59 PM Indications: pain Details: 27 G needle, ultrasound-guided radial approach  Spinal Needle: No  Medications: 0.5 mL lidocaine (PF) 1 %; 10 mg triamcinolone acetonide 40 MG/ML Aspirate: 0 mL Outcome: tolerated well, no immediate complications Procedure, treatment alternatives, risks and benefits explained, specific risks discussed. Consent was given by the patient. Immediately prior to procedure a time out was called to verify the correct patient, procedure, equipment, support staff and site/side marked as required. Patient was prepped and draped in the usual sterile fashion.     Bo Merino, MD

## 2017-10-04 ENCOUNTER — Encounter: Payer: 59 | Admitting: Gastroenterology

## 2017-10-04 ENCOUNTER — Telehealth: Payer: Self-pay | Admitting: Gastroenterology

## 2017-10-16 ENCOUNTER — Other Ambulatory Visit: Payer: Self-pay | Admitting: Rheumatology

## 2017-10-18 ENCOUNTER — Other Ambulatory Visit: Payer: Self-pay | Admitting: Physician Assistant

## 2017-10-18 MED ORDER — DICLOFENAC SODIUM 1 % TD GEL: TRANSDERMAL | 3 refills | Status: DC

## 2017-10-18 NOTE — Telephone Encounter (Signed)
Last visit: 09/17/2017 Next visit: 11/10/2017  Okay to refill per Dr. Estanislado Pandy.

## 2017-10-22 ENCOUNTER — Inpatient Hospital Stay (HOSPITAL_COMMUNITY): Payer: 59 | Attending: Oncology

## 2017-10-22 DIAGNOSIS — Z9884 Bariatric surgery status: Secondary | ICD-10-CM | POA: Diagnosis not present

## 2017-10-22 DIAGNOSIS — K922 Gastrointestinal hemorrhage, unspecified: Secondary | ICD-10-CM | POA: Insufficient documentation

## 2017-10-22 DIAGNOSIS — D500 Iron deficiency anemia secondary to blood loss (chronic): Secondary | ICD-10-CM

## 2017-10-22 DIAGNOSIS — R002 Palpitations: Secondary | ICD-10-CM | POA: Insufficient documentation

## 2017-10-22 LAB — CBC WITH DIFFERENTIAL/PLATELET
BASOS ABS: 0 10*3/uL (ref 0.0–0.1)
BASOS PCT: 0 %
EOS PCT: 2 %
Eosinophils Absolute: 0.1 10*3/uL (ref 0.0–0.7)
HCT: 37.5 % (ref 36.0–46.0)
Hemoglobin: 12.1 g/dL (ref 12.0–15.0)
LYMPHS PCT: 19 %
Lymphs Abs: 1 10*3/uL (ref 0.7–4.0)
MCH: 30.1 pg (ref 26.0–34.0)
MCHC: 32.3 g/dL (ref 30.0–36.0)
MCV: 93.3 fL (ref 78.0–100.0)
Monocytes Absolute: 0.3 10*3/uL (ref 0.1–1.0)
Monocytes Relative: 6 %
Neutro Abs: 3.8 10*3/uL (ref 1.7–7.7)
Neutrophils Relative %: 73 %
PLATELETS: 173 10*3/uL (ref 150–400)
RBC: 4.02 MIL/uL (ref 3.87–5.11)
RDW: 14.3 % (ref 11.5–15.5)
WBC: 5.3 10*3/uL (ref 4.0–10.5)

## 2017-10-22 LAB — COMPREHENSIVE METABOLIC PANEL
ALBUMIN: 3 g/dL — AB (ref 3.5–5.0)
ALT: 25 U/L (ref 14–54)
AST: 22 U/L (ref 15–41)
Alkaline Phosphatase: 79 U/L (ref 38–126)
Anion gap: 9 (ref 5–15)
BUN: 14 mg/dL (ref 6–20)
CHLORIDE: 108 mmol/L (ref 101–111)
CO2: 23 mmol/L (ref 22–32)
CREATININE: 0.5 mg/dL (ref 0.44–1.00)
Calcium: 8.8 mg/dL — ABNORMAL LOW (ref 8.9–10.3)
GFR calc Af Amer: >60 mL/min (ref >60)
GFR calc non Af Amer: >60 mL/min (ref >60)
Glucose, Bld: 88 mg/dL (ref 65–99)
POTASSIUM: 3.5 mmol/L (ref 3.5–5.1)
Sodium: 140 mmol/L (ref 135–145)
Total Bilirubin: 0.3 mg/dL (ref 0.3–1.2)
Total Protein: 5.4 g/dL — ABNORMAL LOW (ref 6.5–8.1)

## 2017-10-22 LAB — IRON AND TIBC
Iron: 76 ug/dL (ref 28–170)
Saturation Ratios: 28 % (ref 10.4–31.8)
TIBC: 269 ug/dL (ref 250–450)
UIBC: 193 ug/dL

## 2017-10-22 LAB — FERRITIN: FERRITIN: 32 ng/mL (ref 11–307)

## 2017-10-22 LAB — VITAMIN B12: VITAMIN B 12: 1034 pg/mL — AB (ref 180–914)

## 2017-10-28 ENCOUNTER — Ambulatory Visit (HOSPITAL_COMMUNITY): Payer: 59 | Admitting: Internal Medicine

## 2017-10-28 NOTE — Progress Notes (Signed)
Office Visit Note  Patient: [name]             Date of Birth: September 06, 1951           MRN: [id number]             PCP: Mar Daring, PA-C Referring: Florian Buff* Visit Date: 11/10/2017 Occupation: @GUAROCC @    Subjective:  Pain in hands   History of Present Illness: [name] is a 67 y.o. female G of osteoarthritis and disc disease.  She states she had good response to the cortisone injection for her right CMC joint.  She is currently symptom-free.  At this point she does not want to have surgery for Dupuytren's contracture.  Her lower back is status stiff.  Activities of Daily Living:  Patient reports morning stiffness for 1 hour.   Patient Reports nocturnal pain.  Difficulty dressing/grooming: Denies Difficulty climbing stairs: Reports Difficulty getting out of chair: Reports Difficulty using hands for taps, buttons, cutlery, and/or writing: Reports   Review of Systems  Constitutional: Positive for fatigue. Negative for night sweats, weight gain, weight loss and weakness.  HENT: Negative for mouth sores, trouble swallowing, trouble swallowing, mouth dryness and nose dryness.   Eyes: Negative for pain, redness, visual disturbance and dryness.  Respiratory: Negative for cough, shortness of breath and difficulty breathing.   Cardiovascular: Negative for chest pain, palpitations, hypertension, irregular heartbeat and swelling in legs/feet.  Gastrointestinal: Negative for abdominal pain, blood in stool, constipation and diarrhea.  Endocrine: Negative for heat intolerance and increased urination.  Genitourinary: Negative for pelvic pain and vaginal dryness.  Musculoskeletal: Positive for arthralgias, joint pain and morning stiffness. Negative for joint swelling, myalgias, muscle weakness, muscle tenderness and myalgias.  Skin: Negative for color change, rash, hair loss, skin tightness, ulcers and sensitivity to sunlight.  Allergic/Immunologic: Negative  for susceptible to infections.  Neurological: Negative for dizziness, light-headedness, headaches, memory loss and night sweats.  Hematological: Negative for bruising/bleeding tendency and swollen glands.  Psychiatric/Behavioral: Negative for depressed mood, confusion and sleep disturbance. The patient is not nervous/anxious.     PMFS History:  Patient Active Problem List   Diagnosis Date Noted  . Primary osteoarthritis of both hands 11/03/2017  . Dupuytren's contracture of left hand 11/03/2017  . DDD (degenerative disc disease), lumbar 11/03/2017  . History of gastric bypass 11/03/2017  . Lymphedema 09/17/2017  . History of vertebral fracture 09/17/2017  . History of osteoporosis 09/17/2017  . Iron deficiency anemia due to chronic blood loss 06/28/2017  . Lower GI bleed 05/31/2017  . Rectal mass 05/31/2017  . Asthma 05/31/2017  . GERD (gastroesophageal reflux disease) 05/31/2017  . Closed fracture of upper end of humerus 09/23/2016  . Tendinitis 09/23/2016  . Patulous eustachian tube of right ear 11/07/2015  . 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 Vitamin D deficiency 08/20/2014  . Secondary hyperparathyroidism ([place]) 08/20/2014    Past Medical History:  Diagnosis Date  . Asthma   . GERD (gastroesophageal reflux disease)   . Internal hemorrhoids   . Iron deficiency anemia   . Osteoporosis   . Peptic ulcer   . Tubular adenoma of colon     Family History  Problem Relation Age of Onset  . Hypertension Other   . Stroke Other   . Diabetes Other   . Heart attack Other   . Obesity Other   . Diabetes Father   . Heart disease Father   . Colon cancer Father   .  Breast cancer Paternal Grandmother   . Healthy Daughter   . Stomach cancer Neg Hx   . Pancreatic cancer Neg Hx    Past Surgical History:  Procedure Laterality Date  . BACK SURGERY    . BREAST SURGERY    . CHOLECYSTECTOMY    . COLONOSCOPY WITH PROPOFOL N/A 06/02/2017   Procedure:  COLONOSCOPY WITH PROPOFOL;  Surgeon: Jonathon Bellows, MD;  Location: [facility] ENDOSCOPY;  Service: Gastroenterology;  Laterality: N/A;  . ESOPHAGOGASTRODUODENOSCOPY (EGD) WITH PROPOFOL N/A 07/01/2017   Procedure: ESOPHAGOGASTRODUODENOSCOPY (EGD) WITH PROPOFOL;  Surgeon: Jonathon Bellows, MD;  Location: [facility] ENDOSCOPY;  Service: Gastroenterology;  Laterality: N/A;  . FLEXIBLE SIGMOIDOSCOPY N/A 07/01/2017   Procedure: FLEXIBLE SIGMOIDOSCOPY;  Surgeon: Jonathon Bellows, MD;  Location: [facility] ENDOSCOPY;  Service: Gastroenterology;  Laterality: N/A;  . KNEE SURGERY    . mini gastric bypass    . SHOULDER SURGERY    . TONSILLECTOMY     Social History   Social History Narrative  . Not on file     Objective: Vital Signs: BP 112/73 (BP Location: Left Arm, Patient Position: Sitting, Cuff Size: Normal)   Pulse 68   Resp 13   Ht 5\' 4"  (1.626 m)   Wt 161 lb (73 kg)   BMI 27.64 kg/m    Physical Exam  Constitutional: She is oriented to person, place, and time. She appears well-developed and well-nourished.  HENT:  Head: Normocephalic and atraumatic.  Eyes: Conjunctivae and EOM are normal.  Neck: Normal range of motion.  Cardiovascular: Normal rate, regular rhythm, normal heart sounds and intact distal pulses.  Pulmonary/Chest: Effort normal and breath sounds normal.  Abdominal: Soft. Bowel sounds are normal.  Lymphadenopathy:    She has no cervical adenopathy.  Neurological: She is alert and oriented to person, place, and time.  Skin: Skin is warm and dry. Capillary refill takes less than 2 seconds.  Psychiatric: She has a normal mood and affect. Her behavior is normal.  Nursing note and vitals reviewed.    Musculoskeletal Exam: C-spine good range of motion.  She has thoracic kyphosis.  Lumbar spine limited range of motion.  Shoulder joints elbow joints wrist joints with good range of motion.  She has bilateral CMC arthritis with DIP PIP thickening consistent with osteoarthritis.  She also has Dupuytren's  contracture in her left third and fourth digit.  Hip joints and knee joints with good range of motion.  CDAI Exam: No CDAI exam completed.    Investigation: No additional findings. CBC Latest Ref Rng & Units 10/22/2017 07/28/2017 07/21/2017  WBC 4.0 - 10.5 K/uL 5.3 5.4 6.4  Hemoglobin 12.0 - 15.0 g/dL 12.1 11.5(L) 11.0(L)  Hematocrit 36.0 - 46.0 % 37.5 36.5 35.6(L)  Platelets 150 - 400 K/uL 173 175 247   CMP Latest Ref Rng & Units 10/22/2017 07/28/2017 07/21/2017  Glucose 65 - 99 mg/dL 88 97 94  BUN 6 - 20 mg/dL 14 17 17   Creatinine 0.44 - 1.00 mg/dL 0.50 0.61 0.54  Sodium 135 - 145 mmol/L 140 138 139  Potassium 3.5 - 5.1 mmol/L 3.5 3.9 3.8  Chloride 101 - 111 mmol/L 108 106 107  CO2 22 - 32 mmol/L 23 27 24   Calcium 8.9 - 10.3 mg/dL 8.8(L) 8.2(L) 8.1(L)  Total Protein 6.5 - 8.1 g/dL 5.4(L) 5.4(L) 5.1(L)  Total Bilirubin 0.3 - 1.2 mg/dL 0.3 0.4 0.4  Alkaline Phos 38 - 126 U/L 79 71 68  AST 15 - 41 U/L 22 29 40  ALT 14 - 54  U/L 25 28 36    Imaging: No results found.  Speciality Comments: No specialty comments available.    Procedures:  No procedures performed Allergies: Lac bovis; Prednisone; Sulfa antibiotics; and Zithromax [azithromycin]   Assessment / Plan:     Visit Diagnoses: Primary osteoarthritis of both hands - Severe arthritis of right CMC.  She had good response to cortisone injection.  Currently she is pain-free.  We had detailed discussion regarding joint protection muscle strengthening.  Stretching exercises were demonstrated in the office today.  She has been advised to use CMC brace on a regular basis when she is active.  Dupuytren's contracture of left hand: I discussed the option of Botox injection which he can discuss with the hand surgeon.  The other option could be surgical correction.  DDD (degenerative disc disease), lumbar: Chronic pain she has some stiffness currently not have much discomfort.  History of osteoporosis: She is currently on Forteo.  She  wanted to discuss treatment options for osteoporosis.  In my opinion once she finishes Forteo she should start on Fosamax or Actonel right away to maintain the bone density.  History of vertebral fracture: She has severe thoracic kyphosis.  Some stretching exercises for the upper back were discussed and demonstrated in the office.  I also encouraged her to join a gym to do water aerobics or other activities.  Vitamin D deficiency: She is on vitamin D.  Secondary hyperparathyroidism ([place])  History of gastric bypass - 2003  Gastroesophageal reflux disease, esophagitis presence not specified  Iron deficiency anemia due to chronic blood loss  History of asthma  B12 deficiency    Orders: No orders of the defined types were placed in this encounter.  No orders of the defined types were placed in this encounter.   Face-to-face time spent with patient was 30 minutes.  Greater than 50% of time was spent in counseling and coordination of care.  Follow-Up Instructions: Return in about 5 months (around 04/09/2018) for Osteoarthritis, DDD,OP.   Bo Merino, MD  Note - This record has been created using Editor, commissioning.  Chart creation errors have been sought, but may not always  have been located. Such creation errors do not reflect on  the standard of medical care.

## 2017-10-29 ENCOUNTER — Ambulatory Visit (HOSPITAL_COMMUNITY): Payer: 59 | Admitting: Internal Medicine

## 2017-11-01 NOTE — Progress Notes (Signed)
[name], [name]   CLINIC:  Medical Oncology/Hematology  PCP:  [name] [address] [name] ([phone]) Burns   REASON FOR VISIT:  Follow-up for Iron deficiency anemia d/t chronic blood loss  CURRENT THERAPY: IV iron prn     HISTORY OF PRESENT ILLNESS:  (From Dr. Laverle Patter note on 07/28/17)  [name] 67 y.o. female presents today with her husband for evaluation of iron deficiency anemia. Patient has been having ongoing bright red blood per rectum since approximately 05/28/2017. She states that she's has Burns history of gastric bypass in 2003 and since that she's had chronically 6-8 bowel movements Burns day. Now every time she has Burns bowel movement she loses fresh blood as well. The blood is not mixed in with the stool. Patient continues to have ongoing bleeding at this time.   She was admitted into the hospital on 05/31/2017 for this issue. At that time her hemoglobin was 9.8 g/dL, hematocrit 30.5%, MCV 85.9.   CT abdomen pelvis with contrast on 05/31/2017 demonstrated Burns 2.8 x 3.2 heterogeneously enhancing lobulated mass in the rectum.   Colonoscopy 06/02/17 by Dr.Kiran Vicente Males demonstrated that the the perianal and digital rectal examinations were normal. Burns few small-mouthed diverticula were found in the sigmoid colon. Non-bleeding internal hemorrhoids were found during retroflexion. The hemorrhoids were small and Grade I (internal hemorrhoids that do not prolapse). The exam was otherwise without abnormality on direct and retroflexion views.   Iron studies from 06/17/2017 demonstrated iron 11, TIBC 405, ferritin 3. Folate was 19.4, vitamin B12 1183. Most recent CBC from 06/24/2017 demonstrated Burns hemoglobin 7.1 g/dL, hematocrit 23.5%, MCV 80.8.     INTERVAL HISTORY:  [name] 67 y.o. female returns for routine follow-up for iron deficiency anemia.   Here today unaccompanied.   Next  Overall, she tells me she has been feeling well.  Appetite 75%; energy level is 50%.  She occasionally struggles with fatigue, but this is largely improved since her last visit.  Her biggest complaint today is new heart palpitations, which started about 1 week ago.  She describes these as "Burns flutter in my chest."  She feels weak and dizzy when these episodes occur.  Denies any syncope.  She has never been diagnosed with any cardiac issues that she is aware of.  She has never formally seen Burns cardiologist.  She does tell me that she has felt more stressed and anxious lately, because her husband was recently diagnosed with Burns skin cancer.  She recently moved to this area, and does not have Burns PCP.  She does have an endocrinologist to help [place] manage her protein and hypocalcemia.  She has chronic diarrhea, which has been an issue since her gastric bypass in remote history.  Denies any frank bleeding episodes including blood in her stools, dark/tarry stools, hematuria, vaginal bleeding, hemoptysis, nosebleeds, or gingival bleeding.  Received IV iron infusions in 06/2017.  Tolerated them well by her report.  Otherwise, she is largely without complaints today.    IV iron admin record:  Oncology Flowsheet 06/30/2017 07/07/2017  ferumoxytol (FERAHEME) IV 510 mg 510 mg     REVIEW OF SYSTEMS:  Review of Systems  Constitutional: Positive for fatigue.  HENT:  Negative.   Eyes: Negative.   Respiratory: Negative.   Cardiovascular: Negative for chest pain.       Heart palpitations  Gastrointestinal: Positive for diarrhea.  Endocrine: Negative.  Genitourinary: Negative.    Musculoskeletal: Negative.   Skin: Negative.   Neurological: Negative.   Hematological: Negative.   Psychiatric/Behavioral: The patient is nervous/anxious.      PAST MEDICAL/SURGICAL HISTORY:  Past Medical History:  Diagnosis Date  . Asthma   . GERD (gastroesophageal reflux disease)   . Internal hemorrhoids   . Iron  deficiency anemia   . Osteoporosis   . Peptic ulcer   . Tubular adenoma of colon    Past Surgical History:  Procedure Laterality Date  . BACK SURGERY    . BREAST SURGERY    . CHOLECYSTECTOMY    . COLONOSCOPY WITH PROPOFOL N/Burns 06/02/2017   Procedure: COLONOSCOPY WITH PROPOFOL;  Surgeon: Jonathon Bellows, MD;  Location: [facility] ENDOSCOPY;  Service: Gastroenterology;  Laterality: N/Burns;  . ESOPHAGOGASTRODUODENOSCOPY (EGD) WITH PROPOFOL N/Burns 07/01/2017   Procedure: ESOPHAGOGASTRODUODENOSCOPY (EGD) WITH PROPOFOL;  Surgeon: Jonathon Bellows, MD;  Location: [facility] ENDOSCOPY;  Service: Gastroenterology;  Laterality: N/Burns;  . FLEXIBLE SIGMOIDOSCOPY N/Burns 07/01/2017   Procedure: FLEXIBLE SIGMOIDOSCOPY;  Surgeon: Jonathon Bellows, MD;  Location: [facility] ENDOSCOPY;  Service: Gastroenterology;  Laterality: N/Burns;  . KNEE SURGERY    . mini gastric bypass    . SHOULDER SURGERY    . TONSILLECTOMY       SOCIAL HISTORY:  Social History   Socioeconomic History  . Marital status: Married    Spouse name: Not on file  . Number of children: Not on file  . Years of education: Not on file  . Highest education level: Not on file  Social Needs  . Financial resource strain: Not on file  . Food insecurity - worry: Not on file  . Food insecurity - inability: Not on file  . Transportation needs - medical: Not on file  . Transportation needs - non-medical: Not on file  Occupational History  . Not on file  Tobacco Use  . Smoking status: Never Smoker  . Smokeless tobacco: Never Used  Substance and Sexual Activity  . Alcohol use: No    Alcohol/week: 0.0 oz  . Drug use: No  . Sexual activity: No  Other Topics Concern  . Not on file  Social History Narrative  . Not on file    FAMILY HISTORY:  Family History  Problem Relation Age of Onset  . Hypertension Other   . Stroke Other   . Diabetes Other   . Heart attack Other   . Obesity Other   . Diabetes Father   . Heart disease Father   . Colon cancer Father   . Breast cancer  Paternal Grandmother   . Healthy Daughter   . Stomach cancer Neg Hx   . Pancreatic cancer Neg Hx     CURRENT MEDICATIONS:  Outpatient Encounter Medications as of 11/02/2017  Medication Sig  . calcitRIOL (ROCALTROL) 0.5 MCG capsule Take 0.5 mcg by mouth daily.  . Cholecalciferol (VITAMIN D3) 5000 UNITS CAPS Take 1 capsule by mouth daily.   . diclofenac sodium (VOLTAREN) 1 % GEL Apply three grams to three large joints up to three times daily.  Marland Kitchen moxifloxacin (AVELOX) 400 MG tablet Take 1 tablet (400 mg total) by mouth daily at 8 pm.  . Multiple Vitamin (MULTIVITAMIN) tablet Take 1 tablet by mouth 2 (two) times daily.   . pantoprazole (PROTONIX) 40 MG tablet Take 40 mg by mouth every morning.  . Teriparatide, Recombinant, (FORTEO) 600 MCG/2.4ML SOLN Inject 20 mcg into the skin.  Marland Kitchen VITAMIN Burns PO Take 1 capsule by mouth daily.   Marland Kitchen  VITAMIN E PO Take 1 capsule by mouth daily.   . [DISCONTINUED] benzonatate (TESSALON) 100 MG capsule Take 1 capsule (100 mg total) by mouth 2 (two) times daily as needed for cough.  . [DISCONTINUED] ciprofloxacin (CIPRO) 500 MG tablet Take 1 tablet (500 mg total) by mouth 2 (two) times daily. For 10 days  . [DISCONTINUED] metroNIDAZOLE (FLAGYL) 500 MG tablet Take 1 tablet (500 mg total) by mouth 3 (three) times daily. For 10 days   No facility-administered encounter medications on file as of 11/02/2017.     ALLERGIES:  Allergies  Allergen Reactions  . Lac Bovis Diarrhea  . Prednisone Diarrhea  . Sulfa Antibiotics Itching  . Zithromax [Azithromycin] Diarrhea     PHYSICAL EXAM:  ECOG Performance status: 1 - Mildly symptomatic, but improved; remains independent   Vitals:   11/02/17 0834  BP: 95/73  Pulse: 71  Resp: 16  Temp: 97.9 F (36.6 C)   Filed Weights   11/02/17 0834  Weight: 159 lb 1.6 oz (72.2 kg)    Physical Exam  Constitutional: She is oriented to person, place, and time and well-developed, well-nourished, and in no distress.  HENT:   Head: Normocephalic.  Mouth/Throat: Oropharynx is clear and moist. No oropharyngeal exudate.  Eyes: Conjunctivae are normal. Pupils are equal, round, and reactive to light. No scleral icterus.  Neck: Normal range of motion. Neck supple.  Cardiovascular: Normal rate, regular rhythm and normal heart sounds.  Pulmonary/Chest: Effort normal and breath sounds normal. No respiratory distress.  Abdominal: Soft. Bowel sounds are normal. There is no tenderness.  Musculoskeletal: Normal range of motion. She exhibits edema (BLE edema; reported h/o bilat leg lymphedema (bilat leg wrappings in place)).  Lymphadenopathy:    She has no cervical adenopathy.       Right: No supraclavicular adenopathy present.       Left: No supraclavicular adenopathy present.  Neurological: She is alert and oriented to person, place, and time. No cranial nerve deficit.  Ambulates with cane   Skin: Skin is warm and dry. No rash noted.  Psychiatric: Mood, memory, affect and judgment normal.  Nursing note and vitals reviewed.    LABORATORY DATA:  I have reviewed the labs as listed.  CBC    Component Value Date/Time   WBC 5.3 10/22/2017 1056   RBC 4.02 10/22/2017 1056   HGB 12.1 10/22/2017 1056   HGB 10.8 (L) 09/25/2016 1518   HCT 37.5 10/22/2017 1056   HCT 32.6 (L) 09/25/2016 1518   PLT 173 10/22/2017 1056   PLT 264 09/25/2016 1518   MCV 93.3 10/22/2017 1056   MCV 96 09/25/2016 1518   MCH 30.1 10/22/2017 1056   MCHC 32.3 10/22/2017 1056   RDW 14.3 10/22/2017 1056   RDW 15.5 (H) 09/25/2016 1518   LYMPHSABS 1.0 10/22/2017 1056   LYMPHSABS 1.2 09/25/2016 1518   MONOABS 0.3 10/22/2017 1056   EOSABS 0.1 10/22/2017 1056   EOSABS 0.1 09/25/2016 1518   BASOSABS 0.0 10/22/2017 1056   BASOSABS 0.0 09/25/2016 1518   CMP Latest Ref Rng & Units 10/22/2017 07/28/2017 07/21/2017  Glucose 65 - 99 mg/dL 88 97 94  BUN 6 - 20 mg/dL 14 17 17   Creatinine 0.44 - 1.00 mg/dL 0.50 0.61 0.54  Sodium 135 - 145 mmol/L 140 138 139   Potassium 3.5 - 5.1 mmol/L 3.5 3.9 3.8  Chloride 101 - 111 mmol/L 108 106 107  CO2 22 - 32 mmol/L 23 27 24   Calcium 8.9 - 10.3 mg/dL 8.8(L)  8.2(L) 8.1(L)  Total Protein 6.5 - 8.1 g/dL 5.4(L) 5.4(L) 5.1(L)  Total Bilirubin 0.3 - 1.2 mg/dL 0.3 0.4 0.4  Alkaline Phos 38 - 126 U/L 79 71 68  AST 15 - 41 U/L 22 29 40  ALT 14 - 54 U/L 25 28 36   Results for Burns, [name] (MRN [id number])   Ref. Range 10/22/2017 10:57  Iron Latest Ref Range: 28 - 170 ug/dL 76  UIBC Latest Units: ug/dL 193  TIBC Latest Ref Range: 250 - 450 ug/dL 269  Saturation Ratios Latest Ref Range: 10.4 - 31.8 % 28  Ferritin Latest Ref Range: 11 - 307 ng/mL 32  Vitamin B12 Latest Ref Range: 180 - 914 pg/mL 1,034 (H)   PENDING LABS:    DIAGNOSTIC IMAGING:   PATHOLOGY:        ASSESSMENT & PLAN:   Iron deficiency anemia d/t chronic blood loss:  -Thought to be secondary to GI blood loss.   -Received 2 doses of IV iron in 06/2017 Oncology Flowsheet 06/30/2017 07/07/2017  ferumoxytol [facility]) IV 510 mg 510 mg  -Recent iron studies collected on 10/22/17 reviewed with patient and are normal. Hgb has normalized and is 12.1 g/dL. Not likely to benefit from oral iron given her h/o gastric bypass surgery, as she will likely have malabsorption of oral iron as Burns result.  -Clinically, she feels better than previous. Denies any frank bleeding episodes.  -Labs only in 2 months.  -Return to cancer center in 4 months for follow-up with labs.   Intermittent heart palpitations:  -Per patient, started ~1 week ago. States that she feels Burns "flutter in my chest." Also has associated dizziness and weakness during this time.  Reports that she has been more stressed lately because her husband was recently diagnosed with skin cancer. We discussed that she may have paroxysmal atrial fibrillation or PVCs that may be d/t recent stress/anxiety. Cardiac exam is benign today.  Shared with her that I do not think these heart palpitations are  secondary to her anemia, given that her hemoglobin has adequately recovered and is now normal.  Either way, I recommend further evaluation by cardiology specialists. She agreed. We will help facilitate this referral.   -Will refer her to cardiology for evaluation.   Health maintenance/Wellness:  -Reportedly up-to-date with her mammogram.  -Patient has recently moved to this area and does not have an established PCP yet.  Burns list of local PCPs with their practice addresses/phone numbers provided to patient today. Encouraged her to do her research and call Burns few practices to get established when able.       Dispo:  -Labs only in 2 months.  -Return to cancer center in 4 months for follow-up with labs.    All questions were answered to patient's stated satisfaction. Encouraged patient to call with any new concerns or questions before her next visit to the cancer center and we can certain see her sooner, if needed.        Orders placed this encounter:  Orders Placed This Encounter  Procedures  . CBC with Differential/Platelet  . Ferritin  . Iron and TIBC      Mike Craze, NP [place] [phone]

## 2017-11-02 ENCOUNTER — Encounter (HOSPITAL_COMMUNITY): Payer: Self-pay | Admitting: Adult Health

## 2017-11-02 ENCOUNTER — Other Ambulatory Visit: Payer: Self-pay

## 2017-11-02 ENCOUNTER — Inpatient Hospital Stay (HOSPITAL_COMMUNITY): Payer: 59 | Admitting: Adult Health

## 2017-11-02 VITALS — BP 95/73 | HR 71 | Temp 97.9°F | Resp 16 | Ht 63.0 in | Wt 159.1 lb

## 2017-11-02 DIAGNOSIS — K922 Gastrointestinal hemorrhage, unspecified: Secondary | ICD-10-CM

## 2017-11-02 DIAGNOSIS — R002 Palpitations: Secondary | ICD-10-CM

## 2017-11-02 DIAGNOSIS — Z9884 Bariatric surgery status: Secondary | ICD-10-CM | POA: Diagnosis not present

## 2017-11-02 DIAGNOSIS — D500 Iron deficiency anemia secondary to blood loss (chronic): Secondary | ICD-10-CM

## 2017-11-02 NOTE — Patient Instructions (Signed)
[name] at [facility] Discharge Instructions  RECOMMENDATIONS MADE BY THE CONSULTANT AND ANY TEST RESULTS WILL BE SENT TO YOUR REFERRING PHYSICIAN.  You were seen today by Mike Craze NP. She is going to refer you to Cardiology for palpitations. Return in 2 months for labs. Return in 4 months for labs and follow up.   Thank you for choosing [place] at [facility] to provide your oncology and hematology care.  To afford each patient quality time with our provider, please arrive at least 15 minutes before your scheduled appointment time.    If you have a lab appointment with the [facility] please come in thru the  Main Entrance and check in at the main information desk  You need to re-schedule your appointment should you arrive 10 or more minutes late.  We strive to give you quality time with our providers, and arriving late affects you and other patients whose appointments are after yours.  Also, if you no show three or more times for appointments you may be dismissed from the clinic at the providers discretion.     Again, thank you for choosing [facility]  Our hope is that these requests will decrease the amount of time that you wait before being seen by our physicians.       _____________________________________________________________  Should you have questions after your visit to [facility], please contact our office at [phone] between the hours of 8:30 a.m. and 4:30 p.m.  Voicemails left after 4:30 p.m. will not be returned until the following business day.  For prescription refill requests, have your pharmacy contact our office.       Resources For Cancer Patients and their Caregivers ? American Cancer Society: Can assist with transportation, wigs, general needs, runs Look Good Feel Better.        3302535774 ? Cancer Care: Provides financial assistance, online support groups,  medication/co-pay assistance.  1-800-813-HOPE (503) 261-4889) ? [facility] Assists [place] cancer patients and their families through emotional , educational and financial support.  574-852-9426 ? [place] DSS Where to apply for food stamps, Medicaid and utility assistance. 989-787-4643 ? [employer]: Transportation to medical appointments. 2501117491 ? Social Security Administration: May apply for disability if have a Stage IV cancer. (820) 609-2130 830-356-5635 ? [employer], Disability and Transit Services: Assists with nutrition, care and transit needs. [facility] Support Programs: @10RELATIVEDAYS @ > Cancer Support Group  2nd Tuesday of the month 1pm-2pm, Journey Room  > Creative Journey  3rd Tuesday of the month 1130am-1pm, Journey Room  > Look Good Feel Better  1st Wednesday of the month 10am-12 noon, Journey Room (Call [place] to register [phone])

## 2017-11-03 DIAGNOSIS — M19042 Primary osteoarthritis, left hand: Principal | ICD-10-CM

## 2017-11-03 DIAGNOSIS — M19041 Primary osteoarthritis, right hand: Secondary | ICD-10-CM | POA: Insufficient documentation

## 2017-11-03 DIAGNOSIS — Z9884 Bariatric surgery status: Secondary | ICD-10-CM | POA: Insufficient documentation

## 2017-11-03 DIAGNOSIS — M720 Palmar fascial fibromatosis [Dupuytren]: Secondary | ICD-10-CM | POA: Insufficient documentation

## 2017-11-03 DIAGNOSIS — M51369 Other intervertebral disc degeneration, lumbar region without mention of lumbar back pain or lower extremity pain: Secondary | ICD-10-CM | POA: Insufficient documentation

## 2017-11-03 DIAGNOSIS — M5136 Other intervertebral disc degeneration, lumbar region: Secondary | ICD-10-CM | POA: Insufficient documentation

## 2017-11-10 ENCOUNTER — Ambulatory Visit: Payer: Self-pay | Admitting: Rheumatology

## 2017-11-10 ENCOUNTER — Encounter: Payer: Self-pay | Admitting: Rheumatology

## 2017-11-10 ENCOUNTER — Ambulatory Visit: Payer: 59 | Admitting: Rheumatology

## 2017-11-10 VITALS — BP 112/73 | HR 68 | Resp 13 | Ht 64.0 in | Wt 161.0 lb

## 2017-11-10 DIAGNOSIS — K219 Gastro-esophageal reflux disease without esophagitis: Secondary | ICD-10-CM | POA: Diagnosis not present

## 2017-11-10 DIAGNOSIS — N2581 Secondary hyperparathyroidism of renal origin: Secondary | ICD-10-CM

## 2017-11-10 DIAGNOSIS — E559 Vitamin D deficiency, unspecified: Secondary | ICD-10-CM

## 2017-11-10 DIAGNOSIS — Z9884 Bariatric surgery status: Secondary | ICD-10-CM

## 2017-11-10 DIAGNOSIS — E538 Deficiency of other specified B group vitamins: Secondary | ICD-10-CM

## 2017-11-10 DIAGNOSIS — M720 Palmar fascial fibromatosis [Dupuytren]: Secondary | ICD-10-CM | POA: Diagnosis not present

## 2017-11-10 DIAGNOSIS — D500 Iron deficiency anemia secondary to blood loss (chronic): Secondary | ICD-10-CM

## 2017-11-10 DIAGNOSIS — Z8739 Personal history of other diseases of the musculoskeletal system and connective tissue: Secondary | ICD-10-CM | POA: Diagnosis not present

## 2017-11-10 DIAGNOSIS — M19041 Primary osteoarthritis, right hand: Secondary | ICD-10-CM | POA: Diagnosis not present

## 2017-11-10 DIAGNOSIS — M5136 Other intervertebral disc degeneration, lumbar region: Secondary | ICD-10-CM | POA: Diagnosis not present

## 2017-11-10 DIAGNOSIS — Z8781 Personal history of (healed) traumatic fracture: Secondary | ICD-10-CM

## 2017-11-10 DIAGNOSIS — Z8709 Personal history of other diseases of the respiratory system: Secondary | ICD-10-CM

## 2017-11-10 DIAGNOSIS — M19042 Primary osteoarthritis, left hand: Secondary | ICD-10-CM

## 2017-11-11 ENCOUNTER — Ambulatory Visit: Payer: 59 | Admitting: Gastroenterology

## 2017-11-11 ENCOUNTER — Encounter: Payer: Self-pay | Admitting: Gastroenterology

## 2017-11-11 VITALS — BP 118/70 | HR 72 | Ht 61.0 in | Wt 157.4 lb

## 2017-11-11 DIAGNOSIS — K648 Other hemorrhoids: Secondary | ICD-10-CM

## 2017-11-11 NOTE — Progress Notes (Signed)
67 y/o female here for follow up hemorrhoid banding. She had RP and LL hemorrhoid already banded with resolution of mostly all of her bleeding symptoms, she's had a nice response to therapy. Here for final banding. She wanted to proceed   PROCEDURE NOTE: The patient presents with symptomatic grade II  hemorrhoids, requesting rubber band ligation of his/her hemorrhoidal disease.  All risks, benefits and alternative forms of therapy were described and informed consent was obtained.   The anorectum was pre-medicated with 0.125% nitroglycerin The decision was made to band the RA internal hemorrhoid, and the [place] was used to perform band ligation without complication.  Digital anorectal examination was then performed to assure proper positioning of the band, and to adjust the banded tissue as required.  The patient was discharged home without pain or other issues.  Dietary and behavioral recommendations were given and along with follow-up instructions.     The patient will return as needed for follow-up and possible additional banding as required. No further scheduled bandings. No complications were encountered and the patient tolerated the procedure well.  Dent Cellar, MD [facility] Gastroenterology Pager [phone]

## 2017-11-29 ENCOUNTER — Encounter: Payer: Self-pay | Admitting: Cardiovascular Disease

## 2017-11-29 ENCOUNTER — Encounter: Payer: Self-pay | Admitting: *Deleted

## 2017-11-29 ENCOUNTER — Ambulatory Visit: Payer: 59 | Admitting: Cardiovascular Disease

## 2017-11-29 ENCOUNTER — Telehealth: Payer: Self-pay | Admitting: Cardiovascular Disease

## 2017-11-29 VITALS — BP 110/70 | HR 77 | Ht 61.0 in | Wt 154.0 lb

## 2017-11-29 DIAGNOSIS — R0789 Other chest pain: Secondary | ICD-10-CM | POA: Diagnosis not present

## 2017-11-29 DIAGNOSIS — R002 Palpitations: Secondary | ICD-10-CM

## 2017-11-29 DIAGNOSIS — K219 Gastro-esophageal reflux disease without esophagitis: Secondary | ICD-10-CM

## 2017-11-29 NOTE — Patient Instructions (Signed)
Medication Instructions:  Continue all current medications.  Labwork: none  Testing/Procedures:  Your physician has requested that you have an echocardiogram. Echocardiography is a painless test that uses sound waves to create images of your heart. It provides your doctor with information about the size and shape of your heart and how well your heart's chambers and valves are working. This procedure takes approximately one hour. There are no restrictions for this procedure.  Your physician has requested that you have a lexiscan myoview. For further information please visit [email or web address]. Please follow instruction sheet, as given.  Your physician has recommended that you wear a 7 day event monitor. Event monitors are medical devices that record the heart's electrical activity. Doctors most often [place] these monitors to diagnose arrhythmias. Arrhythmias are problems with the speed or rhythm of the heartbeat. The monitor is a small, portable device. You can wear one while you do your normal daily activities. This is usually used to diagnose what is causing palpitations/syncope (passing out).  Office will contact with results via phone or letter.    Follow-Up: 6 weeks   Any Other Special Instructions Will Be Listed Below (If Applicable).  If you need a refill on your cardiac medications before your next appointment, please call your pharmacy.'

## 2017-11-29 NOTE — Addendum Note (Signed)
Addended by: Laurine Blazer on: 11/29/2017 03:24 PM   Modules accepted: Orders

## 2017-11-29 NOTE — Telephone Encounter (Signed)
*    lexiscan - chest pressure  Schedule at [facility] December 07, 2017    *Echo - chest pressure, palpitations December 02, 2017 in [place]

## 2017-11-29 NOTE — Progress Notes (Addendum)
CARDIOLOGY CONSULT NOTE  Patient ID: [name] MRN: [id number] DOB/AGE: [birth year] 67 y.o.  Admit date: (Not on file) Primary Physician: Mar Daring, PA-C Referring Physician: Mike Craze, NP  Reason for Consultation: Palpitations  HPI: [name] is a 67 y.o. female who is being seen today for the evaluation of palpitations at the request of Holley Bouche, NP.   Past medical history includes iron deficiency anemia, chronic lower extremity lymphedema, and diffuse osteoarthritis.  She has a history of gastric bypass surgery in 2003.  She saw her hematologist on 11/02/17 and complained of palpitations for which she is referred today.  Most recent CBC was normal on 10/22/17.  She tells me she saw her endocrinologist, Dr. Delrae Rend, earlier this morning but has been seeing him for several years.  She lives in [place] and moved from [place], [place], about 8 or 9 months ago with her husband to be close to her daughter who lives in [place] and recently had a baby.  She tells me she has been experiencing palpitations for several months.  They can occur both with and without exertion.  She experiences a forceful contraction followed by a pause after which she feels some shortness of breath and lightheadedness.  She denies exertional chest pain but has been having retrosternal chest pressure radiating into her neck.  She feels markedly fatigued compared to 1 year ago.  She denies orthopnea and paroxysmal nocturnal dyspnea.  She walks with a cane and has had issues with dizziness.  ECG performed in the office today which I ordered and personally interpreted demonstrates normal sinus rhythm with no ischemic ST segment or T-wave abnormalities, nor any arrhythmias.  She said she used to be very active.   Family history: Her father died of an MI at age 59.  He developed coronary artery disease in his early 96s and underwent four-vessel CABG at age  75.  Soc Hx: She lives in [place] and moved from [place], [place], about 8 or 9 months ago with her husband to be close to her daughter who lives in [place] and recently had a baby.  Allergies  Allergen Reactions  . Lac Bovis Diarrhea  . Prednisone Diarrhea  . Sulfa Antibiotics Itching  . Zithromax [Azithromycin] Diarrhea    Current Outpatient Medications  Medication Sig Dispense Refill  . calcitRIOL (ROCALTROL) 0.5 MCG capsule Take 0.5 mcg by mouth daily.  5  . Cholecalciferol (VITAMIN D3) 5000 UNITS CAPS Take 1 capsule by mouth daily.     . diclofenac sodium (VOLTAREN) 1 % GEL Apply three grams to three large joints up to three times daily. 3 Tube 3  . moxifloxacin (AVELOX) 400 MG tablet Take 1 tablet (400 mg total) by mouth daily at 8 pm. 10 tablet 0  . Multiple Vitamin (MULTIVITAMIN) tablet Take 1 tablet by mouth 2 (two) times daily.     . pantoprazole (PROTONIX) 40 MG tablet Take 40 mg by mouth every morning.  3  . Teriparatide, Recombinant, (FORTEO) 600 MCG/2.4ML SOLN Inject 20 mcg into the skin.    Marland Kitchen VITAMIN A PO Take 1 capsule by mouth daily.     Marland Kitchen VITAMIN E PO Take 1 capsule by mouth daily.      No current facility-administered medications for this visit.     Past Medical History:  Diagnosis Date  . Asthma   . GERD (gastroesophageal reflux disease)   . Internal hemorrhoids   .  Iron deficiency anemia   . Osteoporosis   . Peptic ulcer   . Tubular adenoma of colon     Past Surgical History:  Procedure Laterality Date  . BACK SURGERY    . BREAST SURGERY    . CHOLECYSTECTOMY    . COLONOSCOPY WITH PROPOFOL N/A 06/02/2017   Procedure: COLONOSCOPY WITH PROPOFOL;  Surgeon: Jonathon Bellows, MD;  Location: [facility] ENDOSCOPY;  Service: Gastroenterology;  Laterality: N/A;  . ESOPHAGOGASTRODUODENOSCOPY (EGD) WITH PROPOFOL N/A 07/01/2017   Procedure: ESOPHAGOGASTRODUODENOSCOPY (EGD) WITH PROPOFOL;  Surgeon: Jonathon Bellows, MD;  Location: [facility] ENDOSCOPY;  Service:  Gastroenterology;  Laterality: N/A;  . FLEXIBLE SIGMOIDOSCOPY N/A 07/01/2017   Procedure: FLEXIBLE SIGMOIDOSCOPY;  Surgeon: Jonathon Bellows, MD;  Location: [facility] ENDOSCOPY;  Service: Gastroenterology;  Laterality: N/A;  . KNEE SURGERY    . mini gastric bypass    . SHOULDER SURGERY    . TONSILLECTOMY      Social History   Socioeconomic History  . Marital status: Married    Spouse name: Not on file  . Number of children: Not on file  . Years of education: Not on file  . Highest education level: Not on file  Social Needs  . Financial resource strain: Not on file  . Food insecurity - worry: Not on file  . Food insecurity - inability: Not on file  . Transportation needs - medical: Not on file  . Transportation needs - non-medical: Not on file  Occupational History  . Not on file  Tobacco Use  . Smoking status: Never Smoker  . Smokeless tobacco: Never Used  Substance and Sexual Activity  . Alcohol use: No    Alcohol/week: 0.0 oz  . Drug use: No  . Sexual activity: No  Other Topics Concern  . Not on file  Social History Narrative  . Not on file      Current Meds  Medication Sig  . calcitRIOL (ROCALTROL) 0.5 MCG capsule Take 0.5 mcg by mouth daily.  . Cholecalciferol (VITAMIN D3) 5000 UNITS CAPS Take 1 capsule by mouth daily.   . diclofenac sodium (VOLTAREN) 1 % GEL Apply three grams to three large joints up to three times daily.  Marland Kitchen moxifloxacin (AVELOX) 400 MG tablet Take 1 tablet (400 mg total) by mouth daily at 8 pm.  . Multiple Vitamin (MULTIVITAMIN) tablet Take 1 tablet by mouth 2 (two) times daily.   . pantoprazole (PROTONIX) 40 MG tablet Take 40 mg by mouth every morning.  . Teriparatide, Recombinant, (FORTEO) 600 MCG/2.4ML SOLN Inject 20 mcg into the skin.  Marland Kitchen VITAMIN A PO Take 1 capsule by mouth daily.   Marland Kitchen VITAMIN E PO Take 1 capsule by mouth daily.       Review of systems complete and found to be negative unless listed above in HPI    Physical exam Blood  pressure 110/70, pulse 77, height 5\' 1"  (1.549 m), weight 154 lb (69.9 kg), SpO2 97 %. General: NAD Neck: No JVD, no thyromegaly or thyroid nodule.  Lungs: Clear to auscultation bilaterally with normal respiratory effort. CV: Nondisplaced PMI. Regular rate and rhythm, normal S1/S2, no S3/S4, no murmur.  Bilateral lower extremity lymphedema.  No carotid bruit.    Abdomen: Soft, nontender, no distention.  Skin: Intact without lesions or rashes.  Neurologic: Alert and oriented x 3.  Psych: Normal affect. Extremities: No clubbing or cyanosis.  HEENT: Normal.   ECG: Most recent ECG reviewed.   Labs: Lab Results  Component Value Date/Time   K 3.5  10/22/2017 10:56 AM   BUN 14 10/22/2017 10:56 AM   BUN 4 (L) 09/25/2016 03:18 PM   CREATININE 0.50 10/22/2017 10:56 AM   ALT 25 10/22/2017 10:56 AM   TSH 4.960 (H) 09/25/2016 03:18 PM   HGB 12.1 10/22/2017 10:56 AM   HGB 10.8 (L) 09/25/2016 03:18 PM     Lipids: No results found for: LDLCALC, LDLDIRECT, CHOL, TRIG, HDL      ASSESSMENT AND PLAN:  1.  Palpitations: She may be experiencing premature atrial and/or ventricular contractions but could also be experiencing paroxysmal supraventricular arrhythmias.  I will obtain a 1 week event monitor for further characterization.  2.  Chest pressure with radiation to the neck: Given her father's history of premature coronary artery disease, I want to exclude hemodynamically significant coronary artery disease.  She is unable to walk on a treadmill. I will proceed with a nuclear myocardial perfusion imaging study to evaluate for ischemic heart disease (Lexiscan Myoview). I will order a 2-D echocardiogram with Doppler to evaluate cardiac structure, function, and regional wall motion.  3. GERD: Symptoms appear to be stable on Protonix 40 mg daily.   Disposition: Follow up in 6 weeks    Signed: Kate Sable, M.D., F.A.C.C.  11/29/2017, 2:50 PM

## 2017-12-01 ENCOUNTER — Other Ambulatory Visit: Payer: Self-pay | Admitting: Cardiovascular Disease

## 2017-12-01 DIAGNOSIS — R0789 Other chest pain: Secondary | ICD-10-CM

## 2017-12-02 ENCOUNTER — Other Ambulatory Visit: Payer: 59

## 2017-12-06 NOTE — Telephone Encounter (Signed)
Pre-cert Verification for the following procedure   Patient has re-scheduled Echo for 12-09-17 at [facility] office

## 2017-12-07 ENCOUNTER — Encounter (HOSPITAL_COMMUNITY)
Admission: RE | Admit: 2017-12-07 | Discharge: 2017-12-07 | Disposition: A | Discharge status: Home / Self Care | Payer: 59 | Source: Ambulatory Visit | Attending: Cardiovascular Disease | Admitting: Cardiovascular Disease

## 2017-12-07 ENCOUNTER — Encounter (HOSPITAL_BASED_OUTPATIENT_CLINIC_OR_DEPARTMENT_OTHER)
Admission: RE | Admit: 2017-12-07 | Discharge: 2017-12-07 | Disposition: A | Discharge status: Home / Self Care | Payer: 59 | Source: Ambulatory Visit | Attending: Cardiovascular Disease | Admitting: Cardiovascular Disease

## 2017-12-07 DIAGNOSIS — R0789 Other chest pain: Secondary | ICD-10-CM | POA: Diagnosis not present

## 2017-12-07 LAB — NM MYOCAR MULTI W/SPECT W/WALL MOTION / EF
CHL CUP NUCLEAR SDS: 3
CHL CUP NUCLEAR SSS: 11
LHR: 0.22
LVDIAVOL: 95 mL (ref 46–106)
LVSYSVOL: 32 mL
NUC STRESS TID: 1.08
Peak HR: 96 {beats}/min
Rest HR: 68 {beats}/min
SRS: 8

## 2017-12-07 MED ORDER — SODIUM CHLORIDE 0.9% FLUSH
INTRAVENOUS | Status: AC
  Administered 2017-12-07: 10 mL via INTRAVENOUS
  Filled 2017-12-07: qty 10

## 2017-12-07 MED ORDER — REGADENOSON 0.4 MG/5ML IV SOLN
INTRAVENOUS | Status: AC
  Administered 2017-12-07: 0.4 mg via INTRAVENOUS
  Filled 2017-12-07: qty 5

## 2017-12-07 MED ORDER — TECHNETIUM TC 99M TETROFOSMIN IV KIT
10.0000 | PACK | Freq: Once | INTRAVENOUS | Status: AC | PRN
  Administered 2017-12-07: 10 via INTRAVENOUS

## 2017-12-07 MED ORDER — TECHNETIUM TC 99M TETROFOSMIN IV KIT
30.0000 | PACK | Freq: Once | INTRAVENOUS | Status: AC | PRN
  Administered 2017-12-07: 30 via INTRAVENOUS

## 2017-12-08 ENCOUNTER — Telehealth: Payer: Self-pay | Admitting: *Deleted

## 2017-12-08 NOTE — Telephone Encounter (Signed)
Notes recorded by Laurine Blazer, LPN on 09/25/1622 at 4:69 PM EDT Patient notified. Copy to pmd. Follow up scheduled for 01/24/2018. Echo scheduled for tomorrow.   ------  Notes recorded by Herminio Commons, MD on 12/07/2017 at 4:40 PM EDT There appears to be some "shadowing artifact" over the heart, but a small blockage cannot entirely be ruled out. Await echocardiogram results. Will discuss further at follow up appt.

## 2017-12-09 ENCOUNTER — Encounter: Payer: Self-pay | Admitting: *Deleted

## 2017-12-09 ENCOUNTER — Ambulatory Visit (INDEPENDENT_AMBULATORY_CARE_PROVIDER_SITE_OTHER): Payer: 59

## 2017-12-09 ENCOUNTER — Other Ambulatory Visit: Payer: Self-pay

## 2017-12-09 DIAGNOSIS — R0789 Other chest pain: Secondary | ICD-10-CM

## 2017-12-15 ENCOUNTER — Ambulatory Visit (INDEPENDENT_AMBULATORY_CARE_PROVIDER_SITE_OTHER): Payer: 59

## 2017-12-15 ENCOUNTER — Telehealth: Payer: Self-pay | Admitting: *Deleted

## 2017-12-15 DIAGNOSIS — R002 Palpitations: Secondary | ICD-10-CM | POA: Diagnosis not present

## 2017-12-15 NOTE — Telephone Encounter (Signed)
Notes recorded by Laurine Blazer, LPN on 12/15/8885 at 5:79 PM EDT Patient notified. Copy to pmd. Follow up scheduled for 01/24/2018.   ------  Notes recorded by Herminio Commons, MD on 12/13/2017 at 11:49 AM EDT Normal cardiac function with mild valve leakage.

## 2017-12-24 ENCOUNTER — Encounter (HOSPITAL_COMMUNITY)
Admission: RE | Admit: 2017-12-24 | Discharge: 2017-12-24 | Disposition: A | Discharge status: Home / Self Care | Payer: 59 | Source: Ambulatory Visit | Attending: Internal Medicine | Admitting: Internal Medicine

## 2017-12-24 ENCOUNTER — Encounter (HOSPITAL_COMMUNITY): Payer: Self-pay

## 2017-12-24 DIAGNOSIS — M810 Age-related osteoporosis without current pathological fracture: Secondary | ICD-10-CM | POA: Diagnosis not present

## 2017-12-24 MED ORDER — ZOLEDRONIC ACID 5 MG/100ML IV SOLN
5.0000 mg | Freq: Once | INTRAVENOUS | Status: AC
  Administered 2017-12-24: 5 mg via INTRAVENOUS

## 2017-12-24 MED ORDER — SODIUM CHLORIDE 0.9 % IV SOLN
Freq: Once | INTRAVENOUS | Status: AC
  Administered 2017-12-24: 10:00:00 via INTRAVENOUS

## 2017-12-24 MED ORDER — ZOLEDRONIC ACID 5 MG/100ML IV SOLN
INTRAVENOUS | Status: AC
  Filled 2017-12-24: qty 100

## 2017-12-24 NOTE — Discharge Instructions (Signed)

## 2017-12-24 NOTE — Progress Notes (Signed)
Patient completed first Reclast infusion without any adverse effects.  Education material reviewed and verbalized understanding.  Appointment made for 1 year from today.

## 2017-12-30 ENCOUNTER — Inpatient Hospital Stay (HOSPITAL_COMMUNITY): Payer: 59 | Attending: Hematology

## 2017-12-30 DIAGNOSIS — D500 Iron deficiency anemia secondary to blood loss (chronic): Secondary | ICD-10-CM | POA: Diagnosis not present

## 2017-12-30 DIAGNOSIS — K922 Gastrointestinal hemorrhage, unspecified: Secondary | ICD-10-CM | POA: Diagnosis present

## 2017-12-30 LAB — CBC WITH DIFFERENTIAL/PLATELET
Basophils Absolute: 0 10*3/uL (ref 0.0–0.1)
Basophils Relative: 1 %
EOS ABS: 0.1 10*3/uL (ref 0.0–0.7)
Eosinophils Relative: 3 %
HCT: 39.3 % (ref 36.0–46.0)
HEMOGLOBIN: 12.5 g/dL (ref 12.0–15.0)
LYMPHS ABS: 1.2 10*3/uL (ref 0.7–4.0)
Lymphocytes Relative: 27 %
MCH: 30.8 pg (ref 26.0–34.0)
MCHC: 31.8 g/dL (ref 30.0–36.0)
MCV: 96.8 fL (ref 78.0–100.0)
MONO ABS: 0.3 10*3/uL (ref 0.1–1.0)
MONOS PCT: 6 %
Neutro Abs: 2.7 10*3/uL (ref 1.7–7.7)
Neutrophils Relative %: 63 %
Platelets: 209 10*3/uL (ref 150–400)
RBC: 4.06 MIL/uL (ref 3.87–5.11)
RDW: 13.4 % (ref 11.5–15.5)
WBC: 4.2 10*3/uL (ref 4.0–10.5)

## 2017-12-30 LAB — IRON AND TIBC
Iron: 97 ug/dL (ref 28–170)
Saturation Ratios: 40 % — ABNORMAL HIGH (ref 10.4–31.8)
TIBC: 244 ug/dL — ABNORMAL LOW (ref 250–450)
UIBC: 147 ug/dL

## 2017-12-30 LAB — FERRITIN: FERRITIN: 55 ng/mL (ref 11–307)

## 2018-01-24 ENCOUNTER — Ambulatory Visit: Payer: 59 | Admitting: Cardiovascular Disease

## 2018-01-24 ENCOUNTER — Encounter: Payer: Self-pay | Admitting: Cardiovascular Disease

## 2018-01-24 VITALS — BP 102/62 | HR 73 | Ht 63.0 in | Wt 162.0 lb

## 2018-01-24 DIAGNOSIS — K219 Gastro-esophageal reflux disease without esophagitis: Secondary | ICD-10-CM | POA: Diagnosis not present

## 2018-01-24 DIAGNOSIS — R002 Palpitations: Secondary | ICD-10-CM | POA: Diagnosis not present

## 2018-01-24 DIAGNOSIS — R0789 Other chest pain: Secondary | ICD-10-CM | POA: Diagnosis not present

## 2018-01-24 MED ORDER — METOPROLOL TARTRATE 25 MG PO TABS: 12.5000 mg | ORAL_TABLET | Freq: Two times a day (BID) | ORAL | 6 refills | Status: DC

## 2018-01-24 NOTE — Addendum Note (Signed)
Addended by: Laurine Blazer on: 01/24/2018 01:30 PM   Modules accepted: Orders

## 2018-01-24 NOTE — Progress Notes (Signed)
SUBJECTIVE: The patient returns for follow-up after undergoing cardiovascular testing performed for the evaluation of palpitations and chest pressure.  Nuclear stress test on 12/07/2017 showed defects with significant degree of soft tissue attenuation but a mild degree of septal ischemia could not entirely be ruled out.  While left ventricular systolic function appeared grossly normal, LVEF is calculated at 25%.  Echocardiogram however demonstrated normal left ventricular systolic and diastolic function and normal regional wall motion, LVEF 55 to 60%.  There was mild mitral and aortic regurgitation and mild left atrial dilatation.  She continues to experience episodic palpitations associated with chest pressure at times lasting seconds.  She sometimes feels "washed out "after these episodes.  I reviewed her event monitor which demonstrated sinus rhythm with PACs and atrial runs.    Family history: Her father died of an MI at age 55.  He developed coronary artery disease in his early 61s and underwent four-vessel CABG at age 25.  Soc Hx: She lives in [place] and moved from [place], [place], in 2018 with her husband to be close to her daughter who lives in [place] and had a baby.   Review of Systems: As per "subjective", otherwise negative.  Allergies  Allergen Reactions  . Lac Bovis Diarrhea  . Prednisone Diarrhea  . Sulfa Antibiotics Itching  . Zithromax [Azithromycin] Diarrhea    Current Outpatient Medications  Medication Sig Dispense Refill  . calcitRIOL (ROCALTROL) 0.5 MCG capsule Take 0.5 mcg by mouth daily.  5  . Cholecalciferol (VITAMIN D3) 5000 UNITS CAPS Take 1 capsule by mouth daily.     . diclofenac sodium (VOLTAREN) 1 % GEL Apply three grams to three large joints up to three times daily. 3 Tube 3  . moxifloxacin (AVELOX) 400 MG tablet Take 1 tablet (400 mg total) by mouth daily at 8 pm. 10 tablet 0  . Multiple Vitamin (MULTIVITAMIN) tablet Take 1  tablet by mouth 2 (two) times daily.     . pantoprazole (PROTONIX) 40 MG tablet Take 40 mg by mouth every morning.  3  . VITAMIN A PO Take 1 capsule by mouth daily.     Marland Kitchen VITAMIN E PO Take 1 capsule by mouth daily.     . Zoledronic Acid (RECLAST IV) Inject into the vein. YEARLY     No current facility-administered medications for this visit.     Past Medical History:  Diagnosis Date  . Asthma   . GERD (gastroesophageal reflux disease)   . Internal hemorrhoids   . Iron deficiency anemia   . Osteoporosis   . Peptic ulcer   . Tubular adenoma of colon     Past Surgical History:  Procedure Laterality Date  . BACK SURGERY    . BREAST SURGERY    . CHOLECYSTECTOMY    . COLONOSCOPY WITH PROPOFOL N/A 06/02/2017   Procedure: COLONOSCOPY WITH PROPOFOL;  Surgeon: Jonathon Bellows, MD;  Location: [facility] ENDOSCOPY;  Service: Gastroenterology;  Laterality: N/A;  . ESOPHAGOGASTRODUODENOSCOPY (EGD) WITH PROPOFOL N/A 07/01/2017   Procedure: ESOPHAGOGASTRODUODENOSCOPY (EGD) WITH PROPOFOL;  Surgeon: Jonathon Bellows, MD;  Location: [facility] ENDOSCOPY;  Service: Gastroenterology;  Laterality: N/A;  . FLEXIBLE SIGMOIDOSCOPY N/A 07/01/2017   Procedure: FLEXIBLE SIGMOIDOSCOPY;  Surgeon: Jonathon Bellows, MD;  Location: [facility] ENDOSCOPY;  Service: Gastroenterology;  Laterality: N/A;  . KNEE SURGERY    . mini gastric bypass    . SHOULDER SURGERY    . TONSILLECTOMY      Social History   Socioeconomic History  .  Marital status: Married    Spouse name: Not on file  . Number of children: Not on file  . Years of education: Not on file  . Highest education level: Not on file  Occupational History  . Not on file  Social Needs  . Financial resource strain: Not on file  . Food insecurity:    Worry: Not on file    Inability: Not on file  . Transportation needs:    Medical: Not on file    Non-medical: Not on file  Tobacco Use  . Smoking status: Never Smoker  . Smokeless tobacco: Never Used  Substance and Sexual Activity   . Alcohol use: No    Alcohol/week: 0.0 oz  . Drug use: No  . Sexual activity: Never  Lifestyle  . Physical activity:    Days per week: Not on file    Minutes per session: Not on file  . Stress: Not on file  Relationships  . Social connections:    Talks on phone: Not on file    Gets together: Not on file    Attends religious service: Not on file    Active member of club or organization: Not on file    Attends meetings of clubs or organizations: Not on file    Relationship status: Not on file  . Intimate partner violence:    Fear of current or ex partner: Not on file    Emotionally abused: Not on file    Physically abused: Not on file    Forced sexual activity: Not on file  Other Topics Concern  . Not on file  Social History Narrative  . Not on file     Vitals:   01/24/18 1306  BP: 102/62  Pulse: 73  SpO2: 97%  Weight: 162 lb (73.5 kg)  Height: 5\' 3"  (1.6 m)    Wt Readings from Last 3 Encounters:  01/24/18 162 lb (73.5 kg)  12/24/17 154 lb (69.9 kg)  11/29/17 154 lb (69.9 kg)     PHYSICAL EXAM General: NAD HEENT: Normal. Neck: No JVD, no thyromegaly. Lungs: Clear to auscultation bilaterally with normal respiratory effort. CV: Regular rate and rhythm, normal S1/S2, no S3/S4, no murmur. No pretibial or periankle edema.  No carotid bruit.   Abdomen: Soft, nontender, no distention.  Neurologic: Alert and oriented.  Psych: Normal affect. Skin: Normal. Musculoskeletal: No gross deformities.    ECG: Most recent ECG reviewed.   Labs: Lab Results  Component Value Date/Time   K 3.5 10/22/2017 10:56 AM   BUN 14 10/22/2017 10:56 AM   BUN 4 (L) 09/25/2016 03:18 PM   CREATININE 0.50 10/22/2017 10:56 AM   ALT 25 10/22/2017 10:56 AM   TSH 4.960 (H) 09/25/2016 03:18 PM   HGB 12.5 12/30/2017 12:02 PM   HGB 10.8 (L) 09/25/2016 03:18 PM     Lipids: No results found for: LDLCALC, LDLDIRECT, CHOL, TRIG, HDL     ASSESSMENT AND PLAN: 1.  Palpitations: Event  monitor results noted above with sinus rhythm predominantly with episodic atrial runs and PACs associated with her symptoms.  Her blood pressure is low normal.  I will start metoprolol tartrate 12.5 mg twice daily.  2.  Chest pressure with radiation to the neck: Nuclear stress test results noted above.  There is a significant degree of soft tissue attenuation artifact but a mild degree of septal ischemia could not entirely be ruled out.  Echocardiogram demonstrated normal left ventricular systolic and diastolic function and normal regional wall  motion.  I will continue to monitor her symptoms.  I am starting low-dose metoprolol for palpitations and will see if this helps to alleviate the symptoms.  3. GERD: Symptoms appear to be stable on Protonix 40 mg daily.      Disposition: Follow up 3 months   Kate Sable, M.D., F.A.C.C.

## 2018-01-24 NOTE — Patient Instructions (Signed)
Medication Instructions:   Begin Lopressor 12.5mg  twice a day.  Continue all other medications.    Labwork: none  Testing/Procedures: none  Follow-Up: 3 months   Any Other Special Instructions Will Be Listed Below (If Applicable).  If you need a refill on your cardiac medications before your next appointment, please call your pharmacy.

## 2018-02-11 ENCOUNTER — Telehealth: Payer: Self-pay | Admitting: *Deleted

## 2018-02-11 NOTE — Telephone Encounter (Signed)
Notes recorded by Laurine Blazer, LPN on 05/29/568 at 7:94 PM EDT Patient notified. Copy to pmd. Follow up scheduled for August with Dr. Bronson Ing.   ------  Notes recorded by Herminio Commons, MD on 02/11/2018 at 12:34 PM EDT  Sinus rhythm was the predominant rhythm with paroxysmal atrial runs seen, the longest of which lasted 8 beats.  Symptoms primarily correlated with atrial runs.  I will discuss further at ov.

## 2018-02-15 ENCOUNTER — Other Ambulatory Visit (HOSPITAL_COMMUNITY): Payer: Self-pay | Admitting: *Deleted

## 2018-02-15 DIAGNOSIS — D500 Iron deficiency anemia secondary to blood loss (chronic): Secondary | ICD-10-CM

## 2018-02-16 ENCOUNTER — Inpatient Hospital Stay (HOSPITAL_COMMUNITY): Payer: 59 | Attending: Hematology

## 2018-02-16 ENCOUNTER — Other Ambulatory Visit (HOSPITAL_COMMUNITY): Payer: 59

## 2018-02-16 DIAGNOSIS — Z9884 Bariatric surgery status: Secondary | ICD-10-CM | POA: Insufficient documentation

## 2018-02-16 DIAGNOSIS — D500 Iron deficiency anemia secondary to blood loss (chronic): Secondary | ICD-10-CM | POA: Insufficient documentation

## 2018-02-16 DIAGNOSIS — K909 Intestinal malabsorption, unspecified: Secondary | ICD-10-CM | POA: Diagnosis not present

## 2018-02-16 LAB — COMPREHENSIVE METABOLIC PANEL
ALT: 25 U/L (ref 14–54)
ANION GAP: 7 (ref 5–15)
AST: 31 U/L (ref 15–41)
Albumin: 3 g/dL — ABNORMAL LOW (ref 3.5–5.0)
Alkaline Phosphatase: 62 U/L (ref 38–126)
BILIRUBIN TOTAL: 0.6 mg/dL (ref 0.3–1.2)
BUN: 12 mg/dL (ref 6–20)
CALCIUM: 8 mg/dL — AB (ref 8.9–10.3)
CO2: 24 mmol/L (ref 22–32)
CREATININE: 0.57 mg/dL (ref 0.44–1.00)
Chloride: 111 mmol/L (ref 101–111)
Glucose, Bld: 80 mg/dL (ref 65–99)
Potassium: 3.4 mmol/L — ABNORMAL LOW (ref 3.5–5.1)
SODIUM: 142 mmol/L (ref 135–145)
TOTAL PROTEIN: 5.4 g/dL — AB (ref 6.5–8.1)

## 2018-02-16 LAB — CBC WITH DIFFERENTIAL/PLATELET
Basophils Absolute: 0 10*3/uL (ref 0.0–0.1)
Basophils Relative: 1 %
EOS PCT: 3 %
Eosinophils Absolute: 0.1 10*3/uL (ref 0.0–0.7)
HCT: 38.9 % (ref 36.0–46.0)
HEMOGLOBIN: 12.2 g/dL (ref 12.0–15.0)
LYMPHS ABS: 0.8 10*3/uL (ref 0.7–4.0)
LYMPHS PCT: 18 %
MCH: 31.1 pg (ref 26.0–34.0)
MCHC: 31.4 g/dL (ref 30.0–36.0)
MCV: 99.2 fL (ref 78.0–100.0)
MONOS PCT: 8 %
Monocytes Absolute: 0.3 10*3/uL (ref 0.1–1.0)
NEUTROS PCT: 70 %
Neutro Abs: 3 10*3/uL (ref 1.7–7.7)
Platelets: 173 10*3/uL (ref 150–400)
RBC: 3.92 MIL/uL (ref 3.87–5.11)
RDW: 13.8 % (ref 11.5–15.5)
WBC: 4.3 10*3/uL (ref 4.0–10.5)

## 2018-02-16 LAB — IRON AND TIBC
IRON: 75 ug/dL (ref 28–170)
Saturation Ratios: 26 % (ref 10.4–31.8)
TIBC: 287 ug/dL (ref 250–450)
UIBC: 212 ug/dL

## 2018-02-16 LAB — FERRITIN: FERRITIN: 16 ng/mL (ref 11–307)

## 2018-02-23 ENCOUNTER — Inpatient Hospital Stay (HOSPITAL_BASED_OUTPATIENT_CLINIC_OR_DEPARTMENT_OTHER): Payer: 59 | Admitting: Hematology

## 2018-02-23 ENCOUNTER — Encounter (HOSPITAL_COMMUNITY): Payer: Self-pay | Admitting: Hematology

## 2018-02-23 ENCOUNTER — Other Ambulatory Visit (HOSPITAL_COMMUNITY): Payer: 59

## 2018-02-23 VITALS — BP 110/61 | HR 64 | Temp 98.5°F | Resp 16 | Wt 163.2 lb

## 2018-02-23 DIAGNOSIS — Z9884 Bariatric surgery status: Secondary | ICD-10-CM

## 2018-02-23 DIAGNOSIS — D500 Iron deficiency anemia secondary to blood loss (chronic): Secondary | ICD-10-CM

## 2018-02-23 DIAGNOSIS — K909 Intestinal malabsorption, unspecified: Secondary | ICD-10-CM | POA: Diagnosis not present

## 2018-02-23 NOTE — Progress Notes (Signed)
[place] [place], [place] [address]   CLINIC:  Medical Oncology/Hematology  PCP:  [place] [id number] [phone]   REASON FOR VISIT:  Follow-up for iron deficiency state.  CURRENT THERAPY: Feraheme infusions.   INTERVAL HISTORY:  [name] 67 y.o. female returns for follow-up of iron deficiency state in the setting of gastric bypass surgery.  Her last Feraheme infusion was in October 2018.  She denies any bleeding per rectum or melena.  Complains of fatigue.  Shortness of breath on exertion was present.  Also has occasional numbness around the mouth.  No fevers, night sweats or weight loss reported.  No recent hospitalizations.  REVIEW OF SYSTEMS:  Review of Systems  Constitutional: Positive for fatigue.  Respiratory: Positive for shortness of breath.   Gastrointestinal: Positive for vomiting.  Neurological: Positive for numbness.  All other systems reviewed and are negative.    PAST MEDICAL/SURGICAL HISTORY:  Past Medical History:  Diagnosis Date  . Asthma   . GERD (gastroesophageal reflux disease)   . Internal hemorrhoids   . Iron deficiency anemia   . Osteoporosis   . Peptic ulcer   . Tubular adenoma of colon    Past Surgical History:  Procedure Laterality Date  . BACK SURGERY    . BREAST SURGERY    . CHOLECYSTECTOMY    . COLONOSCOPY WITH PROPOFOL N/A 06/02/2017   Procedure: COLONOSCOPY WITH PROPOFOL;  Surgeon: Jonathon Bellows, MD;  Location: [facility] ENDOSCOPY;  Service: Gastroenterology;  Laterality: N/A;  . ESOPHAGOGASTRODUODENOSCOPY (EGD) WITH PROPOFOL N/A 07/01/2017   Procedure: ESOPHAGOGASTRODUODENOSCOPY (EGD) WITH PROPOFOL;  Surgeon: Jonathon Bellows, MD;  Location: [facility] ENDOSCOPY;  Service: Gastroenterology;  Laterality: N/A;  . FLEXIBLE SIGMOIDOSCOPY N/A 07/01/2017   Procedure: FLEXIBLE SIGMOIDOSCOPY;  Surgeon: Jonathon Bellows, MD;  Location: [facility] ENDOSCOPY;  Service:  Gastroenterology;  Laterality: N/A;  . KNEE SURGERY    . mini gastric bypass    . SHOULDER SURGERY    . TONSILLECTOMY       SOCIAL HISTORY:  Social History   Socioeconomic History  . Marital status: Married    Spouse name: Not on file  . Number of children: Not on file  . Years of education: Not on file  . Highest education level: Not on file  Occupational History  . Not on file  Social Needs  . Financial resource strain: Not on file  . Food insecurity:    Worry: Not on file    Inability: Not on file  . Transportation needs:    Medical: Not on file    Non-medical: Not on file  Tobacco Use  . Smoking status: Never Smoker  . Smokeless tobacco: Never Used  Substance and Sexual Activity  . Alcohol use: No    Alcohol/week: 0.0 oz  . Drug use: No  . Sexual activity: Never  Lifestyle  . Physical activity:    Days per week: Not on file    Minutes per session: Not on file  . Stress: Not on file  Relationships  . Social connections:    Talks on phone: Not on file    Gets together: Not on file    Attends religious service: Not on file    Active member of club or organization: Not on file    Attends meetings of clubs or organizations: Not on file    Relationship status: Not on file  . Intimate partner violence:    Fear of  current or ex partner: Not on file    Emotionally abused: Not on file    Physically abused: Not on file    Forced sexual activity: Not on file  Other Topics Concern  . Not on file  Social History Narrative  . Not on file    FAMILY HISTORY:  Family History  Problem Relation Age of Onset  . Hypertension Other   . Stroke Other   . Diabetes Other   . Heart attack Other   . Obesity Other   . Diabetes Father   . Heart disease Father   . Colon cancer Father   . Breast cancer Paternal Grandmother   . Healthy Daughter   . Stomach cancer Neg Hx   . Pancreatic cancer Neg Hx     CURRENT MEDICATIONS:  Outpatient Encounter Medications as of  02/23/2018  Medication Sig  . calcitRIOL (ROCALTROL) 0.5 MCG capsule Take 0.5 mcg by mouth daily.  . Cholecalciferol (VITAMIN D3) 5000 UNITS CAPS Take 1 capsule by mouth daily.   . diclofenac sodium (VOLTAREN) 1 % GEL Apply three grams to three large joints up to three times daily.  . Melatonin 5 MG CAPS Take by mouth.  . metoprolol tartrate (LOPRESSOR) 25 MG tablet Take 0.5 tablets (12.5 mg total) by mouth 2 (two) times daily.  . Multiple Vitamin (MULTIVITAMIN) tablet Take 1 tablet by mouth 2 (two) times daily.   . pantoprazole (PROTONIX) 40 MG tablet Take 40 mg by mouth every morning.  Marland Kitchen VITAMIN A PO Take 1 capsule by mouth daily.   Marland Kitchen VITAMIN E PO Take 1 capsule by mouth daily.   . Zoledronic Acid (RECLAST IV) Inject into the vein. YEARLY  . [DISCONTINUED] moxifloxacin (AVELOX) 400 MG tablet Take 1 tablet (400 mg total) by mouth daily at 8 pm.   No facility-administered encounter medications on file as of 02/23/2018.     ALLERGIES:  Allergies  Allergen Reactions  . Lac Bovis Diarrhea  . Prednisone Diarrhea  . Sulfa Antibiotics Itching  . Zithromax [Azithromycin] Diarrhea     PHYSICAL EXAM:  ECOG Performance status: 1  Vitals:   02/23/18 1500  BP: 110/61  Pulse: 64  Resp: 16  Temp: 98.5 F (36.9 C)  SpO2: 98%   Filed Weights   02/23/18 1500  Weight: 163 lb 3 oz (74 kg)    Physical Exam Deferred.  LABORATORY DATA:  I have reviewed the labs as listed.  CBC    Component Value Date/Time   WBC 4.3 02/16/2018 0914   RBC 3.92 02/16/2018 0914   HGB 12.2 02/16/2018 0914   HGB 10.8 (L) 09/25/2016 1518   HCT 38.9 02/16/2018 0914   HCT 32.6 (L) 09/25/2016 1518   PLT 173 02/16/2018 0914   PLT 264 09/25/2016 1518   MCV 99.2 02/16/2018 0914   MCV 96 09/25/2016 1518   MCH 31.1 02/16/2018 0914   MCHC 31.4 02/16/2018 0914   RDW 13.8 02/16/2018 0914   RDW 15.5 (H) 09/25/2016 1518   LYMPHSABS 0.8 02/16/2018 0914   LYMPHSABS 1.2 09/25/2016 1518   MONOABS 0.3 02/16/2018  0914   EOSABS 0.1 02/16/2018 0914   EOSABS 0.1 09/25/2016 1518   BASOSABS 0.0 02/16/2018 0914   BASOSABS 0.0 09/25/2016 1518   CMP Latest Ref Rng & Units 02/16/2018 10/22/2017 07/28/2017  Glucose 65 - 99 mg/dL 80 88 97  BUN 6 - 20 mg/dL 12 14 17   Creatinine 0.44 - 1.00 mg/dL 0.57 0.50 0.61  Sodium 135 - 145  mmol/L 142 140 138  Potassium 3.5 - 5.1 mmol/L 3.4(L) 3.5 3.9  Chloride 101 - 111 mmol/L 111 108 106  CO2 22 - 32 mmol/L 24 23 27   Calcium 8.9 - 10.3 mg/dL 8.0(L) 8.8(L) 8.2(L)  Total Protein 6.5 - 8.1 g/dL 5.4(L) 5.4(L) 5.4(L)  Total Bilirubin 0.3 - 1.2 mg/dL 0.6 0.3 0.4  Alkaline Phos 38 - 126 U/L 62 79 71  AST 15 - 41 U/L 31 22 29   ALT 14 - 54 U/L 25 25 28        ASSESSMENT & PLAN:   Iron deficiency anemia due to chronic blood loss 1.  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 -She denies any overt bleeding per rectum or melena. -Her last Feraheme was in October 2018. -Her last colonoscopy was on 06/02/2017 at [facility] which showed small mouth diverticula in sigmoid colon with nonbleeding internal hemorrhoids. -Ferritin level has dropped to 16 from 55.  Hemoglobin is at 12.2.  I have recommended 2 infusions of Feraheme because of her severe tiredness.  She is planning to go to [facility] end of this month and would like to receive infusions prior to that.  We will check her back in 4 months for follow-up.  We will repeat B12, ferritin and iron panel at that time.      Orders placed this encounter:  Orders Placed This Encounter  Procedures  . Comprehensive metabolic panel  . CBC with Differential  . Vitamin B12  . Folate  . Iron and TIBC  . Ferritin      Derek Jack, MD [facility] [phone]

## 2018-02-23 NOTE — Assessment & Plan Note (Signed)
1.  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 -She denies any overt bleeding per rectum or melena. -Her last Feraheme was in October 2018. -Her last colonoscopy was on 06/02/2017 at [facility] which showed small mouth diverticula in sigmoid colon with nonbleeding internal hemorrhoids. -Ferritin level has dropped to 16 from 55.  Hemoglobin is at 12.2.  I have recommended 2 infusions of Feraheme because of her severe tiredness.  She is planning to go to [facility] end of this month and would like to receive infusions prior to that.  We will check her back in 4 months for follow-up.  We will repeat B12, ferritin and iron panel at that time.

## 2018-02-24 ENCOUNTER — Observation Stay (HOSPITAL_COMMUNITY): Payer: 59

## 2018-02-24 ENCOUNTER — Encounter (HOSPITAL_COMMUNITY): Payer: Self-pay | Admitting: Emergency Medicine

## 2018-02-24 ENCOUNTER — Other Ambulatory Visit: Payer: Self-pay

## 2018-02-24 ENCOUNTER — Observation Stay (HOSPITAL_COMMUNITY)
Admission: EM | Admit: 2018-02-24 | Discharge: 2018-02-25 | Disposition: A | Discharge status: Home / Self Care | Payer: 59 | Attending: Internal Medicine | Admitting: Internal Medicine

## 2018-02-24 ENCOUNTER — Emergency Department (HOSPITAL_COMMUNITY): Payer: 59

## 2018-02-24 DIAGNOSIS — M810 Age-related osteoporosis without current pathological fracture: Secondary | ICD-10-CM

## 2018-02-24 DIAGNOSIS — R2990 Unspecified symptoms and signs involving the nervous system: Secondary | ICD-10-CM | POA: Diagnosis not present

## 2018-02-24 DIAGNOSIS — Z79899 Other long term (current) drug therapy: Secondary | ICD-10-CM | POA: Insufficient documentation

## 2018-02-24 DIAGNOSIS — I10 Essential (primary) hypertension: Secondary | ICD-10-CM | POA: Diagnosis not present

## 2018-02-24 DIAGNOSIS — K219 Gastro-esophageal reflux disease without esophagitis: Secondary | ICD-10-CM | POA: Diagnosis not present

## 2018-02-24 DIAGNOSIS — R200 Anesthesia of skin: Secondary | ICD-10-CM | POA: Diagnosis present

## 2018-02-24 DIAGNOSIS — E559 Vitamin D deficiency, unspecified: Secondary | ICD-10-CM | POA: Insufficient documentation

## 2018-02-24 DIAGNOSIS — D500 Iron deficiency anemia secondary to blood loss (chronic): Secondary | ICD-10-CM | POA: Diagnosis present

## 2018-02-24 DIAGNOSIS — H1132 Conjunctival hemorrhage, left eye: Secondary | ICD-10-CM | POA: Insufficient documentation

## 2018-02-24 DIAGNOSIS — R531 Weakness: Secondary | ICD-10-CM | POA: Diagnosis not present

## 2018-02-24 DIAGNOSIS — D509 Iron deficiency anemia, unspecified: Secondary | ICD-10-CM | POA: Diagnosis not present

## 2018-02-24 LAB — CBC
HEMATOCRIT: 42.3 % (ref 36.0–46.0)
Hemoglobin: 13.3 g/dL (ref 12.0–15.0)
MCH: 31.1 pg (ref 26.0–34.0)
MCHC: 31.4 g/dL (ref 30.0–36.0)
MCV: 99.1 fL (ref 78.0–100.0)
PLATELETS: 202 10*3/uL (ref 150–400)
RBC: 4.27 MIL/uL (ref 3.87–5.11)
RDW: 13.2 % (ref 11.5–15.5)
WBC: 6.6 10*3/uL (ref 4.0–10.5)

## 2018-02-24 LAB — I-STAT TROPONIN, ED: TROPONIN I, POC: 0.01 ng/mL (ref 0.00–0.08)

## 2018-02-24 LAB — DIFFERENTIAL
BASOS ABS: 0 10*3/uL (ref 0.0–0.1)
BASOS PCT: 1 %
EOS ABS: 0.3 10*3/uL (ref 0.0–0.7)
Eosinophils Relative: 5 %
Lymphocytes Relative: 22 %
Lymphs Abs: 1.5 10*3/uL (ref 0.7–4.0)
MONOS PCT: 7 %
Monocytes Absolute: 0.5 10*3/uL (ref 0.1–1.0)
Neutro Abs: 4.3 10*3/uL (ref 1.7–7.7)
Neutrophils Relative %: 65 %

## 2018-02-24 LAB — I-STAT CHEM 8, ED
BUN: 14 mg/dL (ref 6–20)
Calcium, Ion: 1.17 mmol/L (ref 1.15–1.40)
Chloride: 108 mmol/L (ref 101–111)
Creatinine, Ser: 0.5 mg/dL (ref 0.44–1.00)
Glucose, Bld: 74 mg/dL (ref 65–99)
HEMATOCRIT: 41 % (ref 36.0–46.0)
HEMOGLOBIN: 13.9 g/dL (ref 12.0–15.0)
POTASSIUM: 3.8 mmol/L (ref 3.5–5.1)
Sodium: 143 mmol/L (ref 135–145)
TCO2: 22 mmol/L (ref 22–32)

## 2018-02-24 LAB — COMPREHENSIVE METABOLIC PANEL
ALT: 26 U/L (ref 14–54)
AST: 20 U/L (ref 15–41)
Albumin: 3.3 g/dL — ABNORMAL LOW (ref 3.5–5.0)
Alkaline Phosphatase: 76 U/L (ref 38–126)
Anion gap: 6 (ref 5–15)
BUN: 16 mg/dL (ref 6–20)
CHLORIDE: 111 mmol/L (ref 101–111)
CO2: 26 mmol/L (ref 22–32)
Calcium: 8.5 mg/dL — ABNORMAL LOW (ref 8.9–10.3)
Creatinine, Ser: 0.52 mg/dL (ref 0.44–1.00)
Glucose, Bld: 72 mg/dL (ref 65–99)
POTASSIUM: 3.7 mmol/L (ref 3.5–5.1)
SODIUM: 143 mmol/L (ref 135–145)
Total Bilirubin: 0.6 mg/dL (ref 0.3–1.2)
Total Protein: 5.8 g/dL — ABNORMAL LOW (ref 6.5–8.1)

## 2018-02-24 LAB — TSH: TSH: 3.903 u[IU]/mL (ref 0.350–4.500)

## 2018-02-24 LAB — PROTIME-INR
INR: 0.94
Prothrombin Time: 12.5 seconds (ref 11.4–15.2)

## 2018-02-24 LAB — VITAMIN B12: Vitamin B-12: 784 pg/mL (ref 180–914)

## 2018-02-24 LAB — APTT: APTT: 32 s (ref 24–36)

## 2018-02-24 MED ORDER — ASPIRIN 81 MG PO CHEW
324.0000 mg | CHEWABLE_TABLET | Freq: Once | ORAL | Status: DC
  Administered 2018-02-24: 324 mg via ORAL
  Filled 2018-02-24: qty 4

## 2018-02-24 MED ORDER — ALTEPLASE 100 MG IV SOLR
INTRAVENOUS | Status: AC
  Filled 2018-02-24: qty 100

## 2018-02-24 MED ORDER — SODIUM CHLORIDE 0.9 % IV SOLN
INTRAVENOUS | Status: DC
  Administered 2018-02-24: 19:00:00 via INTRAVENOUS

## 2018-02-24 MED ORDER — PANTOPRAZOLE SODIUM 40 MG PO TBEC
40.0000 mg | DELAYED_RELEASE_TABLET | Freq: Every morning | ORAL | Status: DC
  Administered 2018-02-25: 40 mg via ORAL
  Filled 2018-02-24: qty 1

## 2018-02-24 MED ORDER — ASPIRIN 300 MG RE SUPP: 300.0000 mg | Freq: Every day | RECTAL | Status: DC

## 2018-02-24 MED ORDER — CALCITRIOL 0.25 MCG PO CAPS
0.5000 ug | ORAL_CAPSULE | Freq: Every day | ORAL | Status: DC
  Administered 2018-02-25: 0.5 ug via ORAL
  Filled 2018-02-24 (×2): qty 2

## 2018-02-24 MED ORDER — VITAMIN D 1000 UNITS PO TABS
5000.0000 [IU] | ORAL_TABLET | Freq: Every day | ORAL | Status: DC
  Administered 2018-02-25: 5000 [IU] via ORAL
  Filled 2018-02-24: qty 5

## 2018-02-24 MED ORDER — STROKE: EARLY STAGES OF RECOVERY BOOK
Freq: Once | Status: AC
  Administered 2018-02-24: 20:00:00
  Filled 2018-02-24 (×2): qty 1

## 2018-02-24 MED ORDER — MORPHINE SULFATE (PF) 2 MG/ML IV SOLN
0.5000 mg | INTRAVENOUS | Status: DC | PRN
  Administered 2018-02-25 (×2): 0.5 mg via INTRAVENOUS
  Filled 2018-02-24 (×2): qty 1

## 2018-02-24 MED ORDER — SENNOSIDES-DOCUSATE SODIUM 8.6-50 MG PO TABS
1.0000 | ORAL_TABLET | Freq: Every evening | ORAL | Status: DC | PRN
  Filled 2018-02-24: qty 1

## 2018-02-24 MED ORDER — ASPIRIN 325 MG PO TABS
325.0000 mg | ORAL_TABLET | Freq: Every day | ORAL | Status: DC
  Administered 2018-02-25: 325 mg via ORAL
  Filled 2018-02-24: qty 1

## 2018-02-24 MED ORDER — ADULT MULTIVITAMIN W/MINERALS CH
1.0000 | ORAL_TABLET | Freq: Every day | ORAL | Status: DC
  Administered 2018-02-25: 1 via ORAL
  Filled 2018-02-24: qty 1

## 2018-02-24 MED ORDER — METOPROLOL TARTRATE 25 MG PO TABS
12.5000 mg | ORAL_TABLET | Freq: Two times a day (BID) | ORAL | Status: DC
  Administered 2018-02-24 – 2018-02-25 (×2): 12.5 mg via ORAL
  Filled 2018-02-24 (×2): qty 1

## 2018-02-24 MED ORDER — ACETAMINOPHEN 650 MG RE SUPP: 650.0000 mg | RECTAL | Status: DC | PRN

## 2018-02-24 MED ORDER — HEPARIN SODIUM (PORCINE) 5000 UNIT/ML IJ SOLN
5000.0000 [IU] | Freq: Three times a day (TID) | INTRAMUSCULAR | Status: DC
  Administered 2018-02-24 – 2018-02-25 (×2): 5000 [IU] via SUBCUTANEOUS
  Filled 2018-02-24 (×3): qty 1

## 2018-02-24 MED ORDER — ACETAMINOPHEN 160 MG/5ML PO SOLN: 650.0000 mg | ORAL | Status: DC | PRN

## 2018-02-24 MED ORDER — ACETAMINOPHEN 325 MG PO TABS
650.0000 mg | ORAL_TABLET | ORAL | Status: DC | PRN
  Administered 2018-02-24: 650 mg via ORAL
  Filled 2018-02-24 (×2): qty 2

## 2018-02-24 NOTE — Progress Notes (Signed)
CODE STROKE [place] IMAGES TO SOC. EXAM COMPLETED IN Epic, GR CALLED SPOKER WITH STACY./bbj

## 2018-02-24 NOTE — ED Provider Notes (Signed)
[facility] EMERGENCY DEPARTMENT Provider Note   CSN: [id number] Arrival date & time: 02/24/18  1237   An emergency department physician performed an initial assessment on this suspected stroke patient at 1240.  History   Chief Complaint Chief Complaint  Patient presents with  . Code Stroke    HPI [name] is a 67 y.o. female.  HPI 15 minutes prior to arrival at roughly 1220 this afternoon patient developed left-sided facial numbness, left upper and lower extremity numbness and left upper and lower extremity weakness.  Patient states she also noted hematoma to the left eye.  No visual changes or speech changes.  Patient is not on any blood thinners.  Recently started on metoprolol for palpitations by her cardiologist.  Patient does have a history of anemia and was admitted to the hospital late last year for gas or intestinal bleeding. Past Medical History:  Diagnosis Date  . Asthma   . GERD (gastroesophageal reflux disease)   . Internal hemorrhoids   . Iron deficiency anemia   . Osteoporosis   . Peptic ulcer   . Tubular adenoma of colon     Patient Active Problem List   Diagnosis Date Noted  . Stroke-like symptoms 02/24/2018  . Primary osteoarthritis of both hands 11/03/2017  . Dupuytren's contracture of left hand 11/03/2017  . DDD (degenerative disc disease), lumbar 11/03/2017  . History of gastric bypass 11/03/2017  . Lymphedema 09/17/2017  . History of vertebral fracture 09/17/2017  . History of osteoporosis 09/17/2017  . Iron deficiency anemia due to chronic blood loss 06/28/2017  . Lower GI bleed 05/31/2017  . Rectal mass 05/31/2017  . Asthma 05/31/2017  . GERD (gastroesophageal reflux disease) 05/31/2017  . Closed fracture of upper end of humerus 09/23/2016  . Tendinitis 09/23/2016  . Patulous eustachian tube of right ear 11/07/2015  . 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 Vitamin D deficiency 08/20/2014  . Secondary  hyperparathyroidism ([place]) 08/20/2014    Past Surgical History:  Procedure Laterality Date  . BACK SURGERY    . BREAST SURGERY    . CHOLECYSTECTOMY    . COLONOSCOPY WITH PROPOFOL N/A 06/02/2017   Procedure: COLONOSCOPY WITH PROPOFOL;  Surgeon: Jonathon Bellows, MD;  Location: [facility] ENDOSCOPY;  Service: Gastroenterology;  Laterality: N/A;  . ESOPHAGOGASTRODUODENOSCOPY (EGD) WITH PROPOFOL N/A 07/01/2017   Procedure: ESOPHAGOGASTRODUODENOSCOPY (EGD) WITH PROPOFOL;  Surgeon: Jonathon Bellows, MD;  Location: [facility] ENDOSCOPY;  Service: Gastroenterology;  Laterality: N/A;  . FLEXIBLE SIGMOIDOSCOPY N/A 07/01/2017   Procedure: FLEXIBLE SIGMOIDOSCOPY;  Surgeon: Jonathon Bellows, MD;  Location: [facility] ENDOSCOPY;  Service: Gastroenterology;  Laterality: N/A;  . KNEE SURGERY    . mini gastric bypass    . SHOULDER SURGERY    . TONSILLECTOMY       OB History    Gravida  2   Para  1   Term      Preterm      AB  1   Living        SAB  1   TAB      Ectopic      Multiple      Live Births               Home Medications    Prior to Admission medications   Medication Sig Start Date End Date Taking? Authorizing Provider  calcitRIOL (ROCALTROL) 0.5 MCG capsule Take 0.5 mcg by mouth daily. 08/30/16   [provider]  Cholecalciferol (VITAMIN D3) 5000 UNITS CAPS Take  1 capsule by mouth daily.     [provider]  diclofenac sodium (VOLTAREN) 1 % GEL Apply three grams to three large joints up to three times daily. 10/18/17   Ofilia Neas, PA-C  Melatonin 5 MG CAPS Take by mouth.    [provider]  metoprolol tartrate (LOPRESSOR) 25 MG tablet Take 0.5 tablets (12.5 mg total) by mouth 2 (two) times daily. 01/24/18 04/24/18  Herminio Commons, MD  Multiple Vitamin (MULTIVITAMIN) tablet Take 1 tablet by mouth 2 (two) times daily.     [provider]  pantoprazole (PROTONIX) 40 MG tablet Take 40 mg by mouth every morning. 08/17/16   [provider]  VITAMIN A PO  Take 1 capsule by mouth daily.     [provider]  VITAMIN E PO Take 1 capsule by mouth daily.     [provider]  Zoledronic Acid (RECLAST IV) Inject into the vein. YEARLY    [provider]    Family History Family History  Problem Relation Age of Onset  . Hypertension Other   . Stroke Other   . Diabetes Other   . Heart attack Other   . Obesity Other   . Diabetes Father   . Heart disease Father   . Colon cancer Father   . Breast cancer Paternal Grandmother   . Healthy Daughter   . Stomach cancer Neg Hx   . Pancreatic cancer Neg Hx     Social History Social History   Tobacco Use  . Smoking status: Never Smoker  . Smokeless tobacco: Never Used  Substance Use Topics  . Alcohol use: No    Alcohol/week: 0.0 oz  . Drug use: No     Allergies   Lac bovis; Prednisone; Sulfa antibiotics; and Zithromax [azithromycin]   Review of Systems Review of Systems  Constitutional: Negative for chills and fever.  HENT: Negative for facial swelling, sore throat and trouble swallowing.   Eyes: Positive for redness. Negative for pain and visual disturbance.  Respiratory: Negative for cough and shortness of breath.   Cardiovascular: Negative for chest pain.  Gastrointestinal: Negative for abdominal pain, diarrhea, nausea and vomiting.  Genitourinary: Negative for dysuria and flank pain.  Musculoskeletal: Negative for back pain, myalgias and neck pain.  Skin: Negative for rash and wound.  Neurological: Positive for weakness and numbness. Negative for dizziness, speech difficulty, light-headedness and headaches.  All other systems reviewed and are negative.    Physical Exam Updated Vital Signs BP 124/76   Pulse 71   Temp 97.7 F (36.5 C) (Oral)   Resp 19   Wt 71.4 kg (157 lb 4.8 oz)   SpO2 99%   BMI 27.86 kg/m   Physical Exam  Constitutional: She is oriented to person, place, and time. She appears well-developed and well-nourished.  HENT:    Head: Normocephalic and atraumatic.  Mouth/Throat: Oropharynx is clear and moist.  No definite facial asymmetry noted.  Decreased sensation to light touch of the left upper and left lower face.  No obvious trauma.  Eyes: Pupils are equal, round, and reactive to light. EOM are normal.  Left sub-conjunctival hematoma.  No evidence of hyphema.  Neck: Normal range of motion. Neck supple.  No meningismus.  No posterior midline cervical tenderness to palpation.  Cardiovascular: Normal rate and regular rhythm. Exam reveals no gallop and no friction rub.  No murmur heard. Pulmonary/Chest: Effort normal and breath sounds normal. No stridor. No respiratory distress. She has no wheezes.  She has no rales. She exhibits no tenderness.  Abdominal: Soft. Bowel sounds are normal. There is no tenderness. There is no rebound and no guarding.  Musculoskeletal: Normal range of motion. She exhibits no edema or tenderness.  Neurological: She is alert and oriented to person, place, and time.  4/5 left upper extremity motor.  2/5 left lower extremity motor.  5/5 right upper extremity and right lower extremity motor.  Decreased sensation to light touch in the left upper and left lower extremities.  Voice is clear.  Skin: Skin is warm and dry. Capillary refill takes less than 2 seconds. No rash noted. No erythema.  Psychiatric: She has a normal mood and affect. Her behavior is normal.  Nursing note and vitals reviewed.    ED Treatments / Results  Labs (all labs ordered are listed, but only abnormal results are displayed) Labs Reviewed  COMPREHENSIVE METABOLIC PANEL - Abnormal; Notable for the following components:      Result Value   Calcium 8.5 (*)    Total Protein 5.8 (*)    Albumin 3.3 (*)    All other components within normal limits  PROTIME-INR  APTT  CBC  DIFFERENTIAL  I-STAT TROPONIN, ED  CBG MONITORING, ED  I-STAT CHEM 8, ED    EKG EKG Interpretation  Date/Time:  Thursday February 24 2018  12:53:32 EDT Ventricular Rate:  68 PR Interval:    QRS Duration: 92 QT Interval:  415 QTC Calculation: 442 R Axis:   78 Text Interpretation:  Sinus rhythm Low voltage, precordial leads Confirmed by Julianne Rice [phone]) on 02/24/2018 1:29:01 PM   Radiology Ct Head Code Stroke Wo Contrast  Result Date: 02/24/2018 CLINICAL DATA:  Code stroke. Left-sided facial numbness and facial droop beginning 1220 hours. Left-sided body weakness. EXAM: CT HEAD WITHOUT CONTRAST TECHNIQUE: Contiguous axial images were obtained from the base of the skull through the vertex without intravenous contrast. COMPARISON:  None. FINDINGS: Brain: Normal appearance without evidence of atrophy, old or acute small or large vessel infarction mass lesion, hemorrhage hydrocephalus or extra-axial collection. Vascular: No abnormal vascular finding. Skull: Normal Sinuses/Orbits: Clear/normal Other: None ASPECTS ([place] Stroke Program Early CT Score) - Ganglionic level infarction (caudate, lentiform nuclei, internal capsule, insula, M1-M3 cortex): 7 - Supraganglionic infarction (M4-M6 cortex): 3 Total score (0-10 with 10 being normal): 10 IMPRESSION: 1. Normal head CT. 2. Aspects is 10. 3. These results were called by telephone at the time of interpretation on 02/24/2018 at 12:54 pm to Dr. Henry Russel ED assistant, who verbally acknowledged these results. Electronically Signed   By: Nelson Chimes M.D.   On: 02/24/2018 12:54    Procedures Procedures (including critical care time)  Medications Ordered in ED Medications  alteplase (ACTIVASE) 1 mg/mL injection (  Not Given 02/24/18 1333)  aspirin chewable tablet 324 mg (has no administration in time range)     Initial Impression / Assessment and Plan / ED Course  I have reviewed the triage vital signs and the nursing notes.  Pertinent labs & imaging results that were available during my care of the patient were reviewed by me and considered in my medical decision making  (see chart for details).     Code stroke activated.  CT head without acute findings.  Evaluated by tele-neurology in the outpatient with a candidate for tPA.  tPA was offered patient with the patient was concerned given her history of anemia and GI bleeding.  She refused tPA.  Will discuss with hospitalist regarding admission.  Aspirin  given. Hospitalist will see patient in emergency department and admit. Final Clinical Impressions(s) / ED Diagnoses   Final diagnoses:  Left-sided weakness    ED Discharge Orders    None       Julianne Rice, MD 02/24/18 1350

## 2018-02-24 NOTE — Consult Note (Signed)
  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 Cardioembolic stroke  2. Small vessel disease/lacune  3. Thromboembolic, artery-to-artery mechanism  4. Hypercoagulable state-related infarct  5. Transient ischemic attack  6. Thrombotic mechanism, large artery disease   Comments:   Last known well:1220 Door time:1237 [facility] contacted: 1224 [facility] at bedside: 1252 NIHSS assessment time: 1252  Recommendations:  asa if no contraindications inpatient neurology consultation Inpatient stroke evaluation as per Neurology/ Internal Medicine Discussed with ED MD  -----------------------------------------------------------------------------------------  CC SA  History of Present Illness  67yo W w hx of chronic anemia who receives infusions p/w acute onset left eye pain and facial droop and left sided numbness at 12:20.   Diagnostic: hct naf Exam: left conjunctival hemorrhage NIHSS score:0  Level of consciousness:0 LOC questions:0 LOC commands:0 Best Gaze:0 Visual:0 Facial palsy: 1 Motor arm - left:1 Motor arm - right:0 Motor leg - left:1 Motor leg - right:0 Limb ataxia:0 Sensory:1 Best language:0 Dysarthria:1 Extinction and inattention:0     Medical Decision Making:  - Extensive number of diagnosis or management options are considered above.   - Extensive amount of complex data reviewed.   - High risk of complication and/or morbidity or mortality are associated with differential diagnostic considerations above.  - There may be Uncertain outcome and increased probability of prolonged functional impairment or high probability of severe prolonged functional impairment associated with some of these differential  diagnosis.  Medical Data Reviewed:  1.Data reviewed include clinical labs, radiology,  Medical Tests;   2.Tests results discussed w/performing or interpreting physician;   3.Obtaining/reviewing old medical records;  4.Obtaining case history from another source;  5.Independent review of image, tracing or specimen.    Patient was informed the Neurology Consult would happen via TeleHealth consult by way of interactive audio and video telecommunications and consented to receiving care in this manner.

## 2018-02-24 NOTE — Plan of Care (Signed)
  Problem: Education: Goal: Knowledge of disease or condition will improve Outcome: Progressing Goal: Knowledge of secondary prevention will improve Outcome: Progressing   Problem: Nutrition: Goal: Risk of aspiration will decrease Outcome: Progressing

## 2018-02-24 NOTE — ED Notes (Signed)
Pt state she does want tpa treatment

## 2018-02-24 NOTE — ED Notes (Signed)
Teleneuro at bedside. Code stroke paged out. PT in CT

## 2018-02-24 NOTE — ED Notes (Signed)
Pt gone to MRI 

## 2018-02-24 NOTE — ED Triage Notes (Signed)
Patient c/o left side facial numbness with left eye drooping that started suddenly, approx 15 minutes prior to arrival to ED (1220). Patient also reports weakness of left side of body. No slurred speech or difficulty with speech noted. Patient assessed by Dr Lita Mains in route to room, Code stroke called at 31. Patient is being transported to CT.

## 2018-02-24 NOTE — H&P (Signed)
History and Physical    [name] JEH:[id number] DOB: 1950/10/12 DOA: 02/24/2018  Referring MD/NP/PA: Dr. Lita Mains  PCP: Mar Daring, PA-C  Patient coming from: Home  Chief Complaint: Left eye pain, left side numbness, left side Weakness.  HPI: [name] is a 67 y.o. female with past medical history significant for gastric bypass, gastroesophageal reflux disease, osteoporosis, history of asthma and chronic iron deficiency anemia (in the setting of chronic blood loss); who presented to the emergency department secondary to sudden onset of left side numbness, weakness and left eye pain.  Patient's pain was sudden in presentation, radiated to the back of head, throbbing and intermittent, no associated symptoms, no alleviating or aggravating factors.  Patient reported symptoms presented around midday (12:20 pm) and she immediately came to the emergency department for further evaluation.  During her examination she was found to have a mild left facial droop as well.  Patient was treated under code stroke protocol, CT scan of the head was negative for acute intracranial abnormality; tele-neurology was consulted and recommended TPA, but this was declined by the patient.  Symptoms slightly improved by time of my examination. Patient place in observation to complete work up for TIA Vs stroke and to have neurology service evaluation.   Patient denies fever, cough, chest pain, shortness of breath, abdominal pain, nausea, vomiting, hematuria, dysuria, melena, hematochezia, blurred vision/difficulty seen or any other acute complaints.  Past Medical/Surgical History: Past Medical History:  Diagnosis Date  . Asthma   . GERD (gastroesophageal reflux disease)   . Internal hemorrhoids   . Iron deficiency anemia   . Osteoporosis   . Peptic ulcer   . Tubular adenoma of colon     Past Surgical History:  Procedure Laterality Date  . BACK SURGERY    . BREAST SURGERY    . CHOLECYSTECTOMY     . COLONOSCOPY WITH PROPOFOL N/A 06/02/2017   Procedure: COLONOSCOPY WITH PROPOFOL;  Surgeon: Jonathon Bellows, MD;  Location: [facility] ENDOSCOPY;  Service: Gastroenterology;  Laterality: N/A;  . ESOPHAGOGASTRODUODENOSCOPY (EGD) WITH PROPOFOL N/A 07/01/2017   Procedure: ESOPHAGOGASTRODUODENOSCOPY (EGD) WITH PROPOFOL;  Surgeon: Jonathon Bellows, MD;  Location: [facility] ENDOSCOPY;  Service: Gastroenterology;  Laterality: N/A;  . FLEXIBLE SIGMOIDOSCOPY N/A 07/01/2017   Procedure: FLEXIBLE SIGMOIDOSCOPY;  Surgeon: Jonathon Bellows, MD;  Location: [facility] ENDOSCOPY;  Service: Gastroenterology;  Laterality: N/A;  . KNEE SURGERY    . mini gastric bypass    . SHOULDER SURGERY    . TONSILLECTOMY      Social History:  reports that she has never smoked. She has never used smokeless tobacco. She reports that she does not drink alcohol or use drugs.  Allergies: Allergies  Allergen Reactions  . Lac Bovis Diarrhea  . Prednisone Diarrhea  . Sulfa Antibiotics Itching  . Tape Dermatitis  . Zithromax [Azithromycin] Diarrhea    Family History:  Family History  Problem Relation Age of Onset  . Hypertension Other   . Stroke Other   . Diabetes Other   . Heart attack Other   . Obesity Other   . Diabetes Father   . Heart disease Father   . Colon cancer Father   . Breast cancer Paternal Grandmother   . Healthy Daughter   . Stomach cancer Neg Hx   . Pancreatic cancer Neg Hx     Prior to Admission medications   Medication Sig Start Date End Date Taking? Authorizing Provider  calcitRIOL (ROCALTROL) 0.5 MCG capsule Take 0.5 mcg by mouth daily. 08/30/16  Yes [provider]  Cholecalciferol (VITAMIN D3) 5000 UNITS CAPS Take 1 capsule by mouth daily.    Yes [provider]  Melatonin 5 MG CAPS Take by mouth.   Yes [provider]  metoprolol tartrate (LOPRESSOR) 25 MG tablet Take 0.5 tablets (12.5 mg total) by mouth 2 (two) times daily. 01/24/18 04/24/18 Yes Herminio Commons, MD  Multiple Vitamin  (MULTIVITAMIN) tablet Take 1 tablet by mouth 2 (two) times daily.    Yes [provider]  pantoprazole (PROTONIX) 40 MG tablet Take 40 mg by mouth every morning. 08/17/16  Yes [provider]  VITAMIN A PO Take 1 capsule by mouth daily.    Yes [provider]  VITAMIN E PO Take 1 capsule by mouth daily.    Yes [provider]  Zoledronic Acid (RECLAST IV) Inject into the vein. YEARLY   Yes [provider]  diclofenac sodium (VOLTAREN) 1 % GEL Apply three grams to three large joints up to three times daily. Patient not taking: Reported on 02/24/2018 10/18/17   Ofilia Neas, PA-C    Review of Systems:  Negative except as otherwise mentioned/specified HPI.  Physical Exam: Vitals:   02/24/18 1316 02/24/18 1330 02/24/18 1400 02/24/18 1632  BP: 134/79 124/76 124/70 138/81  Pulse: 75 71 71 73  Resp: 18 19 19 18   Temp: 97.7 F (36.5 C)     TempSrc: Oral     SpO2: 99% 99% 98% 97%  Weight:        Constitutional: NAD, calm, comfortable; afebrile, denies chest pain, no shortness of breath.  Reports still numbness/weakness on her left side and intermittent episode of left eye pain. Eyes: PERRL, lids and conjunctivae normal, left eye with positive redness/signs of capillary vessel rupture. Vision is preserved. ENMT: Mucous membranes are moist. Posterior pharynx clear of any exudate or lesions. No thrush   Neck: normal, supple, no masses, no thyromegaly, JVD Respiratory: clear to auscultation bilaterally, no wheezing, no crackles. Normal respiratory effort. No accessory muscle use.  Cardiovascular: Regular rate and rhythm, no murmurs / rubs / gallops. 2+ pedal pulses. No carotid bruits.  Abdomen: no tenderness, no masses palpated. No hepatosplenomegaly. Bowel sounds positive.  Musculoskeletal: no clubbing / cyanosis. Good ROM, no contractures. Normal muscle tone. Chronic LE swelling (1-2++).  Skin: no rashes, lesions, ulcers. No induration Neurologic: CN  2-12 grossly intact. DTR normal; LUE and LLE with reported decrease sensation to light tough; positive MS 3/5 on left side. No pronator drift. Right side with MS 5/5 Psychiatric: Normal judgment and insight. Alert and oriented x 3. Normal mood.    Labs on Admission: I have personally reviewed the following labs and imaging studies  CBC: Recent Labs  Lab 02/24/18 1256 02/24/18 1301  WBC 6.6  --   NEUTROABS 4.3  --   HGB 13.3 13.9  HCT 42.3 41.0  MCV 99.1  --   PLT 202  --    Basic Metabolic Panel: Recent Labs  Lab 02/24/18 1256 02/24/18 1301  NA 143 143  K 3.7 3.8  CL 111 108  CO2 26  --   GLUCOSE 72 74  BUN 16 14  CREATININE 0.52 0.50  CALCIUM 8.5*  --    GFR: Estimated Creatinine Clearance: 67.3 mL/min (by C-G formula based on SCr of 0.5 mg/dL).   Liver Function Tests: Recent Labs  Lab 02/24/18 1256  AST 20  ALT 26  ALKPHOS 76  BILITOT 0.6  PROT 5.8*  ALBUMIN 3.3*  Coagulation Profile: Recent Labs  Lab 02/24/18 1256  INR 0.94    Thyroid Function Tests: Recent Labs    02/24/18 1256  TSH 3.903   Urine analysis:    Component Value Date/Time   COLORURINE YELLOW (A) 06/17/2017 1402   APPEARANCEUR CLEAR (A) 06/17/2017 1402   LABSPEC 1.017 06/17/2017 1402   PHURINE 5.0 06/17/2017 1402   GLUCOSEU NEGATIVE 06/17/2017 1402   HGBUR NEGATIVE 06/17/2017 1402   BILIRUBINUR NEGATIVE 06/17/2017 1402   KETONESUR NEGATIVE 06/17/2017 1402   PROTEINUR NEGATIVE 06/17/2017 1402   NITRITE NEGATIVE 06/17/2017 1402   LEUKOCYTESUR NEGATIVE 06/17/2017 1402   Radiological Exams on Admission: Mr Brain Wo Contrast  Result Date: 02/24/2018 CLINICAL DATA:  67 year old female with left facial and left extremity numbness and weakness today beginning approximately 1220 hours. EXAM: MRI HEAD WITHOUT CONTRAST TECHNIQUE: Multiplanar, multiecho pulse sequences of the brain and surrounding structures were obtained without intravenous contrast. COMPARISON:  Head CT without  contrast 1242 hours. FINDINGS: Brain: No restricted diffusion to suggest acute infarction. No midline shift, mass effect, ventriculomegaly, extra-axial collection or acute intracranial hemorrhage. Cervicomedullary junction and pituitary are within normal limits. Pearline Cables and white matter signal is within normal limits for age throughout the brain. No cortical encephalomalacia or chronic cerebral blood products identified. The deep gray matter nuclei and brainstem appear normal. Within the posterior interhemispheric fissure there is evidence on several sequences of a small 3-4 millimeter probably extra-axial focus of gray matter isointense T1 but increased FLAIR hyperintensity and diffusion (series 5, image 50, series 9, image 36 and series 10, image 66. This is not evident on all sequences, nor is it correlated on the earlier CT. Vascular: Major intracranial vascular flow voids are preserved, the distal right vertebral artery appears dominant. Skull and upper cervical spine: Partially visible C3-C4 disc and endplate degeneration with evidence of mild degenerative spinal stenosis. Associated degenerative endplate marrow signal changes at those levels. Other visualized bone marrow signal is within normal limits. Sinuses/Orbits: Postoperative changes to the globes, otherwise normal orbits soft tissues. Paranasal sinuses and mastoids are stable and well pneumatized. Other: Visible internal auditory structures appear normal. Scalp and face soft tissues appear negative. IMPRESSION: 1.  No acute intracranial abnormality. 2. 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 This appears inconsequential. 3. Otherwise normal for age noncontrast MRI appearance of the brain. 4. Partially visible C3-C4 cervical spine degeneration with suspected mild spinal stenosis. Electronically Signed   By: Genevie Ann M.D.   On: 02/24/2018 15:54   US Carotid Bilateral (at [facility] And [facility] Only)  Result Date: 02/24/2018 CLINICAL DATA:  Left-sided  paresthesias, visual disturbance EXAM: BILATERAL CAROTID DUPLEX ULTRASOUND TECHNIQUE: Pearline Cables scale imaging, color Doppler and duplex ultrasound were performed of bilateral carotid and vertebral arteries in the neck. COMPARISON:  02/24/2018 head CT without contrast FINDINGS: Criteria: Quantification of carotid stenosis is based on velocity parameters that correlate the residual internal carotid diameter with NASCET-based stenosis levels, using the diameter of the distal internal carotid lumen as the denominator for stenosis measurement. The following velocity measurements were obtained: RIGHT ICA: 85/35 cm/sec CCA: 96/75 cm/sec SYSTOLIC ICA/CCA RATIO:  0.9 ECA:  88 cm/sec LEFT ICA: 92/28 cm/sec CCA: 91/63 cm/sec SYSTOLIC ICA/CCA RATIO:  1.7 ECA:  81 cm/sec RIGHT CAROTID ARTERY: Mild carotid intimal thickening and early atherosclerosis. No hemodynamically significant right ICA stenosis, velocity elevation, or turbulent flow. Degree of narrowing less than 50%. RIGHT VERTEBRAL ARTERY:  Antegrade LEFT CAROTID ARTERY: Similar mild carotid intimal thickening and early atherosclerosis.  No hemodynamically significant left ICA stenosis, velocity elevation, or turbulent flow. LEFT VERTEBRAL ARTERY:  Antegrade IMPRESSION: Minor carotid atherosclerosis. No hemodynamically significant ICA stenosis. Degree of narrowing less than 50% bilaterally by ultrasound criteria. Patent antegrade vertebral flow bilaterally Electronically Signed   By: Jerilynn Mages.  Shick M.D.   On: 02/24/2018 15:16   Mr Jodene Nam Head/brain WF Cm  Result Date: 02/24/2018 CLINICAL DATA:  67 year old female with left facial and left extremity numbness and weakness today beginning approximately 1220 hours. EXAM: MRA HEAD WITHOUT CONTRAST TECHNIQUE: Angiographic images of the Circle of Willis were obtained using MRA technique without intravenous contrast. COMPARISON:  Brain MRI today reported separately. Carotid Doppler ultrasound today. FINDINGS: Antegrade flow in the  posterior circulation with dominant distal right vertebral artery. Patent PICA origins. No distal vertebral stenosis. Patent vertebrobasilar junction. Patent basilar artery without stenosis. AICA and SCA origins are patent. Normal PCA origins. Posterior communicating arteries are diminutive or absent. Bilateral PCA branches are within normal limits. Antegrade flow in both ICA siphons. Tortuous cervical right ICA just below the skull base. The right siphon appears dominant. No siphon stenosis. Normal ophthalmic artery origins. Patent carotid termini. The right ACA A1 segment is dominant while the left is diminutive or absent. The anterior communicating artery and visible ACA branches are within normal limits. The left MCA M1 segment, left MCA bifurcation, and visible left MCA branches are within normal limits. The right MCA M1 segment, right MCA trifurcation, and visible right MCA branches are within normal limits. IMPRESSION: Negative intracranial MRA. Electronically Signed   By: Genevie Ann M.D.   On: 02/24/2018 15:58   Ct Head Code Stroke Wo Contrast  Result Date: 02/24/2018 CLINICAL DATA:  Code stroke. Left-sided facial numbness and facial droop beginning 1220 hours. Left-sided body weakness. EXAM: CT HEAD WITHOUT CONTRAST TECHNIQUE: Contiguous axial images were obtained from the base of the skull through the vertex without intravenous contrast. COMPARISON:  None. FINDINGS: Brain: Normal appearance without evidence of atrophy, old or acute small or large vessel infarction mass lesion, hemorrhage hydrocephalus or extra-axial collection. Vascular: No abnormal vascular finding. Skull: Normal Sinuses/Orbits: Clear/normal Other: None ASPECTS ([place] Stroke Program Early CT Score) - Ganglionic level infarction (caudate, lentiform nuclei, internal capsule, insula, M1-M3 cortex): 7 - Supraganglionic infarction (M4-M6 cortex): 3 Total score (0-10 with 10 being normal): 10 IMPRESSION: 1. Normal head CT. 2. Aspects is 10.  3. These results were called by telephone at the time of interpretation on 02/24/2018 at 12:54 pm to Dr. Henry Russel ED assistant, who verbally acknowledged these results. Electronically Signed   By: Nelson Chimes M.D.   On: 02/24/2018 12:54    EKG: Independently reviewed.  Normal rhythm, normal axis, no acute ischemic changes appreciated.  Assessment/Plan 1-Stroke-like symptoms: left side numbness and weakness; also with transient left facial droop (no present now). -patient passed swallowing test in ED -place in observation complete stroke work up -CT head and MRI/MRA negative -Carotid US: mild carotid atherosclerosis (< 50% bilaterally). Patent antegrade flow. -recent echo done and no major abnormalities appreciated; will no repeat. -observe on telemetry -will use ASA for secondary prevention  -follow PT/OT evaluation and neurology service inputs -will check TSH, B12, A1C and lipid panel   2-GERD (gastroesophageal reflux disease) -continue PPI  3-Iron deficiency anemia due to chronic blood loss -Hgb stable -no signs of acute blood loss -continue niferex daily -patient actively follow by hematology/oncology service as an outpatient   4-HTN (hypertension) -stable  -will be permissive of HTN during acute  ischemic work up -continue home antihypertensive regimen   5-left eye with capillary rupture -vision not affected -will follow progression   6-chronic Vit D deficiency/osteoporosis  -continue calcitriol and cholecalciferol -also continue yearly Reclast injection    DVT prophylaxis: heparin   Code Status: Full  Family Communication: Husband at bedside   Disposition Plan: to be determined, hopefully discharge home once work up completed. Consults called: neurology  Admission status: Observation, telemetry, LOS < 2 midnights.    Time Spent: 70 minutes  Barton Dubois MD [facility] Pager [phone]  If 7PM-7AM, please contact  night-coverage [email or web address] Password [facility]  02/24/2018, 5:46 PM

## 2018-02-24 NOTE — ED Notes (Addendum)
After discussing with husband pt and pt's family decided not to use tpa

## 2018-02-25 ENCOUNTER — Observation Stay (HOSPITAL_COMMUNITY): Payer: 59

## 2018-02-25 DIAGNOSIS — K219 Gastro-esophageal reflux disease without esophagitis: Secondary | ICD-10-CM | POA: Diagnosis not present

## 2018-02-25 DIAGNOSIS — R2990 Unspecified symptoms and signs involving the nervous system: Secondary | ICD-10-CM | POA: Diagnosis not present

## 2018-02-25 DIAGNOSIS — R531 Weakness: Secondary | ICD-10-CM

## 2018-02-25 DIAGNOSIS — I10 Essential (primary) hypertension: Secondary | ICD-10-CM | POA: Diagnosis not present

## 2018-02-25 LAB — BASIC METABOLIC PANEL
ANION GAP: 6 (ref 5–15)
BUN: 11 mg/dL (ref 6–20)
CALCIUM: 7.7 mg/dL — AB (ref 8.9–10.3)
CHLORIDE: 109 mmol/L (ref 101–111)
CO2: 28 mmol/L (ref 22–32)
Creatinine, Ser: 0.43 mg/dL — ABNORMAL LOW (ref 0.44–1.00)
GFR calc non Af Amer: >60 mL/min (ref >60)
Glucose, Bld: 84 mg/dL (ref 65–99)
POTASSIUM: 3.9 mmol/L (ref 3.5–5.1)
Sodium: 143 mmol/L (ref 135–145)

## 2018-02-25 LAB — LIPID PANEL
CHOLESTEROL: 126 mg/dL (ref 0–200)
HDL: 58 mg/dL (ref >40)
LDL Cholesterol: 61 mg/dL (ref 0–99)
Total CHOL/HDL Ratio: 2.2 RATIO
Triglycerides: 35 mg/dL (ref <150)
VLDL: 7 mg/dL (ref 0–40)

## 2018-02-25 LAB — CBC
HCT: 38 % (ref 36.0–46.0)
Hemoglobin: 12.3 g/dL (ref 12.0–15.0)
MCH: 32.1 pg (ref 26.0–34.0)
MCHC: 32.4 g/dL (ref 30.0–36.0)
MCV: 99.2 fL (ref 78.0–100.0)
PLATELETS: 174 10*3/uL (ref 150–400)
RBC: 3.83 MIL/uL — AB (ref 3.87–5.11)
RDW: 13.2 % (ref 11.5–15.5)
WBC: 3.8 10*3/uL — ABNORMAL LOW (ref 4.0–10.5)

## 2018-02-25 LAB — HEMOGLOBIN A1C
HEMOGLOBIN A1C: 4.1 % — AB (ref 4.8–5.6)
MEAN PLASMA GLUCOSE: 70.97 mg/dL

## 2018-02-25 MED ORDER — ASPIRIN EC 81 MG PO TBEC: 81.0000 mg | DELAYED_RELEASE_TABLET | Freq: Every day | ORAL | 3 refills | Status: DC

## 2018-02-25 MED ORDER — GADOBENATE DIMEGLUMINE 529 MG/ML IV SOLN
14.0000 mL | Freq: Once | INTRAVENOUS | Status: AC | PRN
  Administered 2018-02-25: 14 mL via INTRAVENOUS

## 2018-02-25 MED ORDER — HYDROCODONE-ACETAMINOPHEN 5-300 MG PO TABS: 1.0000 | ORAL_TABLET | Freq: Four times a day (QID) | ORAL | 0 refills | Status: AC | PRN

## 2018-02-25 NOTE — Evaluation (Signed)
Physical Therapy Evaluation Patient Details Name: [name] MRN: [id number] DOB: [birth year] Today's Date: 02/25/2018   History of Present Illness  [name] is a 67 y.o. female with past medical history significant for gastric bypass, gastroesophageal reflux disease, osteoporosis, history of asthma and chronic iron deficiency anemia (in the setting of chronic blood loss); who presented to the emergency department secondary to sudden onset of left side numbness, weakness and left eye pain.  Patient's pain was sudden in presentation, radiated to the back of head, throbbing and intermittent, no associated symptoms, no alleviating or aggravating factors.  Patient reported symptoms presented around midday (12:20 pm) and she immediately came to the emergency department for further evaluation.  During her examination she was found to have a mild left facial droop as well.  Patient was treated under code stroke protocol, CT scan of the head was negative for acute intracranial abnormality; tele-neurology was consulted and recommended TPA, but this was declined by the patient.    Clinical Impression  Patient functioning at baseline for functional mobility and gait, other than mild weakness on left side, able to ambulate in hallway without loss of balance, drifts to the right without use of AD which is baseline for patient and states that is why she uses a cane.  Plan:  Patient discharged from physical therapy to care of nursing for ambulation daily as tolerated for length of stay.    Follow Up Recommendations No PT follow up    Equipment Recommendations  None recommended by PT    Recommendations for Other Services       Precautions / Restrictions Precautions Precautions: None Restrictions Weight Bearing Restrictions: No      Mobility  Bed Mobility Overal bed mobility: Modified Independent                Transfers Overall transfer level: Modified independent Equipment used:  None                Ambulation/Gait Ambulation/Gait assistance: Supervision Gait Distance (Feet): 150 Feet Assistive device: None;Straight cane Gait Pattern/deviations: Decreased step length - right;Decreased step length - left;Decreased stance time - left;Decreased stride length;Drifts right/left Gait velocity: decreased   General Gait Details: demonstrates slightly labored slow cadence with decreased armswing, tendency to drift to the right which is baseline for patient, improved balance using SPC with mostly 2 point gait pattern with less drifting to the right  Stairs            Wheelchair Mobility    Modified Rankin (Stroke Patients Only)       Balance Overall balance assessment: Mild deficits observed, not formally tested                                           Pertinent Vitals/Pain Pain Assessment: 0-10 Pain Score: 6  Pain Location: left eye Pain Descriptors / Indicators: Discomfort;Sharp Pain Intervention(s): Limited activity within patient's tolerance;Monitored during session    Home Living Family/patient expects to be discharged to:: Private residence Living Arrangements: Spouse/significant other Available Help at Discharge: Family;Available 24 hours/day Type of Home: House Home Access: Stairs to enter Entrance Stairs-Rails: Right Entrance Stairs-Number of Steps: 4 Home Layout: One level Home Equipment: Cane - single point;Grab bars - tub/shower      Prior Function Level of Independence: Independent with assistive device(s)         Comments:  household distances without AD, uses SPC for community     Hand Dominance   Dominant Hand: Right    Extremity/Trunk Assessment   Upper Extremity Assessment Upper Extremity Assessment: Defer to OT evaluation    Lower Extremity Assessment Lower Extremity Assessment: Overall WFL for tasks assessed;LLE deficits/detail LLE Deficits / Details: grossly -4/5    Cervical / Trunk  Assessment Cervical / Trunk Assessment: Normal  Communication   Communication: No difficulties  Cognition Arousal/Alertness: Awake/alert Behavior During Therapy: WFL for tasks assessed/performed Overall Cognitive Status: Within Functional Limits for tasks assessed                                        General Comments      Exercises     Assessment/Plan    PT Assessment Patent does not need any further PT services  PT Problem List         PT Treatment Interventions      PT Goals (Current goals can be found in the Care Plan section)  Acute Rehab PT Goals Patient Stated Goal: return home with spouse to assist PT Goal Formulation: With patient/family Time For Goal Achievement: 2018/02/26 Potential to Achieve Goals: Good    Frequency     Barriers to discharge        Co-evaluation               AM-PAC PT "6 Clicks" Daily Activity  Outcome Measure Difficulty turning over in bed (including adjusting bedclothes, sheets and blankets)?: None Difficulty moving from lying on back to sitting on the side of the bed? : None Difficulty sitting down on and standing up from a chair with arms (e.g., wheelchair, bedside commode, etc,.)?: None Help needed moving to and from a bed to chair (including a wheelchair)?: None Help needed walking in hospital room?: None Help needed climbing 3-5 steps with a railing? : A Little 6 Click Score: 23    End of Session   Activity Tolerance: Patient tolerated treatment well Patient left: in chair;with call bell/phone within reach Nurse Communication: Mobility status PT Visit Diagnosis: Unsteadiness on feet (R26.81);Other abnormalities of gait and mobility (R26.89);Muscle weakness (generalized) (M62.81)    Time: 3875-6433 PT Time Calculation (min) (ACUTE ONLY): 22 min   Charges:   PT Evaluation $PT Eval Moderate Complexity: 1 Mod PT Treatments $Therapeutic Activity: 8-22 mins   PT G Codes:        12:27 PM,  02-26-18 Lonell Grandchild, MPT Physical Therapist with [facility] 336 [phone] office [phone] mobile phone

## 2018-02-25 NOTE — Evaluation (Signed)
Occupational Therapy Evaluation Patient Details Name: [name] MRN: [id number] DOB: April 11, 1951 Today's Date: 02/25/2018    History of Present Illness [name] is a 67 y.o. female with past medical history significant for gastric bypass, gastroesophageal reflux disease, osteoporosis, history of asthma and chronic iron deficiency anemia (in the setting of chronic blood loss); who presented to the emergency department secondary to sudden onset of left side numbness, weakness and left eye pain.  Patient's pain was sudden in presentation, radiated to the back of head, throbbing and intermittent, no associated symptoms, no alleviating or aggravating factors.  Patient reported symptoms presented around midday (12:20 pm) and she immediately came to the emergency department for further evaluation.  During her examination she was found to have a mild left facial droop as well.  Patient was treated under code stroke protocol, CT scan of the head was negative for acute intracranial abnormality; tele-neurology was consulted and recommended TPA, but this was declined by the patient.   Clinical Impression   Pt received supine in bed, agreeable to OT evaluation, husband present for portion of eval. Pt reports symptoms have resolved with exception of eye redness and pain. Pt demonstrates BUE strength WFL at 4/5, coordination and sensation are intact. Pt with hx of back surgery limiting mobility at times. Pt is performing ADLs at baseline independence, reports feeling slightly fatigued. No further OT services required at this time.     Follow Up Recommendations  No OT follow up;Supervision - Intermittent    Equipment Recommendations  Tub/shower seat       Precautions / Restrictions Precautions Precautions: None Restrictions Weight Bearing Restrictions: No      Mobility Bed Mobility Overal bed mobility: Modified Independent                Transfers Overall transfer level: Modified  independent Equipment used: Straight cane                      ADL either performed or assessed with clinical judgement   ADL Overall ADL's : Modified independent Eating/Feeding: Modified independent;Sitting   Grooming: Modified independent;Standing               Lower Body Dressing: Modified independent;Sitting/lateral leans               Functional mobility during ADLs: Supervision/safety;Cane       Vision Baseline Vision/History: No visual deficits Patient Visual Report: Other (comment)(left eye very red and painful) Vision Assessment?: No apparent visual deficits Additional Comments: Pt reporting vision has improved however left eye continues to be painful. All visual assessment tasks WNL            Pertinent Vitals/Pain Pain Assessment: 0-10 Pain Score: 5 (10 with sharp pains) Pain Location: left eye Pain Descriptors / Indicators: Discomfort;Sharp Pain Intervention(s): Limited activity within patient's tolerance     Hand Dominance Right   Extremity/Trunk Assessment Upper Extremity Assessment Upper Extremity Assessment: Overall WFL for tasks assessed   Lower Extremity Assessment Lower Extremity Assessment: Defer to PT evaluation   Cervical / Trunk Assessment Cervical / Trunk Assessment: Normal   Communication Communication Communication: No difficulties   Cognition Arousal/Alertness: Awake/alert Behavior During Therapy: WFL for tasks assessed/performed Overall Cognitive Status: Within Functional Limits for tasks assessed  Home Living Family/patient expects to be discharged to:: Private residence Living Arrangements: Spouse/significant other Available Help at Discharge: Family;Available 24 hours/day Type of Home: House Home Access: Stairs to enter [employer] of Steps: 4 Entrance Stairs-Rails: Right Home Layout: One level     Bathroom Shower/Tub:  Teacher, early years/pre: Standard     Home Equipment: Cane - single point;Grab bars - tub/shower          Prior Functioning/Environment Level of Independence: Independent with assistive device(s)                  AM-PAC PT "6 Clicks" Daily Activity     Outcome Measure Help from another person eating meals?: None Help from another person taking care of personal grooming?: None Help from another person toileting, which includes using toliet, bedpan, or urinal?: None Help from another person bathing (including washing, rinsing, drying)?: None Help from another person to put on and taking off regular upper body clothing?: None Help from another person to put on and taking off regular lower body clothing?: None 6 Click Score: 24   End of Session Equipment Utilized During Treatment: Gait belt;Other (comment)(SPC)  Activity Tolerance: Patient tolerated treatment well Patient left: in chair;with call bell/phone within reach;with family/visitor present;Other (comment)(MD in room)  OT Visit Diagnosis: Muscle weakness (generalized) (M62.81)                Time: 2094-7096 OT Time Calculation (min): 30 min Charges:  OT General Charges $OT Visit: 1 Visit OT Evaluation $OT Eval Low Complexity: 1 Low    Guadelupe Sabin, OTR/L  [phone] 02/25/2018, 8:16 AM

## 2018-02-25 NOTE — Discharge Summary (Signed)
Physician Discharge Summary  [name] WUJ:[id number] DOB: [birth year] DOA: 02/24/2018  PCP: Mar Daring, PA-C  Admit date: 02/24/2018 Discharge date: 02/25/2018  Time spent: 35 minutes  Recommendations for Outpatient Follow-up:  1. Repeat CBC to follow Hgb trend 2. Arrange outpatient follow up with ophthalmology service    Discharge Diagnoses:  Principal Problem:   Stroke-like symptoms Active Problems:   GERD (gastroesophageal reflux disease)   Iron deficiency anemia due to chronic blood loss   HTN (hypertension)   Left-sided weakness   Discharge Condition: stable and improved. Discharge home with instructions to follow up with PCP and ophthalmology service as an outpatient.   Diet recommendation: heart healthy diet   Filed Weights   02/24/18 1251  Weight: 71.4 kg (157 lb 4.8 oz)    History of present illness:  67 y.o. female with past medical history significant for gastric bypass, gastroesophageal reflux disease, osteoporosis, history of asthma and chronic iron deficiency anemia (in the setting of chronic blood loss); who presented to the emergency department secondary to sudden onset of left side numbness, weakness and left eye pain.  Patient's pain was sudden in presentation, radiated to the back of head, throbbing and intermittent, no associated symptoms, no alleviating or aggravating factors.  Patient reported symptoms presented around midday (12:20 pm) and she immediately came to the emergency department for further evaluation.  During her examination she was found to have a mild left facial droop as well.  Patient was treated under code stroke protocol, CT scan of the head was negative for acute intracranial abnormality; tele-neurology was consulted and recommended TPA, but this was declined by the patient.  Symptoms slightly improved by time of my examination. Patient place in observation to complete work up for TIA Vs stroke and to have neurology service  evaluation.   Patient denies fever, cough, chest pain, shortness of breath, abdominal pain, nausea, vomiting, hematuria, dysuria, melena, hematochezia, blurred vision/difficulty seen or any other acute complaints.   Hospital Course:  1-Stroke-like symptoms: left side numbness and weakness; also with transient left facial droop (no present now). -CT head and MRI/MRA negative -Carotid US: mild carotid atherosclerosis (< 50% bilaterally). Patent antegrade flow. -recent echo done and no major abnormalities appreciated; no repeated.  -no abnormalities on telemetry  -discharge on ASA 81 mg daily for secondary prevention.  -following PT/OT evaluation no needs at discharge.  -normal TSH, B12, A1C and lipid panel   2-GERD (gastroesophageal reflux disease) -continue PPI  3-Iron deficiency anemia due to chronic blood loss -Hgb stable -no signs of acute blood loss -continue daily iron supplementation and intermittent IV iron infusions.  -patient actively follow by hematology/oncology service as an outpatient   4-HTN (hypertension) -stable  -continue home antihypertensive regimen  -heart healthy diet recommended   5-left eye with capillary rupture -vision not affected -orbital MRI neg -recommending outpatient follow up with an ophthalmologist   6-chronic Vit D deficiency/osteoporosis  -continue calcitriol and cholecalciferol -also continue yearly Reclast injection    Procedures:  See below for x-ray reports.  Consultations:  Neurology  Discharge Exam: Vitals:   02/25/18 0833 02/25/18 1357  BP: 136/67 122/69  Pulse: 70 60  Resp: 18 18  Temp: 98.3 F (36.8 C) 97.9 F (36.6 C)  SpO2: 97% 100%    General: Feeling better, still with some numbness and mild weakness on the left side but significantly improve in comparison to 02/24/2018. Cardiovascular: S1 and S2, no rubs, no gallops, no JVD. Respiratory: Good air movement bilaterally,  no wheezing, no crackles. Abdomen:  Soft, nontender, nondistended, positive bowel sounds. Neurologic exam: Cranial nerves grossly intact, no pronation, MS 5/5 right side and 4/5 left side.  Discharge Instructions   Discharge Instructions    Diet - low sodium heart healthy   Complete by:  As directed    Discharge instructions   Complete by:  As directed    Take medications as prescribed Remember to use baby aspirin (enteric-coated) on daily basis. Follow-up with PCP in 10 days Follow-up with ophthalmologist as an outpatient.     Allergies as of 02/25/2018      Reactions   Lac Bovis Diarrhea   Prednisone Diarrhea   Sulfa Antibiotics Itching   Tape Dermatitis   Zithromax [azithromycin] Diarrhea      Medication List    TAKE these medications   aspirin EC 81 MG tablet Take 1 tablet (81 mg total) by mouth daily.   calcitRIOL 0.5 MCG capsule Commonly known as:  ROCALTROL Take 0.5 mcg by mouth daily.   diclofenac sodium 1 % Gel Commonly known as:  VOLTAREN Apply three grams to three large joints up to three times daily.   HYDROcodone-Acetaminophen 5-300 MG Tabs Commonly known as:  VICODIN Take 1 tablet by mouth every 6 (six) hours as needed for up to 5 days (severe pain).   Melatonin 5 MG Caps Take by mouth.   metoprolol tartrate 25 MG tablet Commonly known as:  LOPRESSOR Take 0.5 tablets (12.5 mg total) by mouth 2 (two) times daily.   multivitamin tablet Take 1 tablet by mouth 2 (two) times daily.   pantoprazole 40 MG tablet Commonly known as:  PROTONIX Take 40 mg by mouth every morning.   RECLAST IV Inject into the vein. YEARLY   VITAMIN A PO Take 1 capsule by mouth daily.   Vitamin D3 5000 units Caps Take 1 capsule by mouth daily.   VITAMIN E PO Take 1 capsule by mouth daily.      Allergies  Allergen Reactions  . Lac Bovis Diarrhea  . Prednisone Diarrhea  . Sulfa Antibiotics Itching  . Tape Dermatitis  . Zithromax [Azithromycin] Diarrhea   Follow-up Information    Mar Daring, [place]. Schedule an appointment as soon as possible for a visit in 10 day(s).   Specialty:  Family Medicine Contact information: [place] [address] [phone]           The results of significant diagnostics from this hospitalization (including imaging, microbiology, ancillary and laboratory) are listed below for reference.    Significant Diagnostic Studies: Mr Brain 46 Contrast  Result Date: 02/24/2018 CLINICAL DATA:  67 year old female with left facial and left extremity numbness and weakness today beginning approximately 1220 hours. EXAM: MRI HEAD WITHOUT CONTRAST TECHNIQUE: Multiplanar, multiecho pulse sequences of the brain and surrounding structures were obtained without intravenous contrast. COMPARISON:  Head CT without contrast 1242 hours. FINDINGS: Brain: No restricted diffusion to suggest acute infarction. No midline shift, mass effect, ventriculomegaly, extra-axial collection or acute intracranial hemorrhage. Cervicomedullary junction and pituitary are within normal limits. Pearline Cables and white matter signal is within normal limits for age throughout the brain. No cortical encephalomalacia or chronic cerebral blood products identified. The deep gray matter nuclei and brainstem appear normal. Within the posterior interhemispheric fissure there is evidence on several sequences of a small 3-4 millimeter probably extra-axial focus of gray matter isointense T1 but increased FLAIR hyperintensity and diffusion (series 5, image 50, series 9, image 36 and  series 10, image 66. This is not evident on all sequences, nor is it correlated on the earlier CT. Vascular: Major intracranial vascular flow voids are preserved, the distal right vertebral artery appears dominant. Skull and upper cervical spine: Partially visible C3-C4 disc and endplate degeneration with evidence of mild degenerative spinal stenosis. Associated degenerative endplate marrow signal changes at  those levels. Other visualized bone marrow signal is within normal limits. Sinuses/Orbits: Postoperative changes to the globes, otherwise normal orbits soft tissues. Paranasal sinuses and mastoids are stable and well pneumatized. Other: Visible internal auditory structures appear normal. Scalp and face soft tissues appear negative. IMPRESSION: 1.  No acute intracranial abnormality. 2. 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 This appears inconsequential. 3. Otherwise normal for age noncontrast MRI appearance of the brain. 4. Partially visible C3-C4 cervical spine degeneration with suspected mild spinal stenosis. Electronically Signed   By: Genevie Ann M.D.   On: 02/24/2018 15:54   US Carotid Bilateral (at [facility] And [facility] Only)  Result Date: 02/24/2018 CLINICAL DATA:  Left-sided paresthesias, visual disturbance EXAM: BILATERAL CAROTID DUPLEX ULTRASOUND TECHNIQUE: Pearline Cables scale imaging, color Doppler and duplex ultrasound were performed of bilateral carotid and vertebral arteries in the neck. COMPARISON:  02/24/2018 head CT without contrast FINDINGS: Criteria: Quantification of carotid stenosis is based on velocity parameters that correlate the residual internal carotid diameter with NASCET-based stenosis levels, using the diameter of the distal internal carotid lumen as the denominator for stenosis measurement. The following velocity measurements were obtained: RIGHT ICA: 85/35 cm/sec CCA: 32/95 cm/sec SYSTOLIC ICA/CCA RATIO:  0.9 ECA:  88 cm/sec LEFT ICA: 92/28 cm/sec CCA: 18/84 cm/sec SYSTOLIC ICA/CCA RATIO:  1.7 ECA:  81 cm/sec RIGHT CAROTID ARTERY: Mild carotid intimal thickening and early atherosclerosis. No hemodynamically significant right ICA stenosis, velocity elevation, or turbulent flow. Degree of narrowing less than 50%. RIGHT VERTEBRAL ARTERY:  Antegrade LEFT CAROTID ARTERY: Similar mild carotid intimal thickening and early atherosclerosis. No hemodynamically significant left ICA stenosis, velocity  elevation, or turbulent flow. LEFT VERTEBRAL ARTERY:  Antegrade IMPRESSION: Minor carotid atherosclerosis. No hemodynamically significant ICA stenosis. Degree of narrowing less than 50% bilaterally by ultrasound criteria. Patent antegrade vertebral flow bilaterally Electronically Signed   By: Jerilynn Mages.  Shick M.D.   On: 02/24/2018 15:16   Mr Jodene Nam Head/brain ZY Cm  Result Date: 02/24/2018 CLINICAL DATA:  66 year old female with left facial and left extremity numbness and weakness today beginning approximately 1220 hours. EXAM: MRA HEAD WITHOUT CONTRAST TECHNIQUE: Angiographic images of the Circle of Willis were obtained using MRA technique without intravenous contrast. COMPARISON:  Brain MRI today reported separately. Carotid Doppler ultrasound today. FINDINGS: Antegrade flow in the posterior circulation with dominant distal right vertebral artery. Patent PICA origins. No distal vertebral stenosis. Patent vertebrobasilar junction. Patent basilar artery without stenosis. AICA and SCA origins are patent. Normal PCA origins. Posterior communicating arteries are diminutive or absent. Bilateral PCA branches are within normal limits. Antegrade flow in both ICA siphons. Tortuous cervical right ICA just below the skull base. The right siphon appears dominant. No siphon stenosis. Normal ophthalmic artery origins. Patent carotid termini. The right ACA A1 segment is dominant while the left is diminutive or absent. The anterior communicating artery and visible ACA branches are within normal limits. The left MCA M1 segment, left MCA bifurcation, and visible left MCA branches are within normal limits. The right MCA M1 segment, right MCA trifurcation, and visible right MCA branches are within normal limits. IMPRESSION: Negative intracranial MRA. Electronically Signed   By: Lemmie Evens  [place] Crane M.D.   On: 02/24/2018 15:58   Ct Head Code Stroke Wo Contrast  Result Date: 02/24/2018 CLINICAL DATA:  Code stroke. Left-sided facial numbness and facial  droop beginning 1220 hours. Left-sided body weakness. EXAM: CT HEAD WITHOUT CONTRAST TECHNIQUE: Contiguous axial images were obtained from the base of the skull through the vertex without intravenous contrast. COMPARISON:  None. FINDINGS: Brain: Normal appearance without evidence of atrophy, old or acute small or large vessel infarction mass lesion, hemorrhage hydrocephalus or extra-axial collection. Vascular: No abnormal vascular finding. Skull: Normal Sinuses/Orbits: Clear/normal Other: None ASPECTS ([place] Stroke Program Early CT Score) - Ganglionic level infarction (caudate, lentiform nuclei, internal capsule, insula, M1-M3 cortex): 7 - Supraganglionic infarction (M4-M6 cortex): 3 Total score (0-10 with 10 being normal): 10 IMPRESSION: 1. Normal head CT. 2. Aspects is 10. 3. These results were called by telephone at the time of interpretation on 02/24/2018 at 12:54 pm to Dr. Henry Russel ED assistant, who verbally acknowledged these results. Electronically Signed   By: Nelson Chimes M.D.   On: 02/24/2018 12:54   Mr Rosealee Albee ST Contrast  Result Date: 02/25/2018 CLINICAL DATA:  Acute onset severe left orbital headache with orbital hemorrhage. Left upper and lower extremity numbness. EXAM: MRI OF THE ORBITS WITHOUT AND WITH CONTRAST TECHNIQUE: Multiplanar, multisequence MR imaging of the orbits was performed both before and after the administration of intravenous contrast. CONTRAST:  82mL MULTIHANCE GADOBENATE DIMEGLUMINE 529 MG/ML IV SOLN COMPARISON:  Noncontrast brain MRI 02/24/2018 FINDINGS: Bilateral cataract extraction is noted. There is uniform T2 hyperintense fluid signal throughout the globes without abnormal enhancement. The optic nerves are symmetric and normal in appearance without evidence of edema or abnormal enhancement. No orbital mass or inflammatory changes are identified. The extraocular muscles and lacrimal glands are symmetric and normal in appearance. The optic chiasm is unremarkable.  There is no sellar, suprasellar, or cavernous sinus mass. A 5 x 3 mm focus of extra-axial enhancement is noted projecting leftward from the posterior falx and corresponding to the signal abnormality described on the prior noncontrast MRI (series 9, image 50). No abnormal intracranial enhancement is evident elsewhere. IMPRESSION: 1. Negative orbital imaging. 2. 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 Electronically Signed   By: Logan Bores M.D.   On: 02/25/2018 10:50   Labs: Basic Metabolic Panel: Recent Labs  Lab 02/24/18 1256 02/24/18 1301 02/25/18 0612  NA 143 143 143  K 3.7 3.8 3.9  CL 111 108 109  CO2 26  --  28  GLUCOSE 72 74 84  BUN 16 14 11   CREATININE 0.52 0.50 0.43*  CALCIUM 8.5*  --  7.7*   Liver Function Tests: Recent Labs  Lab 02/24/18 1256  AST 20  ALT 26  ALKPHOS 76  BILITOT 0.6  PROT 5.8*  ALBUMIN 3.3*   CBC: Recent Labs  Lab 02/24/18 1256 02/24/18 1301 02/25/18 0612  WBC 6.6  --  3.8*  NEUTROABS 4.3  --   --   HGB 13.3 13.9 12.3  HCT 42.3 41.0 38.0  MCV 99.1  --  99.2  PLT 202  --  174    Signed:  Barton Dubois MD.  [facility] 02/25/2018, 2:19 PM

## 2018-02-25 NOTE — Consult Note (Signed)
Nunn A. Merlene Laughter, MD     [email or web address]          [name] is an 67 y.o. female.   ASSESSMENT/PLAN: 1.  Left sensory symptoms /numbness occurring after the onset of severe left orbital headache and severe hemorrhage.  Imaging failed to show acute ischemic stroke or parenchymal hemorrhage.  Presentation is somewhat unusual.  Given the prominent symptoms involving the left orbit additional imaging will be obtained.  Orbital MRI without contrast will be obtained.  Of the logical consultations recommended.  Low-dose aspirin 81 mg is recommended.      This is a 66 year old white female who presented with the acute onset of severe left orbital headache.  She reports that it appears the something ruptured in her eye.  This is associated with visible hemorrhage of the sclera, pain on eye movement and left facial numbness.  She subsequently developed numbness involving the left upper and lower extremity.  She does not report having some specific weakness although she did have some difficulty stumbling with the left lower extremity indicating some ataxia or weakness.  The symptoms lasted for few hours and has improved significantly.  She does not report having facial weakness or slurring of her speech.  No loss of consciousness was reported.  No chest pain or palpitations.  No shortness of breath.  She does report having some visual blurring on the left.  She does have a history on deficiency anemia and rectal bleeding and has been transfused iron and packed red blood cell for this.      GENERAL:   She is sitting up in a chair and in no acute distress.  HEENT:   Severe hemorrhage of the skull area left eye.  There is pain on eye movements of the left eye.  ABDOMEN: Soft  EXTREMITIES: No edema   BACK: Normal alignment.  SKIN: Normal by inspection.    MENTAL STATUS: Alert and oriented. Speech, language and cognition are generally intact. Judgment and insight  normal.   CRANIAL NERVES: Pupils are equal, round and reactive to light and accommodation; extraocular movements are full, there is no significant nystagmus; upper and lower facial muscles are normal in strength and symmetric, there is no flattening of the nasolabial folds; tongue is midline; uvula is midline; shoulder elevation is normal.  MOTOR: Normal tone, bulk and strength; no pronator drift.  COORDINATION: Left finger to nose is normal, right finger to nose is normal, No rest tremor; no intention tremor; no postural tremor; no bradykinesia.  REFLEXES: Deep tendon reflexes are symmetrical and normal. Plantar responses are flexor bilaterally.   SENSATION: Normal to light touch and temperature.     Blood pressure 110/65, pulse 60, temperature 97.7 F (36.5 C), temperature source Oral, resp. rate 16, height '5\' 3"'$  (1.6 m), weight 157 lb 4.8 oz (71.4 kg), SpO2 100 %.  Past Medical History:  Diagnosis Date  . Asthma   . GERD (gastroesophageal reflux disease)   . Internal hemorrhoids   . Iron deficiency anemia   . Osteoporosis   . Peptic ulcer   . Tubular adenoma of colon     Past Surgical History:  Procedure Laterality Date  . BACK SURGERY    . BREAST SURGERY    . CHOLECYSTECTOMY    . COLONOSCOPY WITH PROPOFOL N/A 06/02/2017   Procedure: COLONOSCOPY WITH PROPOFOL;  Surgeon: Jonathon Bellows, MD;  Location: [facility] ENDOSCOPY;  Service: Gastroenterology;  Laterality: N/A;  . ESOPHAGOGASTRODUODENOSCOPY (EGD) WITH PROPOFOL N/A 07/01/2017  Procedure: ESOPHAGOGASTRODUODENOSCOPY (EGD) WITH PROPOFOL;  Surgeon: Jonathon Bellows, MD;  Location: [facility] ENDOSCOPY;  Service: Gastroenterology;  Laterality: N/A;  . FLEXIBLE SIGMOIDOSCOPY N/A 07/01/2017   Procedure: FLEXIBLE SIGMOIDOSCOPY;  Surgeon: Jonathon Bellows, MD;  Location: [facility] ENDOSCOPY;  Service: Gastroenterology;  Laterality: N/A;  . KNEE SURGERY    . mini gastric bypass    . SHOULDER SURGERY    . TONSILLECTOMY      Family History  Problem  Relation Age of Onset  . Hypertension Other   . Stroke Other   . Diabetes Other   . Heart attack Other   . Obesity Other   . Diabetes Father   . Heart disease Father   . Colon cancer Father   . Breast cancer Paternal Grandmother   . Healthy Daughter   . Stomach cancer Neg Hx   . Pancreatic cancer Neg Hx     Social History:  reports that she has never smoked. She has never used smokeless tobacco. She reports that she does not drink alcohol or use drugs.  Allergies:  Allergies  Allergen Reactions  . Lac Bovis Diarrhea  . Prednisone Diarrhea  . Sulfa Antibiotics Itching  . Tape Dermatitis  . Zithromax [Azithromycin] Diarrhea    Medications: Prior to Admission medications   Medication Sig Start Date End Date Taking? Authorizing Provider  calcitRIOL (ROCALTROL) 0.5 MCG capsule Take 0.5 mcg by mouth daily. 08/30/16  Yes [provider]  Cholecalciferol (VITAMIN D3) 5000 UNITS CAPS Take 1 capsule by mouth daily.    Yes [provider]  Melatonin 5 MG CAPS Take by mouth.   Yes [provider]  metoprolol tartrate (LOPRESSOR) 25 MG tablet Take 0.5 tablets (12.5 mg total) by mouth 2 (two) times daily. 01/24/18 04/24/18 Yes Herminio Commons, MD  Multiple Vitamin (MULTIVITAMIN) tablet Take 1 tablet by mouth 2 (two) times daily.    Yes [provider]  pantoprazole (PROTONIX) 40 MG tablet Take 40 mg by mouth every morning. 08/17/16  Yes [provider]  VITAMIN A PO Take 1 capsule by mouth daily.    Yes [provider]  VITAMIN E PO Take 1 capsule by mouth daily.    Yes [provider]  Zoledronic Acid (RECLAST IV) Inject into the vein. YEARLY   Yes [provider]  diclofenac sodium (VOLTAREN) 1 % GEL Apply three grams to three large joints up to three times daily. Patient not taking: Reported on 02/24/2018 10/18/17   Ofilia Neas, PA-C    Scheduled Meds: . aspirin  300 mg Rectal Daily   Or  . aspirin  325 mg  Oral Daily  . calcitRIOL  0.5 mcg Oral Daily  . cholecalciferol  5,000 Units Oral Daily  . heparin  5,000 Units Subcutaneous Q8H  . metoprolol tartrate  12.5 mg Oral BID  . multivitamin with minerals  1 tablet Oral Daily  . pantoprazole  40 mg Oral q morning - 10a   Continuous Infusions: . sodium chloride 50 mL/hr at 02/24/18 1916   PRN Meds:.acetaminophen **OR** acetaminophen (TYLENOL) oral liquid 160 mg/5 mL **OR** acetaminophen, morphine injection, senna-docusate     Results for orders placed or performed during the hospital encounter of 02/24/18 (from the past 48 hour(s))  Protime-INR     Status: None   Collection Time: 02/24/18 12:56 PM  Result Value Ref Range   Prothrombin Time 12.5 11.4 - 15.2 seconds   INR 0.94     Comment: Performed at [facility],  [address], [place], [place] [address]  APTT     Status: None   Collection Time: 02/24/18 12:56 PM  Result Value Ref Range   aPTT 32 24 - 36 seconds    Comment: Performed at [facility], [address] [place], [place] [address]  CBC     Status: None   Collection Time: 02/24/18 12:56 PM  Result Value Ref Range   WBC 6.6 4.0 - 10.5 K/uL   RBC 4.27 3.87 - 5.11 MIL/uL   Hemoglobin 13.3 12.0 - 15.0 g/dL   HCT 42.3 36.0 - 46.0 %   MCV 99.1 78.0 - 100.0 fL   MCH 31.1 26.0 - 34.0 pg   MCHC 31.4 30.0 - 36.0 g/dL   RDW 13.2 11.5 - 15.5 %   Platelets 202 150 - 400 K/uL    Comment: Performed at [facility], [address] [place], [place] [address]  Differential     Status: None   Collection Time: 02/24/18 12:56 PM  Result Value Ref Range   Neutrophils Relative % 65 %   Neutro Abs 4.3 1.7 - 7.7 K/uL   Lymphocytes Relative 22 %   Lymphs Abs 1.5 0.7 - 4.0 K/uL   Monocytes Relative 7 %   Monocytes Absolute 0.5 0.1 - 1.0 K/uL   Eosinophils Relative 5 %   Eosinophils Absolute 0.3 0.0 - 0.7 K/uL   Basophils Relative 1 %   Basophils Absolute 0.0 0.0 - 0.1 K/uL    Comment: Performed at [facility], [address],  [place], [place] [address]  Comprehensive metabolic panel     Status: Abnormal   Collection Time: 02/24/18 12:56 PM  Result Value Ref Range   Sodium 143 135 - 145 mmol/L   Potassium 3.7 3.5 - 5.1 mmol/L   Chloride 111 101 - 111 mmol/L   CO2 26 22 - 32 mmol/L   Glucose, Bld 72 65 - 99 mg/dL   BUN 16 6 - 20 mg/dL   Creatinine, Ser 0.52 0.44 - 1.00 mg/dL   Calcium 8.5 (L) 8.9 - 10.3 mg/dL   Total Protein 5.8 (L) 6.5 - 8.1 g/dL   Albumin 3.3 (L) 3.5 - 5.0 g/dL   AST 20 15 - 41 U/L   ALT 26 14 - 54 U/L   Alkaline Phosphatase 76 38 - 126 U/L   Total Bilirubin 0.6 0.3 - 1.2 mg/dL   GFR calc non Af Amer >60 >60 mL/min   GFR calc Af Amer >60 >60 mL/min    Comment: (NOTE) The eGFR has been calculated using the CKD EPI equation. This calculation has not been validated in all clinical situations. eGFR's persistently <60 mL/min signify possible Chronic Kidney Disease.    Anion gap 6 5 - 15    Comment: Performed at [facility], [address] [place], [place] [address]  TSH     Status: None   Collection Time: 02/24/18 12:56 PM  Result Value Ref Range   TSH 3.903 0.350 - 4.500 uIU/mL    Comment: Performed by a 3rd Generation assay with a functional sensitivity of <=0.01 uIU/mL. Performed at [facility], [address] [place], [place] [address]   I-stat troponin, ED     Status: None   Collection Time: 02/24/18 12:59 PM  Result Value Ref Range   Troponin i, poc 0.01 0.00 - 0.08 ng/mL   Comment 3            Comment: Due to the release kinetics of cTnI, a  negative result within the first hours of the onset of symptoms does not rule out myocardial infarction with certainty. If myocardial infarction is still suspected, repeat the test at appropriate intervals.   I-Stat Chem 8, ED     Status: None   Collection Time: 02/24/18  1:01 PM  Result Value Ref Range   Sodium 143 135 - 145 mmol/L   Potassium 3.8 3.5 - 5.1 mmol/L   Chloride 108 101 - 111 mmol/L   BUN 14 6 - 20 mg/dL   Creatinine,  Ser 0.50 0.44 - 1.00 mg/dL   Glucose, Bld 74 65 - 99 mg/dL   Calcium, Ion 1.17 1.15 - 1.40 mmol/L   TCO2 22 22 - 32 mmol/L   Hemoglobin 13.9 12.0 - 15.0 g/dL   HCT 41.0 36.0 - 46.0 %  Vitamin B12     Status: None   Collection Time: 02/24/18  2:13 PM  Result Value Ref Range   Vitamin B-12 784 180 - 914 pg/mL    Comment: (NOTE) This assay is not validated for testing neonatal or myeloproliferative syndrome specimens for Vitamin B12 levels. Performed at [facility], [place] [address], [place], [place] [address]   CBC     Status: Abnormal   Collection Time: 02/25/18  6:12 AM  Result Value Ref Range   WBC 3.8 (L) 4.0 - 10.5 K/uL   RBC 3.83 (L) 3.87 - 5.11 MIL/uL   Hemoglobin 12.3 12.0 - 15.0 g/dL   HCT 38.0 36.0 - 46.0 %   MCV 99.2 78.0 - 100.0 fL   MCH 32.1 26.0 - 34.0 pg   MCHC 32.4 30.0 - 36.0 g/dL   RDW 13.2 11.5 - 15.5 %   Platelets 174 150 - 400 K/uL    Comment: Performed at [facility], [address], [place], [place] [address]    Studies/Results:   HEAD CT IMPRESSION: 1. Normal head CT. 2. Aspects is 10.     CAROTID DOPLLERS IMPRESSION: Minor carotid atherosclerosis. No hemodynamically significant ICA stenosis. Degree of narrowing less than 50% bilaterally by ultrasound criteria.  Patent antegrade vertebral flow bilaterally    BRAIN MRA FINDINGS: Antegrade flow in the posterior circulation with dominant distal right vertebral artery. Patent PICA origins. No distal vertebral stenosis. Patent vertebrobasilar junction. Patent basilar artery without stenosis. AICA and SCA origins are patent. Normal PCA origins. Posterior communicating arteries are diminutive or absent. Bilateral PCA branches are within normal limits.  Antegrade flow in both ICA siphons. Tortuous cervical right ICA just below the skull base. The right siphon appears dominant. No siphon stenosis. Normal ophthalmic artery origins. Patent carotid termini. The right ACA A1 segment is  dominant while the left is diminutive or absent. The anterior communicating artery and visible ACA branches are within normal limits. The left MCA M1 segment, left MCA bifurcation, and visible left MCA branches are within normal limits. The right MCA M1 segment, right MCA trifurcation, and visible right MCA branches are within normal limits.  IMPRESSION: Negative intracranial MRA.      BRAIN MRI FINDINGS: Brain: No restricted diffusion to suggest acute infarction. No midline shift, mass effect, ventriculomegaly, extra-axial collection or acute intracranial hemorrhage. Cervicomedullary junction and pituitary are within normal limits.  Pearline Cables and white matter signal is within normal limits for age throughout the brain. No cortical encephalomalacia or chronic cerebral blood products identified. The deep gray matter nuclei and brainstem appear normal.  Within the posterior interhemispheric fissure there is evidence on several sequences of a  small 3-4 millimeter probably extra-axial focus of gray matter isointense T1 but increased FLAIR hyperintensity and diffusion (series 5, image 50, series 9, image 36 and series 10, image 66. This is not evident on all sequences, nor is it correlated on the earlier CT.  Vascular: Major intracranial vascular flow voids are preserved, the distal right vertebral artery appears dominant.  Skull and upper cervical spine: Partially visible C3-C4 disc and endplate degeneration with evidence of mild degenerative spinal stenosis. Associated degenerative endplate marrow signal changes at those levels. Other visualized bone marrow signal is within normal limits.  Sinuses/Orbits: Postoperative changes to the globes, otherwise normal orbits soft tissues.  Paranasal sinuses and mastoids are stable and well pneumatized.  Other: Visible internal auditory structures appear normal. Scalp and face soft tissues appear negative.  IMPRESSION: 1.  No  acute intracranial abnormality. 2. 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 This appears inconsequential. 3. Otherwise normal for age noncontrast MRI appearance of the brain. 4. Partially visible C3-C4 cervical spine degeneration with suspected mild spinal stenosis.   ECHO - Left ventricle: The cavity size was normal. Wall thickness was   normal. Systolic function was normal. The estimated ejection   fraction was in the range of 55% to 60%. Wall motion was normal;   there were no regional wall motion abnormalities. Left   ventricular diastolic function parameters were normal. - Aortic valve: There was mild regurgitation. Valve area (VTI):   2.38 cm^2. Valve area (Vmax): 2.18 cm^2. Valve area (Vmean): 2.02   cm^2. - Mitral valve: There was mild regurgitation. - Left atrium: The atrium was mildly dilated. - Technically adequate study.     The brain MRI and MRA are reviewed in person.  No acute lesions are seen on DWI.  No hemorrhages appreciated.  There is a tiny increased signal on flair medial frontal region that appears to be pus potentially accessible into officially read as a possible tiny meningioma.  There is some mild white matter chronic ischemic changes.  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  Otherwise no occlusive disease.     Sefora Tietje A. Merlene Laughter, M.D.  Diplomate, Tax adviser of Psychiatry and Neurology ( Neurology). 02/25/2018, 7:20 AM

## 2018-02-25 NOTE — Progress Notes (Signed)
SLP Cancellation Note  SLP screened Pt in room with daughters present. Pt dressed in sitting in chair waiting for discharge today. Pt denies any changes in swallowing, speech, language, or cognition. CT head and MRI/MRA negative for acute changes. SLE will be deferred at this time. Reconsult if indicated. SLP will sign off. Nursing confirmed d/c today.   Patient Details Name: [name] MRN: [id number] DOB: 30-Mar-1951   Cancelled treatment:          Joneen Boers  M.A., CCC-SLP Cerria [email or web address] .Berdie Ogren Siarah Deleo 02/25/2018, 3:40 PM

## 2018-02-25 NOTE — Progress Notes (Signed)
Pt's IV catheter removed and intact. Pt's IV site clean dry and intact. Discharge instructions including medications and follow up appointments were reviewed and discussed with patient. All questions were answered and no further questions at this time. Pt in stable condition and in no acute distress at this time. Pt escorted by nurse tech

## 2018-02-28 ENCOUNTER — Telehealth: Payer: Self-pay

## 2018-02-28 ENCOUNTER — Ambulatory Visit (HOSPITAL_COMMUNITY): Payer: 59
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
## 2018-03-02 ENCOUNTER — Ambulatory Visit (HOSPITAL_COMMUNITY): Payer: 59 | Admitting: Hematology

## 2018-03-07 ENCOUNTER — Ambulatory Visit: Payer: 59 | Admitting: Physician Assistant

## 2018-03-07 ENCOUNTER — Ambulatory Visit (HOSPITAL_COMMUNITY): Payer: 59

## 2018-03-07 ENCOUNTER — Telehealth: Payer: Self-pay | Admitting: *Deleted

## 2018-03-07 ENCOUNTER — Encounter: Payer: Self-pay | Admitting: Physician Assistant

## 2018-03-07 VITALS — BP 90/60 | HR 66 | Temp 98.3°F | Resp 16 | Wt 160.6 lb

## 2018-03-07 DIAGNOSIS — H44812 Hemophthalmos, left eye: Secondary | ICD-10-CM

## 2018-03-07 DIAGNOSIS — H5712 Ocular pain, left eye: Secondary | ICD-10-CM | POA: Diagnosis not present

## 2018-03-07 DIAGNOSIS — R200 Anesthesia of skin: Secondary | ICD-10-CM | POA: Diagnosis not present

## 2018-03-07 DIAGNOSIS — R202 Paresthesia of skin: Secondary | ICD-10-CM | POA: Diagnosis not present

## 2018-03-07 DIAGNOSIS — Z9189 Other specified personal risk factors, not elsewhere classified: Secondary | ICD-10-CM

## 2018-03-07 DIAGNOSIS — IMO0001 Reserved for inherently not codable concepts without codable children: Secondary | ICD-10-CM

## 2018-03-07 NOTE — Progress Notes (Signed)
Patient: [name] Female    DOB: 1951/09/05   67 y.o.   MRN: [id number] Visit Date: 03/07/2018  Today's Provider: Mar Daring, PA-C   Chief Complaint  Patient presents with  . Hospitalization Follow-up   Subjective:    HPI  Follow up Hospitalization  Patient was admitted to [facility] on 02/24/18 and discharged on 02/25/18 She was treated for stroke like-symptoms. Neuro consult completed and stroke ruled out. Treatment for this included -CT head and MRI/MRA negative -Carotid US: mild carotid atherosclerosis (< 50% bilaterally). Patent antegrade flow in vertebral arteries. -no abnormalities on telemetry  -discharge on ASA 81 mg daily for secondary prevention.  -following PT/OT evaluation no needs at discharge.  -normal TSH, B12, A1C and lipid panel   Telephone follow up was done on 02/28/18. She reports fair compliance good treatment. She reports this condition is Unchanged since she left the hospital. She is continuing to have left eye pain and left sided weakness since discharge that is unchanged.   Patient reports that on 02/24/18 she was sitting around, not doing anything physical and felt a sudden "explosion" in her left eye. She had immediate pain in the left eye with hemorrhage (not encompassing the iris or pupil) and numbness on the left side of her face, jaw and left ear. She went to ER and had work up for stroke, which was negative. She was discharged with instructions to follow up with PCP and ophthalmology. She does not have a permanent ophthalmology home (Previously seen at [facility] she thinks but this has been years). ------------------------------------------------------------------------------------     Allergies  Allergen Reactions  . Lac Bovis Diarrhea  . Prednisone Diarrhea  . Sulfa Antibiotics Itching  . Tape Dermatitis  . Zithromax [Azithromycin] Diarrhea     Current Outpatient Medications:  .  aspirin EC 81 MG tablet,  Take 1 tablet (81 mg total) by mouth daily., Disp: 30 tablet, Rfl: 3 .  calcitRIOL (ROCALTROL) 0.5 MCG capsule, Take 0.5 mcg by mouth daily., Disp: , Rfl: 5 .  Cholecalciferol (VITAMIN D3) 5000 UNITS CAPS, Take 1 capsule by mouth daily. , Disp: , Rfl:  .  Melatonin 5 MG CAPS, Take by mouth., Disp: , Rfl:  .  metoprolol tartrate (LOPRESSOR) 25 MG tablet, Take 0.5 tablets (12.5 mg total) by mouth 2 (two) times daily., Disp: 30 tablet, Rfl: 6 .  Multiple Vitamin (MULTIVITAMIN) tablet, Take 1 tablet by mouth 2 (two) times daily. , Disp: , Rfl:  .  pantoprazole (PROTONIX) 40 MG tablet, Take 40 mg by mouth every morning., Disp: , Rfl: 3 .  VITAMIN A PO, Take 1 capsule by mouth daily. , Disp: , Rfl:  .  VITAMIN E PO, Take 1 capsule by mouth daily. , Disp: , Rfl:  .  Zoledronic Acid (RECLAST IV), Inject into the vein. YEARLY, Disp: , Rfl:  .  diclofenac sodium (VOLTAREN) 1 % GEL, Apply three grams to three large joints up to three times daily. (Patient not taking: Reported on 02/24/2018), Disp: 3 Tube, Rfl: 3  Review of Systems  Eyes: Positive for pain ("sore" with movement it hurts) and redness.  Respiratory: Negative for shortness of breath.   Cardiovascular: Positive for leg swelling. Negative for chest pain and palpitations.    Social History   Tobacco Use  . Smoking status: Never Smoker  . Smokeless tobacco: Never Used  Substance Use Topics  . Alcohol use: No    Alcohol/week: 0.0 oz  Objective:   BP 90/60 (BP Location: Left Arm, Patient Position: Sitting, Cuff Size: Normal)   Pulse 66   Temp 98.3 F (36.8 C) (Oral)   Resp 16   Wt 160 lb 9.6 oz (72.8 kg)   SpO2 97%   BMI 28.45 kg/m    Physical Exam  CLINICAL DATA:  Code stroke. Left-sided facial numbness and facial droop beginning 1220 hours. Left-sided body weakness.  EXAM: CT HEAD WITHOUT CONTRAST  TECHNIQUE: Contiguous axial images were obtained from the base of the skull through the vertex without intravenous  contrast.  COMPARISON:  None.  FINDINGS: Brain: Normal appearance without evidence of atrophy, old or acute small or large vessel infarction mass lesion, hemorrhage hydrocephalus or extra-axial collection.  Vascular: No abnormal vascular finding.  Skull: Normal  Sinuses/Orbits: Clear/normal  Other: None  ASPECTS ([place] Stroke Program Early CT Score)  - Ganglionic level infarction (caudate, lentiform nuclei, internal capsule, insula, M1-M3 cortex): 7  - Supraganglionic infarction (M4-M6 cortex): 3  Total score (0-10 with 10 being normal): 10  IMPRESSION: 1. Normal head CT. 2. Aspects is 10. 3. These results were called by telephone at the time of interpretation on 02/24/2018 at 12:54 pm to Dr. Henry Russel ED assistant, who verbally acknowledged these results.   Electronically Signed   By: Nelson Chimes M.D.   On: 02/24/2018 12:54  CLINICAL DATA:  67 year old female with left facial and left extremity numbness and weakness today beginning approximately 1220 hours.  EXAM: MRI HEAD WITHOUT CONTRAST  TECHNIQUE: Multiplanar, multiecho pulse sequences of the brain and surrounding structures were obtained without intravenous contrast.  COMPARISON:  Head CT without contrast 1242 hours.  FINDINGS: Brain: No restricted diffusion to suggest acute infarction. No midline shift, mass effect, ventriculomegaly, extra-axial collection or acute intracranial hemorrhage. Cervicomedullary junction and pituitary are within normal limits.  Pearline Cables and white matter signal is within normal limits for age throughout the brain. No cortical encephalomalacia or chronic cerebral blood products identified. The deep gray matter nuclei and brainstem appear normal.  Within the posterior interhemispheric fissure there is evidence on several sequences of a small 3-4 millimeter probably extra-axial focus of gray matter isointense T1 but increased FLAIR hyperintensity  and diffusion (series 5, image 50, series 9, image 36 and series 10, image 66. This is not evident on all sequences, nor is it correlated on the earlier CT.  Vascular: Major intracranial vascular flow voids are preserved, the distal right vertebral artery appears dominant.  Skull and upper cervical spine: Partially visible C3-C4 disc and endplate degeneration with evidence of mild degenerative spinal stenosis. Associated degenerative endplate marrow signal changes at those levels. Other visualized bone marrow signal is within normal limits.  Sinuses/Orbits: Postoperative changes to the globes, otherwise normal orbits soft tissues.  Paranasal sinuses and mastoids are stable and well pneumatized.  Other: Visible internal auditory structures appear normal. Scalp and face soft tissues appear negative.  IMPRESSION: 1.  No acute intracranial abnormality. 2. 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 This appears inconsequential. 3. Otherwise normal for age noncontrast MRI appearance of the brain. 4. Partially visible C3-C4 cervical spine degeneration with suspected mild spinal stenosis.   Electronically Signed   By: Genevie Ann M.D.   On: 02/24/2018 15:54  CLINICAL DATA:  67 year old female with left facial and left extremity numbness and weakness today beginning approximately 1220 hours.  EXAM: MRA HEAD WITHOUT CONTRAST  TECHNIQUE: Angiographic images of the Circle of Willis were obtained using MRA technique without intravenous contrast.  COMPARISON:  Brain MRI today reported separately. Carotid Doppler ultrasound today.  FINDINGS: Antegrade flow in the posterior circulation with dominant distal right vertebral artery. Patent PICA origins. No distal vertebral stenosis. Patent vertebrobasilar junction. Patent basilar artery without stenosis. AICA and SCA origins are patent. Normal PCA origins. Posterior communicating arteries are diminutive or  absent. Bilateral PCA branches are within normal limits.  Antegrade flow in both ICA siphons. Tortuous cervical right ICA just below the skull base. The right siphon appears dominant. No siphon stenosis. Normal ophthalmic artery origins. Patent carotid termini. The right ACA A1 segment is dominant while the left is diminutive or absent. The anterior communicating artery and visible ACA branches are within normal limits. The left MCA M1 segment, left MCA bifurcation, and visible left MCA branches are within normal limits. The right MCA M1 segment, right MCA trifurcation, and visible right MCA branches are within normal limits.  IMPRESSION: Negative intracranial MRA.   Electronically Signed   By: Genevie Ann M.D.   On: 02/24/2018 15:58  CLINICAL DATA:  Acute onset severe left orbital headache with orbital hemorrhage. Left upper and lower extremity numbness.  EXAM: MRI OF THE ORBITS WITHOUT AND WITH CONTRAST  TECHNIQUE: Multiplanar, multisequence MR imaging of the orbits was performed both before and after the administration of intravenous contrast.  CONTRAST:  72mL MULTIHANCE GADOBENATE DIMEGLUMINE 529 MG/ML IV SOLN  COMPARISON:  Noncontrast brain MRI 02/24/2018  FINDINGS: Bilateral cataract extraction is noted. There is uniform T2 hyperintense fluid signal throughout the globes without abnormal enhancement. The optic nerves are symmetric and normal in appearance without evidence of edema or abnormal enhancement. No orbital mass or inflammatory changes are identified. The extraocular muscles and lacrimal glands are symmetric and normal in appearance. The optic chiasm is unremarkable. There is no sellar, suprasellar, or cavernous sinus mass.  A 5 x 3 mm focus of extra-axial enhancement is noted projecting leftward from the posterior falx and corresponding to the signal abnormality described on the prior noncontrast MRI (series 9, image 50). No abnormal intracranial  enhancement is evident elsewhere.  IMPRESSION: 1. Negative orbital imaging. 2. 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   Electronically Signed   By: Logan Bores M.D.   On: 02/25/2018 10:50     Assessment & Plan:     1. Transition of care performed with sharing of clinical summary Notes, consults, images and labs reviewed from hospitalization on 6/13-6/14/19.  2. Eye hemorrhage, left No improvements after 10 days. Some old blood noted in corners of sclera (yellowing) but still with a lot of hemorrhage noted. Still having pain with EOM. Some blurred vision noted. Will refer urgently to [facility] for further evaluation. Referral placed and phone call placed to schedule. No answer on appointment line so a message was left for them to call me back to schedule. Patient scheduled for a trip to [facility] on Sunday (flying) and would prefer evaluation prior to flying.  - Ambulatory referral to Ophthalmology  3. Eye pain, left See above medical treatment plan. - Ambulatory referral to Ophthalmology  4. Numbness and tingling of left side of face Improved.  - Ambulatory referral to Ophthalmology       Mar Daring, PA-C  [facility]

## 2018-03-07 NOTE — Telephone Encounter (Signed)
Patient was returning call 

## 2018-03-08 ENCOUNTER — Ambulatory Visit (HOSPITAL_COMMUNITY): Payer: 59

## 2018-03-08 NOTE — Telephone Encounter (Signed)
Pt is calling back wanting to know if we have gotten an appt with  eye for her.  She assummed  Sonia Baller was making the appt yesterday.  Please advise  teri

## 2018-03-08 NOTE — Telephone Encounter (Signed)
Called [facility] and patient is scheduled today at 3:00 pm  Thanks,  -Avaiah Stempel

## 2018-03-09 ENCOUNTER — Encounter (HOSPITAL_COMMUNITY): Payer: Self-pay

## 2018-03-09 ENCOUNTER — Other Ambulatory Visit: Payer: Self-pay

## 2018-03-09 ENCOUNTER — Inpatient Hospital Stay (HOSPITAL_COMMUNITY): Payer: 59

## 2018-03-09 VITALS — BP 97/65 | HR 61 | Temp 98.0°F | Resp 18

## 2018-03-09 DIAGNOSIS — D500 Iron deficiency anemia secondary to blood loss (chronic): Secondary | ICD-10-CM

## 2018-03-09 DIAGNOSIS — K922 Gastrointestinal hemorrhage, unspecified: Secondary | ICD-10-CM

## 2018-03-09 MED ORDER — SODIUM CHLORIDE 0.9 % IV SOLN
510.0000 mg | Freq: Once | INTRAVENOUS | Status: AC
  Administered 2018-03-09: 510 mg via INTRAVENOUS
  Filled 2018-03-09: qty 17

## 2018-03-09 MED ORDER — SODIUM CHLORIDE 0.9 % IV SOLN
INTRAVENOUS | Status: DC
  Administered 2018-03-09: 12:00:00 via INTRAVENOUS

## 2018-03-09 NOTE — Patient Instructions (Signed)
[place] at [facility] Discharge Instructions  Feraheme given  Follow up as scheduled.   Thank you for choosing [place] at [facility] to provide your oncology and hematology care.  To afford each patient quality time with our provider, please arrive at least 15 minutes before your scheduled appointment time.   If you have a lab appointment with the [facility] please come in thru the  Main Entrance and check in at the main information desk  You need to re-schedule your appointment should you arrive 10 or more minutes late.  We strive to give you quality time with our providers, and arriving late affects you and other patients whose appointments are after yours.  Also, if you no show three or more times for appointments you may be dismissed from the clinic at the providers discretion.     Again, thank you for choosing [facility].  Our hope is that these requests will decrease the amount of time that you wait before being seen by our physicians.       _____________________________________________________________  Should you have questions after your visit to [facility], please contact our office at [phone] between the hours of 8:30 a.m. and 4:30 p.m.  Voicemails left after 4:30 p.m. will not be returned until the following business day.  For prescription refill requests, have your pharmacy contact our office.       Resources For Cancer Patients and their Caregivers ? American Cancer Society: Can assist with transportation, wigs, general needs, runs Look Good Feel Better.        ([phone] ? Cancer Care: Provides financial assistance, online support groups, medication/co-pay assistance.  1-800-813-HOPE 419-254-3307) ? [facility] Assists [place] cancer patients and their families through emotional , educational and financial support.  234-359-7816 ? [place] DSS Where  to apply for food stamps, Medicaid and utility assistance. 480 843 7386 ? [employer]: Transportation to medical appointments. 9735494959 ? Social Security Administration: May apply for disability if have a Stage IV cancer. (902)601-7237 816 082 5198 ? [employer], Disability and Transit Services: Assists with nutrition, care and transit needs. [place] Support Programs:   > Cancer Support Group  2nd Tuesday of the month 1pm-2pm, Journey Room   > Creative Journey  3rd Tuesday of the month 1130am-1pm, Journey Room

## 2018-03-09 NOTE — Progress Notes (Signed)
Feraheme given today.  Patient tolerated it well without problems. Vitals stable and discharged home from clinic ambulatory. Follow up as scheduled.

## 2018-03-21 ENCOUNTER — Inpatient Hospital Stay (HOSPITAL_COMMUNITY): Payer: 59 | Attending: Hematology

## 2018-03-21 ENCOUNTER — Encounter (HOSPITAL_COMMUNITY): Payer: Self-pay

## 2018-03-21 VITALS — BP 96/67 | HR 62 | Temp 97.6°F | Resp 18

## 2018-03-21 DIAGNOSIS — D500 Iron deficiency anemia secondary to blood loss (chronic): Secondary | ICD-10-CM | POA: Diagnosis not present

## 2018-03-21 DIAGNOSIS — K922 Gastrointestinal hemorrhage, unspecified: Secondary | ICD-10-CM | POA: Diagnosis present

## 2018-03-21 MED ORDER — SODIUM CHLORIDE 0.9 % IV SOLN
510.0000 mg | Freq: Once | INTRAVENOUS | Status: AC
  Administered 2018-03-21: 510 mg via INTRAVENOUS
  Filled 2018-03-21: qty 17

## 2018-03-21 MED ORDER — SODIUM CHLORIDE 0.9% FLUSH
10.0000 mL | Freq: Once | INTRAVENOUS | Status: AC
  Administered 2018-03-21: 10 mL via INTRAVENOUS

## 2018-03-21 MED ORDER — SODIUM CHLORIDE 0.9 % IV SOLN
INTRAVENOUS | Status: DC
  Administered 2018-03-21: 14:00:00 via INTRAVENOUS

## 2018-03-21 NOTE — Progress Notes (Signed)
Patient tolerated infusion with no complaints voiced.  Peripheral IV site clean and dry with no bruising or swelling noted at site.  No complaints of pain with peripheral IV site.  Band aid applied.  Good blood return noted before and after administration of iron.  VSS with discharge and left ambulatory with no s/s of distress noted.

## 2018-03-21 NOTE — Patient Instructions (Signed)
[facility]  Discharge Instructions:  You received an iron infusion today.  _______________________________________________________________  Thank you for choosing [facility] at [facility] to provide your oncology and hematology care.  To afford each patient quality time with our providers, please arrive at least 15 minutes before your scheduled appointment.  You need to re-schedule your appointment if you arrive 10 or more minutes late.  We strive to give you quality time with our providers, and arriving late affects you and other patients whose appointments are after yours.  Also, if you no show three or more times for appointments you may be dismissed from the clinic.  Again, thank you for choosing [facility] at [facility]. Our hope is that these requests will allow you access to exceptional care and in a timely manner. _______________________________________________________________  If you have questions after your visit, please contact our office at [phone] between the hours of 8:30 a.m. and 5:00 p.m. Voicemails left after 4:30 p.m. will not be returned until the following business day. _______________________________________________________________  For prescription refill requests, have your pharmacy contact our office. _______________________________________________________________  Recommendations made by the consultant and any test results will be sent to your referring physician. _______________________________________________________________ 

## 2018-04-29 NOTE — Progress Notes (Signed)
Office Visit Note  Patient: [name]             Date of Birth: 11/13/1950           MRN: [id number]             PCP: Mar Daring, PA-C Referring: Florian Buff* Visit Date: 05/03/2018 Occupation: @GUAROCC @  Subjective:  Neck pain  History of Present Illness: [name] is a 67 y.o. female with history of DDD and osteoarthritis.  She reports she has been having worsening neck pain and stiffness.  She denies any symptoms of radiculopathy at this time. She has noticed increased limited ROM. She continues to have lower back pain and stiffness but denies any radiation of pain or symptoms of sciatica.  She reports right CMC joint pain.  She states in the past cortisone injections have helped.  She wears a CMC joint brace intermittently.  She continues to have dupuytren's contracture of the left hand, but she does not want to proceed with surgery.  She has increased left hand pain and stiffness.  She is worried about her ROM worsening.   Activities of Daily Living:  Patient reports morning stiffness for several hours.   Patient Reports nocturnal pain.  Difficulty dressing/grooming: Denies Difficulty climbing stairs: Reports Difficulty getting out of chair: Denies Difficulty using hands for taps, buttons, cutlery, and/or writing: Reports  Review of Systems  Constitutional: Positive for fatigue.  HENT: Negative for mouth sores, trouble swallowing, trouble swallowing, mouth dryness and nose dryness.   Eyes: Negative for pain, visual disturbance and dryness.  Respiratory: Negative for cough, hemoptysis, shortness of breath and difficulty breathing.   Cardiovascular: Negative for chest pain, palpitations, hypertension and swelling in legs/feet.  Gastrointestinal: Negative for abdominal pain, blood in stool, constipation, diarrhea, nausea and vomiting.  Endocrine: Negative for increased urination.  Genitourinary: Negative for painful urination and pelvic  pain.  Musculoskeletal: Positive for arthralgias, joint pain, joint swelling and morning stiffness. Negative for myalgias, muscle weakness, muscle tenderness and myalgias.  Skin: Negative for color change, pallor, rash, hair loss, nodules/bumps, skin tightness, ulcers and sensitivity to sunlight.  Allergic/Immunologic: Negative for susceptible to infections.  Neurological: Negative for dizziness, light-headedness, numbness, headaches and memory loss.  Hematological: Negative for swollen glands.  Psychiatric/Behavioral: Negative for depressed mood, confusion and sleep disturbance. The patient is not nervous/anxious.     PMFS History:  Patient Active Problem List   Diagnosis Date Noted  . Left-sided weakness   . Stroke-like symptoms 02/24/2018  . HTN (hypertension) 02/24/2018  . Primary osteoarthritis of both hands 11/03/2017  . Dupuytren's contracture of left hand 11/03/2017  . DDD (degenerative disc disease), lumbar 11/03/2017  . History of gastric bypass 11/03/2017  . Lymphedema 09/17/2017  . History of vertebral fracture 09/17/2017  . History of osteoporosis 09/17/2017  . Iron deficiency anemia due to chronic blood loss 06/28/2017  . Lower GI bleed 05/31/2017  . Rectal mass 05/31/2017  . Asthma 05/31/2017  . GERD (gastroesophageal reflux disease) 05/31/2017  . Closed fracture of upper end of humerus 09/23/2016  . Tendinitis 09/23/2016  . Patulous eustachian tube of right ear 11/07/2015  . 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 Vitamin D deficiency 08/20/2014  . Secondary hyperparathyroidism ([place]) 08/20/2014    Past Medical History:  Diagnosis Date  . Asthma   . GERD (gastroesophageal reflux disease)   . Internal hemorrhoids   . Iron deficiency anemia   . Osteoporosis   .  Peptic ulcer   . Tubular adenoma of colon     Family History  Problem Relation Age of Onset  . Hypertension Other   . Stroke Other   . Diabetes Other   . Heart attack Other    . Obesity Other   . Diabetes Father   . Heart disease Father   . Colon cancer Father   . Breast cancer Paternal Grandmother   . Healthy Daughter   . Stomach cancer Neg Hx   . Pancreatic cancer Neg Hx    Past Surgical History:  Procedure Laterality Date  . BACK SURGERY    . BREAST SURGERY    . CHOLECYSTECTOMY    . COLONOSCOPY WITH PROPOFOL N/A 06/02/2017   Procedure: COLONOSCOPY WITH PROPOFOL;  Surgeon: Jonathon Bellows, MD;  Location: [facility] ENDOSCOPY;  Service: Gastroenterology;  Laterality: N/A;  . ESOPHAGOGASTRODUODENOSCOPY (EGD) WITH PROPOFOL N/A 07/01/2017   Procedure: ESOPHAGOGASTRODUODENOSCOPY (EGD) WITH PROPOFOL;  Surgeon: Jonathon Bellows, MD;  Location: [facility] ENDOSCOPY;  Service: Gastroenterology;  Laterality: N/A;  . FLEXIBLE SIGMOIDOSCOPY N/A 07/01/2017   Procedure: FLEXIBLE SIGMOIDOSCOPY;  Surgeon: Jonathon Bellows, MD;  Location: [facility] ENDOSCOPY;  Service: Gastroenterology;  Laterality: N/A;  . KNEE SURGERY    . mini gastric bypass    . SHOULDER SURGERY    . TONSILLECTOMY     Social History   Social History Narrative  . Not on file    Objective: Vital Signs: BP 100/67 (BP Location: Left Arm, Patient Position: Sitting, Cuff Size: Normal)   Pulse 68   Resp 14   Ht 5' 2.99" (1.6 m)   Wt 166 lb 9.6 oz (75.6 kg)   BMI 29.52 kg/m    Physical Exam  Constitutional: She is oriented to person, place, and time. She appears well-developed and well-nourished.  HENT:  Head: Normocephalic and atraumatic.  Eyes: Conjunctivae and EOM are normal.  Neck: Normal range of motion.  Cardiovascular: Normal rate, regular rhythm, normal heart sounds and intact distal pulses.  Pulmonary/Chest: Effort normal and breath sounds normal.  Abdominal: Soft. Bowel sounds are normal.  Lymphadenopathy:    She has no cervical adenopathy.  Neurological: She is alert and oriented to person, place, and time.  Skin: Skin is warm and dry. Capillary refill takes less than 2 seconds.  Psychiatric: She has a  normal mood and affect. Her behavior is normal.  Nursing note and vitals reviewed.    Musculoskeletal Exam: C-spine limited lateral rotation, good flexion and extension.  Thoracic kyphosis.  Shoulder joints, elbow joints, wrist joints, MCPs, PIPs, and DIPs good ROM with no synovitis.  PIP and DIP synovial thickening.  Bilateral CMC joint synovial thickening. Hip joints good ROM with no discomfort.  Knee joints good ROM with no warmth or effusion.    CDAI Exam: CDAI Score: Not documented Patient Global Assessment: Not documented; Provider Global Assessment: Not documented Swollen: Not documented; Tender: Not documented Joint Exam   Not documented   There is currently no information documented on the homunculus. Go to the Rheumatology activity and complete the homunculus joint exam.  Investigation: No additional findings.  Imaging: Xr Cervical Spine 2 Or 3 Views  Result Date: 05/03/2018 Mild spondylosis of cervical spine and noted with anterior and posterior spurring.  No significant facet joint arthropathy was noted.   Recent Labs: Lab Results  Component Value Date   WBC 3.8 (L) 02/25/2018   HGB 12.3 02/25/2018   PLT 174 02/25/2018   NA 143 02/25/2018   K 3.9 02/25/2018  CL 109 02/25/2018   CO2 28 02/25/2018   GLUCOSE 84 02/25/2018   BUN 11 02/25/2018   CREATININE 0.43 (L) 02/25/2018   BILITOT 0.6 02/24/2018   ALKPHOS 76 02/24/2018   AST 20 02/24/2018   ALT 26 02/24/2018   PROT 5.8 (L) 02/24/2018   ALBUMIN 3.3 (L) 02/24/2018   CALCIUM 7.7 (L) 02/25/2018   GFRAA >60 02/25/2018    Speciality Comments: No specialty comments available.  Procedures:  No procedures performed Allergies: Lac bovis; Prednisone; Sulfa antibiotics; Tape; and Zithromax [azithromycin]   Assessment / Plan:     Visit Diagnoses: Primary osteoarthritis of both hands: She has PIP and DIP synovial thickening. She has right CMC joint tenderness and synovial thickening on exam.  She has previously  had a right CMC joint cortisone injection, which provided pain relief.  She wears a right CMC joint brace occasionally.  We discussed joint protection and muscle strengthening.    Dupuytren's contracture of left hand -  She does not want to proceed with surgery at this time.  She has been having worsening left hand pain and stiffness.  She was given a handout of hand exercises.  A referral to hand rehab was also placed. Plan: Ambulatory referral to Physical Therapy  DDD (degenerative disc disease), lumbar: She has no midline spinal tenderness.  She has chronic pain and lower back stiffness.   History of osteoporosis - She previously had 1 infusion of Reclast, and she experienced side effects the following 2 days.  She does not want to proceed with yearly Reclast infusions.  She was previously on Forteo.  She is followed by her endocrinologist Dr. Buddy Duty.   History of vertebral fracture: She has no midline spinal tenderness at this time.   Vitamin D deficiency - She is taking calicum and vitamin D daily.   Neck pain - She has been having increased neck pain and stiffness.  No symptoms of radiculopathy at this time.  She requested a x-ray of the C-spine today, which revealed multilevel spondylosis.  She was given a handout of neck exercises that she can perform at home.  If she develops symptoms of radiculopathy, we will consider ordering a MRI of the c-spine. Plan: XR Cervical Spine 2 or 3 views   Other medical conditions are listed as follows:   Secondary hyperparathyroidism ([place])  B12 deficiency  History of gastric bypass - 2003.  Gastroesophageal reflux disease, esophagitis presence not specified  History of asthma  Iron deficiency anemia due to chronic blood loss    Orders: Orders Placed This Encounter  Procedures  . XR Cervical Spine 2 or 3 views  . Ambulatory referral to Physical Therapy   No orders of the defined types were placed in this encounter.   Face-to-face time  spent with patient was 30 minutes. Greater than 50% of time was spent in counseling and coordination of care.  Follow-Up Instructions: Return in about 6 months (around 11/03/2018) for Osteoarthritis, DDD.   Ofilia Neas, PA-C   I examined and evaluated the patient with Hazel Sams PA.  She had no synovitis on my examination.  The clinical exam is consistent with osteoarthritis.  The plan of care was discussed as noted above.  Bo Merino, MD  Note - This record has been created using Editor, commissioning.  Chart creation errors have been sought, but may not always  have been located. Such creation errors do not reflect on  the standard of medical care.

## 2018-05-03 ENCOUNTER — Encounter: Payer: Self-pay | Admitting: Rheumatology

## 2018-05-03 ENCOUNTER — Ambulatory Visit (INDEPENDENT_AMBULATORY_CARE_PROVIDER_SITE_OTHER): Payer: Self-pay

## 2018-05-03 ENCOUNTER — Ambulatory Visit: Payer: 59 | Admitting: Rheumatology

## 2018-05-03 VITALS — BP 100/67 | HR 68 | Resp 14 | Ht 62.99 in | Wt 166.6 lb

## 2018-05-03 DIAGNOSIS — Z8781 Personal history of (healed) traumatic fracture: Secondary | ICD-10-CM

## 2018-05-03 DIAGNOSIS — D500 Iron deficiency anemia secondary to blood loss (chronic): Secondary | ICD-10-CM

## 2018-05-03 DIAGNOSIS — M542 Cervicalgia: Secondary | ICD-10-CM | POA: Diagnosis not present

## 2018-05-03 DIAGNOSIS — M19041 Primary osteoarthritis, right hand: Secondary | ICD-10-CM | POA: Diagnosis not present

## 2018-05-03 DIAGNOSIS — M5136 Other intervertebral disc degeneration, lumbar region: Secondary | ICD-10-CM | POA: Diagnosis not present

## 2018-05-03 DIAGNOSIS — E538 Deficiency of other specified B group vitamins: Secondary | ICD-10-CM

## 2018-05-03 DIAGNOSIS — M720 Palmar fascial fibromatosis [Dupuytren]: Secondary | ICD-10-CM

## 2018-05-03 DIAGNOSIS — M51369 Other intervertebral disc degeneration, lumbar region without mention of lumbar back pain or lower extremity pain: Secondary | ICD-10-CM

## 2018-05-03 DIAGNOSIS — Z8709 Personal history of other diseases of the respiratory system: Secondary | ICD-10-CM

## 2018-05-03 DIAGNOSIS — M19042 Primary osteoarthritis, left hand: Secondary | ICD-10-CM

## 2018-05-03 DIAGNOSIS — Z9884 Bariatric surgery status: Secondary | ICD-10-CM

## 2018-05-03 DIAGNOSIS — Z8739 Personal history of other diseases of the musculoskeletal system and connective tissue: Secondary | ICD-10-CM | POA: Diagnosis not present

## 2018-05-03 DIAGNOSIS — E559 Vitamin D deficiency, unspecified: Secondary | ICD-10-CM

## 2018-05-03 DIAGNOSIS — N2581 Secondary hyperparathyroidism of renal origin: Secondary | ICD-10-CM

## 2018-05-03 DIAGNOSIS — K219 Gastro-esophageal reflux disease without esophagitis: Secondary | ICD-10-CM

## 2018-05-03 NOTE — Patient Instructions (Signed)
Hand Exercises Hand exercises can be helpful to almost anyone. These exercises can strengthen the hands, improve flexibility and movement, and increase blood flow to the hands. These results can make work and daily tasks easier. Hand exercises can be especially helpful for people who have joint pain from arthritis or have nerve damage from overuse (carpal tunnel syndrome). These exercises can also help people who have injured a hand. Most of these hand exercises are fairly gentle stretching routines. You can do them often throughout the day. Still, it is a good idea to ask your health care provider which exercises would be best for you. Warming your hands before exercise may help to reduce stiffness. You can do this with gentle massage or by placing your hands in warm water for 15 minutes. Also, make sure you pay attention to your level of hand pain as you begin an exercise routine. Exercises Knuckle Bend Repeat this exercise 5-10 times with each hand. 1. Stand or sit with your arm, hand, and all five fingers pointed straight up. Make sure your wrist is straight. 2. Gently and slowly bend your fingers down and inward until the tips of your fingers are touching the tops of your palm. 3. Hold this position for a few seconds. 4. Extend your fingers out to their original position, all pointing straight up again.  Finger Fan Repeat this exercise 5-10 times with each hand. 1. Hold your arm and hand out in front of you. Keep your wrist straight. 2. Squeeze your hand into a fist. 3. Hold this position for a few seconds. 4. Fan out, or spread apart, your hand and fingers as much as possible, stretching every joint fully.  Tabletop Repeat this exercise 5-10 times with each hand. 1. Stand or sit with your arm, hand, and all five fingers pointed straight up. Make sure your wrist is straight. 2. Gently and slowly bend your fingers at the knuckles where they meet the hand until your hand is making an  upside-down L shape. Your fingers should form a tabletop. 3. Hold this position for a few seconds. 4. Extend your fingers out to their original position, all pointing straight up again.  Making Os Repeat this exercise 5-10 times with each hand. 1. Stand or sit with your arm, hand, and all five fingers pointed straight up. Make sure your wrist is straight. 2. Make an O shape by touching your pointer finger to your thumb. Hold for a few seconds. Then open your hand wide. 3. Repeat this motion with each finger on your hand.  Table Spread Repeat this exercise 5-10 times with each hand. 1. Place your hand on a table with your palm facing down. Make sure your wrist is straight. 2. Spread your fingers out as much as possible. Hold this position for a few seconds. 3. Slide your fingers back together again. Hold for a few seconds.  Ball Grip  Repeat this exercise 10-15 times with each hand. 1. Hold a tennis ball or another soft ball in your hand. 2. While slowly increasing pressure, squeeze the ball as hard as possible. 3. Squeeze as hard as you can for 3-5 seconds. 4. Relax and repeat.  Wrist Curls Repeat this exercise 10-15 times with each hand. 1. Sit in a chair that has armrests. 2. Hold a light weight in your hand, such as a dumbbell that weighs 1-3 pounds (0.5-1.4 kg). Ask your health care provider what weight would be best for you. 3. Rest your hand just over   the end of the chair arm with your palm facing up. 4. Gently pivot your wrist up and down while holding the weight. Do not twist your wrist from side to side.  Contact a health care provider if:  Your hand pain or discomfort gets much worse when you do an exercise.  Your hand pain or discomfort does not improve within 2 hours after you exercise. If you have any of these problems, stop doing these exercises right away. Do not do them again unless your health care provider says that you can. Get help right away if:  You  develop sudden, severe hand pain. If this happens, stop doing these exercises right away. Do not do them again unless your health care provider says that you can. This information is not intended to replace advice given to you by your health care provider. Make sure you discuss any questions you have with your health care provider. Document Released: 08/12/2015 Document Revised: 02/06/2016 Document Reviewed: 03/11/2015 Elsevier Interactive Patient Education  2018 [employer]. Neck Exercises Neck exercises can be important for many reasons:  They can help you to improve and maintain flexibility in your neck. This can be especially important as you age.  They can help to make your neck stronger. This can make movement easier.  They can reduce or prevent neck pain.  They may help your upper back.  Ask your health care provider which neck exercises would be best for you. Exercises Neck Press Repeat this exercise 10 times. Do it first thing in the morning and right before bed or as told by your health care provider. 1. Lie on your back on a firm bed or on the floor with a pillow under your head. 2. Use your neck muscles to push your head down on the pillow and straighten your spine. 3. Hold the position as well as you can. Keep your head facing up and your chin tucked. 4. Slowly count to 5 while holding this position. 5. Relax for a few seconds. Then repeat.  Isometric Strengthening Do a full set of these exercises 2 times a day or as told by your health care provider. 1. Sit in a supportive chair and place your hand on your forehead. 2. Push forward with your head and neck while pushing back with your hand. Hold for 10 seconds. 3. Relax. Then repeat the exercise 3 times. 4. Next, do thesequence again, this time putting your hand against the back of your head. Use your head and neck to push backward against the hand pressure. 5. Finally, do the same exercise on either side of your head,  pushing sideways against the pressure of your hand.  Prone Head Lifts Repeat this exercise 5 times. Do this 2 times a day or as told by your health care provider. 1. Lie face-down, resting on your elbows so that your chest and upper back are raised. 2. Start with your head facing downward, near your chest. Position your chin either on or near your chest. 3. Slowly lift your head upward. Lift until you are looking straight ahead. Then continue lifting your head as far back as you can stretch. 4. Hold your head up for 5 seconds. Then slowly lower it to your starting position.  Supine Head Lifts Repeat this exercise 8-10 times. Do this 2 times a day or as told by your health care provider. 1. Lie on your back, bending your knees to point to the ceiling and keeping your feet flat on  the floor. 2. Lift your head slowly off the floor, raising your chin toward your chest. 3. Hold for 5 seconds. 4. Relax and repeat.  Scapular Retraction Repeat this exercise 5 times. Do this 2 times a day or as told by your health care provider. 1. Stand with your arms at your sides. Look straight ahead. 2. Slowly pull both shoulders backward and downward until you feel a stretch between your shoulder blades in your upper back. 3. Hold for 10-30 seconds. 4. Relax and repeat.  Contact a health care provider if:  Your neck pain or discomfort gets much worse when you do an exercise.  Your neck pain or discomfort does not improve within 2 hours after you exercise. If you have any of these problems, stop exercising right away. Do not do the exercises again unless your health care provider says that you can. Get help right away if:  You develop sudden, severe neck pain. If this happens, stop exercising right away. Do not do the exercises again unless your health care provider says that you can. Exercises Neck Stretch  Repeat this exercise 3-5 times. 1. Do this exercise while standing or while sitting in a  chair. 2. Place your feet flat on the floor, shoulder-width apart. 3. Slowly turn your head to the right. Turn it all the way to the right so you can look over your right shoulder. Do not tilt or tip your head. 4. Hold this position for 10-30 seconds. 5. Slowly turn your head to the left, to look over your left shoulder. 6. Hold this position for 10-30 seconds.  Neck Retraction Repeat this exercise 8-10 times. Do this 3-4 times a day or as told by your health care provider. 1. Do this exercise while standing or while sitting in a sturdy chair. 2. Look straight ahead. Do not bend your neck. 3. Use your fingers to push your chin backward. Do not bend your neck for this movement. Continue to face straight ahead. If you are doing the exercise properly, you will feel a slight sensation in your throat and a stretch at the back of your neck. 4. Hold the stretch for 1-2 seconds. Relax and repeat.  This information is not intended to replace advice given to you by your health care provider. Make sure you discuss any questions you have with your health care provider. Document Released: 08/12/2015 Document Revised: 02/06/2016 Document Reviewed: 03/11/2015 Elsevier Interactive Patient Education  [employer].

## 2018-05-05 ENCOUNTER — Ambulatory Visit: Payer: 59 | Admitting: Cardiovascular Disease

## 2018-05-09 ENCOUNTER — Telehealth: Payer: Self-pay | Admitting: Rheumatology

## 2018-05-09 NOTE — Telephone Encounter (Signed)
Left message to advise patient the hand center of [facility] is trying to schedule appointment. Provided number for patient to call.

## 2018-05-09 NOTE — Telephone Encounter (Signed)
Robin from [facility] called to let you know that they received the fax and the physical therapy department has called and left a message for patient to schedule an appointment.

## 2018-05-10 ENCOUNTER — Telehealth: Payer: Self-pay | Admitting: Rheumatology

## 2018-05-10 ENCOUNTER — Ambulatory Visit: Payer: 59 | Admitting: Rheumatology

## 2018-05-10 NOTE — Telephone Encounter (Signed)
Left message to advise patient that the hand center of [facility] is the one that would be able to help with her Dupuytren's contracture of left hand. Patient advised there is not a place in [place] that we are aware. Advised patient to call the office with name of where she would like to go if she knows of somewhere.

## 2018-05-10 NOTE — Telephone Encounter (Signed)
Patient called stating she would like to be referred to [facility] in [place] for her hand physical therapy.  Patient states she lives in [place] and doesn't want to drive to [facility] for physical therapy.

## 2018-05-11 ENCOUNTER — Telehealth: Payer: Self-pay | Admitting: Rheumatology

## 2018-05-11 NOTE — Telephone Encounter (Signed)
Per Lorriane Shire they do not do PT that is being requested for patient. [facility] can accommodate request. Lorriane Shire spoke with patient, and she is okay with going to [facility].  Please send referral to ph# [phone].

## 2018-05-12 NOTE — Telephone Encounter (Signed)
Referral faxed, pending appt ?

## 2018-06-01 ENCOUNTER — Ambulatory Visit (INDEPENDENT_AMBULATORY_CARE_PROVIDER_SITE_OTHER): Payer: Medicare Other | Admitting: Cardiovascular Disease

## 2018-06-01 ENCOUNTER — Encounter: Payer: Self-pay | Admitting: Cardiovascular Disease

## 2018-06-01 VITALS — BP 100/64 | HR 68 | Ht 64.0 in | Wt 165.0 lb

## 2018-06-01 DIAGNOSIS — R0789 Other chest pain: Secondary | ICD-10-CM

## 2018-06-01 DIAGNOSIS — K219 Gastro-esophageal reflux disease without esophagitis: Secondary | ICD-10-CM

## 2018-06-01 DIAGNOSIS — R002 Palpitations: Secondary | ICD-10-CM

## 2018-06-01 NOTE — Progress Notes (Signed)
SUBJECTIVE: The patient presents for routine follow-up.  She was hospitalized in June 2019 for strokelike symptoms with negative head CT and MRI/MRA.  Carotid Dopplers showed mild carotid atherosclerosis with less than 50% stenosis bilaterally.  There were no abnormalities on telemetry.  She was discharged on aspirin 81 mg for secondary prevention.  In summary, nuclear stress test on 12/07/2017 showed defects with significant degree of soft tissue attenuation but a mild degree of septal ischemia could not entirely be ruled out.  While left ventricular systolic function appeared grossly normal, LVEF is calculated at 25%.  Echocardiogram however demonstrated normal left ventricular systolic and diastolic function and normal regional wall motion, LVEF 55 to 60%.  There was mild mitral and aortic regurgitation and mild left atrial dilatation.  Event monitoring demonstrated sinus rhythm with PACs and atrial runs.  I initially prescribed metoprolol tartrate 12.5 mg twice daily.  She routinely took the morning dose but often forgot to take the evening dose.  Her palpitations then subsided and she stopped taking it altogether.  She denies chest pain, shortness of breath, dizziness, orthopnea, and paroxysmal nocturnal dyspnea.    Family history:Her father died of an MI at age 71. He developed coronary artery disease in his early 24s and underwent four-vessel CABG at age 53.  Soc Hx:She lives in [place] and moved from [place], [place], in 2018 with her husband to be close to her daughter who lives in [place] and had a baby.   Review of Systems: As per "subjective", otherwise negative.  Allergies  Allergen Reactions  . Lac Bovis Diarrhea  . Prednisone Diarrhea  . Sulfa Antibiotics Itching  . Tape Dermatitis  . Zithromax [Azithromycin] Diarrhea    Current Outpatient Medications  Medication Sig Dispense Refill  . calcitRIOL (ROCALTROL) 0.5 MCG capsule Take 0.5 mcg by mouth  daily.  5  . Cholecalciferol (VITAMIN D3) 5000 UNITS CAPS Take 1 capsule by mouth daily.     . Melatonin 5 MG CAPS Take by mouth as needed.     . Multiple Vitamin (MULTIVITAMIN) tablet Take 1 tablet by mouth 2 (two) times daily.     . pantoprazole (PROTONIX) 40 MG tablet Take 40 mg by mouth every morning.  3  . VITAMIN A PO Take 1 capsule by mouth daily.     Marland Kitchen VITAMIN E PO Take 1 capsule by mouth daily.      No current facility-administered medications for this visit.     Past Medical History:  Diagnosis Date  . Asthma   . GERD (gastroesophageal reflux disease)   . Internal hemorrhoids   . Iron deficiency anemia   . Osteoporosis   . Peptic ulcer   . Tubular adenoma of colon     Past Surgical History:  Procedure Laterality Date  . BACK SURGERY    . BREAST SURGERY    . CHOLECYSTECTOMY    . COLONOSCOPY WITH PROPOFOL N/A 06/02/2017   Procedure: COLONOSCOPY WITH PROPOFOL;  Surgeon: Jonathon Bellows, MD;  Location: [facility] ENDOSCOPY;  Service: Gastroenterology;  Laterality: N/A;  . ESOPHAGOGASTRODUODENOSCOPY (EGD) WITH PROPOFOL N/A 07/01/2017   Procedure: ESOPHAGOGASTRODUODENOSCOPY (EGD) WITH PROPOFOL;  Surgeon: Jonathon Bellows, MD;  Location: [facility] ENDOSCOPY;  Service: Gastroenterology;  Laterality: N/A;  . FLEXIBLE SIGMOIDOSCOPY N/A 07/01/2017   Procedure: FLEXIBLE SIGMOIDOSCOPY;  Surgeon: Jonathon Bellows, MD;  Location: [facility] ENDOSCOPY;  Service: Gastroenterology;  Laterality: N/A;  . KNEE SURGERY    . mini gastric bypass    . SHOULDER SURGERY    .  TONSILLECTOMY      Social History   Socioeconomic History  . Marital status: Married    Spouse name: Not on file  . Number of children: Not on file  . Years of education: Not on file  . Highest education level: Not on file  Occupational History  . Not on file  Social Needs  . Financial resource strain: Not on file  . Food insecurity:    Worry: Not on file    Inability: Not on file  . Transportation needs:    Medical: Not on file     Non-medical: Not on file  Tobacco Use  . Smoking status: Never Smoker  . Smokeless tobacco: Never Used  Substance and Sexual Activity  . Alcohol use: No    Alcohol/week: 0.0 standard drinks  . Drug use: Never  . Sexual activity: Never  Lifestyle  . Physical activity:    Days per week: Not on file    Minutes per session: Not on file  . Stress: Not on file  Relationships  . Social connections:    Talks on phone: Not on file    Gets together: Not on file    Attends religious service: Not on file    Active member of club or organization: Not on file    Attends meetings of clubs or organizations: Not on file    Relationship status: Not on file  . Intimate partner violence:    Fear of current or ex partner: Not on file    Emotionally abused: Not on file    Physically abused: Not on file    Forced sexual activity: Not on file  Other Topics Concern  . Not on file  Social History Narrative  . Not on file     Vitals:   06/01/18 1147  BP: 100/64  Pulse: 68  SpO2: 95%  Weight: 165 lb (74.8 kg)  Height: 5\' 4"  (1.626 m)    Wt Readings from Last 3 Encounters:  06/01/18 165 lb (74.8 kg)  05/03/18 166 lb 9.6 oz (75.6 kg)  03/07/18 160 lb 9.6 oz (72.8 kg)     PHYSICAL EXAM General: NAD HEENT: Normal. Neck: No JVD, no thyromegaly. Lungs: Clear to auscultation bilaterally with normal respiratory effort. CV: Regular rate and rhythm, normal S1/S2, no S3/S4, no murmur. No pretibial or periankle edema.  No carotid bruit.   Abdomen: Soft, nontender, no distention.  Neurologic: Alert and oriented.  Psych: Normal affect. Skin: Normal. Musculoskeletal: No gross deformities.    ECG: Reviewed above under Subjective   Labs: Lab Results  Component Value Date/Time   K 3.9 02/25/2018 06:12 AM   BUN 11 02/25/2018 06:12 AM   BUN 4 (L) 09/25/2016 03:18 PM   CREATININE 0.43 (L) 02/25/2018 06:12 AM   ALT 26 02/24/2018 12:56 PM   TSH 3.903 02/24/2018 12:56 PM   TSH 4.960 (H)  09/25/2016 03:18 PM   HGB 12.3 02/25/2018 06:12 AM   HGB 10.8 (L) 09/25/2016 03:18 PM     Lipids: Lab Results  Component Value Date/Time   LDLCALC 61 02/25/2018 06:12 AM   CHOL 126 02/25/2018 06:12 AM   TRIG 35 02/25/2018 06:12 AM   HDL 58 02/25/2018 06:12 AM       ASSESSMENT AND PLAN:  1. Palpitations:  Symptoms have subsided to a large degree.  Event monitor results noted above with sinus rhythm predominantly with episodic atrial runs and PACs associated with her symptoms.  Her blood pressure is low normal.  I previously started metoprolol tartrate 12.5 mg twice daily but she stopped it on her own.  2. Chest pressure with radiation to the neck:  No further symptoms.  Nuclear stress test results noted above.  There is a significant degree of soft tissue attenuation artifact but a mild degree of septal ischemia could not entirely be ruled out.  Echocardiogram demonstrated normal left ventricular systolic and diastolic function and normal regional wall motion.  I will continue to monitor her symptoms.   I previously started metoprolol tartrate 12.5 mg twice daily but she stopped taking it on her own.  3. GERD:Symptoms appear to be stable on Protonix 40 mg daily.  4.  Possible TIA: She was prescribed aspirin 81 mg but was afraid to take it.   Disposition: Follow up 1 year   Kate Sable, M.D., F.A.C.C.

## 2018-06-01 NOTE — Patient Instructions (Signed)

## 2018-06-16 ENCOUNTER — Inpatient Hospital Stay (HOSPITAL_COMMUNITY): Payer: Medicare Other | Attending: Hematology

## 2018-06-16 DIAGNOSIS — Z9884 Bariatric surgery status: Secondary | ICD-10-CM | POA: Diagnosis not present

## 2018-06-16 DIAGNOSIS — D500 Iron deficiency anemia secondary to blood loss (chronic): Secondary | ICD-10-CM | POA: Insufficient documentation

## 2018-06-16 DIAGNOSIS — K909 Intestinal malabsorption, unspecified: Secondary | ICD-10-CM | POA: Insufficient documentation

## 2018-06-16 DIAGNOSIS — R7989 Other specified abnormal findings of blood chemistry: Secondary | ICD-10-CM | POA: Insufficient documentation

## 2018-06-16 LAB — COMPREHENSIVE METABOLIC PANEL
ALK PHOS: 81 U/L (ref 38–126)
ALT: 77 U/L — AB (ref 0–44)
AST: 80 U/L — AB (ref 15–41)
Albumin: 3.2 g/dL — ABNORMAL LOW (ref 3.5–5.0)
Anion gap: 6 (ref 5–15)
BILIRUBIN TOTAL: 1 mg/dL (ref 0.3–1.2)
BUN: 12 mg/dL (ref 8–23)
CALCIUM: 8.1 mg/dL — AB (ref 8.9–10.3)
CO2: 28 mmol/L (ref 22–32)
CREATININE: 0.75 mg/dL (ref 0.44–1.00)
Chloride: 107 mmol/L (ref 98–111)
GFR calc Af Amer: >60 mL/min (ref >60)
Glucose, Bld: 65 mg/dL — ABNORMAL LOW (ref 70–99)
Potassium: 4 mmol/L (ref 3.5–5.1)
Sodium: 141 mmol/L (ref 135–145)
TOTAL PROTEIN: 5.6 g/dL — AB (ref 6.5–8.1)

## 2018-06-16 LAB — CBC WITH DIFFERENTIAL/PLATELET
BASOS PCT: 0 %
Basophils Absolute: 0 10*3/uL (ref 0.0–0.1)
EOS ABS: 0.1 10*3/uL (ref 0.0–0.7)
EOS PCT: 2 %
HCT: 41.4 % (ref 36.0–46.0)
HEMOGLOBIN: 13.3 g/dL (ref 12.0–15.0)
LYMPHS ABS: 1.2 10*3/uL (ref 0.7–4.0)
Lymphocytes Relative: 19 %
MCH: 31.8 pg (ref 26.0–34.0)
MCHC: 32.1 g/dL (ref 30.0–36.0)
MCV: 99 fL (ref 78.0–100.0)
Monocytes Absolute: 0.5 10*3/uL (ref 0.1–1.0)
Monocytes Relative: 8 %
Neutro Abs: 4.5 10*3/uL (ref 1.7–7.7)
Neutrophils Relative %: 71 %
PLATELETS: 174 10*3/uL (ref 150–400)
RBC: 4.18 MIL/uL (ref 3.87–5.11)
RDW: 13.2 % (ref 11.5–15.5)
WBC: 6.3 10*3/uL (ref 4.0–10.5)

## 2018-06-16 LAB — FERRITIN: Ferritin: 158 ng/mL (ref 11–307)

## 2018-06-16 LAB — VITAMIN B12: VITAMIN B 12: 849 pg/mL (ref 180–914)

## 2018-06-16 LAB — FOLATE: FOLATE: 16.1 ng/mL (ref >5.9)

## 2018-06-16 LAB — IRON AND TIBC
Iron: 114 ug/dL (ref 28–170)
SATURATION RATIOS: 57 % — AB (ref 10.4–31.8)
TIBC: 200 ug/dL — AB (ref 250–450)
UIBC: 86 ug/dL

## 2018-06-23 ENCOUNTER — Inpatient Hospital Stay (HOSPITAL_BASED_OUTPATIENT_CLINIC_OR_DEPARTMENT_OTHER): Payer: Medicare Other | Admitting: Hematology

## 2018-06-23 ENCOUNTER — Encounter (HOSPITAL_COMMUNITY): Payer: Self-pay | Admitting: Hematology

## 2018-06-23 VITALS — BP 99/57 | HR 75 | Temp 98.0°F | Resp 20 | Wt 162.3 lb

## 2018-06-23 DIAGNOSIS — Z9884 Bariatric surgery status: Secondary | ICD-10-CM

## 2018-06-23 DIAGNOSIS — D500 Iron deficiency anemia secondary to blood loss (chronic): Secondary | ICD-10-CM | POA: Diagnosis not present

## 2018-06-23 DIAGNOSIS — K909 Intestinal malabsorption, unspecified: Secondary | ICD-10-CM | POA: Diagnosis not present

## 2018-06-23 DIAGNOSIS — R7989 Other specified abnormal findings of blood chemistry: Secondary | ICD-10-CM

## 2018-06-23 NOTE — Patient Instructions (Signed)
[facility] Discharge Instructions     Thank you for choosing [facility] to provide your oncology and hematology care.  To afford each patient quality time with our provider, please arrive at least 15 minutes before your scheduled appointment time.   If you have a lab appointment with the Cancer Center please come in thru the  Main Entrance and check in at the main information desk  You need to re-schedule your appointment should you arrive 10 or more minutes late.  We strive to give you quality time with our providers, and arriving late affects you and other patients whose appointments are after yours.  Also, if you no show three or more times for appointments you may be dismissed from the clinic at the providers discretion.     Again, thank you for choosing [facility].  Our hope is that these requests will decrease the amount of time that you wait before being seen by our physicians.       _____________________________________________________________  Should you have questions after your visit to  Cancer Center, please contact our office at [phone] between the hours of 8:00 a.m. and 4:30 p.m.  Voicemails left after 4:00 p.m. will not be returned until the following business day.  For prescription refill requests, have your pharmacy contact our office and allow 72 hours.    Cancer Center Support Programs:   > Cancer Support Group  2nd Tuesday of the month 1pm-2pm, Journey Room    

## 2018-06-23 NOTE — Assessment & Plan Note (Signed)
1.  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  -She denies any bleeding per rectum or melena. -Last Feraheme infusion was on 03/09/2018 and 03/21/2018. -Her last colonoscopy was on 06/02/2017 at [place] which showed small mouth diverticula in sigmoid colon with nonbleeding internal hemorrhoids. -She felt much better after her last Feraheme infusions.  Her energy levels have improved. -I have reviewed the results of the blood work which showed hemoglobin of 13.3.  Ferritin improved to 158 and percent saturation of 57.  Prior ferritin was 16 in June.  2.  Elevated LFTs: -Her AST was elevated at 80.  This was previously 20.  ALT was also elevated at 77.  Alkaline phosphatase was normal.  Total bilirubin was 1.0. - She denies any recent infections.  Denies taking any Tylenol.  She does not drink alcohol.  I have recommended checking her LFTs again in 3 weeks.  If they do not normalize, will consider imaging of the liver.  On examination today, there is mild tenderness without palpable hepatomegaly in the right upper quadrant.  She already had her gallbladder taken out.

## 2018-06-23 NOTE — Progress Notes (Signed)
[name], [place] [id number]   CLINIC:  Medical Oncology/Hematology  PCP:  Rubye Beach [address] [place] [address] [phone]   REASON FOR VISIT:  Follow-up for iron deficiency anemia  CURRENT THERAPY: intermittent feraheme infusions   INTERVAL HISTORY:  [name] 67 y.o. female returns for routine follow-up for iron deficiency anemia. Patient is here today and doing well. She states here energy level is better than before however she still gets fatigued easy. She does have trouble sleeping at night and takes a sleep aide over the counter. She recently returned from her vacation in the [place]. She denies any bleeding or dark stools. Denies any nausea, vomiting, or diarrhea. Denies any new pains. She reports her appetite at 75%. She is maintaining her weight at this time. Her energy level is 75%.     REVIEW OF SYSTEMS:  Review of Systems  Constitutional: Positive for fatigue.  Hematological: Bruises/bleeds easily.  All other systems reviewed and are negative.    PAST MEDICAL/SURGICAL HISTORY:  Past Medical History:  Diagnosis Date  . Asthma   . GERD (gastroesophageal reflux disease)   . Internal hemorrhoids   . Iron deficiency anemia   . Osteoporosis   . Peptic ulcer   . Tubular adenoma of colon    Past Surgical History:  Procedure Laterality Date  . BACK SURGERY    . BREAST SURGERY    . CHOLECYSTECTOMY    . COLONOSCOPY WITH PROPOFOL N/A 06/02/2017   Procedure: COLONOSCOPY WITH PROPOFOL;  Surgeon: Jonathon Bellows, MD;  Location: [facility] ENDOSCOPY;  Service: Gastroenterology;  Laterality: N/A;  . ESOPHAGOGASTRODUODENOSCOPY (EGD) WITH PROPOFOL N/A 07/01/2017   Procedure: ESOPHAGOGASTRODUODENOSCOPY (EGD) WITH PROPOFOL;  Surgeon: Jonathon Bellows, MD;  Location: [facility] ENDOSCOPY;  Service: Gastroenterology;  Laterality: N/A;  . FLEXIBLE SIGMOIDOSCOPY N/A 07/01/2017   Procedure: FLEXIBLE  SIGMOIDOSCOPY;  Surgeon: Jonathon Bellows, MD;  Location: [facility] ENDOSCOPY;  Service: Gastroenterology;  Laterality: N/A;  . KNEE SURGERY    . mini gastric bypass    . SHOULDER SURGERY    . TONSILLECTOMY       SOCIAL HISTORY:  Social History   Socioeconomic History  . Marital status: Married    Spouse name: Not on file  . Number of children: Not on file  . Years of education: Not on file  . Highest education level: Not on file  Occupational History  . Not on file  Social Needs  . Financial resource strain: Not on file  . Food insecurity:    Worry: Not on file    Inability: Not on file  . Transportation needs:    Medical: Not on file    Non-medical: Not on file  Tobacco Use  . Smoking status: Never Smoker  . Smokeless tobacco: Never Used  Substance and Sexual Activity  . Alcohol use: No    Alcohol/week: 0.0 standard drinks  . Drug use: Never  . Sexual activity: Never  Lifestyle  . Physical activity:    Days per week: Not on file    Minutes per session: Not on file  . Stress: Not on file  Relationships  . Social connections:    Talks on phone: Not on file    Gets together: Not on file    Attends religious service: Not on file    Active member of club or organization: Not on file    Attends meetings of clubs or organizations: Not on file  Relationship status: Not on file  . Intimate partner violence:    Fear of current or ex partner: Not on file    Emotionally abused: Not on file    Physically abused: Not on file    Forced sexual activity: Not on file  Other Topics Concern  . Not on file  Social History Narrative  . Not on file    FAMILY HISTORY:  Family History  Problem Relation Age of Onset  . Hypertension Other   . Stroke Other   . Diabetes Other   . Heart attack Other   . Obesity Other   . Diabetes Father   . Heart disease Father   . Colon cancer Father   . Breast cancer Paternal Grandmother   . Healthy Daughter   . Stomach cancer Neg Hx   .  Pancreatic cancer Neg Hx     CURRENT MEDICATIONS:  Outpatient Encounter Medications as of 06/23/2018  Medication Sig  . calcitRIOL (ROCALTROL) 0.5 MCG capsule Take 0.5 mcg by mouth daily.  . Cholecalciferol (VITAMIN D3) 5000 UNITS CAPS Take 1 capsule by mouth daily.   . Melatonin 5 MG CAPS Take by mouth as needed.   . Multiple Vitamin (MULTIVITAMIN) tablet Take 1 tablet by mouth 2 (two) times daily.   . pantoprazole (PROTONIX) 40 MG tablet Take 40 mg by mouth every morning.  Marland Kitchen VITAMIN A PO Take 1 capsule by mouth daily.   Marland Kitchen VITAMIN E PO Take 1 capsule by mouth daily.    No facility-administered encounter medications on file as of 06/23/2018.     ALLERGIES:  Allergies  Allergen Reactions  . Lac Bovis Diarrhea  . Prednisone Diarrhea  . Sulfa Antibiotics Itching  . Tape Dermatitis  . Zithromax [Azithromycin] Diarrhea     PHYSICAL EXAM:  ECOG Performance status: 1  Vitals:   06/23/18 1306  BP: (!) 99/57  Pulse: 75  Resp: 20  Temp: 98 F (36.7 C)  SpO2: 100%   Filed Weights   06/23/18 1306  Weight: 162 lb 4.8 oz (73.6 kg)    Physical Exam  Constitutional: She is oriented to person, place, and time. She appears well-developed and well-nourished.  Abdominal: Soft.  Musculoskeletal: Normal range of motion.  Neurological: She is alert and oriented to person, place, and time.  Skin: Skin is warm and dry.  Psychiatric: She has a normal mood and affect. Her behavior is normal. Judgment and thought content normal.  Abdomen: Soft, mild tenderness in the right upper quadrant on deep palpation.  No palpable hepatomegaly.   LABORATORY DATA:  I have reviewed the labs as listed.  CBC    Component Value Date/Time   WBC 6.3 06/16/2018 1110   RBC 4.18 06/16/2018 1110   HGB 13.3 06/16/2018 1110   HGB 10.8 (L) 09/25/2016 1518   HCT 41.4 06/16/2018 1110   HCT 32.6 (L) 09/25/2016 1518   PLT 174 06/16/2018 1110   PLT 264 09/25/2016 1518   MCV 99.0 06/16/2018 1110   MCV 96  09/25/2016 1518   MCH 31.8 06/16/2018 1110   MCHC 32.1 06/16/2018 1110   RDW 13.2 06/16/2018 1110   RDW 15.5 (H) 09/25/2016 1518   LYMPHSABS 1.2 06/16/2018 1110   LYMPHSABS 1.2 09/25/2016 1518   MONOABS 0.5 06/16/2018 1110   EOSABS 0.1 06/16/2018 1110   EOSABS 0.1 09/25/2016 1518   BASOSABS 0.0 06/16/2018 1110   BASOSABS 0.0 09/25/2016 1518   CMP Latest Ref Rng & Units 06/16/2018 02/25/2018 02/24/2018  Glucose  70 - 99 mg/dL 65(L) 84 74  BUN 8 - 23 mg/dL 12 11 14   Creatinine 0.44 - 1.00 mg/dL 0.75 0.43(L) 0.50  Sodium 135 - 145 mmol/L 141 143 143  Potassium 3.5 - 5.1 mmol/L 4.0 3.9 3.8  Chloride 98 - 111 mmol/L 107 109 108  CO2 22 - 32 mmol/L 28 28 -  Calcium 8.9 - 10.3 mg/dL 8.1(L) 7.7(L) -  Total Protein 6.5 - 8.1 g/dL 5.6(L) - -  Total Bilirubin 0.3 - 1.2 mg/dL 1.0 - -  Alkaline Phos 38 - 126 U/L 81 - -  AST 15 - 41 U/L 80(H) - -  ALT 0 - 44 U/L 77(H) - -          ASSESSMENT & PLAN:   Iron deficiency anemia due to chronic blood loss 1.  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  -She denies any bleeding per rectum or melena. -Last Feraheme infusion was on 03/09/2018 and 03/21/2018. -Her last colonoscopy was on 06/02/2017 at [place] which showed small mouth diverticula in sigmoid colon with nonbleeding internal hemorrhoids. -She felt much better after her last Feraheme infusions.  Her energy levels have improved. -I have reviewed the results of the blood work which showed hemoglobin of 13.3.  Ferritin improved to 158 and percent saturation of 57.  Prior ferritin was 16 in June.  2.  Elevated LFTs: -Her AST was elevated at 80.  This was previously 20.  ALT was also elevated at 77.  Alkaline phosphatase was normal.  Total bilirubin was 1.0. - She denies any recent infections.  Denies taking any Tylenol.  She does not drink alcohol.  I have recommended checking her LFTs again in 3 weeks.  If they do not  normalize, will consider imaging of the liver.  On examination today, there is mild tenderness without palpable hepatomegaly in the right upper quadrant.  She already had her gallbladder taken out.  Total time spent is 25 minutes with more than 50% of the time spent face-to-face discussing her abnormal lab results and further work-up.    Orders placed this encounter:  Orders Placed This Encounter  Procedures  . Hepatic function panel  . CBC with Differential/Platelet  . Comprehensive metabolic panel  . Ferritin  . Iron and TIBC  . Vitamin B12  . Folate      Derek Jack, MD [place] [phone]

## 2018-07-15 ENCOUNTER — Inpatient Hospital Stay (HOSPITAL_COMMUNITY): Payer: Medicare Other | Attending: Hematology

## 2018-07-15 DIAGNOSIS — K909 Intestinal malabsorption, unspecified: Secondary | ICD-10-CM | POA: Diagnosis not present

## 2018-07-15 DIAGNOSIS — D500 Iron deficiency anemia secondary to blood loss (chronic): Secondary | ICD-10-CM | POA: Insufficient documentation

## 2018-07-15 LAB — HEPATIC FUNCTION PANEL
ALK PHOS: 72 U/L (ref 38–126)
ALT: 22 U/L (ref 0–44)
AST: 21 U/L (ref 15–41)
Albumin: 3.1 g/dL — ABNORMAL LOW (ref 3.5–5.0)
BILIRUBIN INDIRECT: 0.7 mg/dL (ref 0.3–0.9)
Bilirubin, Direct: 0.2 mg/dL (ref 0.0–0.2)
TOTAL PROTEIN: 5.6 g/dL — AB (ref 6.5–8.1)
Total Bilirubin: 0.9 mg/dL (ref 0.3–1.2)

## 2018-07-21 ENCOUNTER — Encounter (HOSPITAL_COMMUNITY): Payer: Self-pay | Admitting: *Deleted

## 2018-07-21 NOTE — Progress Notes (Signed)
I called and left patient voicemail stating that her liver function test came back and her labs have returned to normal. Per Francene Finders, NP at this time there is no need for futher workup.  I asked patient to call the office if she had any further questions.

## 2018-09-21 ENCOUNTER — Telehealth: Payer: Self-pay | Admitting: Rheumatology

## 2018-09-21 ENCOUNTER — Inpatient Hospital Stay (HOSPITAL_COMMUNITY): Payer: Medicare Other | Attending: Nurse Practitioner

## 2018-09-21 DIAGNOSIS — K909 Intestinal malabsorption, unspecified: Secondary | ICD-10-CM | POA: Insufficient documentation

## 2018-09-21 DIAGNOSIS — D500 Iron deficiency anemia secondary to blood loss (chronic): Secondary | ICD-10-CM

## 2018-09-21 DIAGNOSIS — Z9884 Bariatric surgery status: Secondary | ICD-10-CM | POA: Diagnosis not present

## 2018-09-21 LAB — VITAMIN B12: Vitamin B-12: 842 pg/mL (ref 180–914)

## 2018-09-21 LAB — COMPREHENSIVE METABOLIC PANEL
ALT: 27 U/L (ref 0–44)
ANION GAP: 7 (ref 5–15)
AST: 22 U/L (ref 15–41)
Albumin: 3 g/dL — ABNORMAL LOW (ref 3.5–5.0)
Alkaline Phosphatase: 66 U/L (ref 38–126)
BUN: 15 mg/dL (ref 8–23)
CO2: 26 mmol/L (ref 22–32)
Calcium: 8.1 mg/dL — ABNORMAL LOW (ref 8.9–10.3)
Chloride: 106 mmol/L (ref 98–111)
Creatinine, Ser: 0.96 mg/dL (ref 0.44–1.00)
GFR calc Af Amer: >60 mL/min (ref >60)
GFR calc non Af Amer: >60 mL/min (ref >60)
Glucose, Bld: 101 mg/dL — ABNORMAL HIGH (ref 70–99)
POTASSIUM: 4.4 mmol/L (ref 3.5–5.1)
Sodium: 139 mmol/L (ref 135–145)
Total Bilirubin: 0.6 mg/dL (ref 0.3–1.2)
Total Protein: 5.3 g/dL — ABNORMAL LOW (ref 6.5–8.1)

## 2018-09-21 LAB — FERRITIN: Ferritin: 190 ng/mL (ref 11–307)

## 2018-09-21 LAB — CBC WITH DIFFERENTIAL/PLATELET
ABS IMMATURE GRANULOCYTES: 0.01 10*3/uL (ref 0.00–0.07)
Basophils Absolute: 0 10*3/uL (ref 0.0–0.1)
Basophils Relative: 1 %
EOS PCT: 3 %
Eosinophils Absolute: 0.2 10*3/uL (ref 0.0–0.5)
HEMATOCRIT: 43.2 % (ref 36.0–46.0)
HEMOGLOBIN: 13.5 g/dL (ref 12.0–15.0)
IMMATURE GRANULOCYTES: 0 %
Lymphocytes Relative: 22 %
Lymphs Abs: 1.4 10*3/uL (ref 0.7–4.0)
MCH: 31.9 pg (ref 26.0–34.0)
MCHC: 31.3 g/dL (ref 30.0–36.0)
MCV: 102.1 fL — ABNORMAL HIGH (ref 80.0–100.0)
MONO ABS: 0.5 10*3/uL (ref 0.1–1.0)
MONOS PCT: 8 %
NEUTROS ABS: 4.3 10*3/uL (ref 1.7–7.7)
Neutrophils Relative %: 66 %
Platelets: 205 10*3/uL (ref 150–400)
RBC: 4.23 MIL/uL (ref 3.87–5.11)
RDW: 12.7 % (ref 11.5–15.5)
WBC: 6.4 10*3/uL (ref 4.0–10.5)
nRBC: 0 % (ref 0.0–0.2)

## 2018-09-21 LAB — FOLATE: Folate: 13.7 ng/mL (ref >5.9)

## 2018-09-21 LAB — IRON AND TIBC
IRON: 101 ug/dL (ref 28–170)
SATURATION RATIOS: 46 % — AB (ref 10.4–31.8)
TIBC: 218 ug/dL — AB (ref 250–450)
UIBC: 117 ug/dL

## 2018-09-21 NOTE — Telephone Encounter (Signed)
Patient called stating she had an ultrasound guided right thumb injection approximately a year ago and would like to schedule another appointment to have injection.  Patient requested a return call before scheduling the appointment.

## 2018-09-23 ENCOUNTER — Inpatient Hospital Stay (HOSPITAL_COMMUNITY): Payer: Medicare Other | Attending: Hematology | Admitting: Hematology

## 2018-09-23 ENCOUNTER — Encounter (HOSPITAL_COMMUNITY): Payer: Self-pay | Admitting: Hematology

## 2018-09-23 ENCOUNTER — Other Ambulatory Visit: Payer: Self-pay

## 2018-09-23 VITALS — BP 109/65 | HR 75 | Temp 97.7°F | Resp 16 | Wt 162.0 lb

## 2018-09-23 DIAGNOSIS — D508 Other iron deficiency anemias: Secondary | ICD-10-CM | POA: Insufficient documentation

## 2018-09-23 DIAGNOSIS — I890 Lymphedema, not elsewhere classified: Secondary | ICD-10-CM | POA: Diagnosis not present

## 2018-09-23 DIAGNOSIS — D500 Iron deficiency anemia secondary to blood loss (chronic): Secondary | ICD-10-CM

## 2018-09-23 DIAGNOSIS — K909 Intestinal malabsorption, unspecified: Secondary | ICD-10-CM | POA: Insufficient documentation

## 2018-09-23 DIAGNOSIS — Z9884 Bariatric surgery status: Secondary | ICD-10-CM | POA: Diagnosis not present

## 2018-09-23 DIAGNOSIS — K5900 Constipation, unspecified: Secondary | ICD-10-CM

## 2018-09-23 DIAGNOSIS — R770 Abnormality of albumin: Secondary | ICD-10-CM

## 2018-09-23 NOTE — Progress Notes (Signed)
[place], [place] [address]   CLINIC:  Medical Oncology/Hematology  PCP:  Rubye Beach [address] [place] [address] [phone]   REASON FOR VISIT: Follow-up for iron deficiency anemia  CURRENT THERAPY: intermittent feraheme infusions   INTERVAL HISTORY:  [name] 68 y.o. female returns for routine follow-up for iron deficiency anemia. She is here today with her husband and doing well. She is still having fatigue throughout the day. She also struggles with constipation issues. Denies any nausea, vomiting, or diarrhea. Denies any new pains. Had not noticed any recent bleeding such as epistaxis, hematuria or hematochezia. Denies recent chest pain on exertion, shortness of breath on minimal exertion, pre-syncopal episodes, or palpitations. Denies any numbness or tingling in hands or feet. Denies any recent fevers, infections, or recent hospitalizations. She reports her appetite is at 25%.    REVIEW OF SYSTEMS:  Review of Systems  Constitutional: Positive for fatigue.  Gastrointestinal: Positive for constipation.  All other systems reviewed and are negative.    PAST MEDICAL/SURGICAL HISTORY:  Past Medical History:  Diagnosis Date  . Asthma   . GERD (gastroesophageal reflux disease)   . Internal hemorrhoids   . Iron deficiency anemia   . Osteoporosis   . Peptic ulcer   . Tubular adenoma of colon    Past Surgical History:  Procedure Laterality Date  . BACK SURGERY    . BREAST SURGERY    . CHOLECYSTECTOMY    . COLONOSCOPY WITH PROPOFOL N/A 06/02/2017   Procedure: COLONOSCOPY WITH PROPOFOL;  Surgeon: Jonathon Bellows, MD;  Location: [facility] ENDOSCOPY;  Service: Gastroenterology;  Laterality: N/A;  . ESOPHAGOGASTRODUODENOSCOPY (EGD) WITH PROPOFOL N/A 07/01/2017   Procedure: ESOPHAGOGASTRODUODENOSCOPY (EGD) WITH PROPOFOL;  Surgeon: Jonathon Bellows, MD;  Location: [facility] ENDOSCOPY;  Service: Gastroenterology;   Laterality: N/A;  . FLEXIBLE SIGMOIDOSCOPY N/A 07/01/2017   Procedure: FLEXIBLE SIGMOIDOSCOPY;  Surgeon: Jonathon Bellows, MD;  Location: [facility] ENDOSCOPY;  Service: Gastroenterology;  Laterality: N/A;  . KNEE SURGERY    . mini gastric bypass    . SHOULDER SURGERY    . TONSILLECTOMY       SOCIAL HISTORY:  Social History   Socioeconomic History  . Marital status: Married    Spouse name: Not on file  . Number of children: Not on file  . Years of education: Not on file  . Highest education level: Not on file  Occupational History  . Not on file  Social Needs  . Financial resource strain: Not on file  . Food insecurity:    Worry: Not on file    Inability: Not on file  . Transportation needs:    Medical: Not on file    Non-medical: Not on file  Tobacco Use  . Smoking status: Never Smoker  . Smokeless tobacco: Never Used  Substance and Sexual Activity  . Alcohol use: No    Alcohol/week: 0.0 standard drinks  . Drug use: Never  . Sexual activity: Never  Lifestyle  . Physical activity:    Days per week: Not on file    Minutes per session: Not on file  . Stress: Not on file  Relationships  . Social connections:    Talks on phone: Not on file    Gets together: Not on file    Attends religious service: Not on file    Active member of club or organization: Not on file    Attends meetings of clubs or organizations: Not on  file    Relationship status: Not on file  . Intimate partner violence:    Fear of current or ex partner: Not on file    Emotionally abused: Not on file    Physically abused: Not on file    Forced sexual activity: Not on file  Other Topics Concern  . Not on file  Social History Narrative  . Not on file    FAMILY HISTORY:  Family History  Problem Relation Age of Onset  . Hypertension Other   . Stroke Other   . Diabetes Other   . Heart attack Other   . Obesity Other   . Diabetes Father   . Heart disease Father   . Colon cancer Father   . Breast cancer  Paternal Grandmother   . Healthy Daughter   . Stomach cancer Neg Hx   . Pancreatic cancer Neg Hx     CURRENT MEDICATIONS:  Outpatient Encounter Medications as of 09/23/2018  Medication Sig  . calcitRIOL (ROCALTROL) 0.5 MCG capsule Take 0.5 mcg by mouth daily.  . Cholecalciferol (VITAMIN D3) 5000 UNITS CAPS Take 1 capsule by mouth daily.   . Melatonin 5 MG CAPS Take by mouth as needed.   . Multiple Vitamin (MULTIVITAMIN) tablet Take 1 tablet by mouth 2 (two) times daily.   . pantoprazole (PROTONIX) 40 MG tablet Take 40 mg by mouth every morning.  Marland Kitchen VITAMIN A PO Take 1 capsule by mouth daily.   Marland Kitchen VITAMIN E PO Take 1 capsule by mouth daily.    No facility-administered encounter medications on file as of 09/23/2018.     ALLERGIES:  Allergies  Allergen Reactions  . Lac Bovis Diarrhea  . Prednisone Diarrhea  . Sulfa Antibiotics Itching  . Tape Dermatitis  . Zithromax [Azithromycin] Diarrhea     PHYSICAL EXAM:  ECOG Performance status: 1  Vitals:   09/23/18 1029  BP: 109/65  Pulse: 75  Resp: 16  Temp: 97.7 F (36.5 C)  SpO2: 99%   Filed Weights   09/23/18 1029  Weight: 162 lb (73.5 kg)    Physical Exam Constitutional:      Appearance: Normal appearance. She is normal weight.  Musculoskeletal: Normal range of motion.  Skin:    General: Skin is warm and dry.  Neurological:     Mental Status: She is alert and oriented to person, place, and time. Mental status is at baseline.  Psychiatric:        Mood and Affect: Mood normal.        Behavior: Behavior normal.        Thought Content: Thought content normal.        Judgment: Judgment normal.      LABORATORY DATA:  I have reviewed the labs as listed.  CBC    Component Value Date/Time   WBC 6.4 09/21/2018 1321   RBC 4.23 09/21/2018 1321   HGB 13.5 09/21/2018 1321   HGB 10.8 (L) 09/25/2016 1518   HCT 43.2 09/21/2018 1321   HCT 32.6 (L) 09/25/2016 1518   PLT 205 09/21/2018 1321   PLT 264 09/25/2016 1518    MCV 102.1 (H) 09/21/2018 1321   MCV 96 09/25/2016 1518   MCH 31.9 09/21/2018 1321   MCHC 31.3 09/21/2018 1321   RDW 12.7 09/21/2018 1321   RDW 15.5 (H) 09/25/2016 1518   LYMPHSABS 1.4 09/21/2018 1321   LYMPHSABS 1.2 09/25/2016 1518   MONOABS 0.5 09/21/2018 1321   EOSABS 0.2 09/21/2018 1321   EOSABS 0.1 09/25/2016  1518   BASOSABS 0.0 09/21/2018 1321   BASOSABS 0.0 09/25/2016 1518   CMP Latest Ref Rng & Units 09/21/2018 07/15/2018 06/16/2018  Glucose 70 - 99 mg/dL 101(H) - 65(L)  BUN 8 - 23 mg/dL 15 - 12  Creatinine 0.44 - 1.00 mg/dL 0.96 - 0.75  Sodium 135 - 145 mmol/L 139 - 141  Potassium 3.5 - 5.1 mmol/L 4.4 - 4.0  Chloride 98 - 111 mmol/L 106 - 107  CO2 22 - 32 mmol/L 26 - 28  Calcium 8.9 - 10.3 mg/dL 8.1(L) - 8.1(L)  Total Protein 6.5 - 8.1 g/dL 5.3(L) 5.6(L) 5.6(L)  Total Bilirubin 0.3 - 1.2 mg/dL 0.6 0.9 1.0  Alkaline Phos 38 - 126 U/L 66 72 81  AST 15 - 41 U/L 22 21 80(H)  ALT 0 - 44 U/L 27 22 77(H)       DIAGNOSTIC IMAGING:  I have independently reviewed the scans and discussed with the patient.   I have reviewed Francene Finders, NP's note and agree with the documentation.  I personally performed a face-to-face visit, made revisions and my assessment and plan is as follows.    ASSESSMENT & PLAN:   Iron deficiency anemia due to chronic blood loss 1.  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  -She denies any bleeding per rectum or melena. -Her last colonoscopy was on 06/02/2017 at [facility] which showed small mouth diverticula in sigmoid colon with nonbleeding internal hemorrhoids. -Last Feraheme infusion was on 03/09/2018 and 03/21/2018. -She felt much better in terms of energy after last Feraheme infusions.  However she reports that her energy has been severely low in the last 3 weeks. -We have reviewed her blood work.  Ferritin was 190 and percent saturation was 46.  Hemoglobin was  normal. -However because of her severe tiredness, I have recommended one infusion of Feraheme to see if it helps.  She is agreeable.  We will schedule it next week.  I will see her back in 4 months for follow-up. .  2.  Elevated LFTs: -They were previously elevated and have normalized.  3.  Lower extremity lymphedema: - She reports worsening of her lymphedema.  She had chronic lymphedema for many years.  Will make referral to lymphedema clinic. -Low albumin is also likely exacerbating it.      Orders placed this encounter:  Orders Placed This Encounter  Procedures  . CBC with Differential/Platelet  . Comprehensive metabolic panel  . Ferritin  . Iron and TIBC  . Vitamin B12  . Folate  . Lactate dehydrogenase      Derek Jack, MD [place] [phone]

## 2018-09-23 NOTE — Patient Instructions (Signed)
[facility] at [facility] Discharge Instructions     Thank you for choosing [facility] at [facility] to provide your oncology and hematology care.  To afford each patient quality time with our provider, please arrive at least 15 minutes before your scheduled appointment time.   If you have a lab appointment with the Cancer Center please come in thru the  Main Entrance and check in at the main information desk  You need to re-schedule your appointment should you arrive 10 or more minutes late.  We strive to give you quality time with our providers, and arriving late affects you and other patients whose appointments are after yours.  Also, if you no show three or more times for appointments you may be dismissed from the clinic at the providers discretion.     Again, thank you for choosing [facility].  Our hope is that these requests will decrease the amount of time that you wait before being seen by our physicians.       _____________________________________________________________  Should you have questions after your visit to [facility], please contact our office at [phone] between the hours of 8:00 a.m. and 4:30 p.m.  Voicemails left after 4:00 p.m. will not be returned until the following business day.  For prescription refill requests, have your pharmacy contact our office and allow 72 hours.    Cancer Center Support Programs:   > Cancer Support Group  2nd Tuesday of the month 1pm-2pm, Journey Room    

## 2018-09-23 NOTE — Assessment & Plan Note (Signed)
1.  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  -She denies any bleeding per rectum or melena. -Her last colonoscopy was on 06/02/2017 at [facility] which showed small mouth diverticula in sigmoid colon with nonbleeding internal hemorrhoids. -Last Feraheme infusion was on 03/09/2018 and 03/21/2018. -She felt much better in terms of energy after last Feraheme infusions.  However she reports that her energy has been severely low in the last 3 weeks. -We have reviewed her blood work.  Ferritin was 190 and percent saturation was 46.  Hemoglobin was normal. -However because of her severe tiredness, I have recommended one infusion of Feraheme to see if it helps.  She is agreeable.  We will schedule it next week.  I will see her back in 4 months for follow-up. .  2.  Elevated LFTs: -They were previously elevated and have normalized.  3.  Lower extremity lymphedema: - She reports worsening of her lymphedema.  She had chronic lymphedema for many years.  Will make referral to lymphedema clinic. -Low albumin is also likely exacerbating it.

## 2018-09-26 ENCOUNTER — Other Ambulatory Visit (HOSPITAL_COMMUNITY): Payer: Medicare Other

## 2018-09-26 NOTE — Telephone Encounter (Signed)
Offered patient an appointment on 09/28/18 at 1:00 pm. Patient declined appointment as she has an iron infusion at [facility]. Patient asked if it could be added to her appointment on 11/01/18. Noted to add it her appointment.

## 2018-09-27 ENCOUNTER — Telehealth (HOSPITAL_COMMUNITY): Payer: Self-pay | Admitting: Physician Assistant

## 2018-09-27 NOTE — Telephone Encounter (Signed)
14/20  I called patient to ask if she could come in at 2:30 instead of 1:45 and patient said that she could.

## 2018-09-28 ENCOUNTER — Ambulatory Visit (HOSPITAL_COMMUNITY): Payer: Medicare Other

## 2018-09-28 ENCOUNTER — Inpatient Hospital Stay (HOSPITAL_COMMUNITY): Payer: Medicare Other

## 2018-09-28 ENCOUNTER — Encounter (HOSPITAL_COMMUNITY): Payer: Self-pay

## 2018-09-28 VITALS — BP 108/63 | HR 68 | Temp 97.8°F | Resp 18

## 2018-09-28 DIAGNOSIS — K922 Gastrointestinal hemorrhage, unspecified: Secondary | ICD-10-CM

## 2018-09-28 DIAGNOSIS — D500 Iron deficiency anemia secondary to blood loss (chronic): Secondary | ICD-10-CM | POA: Diagnosis not present

## 2018-09-28 MED ORDER — SODIUM CHLORIDE 0.9 % IV SOLN
INTRAVENOUS | Status: DC
  Administered 2018-09-28: 09:00:00 via INTRAVENOUS

## 2018-09-28 MED ORDER — SODIUM CHLORIDE 0.9 % IV SOLN
510.0000 mg | Freq: Once | INTRAVENOUS | Status: AC
  Administered 2018-09-28: 510 mg via INTRAVENOUS
  Filled 2018-09-28: qty 17

## 2018-09-28 NOTE — Patient Instructions (Signed)
[facility] at [facility] Discharge Instructions  Received Feraheme infusion today. Follow-up as scheduled. Call clinic for any questions or concerns   Thank you for choosing [facility] at [facility] to provide your oncology and hematology care.  To afford each patient quality time with our provider, please arrive at least 15 minutes before your scheduled appointment time.   If you have a lab appointment with the Cancer Center please come in thru the  Main Entrance and check in at the main information desk  You need to re-schedule your appointment should you arrive 10 or more minutes late.  We strive to give you quality time with our providers, and arriving late affects you and other patients whose appointments are after yours.  Also, if you no show three or more times for appointments you may be dismissed from the clinic at the providers discretion.     Again, thank you for choosing [facility].  Our hope is that these requests will decrease the amount of time that you wait before being seen by our physicians.       _____________________________________________________________  Should you have questions after your visit to [facility], please contact our office at [phone] between the hours of 8:00 a.m. and 4:30 p.m.  Voicemails left after 4:00 p.m. will not be returned until the following business day.  For prescription refill requests, have your pharmacy contact our office and allow 72 hours.    Cancer Center Support Programs:   > Cancer Support Group  2nd Tuesday of the month 1pm-2pm, Journey Room   

## 2018-09-28 NOTE — Progress Notes (Signed)
[name] Martin[place] tolerated Feraheme infusion well without complaints or incident.VSS upon discharge. Pt discharged self ambulatory in satisfactory condition

## 2018-10-03 ENCOUNTER — Ambulatory Visit (HOSPITAL_COMMUNITY): Payer: Medicare Other | Admitting: Hematology

## 2018-10-06 ENCOUNTER — Other Ambulatory Visit: Payer: Self-pay

## 2018-10-06 ENCOUNTER — Ambulatory Visit (HOSPITAL_COMMUNITY): Payer: Medicare Other | Attending: Hematology | Admitting: Physical Therapy

## 2018-10-06 DIAGNOSIS — I890 Lymphedema, not elsewhere classified: Secondary | ICD-10-CM | POA: Diagnosis not present

## 2018-10-06 NOTE — Therapy (Signed)
[place], [place], [address] Phone: [phone]   Fax:  [phone]  Physical Therapy Evaluation  Patient Details  Name: [name] MRN: [id number] Date of Birth: March 12, 1951 Referring Provider (PT): Derek Jack   Encounter Date: 10/06/2018  PT End of Session - 10/06/18 1611    Visit Number  1    Number of Visits  6    Date for PT Re-Evaluation  11/05/18    Authorization Type  [employer] medicare    Authorization - Visit Number  1    Authorization - Number of Visits  6    PT Start Time  1430    PT Stop Time  1545    PT Time Calculation (min)  75 min    Activity Tolerance  Patient tolerated treatment well    Behavior During Therapy  [facility] for tasks assessed/performed       Past Medical History:  Diagnosis Date  . Asthma   . GERD (gastroesophageal reflux disease)   . Internal hemorrhoids   . Iron deficiency anemia   . Osteoporosis   . Peptic ulcer   . Tubular adenoma of colon     Past Surgical History:  Procedure Laterality Date  . BACK SURGERY    . BREAST SURGERY    . CHOLECYSTECTOMY    . COLONOSCOPY WITH PROPOFOL N/A 06/02/2017   Procedure: COLONOSCOPY WITH PROPOFOL;  Surgeon: Jonathon Bellows, MD;  Location: [facility] ENDOSCOPY;  Service: Gastroenterology;  Laterality: N/A;  . ESOPHAGOGASTRODUODENOSCOPY (EGD) WITH PROPOFOL N/A 07/01/2017   Procedure: ESOPHAGOGASTRODUODENOSCOPY (EGD) WITH PROPOFOL;  Surgeon: Jonathon Bellows, MD;  Location: [facility] ENDOSCOPY;  Service: Gastroenterology;  Laterality: N/A;  . FLEXIBLE SIGMOIDOSCOPY N/A 07/01/2017   Procedure: FLEXIBLE SIGMOIDOSCOPY;  Surgeon: Jonathon Bellows, MD;  Location: [facility] ENDOSCOPY;  Service: Gastroenterology;  Laterality: N/A;  . KNEE SURGERY    . mini gastric bypass    . SHOULDER SURGERY    . TONSILLECTOMY      There were no vitals filed for this visit.   Subjective Assessment - 10/06/18 1443    Subjective  Ms. Burns states that she was having  lymphedema treatment in [place] about two years ago.  [place] and did very well.  She initially was completing self lymph massages and using short stretch to bandage herself but due to extreme arthritis she is not longer able to complete either activity.  She is using a juxtafit garment for compression everyday but this is over 20 months old. She had a flare up of her swelling at Christmas and has been referred to this clinic.        Pertinent History  B LE lymphedema     Limitations  Walking;House hold activities;Sitting    How long can you sit comfortably?  no problem     Currently in Pain?  No/denies    Pain Score  2     Pain Location  Leg    Pain Orientation  Left;Right    Pain Descriptors / Indicators  Tightness    Pain Type  Chronic pain    Pain Onset  More than a month ago    Pain Frequency  Constant    Aggravating Factors   not sure     Pain Relieving Factors  compression wrap          [facility] PT Assessment - 10/06/18 0001      Assessment   Medical Diagnosis  B lymphedema    Referring Provider (PT)  Derek Jack    Onset Date/Surgical Date  02/13/16    Prior Therapy  yes OT for lymphedema       Prior Function   Level of Independence  Independent with basic ADLs    Vocation  Retired        [place] - 10/06/18 1443      What other symptoms do you have   Are you Having Heaviness or Tightness  Yes    Are you having Pain  No    Are you having pitting edema  No    Is it Hard or Difficult finding clothes that fit  Yes    Do you have infections  No    Is there Decreased scar mobility  No    Stemmer Sign  No      Lymphedema Stage   Stage  STAGE 2 SPONTANEOUSLY IRREVERSIBLE      Lymphedema Assessments   Lymphedema Assessments  Lower extremities      Right Lower Extremity Lymphedema   30 cm Proximal to Suprapatella  60 cm    20 cm Proximal to Suprapatella  58.7 cm    10 cm Proximal to  Suprapatella  51 cm    At Midpatella/Popliteal Crease  44 cm    30 cm Proximal to Floor at Lateral Plantar Foot  40.7 cm    20 cm Proximal to Floor at Lateral Plantar Foot  36.3 1    10  cm Proximal to Floor at Lateral Malleoli  28 cm    Circumference of ankle/heel  33.5 cm.    5 cm Proximal to 1st MTP Joint  22.6 cm    Across MTP Joint  22.5 cm    Other  --      Left Lower Extremity Lymphedema   30 cm Proximal to Suprapatella  62 cm    20 cm Proximal to Suprapatella  57 cm    10 cm Proximal to Suprapatella  51 cm    At Midpatella/Popliteal Crease  42.5 cm    30 cm Proximal to Floor at Lateral Plantar Foot  41.3 cm    20 cm Proximal to Floor at Lateral Plantar Foot  35 cm    10 cm Proximal to Floor at Lateral Malleoli  28 cm    Circumference of ankle/heel  34.2 cm.    5 cm Proximal to 1st MTP Joint  22.8 cm    Across MTP Joint  21.7 cm             Objective measurements completed on examination: See above findings.      [facility] Adult PT Treatment/Exercise - 10/06/18 0001      Manual Therapy   Manual Therapy  Edema management    Manual therapy comments  completed seperate from all other aspects     Manual Lymphatic Drainage (MLD)  To include supraclavicular, deep and superfical abdominal, inguinal/axillary anastomosis and B LE completed both anterior and posterior.y     Compression Bandaging  Donning of juxtafit.             PT Education - 10/06/18 1610    Education Details  LE exercises to promote lymphatic flow; Reviewed Lymphedema and it's chronic nature.     Person(s) Educated  Patient    Methods  Explanation    Comprehension  Verbalized understanding       PT Short Term Goals - 10/06/18 1620  PT SHORT TERM GOAL #1   Title  PT volume to have decreased by 2cm to allow ease of donning shoes.     Time  2    Period  Weeks    Status  New      PT SHORT TERM GOAL #2   Title  PT to have acquired a new juxtafit to have proper compression to decrease risk  of flare up.     Time  2    Period  Weeks    Status  New      PT SHORT TERM GOAL #3   Title  PT to have acquired and be using compression pump on a daily basis.     Time  2    Period  Weeks    Status  New                Plan - 10/06/18 1614    Clinical Impression Statement  Ms. Burns is a pt known to have B LE lymphedema.  She wears her juxtafit daily but has been in a flare up since late December.  She currently does not have a pump nor is she completing any exercises.  Ms. Burns will benefit from skilled physical therapy to reduce her volume by manual decongestive techniques,  acquire a new juxtafit garment and a pump to control her edema and reduce her risk of cellulitis.     History and Personal Factors relevant to plan of care:  OA; gastroc bypass with skin removal     Clinical Presentation  Stable    Clinical Decision Making  Low    Rehab Potential  Good    PT Frequency  3x / week    PT Duration  3 weeks    PT Treatment/Interventions  ADLs/Self Care Home Management;Compression bandaging;Manual lymph drainage    PT Next Visit Plan  Check on pump/garment order which has been faxed.  Continue with manual lymph drainage to B LE with focus on ankle area.     PT Home Exercise Plan  ankle pump, marching, LAQ, hip ab/adduction, hip IR/ER       Patient will benefit from skilled therapeutic intervention in order to improve the following deficits and impairments:  Increased edema, Decreased skin integrity  Visit Diagnosis: Lymphedema, not elsewhere classified     Problem List Patient Active Problem List   Diagnosis Date Noted  . Left-sided weakness   . Stroke-like symptoms 02/24/2018  . HTN (hypertension) 02/24/2018  . Primary osteoarthritis of both hands 11/03/2017  . Dupuytren's contracture of left hand 11/03/2017  . DDD (degenerative disc disease), lumbar 11/03/2017  . History of gastric bypass 11/03/2017  . Lymphedema 09/17/2017  . History of vertebral fracture  09/17/2017  . History of osteoporosis 09/17/2017  . Iron deficiency anemia due to chronic blood loss 06/28/2017  . Lower GI bleed 05/31/2017  . Rectal mass 05/31/2017  . Asthma 05/31/2017  . GERD (gastroesophageal reflux disease) 05/31/2017  . Closed fracture of upper end of humerus 09/23/2016  . Tendinitis 09/23/2016  . Patulous eustachian tube of right ear 11/07/2015  . 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 Vitamin D deficiency 08/20/2014  . Secondary hyperparathyroidism [facility]) 08/20/2014    [name], PT CLT [phone] 10/06/2018, 4:23 PM  [place] [address] [place], [place], [address] Phone: [phone]   Fax:  [phone]  Name: Vasti Ryals Burns MRN: [id number] Date of Birth: 1951/05/15

## 2018-10-07 ENCOUNTER — Ambulatory Visit (HOSPITAL_COMMUNITY): Payer: Medicare Other | Admitting: Physical Therapy

## 2018-10-07 DIAGNOSIS — I890 Lymphedema, not elsewhere classified: Secondary | ICD-10-CM | POA: Diagnosis not present

## 2018-10-07 NOTE — Therapy (Signed)
[place], [place], [address] Phone: [phone]   Fax:  [phone]  Physical Therapy Treatment  Patient Details  Name: [name] MRN: [id number] Date of Birth: 08/08/1951 Referring Provider (PT): Derek Jack   Encounter Date: 10/07/2018  PT End of Session - 10/07/18 0903    Visit Number  2    Number of Visits  6    Date for PT Re-Evaluation  11/05/18    Authorization Type  UHC medicare    Authorization - Visit Number  2    Authorization - Number of Visits  6    PT Start Time  [phone]    PT Stop Time  0900    PT Time Calculation (min)  41 min    Activity Tolerance  Patient tolerated treatment well    Behavior During Therapy  [facility] for tasks assessed/performed       Past Medical History:  Diagnosis Date  . Asthma   . GERD (gastroesophageal reflux disease)   . Internal hemorrhoids   . Iron deficiency anemia   . Osteoporosis   . Peptic ulcer   . Tubular adenoma of colon     Past Surgical History:  Procedure Laterality Date  . BACK SURGERY    . BREAST SURGERY    . CHOLECYSTECTOMY    . COLONOSCOPY WITH PROPOFOL N/A 06/02/2017   Procedure: COLONOSCOPY WITH PROPOFOL;  Surgeon: Jonathon Bellows, MD;  Location: [facility] ENDOSCOPY;  Service: Gastroenterology;  Laterality: N/A;  . ESOPHAGOGASTRODUODENOSCOPY (EGD) WITH PROPOFOL N/A 07/01/2017   Procedure: ESOPHAGOGASTRODUODENOSCOPY (EGD) WITH PROPOFOL;  Surgeon: Jonathon Bellows, MD;  Location: [facility] ENDOSCOPY;  Service: Gastroenterology;  Laterality: N/A;  . FLEXIBLE SIGMOIDOSCOPY N/A 07/01/2017   Procedure: FLEXIBLE SIGMOIDOSCOPY;  Surgeon: Jonathon Bellows, MD;  Location: [facility] ENDOSCOPY;  Service: Gastroenterology;  Laterality: N/A;  . KNEE SURGERY    . mini gastric bypass    . SHOULDER SURGERY    . TONSILLECTOMY      There were no vitals filed for this visit.  Subjective Assessment - 10/07/18 0820    Subjective  Pt states that she did not sleep well last  night; no pain    Pertinent History  B LE lymphedema     Limitations  Walking;House hold activities;Sitting    How long can you sit comfortably?  no problem     Currently in Pain?  No/denies    Pain Onset  More than a month ago                       [facility] Adult PT Treatment/Exercise - 10/07/18 0001      Manual Therapy   Manual Therapy  Edema management    Manual therapy comments  completed seperate from all other aspects     Manual Lymphatic Drainage (MLD)  To include supraclavicular, deep and superfical abdominal, inguinal/axillary anastomosis and B LE completed both anterior and posterior.y     Compression Bandaging  --               PT Short Term Goals - 10/07/18 0904      PT SHORT TERM GOAL #1   Title  PT volume to have decreased by 2cm to allow ease of donning shoes.     Time  2    Period  Weeks    Status  On-going      PT SHORT TERM GOAL #2   Title  PT to have acquired  a new juxtafit to have proper compression to decrease risk of flare up.     Time  2    Period  Weeks    Status  On-going      PT SHORT TERM GOAL #3   Title  PT to have acquired and be using compression pump on a daily basis.     Time  2    Period  Weeks    Status  On-going               Plan - 10/07/18 7035    Clinical Impression Statement  PTmain induration is in B ankle and LE.  Slight edema noted in thigh area.  Awaiting order for new juxtafit and pump.     Rehab Potential  Good    PT Frequency  3x / week    PT Duration  3 weeks    PT Treatment/Interventions  ADLs/Self Care Home Management;Compression bandaging;Manual lymph drainage    PT Next Visit Plan  Check on pump/garment order which has been faxed.  Continue with manual lymph drainage to B LE with focus on ankle area.     PT Home Exercise Plan  ankle pump, marching, LAQ, hip ab/adduction, hip IR/ER       Patient will benefit from skilled therapeutic intervention in order to improve the following deficits  and impairments:  Increased edema, Decreased skin integrity  Visit Diagnosis: Lymphedema, not elsewhere classified     Problem List Patient Active Problem List   Diagnosis Date Noted  . Left-sided weakness   . Stroke-like symptoms 02/24/2018  . HTN (hypertension) 02/24/2018  . Primary osteoarthritis of both hands 11/03/2017  . Dupuytren's contracture of left hand 11/03/2017  . DDD (degenerative disc disease), lumbar 11/03/2017  . History of gastric bypass 11/03/2017  . Lymphedema 09/17/2017  . History of vertebral fracture 09/17/2017  . History of osteoporosis 09/17/2017  . Iron deficiency anemia due to chronic blood loss 06/28/2017  . Lower GI bleed 05/31/2017  . Rectal mass 05/31/2017  . Asthma 05/31/2017  . GERD (gastroesophageal reflux disease) 05/31/2017  . Closed fracture of upper end of humerus 09/23/2016  . Tendinitis 09/23/2016  . Patulous eustachian tube of right ear 11/07/2015  . 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 Vitamin D deficiency 08/20/2014  . Secondary hyperparathyroidism [facility]) 08/20/2014    [name], PT CLT [phone] 10/07/2018, 9:05 AM  [place] [address] [place], [place], [address] Phone: [phone]   Fax:  [phone]  Name: [name] MRN: [id number] Date of Birth: 09-16-1950

## 2018-10-10 ENCOUNTER — Ambulatory Visit (HOSPITAL_COMMUNITY): Payer: Medicare Other | Admitting: Physical Therapy

## 2018-10-10 ENCOUNTER — Encounter (HOSPITAL_COMMUNITY): Payer: Self-pay | Admitting: Physical Therapy

## 2018-10-10 DIAGNOSIS — I890 Lymphedema, not elsewhere classified: Secondary | ICD-10-CM | POA: Diagnosis not present

## 2018-10-10 NOTE — Therapy (Signed)
[place], [place], [address] Phone: [phone]   Fax:  [phone]  Physical Therapy Treatment  Patient Details  Name: [name] MRN: [id number] Date of Birth: [birth year] Referring Provider (PT): Derek Jack   Encounter Date: 10/10/2018  PT End of Session - 10/10/18 1215    Visit Number  3    Number of Visits  6    Date for PT Re-Evaluation  11/05/18    Authorization Type  UHC medicare    Authorization - Visit Number  3    Authorization - Number of Visits  6    PT Start Time  1122    PT Stop Time  1208    PT Time Calculation (min)  46 min    Activity Tolerance  Patient tolerated treatment well    Behavior During Therapy  [facility] for tasks assessed/performed       Past Medical History:  Diagnosis Date  . Asthma   . GERD (gastroesophageal reflux disease)   . Internal hemorrhoids   . Iron deficiency anemia   . Osteoporosis   . Peptic ulcer   . Tubular adenoma of colon     Past Surgical History:  Procedure Laterality Date  . BACK SURGERY    . BREAST SURGERY    . CHOLECYSTECTOMY    . COLONOSCOPY WITH PROPOFOL N/A 06/02/2017   Procedure: COLONOSCOPY WITH PROPOFOL;  Surgeon: Jonathon Bellows, MD;  Location: [facility] ENDOSCOPY;  Service: Gastroenterology;  Laterality: N/A;  . ESOPHAGOGASTRODUODENOSCOPY (EGD) WITH PROPOFOL N/A 07/01/2017   Procedure: ESOPHAGOGASTRODUODENOSCOPY (EGD) WITH PROPOFOL;  Surgeon: Jonathon Bellows, MD;  Location: [facility] ENDOSCOPY;  Service: Gastroenterology;  Laterality: N/A;  . FLEXIBLE SIGMOIDOSCOPY N/A 07/01/2017   Procedure: FLEXIBLE SIGMOIDOSCOPY;  Surgeon: Jonathon Bellows, MD;  Location: [facility] ENDOSCOPY;  Service: Gastroenterology;  Laterality: N/A;  . KNEE SURGERY    . mini gastric bypass    . SHOULDER SURGERY    . TONSILLECTOMY      There were no vitals filed for this visit.  Subjective Assessment - 10/10/18 1213    Subjective  Pt states that she just can't seem to get her  ankles down     Pertinent History  B LE lymphedema     Limitations  Walking;House hold activities;Sitting    How long can you sit comfortably?  no problem     Currently in Pain?  No/denies    Pain Onset  More than a month ago                       [facility] Adult PT Treatment/Exercise - 10/10/18 0001      Manual Therapy   Manual Therapy  Edema management    Manual therapy comments  completed seperate from all other aspects     Edema Management  foam cut for ankles     Manual Lymphatic Drainage (MLD)  To include supraclavicular, deep and superfical abdominal, inguinal/axillary anastomosis and B LE completed both anterior and posterior.y     Compression Bandaging  using 1/2" foam at ankles and 8cm short stretch bandage followed by juxta fit              PT Education - 10/10/18 1215    Education Details  take foam off if there is any discomfort.     Person(s) Educated  Patient    Methods  Explanation    Comprehension  Verbalized understanding       PT  Short Term Goals - 10/07/18 0904      PT SHORT TERM GOAL #1   Title  PT volume to have decreased by 2cm to allow ease of donning shoes.     Time  2    Period  Weeks    Status  On-going      PT SHORT TERM GOAL #2   Title  PT to have acquired a new juxtafit to have proper compression to decrease risk of flare up.     Time  2    Period  Weeks    Status  On-going      PT SHORT TERM GOAL #3   Title  PT to have acquired and be using compression pump on a daily basis.     Time  2    Period  Weeks    Status  On-going               Plan - 10/10/18 1215    Clinical Impression Statement  Therapist added 1/2 inch foam with 8cm compression bandaging with juxtafit to attempt to decreased ankle edema.     Rehab Potential  Good    PT Frequency  3x / week    PT Duration  3 weeks    PT Treatment/Interventions  ADLs/Self Care Home Management;Compression bandaging;Manual lymph drainage    PT Next Visit Plan   Check on pump/garment order which has been faxed.  Continue with manual lymph drainage to B LE with focus on ankle area. Assess how foam at ankle did.   Measure next visit     PT Home Exercise Plan  ankle pump, marching, LAQ, hip ab/adduction, hip IR/ER       Patient will benefit from skilled therapeutic intervention in order to improve the following deficits and impairments:  Increased edema, Decreased skin integrity  Visit Diagnosis: Lymphedema, not elsewhere classified     Problem List Patient Active Problem List   Diagnosis Date Noted  . Left-sided weakness   . Stroke-like symptoms 02/24/2018  . HTN (hypertension) 02/24/2018  . Primary osteoarthritis of both hands 11/03/2017  . Dupuytren's contracture of left hand 11/03/2017  . DDD (degenerative disc disease), lumbar 11/03/2017  . History of gastric bypass 11/03/2017  . Lymphedema 09/17/2017  . History of vertebral fracture 09/17/2017  . History of osteoporosis 09/17/2017  . Iron deficiency anemia due to chronic blood loss 06/28/2017  . Lower GI bleed 05/31/2017  . Rectal mass 05/31/2017  . Asthma 05/31/2017  . GERD (gastroesophageal reflux disease) 05/31/2017  . Closed fracture of upper end of humerus 09/23/2016  . Tendinitis 09/23/2016  . Patulous eustachian tube of right ear 11/07/2015  . 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 Vitamin D deficiency 08/20/2014  . Secondary hyperparathyroidism [facility]) 08/20/2014    [name], PT CLT [phone] 10/10/2018, 12:17 PM  [place] [address] [place], [place], [address] Phone: [phone]   Fax:  [phone]  Name: [name] Burns MRN: [id number] Date of Birth: 1951/04/13

## 2018-10-12 ENCOUNTER — Ambulatory Visit (HOSPITAL_COMMUNITY): Payer: Medicare Other | Admitting: Physical Therapy

## 2018-10-12 DIAGNOSIS — I890 Lymphedema, not elsewhere classified: Secondary | ICD-10-CM | POA: Diagnosis not present

## 2018-10-12 NOTE — Therapy (Signed)
[place], [place], [address] Phone: [phone]   Fax:  [phone]  Physical Therapy Treatment  Patient Details  Name: [name] MRN: [id number] Date of Birth: 02/19/1951 Referring Provider (PT): Derek Jack   Encounter Date: 10/12/2018  PT End of Session - 10/12/18 1144    Visit Number  4    Number of Visits  6    Date for PT Re-Evaluation  11/05/18    Authorization Type  [employer]HC medicare    Authorization - Visit Number  4    Authorization - Number of Visits  6    PT Start Time  0906    PT Stop Time  1005    PT Time Calculation (min)  59 min    Activity Tolerance  Patient tolerated treatment well    Behavior During Therapy  [facility] for tasks assessed/performed       Past Medical History:  Diagnosis Date  . Asthma   . GERD (gastroesophageal reflux disease)   . Internal hemorrhoids   . Iron deficiency anemia   . Osteoporosis   . Peptic ulcer   . Tubular adenoma of colon     Past Surgical History:  Procedure Laterality Date  . BACK SURGERY    . BREAST SURGERY    . CHOLECYSTECTOMY    . COLONOSCOPY WITH PROPOFOL N/A 06/02/2017   Procedure: COLONOSCOPY WITH PROPOFOL;  Surgeon: Jonathon Bellows, MD;  Location: [facility] ENDOSCOPY;  Service: Gastroenterology;  Laterality: N/A;  . ESOPHAGOGASTRODUODENOSCOPY (EGD) WITH PROPOFOL N/A 07/01/2017   Procedure: ESOPHAGOGASTRODUODENOSCOPY (EGD) WITH PROPOFOL;  Surgeon: Jonathon Bellows, MD;  Location: [facility] ENDOSCOPY;  Service: Gastroenterology;  Laterality: N/A;  . FLEXIBLE SIGMOIDOSCOPY N/A 07/01/2017   Procedure: FLEXIBLE SIGMOIDOSCOPY;  Surgeon: Jonathon Bellows, MD;  Location: [facility] ENDOSCOPY;  Service: Gastroenterology;  Laterality: N/A;  . KNEE SURGERY    . mini gastric bypass    . SHOULDER SURGERY    . TONSILLECTOMY      There were no vitals filed for this visit.  Subjective Assessment - 10/12/18 1138    Subjective  Pt states she was able to keep bandages on  without problems, removed them last night.    Currently in Pain?  No/denies                       [facility] Adult PT Treatment/Exercise - 10/12/18 0001      Manual Therapy   Manual Therapy  Edema management    Manual therapy comments  completed seperate from all other aspects     Manual Lymphatic Drainage (MLD)  To include supraclavicular, deep and superfical abdominal, inguinal/axillary anastomosis and B LE completed both anterior and posterior.y     Compression Bandaging  using 1/2" foam at ankles and 8cm short stretch bandage followed by juxtalite               PT Short Term Goals - 10/07/18 0904      PT SHORT TERM GOAL #1   Title  PT volume to have decreased by 2cm to allow ease of donning shoes.     Time  2    Period  Weeks    Status  On-going      PT SHORT TERM GOAL #2   Title  PT to have acquired a new juxtafit to have proper compression to decrease risk of flare up.     Time  2    Period  Weeks  Status  On-going      PT SHORT TERM GOAL #3   Title  PT to have acquired and be using compression pump on a daily basis.     Time  2    Period  Weeks    Status  On-going               Plan - 10/12/18 1144    Clinical Impression Statement  contiued with manual lymph drainage f/b moisturizing and use of short stretch/existing juxtalite.  Educated more on difference between juxtafit/juxtalite, shown sock butler if husband could assist placing garments on butler for patient, thigh high hose could still be an option.  Added larger ankle foam pieces.  Refaxed pump/garment order to MD for signature.     Rehab Potential  Good    PT Frequency  3x / week    PT Duration  3 weeks    PT Treatment/Interventions  ADLs/Self Care Home Management;Compression bandaging;Manual lymph drainage    PT Next Visit Plan  Check on pump/garment order which has been faxed.  Continue with manual lymph drainage to B LE with focus on ankle area. Assess how larger foam at ankle  did.   Measure next visit     PT Home Exercise Plan  ankle pump, marching, LAQ, hip ab/adduction, hip IR/ER       Patient will benefit from skilled therapeutic intervention in order to improve the following deficits and impairments:  Increased edema, Decreased skin integrity  Visit Diagnosis: Lymphedema, not elsewhere classified     Problem List Patient Active Problem List   Diagnosis Date Noted  . Left-sided weakness   . Stroke-like symptoms 02/24/2018  . HTN (hypertension) 02/24/2018  . Primary osteoarthritis of both hands 11/03/2017  . Dupuytren's contracture of left hand 11/03/2017  . DDD (degenerative disc disease), lumbar 11/03/2017  . History of gastric bypass 11/03/2017  . Lymphedema 09/17/2017  . History of vertebral fracture 09/17/2017  . History of osteoporosis 09/17/2017  . Iron deficiency anemia due to chronic blood loss 06/28/2017  . Lower GI bleed 05/31/2017  . Rectal mass 05/31/2017  . Asthma 05/31/2017  . GERD (gastroesophageal reflux disease) 05/31/2017  . Closed fracture of upper end of humerus 09/23/2016  . Tendinitis 09/23/2016  . Patulous eustachian tube of right ear 11/07/2015  . 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 Vitamin D deficiency 08/20/2014  . Secondary hyperparathyroidism ([place]) 08/20/2014   [name], PTA/CLT [phone]  [name] 10/12/2018, 11:47 AM  [place] [address] [place], [place], [address] Phone: [phone]   Fax:  [phone]  Name: [name] MRN: [id number] Date of Birth: 1951/06/25

## 2018-10-14 ENCOUNTER — Ambulatory Visit (HOSPITAL_COMMUNITY): Payer: Medicare Other | Admitting: Physical Therapy

## 2018-10-14 DIAGNOSIS — I890 Lymphedema, not elsewhere classified: Secondary | ICD-10-CM

## 2018-10-14 NOTE — Therapy (Signed)
[place], [place], [address] Phone: [phone]   Fax:  [phone]  Physical Therapy Treatment  Patient Details  Name: [name] MRN: [id number] Date of Birth: 1950/12/03 Referring Provider (PT): Derek Jack   Encounter Date: 10/14/2018  PT End of Session - 10/14/18 1531    Visit Number  5    Number of Visits  12    Date for PT Re-Evaluation  11/05/18    Authorization Type  [employer]HC medicare    Authorization - Visit Number  5    Authorization - Number of Visits  12    PT Start Time  1125    PT Stop Time  1210    PT Time Calculation (min)  45 min    Activity Tolerance  Patient tolerated treatment well    Behavior During Therapy  [facility] for tasks assessed/performed       Past Medical History:  Diagnosis Date  . Asthma   . GERD (gastroesophageal reflux disease)   . Internal hemorrhoids   . Iron deficiency anemia   . Osteoporosis   . Peptic ulcer   . Tubular adenoma of colon     Past Surgical History:  Procedure Laterality Date  . BACK SURGERY    . BREAST SURGERY    . CHOLECYSTECTOMY    . COLONOSCOPY WITH PROPOFOL N/A 06/02/2017   Procedure: COLONOSCOPY WITH PROPOFOL;  Surgeon: Jonathon Bellows, MD;  Location: [facility] ENDOSCOPY;  Service: Gastroenterology;  Laterality: N/A;  . ESOPHAGOGASTRODUODENOSCOPY (EGD) WITH PROPOFOL N/A 07/01/2017   Procedure: ESOPHAGOGASTRODUODENOSCOPY (EGD) WITH PROPOFOL;  Surgeon: Jonathon Bellows, MD;  Location: [facility] ENDOSCOPY;  Service: Gastroenterology;  Laterality: N/A;  . FLEXIBLE SIGMOIDOSCOPY N/A 07/01/2017   Procedure: FLEXIBLE SIGMOIDOSCOPY;  Surgeon: Jonathon Bellows, MD;  Location: [facility] ENDOSCOPY;  Service: Gastroenterology;  Laterality: N/A;  . KNEE SURGERY    . mini gastric bypass    . SHOULDER SURGERY    . TONSILLECTOMY      There were no vitals filed for this visit.  Subjective Assessment - 10/14/18 1130    Subjective  Pt states that the foam was not wrapped  quite as tight last time which allowed her to be able to tolerate them longer.     Pertinent History  B LE lymphedema     Limitations  Walking;House hold activities;Sitting    How long can you sit comfortably?  no problem     Currently in Pain?  No/denies    Pain Onset  More than a month ago            LYMPHEDEMA/ONCOLOGY QUESTIONNAIRE - 10/14/18 1131      What other symptoms do you have   Are you Having Heaviness or Tightness  Yes  (Pended)     Are you having Pain  No  (Pended)     Are you having pitting edema  No  (Pended)     Is it Hard or Difficult finding clothes that fit  Yes  (Pended)     Do you have infections  No  (Pended)     Is there Decreased scar mobility  No  (Pended)     Stemmer Sign  No  (Pended)       Lymphedema Stage   Stage  STAGE 2 SPONTANEOUSLY IRREVERSIBLE  (Pended)       Lymphedema Assessments   Lymphedema Assessments  Lower extremities  (Pended)       Right Lower Extremity Lymphedema  30 cm Proximal to Suprapatella  60 cm  (Pended)     20 cm Proximal to Suprapatella  58.5 cm  (Pended)     10 cm Proximal to Suprapatella  50.3 cm  (Pended)     At Midpatella/Popliteal Crease  42.7 cm  (Pended)     30 cm Proximal to Floor at Lateral Plantar Foot  40 cm  (Pended)     20 cm Proximal to Floor at Lateral Plantar Foot  35 1  (Pended)     10 cm Proximal to Floor at Lateral Malleoli  27.3 cm  (Pended)     Circumference of ankle/heel  33.6 cm.  (Pended)     5 cm Proximal to 1st MTP Joint  22.2 cm  (Pended)     Across MTP Joint  22.3 cm  (Pended)       Left Lower Extremity Lymphedema   30 cm Proximal to Suprapatella  59 cm  (Pended)     20 cm Proximal to Suprapatella  56.5 cm  (Pended)     10 cm Proximal to Suprapatella  51.3 cm  (Pended)     At Midpatella/Popliteal Crease  42 cm  (Pended)     30 cm Proximal to Floor at Lateral Plantar Foot  39.5 cm  (Pended)     20 cm Proximal to Floor at Lateral Plantar Foot  34.5 cm  (Pended)     10 cm Proximal to  Floor at Lateral Malleoli  27.8 cm  (Pended)     Circumference of ankle/heel  33.8 cm.  (Pended)     5 cm Proximal to 1st MTP Joint  22.4 cm  (Pended)     Across MTP Joint  22 cm  (Pended)                 [facility] Adult PT Treatment/Exercise - 10/14/18 0001      Manual Therapy   Manual Therapy  Edema management    Manual therapy comments  completed seperate from all other aspects     Manual Lymphatic Drainage (MLD)  To include supraclavicular, deep and superfical abdominal, inguinal/axillary anastomosis and B LE completed both anterior and posterior.y     Compression Bandaging  using 1/2" foam at ankles and 8cm short stretch bandage followed by juxtalite               PT Short Term Goals - 10/07/18 0904      PT SHORT TERM GOAL #1   Title  PT volume to have decreased by 2cm to allow ease of donning shoes.     Time  2    Period  Weeks    Status  On-going      PT SHORT TERM GOAL #2   Title  PT to have acquired a new juxtafit to have proper compression to decrease risk of flare up.     Time  2    Period  Weeks    Status  On-going      PT SHORT TERM GOAL #3   Title  PT to have acquired and be using compression pump on a daily basis.     Time  2    Period  Weeks    Status  On-going               Plan - 10/14/18 1532    Clinical Impression Statement  PT remeasured today with overall reduction although reduction is going slower than therapist initially anticipated.  Explained  to pt that therapist feels she will benefit from another two weeks of treatment.  Pt agreed; order for pump and garment has not been recieved at this time.;     Rehab Potential  Good    PT Frequency  3x / week    PT Duration  3 weeks    PT Treatment/Interventions  ADLs/Self Care Home Management;Compression bandaging;Manual lymph drainage    PT Next Visit Plan  Check on pump/garment order which has been faxed.  Continue with manual lymph drainage to B LE with focus on ankle area. Assess  how larger foam at ankle did.   Measure next visit     PT Home Exercise Plan  ankle pump, marching, LAQ, hip ab/adduction, hip IR/ER       Patient will benefit from skilled therapeutic intervention in order to improve the following deficits and impairments:  Increased edema, Decreased skin integrity  Visit Diagnosis: Lymphedema, not elsewhere classified     Problem List Patient Active Problem List   Diagnosis Date Noted  . Left-sided weakness   . Stroke-like symptoms 02/24/2018  . HTN (hypertension) 02/24/2018  . Primary osteoarthritis of both hands 11/03/2017  . Dupuytren's contracture of left hand 11/03/2017  . DDD (degenerative disc disease), lumbar 11/03/2017  . History of gastric bypass 11/03/2017  . Lymphedema 09/17/2017  . History of vertebral fracture 09/17/2017  . History of osteoporosis 09/17/2017  . Iron deficiency anemia due to chronic blood loss 06/28/2017  . Lower GI bleed 05/31/2017  . Rectal mass 05/31/2017  . Asthma 05/31/2017  . GERD (gastroesophageal reflux disease) 05/31/2017  . Closed fracture of upper end of humerus 09/23/2016  . Tendinitis 09/23/2016  . Patulous eustachian tube of right ear 11/07/2015  . 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 Vitamin D deficiency 08/20/2014  . Secondary hyperparathyroidism [facility]) 08/20/2014   [name], PT CLT [phone] 10/14/2018, 3:36 PM  [place] [address] [place], [place], [address] Phone: [phone]   Fax:  [phone]  Name: [name] MRN: [id number] Date of Birth: 01/19/1951

## 2018-10-17 ENCOUNTER — Ambulatory Visit (HOSPITAL_COMMUNITY): Payer: Medicare Other | Attending: Hematology | Admitting: Physical Therapy

## 2018-10-17 ENCOUNTER — Telehealth (HOSPITAL_COMMUNITY): Payer: Self-pay | Admitting: Physical Therapy

## 2018-10-17 ENCOUNTER — Encounter

## 2018-10-17 DIAGNOSIS — I890 Lymphedema, not elsewhere classified: Secondary | ICD-10-CM | POA: Diagnosis not present

## 2018-10-17 NOTE — Telephone Encounter (Signed)
Signed order received from MD.  Order and demos faxed to [facility] for pump and to [facility] for juxtafit.  Called [name] to let her know she may call [facility] to make appt.  Given the number and contact Hillery Hunter) Teena Irani, PTA/CLT [phone]

## 2018-10-17 NOTE — Therapy (Signed)
[place], [place], [address] Phone: [phone]   Fax:  [phone]  Physical Therapy Treatment  Patient Details  Name: [name] MRN: [id number] Date of Birth: Sep 21, 1950 Referring Provider (PT): Derek Jack   Encounter Date: 10/17/2018  PT End of Session - 10/17/18 1457    Visit Number  6    Number of Visits  12    Date for PT Re-Evaluation  11/05/18    Authorization Type  [place] - Visit Number  6    Authorization - Number of Visits  12    PT Start Time  8546    PT Stop Time  1435    PT Time Calculation (min)  46 min       Past Medical History:  Diagnosis Date  . Asthma   . GERD (gastroesophageal reflux disease)   . Internal hemorrhoids   . Iron deficiency anemia   . Osteoporosis   . Peptic ulcer   . Tubular adenoma of colon     Past Surgical History:  Procedure Laterality Date  . BACK SURGERY    . BREAST SURGERY    . CHOLECYSTECTOMY    . COLONOSCOPY WITH PROPOFOL N/A 06/02/2017   Procedure: COLONOSCOPY WITH PROPOFOL;  Surgeon: Jonathon Bellows, MD;  Location: [facility] ENDOSCOPY;  Service: Gastroenterology;  Laterality: N/A;  . ESOPHAGOGASTRODUODENOSCOPY (EGD) WITH PROPOFOL N/A 07/01/2017   Procedure: ESOPHAGOGASTRODUODENOSCOPY (EGD) WITH PROPOFOL;  Surgeon: Jonathon Bellows, MD;  Location: [facility] ENDOSCOPY;  Service: Gastroenterology;  Laterality: N/A;  . FLEXIBLE SIGMOIDOSCOPY N/A 07/01/2017   Procedure: FLEXIBLE SIGMOIDOSCOPY;  Surgeon: Jonathon Bellows, MD;  Location: [facility] ENDOSCOPY;  Service: Gastroenterology;  Laterality: N/A;  . KNEE SURGERY    . mini gastric bypass    . SHOULDER SURGERY    . TONSILLECTOMY      There were no vitals filed for this visit.  Subjective Assessment - 10/17/18 1454    Subjective  pt states she is able to keep the bandaging on longer if her feet are not wrapped too much/too tight.  No other issues.  STill have not received signed order from  MD for new garment.     Currently in Pain?  No/denies                       [facility] Adult PT Treatment/Exercise - 10/17/18 0001      Manual Therapy   Manual Therapy  Edema management    Manual therapy comments  completed seperate from all other aspects     Manual Lymphatic Drainage (MLD)  To include supraclavicular, deep and superfical abdominal, inguinal/axillary anastomosis and B LE completed both anterior and posterior.y     Compression Bandaging  using 1/2" foam at ankles and 8cm short stretch bandage followed by juxtalite               PT Short Term Goals - 10/07/18 0904      PT SHORT TERM GOAL #1   Title  PT volume to have decreased by 2cm to allow ease of donning shoes.     Time  2    Period  Weeks    Status  On-going      PT SHORT TERM GOAL #2   Title  PT to have acquired a new juxtafit to have proper compression to decrease risk of flare up.     Time  2    Period  Weeks  Status  On-going      PT SHORT TERM GOAL #3   Title  PT to have acquired and be using compression pump on a daily basis.     Time  2    Period  Weeks    Status  On-going               Plan - 10/17/18 1457    Clinical Impression Statement  contiued with manual therapy and bandaging to ankles/use of juxtalites.   Contacted MD as order has been faxed several times without return.  To contact pateint when order received and fax to [facility] so she can make an appointment.     Rehab Potential  Good    PT Frequency  3x / week    PT Duration  3 weeks    PT Treatment/Interventions  ADLs/Self Care Home Management;Compression bandaging;Manual lymph drainage    PT Next Visit Plan  Check on pump/garment order which has been faxed.  Continue with manual lymph drainage to B LE with focus on ankle area.   Measure on Fridays.    PT Home Exercise Plan  ankle pump, marching, LAQ, hip ab/adduction, hip IR/ER       Patient will benefit from skilled therapeutic intervention in order to  improve the following deficits and impairments:  Increased edema, Decreased skin integrity  Visit Diagnosis: Lymphedema, not elsewhere classified     Problem List Patient Active Problem List   Diagnosis Date Noted  . Left-sided weakness   . Stroke-like symptoms 02/24/2018  . HTN (hypertension) 02/24/2018  . Primary osteoarthritis of both hands 11/03/2017  . Dupuytren's contracture of left hand 11/03/2017  . DDD (degenerative disc disease), lumbar 11/03/2017  . History of gastric bypass 11/03/2017  . Lymphedema 09/17/2017  . History of vertebral fracture 09/17/2017  . History of osteoporosis 09/17/2017  . Iron deficiency anemia due to chronic blood loss 06/28/2017  . Lower GI bleed 05/31/2017  . Rectal mass 05/31/2017  . Asthma 05/31/2017  . GERD (gastroesophageal reflux disease) 05/31/2017  . Closed fracture of upper end of humerus 09/23/2016  . Tendinitis 09/23/2016  . Patulous eustachian tube of right ear 11/07/2015  . 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 Vitamin D deficiency 08/20/2014  . Secondary hyperparathyroidism ([place]) 08/20/2014   Teena Irani, PTA/CLT [phone]  Teena Irani 10/17/2018, 3:00 PM  [facility] [address] [place], [place], [address] Phone: [phone]   Fax:  [phone]  Name: [name] MRN: [id number] Date of Birth: Mar 29, 1951

## 2018-10-18 NOTE — Progress Notes (Signed)
Office Visit Note  Patient: [name]             Date of Birth: 1951-08-12           MRN: [id number]             PCP: Mar Daring, PA-C Referring: Florian Buff* Visit Date: 11/01/2018 Occupation: @GUAROCC @  Subjective:  Right CMC joint pain   History of Present Illness: [name] is a 68 y.o. female with history osteoarthritis, DDD, and osteoporosis.  She reports she continues to have right [facility] joint pain.  She has been wearing a right CMC joint brace.  She would like a cortisone injection today.  She states she has been having more frequent hand cramps.  She reports she has been having increased pain in the left shoulder joint.  She states it has progressively been getting worse over the past 6 weeks.  She denies any injuries. She is unsure if she should follow up with Dr. Noemi Chapel.  She denies any other joint pain or joint swelling.   Activities of Daily Living:  Patient reports morning stiffness for 2 hours.   Patient Reports nocturnal pain.  Difficulty dressing/grooming: Denies Difficulty climbing stairs: Reports Difficulty getting out of chair: Reports Difficulty using hands for taps, buttons, cutlery, and/or writing: Reports  Review of Systems  Constitutional: Positive for fatigue.  HENT: Negative for mouth sores, mouth dryness and nose dryness.   Eyes: Negative for pain, visual disturbance and dryness.  Respiratory: Negative for cough, hemoptysis, shortness of breath and difficulty breathing.   Cardiovascular: Negative for chest pain, palpitations, hypertension and swelling in legs/feet.  Gastrointestinal: Negative for blood in stool, constipation and diarrhea.  Endocrine: Negative for increased urination.  Genitourinary: Negative for painful urination.  Musculoskeletal: Positive for arthralgias, joint pain and joint swelling. Negative for myalgias, muscle weakness, morning stiffness, muscle tenderness and myalgias.  Skin: Negative for  color change, pallor, rash, hair loss, nodules/bumps, skin tightness, ulcers and sensitivity to sunlight.  Allergic/Immunologic: Negative for susceptible to infections.  Neurological: Negative for dizziness, numbness, headaches and weakness.  Hematological: Negative for swollen glands.  Psychiatric/Behavioral: Negative for depressed mood and sleep disturbance. The patient is not nervous/anxious.     PMFS History:  Patient Active Problem List   Diagnosis Date Noted  . Left-sided weakness   . Stroke-like symptoms 02/24/2018  . HTN (hypertension) 02/24/2018  . Primary osteoarthritis of both hands 11/03/2017  . Dupuytren's contracture of left hand 11/03/2017  . DDD (degenerative disc disease), lumbar 11/03/2017  . History of gastric bypass 11/03/2017  . Lymphedema 09/17/2017  . History of vertebral fracture 09/17/2017  . History of osteoporosis 09/17/2017  . Iron deficiency anemia due to chronic blood loss 06/28/2017  . Lower GI bleed 05/31/2017  . Rectal mass 05/31/2017  . Asthma 05/31/2017  . GERD (gastroesophageal reflux disease) 05/31/2017  . Closed fracture of upper end of humerus 09/23/2016  . Tendinitis 09/23/2016  . Patulous eustachian tube of right ear 11/07/2015  . 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 Vitamin D deficiency 08/20/2014  . Secondary hyperparathyroidism ([place]) 08/20/2014    Past Medical History:  Diagnosis Date  . Asthma   . GERD (gastroesophageal reflux disease)   . Internal hemorrhoids   . Iron deficiency anemia   . Osteoporosis   . Peptic ulcer   . Tubular adenoma of colon     Family History  Problem Relation Age of Onset  . Hypertension Other   .  Stroke Other   . Diabetes Other   . Heart attack Other   . Obesity Other   . Diabetes Father   . Heart disease Father   . Colon cancer Father   . Breast cancer Paternal Grandmother   . Healthy Daughter   . Stomach cancer Neg Hx   . Pancreatic cancer Neg Hx    Past  Surgical History:  Procedure Laterality Date  . BACK SURGERY    . BREAST SURGERY    . CHOLECYSTECTOMY    . COLONOSCOPY WITH PROPOFOL N/A 06/02/2017   Procedure: COLONOSCOPY WITH PROPOFOL;  Surgeon: Jonathon Bellows, MD;  Location: [facility] ENDOSCOPY;  Service: Gastroenterology;  Laterality: N/A;  . ESOPHAGOGASTRODUODENOSCOPY (EGD) WITH PROPOFOL N/A 07/01/2017   Procedure: ESOPHAGOGASTRODUODENOSCOPY (EGD) WITH PROPOFOL;  Surgeon: Jonathon Bellows, MD;  Location: [facility] ENDOSCOPY;  Service: Gastroenterology;  Laterality: N/A;  . FLEXIBLE SIGMOIDOSCOPY N/A 07/01/2017   Procedure: FLEXIBLE SIGMOIDOSCOPY;  Surgeon: Jonathon Bellows, MD;  Location: [facility] ENDOSCOPY;  Service: Gastroenterology;  Laterality: N/A;  . KNEE SURGERY    . mini gastric bypass    . SHOULDER SURGERY    . TONSILLECTOMY     Social History   Social History Narrative  . Not on file    There is no immunization history on file for this patient.   Objective: Vital Signs: BP 127/79 (BP Location: Left Arm, Patient Position: Sitting, Cuff Size: Normal)   Pulse 66   Resp 13   Ht 5\' 3"  (1.6 m)   Wt 164 lb 6.4 oz (74.6 kg)   BMI 29.12 kg/m    Physical Exam Vitals signs and nursing note reviewed.  Constitutional:      Appearance: She is well-developed.  HENT:     Head: Normocephalic and atraumatic.  Eyes:     Conjunctiva/sclera: Conjunctivae normal.  Neck:     Musculoskeletal: Normal range of motion.  Cardiovascular:     Rate and Rhythm: Normal rate and regular rhythm.     Heart sounds: Normal heart sounds.  Pulmonary:     Effort: Pulmonary effort is normal.     Breath sounds: Normal breath sounds.  Abdominal:     General: Bowel sounds are normal.     Palpations: Abdomen is soft.  Lymphadenopathy:     Cervical: No cervical adenopathy.  Skin:    General: Skin is warm and dry.     Capillary Refill: Capillary refill takes less than 2 seconds.  Neurological:     Mental Status: She is alert and oriented to person, place, and time.    Psychiatric:        Behavior: Behavior normal.      Musculoskeletal Exam: Limited lateral rotation of the C-spine especially to the left. Right shoulder full ROM.  Left shoulder painful and slightly limited abduction.  Painful internal and external rotation of the left shoulder.  Limited and painful forward flexion.   Elbow joints and wrist joints good ROM.  Limited extension of PIP and DIP joints.  PIP and DIP synovial thickening consistent with osteoarthritis. Right CMC joint synovial thickening and tenderness.  Hip joints, knee joints, ankle joints, MTPs, PIPs, and DIPs good ROM with no synovitis.  No warmth or effusion of knee joints.  No tenderness or swelling of ankle joints.   CDAI Exam: CDAI Score: Not documented Patient Global Assessment: Not documented; Provider Global Assessment: Not documented Swollen: Not documented; Tender: Not documented Joint Exam   Not documented   There is currently no information documented on the  homunculus. Go to the Rheumatology activity and complete the homunculus joint exam.  Investigation: No additional findings.  Imaging: Xr Shoulder Left  Result Date: 11/01/2018 No humeral joint space narrowing was noted.  Inferior spurring was noted.  No chondrocalcinosis was noted.  Type II acromion was noted.   Recent Labs: Lab Results  Component Value Date   WBC 6.4 09/21/2018   HGB 13.5 09/21/2018   PLT 205 09/21/2018   NA 139 09/21/2018   K 4.4 09/21/2018   CL 106 09/21/2018   CO2 26 09/21/2018   GLUCOSE 101 (H) 09/21/2018   BUN 15 09/21/2018   CREATININE 0.96 09/21/2018   BILITOT 0.6 09/21/2018   ALKPHOS 66 09/21/2018   AST 22 09/21/2018   ALT 27 09/21/2018   PROT 5.3 (L) 09/21/2018   ALBUMIN 3.0 (L) 09/21/2018   CALCIUM 8.1 (L) 09/21/2018   GFRAA >60 09/21/2018    Speciality Comments: No specialty comments available.  Procedures:  Small Joint Inj: R thumb CMC on 11/01/2018 10:09 AM Indications: pain Details: 27 G needle,  ultrasound-guided radial approach Medications: 0.5 mL lidocaine 1 %; 10 mg triamcinolone acetonide 40 MG/ML Aspirate: 0 mL Outcome: tolerated well, no immediate complications Procedure, treatment alternatives, risks and benefits explained, specific risks discussed. Consent was given by the patient. Immediately prior to procedure a time out was called to verify the correct patient, procedure, equipment, support staff and site/side marked as required. Patient was prepped and draped in the usual sterile fashion.   Large Joint Inj: L glenohumeral on 11/01/2018 10:39 AM Indications: pain Details: 27 G 1.5 in needle, posterior approach  Arthrogram: No  Medications: 1 mL lidocaine 1 %; 40 mg triamcinolone acetonide 40 MG/ML Aspirate: 0 mL Outcome: tolerated well, no immediate complications Procedure, treatment alternatives, risks and benefits explained, specific risks discussed. Consent was given by the patient. Immediately prior to procedure a time out was called to verify the correct patient, procedure, equipment, support staff and site/side marked as required. Patient was prepped and draped in the usual sterile fashion.     Allergies: Lac bovis; Prednisone; Sulfa antibiotics; Tape; and Zithromax [azithromycin]   Assessment / Plan:     Visit Diagnoses: Primary osteoarthritis of both hands: She has PIP and DIP synovial thickening. She has no synovitis on exam.  She has bilateral CMC joint synovial thickening.  She has been having increased pain in the right [facility] joint and has been wearing a brace.  She requested a right CMC joint cortisone injection.  She tolerated the procedure well.  Procedure note completed above.  Aftercare discussed.  She was given a handout of hand exercises.  Joint protection and muscle strengthening were discussed.   Dupuytren's contracture of left hand - She was referred to hand rehab in the past.   Arthritis of carpometacarpal [facility]) joint of right thumb: She has right CMC  joint synovial thickening and tenderness.  She wears a CMC joint brace.  She requested a right CMC joint ultrasound guided cortisone injection.  She tolerated the procedure well.    Chronic left shoulder pain - She presents today with left shoulder joint pain for the past 6 weeks.  No injury.  She has painful and limited ROM of the left shoulder.  No warmth or effusion of the left shoulder joint.  X-rays of the left shoulder was obtained today that revealed a type 2 acromion.  No chondrocalcinosis noted. Verbal consent was obtained and a left shoulder cortisone injection was performed.  She was given  a handout of shoulder exercises to work on ROM and strengthening. Plan: XR Shoulder Left  Primary osteoarthritis of left shoulder: She has painful and limited range of motion of the left shoulder joint on exam.  She has been having progressively worsening pain for the past 6 weeks.  She denies any injuries.  An x-ray of the left shoulder was obtained today.  She requested a left shoulder cortisone injection.  She tolerated the procedure well.  The procedure note was completed above.  She was given a handout of shoulder exercises that she can perform.  DDD (degenerative disc disease), lumbar: She has no midline spinal tenderness.  She has good range of motion.  She has no discomfort at this time.  History of osteoporosis - She does not want to proceed with yearly Reclast infusions.  She was previously on Forteo.  She is followed by her endocrinologist Dr. Buddy Duty.   History of vertebral fracture: She has no midline spinal tenderness.  Vitamin D deficiency: She is taking a vitamin D supplement.  Other medical conditions are listed as follows:  Iron deficiency anemia due to chronic blood loss  Secondary hyperparathyroidism ([place])  History of gastric bypass - 2003.  B12 deficiency  History of asthma  Gastroesophageal reflux disease, esophagitis presence not specified   Orders: Orders Placed This  Encounter  Procedures  . Small Joint Inj  . Large Joint Inj  . XR Shoulder Left   No orders of the defined types were placed in this encounter.   Face-to-face time spent with patient was 30 minutes. Greater than 50% of time was spent in counseling and coordination of care.  Follow-Up Instructions: Return in about 6 months (around 05/02/2019) for Osteoarthritis, DDD, Osteoporosis.   Ofilia Neas, PA-C   I examined and evaluated the patient with Hazel Sams PA.  She was complaining of pain and discomfort in her left shoulder today.  The x-ray showed glenohumeral joint space narrowing.  Per her request left shoulder joint was injected with cortisone as described above.  She tolerated the procedure well.  She has been also having discomfort in her right CMC joint.  After informed consent was obtained right cortisone joint was injected with cortisone as described above.  Post injection precautions were discussed.  The plan of care was discussed as noted above.  Bo Merino, MD  Note - This record has been created using Editor, commissioning.  Chart creation errors have been sought, but may not always  have been located. Such creation errors do not reflect on  the standard of medical care.

## 2018-10-19 ENCOUNTER — Ambulatory Visit (HOSPITAL_COMMUNITY): Payer: Medicare Other | Admitting: Physical Therapy

## 2018-10-19 ENCOUNTER — Telehealth (HOSPITAL_COMMUNITY): Payer: Self-pay | Admitting: Physician Assistant

## 2018-10-19 NOTE — Telephone Encounter (Signed)
10/19/18  pt left a message that she got sick in the night and is running a fever

## 2018-10-20 ENCOUNTER — Telehealth (HOSPITAL_COMMUNITY): Payer: Self-pay | Admitting: Physical Therapy

## 2018-10-20 NOTE — Telephone Encounter (Signed)
Pt is sitll sick and not able to be here today.

## 2018-10-21 ENCOUNTER — Encounter (HOSPITAL_COMMUNITY): Payer: Medicare Other | Admitting: Physical Therapy

## 2018-10-24 ENCOUNTER — Ambulatory Visit (HOSPITAL_COMMUNITY): Payer: Medicare Other | Admitting: Physical Therapy

## 2018-10-24 DIAGNOSIS — I890 Lymphedema, not elsewhere classified: Secondary | ICD-10-CM | POA: Diagnosis not present

## 2018-10-24 NOTE — Therapy (Addendum)
[place], [place], [address] Phone: [phone]   Fax:  [phone]  Physical Therapy Treatment  Patient Details  Name: [name] MRN: [id number] Date of Birth: 1950/11/24 Referring Provider (PT): Derek Jack   Encounter Date: 10/24/2018  PT End of Session - 10/24/18 1514    Visit Number  7    Number of Visits  12    Date for PT Re-Evaluation  11/05/18    Authorization Type  [employer] medicare    Authorization - Visit Number  7    Authorization - Number of Visits  12    PT Start Time  1751    PT Stop Time  1430    PT Time Calculation (min)  45 min       Past Medical History:  Diagnosis Date  . Asthma   . GERD (gastroesophageal reflux disease)   . Internal hemorrhoids   . Iron deficiency anemia   . Osteoporosis   . Peptic ulcer   . Tubular adenoma of colon     Past Surgical History:  Procedure Laterality Date  . BACK SURGERY    . BREAST SURGERY    . CHOLECYSTECTOMY    . COLONOSCOPY WITH PROPOFOL N/A 06/02/2017   Procedure: COLONOSCOPY WITH PROPOFOL;  Surgeon: Jonathon Bellows, MD;  Location: [facility] ENDOSCOPY;  Service: Gastroenterology;  Laterality: N/A;  . ESOPHAGOGASTRODUODENOSCOPY (EGD) WITH PROPOFOL N/A 07/01/2017   Procedure: ESOPHAGOGASTRODUODENOSCOPY (EGD) WITH PROPOFOL;  Surgeon: Jonathon Bellows, MD;  Location: [facility] ENDOSCOPY;  Service: Gastroenterology;  Laterality: N/A;  . FLEXIBLE SIGMOIDOSCOPY N/A 07/01/2017   Procedure: FLEXIBLE SIGMOIDOSCOPY;  Surgeon: Jonathon Bellows, MD;  Location: [facility] ENDOSCOPY;  Service: Gastroenterology;  Laterality: N/A;  . KNEE SURGERY    . mini gastric bypass    . SHOULDER SURGERY    . TONSILLECTOMY      There were no vitals filed for this visit.  Subjective Assessment - 10/24/18 1512    Subjective  pt states she forgot her ankle bandages/foam.  STates she went by [facility] today and will be returning in the morning to get measured.  Pt states she received her  pump on saturday as well.      Currently in Pain?  No/denies            LYMPHEDEMA/ONCOLOGY QUESTIONNAIRE - 10/24/18 1516      Right Lower Extremity Lymphedema   30 cm Proximal to Suprapatella  60 cm    20 cm Proximal to Suprapatella  58.5 cm    10 cm Proximal to Suprapatella  50.5 cm    At Midpatella/Popliteal Crease  43 cm    30 cm Proximal to Floor at Lateral Plantar Foot  40 cm    20 cm Proximal to Floor at Lateral Plantar Foot  35.3 1    10  cm Proximal to Floor at Lateral Malleoli  28 cm    Circumference of ankle/heel  33 cm.    5 cm Proximal to 1st MTP Joint  23 cm    Across MTP Joint  22.4 cm    Around Proximal Great Toe  7.1 cm      Left Lower Extremity Lymphedema   30 cm Proximal to Suprapatella  60 cm    20 cm Proximal to Suprapatella  58 cm    10 cm Proximal to Suprapatella  51 cm    At Midpatella/Popliteal Crease  42 cm    30 cm Proximal to Floor at Lateral Plantar Foot  41 cm    20 cm Proximal to Floor at Lateral Plantar Foot  35.5 cm    10 cm Proximal to Floor at Lateral Malleoli  28.3 cm    Circumference of ankle/heel  34.2 cm.    5 cm Proximal to 1st MTP Joint  23.5 cm    Across MTP Joint  23 cm    Around Proximal Great Toe  7.1 cm                [facility] Adult PT Treatment/Exercise - 10/24/18 0001      Manual Therapy   Manual Therapy  Edema management    Manual therapy comments  completed seperate from all other aspects     Manual Lymphatic Drainage (MLD)  To include supraclavicular, deep and superfical abdominal, inguinal/axillary anastomosis and B LE completed both anterior and posterior.y     Compression Bandaging  pt donned her juxtalites following massage independently.                PT Short Term Goals - 10/07/18 0904      PT SHORT TERM GOAL #1   Title  PT volume to have decreased by 2cm to allow ease of donning shoes.     Time  2    Period  Weeks    Status  On-going      PT SHORT TERM GOAL #2   Title  PT to have acquired  a new juxtafit to have proper compression to decrease risk of flare up.     Time  2    Period  Weeks    Status  On-going      PT SHORT TERM GOAL #3   Title  PT to have acquired and be using compression pump on a daily basis.     Time  2    Period  Weeks    Status  On-going               Plan - 10/24/18 1520    Clinical Impression Statement  Pt returns today after being seen 1 week ago due to stomach virus.  Pt remeasured with noted slight increase in volume in distal LE's (Lt >Rt).  No significant change in upper thighs.  Pt forgot bandages and foam this session so only completed manual this session.  Pt to get measured at [facility] in the morning for new juxtas.      Rehab Potential  Good    PT Frequency  3x / week    PT Duration  3 weeks    PT Treatment/Interventions  ADLs/Self Care Home Management;Compression bandaging;Manual lymph drainage    PT Next Visit Plan  Continue with manual lymph drainage to B LE with focus on ankle area until new garments received.   Measure weekly (monday or Wednesday)..    PT Home Exercise Plan  ankle pump, marching, LAQ, hip ab/adduction, hip IR/ER       Patient will benefit from skilled therapeutic intervention in order to improve the following deficits and impairments:  Increased edema, Decreased skin integrity  Visit Diagnosis: Lymphedema, not elsewhere classified     Problem List Patient Active Problem List   Diagnosis Date Noted  . Left-sided weakness   . Stroke-like symptoms 02/24/2018  . HTN (hypertension) 02/24/2018  . Primary osteoarthritis of both hands 11/03/2017  . Dupuytren's contracture of left hand 11/03/2017  . DDD (degenerative disc disease), lumbar 11/03/2017  . History of gastric bypass 11/03/2017  . Lymphedema 09/17/2017  .  History of vertebral fracture 09/17/2017  . History of osteoporosis 09/17/2017  . Iron deficiency anemia due to chronic blood loss 06/28/2017  . Lower GI bleed 05/31/2017  . Rectal mass  05/31/2017  . Asthma 05/31/2017  . GERD (gastroesophageal reflux disease) 05/31/2017  . Closed fracture of upper end of humerus 09/23/2016  . Tendinitis 09/23/2016  . Patulous eustachian tube of right ear 11/07/2015  . 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 Vitamin D deficiency 08/20/2014  . Secondary hyperparathyroidism ([place]) 08/20/2014   Teena Irani, PTA/CLT [place], PT CLT [phone] 10/24/2018, 3:25 PM  [facility] [address] [place], [place], [address] Phone: [phone]   Fax:  [phone]  Name: Sedonia Ryals Burns MRN: [id number] Date of Birth: 09/22/1950

## 2018-10-24 NOTE — Addendum Note (Signed)
Addended by: Leeroy Cha on: 10/24/2018 04:52 PM   Modules accepted: Orders

## 2018-10-25 ENCOUNTER — Telehealth (HOSPITAL_COMMUNITY): Payer: Self-pay | Admitting: Physical Therapy

## 2018-10-25 NOTE — Telephone Encounter (Signed)
Please calle [facility] about Compress order @ [phone]  Ext [phone] ask for [employer]

## 2018-10-26 ENCOUNTER — Encounter (HOSPITAL_COMMUNITY): Payer: Self-pay | Admitting: Physical Therapy

## 2018-10-26 ENCOUNTER — Ambulatory Visit (HOSPITAL_COMMUNITY): Payer: Medicare Other | Admitting: Physical Therapy

## 2018-10-26 DIAGNOSIS — I890 Lymphedema, not elsewhere classified: Secondary | ICD-10-CM | POA: Diagnosis not present

## 2018-10-26 NOTE — Therapy (Addendum)
[place], [place], [address] Phone: [phone]   Fax:  [phone]  Physical Therapy Treatment  Patient Details  Name: [name] MRN: [id number] Date of Birth: [birth year] Referring Provider (PT): Derek Jack   Encounter Date: 10/26/2018  PT End of Session - 10/26/18 1201    Visit Number  8    Number of Visits  12    Date for PT Re-Evaluation  11/05/18    Authorization Type  [place] - Visit Number  8    Authorization - Number of Visits  12    PT Start Time  9233    PT Stop Time  1138    PT Time Calculation (min)  62 min       Past Medical History:  Diagnosis Date  . Asthma   . GERD (gastroesophageal reflux disease)   . Internal hemorrhoids   . Iron deficiency anemia   . Osteoporosis   . Peptic ulcer   . Tubular adenoma of colon     Past Surgical History:  Procedure Laterality Date  . BACK SURGERY    . BREAST SURGERY    . CHOLECYSTECTOMY    . COLONOSCOPY WITH PROPOFOL N/A 06/02/2017   Procedure: COLONOSCOPY WITH PROPOFOL;  Surgeon: Jonathon Bellows, MD;  Location: [facility] ENDOSCOPY;  Service: Gastroenterology;  Laterality: N/A;  . ESOPHAGOGASTRODUODENOSCOPY (EGD) WITH PROPOFOL N/A 07/01/2017   Procedure: ESOPHAGOGASTRODUODENOSCOPY (EGD) WITH PROPOFOL;  Surgeon: Jonathon Bellows, MD;  Location: [facility] ENDOSCOPY;  Service: Gastroenterology;  Laterality: N/A;  . FLEXIBLE SIGMOIDOSCOPY N/A 07/01/2017   Procedure: FLEXIBLE SIGMOIDOSCOPY;  Surgeon: Jonathon Bellows, MD;  Location: [facility] ENDOSCOPY;  Service: Gastroenterology;  Laterality: N/A;  . KNEE SURGERY    . mini gastric bypass    . SHOULDER SURGERY    . TONSILLECTOMY      There were no vitals filed for this visit.  Subjective Assessment - 10/26/18 1158    Subjective  pt reports she went yesterday to [facility] and was measured for her juxtas.  Reports no issues today.    Currently in Pain?  No/denies                        [facility] Adult PT Treatment/Exercise - 10/26/18 0001      Manual Therapy   Manual Therapy  Edema management    Manual therapy comments  completed seperate from all other aspects     Manual Lymphatic Drainage (MLD)  To include supraclavicular, deep and superfical abdominal, inguinal/axillary anastomosis and B LE completed both anterior and posterior    Compression Bandaging  1 10cm short stretch bandage used as well as 1/2" foam wheels on lateral and medial malleoli bilaterally in addition to juxtalite.  pt donned her juxtalites following massage independently.                PT Short Term Goals - 10/07/18 0904      PT SHORT TERM GOAL #1   Title  PT volume to have decreased by 2cm to allow ease of donning shoes.     Time  2    Period  Weeks    Status  On-going      PT SHORT TERM GOAL #2   Title  PT to have acquired a new juxtafit to have proper compression to decrease risk of flare up.     Time  2    Period  Weeks  Status  On-going      PT SHORT TERM GOAL #3   Title  PT to have acquired and be using compression pump on a daily basis.     Time  2    Period  Weeks    Status  On-going               Plan - 10/26/18 1203    Clinical Impression Statement  spoke with Nira Conn at [facility] yesterday regarding juxtafit garments and they have been ordered for patient.  Extra time spent around ankles this session with manual.  completed additional bandaging to ankles prior to patient donning juxtas this session.  Pt reported overall comfort.      Rehab Potential  Good    PT Frequency  3x / week    PT Duration  3 weeks    PT Treatment/Interventions  ADLs/Self Care Home Management;Compression bandaging;Manual lymph drainage    PT Next Visit Plan  Continue with manual lymph drainage to B LE with focus on ankle area.   Measure weekly.    PT Home Exercise Plan  ankle pump, marching, LAQ, hip ab/adduction, hip IR/ER       Patient will benefit  from skilled therapeutic intervention in order to improve the following deficits and impairments:  Increased edema, Decreased skin integrity  Visit Diagnosis: Lymphedema, not elsewhere classified     Problem List Patient Active Problem List   Diagnosis Date Noted  . Left-sided weakness   . Stroke-like symptoms 02/24/2018  . HTN (hypertension) 02/24/2018  . Primary osteoarthritis of both hands 11/03/2017  . Dupuytren's contracture of left hand 11/03/2017  . DDD (degenerative disc disease), lumbar 11/03/2017  . History of gastric bypass 11/03/2017  . Lymphedema 09/17/2017  . History of vertebral fracture 09/17/2017  . History of osteoporosis 09/17/2017  . Iron deficiency anemia due to chronic blood loss 06/28/2017  . Lower GI bleed 05/31/2017  . Rectal mass 05/31/2017  . Asthma 05/31/2017  . GERD (gastroesophageal reflux disease) 05/31/2017  . Closed fracture of upper end of humerus 09/23/2016  . Tendinitis 09/23/2016  . Patulous eustachian tube of right ear 11/07/2015  . 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 Vitamin D deficiency 08/20/2014  . Secondary hyperparathyroidism ([place]) 08/20/2014   Teena Irani, PTA/CLT [phone]  Teena Irani 10/26/2018, 12:05 PM  [facility] [address] [place], [place], [address] Phone: [phone]   Fax:  [phone]  Name: [name] MRN: [id number] Date of Birth: 04-22-1951

## 2018-10-28 ENCOUNTER — Ambulatory Visit (HOSPITAL_COMMUNITY): Payer: Medicare Other | Admitting: Physical Therapy

## 2018-10-28 DIAGNOSIS — I890 Lymphedema, not elsewhere classified: Secondary | ICD-10-CM | POA: Diagnosis not present

## 2018-10-28 NOTE — Therapy (Addendum)
[place], [place], [address] Phone: [phone]   Fax:  [phone]  Physical Therapy Treatment  Patient Details  Name: [name] MRN: [id number] Date of Birth: June 19, 1951 Referring Provider (PT): Derek Jack   Encounter Date: 10/28/2018  PT End of Session - 10/28/18 1147    Visit Number  9   Number of Visits  12    Date for PT Re-Evaluation  11/05/18    Authorization Type  [employer] medicare    Authorization - Visit Number  9   Authorization - Number of Visits  12    PT Start Time  0820    PT Stop Time  0908    PT Time Calculation (min)  48 min       Past Medical History:  Diagnosis Date  . Asthma   . GERD (gastroesophageal reflux disease)   . Internal hemorrhoids   . Iron deficiency anemia   . Osteoporosis   . Peptic ulcer   . Tubular adenoma of colon     Past Surgical History:  Procedure Laterality Date  . BACK SURGERY    . BREAST SURGERY    . CHOLECYSTECTOMY    . COLONOSCOPY WITH PROPOFOL N/A 06/02/2017   Procedure: COLONOSCOPY WITH PROPOFOL;  Surgeon: Jonathon Bellows, MD;  Location: [facility] ENDOSCOPY;  Service: Gastroenterology;  Laterality: N/A;  . ESOPHAGOGASTRODUODENOSCOPY (EGD) WITH PROPOFOL N/A 07/01/2017   Procedure: ESOPHAGOGASTRODUODENOSCOPY (EGD) WITH PROPOFOL;  Surgeon: Jonathon Bellows, MD;  Location: [facility] ENDOSCOPY;  Service: Gastroenterology;  Laterality: N/A;  . FLEXIBLE SIGMOIDOSCOPY N/A 07/01/2017   Procedure: FLEXIBLE SIGMOIDOSCOPY;  Surgeon: Jonathon Bellows, MD;  Location: [facility] ENDOSCOPY;  Service: Gastroenterology;  Laterality: N/A;  . KNEE SURGERY    . mini gastric bypass    . SHOULDER SURGERY    . TONSILLECTOMY      There were no vitals filed for this visit.  Subjective Assessment - 10/28/18 1143    Subjective  pt without any issues this session.  STates her ankles look much better after wearing the additional short stretch.     Currently in Pain?  No/denies                        [facility] Adult PT Treatment/Exercise - 10/28/18 0001      Manual Therapy   Manual Therapy  Edema management    Manual therapy comments  completed seperate from all other aspects     Manual Lymphatic Drainage (MLD)  To include supraclavicular, deep and superfical abdominal, inguinal/axillary anastomosis and B LE completed both anterior and posterior    Compression Bandaging  1 10cm short stretch bandage used as well as 1/2" foam wheels on lateral and medial malleoli bilaterally in addition to juxtalite.  pt donned her juxtalites following massage independently.                PT Short Term Goals - 10/07/18 0904      PT SHORT TERM GOAL #1   Title  PT volume to have decreased by 2cm to allow ease of donning shoes.     Time  2    Period  Weeks    Status  On-going      PT SHORT TERM GOAL #2   Title  PT to have acquired a new juxtafit to have proper compression to decrease risk of flare up.     Time  2    Period  Weeks  Status  On-going      PT SHORT TERM GOAL #3   Title  PT to have acquired and be using compression pump on a daily basis.     Time  2    Period  Weeks    Status  On-going               Plan - 10/28/18 1148    Clinical Impression Statement  contiued with manual to bilateral LE's, anteriorly and posteriorly.  No induration present this session.  bandaged and used juxtalites following.     Rehab Potential  Good    PT Frequency  3x / week    PT Duration  3 weeks    PT Treatment/Interventions  ADLs/Self Care Home Management;Compression bandaging;Manual lymph drainage    PT Next Visit Plan  Continue with manual lymph drainage to B LE with focus on ankle area.   Measure weekly.    PT Home Exercise Plan  ankle pump, marching, LAQ, hip ab/adduction, hip IR/ER       Patient will benefit from skilled therapeutic intervention in order to improve the following deficits and impairments:  Increased edema, Decreased skin  integrity  Visit Diagnosis: Lymphedema, not elsewhere classified     Problem List Patient Active Problem List   Diagnosis Date Noted  . Left-sided weakness   . Stroke-like symptoms 02/24/2018  . HTN (hypertension) 02/24/2018  . Primary osteoarthritis of both hands 11/03/2017  . Dupuytren's contracture of left hand 11/03/2017  . DDD (degenerative disc disease), lumbar 11/03/2017  . History of gastric bypass 11/03/2017  . Lymphedema 09/17/2017  . History of vertebral fracture 09/17/2017  . History of osteoporosis 09/17/2017  . Iron deficiency anemia due to chronic blood loss 06/28/2017  . Lower GI bleed 05/31/2017  . Rectal mass 05/31/2017  . Asthma 05/31/2017  . GERD (gastroesophageal reflux disease) 05/31/2017  . Closed fracture of upper end of humerus 09/23/2016  . Tendinitis 09/23/2016  . Patulous eustachian tube of right ear 11/07/2015  . 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 Vitamin D deficiency 08/20/2014  . Secondary hyperparathyroidism ([place]) 08/20/2014   Teena Irani, PTA/CLT [phone]  Teena Irani 10/28/2018, 11:49 AM  [facility] [address] [place], [place], [address] Phone: [phone]   Fax:  [phone]  Name: [name] MRN: [id number] Date of Birth: 1950-10-11

## 2018-10-31 ENCOUNTER — Ambulatory Visit (HOSPITAL_COMMUNITY): Payer: Medicare Other | Admitting: Physical Therapy

## 2018-10-31 DIAGNOSIS — I890 Lymphedema, not elsewhere classified: Secondary | ICD-10-CM

## 2018-10-31 NOTE — Therapy (Signed)
[place], [place], [address] Phone: [phone]   Fax:  [phone]  Physical Therapy Treatment Progress Note  Reporting Period 10/06/18  to 10/31/18  See note below for Objective Data and Assessment of Progress/Goals.      Patient Details  Name: [name] MRN: [id number] Date of Birth: Sep 04, 1951 Referring Provider (PT): Derek Jack   Encounter Date: 10/31/2018  PT End of Session - 10/31/18 1218    Visit Number  10    Number of Visits  12    Date for PT Re-Evaluation  11/05/18    Authorization Type  [employer] medicare cert 8/91-6/94, Progress note completed visit #10    Authorization - Visit Number  10    Authorization - Number of Visits  12    PT Start Time  1120    PT Stop Time  1205    PT Time Calculation (min)  45 min       Past Medical History:  Diagnosis Date  . Asthma   . GERD (gastroesophageal reflux disease)   . Internal hemorrhoids   . Iron deficiency anemia   . Osteoporosis   . Peptic ulcer   . Tubular adenoma of colon     Past Surgical History:  Procedure Laterality Date  . BACK SURGERY    . BREAST SURGERY    . CHOLECYSTECTOMY    . COLONOSCOPY WITH PROPOFOL N/A 06/02/2017   Procedure: COLONOSCOPY WITH PROPOFOL;  Surgeon: Jonathon Bellows, MD;  Location: [facility] ENDOSCOPY;  Service: Gastroenterology;  Laterality: N/A;  . ESOPHAGOGASTRODUODENOSCOPY (EGD) WITH PROPOFOL N/A 07/01/2017   Procedure: ESOPHAGOGASTRODUODENOSCOPY (EGD) WITH PROPOFOL;  Surgeon: Jonathon Bellows, MD;  Location: [facility] ENDOSCOPY;  Service: Gastroenterology;  Laterality: N/A;  . FLEXIBLE SIGMOIDOSCOPY N/A 07/01/2017   Procedure: FLEXIBLE SIGMOIDOSCOPY;  Surgeon: Jonathon Bellows, MD;  Location: [facility] ENDOSCOPY;  Service: Gastroenterology;  Laterality: N/A;  . KNEE SURGERY    . mini gastric bypass    . SHOULDER SURGERY    . TONSILLECTOMY      There were no vitals filed for this visit.  Subjective Assessment - 10/31/18  1217    Subjective  pt states she can tell her Legs are down alot today.  States she is going to run by [employer] today and check on her juxtas.     Currently in Pain?  No/denies            LYMPHEDEMA/ONCOLOGY QUESTIONNAIRE - 10/31/18 1217      Right Lower Extremity Lymphedema   30 cm Proximal to Suprapatella  60 cm    20 cm Proximal to Suprapatella  57 cm    10 cm Proximal to Suprapatella  50 cm    At Midpatella/Popliteal Crease  42 cm    30 cm Proximal to Floor at Lateral Plantar Foot  39.5 cm    20 cm Proximal to Floor at Lateral Plantar Foot  34.6 1    10  cm Proximal to Floor at Lateral Malleoli  27 cm    Circumference of ankle/heel  32.5 cm.    5 cm Proximal to 1st MTP Joint  23 cm    Across MTP Joint  22.2 cm    Around Proximal Great Toe  7.1 cm      Left Lower Extremity Lymphedema   30 cm Proximal to Suprapatella  60 cm    20 cm Proximal to Suprapatella  57 cm    10 cm Proximal to Suprapatella  50 cm  At Midpatella/Popliteal Crease  42 cm    30 cm Proximal to Floor at Lateral Plantar Foot  40.5 cm    20 cm Proximal to Floor at Lateral Plantar Foot  36 cm    10 cm Proximal to Floor at Lateral Malleoli  27.8 cm    Circumference of ankle/heel  33 cm.    5 cm Proximal to 1st MTP Joint  23.5 cm    Across MTP Joint  23 cm    Around Proximal Great Toe  7.1 cm                [facility] Adult PT Treatment/Exercise - 10/31/18 0001      Manual Therapy   Manual Therapy  Edema management;Other (comment)    Manual therapy comments  completed seperate from all other aspects     Manual Lymphatic Drainage (MLD)  To include supraclavicular, deep and superfical abdominal, inguinal/axillary anastomosis and B LE completed both anterior and posterior    Compression Bandaging  1 10cm short stretch bandage used as well as 1/2" foam wheels on lateral and medial malleoli bilaterally in addition to juxtalite.  pt donned her juxtalites following massage independently.     Other Manual  Therapy  measurement               PT Short Term Goals - 10/31/18 1223      PT SHORT TERM GOAL #1   Title  PT volume to have decreased by 2cm to allow ease of donning shoes.     Time  2    Period  Weeks    Status  Achieved      PT SHORT TERM GOAL #2   Title  PT to have acquired a new juxtafit to have proper compression to decrease risk of flare up.     Time  2    Period  Weeks    Status  On-going      PT SHORT TERM GOAL #3   Title  PT to have acquired and be using compression pump on a daily basis.     Time  2    Period  Weeks    Status  Achieved               Plan - 10/31/18 1220    Clinical Impression Statement  10th visit progress note completed.  Pt remeasured today with additional reduction in volume in bilateral LE's, even up into thighs.  No induration present.  Pt will benefit from continued massage and wrapping until receives her new juxta garments from [facility].     Rehab Potential  Good    PT Frequency  3x / week    PT Duration  3 weeks    PT Treatment/Interventions  ADLs/Self Care Home Management;Compression bandaging;Manual lymph drainage    PT Next Visit Plan  Continue with manual lymph drainage to B LE with focus on ankle area.   Measure weekly.    PT Home Exercise Plan  ankle pump, marching, LAQ, hip ab/adduction, hip IR/ER       Patient will benefit from skilled therapeutic intervention in order to improve the following deficits and impairments:  Increased edema, Decreased skin integrity  Visit Diagnosis: Lymphedema, not elsewhere classified     Problem List Patient Active Problem List   Diagnosis Date Noted  . Left-sided weakness   . Stroke-like symptoms 02/24/2018  . HTN (hypertension) 02/24/2018  . Primary osteoarthritis of both hands 11/03/2017  . Dupuytren's contracture of  left hand 11/03/2017  . DDD (degenerative disc disease), lumbar 11/03/2017  . History of gastric bypass 11/03/2017  . Lymphedema 09/17/2017  . History of  vertebral fracture 09/17/2017  . History of osteoporosis 09/17/2017  . Iron deficiency anemia due to chronic blood loss 06/28/2017  . Lower GI bleed 05/31/2017  . Rectal mass 05/31/2017  . Asthma 05/31/2017  . GERD (gastroesophageal reflux disease) 05/31/2017  . Closed fracture of upper end of humerus 09/23/2016  . Tendinitis 09/23/2016  . Patulous eustachian tube of right ear 11/07/2015  . 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 Vitamin D deficiency 08/20/2014  . Secondary hyperparathyroidism ([place]) 08/20/2014   Teena Irani, PTA/CLT [phone]  Teena Irani 10/31/2018, 12:24 PM  [facility] [address] [place], [place], [address] Phone: [phone]   Fax:  [phone]  Name: Reha Ryals Burns MRN: [id number] Date of Birth: 1951/04/15

## 2018-11-01 ENCOUNTER — Ambulatory Visit (INDEPENDENT_AMBULATORY_CARE_PROVIDER_SITE_OTHER): Payer: Self-pay

## 2018-11-01 ENCOUNTER — Encounter: Payer: Self-pay | Admitting: Rheumatology

## 2018-11-01 ENCOUNTER — Ambulatory Visit (INDEPENDENT_AMBULATORY_CARE_PROVIDER_SITE_OTHER): Payer: Medicare Other | Admitting: Rheumatology

## 2018-11-01 VITALS — BP 127/79 | HR 66 | Resp 13 | Ht 63.0 in | Wt 164.4 lb

## 2018-11-01 DIAGNOSIS — D500 Iron deficiency anemia secondary to blood loss (chronic): Secondary | ICD-10-CM

## 2018-11-01 DIAGNOSIS — K219 Gastro-esophageal reflux disease without esophagitis: Secondary | ICD-10-CM

## 2018-11-01 DIAGNOSIS — M1811 Unilateral primary osteoarthritis of first carpometacarpal joint, right hand: Secondary | ICD-10-CM

## 2018-11-01 DIAGNOSIS — M720 Palmar fascial fibromatosis [Dupuytren]: Secondary | ICD-10-CM | POA: Diagnosis not present

## 2018-11-01 DIAGNOSIS — Z9884 Bariatric surgery status: Secondary | ICD-10-CM

## 2018-11-01 DIAGNOSIS — M25512 Pain in left shoulder: Secondary | ICD-10-CM

## 2018-11-01 DIAGNOSIS — E559 Vitamin D deficiency, unspecified: Secondary | ICD-10-CM

## 2018-11-01 DIAGNOSIS — E538 Deficiency of other specified B group vitamins: Secondary | ICD-10-CM

## 2018-11-01 DIAGNOSIS — G8929 Other chronic pain: Secondary | ICD-10-CM | POA: Diagnosis not present

## 2018-11-01 DIAGNOSIS — M19041 Primary osteoarthritis, right hand: Secondary | ICD-10-CM

## 2018-11-01 DIAGNOSIS — Z8781 Personal history of (healed) traumatic fracture: Secondary | ICD-10-CM

## 2018-11-01 DIAGNOSIS — M5136 Other intervertebral disc degeneration, lumbar region: Secondary | ICD-10-CM

## 2018-11-01 DIAGNOSIS — Z8709 Personal history of other diseases of the respiratory system: Secondary | ICD-10-CM

## 2018-11-01 DIAGNOSIS — M19012 Primary osteoarthritis, left shoulder: Secondary | ICD-10-CM

## 2018-11-01 DIAGNOSIS — N2581 Secondary hyperparathyroidism of renal origin: Secondary | ICD-10-CM

## 2018-11-01 DIAGNOSIS — Z8739 Personal history of other diseases of the musculoskeletal system and connective tissue: Secondary | ICD-10-CM

## 2018-11-01 DIAGNOSIS — M19042 Primary osteoarthritis, left hand: Secondary | ICD-10-CM

## 2018-11-01 MED ORDER — TRIAMCINOLONE ACETONIDE 40 MG/ML IJ SUSP
40.0000 mg | INTRAMUSCULAR | Status: AC | PRN
  Administered 2018-11-01: 40 mg via INTRA_ARTICULAR

## 2018-11-01 MED ORDER — TRIAMCINOLONE ACETONIDE 40 MG/ML IJ SUSP
10.0000 mg | INTRAMUSCULAR | Status: AC | PRN
  Administered 2018-11-01: 10 mg via INTRA_ARTICULAR

## 2018-11-01 MED ORDER — LIDOCAINE HCL 1 % IJ SOLN
1.0000 mL | INTRAMUSCULAR | Status: AC | PRN
  Administered 2018-11-01: 1 mL

## 2018-11-01 MED ORDER — LIDOCAINE HCL 1 % IJ SOLN
0.5000 mL | INTRAMUSCULAR | Status: AC | PRN
  Administered 2018-11-01: .5 mL

## 2018-11-01 NOTE — Patient Instructions (Signed)

## 2018-11-02 ENCOUNTER — Ambulatory Visit (HOSPITAL_COMMUNITY): Payer: Medicare Other | Admitting: Physical Therapy

## 2018-11-02 DIAGNOSIS — I890 Lymphedema, not elsewhere classified: Secondary | ICD-10-CM

## 2018-11-02 NOTE — Therapy (Addendum)
[place], [place], [address] Phone: [phone]   Fax:  [phone]  Physical Therapy Treatment  Patient Details  Name: [name] MRN: [id number] Date of Birth: 02-Sep-1951 Referring Provider (PT): [place] THERAPY DISCHARGE SUMMARY  Visits from Start of Care: 11  Current functional level related to goals / functional outcomes: Pt now has a compression pump and has juxtafit vs. juxtalites which  Will control the edma much better.    Remaining deficits: Some noted chronic edema    Education / Equipment: The importance of using her pump as well as using her compression garment on a daily basisi  Plan: Patient agrees to discharge.  Patient goals were not met. Patient is being discharged due to meeting the stated rehab goals.  ?????      Encounter Date: 11/02/2018  PT End of Session - 11/02/18 1716    Visit Number  11    Number of Visits  11   Date for PT Re-Evaluation  11/05/18    Authorization Type  [employer] medicare cert 6/72-8/97, Progress note completed visit #10    Authorization - Visit Number  11    Authorization - Number of Visits  121   PT Start Time  9150    PT Stop Time  1200    PT Time Calculation (min)  38 min       Past Medical History:  Diagnosis Date  . Asthma   . GERD (gastroesophageal reflux disease)   . Internal hemorrhoids   . Iron deficiency anemia   . Osteoporosis   . Peptic ulcer   . Tubular adenoma of colon     Past Surgical History:  Procedure Laterality Date  . BACK SURGERY    . BREAST SURGERY    . CHOLECYSTECTOMY    . COLONOSCOPY WITH PROPOFOL N/A 06/02/2017   Procedure: COLONOSCOPY WITH PROPOFOL;  Surgeon: Jonathon Bellows, MD;  Location: [facility] ENDOSCOPY;  Service: Gastroenterology;  Laterality: N/A;  . ESOPHAGOGASTRODUODENOSCOPY (EGD) WITH PROPOFOL N/A 07/01/2017   Procedure: ESOPHAGOGASTRODUODENOSCOPY (EGD) WITH PROPOFOL;  Surgeon: Jonathon Bellows,  MD;  Location: [facility] ENDOSCOPY;  Service: Gastroenterology;  Laterality: N/A;  . FLEXIBLE SIGMOIDOSCOPY N/A 07/01/2017   Procedure: FLEXIBLE SIGMOIDOSCOPY;  Surgeon: Jonathon Bellows, MD;  Location: [facility] ENDOSCOPY;  Service: Gastroenterology;  Laterality: N/A;  . KNEE SURGERY    . mini gastric bypass    . SHOULDER SURGERY    . TONSILLECTOMY      There were no vitals filed for this visit.  Subjective Assessment - 11/02/18 1715    Subjective  pt states she has an appoitment at 1 today to get her juxtafit garment.     Currently in Pain?  No/denies            [facility] Adult PT Treatment/Exercise - 11/02/18 0001      Manual Therapy   Manual Therapy  Other (comment);Edema management    Manual therapy comments  completed seperate from all other aspects     Manual Lymphatic Drainage (MLD)  To include supraclavicular, deep and superfical abdominal, inguinal/axillary anastomosis and B LE completed both anterior and posterior               PT Short Term Goals - 10/31/18 1223      PT SHORT TERM GOAL #1   Title  PT volume to have decreased by 2cm to allow ease of donning shoes.     Time  2  Period  Weeks    Status  Achieved      PT SHORT TERM GOAL #2   Title  PT to have acquired a new juxtafit to have proper compression to decrease risk of flare up.     Time  2    Period  Weeks    Status  On-going will be completed today      PT SHORT TERM GOAL #3   Title  PT to have acquired and be using compression pump on a daily basis.     Time  2    Period  Weeks    Status  Achieved               Plan - 11/02/18 1716    Clinical Impression Statement  manual lymph drainge completed for bilaterla LE's, however did not place additional short stretch on this session as she is going to get her new garments after leaving clinic.  Pt is to call if all is well with her new garemnts and feel she no longer needs skilled services.  Otherwise, will continue until she receives proper fitting  garments.      PT Next Visit Plan  Await from communicaiton from pateint regarding continued care vs discharge.        Patient will benefit from skilled therapeutic intervention in order to improve the following deficits and impairments:     Visit Diagnosis: Lymphedema, not elsewhere classified     Problem List Patient Active Problem List   Diagnosis Date Noted  . Left-sided weakness   . Stroke-like symptoms 02/24/2018  . HTN (hypertension) 02/24/2018  . Primary osteoarthritis of both hands 11/03/2017  . Dupuytren's contracture of left hand 11/03/2017  . DDD (degenerative disc disease), lumbar 11/03/2017  . History of gastric bypass 11/03/2017  . Lymphedema 09/17/2017  . History of vertebral fracture 09/17/2017  . History of osteoporosis 09/17/2017  . Iron deficiency anemia due to chronic blood loss 06/28/2017  . Lower GI bleed 05/31/2017  . Rectal mass 05/31/2017  . Asthma 05/31/2017  . GERD (gastroesophageal reflux disease) 05/31/2017  . Closed fracture of upper end of humerus 09/23/2016  . Tendinitis 09/23/2016  . Patulous eustachian tube of right ear 11/07/2015  . 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 Vitamin D deficiency 08/20/2014  . Secondary hyperparathyroidism ([place]) 08/20/2014   Teena Irani, PTA/CLT [place], PT CLT [phone] 11/02/2018, 5:19 PM  [place] [address] [place], [place], [address] Phone: [phone]   Fax:  [phone]  Name: [name] MRN: [id number] Date of Birth: 1951/01/05

## 2018-11-03 ENCOUNTER — Telehealth (HOSPITAL_COMMUNITY): Payer: Self-pay | Admitting: Physician Assistant

## 2018-11-03 NOTE — Telephone Encounter (Signed)
11/03/18  pt left a message to cx the Fri and Mon appt - no reason was given

## 2018-11-04 ENCOUNTER — Ambulatory Visit (HOSPITAL_COMMUNITY): Payer: Medicare Other | Admitting: Physical Therapy

## 2018-11-07 ENCOUNTER — Ambulatory Visit (HOSPITAL_COMMUNITY): Payer: Medicare Other | Admitting: Physical Therapy

## 2018-11-09 ENCOUNTER — Ambulatory Visit (HOSPITAL_COMMUNITY): Payer: Medicare Other | Admitting: Physical Therapy

## 2018-11-11 ENCOUNTER — Ambulatory Visit (HOSPITAL_COMMUNITY): Payer: Medicare Other | Admitting: Physical Therapy

## 2018-11-14 ENCOUNTER — Encounter (HOSPITAL_COMMUNITY): Payer: Medicare Other | Admitting: Physical Therapy

## 2018-11-16 ENCOUNTER — Encounter (HOSPITAL_COMMUNITY): Payer: Medicare Other | Admitting: Physical Therapy

## 2018-11-18 ENCOUNTER — Encounter (HOSPITAL_COMMUNITY): Payer: Medicare Other | Admitting: Physical Therapy

## 2018-11-21 ENCOUNTER — Encounter (HOSPITAL_COMMUNITY): Payer: Medicare Other | Admitting: Physical Therapy

## 2018-11-23 ENCOUNTER — Encounter (HOSPITAL_COMMUNITY): Payer: Medicare Other | Admitting: Physical Therapy

## 2018-11-25 ENCOUNTER — Encounter (HOSPITAL_COMMUNITY): Payer: Medicare Other | Admitting: Physical Therapy

## 2018-11-28 ENCOUNTER — Encounter (HOSPITAL_COMMUNITY): Payer: Medicare Other | Admitting: Physical Therapy

## 2018-11-30 ENCOUNTER — Encounter (HOSPITAL_COMMUNITY): Payer: Medicare Other | Admitting: Physical Therapy

## 2018-12-02 ENCOUNTER — Encounter (HOSPITAL_COMMUNITY): Payer: Medicare Other | Admitting: Physical Therapy

## 2018-12-05 ENCOUNTER — Encounter (HOSPITAL_COMMUNITY): Payer: Medicare Other | Admitting: Physical Therapy

## 2018-12-07 ENCOUNTER — Encounter (HOSPITAL_COMMUNITY): Payer: Medicare Other | Admitting: Physical Therapy

## 2018-12-09 ENCOUNTER — Encounter (HOSPITAL_COMMUNITY): Payer: Medicare Other | Admitting: Physical Therapy

## 2018-12-12 ENCOUNTER — Encounter (HOSPITAL_COMMUNITY): Payer: Medicare Other | Admitting: Physical Therapy

## 2018-12-14 ENCOUNTER — Encounter (HOSPITAL_COMMUNITY): Payer: Medicare Other | Admitting: Physical Therapy

## 2018-12-16 ENCOUNTER — Encounter (HOSPITAL_COMMUNITY): Payer: Medicare Other | Admitting: Physical Therapy

## 2018-12-21 ENCOUNTER — Encounter (HOSPITAL_COMMUNITY): Payer: 59

## 2019-01-19 ENCOUNTER — Other Ambulatory Visit (HOSPITAL_COMMUNITY): Payer: Medicare Other

## 2019-01-26 ENCOUNTER — Ambulatory Visit (HOSPITAL_COMMUNITY): Payer: Medicare Other | Admitting: Hematology

## 2019-02-21 ENCOUNTER — Other Ambulatory Visit (HOSPITAL_COMMUNITY): Payer: Medicare Other

## 2019-02-28 ENCOUNTER — Ambulatory Visit (HOSPITAL_COMMUNITY): Payer: Medicare Other | Admitting: Hematology

## 2019-03-31 LAB — HM MAMMOGRAPHY

## 2019-04-05 ENCOUNTER — Encounter: Payer: Self-pay | Admitting: Physician Assistant

## 2019-04-18 NOTE — Progress Notes (Signed)
Office Visit Note  Patient: [name]             Date of Birth: 11-12-1950           MRN: [id number]             PCP: Mar Daring, PA-C Referring: Florian Buff* Visit Date: 05/02/2019 Occupation: @GUAROCC @  Subjective:  Right CMC joint pain   History of Present Illness: [name] is a 68 y.o. female with history of osteoarthritis. She is having increased pain in the right CMC joint.  She had a cortisone injection on 11/01/18, which provided significant relief.  She has difficulty cutting and chopping food while cooking. She has chronic left shoulder joint pain.  She had a left shoulder cortisone injection on 11/01/18.  She has good ROM of the left shoulder at this time.  She has chronic pain in both knee joints.  She denies any joint swelling. She has chronic lower back pain and has had surgery in the past.   Activities of Daily Living:  Patient reports morning stiffness for 30  minutes.   Patient Reports nocturnal pain.  Difficulty dressing/grooming: Denies Difficulty climbing stairs: Reports Difficulty getting out of chair: Reports Difficulty using hands for taps, buttons, cutlery, and/or writing: Reports  Review of Systems  Constitutional: Positive for fatigue.  HENT: Negative for mouth sores, mouth dryness and nose dryness.   Eyes: Negative for pain, visual disturbance and dryness.  Respiratory: Negative for cough, hemoptysis, shortness of breath and difficulty breathing.   Cardiovascular: Negative for chest pain, palpitations, hypertension and swelling in legs/feet.  Gastrointestinal: Negative for blood in stool, constipation and diarrhea.  Endocrine: Negative for increased urination.  Genitourinary: Negative for painful urination.  Musculoskeletal: Positive for arthralgias, joint pain and morning stiffness. Negative for joint swelling, myalgias, muscle weakness, muscle tenderness and myalgias.  Skin: Negative for color change, pallor,  rash, hair loss, nodules/bumps, skin tightness, ulcers and sensitivity to sunlight.  Allergic/Immunologic: Negative for susceptible to infections.  Neurological: Negative for dizziness, numbness, headaches and weakness.  Hematological: Negative for swollen glands.  Psychiatric/Behavioral: Negative for depressed mood and sleep disturbance. The patient is not nervous/anxious.     PMFS History:  Patient Active Problem List   Diagnosis Date Noted  . Left-sided weakness   . Stroke-like symptoms 02/24/2018  . HTN (hypertension) 02/24/2018  . Primary osteoarthritis of both hands 11/03/2017  . Dupuytren's contracture of left hand 11/03/2017  . DDD (degenerative disc disease), lumbar 11/03/2017  . History of gastric bypass 11/03/2017  . Lymphedema 09/17/2017  . History of vertebral fracture 09/17/2017  . History of osteoporosis 09/17/2017  . Iron deficiency anemia due to chronic blood loss 06/28/2017  . Lower GI bleed 05/31/2017  . Rectal mass 05/31/2017  . Asthma 05/31/2017  . GERD (gastroesophageal reflux disease) 05/31/2017  . Closed fracture of upper end of humerus 09/23/2016  . Tendinitis 09/23/2016  . Patulous eustachian tube of right ear 11/07/2015  . 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 Vitamin D deficiency 08/20/2014  . Secondary hyperparathyroidism ([place]) 08/20/2014    Past Medical History:  Diagnosis Date  . Asthma   . GERD (gastroesophageal reflux disease)   . Internal hemorrhoids   . Iron deficiency anemia   . Osteoporosis   . Peptic ulcer   . Tubular adenoma of colon     Family History  Problem Relation Age of Onset  . Hypertension Other   . Stroke Other   .  Diabetes Other   . Heart attack Other   . Obesity Other   . Diabetes Father   . Heart disease Father   . Colon cancer Father   . Breast cancer Paternal Grandmother   . Healthy Daughter   . Stomach cancer Neg Hx   . Pancreatic cancer Neg Hx    Past Surgical History:   Procedure Laterality Date  . BACK SURGERY    . BREAST SURGERY    . CHOLECYSTECTOMY    . COLONOSCOPY WITH PROPOFOL N/A 06/02/2017   Procedure: COLONOSCOPY WITH PROPOFOL;  Surgeon: Jonathon Bellows, MD;  Location: [facility] ENDOSCOPY;  Service: Gastroenterology;  Laterality: N/A;  . ESOPHAGOGASTRODUODENOSCOPY (EGD) WITH PROPOFOL N/A 07/01/2017   Procedure: ESOPHAGOGASTRODUODENOSCOPY (EGD) WITH PROPOFOL;  Surgeon: Jonathon Bellows, MD;  Location: [facility] ENDOSCOPY;  Service: Gastroenterology;  Laterality: N/A;  . FLEXIBLE SIGMOIDOSCOPY N/A 07/01/2017   Procedure: FLEXIBLE SIGMOIDOSCOPY;  Surgeon: Jonathon Bellows, MD;  Location: [facility] ENDOSCOPY;  Service: Gastroenterology;  Laterality: N/A;  . KNEE SURGERY    . mini gastric bypass    . SHOULDER SURGERY    . TONSILLECTOMY     Social History   Social History Narrative  . Not on file    There is no immunization history on file for this patient.   Objective: Vital Signs: Resp 13   Ht 5\' 3"  (1.6 m)   BMI 29.12 kg/m    Physical Exam Vitals signs and nursing note reviewed.  Constitutional:      Appearance: She is well-developed.  HENT:     Head: Normocephalic and atraumatic.  Eyes:     Conjunctiva/sclera: Conjunctivae normal.  Neck:     Musculoskeletal: Normal range of motion.  Cardiovascular:     Rate and Rhythm: Normal rate and regular rhythm.     Heart sounds: Normal heart sounds.  Pulmonary:     Effort: Pulmonary effort is normal.     Breath sounds: Normal breath sounds.  Abdominal:     General: Bowel sounds are normal.     Palpations: Abdomen is soft.  Lymphadenopathy:     Cervical: No cervical adenopathy.  Skin:    General: Skin is warm and dry.     Capillary Refill: Capillary refill takes less than 2 seconds.  Neurological:     Mental Status: She is alert and oriented to person, place, and time.  Psychiatric:        Behavior: Behavior normal.      Musculoskeletal Exam: Thoracic kyphosis noted.  Limited ROM with discomfort of lumbar  spine.  Shoulder joints, elbow joints, wrist joints, MCPs, PIPs, and DIPs good ROM with no synovitis.  She has PIP and DIP synovial thickening consistent with osteoarthritis.  Right CMC joint tenderness and synovial thickening.  Hip joints, knee joints, ankle joints, MTPs, PIPs, and DIPs good ROM with no synovitis.  No warmth or effusion of knee joints.  No tenderness or swelling of ankle joints.    CDAI Exam: CDAI Score: - Patient Global: -; Provider Global: - Swollen: -; Tender: - Joint Exam   No joint exam has been documented for this visit   There is currently no information documented on the homunculus. Go to the Rheumatology activity and complete the homunculus joint exam.  Investigation: No additional findings.  Imaging: No results found.  Recent Labs: Lab Results  Component Value Date   WBC 5.6 05/01/2019   HGB 13.1 05/01/2019   PLT 175 05/01/2019   NA 141 05/01/2019   K 3.9 05/01/2019  CL 111 05/01/2019   CO2 23 05/01/2019   GLUCOSE 92 05/01/2019   BUN 15 05/01/2019   CREATININE 0.50 05/01/2019   BILITOT 0.5 05/01/2019   ALKPHOS 43 05/01/2019   AST 18 05/01/2019   ALT 21 05/01/2019   PROT 6.1 (L) 05/01/2019   ALBUMIN 3.7 05/01/2019   CALCIUM 8.5 (L) 05/01/2019   GFRAA >60 05/01/2019    Speciality Comments: Osteoporosis followed by her endocrinologist Dr. Buddy Duty.  Procedures:  No procedures performed Allergies: Lac bovis, Prednisone, Sulfa antibiotics, Tape, and Zithromax [azithromycin]   Assessment / Plan:     Visit Diagnoses: Primary osteoarthritis of both hands - She has PIP and DIP synovial thickening assist with osteoarthritis of bilateral hands.  She has right CMC joint synovial thickening and tenderness on exam.  She has been having worsening discomfort and stiffness in both hands first thing in the morning.  Joint protection muscle strengthening were discussed.  She had a right CMC joint cortisone injection on 11/01/2018.  She declined a cortisone  injection today.  She will notify us if her symptoms persist or worsen.  She was encouraged to use Voltaren gel topically and wear CMC joint brace as needed.  Dupuytren's contracture of left hand - She was referred to hand rehab in the past. She has no tenderness at this time.   Arthritis of carpometacarpal (CMC) joint of right thumb -She has tenderness and synovial thickening of the right CMC joint.  She had a right CMC joint cortisone injection on 11/01/18.  She declined an injection today.  She was encouraged to use voltaren gel topically as needed for pain relief.  We discussed using a CMC joint brace as well.   Primary osteoarthritis of left shoulder - She has good ROM on exam today.  No tenderness, warmth, or effusion noted.  She had a left shoulder cortisone injection on 11/01/18, which provided significant pain relief.   DDD (degenerative disc disease), lumbar: Chronic pain   History of osteoporosis - She is receiving prolia 60 mg sq injections every 6 months. She was previously on Forteo.  She is followed by her endocrinologist Dr. Buddy Duty.   History of vertebral fracture -She has no midline spinal tenderness at this time. She is receiving Prolia 60 mg sq injections every 6 months.    Vitamin D deficiency -She is taking a vitamin D supplement.   Gastroesophageal reflux disease, esophagitis presence not specified   History of gastric bypass - 2003.   Secondary hyperparathyroidism ([place])   History of asthma   Iron deficiency anemia due to chronic blood loss   B12 deficiency  Orders: No orders of the defined types were placed in this encounter.  No orders of the defined types were placed in this encounter.     Follow-Up Instructions: Return in about 6 months (around 11/02/2019) for Osteoarthritis, DDD.   Ofilia Neas, PA-C   I examined and evaluated the patient with Hazel Sams PA.  Patient continues to have some discomfort due to osteoarthritis.  She had no synovitis on my  examination today.  Been getting treatment for osteoporosis from Dr. Eulas Post.  The plan of care was discussed as noted above.  Bo Merino, MD  Note - This record has been created using Editor, commissioning.  Chart creation errors have been sought, but may not always  have been located. Such creation errors do not reflect on  the standard of medical care.

## 2019-04-21 ENCOUNTER — Encounter: Payer: Self-pay | Admitting: Physician Assistant

## 2019-05-01 ENCOUNTER — Other Ambulatory Visit: Payer: Self-pay

## 2019-05-01 ENCOUNTER — Inpatient Hospital Stay (HOSPITAL_COMMUNITY): Payer: Medicare Other | Attending: Hematology

## 2019-05-01 DIAGNOSIS — D509 Iron deficiency anemia, unspecified: Secondary | ICD-10-CM | POA: Insufficient documentation

## 2019-05-01 DIAGNOSIS — M818 Other osteoporosis without current pathological fracture: Secondary | ICD-10-CM | POA: Diagnosis not present

## 2019-05-01 DIAGNOSIS — Z9884 Bariatric surgery status: Secondary | ICD-10-CM | POA: Diagnosis not present

## 2019-05-01 DIAGNOSIS — D500 Iron deficiency anemia secondary to blood loss (chronic): Secondary | ICD-10-CM

## 2019-05-01 LAB — CBC WITH DIFFERENTIAL/PLATELET
Abs Immature Granulocytes: 0 10*3/uL (ref 0.00–0.07)
Basophils Absolute: 0 10*3/uL (ref 0.0–0.1)
Basophils Relative: 1 %
Eosinophils Absolute: 0.2 10*3/uL (ref 0.0–0.5)
Eosinophils Relative: 4 %
HCT: 42 % (ref 36.0–46.0)
Hemoglobin: 13.1 g/dL (ref 12.0–15.0)
Immature Granulocytes: 0 %
Lymphocytes Relative: 21 %
Lymphs Abs: 1.2 10*3/uL (ref 0.7–4.0)
MCH: 30.3 pg (ref 26.0–34.0)
MCHC: 31.2 g/dL (ref 30.0–36.0)
MCV: 97.2 fL (ref 80.0–100.0)
Monocytes Absolute: 0.4 10*3/uL (ref 0.1–1.0)
Monocytes Relative: 7 %
Neutro Abs: 3.8 10*3/uL (ref 1.7–7.7)
Neutrophils Relative %: 67 %
Platelets: 175 10*3/uL (ref 150–400)
RBC: 4.32 MIL/uL (ref 3.87–5.11)
RDW: 12.5 % (ref 11.5–15.5)
WBC: 5.6 10*3/uL (ref 4.0–10.5)
nRBC: 0 % (ref 0.0–0.2)

## 2019-05-01 LAB — COMPREHENSIVE METABOLIC PANEL
ALT: 21 U/L (ref 0–44)
AST: 18 U/L (ref 15–41)
Albumin: 3.7 g/dL (ref 3.5–5.0)
Alkaline Phosphatase: 43 U/L (ref 38–126)
Anion gap: 7 (ref 5–15)
BUN: 15 mg/dL (ref 8–23)
CO2: 23 mmol/L (ref 22–32)
Calcium: 8.5 mg/dL — ABNORMAL LOW (ref 8.9–10.3)
Chloride: 111 mmol/L (ref 98–111)
Creatinine, Ser: 0.5 mg/dL (ref 0.44–1.00)
GFR calc Af Amer: >60 mL/min (ref >60)
GFR calc non Af Amer: >60 mL/min (ref >60)
Glucose, Bld: 92 mg/dL (ref 70–99)
Potassium: 3.9 mmol/L (ref 3.5–5.1)
Sodium: 141 mmol/L (ref 135–145)
Total Bilirubin: 0.5 mg/dL (ref 0.3–1.2)
Total Protein: 6.1 g/dL — ABNORMAL LOW (ref 6.5–8.1)

## 2019-05-01 LAB — IRON AND TIBC
Iron: 90 ug/dL (ref 28–170)
Saturation Ratios: 26 % (ref 10.4–31.8)
TIBC: 347 ug/dL (ref 250–450)
UIBC: 257 ug/dL

## 2019-05-01 LAB — LACTATE DEHYDROGENASE: LDH: 144 U/L (ref 98–192)

## 2019-05-01 LAB — VITAMIN B12: Vitamin B-12: 426 pg/mL (ref 180–914)

## 2019-05-01 LAB — FOLATE: Folate: 11.5 ng/mL (ref >5.9)

## 2019-05-01 LAB — FERRITIN: Ferritin: 108 ng/mL (ref 11–307)

## 2019-05-02 ENCOUNTER — Encounter: Payer: Self-pay | Admitting: Rheumatology

## 2019-05-02 ENCOUNTER — Ambulatory Visit (INDEPENDENT_AMBULATORY_CARE_PROVIDER_SITE_OTHER): Payer: Medicare Other | Admitting: Rheumatology

## 2019-05-02 VITALS — BP 104/75 | HR 68 | Resp 13 | Ht 63.0 in | Wt 180.0 lb

## 2019-05-02 DIAGNOSIS — N2581 Secondary hyperparathyroidism of renal origin: Secondary | ICD-10-CM

## 2019-05-02 DIAGNOSIS — M5136 Other intervertebral disc degeneration, lumbar region: Secondary | ICD-10-CM

## 2019-05-02 DIAGNOSIS — M19012 Primary osteoarthritis, left shoulder: Secondary | ICD-10-CM

## 2019-05-02 DIAGNOSIS — M19042 Primary osteoarthritis, left hand: Secondary | ICD-10-CM

## 2019-05-02 DIAGNOSIS — M19041 Primary osteoarthritis, right hand: Secondary | ICD-10-CM | POA: Diagnosis not present

## 2019-05-02 DIAGNOSIS — E538 Deficiency of other specified B group vitamins: Secondary | ICD-10-CM

## 2019-05-02 DIAGNOSIS — E559 Vitamin D deficiency, unspecified: Secondary | ICD-10-CM

## 2019-05-02 DIAGNOSIS — M1811 Unilateral primary osteoarthritis of first carpometacarpal joint, right hand: Secondary | ICD-10-CM | POA: Diagnosis not present

## 2019-05-02 DIAGNOSIS — Z8709 Personal history of other diseases of the respiratory system: Secondary | ICD-10-CM

## 2019-05-02 DIAGNOSIS — K219 Gastro-esophageal reflux disease without esophagitis: Secondary | ICD-10-CM

## 2019-05-02 DIAGNOSIS — Z8781 Personal history of (healed) traumatic fracture: Secondary | ICD-10-CM

## 2019-05-02 DIAGNOSIS — D500 Iron deficiency anemia secondary to blood loss (chronic): Secondary | ICD-10-CM

## 2019-05-02 DIAGNOSIS — Z9884 Bariatric surgery status: Secondary | ICD-10-CM

## 2019-05-02 DIAGNOSIS — M720 Palmar fascial fibromatosis [Dupuytren]: Secondary | ICD-10-CM | POA: Diagnosis not present

## 2019-05-02 DIAGNOSIS — Z8739 Personal history of other diseases of the musculoskeletal system and connective tissue: Secondary | ICD-10-CM

## 2019-05-08 ENCOUNTER — Inpatient Hospital Stay (HOSPITAL_BASED_OUTPATIENT_CLINIC_OR_DEPARTMENT_OTHER): Payer: Medicare Other | Admitting: Hematology

## 2019-05-08 ENCOUNTER — Encounter (HOSPITAL_COMMUNITY): Payer: Self-pay | Admitting: Hematology

## 2019-05-08 ENCOUNTER — Other Ambulatory Visit: Payer: Self-pay

## 2019-05-08 VITALS — BP 123/69 | HR 74 | Temp 97.9°F | Resp 18 | Wt 180.0 lb

## 2019-05-08 DIAGNOSIS — D500 Iron deficiency anemia secondary to blood loss (chronic): Secondary | ICD-10-CM

## 2019-05-08 DIAGNOSIS — D509 Iron deficiency anemia, unspecified: Secondary | ICD-10-CM | POA: Diagnosis not present

## 2019-05-08 NOTE — Progress Notes (Signed)
[place], [place] [address]   CLINIC:  Medical Oncology/Hematology  PCP:  [place] [address] [place] [address] [phone]   REASON FOR VISIT: Follow-up for iron deficiency anemia  CURRENT THERAPY: intermittent feraheme infusions   INTERVAL HISTORY:  [name] 68 y.o. female seen for follow-up of iron deficiency anemia.  Appetite and energy levels are 100%.  Denies any bleeding per rectum or melena.  Denies any change in bowel habits.  Leg swelling has been stable.  No pains reported.  Last Feraheme infusion was in January 2020.  Denies any fevers, night sweats or weight loss.  Denies any recent hospitalizations or ER visits.  No lightheadedness or chest pains reported.   REVIEW OF SYSTEMS:  Review of Systems  Cardiovascular: Positive for leg swelling.  Psychiatric/Behavioral: Positive for sleep disturbance.  All other systems reviewed and are negative.    PAST MEDICAL/SURGICAL HISTORY:  Past Medical History:  Diagnosis Date  . Asthma   . GERD (gastroesophageal reflux disease)   . Internal hemorrhoids   . Iron deficiency anemia   . Osteoporosis   . Peptic ulcer   . Tubular adenoma of colon    Past Surgical History:  Procedure Laterality Date  . BACK SURGERY    . BREAST SURGERY    . CHOLECYSTECTOMY    . COLONOSCOPY WITH PROPOFOL N/A 06/02/2017   Procedure: COLONOSCOPY WITH PROPOFOL;  Surgeon: Jonathon Bellows, MD;  Location: [facility] ENDOSCOPY;  Service: Gastroenterology;  Laterality: N/A;  . ESOPHAGOGASTRODUODENOSCOPY (EGD) WITH PROPOFOL N/A 07/01/2017   Procedure: ESOPHAGOGASTRODUODENOSCOPY (EGD) WITH PROPOFOL;  Surgeon: Jonathon Bellows, MD;  Location: [facility] ENDOSCOPY;  Service: Gastroenterology;  Laterality: N/A;  . FLEXIBLE SIGMOIDOSCOPY N/A 07/01/2017   Procedure: FLEXIBLE SIGMOIDOSCOPY;  Surgeon: Jonathon Bellows, MD;  Location: [facility] ENDOSCOPY;  Service: Gastroenterology;  Laterality: N/A;  . KNEE  SURGERY    . mini gastric bypass    . SHOULDER SURGERY    . TONSILLECTOMY       SOCIAL HISTORY:  Social History   Socioeconomic History  . Marital status: Married    Spouse name: Not on file  . Number of children: Not on file  . Years of education: Not on file  . Highest education level: Not on file  Occupational History  . Not on file  Social Needs  . Financial resource strain: Not on file  . Food insecurity    Worry: Not on file    Inability: Not on file  . Transportation needs    Medical: Not on file    Non-medical: Not on file  Tobacco Use  . Smoking status: Never Smoker  . Smokeless tobacco: Never Used  Substance and Sexual Activity  . Alcohol use: No    Alcohol/week: 0.0 standard drinks  . Drug use: Never  . Sexual activity: Never  Lifestyle  . Physical activity    Days per week: Not on file    Minutes per session: Not on file  . Stress: Not on file  Relationships  . Social Herbalist on phone: Not on file    Gets together: Not on file    Attends religious service: Not on file    Active member of club or organization: Not on file    Attends meetings of clubs or organizations: Not on file    Relationship status: Not on file  . Intimate partner violence    Fear of current or ex  partner: Not on file    Emotionally abused: Not on file    Physically abused: Not on file    Forced sexual activity: Not on file  Other Topics Concern  . Not on file  Social History Narrative  . Not on file    FAMILY HISTORY:  Family History  Problem Relation Age of Onset  . Hypertension Other   . Stroke Other   . Diabetes Other   . Heart attack Other   . Obesity Other   . Diabetes Father   . Heart disease Father   . Colon cancer Father   . Breast cancer Paternal Grandmother   . Healthy Daughter   . Stomach cancer Neg Hx   . Pancreatic cancer Neg Hx     CURRENT MEDICATIONS:  Outpatient Encounter Medications as of 05/08/2019  Medication Sig  . calcitRIOL  (ROCALTROL) 0.5 MCG capsule Take 0.5 mcg by mouth daily.  . Cholecalciferol (VITAMIN D3) 5000 UNITS CAPS Take 1 capsule by mouth daily.   Marland Kitchen denosumab (PROLIA) 60 MG/ML SOSY injection Inject 60 mg into the skin every 6 (six) months.  . Melatonin 5 MG CAPS Take by mouth as needed.   . Multiple Vitamin (MULTIVITAMIN) tablet Take 1 tablet by mouth 2 (two) times daily.   . pantoprazole (PROTONIX) 40 MG tablet Take 40 mg by mouth every morning.  Marland Kitchen VITAMIN A PO Take 1 capsule by mouth daily.   Marland Kitchen VITAMIN E PO Take 1 capsule by mouth daily.    No facility-administered encounter medications on file as of 05/08/2019.     ALLERGIES:  Allergies  Allergen Reactions  . Lac Bovis Diarrhea  . Prednisone Diarrhea  . Sulfa Antibiotics Itching  . Tape Dermatitis  . Zithromax [Azithromycin] Diarrhea     PHYSICAL EXAM:  ECOG Performance status: 1  Vitals:   05/08/19 1441  BP: 123/69  Pulse: 74  Resp: 18  Temp: 97.9 F (36.6 C)  SpO2: 98%   Filed Weights   05/08/19 1441  Weight: 180 lb (81.6 kg)    Physical Exam Constitutional:      Appearance: Normal appearance. She is normal weight.  Cardiovascular:     Rate and Rhythm: Normal rate and regular rhythm.     Heart sounds: Normal heart sounds.  Pulmonary:     Effort: Pulmonary effort is normal.     Breath sounds: Normal breath sounds.  Abdominal:     General: There is no distension.     Palpations: Abdomen is soft. There is no mass.  Musculoskeletal: Normal range of motion.     Right lower leg: Edema present.     Left lower leg: Edema present.  Skin:    General: Skin is warm and dry.  Neurological:     Mental Status: She is alert and oriented to person, place, and time. Mental status is at baseline.  Psychiatric:        Mood and Affect: Mood normal.        Behavior: Behavior normal.      LABORATORY DATA:  I have reviewed the labs as listed.  CBC    Component Value Date/Time   WBC 5.6 05/01/2019 1217   RBC 4.32 05/01/2019  1217   HGB 13.1 05/01/2019 1217   HGB 10.8 (L) 09/25/2016 1518   HCT 42.0 05/01/2019 1217   HCT 32.6 (L) 09/25/2016 1518   PLT 175 05/01/2019 1217   PLT 264 09/25/2016 1518   MCV 97.2 05/01/2019 1217   MCV  96 09/25/2016 1518   MCH 30.3 05/01/2019 1217   MCHC 31.2 05/01/2019 1217   RDW 12.5 05/01/2019 1217   RDW 15.5 (H) 09/25/2016 1518   LYMPHSABS 1.2 05/01/2019 1217   LYMPHSABS 1.2 09/25/2016 1518   MONOABS 0.4 05/01/2019 1217   EOSABS 0.2 05/01/2019 1217   EOSABS 0.1 09/25/2016 1518   BASOSABS 0.0 05/01/2019 1217   BASOSABS 0.0 09/25/2016 1518   CMP Latest Ref Rng & Units 05/01/2019 09/21/2018 07/15/2018  Glucose 70 - 99 mg/dL 92 101(H) -  BUN 8 - 23 mg/dL 15 15 -  Creatinine 0.44 - 1.00 mg/dL 0.50 0.96 -  Sodium 135 - 145 mmol/L 141 139 -  Potassium 3.5 - 5.1 mmol/L 3.9 4.4 -  Chloride 98 - 111 mmol/L 111 106 -  CO2 22 - 32 mmol/L 23 26 -  Calcium 8.9 - 10.3 mg/dL 8.5(L) 8.1(L) -  Total Protein 6.5 - 8.1 g/dL 6.1(L) 5.3(L) 5.6(L)  Total Bilirubin 0.3 - 1.2 mg/dL 0.5 0.6 0.9  Alkaline Phos 38 - 126 U/L 43 66 72  AST 15 - 41 U/L 18 22 21   ALT 0 - 44 U/L 21 27 22        DIAGNOSTIC IMAGING:  I have independently reviewed the scans and discussed with the patient.     ASSESSMENT & PLAN:   Iron deficiency anemia due to chronic blood loss 1.  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 - Colonoscopy on 06/02/2017 at [facility] showed small mouth diverticula in the sigmoid colon with nonbleeding internal hemorrhoids. - Last Feraheme infusion on 09/28/2018. - She denies any bleeding per rectum or melena.  We reviewed labs from 05/01/2019.  Hemoglobin was 13.1, ferritin was 108.  Percent saturation was 26.  B12 was 426. -She does not require any iron therapy at this time.  We will plan to check her back in 4 months with repeat labs.  2.  Health maintenance: - Mammogram on 03/31/2019 reviewed by me was BI-RADS  1.      Orders placed this encounter:  Orders Placed This Encounter  Procedures  . CBC with Differential/Platelet  . Comprehensive metabolic panel  . Iron and TIBC  . Ferritin  . Vitamin B12  . Folate  . Lactate dehydrogenase      Derek Jack, MD [place] [phone]

## 2019-05-08 NOTE — Assessment & Plan Note (Signed)
1.  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 - Colonoscopy on 06/02/2017 at [place] showed small mouth diverticula in the sigmoid colon with nonbleeding internal hemorrhoids. - Last Feraheme infusion on 09/28/2018. - She denies any bleeding per rectum or melena.  We reviewed labs from 05/01/2019.  Hemoglobin was 13.1, ferritin was 108.  Percent saturation was 26.  B12 was 426. -She does not require any iron therapy at this time.  We will plan to check her back in 4 months with repeat labs.  2.  Health maintenance: - Mammogram on 03/31/2019 reviewed by me was BI-RADS 1.

## 2019-05-08 NOTE — Patient Instructions (Signed)
[facility] at [facility] Discharge Instructions  You were seen today by Dr. Katragadda. He went over your recent lab results. He will see you back in 4 months for labs and follow up.   Thank you for choosing  Can[facility] Center at [facility] to provide your oncology and hematology care.  To afford each patient quality time with our provider, please arrive at least 15 minutes before your scheduled appointment time.   If you have a lab appointment with the Cancer Center please come in thru the  Main Entrance and check in at the main information desk  You need to re-schedule your appointment should you arrive 10 or more minutes late.  We strive to give you quality time with our providers, and arriving late affects you and other patients whose appointments are after yours.  Also, if you no show three or more times for appointments you may be dismissed from the clinic at the providers discretion.     Again, thank you for choosing [facility].  Our hope is that these requests will decrease the amount of time that you wait before being seen by our physicians.       _____________________________________________________________  Should you have questions after your visit to [facility], please contact our office at [phone] between the hours of 8:00 a.m. and 4:30 p.m.  Voicemails left after 4:00 p.m. will not be returned until the following business day.  For prescription refill requests, have your pharmacy contact our office and allow 72 hours.    Cancer Center Support Programs:   > Cancer Support Group  2nd Tuesday of the month 1pm-2pm, Journey Room    

## 2019-09-05 ENCOUNTER — Inpatient Hospital Stay (HOSPITAL_COMMUNITY): Payer: Medicare Other | Attending: Hematology

## 2019-09-05 ENCOUNTER — Other Ambulatory Visit (HOSPITAL_COMMUNITY): Payer: Medicare Other

## 2019-09-05 ENCOUNTER — Other Ambulatory Visit: Payer: Self-pay

## 2019-09-05 DIAGNOSIS — D500 Iron deficiency anemia secondary to blood loss (chronic): Secondary | ICD-10-CM | POA: Insufficient documentation

## 2019-09-05 DIAGNOSIS — K909 Intestinal malabsorption, unspecified: Secondary | ICD-10-CM | POA: Diagnosis not present

## 2019-09-05 LAB — CBC WITH DIFFERENTIAL/PLATELET
Abs Immature Granulocytes: 0.01 10*3/uL (ref 0.00–0.07)
Basophils Absolute: 0 10*3/uL (ref 0.0–0.1)
Basophils Relative: 1 %
Eosinophils Absolute: 0.2 10*3/uL (ref 0.0–0.5)
Eosinophils Relative: 5 %
HCT: 41 % (ref 36.0–46.0)
Hemoglobin: 13 g/dL (ref 12.0–15.0)
Immature Granulocytes: 0 %
Lymphocytes Relative: 22 %
Lymphs Abs: 1 10*3/uL (ref 0.7–4.0)
MCH: 31.4 pg (ref 26.0–34.0)
MCHC: 31.7 g/dL (ref 30.0–36.0)
MCV: 99 fL (ref 80.0–100.0)
Monocytes Absolute: 0.4 10*3/uL (ref 0.1–1.0)
Monocytes Relative: 9 %
Neutro Abs: 2.9 10*3/uL (ref 1.7–7.7)
Neutrophils Relative %: 63 %
Platelets: 176 10*3/uL (ref 150–400)
RBC: 4.14 MIL/uL (ref 3.87–5.11)
RDW: 12.7 % (ref 11.5–15.5)
WBC: 4.5 10*3/uL (ref 4.0–10.5)
nRBC: 0 % (ref 0.0–0.2)

## 2019-09-05 LAB — COMPREHENSIVE METABOLIC PANEL
ALT: 41 U/L (ref 0–44)
AST: 58 U/L — ABNORMAL HIGH (ref 15–41)
Albumin: 3.7 g/dL (ref 3.5–5.0)
Alkaline Phosphatase: 44 U/L (ref 38–126)
Anion gap: 8 (ref 5–15)
BUN: 10 mg/dL (ref 8–23)
CO2: 28 mmol/L (ref 22–32)
Calcium: 8 mg/dL — ABNORMAL LOW (ref 8.9–10.3)
Chloride: 104 mmol/L (ref 98–111)
Creatinine, Ser: 0.5 mg/dL (ref 0.44–1.00)
GFR calc Af Amer: >60 mL/min (ref >60)
GFR calc non Af Amer: >60 mL/min (ref >60)
Glucose, Bld: 98 mg/dL (ref 70–99)
Potassium: 3.8 mmol/L (ref 3.5–5.1)
Sodium: 140 mmol/L (ref 135–145)
Total Bilirubin: 0.8 mg/dL (ref 0.3–1.2)
Total Protein: 6 g/dL — ABNORMAL LOW (ref 6.5–8.1)

## 2019-09-05 LAB — FOLATE: Folate: 16.5 ng/mL (ref >5.9)

## 2019-09-05 LAB — IRON AND TIBC
Iron: 71 ug/dL (ref 28–170)
Saturation Ratios: 21 % (ref 10.4–31.8)
TIBC: 343 ug/dL (ref 250–450)
UIBC: 272 ug/dL

## 2019-09-05 LAB — FERRITIN: Ferritin: 112 ng/mL (ref 11–307)

## 2019-09-05 LAB — VITAMIN B12: Vitamin B-12: 508 pg/mL (ref 180–914)

## 2019-09-05 LAB — LACTATE DEHYDROGENASE: LDH: 190 U/L (ref 98–192)

## 2019-09-12 ENCOUNTER — Ambulatory Visit (HOSPITAL_COMMUNITY): Payer: Medicare Other | Admitting: Hematology

## 2019-09-18 ENCOUNTER — Ambulatory Visit (HOSPITAL_COMMUNITY): Payer: Medicare Other | Admitting: Hematology

## 2019-09-19 ENCOUNTER — Inpatient Hospital Stay (HOSPITAL_COMMUNITY): Payer: Medicare Other | Attending: Hematology | Admitting: Hematology

## 2019-09-19 ENCOUNTER — Encounter (HOSPITAL_COMMUNITY): Payer: Self-pay | Admitting: Hematology

## 2019-09-19 DIAGNOSIS — D500 Iron deficiency anemia secondary to blood loss (chronic): Secondary | ICD-10-CM | POA: Diagnosis not present

## 2019-09-19 DIAGNOSIS — K909 Intestinal malabsorption, unspecified: Secondary | ICD-10-CM | POA: Insufficient documentation

## 2019-09-19 DIAGNOSIS — Z9884 Bariatric surgery status: Secondary | ICD-10-CM | POA: Insufficient documentation

## 2019-09-19 NOTE — Progress Notes (Signed)
Virtual Visit via Telephone Note  I connected with [name] on 09/19/19 at  2:35 PM EST by telephone and verified that I am speaking with the correct person using two identifiers.   I discussed the limitations, risks, security and privacy concerns of performing an evaluation and management service by telephone and the availability of in person appointments. I also discussed with the patient that there may be a patient responsible charge related to this service. The patient expressed understanding and agreed to proceed.   History of Present Illness: She is being followed in our clinic for iron deficiency anemia.  Last colonoscopy was in 2018 at [facility] which showed small mouth diverticuli in the sigmoid colon with nonbleeding internal hemorrhoids.  Last Feraheme infusion was in January 2020.   Observations/Objective: She denies any bleeding per rectum or melena.  Denies any nausea, vomiting or diarrhea.  Reports appetite 800%.  However reports energy levels low at 50%.  She gets tired easily when she is cooking in the afternoon.  Denies any fevers, night sweats or weight loss in the last 6 months.  Assessment and Plan:  1.  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 -Colonoscopy in September 2018 did not show any major bleeding sources. -Last Shirlean Kelly was on September 28, 2018. -She complains of feeling very tired particularly in the afternoons with minimal work. -I have recommended 1 more infusion of Feraheme to improve her energy levels. -She reports that improvement in energy lasts about 3 to 4 months. -We reviewed all her labs with her.  There is mild elevation of AST which will be followed on subsequent labs.  I have counseled her to avoid Tylenol.   Follow Up Instructions: RTC 6 months with labs 1 week prior.   I discussed the assessment and treatment plan with the patient. The patient was provided an  opportunity to ask questions and all were answered. The patient agreed with the plan and demonstrated an understanding of the instructions.   The patient was advised to call back or seek an in-person evaluation if the symptoms worsen or if the condition fails to improve as anticipated.  I provided 11 minutes of non-face-to-face time during this encounter.   Derek Jack, MD

## 2019-09-22 ENCOUNTER — Ambulatory Visit (HOSPITAL_COMMUNITY): Payer: Medicare Other

## 2019-09-22 ENCOUNTER — Telehealth: Payer: Self-pay | Admitting: Cardiovascular Disease

## 2019-09-22 NOTE — Telephone Encounter (Signed)

## 2019-09-26 ENCOUNTER — Encounter: Payer: Self-pay | Admitting: Cardiovascular Disease

## 2019-09-26 ENCOUNTER — Telehealth (INDEPENDENT_AMBULATORY_CARE_PROVIDER_SITE_OTHER): Payer: Medicare Other | Admitting: Cardiovascular Disease

## 2019-09-26 VITALS — Ht 64.0 in | Wt 178.0 lb

## 2019-09-26 DIAGNOSIS — R002 Palpitations: Secondary | ICD-10-CM

## 2019-09-26 NOTE — Progress Notes (Signed)
Virtual Visit via Telephone Note   This visit type was conducted due to national recommendations for restrictions regarding the COVID-19 Pandemic (e.g. social distancing) in an effort to limit this patient's exposure and mitigate transmission in our community.  Due to her co-morbid illnesses, this patient is at least at moderate risk for complications without adequate follow up.  This format is felt to be most appropriate for this patient at this time.  The patient did not have access to video technology/had technical difficulties with video requiring transitioning to audio format only (telephone).  All issues noted in this document were discussed and addressed.  No physical exam could be performed with this format.  Please refer to the patient's chart for her  consent to telehealth for [facility].   Date:  09/26/2019   ID:  [name], DOB 05/16/1951, MRN [id number]  Patient Location: Home Provider Location: Office  PCP:  Mar Daring, PA-C  Cardiologist:  Kate Sable, MD  Electrophysiologist:  None   Evaluation Performed:  Follow-Up Visit  Chief Complaint: Palpitations  History of Present Illness:    [name] is a 69 y.o. female with palpitations.  In summary, she was hospitalized in June 2019 for strokelike symptoms with negative head CT and MRI/MRA.  Carotid Dopplers showed mild carotid atherosclerosis with less than 50% stenosis bilaterally.  There were no abnormalities on telemetry.  She was discharged on aspirin 81 mg for secondary prevention.  Nuclear stress test on 12/07/2017 showed defects with significant degree of soft tissue attenuation but a mild degree of septal ischemia could not entirely be ruled out. While left ventricular systolic function appeared grossly normal, LVEF is calculated at 25%.  Echocardiogram however demonstrated normal left ventricular systolic and diastolic function and normal regional wall motion, LVEF 55 to 60%.  There was mild mitral and aortic regurgitation and mild left atrial dilatation.  Event monitoring demonstrated sinus rhythm with PACs and atrial runs.  She seldom has palpitations. She denies chest pain and shortness of breath. She has chronic lymphedema. She has a leg pump and support stockings.  She and her husband spend time [place] in [place], [place].   Past Medical History:  Diagnosis Date  . Asthma   . GERD (gastroesophageal reflux disease)   . Internal hemorrhoids   . Iron deficiency anemia   . Osteoporosis   . Peptic ulcer   . Tubular adenoma of colon    Past Surgical History:  Procedure Laterality Date  . BACK SURGERY    . BREAST SURGERY    . CHOLECYSTECTOMY    . COLONOSCOPY WITH PROPOFOL N/A 06/02/2017   Procedure: COLONOSCOPY WITH PROPOFOL;  Surgeon: Jonathon Bellows, MD;  Location: [facility] ENDOSCOPY;  Service: Gastroenterology;  Laterality: N/A;  . ESOPHAGOGASTRODUODENOSCOPY (EGD) WITH PROPOFOL N/A 07/01/2017   Procedure: ESOPHAGOGASTRODUODENOSCOPY (EGD) WITH PROPOFOL;  Surgeon: Jonathon Bellows, MD;  Location[facility] ENDOSCOPY;  Service: Gastroenterology;  Laterality: N/A;  . FLEXIBLE SIGMOIDOSCOPY N/A 07/01/2017   Procedure: FLEXIBLE SIGMOIDOSCOPY;  Surgeon: Jonathon Bellows, MD;  Location: [facility] ENDOSCOPY;  Service: Gastroenterology;  Laterality: N/A;  . KNEE SURGERY    . mini gastric bypass    . SHOULDER SURGERY    . TONSILLECTOMY       Current Meds  Medication Sig  . calcitRIOL (ROCALTROL) 0.5 MCG capsule Take 0.5 mcg by mouth daily.  . Cholecalciferol (VITAMIN D3) 5000 UNITS CAPS Take 1 capsule by mouth daily.   Marland Kitchen denosumab (PROLIA)  60 MG/ML SOSY injection Inject 60 mg into the skin every 6 (six) months.  . diphenhydrAMINE (BENADRYL) 25 MG tablet Take 25 mg by mouth at bedtime as needed.   . Melatonin 5 MG CAPS Take by mouth as needed.   . Multiple Vitamin (MULTIVITAMIN) tablet Take 1 tablet by mouth 2 (two) times daily.   . pantoprazole  (PROTONIX) 40 MG tablet Take 40 mg by mouth every morning.  Marland Kitchen VITAMIN A PO Take 1 capsule by mouth daily.   Marland Kitchen VITAMIN E PO Take 1 capsule by mouth daily.      Allergies:   Lac bovis, Prednisone, Sulfa antibiotics, Tape, and Zithromax [azithromycin]   Social History   Tobacco Use  . Smoking status: Never Smoker  . Smokeless tobacco: Never Used  Substance Use Topics  . Alcohol use: No    Alcohol/week: 0.0 standard drinks  . Drug use: Never     Family Hx: The patient's family history includes Breast cancer in her paternal grandmother; Colon cancer in her father; Diabetes in her father and another family member; Healthy in her daughter; Heart attack in an other family member; Heart disease in her father; Hypertension in an other family member; Obesity in an other family member; Stroke in an other family member. There is no history of Stomach cancer or Pancreatic cancer.  ROS:   Please see the history of present illness.     All other systems reviewed and are negative.   Prior CV studies:   The following studies were reviewed today:  Reviewed above  Labs/Other Tests and Data Reviewed:    EKG:  No ECG reviewed.  Recent Labs: 09/05/2019: ALT 41; BUN 10; Creatinine, Ser 0.50; Hemoglobin 13.0; Platelets 176; Potassium 3.8; Sodium 140   Recent Lipid Panel Lab Results  Component Value Date/Time   CHOL 126 02/25/2018 06:12 AM   TRIG 35 02/25/2018 06:12 AM   HDL 58 02/25/2018 06:12 AM   CHOLHDL 2.2 02/25/2018 06:12 AM   LDLCALC 61 02/25/2018 06:12 AM    Wt Readings from Last 3 Encounters:  09/26/19 178 lb (80.7 kg)  05/08/19 180 lb (81.6 kg)  05/02/19 180 lb (81.6 kg)     Objective:    Vital Signs:  Ht 5\' 4"  (1.626 m)   Wt 178 lb (80.7 kg)   BMI 30.55 kg/m    VITAL SIGNS:  reviewed  ASSESSMENT & PLAN:    1.  Palpitations: She was previously metoprolol but stopped taking it on her own. She seldom experiences palpitations.    COVID-19 Education: The signs and  symptoms of COVID-19 were discussed with the patient and how to seek care for testing (follow up with PCP or arrange E-visit).  The importance of social distancing was discussed today.  Time:   Today, I have spent 5 minutes with the patient with telehealth technology discussing the above problems.     Medication Adjustments/Labs and Tests Ordered: Current medicines are reviewed at length with the patient today.  Concerns regarding medicines are outlined above.   Tests Ordered: No orders of the defined types were placed in this encounter.   Medication Changes: No orders of the defined types were placed in this encounter.   Follow Up:  Virtual Visit  prn  Signed, Kate Sable, MD  09/26/2019 9:36 AM    [place]

## 2019-09-26 NOTE — Patient Instructions (Signed)
Your physician recommends that you schedule a follow-up appointment in: AS NEEDED WITH DR KONESWARAN  Your physician recommends that you continue on your current medications as directed. Please refer to the Current Medication list given to you today.  Thank you for choosing [facility]!!    

## 2019-09-29 ENCOUNTER — Inpatient Hospital Stay (HOSPITAL_COMMUNITY): Payer: Medicare Other

## 2019-09-29 ENCOUNTER — Encounter (HOSPITAL_COMMUNITY): Payer: Self-pay

## 2019-09-29 ENCOUNTER — Other Ambulatory Visit: Payer: Self-pay

## 2019-09-29 VITALS — BP 98/69 | HR 58 | Temp 97.6°F | Resp 18

## 2019-09-29 DIAGNOSIS — K922 Gastrointestinal hemorrhage, unspecified: Secondary | ICD-10-CM

## 2019-09-29 DIAGNOSIS — D500 Iron deficiency anemia secondary to blood loss (chronic): Secondary | ICD-10-CM

## 2019-09-29 DIAGNOSIS — Z9884 Bariatric surgery status: Secondary | ICD-10-CM | POA: Diagnosis not present

## 2019-09-29 DIAGNOSIS — K909 Intestinal malabsorption, unspecified: Secondary | ICD-10-CM | POA: Diagnosis not present

## 2019-09-29 MED ORDER — SODIUM CHLORIDE 0.9 % IV SOLN
510.0000 mg | Freq: Once | INTRAVENOUS | Status: AC
  Administered 2019-09-29: 510 mg via INTRAVENOUS
  Filled 2019-09-29: qty 17

## 2019-09-29 MED ORDER — SODIUM CHLORIDE 0.9 % IV SOLN: Freq: Once | INTRAVENOUS | Status: AC

## 2019-09-29 NOTE — Patient Instructions (Signed)
[facility] at [facility]  Discharge Instructions:   _______________________________________________________________  Thank you for choosing [facility] at [facility] to provide your oncology and hematology care.  To afford each patient quality time with our providers, please arrive at least 15 minutes before your scheduled appointment.  You need to re-schedule your appointment if you arrive 10 or more minutes late.  We strive to give you quality time with our providers, and arriving late affects you and other patients whose appointments are after yours.  Also, if you no show three or more times for appointments you may be dismissed from the clinic.  Again, thank you for choosing [facility] at [facility]. Our hope is that these requests will allow you access to exceptional care and in a timely manner. _______________________________________________________________  If you have questions after your visit, please contact our office at [phone] between the hours of 8:30 a.m. and 5:00 p.m. Voicemails left after 4:30 p.m. will not be returned until the following business day. _______________________________________________________________  For prescription refill requests, have your pharmacy contact our office. _______________________________________________________________  Recommendations made by the consultant and any test results will be sent to your referring physician. _______________________________________________________________ 

## 2019-09-29 NOTE — Progress Notes (Unsigned)
Patient here for Feraheme infusion. MAR reviewed and updated. Vital signs stable. Patient has no complaints of any changes since the last visit.   Feraheme given today per MD orders. Tolerated infusion without adverse affects. Vital signs stable. No complaints at this time. Discharged from clinic ambulatory. F/U with [facility] as scheduled.

## 2019-10-27 NOTE — Progress Notes (Deleted)
Office Visit Note  Patient: [name]             Date of Birth: 1950/12/26           MRN: [id number]             PCP: Mar Daring, PA-C Referring: Florian Buff* Visit Date: 10/31/2019 Occupation: @GUAROCC @  Subjective:  No chief complaint on file.   History of Present Illness: [name] is a 69 y.o. female ***   Activities of Daily Living:  Patient reports morning stiffness for *** {minute/hour:19697}.   Patient {ACTIONS;DENIES/REPORTS:21021675::"Denies"} nocturnal pain.  Difficulty dressing/grooming: {ACTIONS;DENIES/REPORTS:21021675::"Denies"} Difficulty climbing stairs: {ACTIONS;DENIES/REPORTS:21021675::"Denies"} Difficulty getting out of chair: {ACTIONS;DENIES/REPORTS:21021675::"Denies"} Difficulty using hands for taps, buttons, cutlery, and/or writing: {ACTIONS;DENIES/REPORTS:21021675::"Denies"}  No Rheumatology ROS completed.   PMFS History:  Patient Active Problem List   Diagnosis Date Noted  . Left-sided weakness   . Stroke-like symptoms 02/24/2018  . HTN (hypertension) 02/24/2018  . Primary osteoarthritis of both hands 11/03/2017  . Dupuytren's contracture of left hand 11/03/2017  . DDD (degenerative disc disease), lumbar 11/03/2017  . History of gastric bypass 11/03/2017  . Lymphedema 09/17/2017  . History of vertebral fracture 09/17/2017  . History of osteoporosis 09/17/2017  . Iron deficiency anemia due to chronic blood loss 06/28/2017  . Lower GI bleed 05/31/2017  . Rectal mass 05/31/2017  . Asthma 05/31/2017  . GERD (gastroesophageal reflux disease) 05/31/2017  . Closed fracture of upper end of humerus 09/23/2016  . Tendinitis 09/23/2016  . Patulous eustachian tube of right ear 11/07/2015  . 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 Vitamin D deficiency 08/20/2014  . Secondary hyperparathyroidism ([place]) 08/20/2014    Past Medical History:  Diagnosis Date  . Asthma   . GERD  (gastroesophageal reflux disease)   . Internal hemorrhoids   . Iron deficiency anemia   . Osteoporosis   . Peptic ulcer   . Tubular adenoma of colon     Family History  Problem Relation Age of Onset  . Hypertension Other   . Stroke Other   . Diabetes Other   . Heart attack Other   . Obesity Other   . Diabetes Father   . Heart disease Father   . Colon cancer Father   . Breast cancer Paternal Grandmother   . Healthy Daughter   . Stomach cancer Neg Hx   . Pancreatic cancer Neg Hx    Past Surgical History:  Procedure Laterality Date  . BACK SURGERY    . BREAST SURGERY    . CHOLECYSTECTOMY    . COLONOSCOPY WITH PROPOFOL N/A 06/02/2017   Procedure: COLONOSCOPY WITH PROPOFOL;  Surgeon: Jonathon Bellows, MD;  Location: [facility] ENDOSCOPY;  Service: Gastroenterology;  Laterality: N/A;  . ESOPHAGOGASTRODUODENOSCOPY (EGD) WITH PROPOFOL N/A 07/01/2017   Procedure: ESOPHAGOGASTRODUODENOSCOPY (EGD) WITH PROPOFOL;  Surgeon: Jonathon Bellows, MD;  Location: [facility] ENDOSCOPY;  Service: Gastroenterology;  Laterality: N/A;  . FLEXIBLE SIGMOIDOSCOPY N/A 07/01/2017   Procedure: FLEXIBLE SIGMOIDOSCOPY;  Surgeon: Jonathon Bellows, MD;  Location: [facility] ENDOSCOPY;  Service: Gastroenterology;  Laterality: N/A;  . KNEE SURGERY    . mini gastric bypass    . SHOULDER SURGERY    . TONSILLECTOMY     Social History   Social History Narrative  . Not on file    There is no immunization history on file for this patient.   Objective: Vital Signs: There were no vitals taken for this visit.   Physical Exam   Musculoskeletal Exam: ***  CDAI Exam: CDAI Score: -- Patient Global: --; Provider Global: -- Swollen: --; Tender: -- Joint Exam 10/31/2019   No joint exam has been documented for this visit   There is currently no information documented on the homunculus. Go to the Rheumatology activity and complete the homunculus joint exam.  Investigation: No additional findings.  Imaging: No results found.  Recent  Labs: Lab Results  Component Value Date   WBC 4.5 09/05/2019   HGB 13.0 09/05/2019   PLT 176 09/05/2019   NA 140 09/05/2019   K 3.8 09/05/2019   CL 104 09/05/2019   CO2 28 09/05/2019   GLUCOSE 98 09/05/2019   BUN 10 09/05/2019   CREATININE 0.50 09/05/2019   BILITOT 0.8 09/05/2019   ALKPHOS 44 09/05/2019   AST 58 (H) 09/05/2019   ALT 41 09/05/2019   PROT 6.0 (L) 09/05/2019   ALBUMIN 3.7 09/05/2019   CALCIUM 8.0 (L) 09/05/2019   GFRAA >60 09/05/2019    Speciality Comments: Osteoporosis followed by her endocrinologist Dr. Buddy Duty.  Procedures:  No procedures performed Allergies: Lac bovis, Prednisone, Sulfa antibiotics, Tape, and Zithromax [azithromycin]   Assessment / Plan:     Visit Diagnoses: No diagnosis found.  Orders: No orders of the defined types were placed in this encounter.  No orders of the defined types were placed in this encounter.   Face-to-face time spent with patient was *** minutes. Greater than 50% of time was spent in counseling and coordination of care.  Follow-Up Instructions: No follow-ups on file.   Ofilia Neas, PA-C  Note - This record has been created using Dragon software.  Chart creation errors have been sought, but may not always  have been located. Such creation errors do not reflect on  the standard of medical care.

## 2019-10-30 DIAGNOSIS — M810 Age-related osteoporosis without current pathological fracture: Secondary | ICD-10-CM | POA: Insufficient documentation

## 2019-10-31 ENCOUNTER — Ambulatory Visit: Payer: Medicare Other | Admitting: Physician Assistant

## 2019-11-01 ENCOUNTER — Ambulatory Visit (INDEPENDENT_AMBULATORY_CARE_PROVIDER_SITE_OTHER): Payer: Medicare Other | Admitting: Rheumatology

## 2019-11-01 ENCOUNTER — Encounter: Payer: Self-pay | Admitting: Rheumatology

## 2019-11-01 ENCOUNTER — Ambulatory Visit: Payer: Self-pay

## 2019-11-01 ENCOUNTER — Other Ambulatory Visit: Payer: Self-pay

## 2019-11-01 VITALS — BP 113/72 | HR 70 | Resp 15 | Ht 63.0 in | Wt 190.0 lb

## 2019-11-01 DIAGNOSIS — M19041 Primary osteoarthritis, right hand: Secondary | ICD-10-CM

## 2019-11-01 DIAGNOSIS — K219 Gastro-esophageal reflux disease without esophagitis: Secondary | ICD-10-CM

## 2019-11-01 DIAGNOSIS — E538 Deficiency of other specified B group vitamins: Secondary | ICD-10-CM

## 2019-11-01 DIAGNOSIS — Z9884 Bariatric surgery status: Secondary | ICD-10-CM

## 2019-11-01 DIAGNOSIS — Z8739 Personal history of other diseases of the musculoskeletal system and connective tissue: Secondary | ICD-10-CM

## 2019-11-01 DIAGNOSIS — M5136 Other intervertebral disc degeneration, lumbar region: Secondary | ICD-10-CM

## 2019-11-01 DIAGNOSIS — N2581 Secondary hyperparathyroidism of renal origin: Secondary | ICD-10-CM

## 2019-11-01 DIAGNOSIS — M720 Palmar fascial fibromatosis [Dupuytren]: Secondary | ICD-10-CM

## 2019-11-01 DIAGNOSIS — M19042 Primary osteoarthritis, left hand: Secondary | ICD-10-CM

## 2019-11-01 DIAGNOSIS — D500 Iron deficiency anemia secondary to blood loss (chronic): Secondary | ICD-10-CM

## 2019-11-01 DIAGNOSIS — M79641 Pain in right hand: Secondary | ICD-10-CM | POA: Diagnosis not present

## 2019-11-01 DIAGNOSIS — E559 Vitamin D deficiency, unspecified: Secondary | ICD-10-CM

## 2019-11-01 DIAGNOSIS — M79642 Pain in left hand: Secondary | ICD-10-CM

## 2019-11-01 DIAGNOSIS — Z8709 Personal history of other diseases of the respiratory system: Secondary | ICD-10-CM

## 2019-11-01 DIAGNOSIS — M1811 Unilateral primary osteoarthritis of first carpometacarpal joint, right hand: Secondary | ICD-10-CM

## 2019-11-01 DIAGNOSIS — Z8781 Personal history of (healed) traumatic fracture: Secondary | ICD-10-CM

## 2019-11-01 DIAGNOSIS — M19012 Primary osteoarthritis, left shoulder: Secondary | ICD-10-CM

## 2019-11-01 MED ORDER — LIDOCAINE HCL 1 % IJ SOLN
0.5000 mL | INTRAMUSCULAR | Status: AC | PRN
  Administered 2019-11-01: .5 mL

## 2019-11-01 MED ORDER — TRIAMCINOLONE ACETONIDE 40 MG/ML IJ SUSP
10.0000 mg | INTRAMUSCULAR | Status: AC | PRN
  Administered 2019-11-01: 10 mg via INTRA_ARTICULAR

## 2019-11-01 NOTE — Progress Notes (Signed)
Office Visit Note  Patient: [name]             Date of Birth: 08/13/1951           MRN: [id number]             PCP: Mar Daring, PA-C Referring: Florian Buff* Visit Date: 11/01/2019 Occupation: @GUAROCC @  Subjective:  Pain in both hands.   History of Present Illness: [name] is a 69 y.o. female with a past medical history of osteoarthritis. She endorses persistent worsening pain of her right CMC joint. She states that the pain has gotten progressively worse since her last visit and she is unable to perform daily activities such as brushing her hair or teeth due to the pain. The pain is worse at night and with any movement of her right thumb. She currently wears a brace on her right hand that seems to help alleviate some of the pain. She also endorses swelling of her right CMC joint. She states that she has limited mobility of her left hand due to fracturing her wrist on two different occasions. She notes increased back pain since her last visit and states that she has been more sedentary in the last couple of months. She also continues to have chronic bilateral knee pain. She denies swelling or inflammation of any other joints. She continues to receive Prolia injections and regular vitamin D supplementation.   Activities of Daily Living:  Patient reports morning stiffness for several hours.   Patient Reports nocturnal pain.  Difficulty dressing/grooming: Reports Difficulty climbing stairs: Denies Difficulty getting out of chair: Denies Difficulty using hands for taps, buttons, cutlery, and/or writing: Reports  Review of Systems  Constitutional: Positive for fatigue. Negative for night sweats, weight gain and weight loss.  HENT: Negative for mouth sores, trouble swallowing, trouble swallowing, mouth dryness and nose dryness.   Eyes: Negative for pain, redness, itching, visual disturbance and dryness.  Respiratory: Negative for cough, shortness  of breath and difficulty breathing.   Cardiovascular: Negative for chest pain, palpitations, hypertension, irregular heartbeat and swelling in legs/feet.  Gastrointestinal: Negative for blood in stool, constipation and diarrhea.  Endocrine: Negative for increased urination.  Genitourinary: Negative for difficulty urinating, painful urination and vaginal dryness.  Musculoskeletal: Positive for arthralgias, joint pain and morning stiffness. Negative for joint swelling, myalgias, muscle weakness, muscle tenderness and myalgias.  Skin: Negative for color change, rash, hair loss, skin tightness, ulcers and sensitivity to sunlight.  Allergic/Immunologic: Negative for susceptible to infections.  Neurological: Negative for dizziness, light-headedness, numbness, headaches, memory loss, night sweats and weakness.  Hematological: Negative for bruising/bleeding tendency and swollen glands.  Psychiatric/Behavioral: Positive for sleep disturbance. Negative for depressed mood and confusion. The patient is not nervous/anxious.     PMFS History:  Patient Active Problem List   Diagnosis Date Noted  . Age-related osteoporosis without current pathological fracture 10/30/2019  . Left-sided weakness   . Stroke-like symptoms 02/24/2018  . HTN (hypertension) 02/24/2018  . Primary osteoarthritis of both hands 11/03/2017  . Dupuytren's contracture of left hand 11/03/2017  . DDD (degenerative disc disease), lumbar 11/03/2017  . History of gastric bypass 11/03/2017  . Lymphedema 09/17/2017  . History of vertebral fracture 09/17/2017  . History of osteoporosis 09/17/2017  . Iron deficiency anemia due to chronic blood loss 06/28/2017  . Lower GI bleed 05/31/2017  . Rectal mass 05/31/2017  . Asthma 05/31/2017  . GERD (gastroesophageal reflux disease) 05/31/2017  . Closed fracture of  upper end of humerus 09/23/2016  . Tendinitis 09/23/2016  . Patulous eustachian tube of right ear 11/07/2015  . Superior  semicircular canal dehiscence, bilateral 11/07/2015  . Vitamin D deficiency 08/20/2014  . Secondary hyperparathyroidism ([place]) 08/20/2014    Past Medical History:  Diagnosis Date  . Asthma   . GERD (gastroesophageal reflux disease)   . Internal hemorrhoids   . Iron deficiency anemia   . Osteoporosis   . Peptic ulcer   . Tubular adenoma of colon     Family History  Problem Relation Age of Onset  . Hypertension Other   . Stroke Other   . Diabetes Other   . Heart attack Other   . Obesity Other   . Diabetes Father   . Heart disease Father   . Colon cancer Father   . Breast cancer Paternal Grandmother   . Healthy Daughter   . Stomach cancer Neg Hx   . Pancreatic cancer Neg Hx    Past Surgical History:  Procedure Laterality Date  . BACK SURGERY    . BREAST SURGERY    . CHOLECYSTECTOMY    . COLONOSCOPY WITH PROPOFOL N/A 06/02/2017   Procedure: COLONOSCOPY WITH PROPOFOL;  Surgeon: Jonathon Bellows, MD;  Location: [facility] ENDOSCOPY;  Service: Gastroenterology;  Laterality: N/A;  . ESOPHAGOGASTRODUODENOSCOPY (EGD) WITH PROPOFOL N/A 07/01/2017   Procedure: ESOPHAGOGASTRODUODENOSCOPY (EGD) WITH PROPOFOL;  Surgeon: Jonathon Bellows, MD;  Location: [facility] ENDOSCOPY;  Service: Gastroenterology;  Laterality: N/A;  . FLEXIBLE SIGMOIDOSCOPY N/A 07/01/2017   Procedure: FLEXIBLE SIGMOIDOSCOPY;  Surgeon: Jonathon Bellows, MD;  Location: [facility] ENDOSCOPY;  Service: Gastroenterology;  Laterality: N/A;  . KNEE SURGERY    . mini gastric bypass    . SHOULDER SURGERY    . TONSILLECTOMY     Social History   Social History Narrative  . Not on file    There is no immunization history on file for this patient.   Objective: Vital Signs: BP 113/72 (BP Location: Left Arm, Patient Position: Sitting, Cuff Size: Normal)   Pulse 70   Resp 15   Ht 5\' 3"  (1.6 m)   Wt 190 lb (86.2 kg)   BMI 33.66 kg/m    Physical Exam Vitals and nursing note reviewed.  Constitutional:      Appearance: She is well-developed.  HENT:       Head: Normocephalic and atraumatic.  Eyes:     Conjunctiva/sclera: Conjunctivae normal.  Cardiovascular:     Rate and Rhythm: Normal rate and regular rhythm.     Heart sounds: Normal heart sounds.  Pulmonary:     Effort: Pulmonary effort is normal.     Breath sounds: Normal breath sounds.  Abdominal:     General: Bowel sounds are normal.     Palpations: Abdomen is soft.  Musculoskeletal:     Cervical back: Normal range of motion.  Lymphadenopathy:     Cervical: No cervical adenopathy.  Skin:    General: Skin is warm and dry.     Capillary Refill: Capillary refill takes less than 2 seconds.  Neurological:     Mental Status: She is alert and oriented to person, place, and time.  Psychiatric:        Behavior: Behavior normal.      Musculoskeletal Exam: C-spine was in good range of motion.  She has some discomfort range of motion of her left shoulder joint.  Elbow joints with good range of motion.  She has bilateral CMC PIP and DIP thickening consistent with osteoarthritis.  Hip  joints and knee joints in good range of motion.  No synovitis was noted.  CDAI Exam: CDAI Score: -- Patient Global: --; Provider Global: -- Swollen: --; Tender: -- Joint Exam 11/01/2019   No joint exam has been documented for this visit   There is currently no information documented on the homunculus. Go to the Rheumatology activity and complete the homunculus joint exam.  Investigation: No additional findings.  Imaging: XR Hand 2 View Left  Result Date: 11/01/2019 CMC narrowing was noted.  PIP and DIP narrowing was noted.  No MCP, intercarpal radiocarpal joint space narrowing was noted.  No erosive changes were noted. Impression: These findings are consistent with osteoarthritis of the hand.  XR Hand 2 View Right  Result Date: 11/01/2019 Severe CMC narrowing and subluxation was noted.  PIP and DIP narrowing was noted.  No MCP, intercarpal or radiocarpal joint space narrowing was noted.  Impression: These findings are consistent with severe osteoarthritis of the hand.   Recent Labs: Lab Results  Component Value Date   WBC 4.5 09/05/2019   HGB 13.0 09/05/2019   PLT 176 09/05/2019   NA 140 09/05/2019   K 3.8 09/05/2019   CL 104 09/05/2019   CO2 28 09/05/2019   GLUCOSE 98 09/05/2019   BUN 10 09/05/2019   CREATININE 0.50 09/05/2019   BILITOT 0.8 09/05/2019   ALKPHOS 44 09/05/2019   AST 58 (H) 09/05/2019   ALT 41 09/05/2019   PROT 6.0 (L) 09/05/2019   ALBUMIN 3.7 09/05/2019   CALCIUM 8.0 (L) 09/05/2019   GFRAA >60 09/05/2019    Speciality Comments: Osteoporosis followed by her endocrinologist Dr. Buddy Duty.  Procedures:  Small Joint Inj: R thumb CMC on 11/01/2019 12:39 PM Indications: pain Details: 27 G needle, ultrasound-guided radial approach  Spinal Needle: No  Medications: 0.5 mL lidocaine 1 %; 10 mg triamcinolone acetonide 40 MG/ML Aspirate: 0 mL Outcome: tolerated well, no immediate complications Procedure, treatment alternatives, risks and benefits explained, specific risks discussed. Consent was given by the patient. Immediately prior to procedure a time out was called to verify the correct patient, procedure, equipment, support staff and site/side marked as required. Patient was prepped and draped in the usual sterile fashion.     Allergies: Lac bovis, Prednisone, Sulfa antibiotics, Tape, and Zithromax [azithromycin]   Assessment / Plan:     Visit Diagnoses: Arthritis of carpometacarpal (CMC) joint of right thumb -patient is having severe pain and discomfort in her right CMC joint.  We obtained x-rays of bilateral hands and reviewed.  She has right CMC narrowing and subluxation.  She has had 2 cortisone injections in the past which fairly good results.  Today after informed consent was obtained right CMC joint was injected with cortisone.  Patient tolerated the procedure well.  Postprocedure instructions were given.  I also discussed possible surgery for  her right CMC joint.  She is wanting to explore that further.  I will refer her to orthopedics.  Plan: Ambulatory referral to Orthopedic Surgery  Primary osteoarthritis of both hands-she has severe osteoarthritis in her bilateral hands.  Joint protection and muscle strengthening was discussed.  Bilateral hand pain - Plan: XR Hand 2 View Right, XR Hand 2 View Left.  X-ray findings were discussed with the patient.  Dupuytren's contracture of left hand   Primary osteoarthritis of left shoulder-she has some discomfort range of motion.  DDD (degenerative disc disease), lumbar-she has chronic pain.  History of osteoporosis - She is receiving prolia 60 mg sq injections every  6 months. She was previously on Forteo.  She is followed by her endocrinologist Dr. Buddy Duty.   History of vertebral fracture  Vitamin D deficiency-she is on vitamin D supplement.  Secondary hyperparathyroidism ([place])  History of gastric bypass - 2003.   History of asthma  B12 deficiency  Iron deficiency anemia due to chronic blood loss  Gastroesophageal reflux disease, unspecified whether esophagitis present  Orders: Orders Placed This Encounter  Procedures  . Small Joint Inj  . XR Hand 2 View Right  . XR Hand 2 View Left  . Ambulatory referral to Orthopedic Surgery   No orders of the defined types were placed in this encounter.    Follow-Up Instructions: Return in about 6 months (around 04/30/2020) for Osteoarthritis.   Bo Merino, MD  Note - This record has been created using Editor, commissioning.  Chart creation errors have been sought, but may not always  have been located. Such creation errors do not reflect on  the standard of medical care.

## 2019-11-01 NOTE — Patient Instructions (Signed)
Hand Exercises Hand exercises can be helpful for almost anyone. These exercises can strengthen the hands, improve flexibility and movement, and increase blood flow to the hands. These results can make work and daily tasks easier. Hand exercises can be especially helpful for people who have joint pain from arthritis or have nerve damage from overuse (carpal tunnel syndrome). These exercises can also help people who have injured a hand. Exercises Most of these hand exercises are gentle stretching and motion exercises. It is usually safe to do them often throughout the day. Warming up your hands before exercise may help to reduce stiffness. You can do this with gentle massage or by placing your hands in warm water for 10-15 minutes. It is normal to feel some stretching, pulling, tightness, or mild discomfort as you begin new exercises. This will gradually improve. Stop an exercise right away if you feel sudden, severe pain or your pain gets worse. Ask your health care provider which exercises are best for you. Knuckle bend or "claw" fist 1. Stand or sit with your arm, hand, and all five fingers pointed straight up. Make sure to keep your wrist straight during the exercise. 2. Gently bend your fingers down toward your palm until the tips of your fingers are touching the top of your palm. Keep your big knuckle straight and just bend the small knuckles in your fingers. 3. Hold this position for __________ seconds. 4. Straighten (extend) your fingers back to the starting position. Repeat this exercise 5-10 times with each hand. Full finger fist 1. Stand or sit with your arm, hand, and all five fingers pointed straight up. Make sure to keep your wrist straight during the exercise. 2. Gently bend your fingers into your palm until the tips of your fingers are touching the middle of your palm. 3. Hold this position for __________ seconds. 4. Extend your fingers back to the starting position, stretching every  joint fully. Repeat this exercise 5-10 times with each hand. Straight fist 1. Stand or sit with your arm, hand, and all five fingers pointed straight up. Make sure to keep your wrist straight during the exercise. 2. Gently bend your fingers at the big knuckle, where your fingers meet your hand, and the middle knuckle. Keep the knuckle at the tips of your fingers straight and try to touch the bottom of your palm. 3. Hold this position for __________ seconds. 4. Extend your fingers back to the starting position, stretching every joint fully. Repeat this exercise 5-10 times with each hand. Tabletop 1. Stand or sit with your arm, hand, and all five fingers pointed straight up. Make sure to keep your wrist straight during the exercise. 2. Gently bend your fingers at the big knuckle, where your fingers meet your hand, as far down as you can while keeping the small knuckles in your fingers straight. Think of forming a tabletop with your fingers. 3. Hold this position for __________ seconds. 4. Extend your fingers back to the starting position, stretching every joint fully. Repeat this exercise 5-10 times with each hand. Finger spread 1. Place your hand flat on a table with your palm facing down. Make sure your wrist stays straight as you do this exercise. 2. Spread your fingers and thumb apart from each other as far as you can until you feel a gentle stretch. Hold this position for __________ seconds. 3. Bring your fingers and thumb tight together again. Hold this position for __________ seconds. Repeat this exercise 5-10 times with each hand.   Making circles 1. Stand or sit with your arm, hand, and all five fingers pointed straight up. Make sure to keep your wrist straight during the exercise. 2. Make a circle by touching the tip of your thumb to the tip of your index finger. 3. Hold for __________ seconds. Then open your hand wide. 4. Repeat this motion with your thumb and each finger on your  hand. Repeat this exercise 5-10 times with each hand. Thumb motion 1. Sit with your forearm resting on a table and your wrist straight. Your thumb should be facing up toward the ceiling. Keep your fingers relaxed as you move your thumb. 2. Lift your thumb up as high as you can toward the ceiling. Hold for __________ seconds. 3. Bend your thumb across your palm as far as you can, reaching the tip of your thumb for the small finger (pinkie) side of your palm. Hold for __________ seconds. Repeat this exercise 5-10 times with each hand. Grip strengthening  1. Hold a stress ball or other soft ball in the middle of your hand. 2. Slowly increase the pressure, squeezing the ball as much as you can without causing pain. Think of bringing the tips of your fingers into the middle of your palm. All of your finger joints should bend when doing this exercise. 3. Hold your squeeze for __________ seconds, then relax. Repeat this exercise 5-10 times with each hand. Contact a health care provider if:  Your hand pain or discomfort gets much worse when you do an exercise.  Your hand pain or discomfort does not improve within 2 hours after you exercise. If you have any of these problems, stop doing these exercises right away. Do not do them again unless your health care provider says that you can. Get help right away if:  You develop sudden, severe hand pain or swelling. If this happens, stop doing these exercises right away. Do not do them again unless your health care provider says that you can. This information is not intended to replace advice given to you by your health care provider. Make sure you discuss any questions you have with your health care provider. Document Revised: 12/22/2018 Document Reviewed: 09/01/2018 Elsevier Patient Education  2020 [employer]  

## 2019-11-02 ENCOUNTER — Ambulatory Visit: Payer: Medicare Other | Admitting: Rheumatology

## 2019-11-15 DIAGNOSIS — M19041 Primary osteoarthritis, right hand: Secondary | ICD-10-CM | POA: Insufficient documentation

## 2019-12-25 ENCOUNTER — Ambulatory Visit: Payer: Medicare Other | Attending: Internal Medicine

## 2019-12-25 ENCOUNTER — Other Ambulatory Visit: Payer: Self-pay

## 2019-12-25 DIAGNOSIS — Z20822 Contact with and (suspected) exposure to covid-19: Secondary | ICD-10-CM

## 2019-12-26 LAB — SARS-COV-2, NAA 2 DAY TAT

## 2019-12-26 LAB — NOVEL CORONAVIRUS, NAA: SARS-CoV-2, NAA: DETECTED — AB

## 2019-12-27 ENCOUNTER — Other Ambulatory Visit: Payer: Self-pay | Admitting: Adult Health

## 2019-12-27 ENCOUNTER — Ambulatory Visit (HOSPITAL_COMMUNITY)
Admission: RE | Admit: 2019-12-27 | Discharge: 2019-12-27 | Disposition: A | Discharge status: Home / Self Care | Payer: Medicare Other | Source: Ambulatory Visit | Attending: Pulmonary Disease | Admitting: Pulmonary Disease

## 2019-12-27 DIAGNOSIS — U071 COVID-19: Secondary | ICD-10-CM | POA: Diagnosis present

## 2019-12-27 DIAGNOSIS — Z23 Encounter for immunization: Secondary | ICD-10-CM | POA: Diagnosis not present

## 2019-12-27 MED ORDER — SODIUM CHLORIDE 0.9 % IV SOLN
Freq: Once | INTRAVENOUS | Status: DC
  Filled 2019-12-27: qty 20

## 2019-12-27 NOTE — Progress Notes (Signed)
  Diagnosis: COVID-19  Physician: Dr. Joya Gaskins  Procedure: [facility] Med: bamlanivimab\etesevimab infusion - Provided patient with bamlanimivab\etesevimab fact sheet for patients, parents and caregivers prior to infusion.  Complications: No immediate complications noted.  Discharge: Discharged home   [name], [name] 12/27/2019

## 2019-12-27 NOTE — Progress Notes (Signed)
  I connected by phone with [name] on 12/27/2019 at 8:54 AM to discuss the potential use of an new treatment for mild to moderate COVID-19 viral infection in non-hospitalized patients.  This patient is a 69 y.o. female that meets the FDA criteria for Emergency Use Authorization of bamlanivimab/etesevimab or casirivimab/imdevimab.  Has a (+) direct SARS-CoV-2 viral test result  Has mild or moderate COVID-19   Is ? 69 years of age and weighs ? 40 kg  Is NOT hospitalized due to COVID-19  Is NOT requiring oxygen therapy or requiring an increase in baseline oxygen flow rate due to COVID-19  Is within 10 days of symptom onset  Has at least one of the high risk factor(s) for progression to severe COVID-19 and/or hospitalization as defined in EUA.  Specific high risk criteria : >/= 67 yo   I have spoken and communicated the following to the patient or parent/caregiver:  1. FDA has authorized the emergency use of bamlanivimab/etesevimab and casirivimab\imdevimab for the treatment of mild to moderate COVID-19 in adults and pediatric patients with positive results of direct SARS-CoV-2 viral testing who are 102 years of age and older weighing at least 40 kg, and who are at high risk for progressing to severe COVID-19 and/or hospitalization.  2. The significant known and potential risks and benefits of bamlanivimab/etesevimab and casirivimab\imdevimab, and the extent to which such potential risks and benefits are unknown.  3. Information on available alternative treatments and the risks and benefits of those alternatives, including clinical trials.  4. Patients treated with bamlanivimab/etesevimab and casirivimab\imdevimab should continue to self-isolate and use infection control measures (e.g., wear mask, isolate, social distance, avoid sharing personal items, clean and disinfect "high touch" surfaces, and frequent handwashing) according to CDC guidelines.   5. The patient or  parent/caregiver has the option to accept or refuse bamlanivimab/etesevimab or casirivimab\imdevimab .  After reviewing this information with the patient, The patient agreed to proceed with receiving the bamlanimivab infusion and will be provided a copy of the Fact sheet prior to receiving the infusion.  Scheduled 12/27/19 at 1230  . Dimitrious Micciche 12/27/2019 8:54 AM

## 2020-03-19 ENCOUNTER — Other Ambulatory Visit (HOSPITAL_COMMUNITY): Payer: Medicare Other

## 2020-03-26 ENCOUNTER — Ambulatory Visit (HOSPITAL_COMMUNITY): Payer: Medicare Other | Admitting: Hematology

## 2020-04-24 NOTE — Progress Notes (Deleted)
Office Visit Note  Patient: [name]             Date of Birth: 1951/08/19           MRN: [id number]             PCP: Mar Daring, PA-C Referring: Florian Buff* Visit Date: 05/07/2020 Occupation: @GUAROCC @  Subjective:  No chief complaint on file.   History of Present Illness: [name] is a 69 y.o. female ***   Activities of Daily Living:  Patient reports morning stiffness for *** {minute/hour:19697}.   Patient {ACTIONS;DENIES/REPORTS:21021675::"Denies"} nocturnal pain.  Difficulty dressing/grooming: {ACTIONS;DENIES/REPORTS:21021675::"Denies"} Difficulty climbing stairs: {ACTIONS;DENIES/REPORTS:21021675::"Denies"} Difficulty getting out of chair: {ACTIONS;DENIES/REPORTS:21021675::"Denies"} Difficulty using hands for taps, buttons, cutlery, and/or writing: {ACTIONS;DENIES/REPORTS:21021675::"Denies"}  No Rheumatology ROS completed.   PMFS History:  Patient Active Problem List   Diagnosis Date Noted  . Age-related osteoporosis without current pathological fracture 10/30/2019  . Left-sided weakness   . Stroke-like symptoms 02/24/2018  . HTN (hypertension) 02/24/2018  . Primary osteoarthritis of both hands 11/03/2017  . Dupuytren's contracture of left hand 11/03/2017  . DDD (degenerative disc disease), lumbar 11/03/2017  . History of gastric bypass 11/03/2017  . Lymphedema 09/17/2017  . History of vertebral fracture 09/17/2017  . History of osteoporosis 09/17/2017  . Iron deficiency anemia due to chronic blood loss 06/28/2017  . Lower GI bleed 05/31/2017  . Rectal mass 05/31/2017  . Asthma 05/31/2017  . GERD (gastroesophageal reflux disease) 05/31/2017  . Closed fracture of upper end of humerus 09/23/2016  . Tendinitis 09/23/2016  . Patulous eustachian tube of right ear 11/07/2015  . 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 Vitamin D deficiency 08/20/2014  . Secondary hyperparathyroidism ([place]) 08/20/2014      Past Medical History:  Diagnosis Date  . Asthma   . GERD (gastroesophageal reflux disease)   . Internal hemorrhoids   . Iron deficiency anemia   . Osteoporosis   . Peptic ulcer   . Tubular adenoma of colon     Family History  Problem Relation Age of Onset  . Hypertension Other   . Stroke Other   . Diabetes Other   . Heart attack Other   . Obesity Other   . Diabetes Father   . Heart disease Father   . Colon cancer Father   . Breast cancer Paternal Grandmother   . Healthy Daughter   . Stomach cancer Neg Hx   . Pancreatic cancer Neg Hx    Past Surgical History:  Procedure Laterality Date  . BACK SURGERY    . BREAST SURGERY    . CHOLECYSTECTOMY    . COLONOSCOPY WITH PROPOFOL N/A 06/02/2017   Procedure: COLONOSCOPY WITH PROPOFOL;  Surgeon: Jonathon Bellows, MD;  Location: [facility] ENDOSCOPY;  Service: Gastroenterology;  Laterality: N/A;  . ESOPHAGOGASTRODUODENOSCOPY (EGD) WITH PROPOFOL N/A 07/01/2017   Procedure: ESOPHAGOGASTRODUODENOSCOPY (EGD) WITH PROPOFOL;  Surgeon: Jonathon Bellows, MD;  Location: [facility] ENDOSCOPY;  Service: Gastroenterology;  Laterality: N/A;  . FLEXIBLE SIGMOIDOSCOPY N/A 07/01/2017   Procedure: FLEXIBLE SIGMOIDOSCOPY;  Surgeon: Jonathon Bellows, MD;  Location: [facility] ENDOSCOPY;  Service: Gastroenterology;  Laterality: N/A;  . KNEE SURGERY    . mini gastric bypass    . SHOULDER SURGERY    . TONSILLECTOMY     Social History   Social History Narrative  . Not on file    There is no immunization history on file for this patient.   Objective: Vital Signs: There were no vitals taken for this  visit.   Physical Exam   Musculoskeletal Exam: ***  CDAI Exam: CDAI Score: -- Patient Global: --; Provider Global: -- Swollen: --; Tender: -- Joint Exam 05/07/2020   No joint exam has been documented for this visit   There is currently no information documented on the homunculus. Go to the Rheumatology activity and complete the homunculus joint exam.  Investigation: No  additional findings.  Imaging: No results found.  Recent Labs: Lab Results  Component Value Date   WBC 4.5 09/05/2019   HGB 13.0 09/05/2019   PLT 176 09/05/2019   NA 140 09/05/2019   K 3.8 09/05/2019   CL 104 09/05/2019   CO2 28 09/05/2019   GLUCOSE 98 09/05/2019   BUN 10 09/05/2019   CREATININE 0.50 09/05/2019   BILITOT 0.8 09/05/2019   ALKPHOS 44 09/05/2019   AST 58 (H) 09/05/2019   ALT 41 09/05/2019   PROT 6.0 (L) 09/05/2019   ALBUMIN 3.7 09/05/2019   CALCIUM 8.0 (L) 09/05/2019   GFRAA >60 09/05/2019    Speciality Comments: Osteoporosis followed by her endocrinologist Dr. Buddy Duty.  Procedures:  No procedures performed Allergies: Lac bovis, Prednisone, Sulfa antibiotics, Tape, and Zithromax [azithromycin]   Assessment / Plan:     Visit Diagnoses: No diagnosis found.  Orders: No orders of the defined types were placed in this encounter.  No orders of the defined types were placed in this encounter.   Face-to-face time spent with patient was *** minutes. Greater than 50% of time was spent in counseling and coordination of care.  Follow-Up Instructions: No follow-ups on file.   Earnestine Mealing, CMA  Note - This record has been created using Editor, commissioning.  Chart creation errors have been sought, but may not always  have been located. Such creation errors do not reflect on  the standard of medical care.

## 2020-05-02 ENCOUNTER — Inpatient Hospital Stay (HOSPITAL_COMMUNITY): Payer: Medicare Other | Attending: Hematology

## 2020-05-02 DIAGNOSIS — D509 Iron deficiency anemia, unspecified: Secondary | ICD-10-CM | POA: Insufficient documentation

## 2020-05-02 DIAGNOSIS — K909 Intestinal malabsorption, unspecified: Secondary | ICD-10-CM | POA: Insufficient documentation

## 2020-05-02 DIAGNOSIS — M818 Other osteoporosis without current pathological fracture: Secondary | ICD-10-CM | POA: Diagnosis not present

## 2020-05-02 DIAGNOSIS — Z9884 Bariatric surgery status: Secondary | ICD-10-CM | POA: Insufficient documentation

## 2020-05-02 DIAGNOSIS — D500 Iron deficiency anemia secondary to blood loss (chronic): Secondary | ICD-10-CM

## 2020-05-02 LAB — CBC WITH DIFFERENTIAL/PLATELET
Abs Immature Granulocytes: 0.01 10*3/uL (ref 0.00–0.07)
Basophils Absolute: 0 10*3/uL (ref 0.0–0.1)
Basophils Relative: 0 %
Eosinophils Absolute: 0.1 10*3/uL (ref 0.0–0.5)
Eosinophils Relative: 2 %
HCT: 42.3 % (ref 36.0–46.0)
Hemoglobin: 13.3 g/dL (ref 12.0–15.0)
Immature Granulocytes: 0 %
Lymphocytes Relative: 20 %
Lymphs Abs: 1.2 10*3/uL (ref 0.7–4.0)
MCH: 31.4 pg (ref 26.0–34.0)
MCHC: 31.4 g/dL (ref 30.0–36.0)
MCV: 99.8 fL (ref 80.0–100.0)
Monocytes Absolute: 0.4 10*3/uL (ref 0.1–1.0)
Monocytes Relative: 7 %
Neutro Abs: 4.3 10*3/uL (ref 1.7–7.7)
Neutrophils Relative %: 71 %
Platelets: 185 10*3/uL (ref 150–400)
RBC: 4.24 MIL/uL (ref 3.87–5.11)
RDW: 12.7 % (ref 11.5–15.5)
WBC: 6.1 10*3/uL (ref 4.0–10.5)
nRBC: 0 % (ref 0.0–0.2)

## 2020-05-02 LAB — IRON AND TIBC
Iron: 79 ug/dL (ref 28–170)
Saturation Ratios: 26 % (ref 10.4–31.8)
TIBC: 307 ug/dL (ref 250–450)
UIBC: 228 ug/dL

## 2020-05-02 LAB — FERRITIN: Ferritin: 124 ng/mL (ref 11–307)

## 2020-05-02 LAB — COMPREHENSIVE METABOLIC PANEL
ALT: 26 U/L (ref 0–44)
AST: 28 U/L (ref 15–41)
Albumin: 3.4 g/dL — ABNORMAL LOW (ref 3.5–5.0)
Alkaline Phosphatase: 46 U/L (ref 38–126)
Anion gap: 6 (ref 5–15)
BUN: 10 mg/dL (ref 8–23)
CO2: 26 mmol/L (ref 22–32)
Calcium: 8.5 mg/dL — ABNORMAL LOW (ref 8.9–10.3)
Chloride: 110 mmol/L (ref 98–111)
Creatinine, Ser: 0.58 mg/dL (ref 0.44–1.00)
GFR calc Af Amer: >60 mL/min (ref >60)
GFR calc non Af Amer: >60 mL/min (ref >60)
Glucose, Bld: 101 mg/dL — ABNORMAL HIGH (ref 70–99)
Potassium: 4.4 mmol/L (ref 3.5–5.1)
Sodium: 142 mmol/L (ref 135–145)
Total Bilirubin: 0.6 mg/dL (ref 0.3–1.2)
Total Protein: 6.1 g/dL — ABNORMAL LOW (ref 6.5–8.1)

## 2020-05-02 LAB — VITAMIN B12: Vitamin B-12: 531 pg/mL (ref 180–914)

## 2020-05-02 LAB — FOLATE: Folate: 29.7 ng/mL (ref >5.9)

## 2020-05-07 ENCOUNTER — Ambulatory Visit: Payer: Medicare Other | Admitting: Rheumatology

## 2020-05-09 ENCOUNTER — Inpatient Hospital Stay (HOSPITAL_BASED_OUTPATIENT_CLINIC_OR_DEPARTMENT_OTHER): Payer: Medicare Other | Admitting: Hematology

## 2020-05-09 ENCOUNTER — Other Ambulatory Visit: Payer: Self-pay

## 2020-05-09 ENCOUNTER — Encounter (HOSPITAL_COMMUNITY): Payer: Self-pay | Admitting: Hematology

## 2020-05-09 VITALS — BP 123/72 | HR 71 | Temp 97.3°F | Resp 18 | Wt 191.0 lb

## 2020-05-09 DIAGNOSIS — D500 Iron deficiency anemia secondary to blood loss (chronic): Secondary | ICD-10-CM | POA: Diagnosis not present

## 2020-05-09 DIAGNOSIS — D509 Iron deficiency anemia, unspecified: Secondary | ICD-10-CM | POA: Diagnosis not present

## 2020-05-09 NOTE — Progress Notes (Signed)
[address] [place], [place] [address]   CLINIC:  Medical Oncology/Hematology  PCP:  [place] [place] / [place] [address]  [phone]  REASON FOR VISIT:  Follow-up for iron deficiency anemia  PRIOR THERAPY: None  CURRENT THERAPY: Intermittent Feraheme  INTERVAL HISTORY:  [name], a 69 y.o. female, returns for routine follow-up for her iron deficiency anemia. [name] was last contacted via phone on 09/19/2019.  Today she reports feeling well. Her energy levels are good, though she still gets intermittent fatigue. She denies having melena or hematochezia. She denies having F/C, night sweats, or weight loss recently.   REVIEW OF SYSTEMS:  Review of Systems  Constitutional: Positive for fatigue (mild). Negative for appetite change, chills, diaphoresis, fever and unexpected weight change.  Gastrointestinal: Negative for blood in stool.  All other systems reviewed and are negative.   PAST MEDICAL/SURGICAL HISTORY:  Past Medical History:  Diagnosis Date  . Asthma   . GERD (gastroesophageal reflux disease)   . Internal hemorrhoids   . Iron deficiency anemia   . Osteoporosis   . Peptic ulcer   . Tubular adenoma of colon    Past Surgical History:  Procedure Laterality Date  . BACK SURGERY    . BREAST SURGERY    . CHOLECYSTECTOMY    . COLONOSCOPY WITH PROPOFOL N/A 06/02/2017   Procedure: COLONOSCOPY WITH PROPOFOL;  Surgeon: Jonathon Bellows, MD;  Location: [facility] ENDOSCOPY;  Service: Gastroenterology;  Laterality: N/A;  . ESOPHAGOGASTRODUODENOSCOPY (EGD) WITH PROPOFOL N/A 07/01/2017   Procedure: ESOPHAGOGASTRODUODENOSCOPY (EGD) WITH PROPOFOL;  Surgeon: Jonathon Bellows, MD;  Location: [facility] ENDOSCOPY;  Service: Gastroenterology;  Laterality: N/A;  . FLEXIBLE SIGMOIDOSCOPY N/A 07/01/2017   Procedure: FLEXIBLE SIGMOIDOSCOPY;  Surgeon: Jonathon Bellows, MD;  Location: [facility] ENDOSCOPY;  Service: Gastroenterology;   Laterality: N/A;  . KNEE SURGERY    . mini gastric bypass    . SHOULDER SURGERY    . TONSILLECTOMY      SOCIAL HISTORY:  Social History   Socioeconomic History  . Marital status: Married    Spouse name: Not on file  . Number of children: Not on file  . Years of education: Not on file  . Highest education level: Not on file  Occupational History  . Not on file  Tobacco Use  . Smoking status: Never Smoker  . Smokeless tobacco: Never Used  Vaping Use  . Vaping Use: Never used  Substance and Sexual Activity  . Alcohol use: No    Alcohol/week: 0.0 standard drinks  . Drug use: Never  . Sexual activity: Never  Other Topics Concern  . Not on file  Social History Narrative  . Not on file   Social Determinants of Health   Financial Resource Strain:   . Difficulty of Paying Living Expenses: Not on file  Food Insecurity:   . Worried About Charity fundraiser in the Last Year: Not on file  . Ran Out of Food in the Last Year: Not on file  Transportation Needs:   . Lack of Transportation (Medical): Not on file  . Lack of Transportation (Non-Medical): Not on file  Physical Activity:   . Days of Exercise per Week: Not on file  . Minutes of Exercise per Session: Not on file  Stress:   . Feeling of Stress : Not on file  Social Connections:   . Frequency of Communication with Friends and Family: Not on file  . Frequency  of Social Gatherings with Friends and Family: Not on file  . Attends Religious Services: Not on file  . Active Member of Clubs or Organizations: Not on file  . Attends Archivist Meetings: Not on file  . Marital Status: Not on file  Intimate Partner Violence:   . Fear of Current or Ex-Partner: Not on file  . Emotionally Abused: Not on file  . Physically Abused: Not on file  . Sexually Abused: Not on file    FAMILY HISTORY:  Family History  Problem Relation Age of Onset  . Hypertension Other   . Stroke Other   . Diabetes Other   . Heart attack  Other   . Obesity Other   . Diabetes Father   . Heart disease Father   . Colon cancer Father   . Breast cancer Paternal Grandmother   . Healthy Daughter   . Stomach cancer Neg Hx   . Pancreatic cancer Neg Hx     CURRENT MEDICATIONS:  Current Outpatient Medications  Medication Sig Dispense Refill  . calcitRIOL (ROCALTROL) 0.5 MCG capsule Take 0.5 mcg by mouth daily.  5  . Cholecalciferol (VITAMIN D3) 5000 UNITS CAPS Take 1 capsule by mouth daily.     Marland Kitchen denosumab (PROLIA) 60 MG/ML SOSY injection Inject 60 mg into the skin every 6 (six) months.    . diphenhydrAMINE (BENADRYL) 25 MG tablet Take 25 mg by mouth at bedtime as needed.     . Melatonin 5 MG CAPS Take by mouth as needed.     . Multiple Vitamin (MULTIVITAMIN) tablet Take 1 tablet by mouth 2 (two) times daily.     Marland Kitchen VITAMIN A PO Take 1 capsule by mouth daily.     Marland Kitchen VITAMIN E PO Take 1 capsule by mouth daily.      No current facility-administered medications for this visit.    ALLERGIES:  Allergies  Allergen Reactions  . Lac Bovis Diarrhea  . Prednisone Diarrhea  . Sulfa Antibiotics Itching  . Tape Dermatitis  . Zithromax [Azithromycin] Diarrhea    PHYSICAL EXAM:  Performance status (ECOG): 1 - Symptomatic but completely ambulatory  Vitals:   05/09/20 1431  BP: 123/72  Pulse: 71  Resp: 18  Temp: (!) 97.3 F (36.3 C)  SpO2: 98%   Wt Readings from Last 3 Encounters:  05/09/20 191 lb (86.6 kg)  11/01/19 190 lb (86.2 kg)  09/26/19 178 lb (80.7 kg)   Physical Exam Vitals reviewed.  Constitutional:      Appearance: Normal appearance. She is obese.  Cardiovascular:     Rate and Rhythm: Normal rate and regular rhythm.     Pulses: Normal pulses.     Heart sounds: Normal heart sounds.  Pulmonary:     Effort: Pulmonary effort is normal.     Breath sounds: Normal breath sounds.  Abdominal:     Palpations: Abdomen is soft. There is no hepatomegaly, splenomegaly or mass.     Tenderness: There is no abdominal  tenderness.     Hernia: No hernia is present.  Neurological:     General: No focal deficit present.     Mental Status: She is alert and oriented to person, place, and time.  Psychiatric:        Mood and Affect: Mood normal.        Behavior: Behavior normal.     LABORATORY DATA:  I have reviewed the labs as listed.  CBC Latest Ref Rng & Units 05/02/2020 09/05/2019 05/01/2019  WBC 4.0 - 10.5 K/uL 6.1 4.5 5.6  Hemoglobin 12.0 - 15.0 g/dL 13.3 13.0 13.1  Hematocrit 36 - 46 % 42.3 41.0 42.0  Platelets 150 - 400 K/uL 185 176 175   CMP Latest Ref Rng & Units 05/02/2020 09/05/2019 05/01/2019  Glucose 70 - 99 mg/dL 101(H) 98 92  BUN 8 - 23 mg/dL 10 10 15   Creatinine 0.44 - 1.00 mg/dL 0.58 0.50 0.50  Sodium 135 - 145 mmol/L 142 140 141  Potassium 3.5 - 5.1 mmol/L 4.4 3.8 3.9  Chloride 98 - 111 mmol/L 110 104 111  CO2 22 - 32 mmol/L 26 28 23   Calcium 8.9 - 10.3 mg/dL 8.5(L) 8.0(L) 8.5(L)  Total Protein 6.5 - 8.1 g/dL 6.1(L) 6.0(L) 6.1(L)  Total Bilirubin 0.3 - 1.2 mg/dL 0.6 0.8 0.5  Alkaline Phos 38 - 126 U/L 46 44 43  AST 15 - 41 U/L 28 58(H) 18  ALT 0 - 44 U/L 26 41 21      Component Value Date/Time   RBC 4.24 05/02/2020 1308   MCV 99.8 05/02/2020 1308   MCV 96 09/25/2016 1518   MCH 31.4 05/02/2020 1308   MCHC 31.4 05/02/2020 1308   RDW 12.7 05/02/2020 1308   RDW 15.5 (H) 09/25/2016 1518   LYMPHSABS 1.2 05/02/2020 1308   LYMPHSABS 1.2 09/25/2016 1518   MONOABS 0.4 05/02/2020 1308   EOSABS 0.1 05/02/2020 1308   EOSABS 0.1 09/25/2016 1518   BASOSABS 0.0 05/02/2020 1308   BASOSABS 0.0 09/25/2016 1518   Lab Results  Component Value Date   TIBC 307 05/02/2020   TIBC 343 09/05/2019   TIBC 347 05/01/2019   FERRITIN 124 05/02/2020   FERRITIN 112 09/05/2019   FERRITIN 108 05/01/2019   IRONPCTSAT 26 05/02/2020   IRONPCTSAT 21 09/05/2019   IRONPCTSAT 26 05/01/2019    DIAGNOSTIC IMAGING:  I have independently reviewed the scans and discussed with the patient. No results  found.   ASSESSMENT:  1.  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 -Colonoscopy in September 2018 did not show any major bleeding sources. -Feraheme on 09/29/2019.   PLAN:  1.  Iron deficiency anemia: -She reported that Feraheme gave her energy.  We reviewed labs from 05/02/2020. -Hemoglobin improved to 13.3.  Ferritin is 124 with percent saturation of 26.  Vitamin B12 is 531. -He does not require any parenteral iron therapy at this time. -We will see her back in 6 months with repeat labs.  She was told to call us should she develop any severe tiredness.   Orders placed this encounter:  No orders of the defined types were placed in this encounter.    Derek Jack, MD [place] [phone]   I, Milinda Antis, am acting as a scribe for Dr. Sanda Linger.  I, Derek Jack MD, have reviewed the above documentation for accuracy and completeness, and I agree with the above.

## 2020-05-09 NOTE — Patient Instructions (Signed)
[facility] at [facility] Discharge Instructions  You were seen today by Dr. Katragadda. He went over your recent results. Dr. Katragadda will see you back in 6 months for labs and follow up.   Thank you for choosing [facility] at [facility] to provide your oncology and hematology care.  To afford each patient quality time with our provider, please arrive at least 15 minutes before your scheduled appointment time.   If you have a lab appointment with the Cancer Center please come in thru the Main Entrance and check in at the main information desk  You need to re-schedule your appointment should you arrive 10 or more minutes late.  We strive to give you quality time with our providers, and arriving late affects you and other patients whose appointments are after yours.  Also, if you no show three or more times for appointments you may be dismissed from the clinic at the providers discretion.     Again, thank you for choosing [facility].  Our hope is that these requests will decrease the amount of time that you wait before being seen by our physicians.       _____________________________________________________________  Should you have questions after your visit to [facility], please contact our office at [phone] between the hours of 8:00 a.m. and 4:30 p.m.  Voicemails left after 4:00 p.m. will not be returned until the following business day.  For prescription refill requests, have your pharmacy contact our office and allow 72 hours.    Cancer Center Support Programs:   > Cancer Support Group  2nd Tuesday of the month 1pm-2pm, Journey Room    

## 2020-07-04 ENCOUNTER — Other Ambulatory Visit: Payer: Self-pay | Admitting: Physician Assistant

## 2020-07-04 DIAGNOSIS — M1811 Unilateral primary osteoarthritis of first carpometacarpal joint, right hand: Secondary | ICD-10-CM

## 2020-07-04 DIAGNOSIS — S63601A Unspecified sprain of right thumb, initial encounter: Secondary | ICD-10-CM

## 2020-07-04 DIAGNOSIS — S63619A Unspecified sprain of unspecified finger, initial encounter: Secondary | ICD-10-CM

## 2020-07-09 ENCOUNTER — Ambulatory Visit (HOSPITAL_COMMUNITY): Payer: Medicare Other | Attending: Physician Assistant

## 2020-07-09 ENCOUNTER — Other Ambulatory Visit: Payer: Self-pay

## 2020-07-09 DIAGNOSIS — M25641 Stiffness of right hand, not elsewhere classified: Secondary | ICD-10-CM | POA: Diagnosis not present

## 2020-07-09 DIAGNOSIS — M79641 Pain in right hand: Secondary | ICD-10-CM | POA: Insufficient documentation

## 2020-07-09 DIAGNOSIS — M79644 Pain in right finger(s): Secondary | ICD-10-CM

## 2020-07-09 DIAGNOSIS — R29898 Other symptoms and signs involving the musculoskeletal system: Secondary | ICD-10-CM

## 2020-07-09 NOTE — Patient Instructions (Signed)
AROM: DIP Flexion / Extension   Pinch middle knuckle of ________ finger of right hand to prevent bending. Bend end knuckle until stretch is felt. Hold 5 seconds. Relax. Straighten finger as far as possible.  Copyright  VHI. All rights reserved.    AROM: PIP Flexion / Extension   Pinch bottom knuckle of each finger of right hand to prevent bending. Actively bend middle knuckle until stretch is felt. Hold 5 seconds. Relax. Straighten finger as far as possible.   Copyright  VHI. All rights reserved.   AROM: Finger Flexion / Extension   Actively bend fingers of right hand. Start with knuckles furthest from palm, and slowly make a fist. Hold 5 seconds. Relax. Then straighten fingers as far as possible.   Copyright  VHI. All rights reserved.   Paper Crumpling Exercise   Begin with right palm down on piece of paper. Maintaining contact between surface and heel of hand, crumple paper into a ball.   Copyright  VHI. All rights reserved.     Towel Roll Squeeze   With right forearm resting on surface, gently squeeze towel.   Copyright  VHI. All rights reserved.   Copyright  VHI. All rights reserved.  AROM: Thumb Abduction / Adduction   Actively pull right thumb away from palm as far as possible. Hold 5 seconds. Then bring thumb back to touch fingers. Try not to bend fingers toward thumb.   Copyright  VHI. All rights reserved.

## 2020-07-09 NOTE — Therapy (Addendum)
[place], [place], [address] Phone: [phone]   Fax:  [phone]  Occupational Therapy Evaluation  Patient Details  Name: [name] MRN: [id number] Date of Birth: Nov 07, 1950 Referring Provider (OT): Matthew Saras, PA-C (Dr. Noemi Chapel)   Encounter Date: 07/09/2020   OT End of Session - 07/09/20 1210    Visit Number 1    Number of Visits 5    Date for OT Re-Evaluation 08/06/20    Authorization Type UHC Medicare, no copay, no visit limit    Progress Note Due on Visit 10    OT Start Time 0855    OT Stop Time 0948    OT Time Calculation (min) 53 min    Activity Tolerance Patient tolerated treatment well    Behavior During Therapy [facility] for tasks assessed/performed           Past Medical History:  Diagnosis Date   Asthma    GERD (gastroesophageal reflux disease)    Internal hemorrhoids    Iron deficiency anemia    Osteoporosis    Peptic ulcer    Tubular adenoma of colon     Past Surgical History:  Procedure Laterality Date   BACK SURGERY     BREAST SURGERY     CHOLECYSTECTOMY     COLONOSCOPY WITH PROPOFOL N/A 06/02/2017   Procedure: COLONOSCOPY WITH PROPOFOL;  Surgeon: Jonathon Bellows, MD;  Location: [facility] ENDOSCOPY;  Service: Gastroenterology;  Laterality: N/A;   ESOPHAGOGASTRODUODENOSCOPY (EGD) WITH PROPOFOL N/A 07/01/2017   Procedure: ESOPHAGOGASTRODUODENOSCOPY (EGD) WITH PROPOFOL;  Surgeon: Jonathon Bellows, MD;  Location: [facility] ENDOSCOPY;  Service: Gastroenterology;  Laterality: N/A;   FLEXIBLE SIGMOIDOSCOPY N/A 07/01/2017   Procedure: FLEXIBLE SIGMOIDOSCOPY;  Surgeon: Jonathon Bellows, MD;  Location: [facility] ENDOSCOPY;  Service: Gastroenterology;  Laterality: N/A;   KNEE SURGERY     mini gastric bypass     SHOULDER SURGERY     TONSILLECTOMY      There were no vitals filed for this visit.   Subjective Assessment - 07/09/20 0859    Subjective  S: I really didn't want surgery so I've  been working on it    Pertinent History Patient is a 69 y/o female s/p multiple digit sprains of right hand resulting from a fall on 07/03/20, where patient reports attempting to brace herself with an outstratched hand and her 1st through 3rd digits were straight and jammed into the floor as she fell. Patient reports that she has been receiving PRP injections at [facility] in [place], [place] which she indicates has significantly helped the pain and recovery since her fall. 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 Patient is referred by Matthew Saras, PA, for OT evaluation and treatment.    Patient Stated Goals Be able to use my hand    Currently in Pain? No/denies   Currently 0/10, 7 or 8/10 last night   Pain Score 7     Pain Location Hand   Base of thumb at the [facility] joint   Pain Orientation Right    Pain Descriptors / Indicators Stabbing    Pain Type Acute pain    Pain Radiating Towards N/A    Pain Onset In the past 7 days   pain from the fall is within the last week, OA  is more than a year   Pain Frequency Intermittent    Aggravating Factors  Using the thumb, trying to grip items    Pain Relieving Factors heat, patient reports that [facility] clinic advised her not to use ice due to PRP injection treatments    Effect of Pain on Daily Activities mod    Multiple Pain Sites No             [facility] OT Assessment - 07/09/20 0909      Assessment   Medical Diagnosis Sprain of multiple digits of right hand, OA of CMC joint    Referring Provider (OT) Matthew Saras, PA-C   Dr. Noemi Chapel   Onset Date/Surgical Date --   Date of patient's fall 07/03/20, OA pain for several years   Hand Dominance Right    Next MD Visit 08/15/20 (for digit sprains), 08/20/20 (for OA)    Prior Therapy none reported      Precautions    Precautions None    Required Braces or Orthoses Other Brace/Splint    Other Brace/Splint thumb spica splint, wearing as needed      Restrictions   Weight Bearing Restrictions No      Balance Screen   Has the patient fallen in the past 6 months Yes    How many times? 1    Has the patient had a decrease in activity level because of a fear of falling?  Yes    Is the patient reluctant to leave their home because of a fear of falling?  Yes   Patient reports having PT prior, and is not interested     Home  Environment   Family/patient expects to be discharged to: Private residence    Lives With Spouse   husband     Prior Function   Level of Independence Independent    Vocation Retired    Leisure Travel      ADL   ADL comments Patient reports difficulty with opening containers and jars, cutting up vegetables and peeling foods, hooking her bra,  pulling socks up and occasional dressing activities due to pain, lifting      Observation/Other Assessments   Other Surveys  --      ROM / Strength   AROM / PROM / Strength AROM;Strength;PROM      Palpation   Palpation comment Min-mod fascial restrictions palpated at MCP joint, with moderate swelling and bruising noted      AROM   Overall AROM  Deficits    AROM Assessment Site Wrist;Finger;Thumb    Right/Left Wrist Right    Right Wrist Extension 40 Degrees    Right Wrist Flexion 64 Degrees    Right Wrist Radial Deviation 12 Degrees    Right Wrist Ulnar Deviation 30 Degrees    Left Wrist Extension 52 Degrees    Left Wrist Flexion 40 Degrees    Left Wrist Radial Deviation 20 Degrees    Left Wrist Ulnar Deviation 40 Degrees      PROM   Overall PROM  Within functional limits for tasks performed    PROM Assessment Site Wrist;Finger;Thumb    Right/Left Wrist Right    Right Wrist Extension 48 Degrees    Right Wrist Flexion 80 Degrees    Right Wrist Radial Deviation 28 Degrees    Right Wrist Ulnar Deviation 40 Degrees      Strength    Strength Assessment Site Hand    Right/Left hand Right;Left    Right Hand Grip (lbs) 20  Right Hand Lateral Pinch 6 lbs    Right Hand 3 Point Pinch 4 lbs    Left Hand Grip (lbs) 40    Left Hand Lateral Pinch 16 lbs    Left Hand 3 Point Pinch 14 lbs      Right Hand AROM   R Thumb MCP 0-60 48 Degrees    R Thumb IP 0-80 30 Degrees    R Index  MCP 0-90 60 Degrees    R Index PIP 0-100 76 Degrees    R Index DIP 0-70 40 Degrees    R Long  MCP 0-90 62 Degrees    R Long PIP 0-100 76 Degrees    R Long DIP 0-70 40 Degrees    R Ring  MCP 0-90 62 Degrees    R Ring PIP 0-100 80 Degrees    R Ring DIP 0-70 32 Degrees    R Little  MCP 0-90 64 Degrees    R Little PIP 0-100 70 Degrees    R Little DIP 0-70 64 Degrees      Right Hand PROM   R Thumb MCP 0-60 64 Degrees    R Thumb IP 0-80 54 Degrees                           OT Education - 07/09/20 1255    Education Details A/ROM of hand    Person(s) Educated Patient    Methods Explanation;Handout;Verbal cues    Comprehension Verbalized understanding;Returned demonstration;Verbal cues required            OT Short Term Goals - 07/09/20 1241      OT SHORT TERM GOAL #1   Title Patient will be educated and independent in HEP in order to promote increased functional use of her right hand in desired daily tasks.    Time 4    Period Weeks    Status New    Target Date 08/06/20      OT SHORT TERM GOAL #2   Title Patient will report a pain level of at least a 3/10 or less while using right hand as dominant in daily self-care and meal prep tasks.    Time 4    Period Weeks    Status New      OT SHORT TERM GOAL #3   Title Patient will improve A/ROM in order to promote patient's ability to form a more functional fist, needed for desired daily tasks such as dressing and cooking with greater ease.    Time 4    Period Weeks    Status New      OT SHORT TERM GOAL #4   Title Patient will improve grip strength by at least 8# and  pinch strength by at least 4# in order to promote patients ability to grip and utilize right hand as dominant while cooking and opening containers.    Time 4    Period Weeks    Status New                    Plan - 07/09/20 1212    Clinical Impression Statement A: Patient is a 69 y/o female s/p multiple digit sprains of right hand and OA of CMC joint, causing stiffness, increased pain, decreased A/ROM and strength, resulting in difficulty with opening containers, doing basic self-care tasks such as hooking her bra and other skills needed for functional independence.    OT Occupational Profile and  History Problem Focused Assessment - Including review of records relating to presenting problem    Occupational performance deficits (Please refer to evaluation for details): ADL's;IADL's;Rest and Sleep;Leisure    Body Structure / Function / Physical Skills ADL;Endurance;Muscle spasms;UE functional use;Fascial restriction;Pain;ROM;Coordination;Strength;IADL    Rehab Potential Fair    Clinical Decision Making Several treatment options, min-mod task modification necessary    Comorbidities Affecting Occupational Performance: Presence of comorbidities impacting occupational performance    Comorbidities impacting occupational performance description: OA    Modification or Assistance to Complete Evaluation  Min-Moderate modification of tasks or assist with assess necessary to complete eval    OT Frequency 1x / week    OT Duration 4 weeks    OT Treatment/Interventions Self-care/ADL training;DME and/or AE instruction;Patient/family education;Passive range of motion;Cryotherapy;Therapeutic exercise;Manual Therapy;Therapeutic activities    Plan P: Patient will benefit from skilled OT intervention to improve patient's functional use of her right hand, by decreasing pain, increasing A/ROM and strength, and educating on home and self-care management techniques that paitent can utilize to promote her  independence. Treatment plan: manual techniques, passive stretches, A/ROM, grip/pinch strengthening, adaptive equipment instruction and education on pain management techniques, modalites prn. Next session: complete DASH, assess coordination and make goal if applicable    OT Home Exercise Plan Eval 10/26: A/ROM hand,    Consulted and Agree with Plan of Care Patient           Patient will benefit from skilled therapeutic intervention in order to improve the following deficits and impairments:   Body Structure / Function / Physical Skills: ADL, Endurance, Muscle spasms, UE functional use, Fascial restriction, Pain, ROM, Coordination, Strength, IADL       Visit Diagnosis: Stiffness of right hand, not elsewhere classified  Pain in right hand  Other symptoms and signs involving the musculoskeletal system  Pain in right finger(s)    Problem List Patient Active Problem List   Diagnosis Date Noted   Age-related osteoporosis without current pathological fracture 10/30/2019   Left-sided weakness    Stroke-like symptoms 02/24/2018   HTN (hypertension) 02/24/2018   Primary osteoarthritis of both hands 11/03/2017   Dupuytren's contracture of left hand 11/03/2017   DDD (degenerative disc disease), lumbar 11/03/2017   History of gastric bypass 11/03/2017   Lymphedema 09/17/2017   History of vertebral fracture 09/17/2017   History of osteoporosis 09/17/2017   Iron deficiency anemia due to chronic blood loss 06/28/2017   Lower GI bleed 05/31/2017   Rectal mass 05/31/2017   Asthma 05/31/2017   GERD (gastroesophageal reflux disease) 05/31/2017   Closed fracture of upper end of humerus 09/23/2016   Tendinitis 09/23/2016   Patulous eustachian tube of right ear 11/07/2015   Superior semicircular canal dehiscence, bilateral 11/07/2015   Vitamin D deficiency 08/20/2014   Secondary hyperparathyroidism ([place]) 08/20/2014     [place], [place]  Student [phone]   07/09/2020, 1:21 PM  [facility] [address] [place], [place], [address] Phone: [phone]   Fax:  [phone]  Name: [name] MRN: [id number] Date of Birth: Aug 28, 1951

## 2020-07-09 NOTE — Addendum Note (Signed)
Addended by: Ailene Ravel D on: 07/09/2020 01:31 PM   Modules accepted: Orders

## 2020-07-18 ENCOUNTER — Other Ambulatory Visit: Payer: Self-pay

## 2020-07-18 ENCOUNTER — Ambulatory Visit (HOSPITAL_COMMUNITY): Payer: Medicare Other | Attending: Physician Assistant

## 2020-07-18 ENCOUNTER — Encounter (HOSPITAL_COMMUNITY): Payer: Self-pay

## 2020-07-18 DIAGNOSIS — M79641 Pain in right hand: Secondary | ICD-10-CM | POA: Diagnosis present

## 2020-07-18 DIAGNOSIS — R29898 Other symptoms and signs involving the musculoskeletal system: Secondary | ICD-10-CM | POA: Diagnosis present

## 2020-07-18 DIAGNOSIS — M25641 Stiffness of right hand, not elsewhere classified: Secondary | ICD-10-CM

## 2020-07-18 DIAGNOSIS — M79644 Pain in right finger(s): Secondary | ICD-10-CM | POA: Diagnosis present

## 2020-07-18 NOTE — Therapy (Signed)
[place], [place], [address] Phone: [phone]   Fax:  [phone]  Occupational Therapy Treatment  Patient Details  Name: [name] MRN: [id number] Date of Birth: 1950/10/24 Referring Provider (OT): Matthew Saras, PA-C (Dr. Noemi Chapel)   Encounter Date: 07/18/2020   OT End of Session - 07/18/20 1528    Visit Number 2    Number of Visits 5    Date for OT Re-Evaluation 08/06/20    Authorization Type UHC Medicare, no copay, no visit limit    Progress Note Due on Visit 10    OT Start Time 1346    OT Stop Time 1427    OT Time Calculation (min) 41 min    Activity Tolerance Patient tolerated treatment well    Behavior During Therapy [facility] for tasks assessed/performed           Past Medical History:  Diagnosis Date  . Asthma   . GERD (gastroesophageal reflux disease)   . Internal hemorrhoids   . Iron deficiency anemia   . Osteoporosis   . Peptic ulcer   . Tubular adenoma of colon     Past Surgical History:  Procedure Laterality Date  . BACK SURGERY    . BREAST SURGERY    . CHOLECYSTECTOMY    . COLONOSCOPY WITH PROPOFOL N/A 06/02/2017   Procedure: COLONOSCOPY WITH PROPOFOL;  Surgeon: Jonathon Bellows, MD;  Location: [facility] ENDOSCOPY;  Service: Gastroenterology;  Laterality: N/A;  . ESOPHAGOGASTRODUODENOSCOPY (EGD) WITH PROPOFOL N/A 07/01/2017   Procedure: ESOPHAGOGASTRODUODENOSCOPY (EGD) WITH PROPOFOL;  Surgeon: Jonathon Bellows, MD;  Location: [facility] ENDOSCOPY;  Service: Gastroenterology;  Laterality: N/A;  . FLEXIBLE SIGMOIDOSCOPY N/A 07/01/2017   Procedure: FLEXIBLE SIGMOIDOSCOPY;  Surgeon: Jonathon Bellows, MD;  Location: [facility] ENDOSCOPY;  Service: Gastroenterology;  Laterality: N/A;  . KNEE SURGERY    . mini gastric bypass    . SHOULDER SURGERY    . TONSILLECTOMY      There were no vitals filed for this visit.   Subjective Assessment - 07/18/20 1352    Subjective  S: I've talked to multiple doctors about  my wrist    Currently in Pain? No/denies              [facility] OT Assessment - 07/18/20 1535      Assessment   Medical Diagnosis Sprain of multiple digits of right hand, OA of CMC joint      Precautions   Precautions None    Required Braces or Orthoses Other Brace/Splint    Other Brace/Splint thumb spica splint, wearing as needed                    OT Treatments/Exercises (OP) - 07/18/20 1400      Exercises   Exercises Wrist;Hand;Theraputty      Wrist Exercises   Wrist Flexion PROM;5 reps    Wrist Extension PROM;5 reps    Wrist Radial Deviation PROM;5 reps    Wrist Ulnar Deviation PROM;5 reps      Hand Exercises   Thumb Opposition 10X    Other Hand Exercises P/ROM of digits, 5X      Theraputty   Theraputty - Flatten standing-yellow    Theraputty - Roll yellow    Theraputty - Grip yellow- pronated and supinated    Theraputty - Pinch yellow                  OT Education - 07/18/20 1536    Education Details  Yellow theraputty, reviewed goals, discussed how therapy won't fix arthritis but focus of therapy is pain management through increased grip and pinch strength along with modification techniques.    Person(s) Educated Patient    Methods Explanation;Handout    Comprehension Verbalized understanding            OT Short Term Goals - 07/18/20 1356      OT SHORT TERM GOAL #1   Title Patient will be educated and independent in HEP in order to promote increased functional use of her right hand in desired daily tasks.    Time 4    Period Weeks    Status On-going    Target Date 08/06/20      OT SHORT TERM GOAL #2   Title Patient will report a pain level of at least a 3/10 or less while using right hand as dominant in daily self-care and meal prep tasks.    Time 4    Period Weeks    Status On-going      OT SHORT TERM GOAL #3   Title Patient will improve A/ROM in order to promote patient's ability to form a more functional fist, needed for  desired daily tasks such as dressing and cooking with greater ease.    Time 4    Period Weeks    Status On-going      OT SHORT TERM GOAL #4   Title Patient will improve grip strength by at least 8# and pinch strength by at least 4# in order to promote patients ability to grip and utilize right hand as dominant while cooking and opening containers.    Time 4    Period Weeks    Status On-going                    Plan - 07/18/20 1535    Clinical Impression Statement A: Patient reporting that her hand has gotten much better since she first came, indicating that the exercises provided helped loosen her fingers. Provided dycem to patient, with education on how it can be used for right affected hand for easier gripping with opening containers while cooking. Introduced theraputty exercises to promote light grip and pinch strengthening with only discomfort reported at the [facility] joint. Discussed using built up handles in the kitchen to help with arthritic pain.    Body Structure / Function / Physical Skills ADL;Endurance;Muscle spasms;UE functional use;Fascial restriction;Pain;ROM;Coordination;Strength;IADL    Plan P: Follow up on HEP, offer trying paraffin wax with education about home use, provide other modifications/techniques for arthritic pain, continue grip/pinch strengthening    OT Home Exercise Plan Eval 10/26: A/ROM hand, 11/4: yellow theraputty    Consulted and Agree with Plan of Care Patient           Patient will benefit from skilled therapeutic intervention in order to improve the following deficits and impairments:   Body Structure / Function / Physical Skills: ADL, Endurance, Muscle spasms, UE functional use, Fascial restriction, Pain, ROM, Coordination, Strength, IADL       Visit Diagnosis: Stiffness of right hand, not elsewhere classified  Pain in right hand  Other symptoms and signs involving the musculoskeletal system  Pain in right finger(s)    Problem  List Patient Active Problem List   Diagnosis Date Noted  . Age-related osteoporosis without current pathological fracture 10/30/2019  . Left-sided weakness   . Stroke-like symptoms 02/24/2018  . HTN (hypertension) 02/24/2018  . Primary osteoarthritis of both hands 11/03/2017  .  Dupuytren's contracture of left hand 11/03/2017  . DDD (degenerative disc disease), lumbar 11/03/2017  . History of gastric bypass 11/03/2017  . Lymphedema 09/17/2017  . History of vertebral fracture 09/17/2017  . History of osteoporosis 09/17/2017  . Iron deficiency anemia due to chronic blood loss 06/28/2017  . Lower GI bleed 05/31/2017  . Rectal mass 05/31/2017  . Asthma 05/31/2017  . GERD (gastroesophageal reflux disease) 05/31/2017  . Closed fracture of upper end of humerus 09/23/2016  . Tendinitis 09/23/2016  . Patulous eustachian tube of right ear 11/07/2015  . 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 Vitamin D deficiency 08/20/2014  . Secondary hyperparathyroidism [facility]) 08/20/2014     [place], [place] Student [phone]   07/18/2020, 4:03 PM  [facility] [address] [place], [place], [address] Phone: [phone]   Fax:  [phone]  Name: [name] MRN: [id number] Date of Birth: 08/30/1951

## 2020-07-18 NOTE — Patient Instructions (Signed)
Home Exercises Program Theraputty Exercises  Do the following exercises at least once a day using your affected hand.  1. Roll putty into a ball.  2. Make into a pancake.  3. Roll putty into a roll.  4. Pinch along log with first finger and thumb.   5. Make into a ball.  6. Roll it back into a log.   7. Pinch using thumb and side of first finger.  8. Roll into a ball, then flatten into a pancake.  9. Using your fingers, make putty into a mountain.  10. Roll putty back into a ball and squeeze gently for 2-3 minutes.

## 2020-07-25 ENCOUNTER — Other Ambulatory Visit: Payer: Self-pay

## 2020-07-25 ENCOUNTER — Encounter (HOSPITAL_COMMUNITY): Payer: Self-pay

## 2020-07-25 ENCOUNTER — Ambulatory Visit (HOSPITAL_COMMUNITY): Payer: Medicare Other

## 2020-07-25 DIAGNOSIS — M25641 Stiffness of right hand, not elsewhere classified: Secondary | ICD-10-CM | POA: Diagnosis not present

## 2020-07-25 DIAGNOSIS — R29898 Other symptoms and signs involving the musculoskeletal system: Secondary | ICD-10-CM

## 2020-07-25 DIAGNOSIS — M79641 Pain in right hand: Secondary | ICD-10-CM

## 2020-07-25 DIAGNOSIS — M79644 Pain in right finger(s): Secondary | ICD-10-CM

## 2020-07-25 NOTE — Therapy (Signed)
[place], [place], [address] Phone: [phone]   Fax:  [phone]  Occupational Therapy Treatment  Patient Details  Name: [name] MRN: [id number] Date of Birth: Mar 07, 1951 Referring Provider (OT): Matthew Saras, PA-C (Dr. Noemi Chapel)   Encounter Date: 07/25/2020   OT End of Session - 07/25/20 1646    Visit Number 3    Number of Visits 5    Date for OT Re-Evaluation 08/06/20    Authorization Type UHC Medicare, no copay, no visit limit    Progress Note Due on Visit 10    OT Start Time 1605    OT Stop Time 1646    OT Time Calculation (min) 41 min    Activity Tolerance Patient tolerated treatment well    Behavior During Therapy [facility] for tasks assessed/performed           Past Medical History:  Diagnosis Date  . Asthma   . GERD (gastroesophageal reflux disease)   . Internal hemorrhoids   . Iron deficiency anemia   . Osteoporosis   . Peptic ulcer   . Tubular adenoma of colon     Past Surgical History:  Procedure Laterality Date  . BACK SURGERY    . BREAST SURGERY    . CHOLECYSTECTOMY    . COLONOSCOPY WITH PROPOFOL N/A 06/02/2017   Procedure: COLONOSCOPY WITH PROPOFOL;  Surgeon: Jonathon Bellows, MD;  Location[facility] ENDOSCOPY;  Service: Gastroenterology;  Laterality: N/A;  . ESOPHAGOGASTRODUODENOSCOPY (EGD) WITH PROPOFOL N/A 07/01/2017   Procedure: ESOPHAGOGASTRODUODENOSCOPY (EGD) WITH PROPOFOL;  Surgeon: Jonathon Bellows, MD;  Location: [facility] ENDOSCOPY;  Service: Gastroenterology;  Laterality: N/A;  . FLEXIBLE SIGMOIDOSCOPY N/A 07/01/2017   Procedure: FLEXIBLE SIGMOIDOSCOPY;  Surgeon: Jonathon Bellows, MD;  Location: [facility] ENDOSCOPY;  Service: Gastroenterology;  Laterality: N/A;  . KNEE SURGERY    . mini gastric bypass    . SHOULDER SURGERY    . TONSILLECTOMY      There were no vitals filed for this visit.   Subjective Assessment - 07/25/20 1605    Subjective  S: I am losing my ability to use my  thumb joint, but my other fingers have gotten so much better    Currently in Pain? Yes   no pain right now, but reports 8 or 9 when she moves it   Pain Score 7     Pain Location Hand    Pain Orientation Right    Pain Descriptors / Indicators Stabbing    Pain Type Acute pain    Pain Radiating Towards n/a    Pain Onset In the past 7 days    Pain Frequency Intermittent    Aggravating Factors  using thumb to grip items    Pain Relieving Factors he, pt reports that QC clinic advised only heat due to PRP injections    Effect of Pain on Daily Activities mod    Multiple Pain Sites No              [facility] OT Assessment - 07/25/20 1708      Assessment   Medical Diagnosis Sprain of multiple digits of right hand, OA of CMC joint      Precautions   Precautions None    Required Braces or Orthoses Other Brace/Splint    Other Brace/Splint thumb spica splint, wearing as needed                    OT Treatments/Exercises (OP) - 07/25/20 1706  Exercises   Exercises Wrist;Hand      Hand Exercises   Other Hand Exercises P/ROM of digits, 5X      Neurological Re-education Exercises   Finger Flexion A/ROM 5 reps    Finger Extension A/ROM 5 reps    Thumb Opposition A/ROM 5 reps      Modalities   Modalities Paraffin      RUE Paraffin   Number Minutes Paraffin 6 Minutes    RUE Paraffin Location Hand   and wrist                 OT Education - 07/25/20 1654    Education Details Provided handouts regarding joint protection do's and dont's for arthritis of the thumb and CMC joint. Provided thumb exercises to increase A/ROM and decrease stiffness-related pain. Provided resource for purchasing paraffin wax machine for home use to assist with pain management. Discussed purpose of paraffin wax and how it helps with arthritic pain    Person(s) Educated Patient    Methods Explanation;Handout    Comprehension Verbalized understanding            OT Short Term Goals -  07/18/20 1356      OT SHORT TERM GOAL #1   Title Patient will be educated and independent in HEP in order to promote increased functional use of her right hand in desired daily tasks.    Time 4    Period Weeks    Status On-going    Target Date 08/06/20      OT SHORT TERM GOAL #2   Title Patient will report a pain level of at least a 3/10 or less while using right hand as dominant in daily self-care and meal prep tasks.    Time 4    Period Weeks    Status On-going      OT SHORT TERM GOAL #3   Title Patient will improve A/ROM in order to promote patient's ability to form a more functional fist, needed for desired daily tasks such as dressing and cooking with greater ease.    Time 4    Period Weeks    Status On-going      OT SHORT TERM GOAL #4   Title Patient will improve grip strength by at least 8# and pinch strength by at least 4# in order to promote patients ability to grip and utilize right hand as dominant while cooking and opening containers.    Time 4    Period Weeks    Status On-going                    Plan - 07/25/20 1657    Clinical Impression Statement A: Patient with increased pain with movement today. Educated patient on use of paraffin wax, with patient reporting significant improvement in pain level following paraffin treatment. Provided A/ROM thumb exercises, along with joint protection recommendations this date. Discussed making next session her last session due to focus of therapy transitioning to education on modifications, versus fixing the arthritis. Patient in agreement with one more session to have updated HEP as well as details and directions for paraffin use at home.    Body Structure / Function / Physical Skills ADL;Endurance;Muscle spasms;UE functional use;Fascial restriction;Pain;ROM;Coordination;Strength;IADL    Plan P: Plan on discharging if applicable. Follow up if patient purchased paraffin wax for home use, look at HEP and add anything  applicable for joint protection or modifications    Consulted and Agree with Plan of Care  Patient           Patient will benefit from skilled therapeutic intervention in order to improve the following deficits and impairments:   Body Structure / Function / Physical Skills: ADL, Endurance, Muscle spasms, UE functional use, Fascial restriction, Pain, ROM, Coordination, Strength, IADL       Visit Diagnosis: Stiffness of right hand, not elsewhere classified  Pain in right hand  Other symptoms and signs involving the musculoskeletal system  Pain in right finger(s)    Problem List Patient Active Problem List   Diagnosis Date Noted  . Age-related osteoporosis without current pathological fracture 10/30/2019  . Left-sided weakness   . Stroke-like symptoms 02/24/2018  . HTN (hypertension) 02/24/2018  . Primary osteoarthritis of both hands 11/03/2017  . Dupuytren's contracture of left hand 11/03/2017  . DDD (degenerative disc disease), lumbar 11/03/2017  . History of gastric bypass 11/03/2017  . Lymphedema 09/17/2017  . History of vertebral fracture 09/17/2017  . History of osteoporosis 09/17/2017  . Iron deficiency anemia due to chronic blood loss 06/28/2017  . Lower GI bleed 05/31/2017  . Rectal mass 05/31/2017  . Asthma 05/31/2017  . GERD (gastroesophageal reflux disease) 05/31/2017  . Closed fracture of upper end of humerus 09/23/2016  . Tendinitis 09/23/2016  . Patulous eustachian tube of right ear 11/07/2015  . 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 Vitamin D deficiency 08/20/2014  . Secondary hyperparathyroidism [facility]) 08/20/2014     [place], [place] Student [phone]   07/25/2020, 5:09 PM  [place], [place], [address] Phone: [phone]   Fax:  [phone]  Name: Aayat Ryals Burns MRN: [id number] Date of Birth: 19-Oct-1950

## 2020-08-01 ENCOUNTER — Ambulatory Visit (HOSPITAL_COMMUNITY): Payer: Medicare Other

## 2020-08-01 ENCOUNTER — Encounter (HOSPITAL_COMMUNITY): Payer: Self-pay

## 2020-08-01 ENCOUNTER — Other Ambulatory Visit: Payer: Self-pay

## 2020-08-01 DIAGNOSIS — M25641 Stiffness of right hand, not elsewhere classified: Secondary | ICD-10-CM | POA: Diagnosis not present

## 2020-08-01 DIAGNOSIS — M79641 Pain in right hand: Secondary | ICD-10-CM

## 2020-08-01 DIAGNOSIS — R29898 Other symptoms and signs involving the musculoskeletal system: Secondary | ICD-10-CM

## 2020-08-01 DIAGNOSIS — M79644 Pain in right finger(s): Secondary | ICD-10-CM

## 2020-08-01 NOTE — Therapy (Signed)
[place], [place], [address] Phone: [phone]   Fax:  [phone]  Occupational Therapy Treatment Reassessment and discharge summary Patient Details  Name: [name] MRN: [id number] Date of Birth: 1951/06/20 Referring Provider (OT): Matthew Saras, PA-C (Dr. Noemi Chapel)   Encounter Date: 08/01/2020   OT End of Session - 08/01/20 1224    Visit Number 4    Number of Visits 5    Authorization Type UHC Medicare, no copay, no visit limit    Progress Note Due on Visit 10    OT Start Time 1035   reassess and discharge   OT Stop Time 1113    OT Time Calculation (min) 38 min    Activity Tolerance Patient tolerated treatment well    Behavior During Therapy [facility] for tasks assessed/performed           Past Medical History:  Diagnosis Date  . Asthma   . GERD (gastroesophageal reflux disease)   . Internal hemorrhoids   . Iron deficiency anemia   . Osteoporosis   . Peptic ulcer   . Tubular adenoma of colon     Past Surgical History:  Procedure Laterality Date  . BACK SURGERY    . BREAST SURGERY    . CHOLECYSTECTOMY    . COLONOSCOPY WITH PROPOFOL N/A 06/02/2017   Procedure: COLONOSCOPY WITH PROPOFOL;  Surgeon: Jonathon Bellows, MD;  Location: [facility] ENDOSCOPY;  Service: Gastroenterology;  Laterality: N/A;  . ESOPHAGOGASTRODUODENOSCOPY (EGD) WITH PROPOFOL N/A 07/01/2017   Procedure: ESOPHAGOGASTRODUODENOSCOPY (EGD) WITH PROPOFOL;  Surgeon: Jonathon Bellows, MD;  Location: [facility] ENDOSCOPY;  Service: Gastroenterology;  Laterality: N/A;  . FLEXIBLE SIGMOIDOSCOPY N/A 07/01/2017   Procedure: FLEXIBLE SIGMOIDOSCOPY;  Surgeon: Jonathon Bellows, MD;  Location: [facility] ENDOSCOPY;  Service: Gastroenterology;  Laterality: N/A;  . KNEE SURGERY    . mini gastric bypass    . SHOULDER SURGERY    . TONSILLECTOMY      There were no vitals filed for this visit.   Subjective Assessment - 08/01/20 1041    Subjective  S: Pinching is very  painful.    Currently in Pain? No/denies              [facility] OT Assessment - 08/01/20 1041      Assessment   Medical Diagnosis Sprain of multiple digits of right hand, OA of CMC joint      Precautions   Precautions None    Required Braces or Orthoses Other Brace/Splint    Other Brace/Splint thumb spica splint, wearing as needed      AROM   AROM Assessment Site Wrist    Right/Left Wrist Right    Right Wrist Extension 70 Degrees   previous: 40   Right Wrist Flexion 74 Degrees   previous: 64   Right Wrist Radial Deviation 20 Degrees   previous: 12   Right Wrist Ulnar Deviation 48 Degrees   previous: 30     Strength   Right Hand Grip (lbs) 45   previous: 20   Right Hand Lateral Pinch 7 lbs   previous: 6   Right Hand 3 Point Pinch 6 lbs   previous: 4                   OT Treatments/Exercises (OP) - 08/01/20 0001      ADLs   ADL Comments Discussed joint protection for right thumb joint such as using adaptive scissors with handout provided.  Exercises   Exercises Wrist;Hand      Modalities   Modalities Paraffin;Moist Heat      Moist Heat Therapy   Number Minutes Moist Heat 5 Minutes    Moist Heat Location Hand      RUE Paraffin   Number Minutes Paraffin 5 Minutes    RUE Paraffin Location Hand                  OT Education - 08/01/20 1137    Education Details reviewed progress in therapy and goals. Provided additional education for joint protection modifiied scissors. Discussed using pain to guide her during functional tasks. Complete self massage to left thumb joints. Use tennis ball for myofascial release. Use paraffin at home.    Person(s) Educated Patient    Methods Explanation;Handout    Comprehension Verbalized understanding            OT Short Term Goals - 08/01/20 1048      OT SHORT TERM GOAL #1   Title Patient will be educated and independent in HEP in order to promote increased functional use of her right hand in desired daily  tasks.    Time 4    Period Weeks    Status Achieved    Target Date 08/06/20      OT SHORT TERM GOAL #2   Title Patient will report a pain level of at least a 3/10 or less while using right hand as dominant in daily self-care and meal prep tasks.    Time 4    Period Weeks    Status Not Met      OT SHORT TERM GOAL #3   Title Patient will improve A/ROM in order to promote patient's ability to form a more functional fist, needed for desired daily tasks such as dressing and cooking with greater ease.    Time 4    Period Weeks    Status Achieved      OT SHORT TERM GOAL #4   Title Patient will improve grip strength by at least 8# and pinch strength by at least 4# in order to promote patients ability to grip and utilize right hand as dominant while cooking and opening containers.    Time 4    Period Weeks    Status Partially Met                    Plan - 08/01/20 1225    Clinical Impression Statement A: Reassessment completed this date. patient has met 2/4 goals and an additional goal partially met related to hand strength. Patient has functional hand and wrist A/ROM as she is able to now form a complete fist. Grip strength has improved although pinch is still low due to pain level with any type of pinching task. Overall, patient has done well in therapy with education provided regarding expectations of OA and management techniques such as joint protection, task modification, adaptive techniques, and not completing activities to provoke. At this time, patient is ready for discharge with all education complete.    Body Structure / Function / Physical Skills ADL;Endurance;Muscle spasms;UE functional use;Fascial restriction;Pain;ROM;Coordination;Strength;IADL    Plan P: D/C from OT services with HEP and joint protection techniques.    Consulted and Agree with Plan of Care Patient           Patient will benefit from skilled therapeutic intervention in order to improve the following  deficits and impairments:   Body Structure / Function / Physical  Skills: ADL, Endurance, Muscle spasms, UE functional use, Fascial restriction, Pain, ROM, Coordination, Strength, IADL       Visit Diagnosis: Stiffness of right hand, not elsewhere classified  Pain in right hand  Other symptoms and signs involving the musculoskeletal system  Pain in right finger(s)    Problem List Patient Active Problem List   Diagnosis Date Noted  . Age-related osteoporosis without current pathological fracture 10/30/2019  . Left-sided weakness   . Stroke-like symptoms 02/24/2018  . HTN (hypertension) 02/24/2018  . Primary osteoarthritis of both hands 11/03/2017  . Dupuytren's contracture of left hand 11/03/2017  . DDD (degenerative disc disease), lumbar 11/03/2017  . History of gastric bypass 11/03/2017  . Lymphedema 09/17/2017  . History of vertebral fracture 09/17/2017  . History of osteoporosis 09/17/2017  . Iron deficiency anemia due to chronic blood loss 06/28/2017  . Lower GI bleed 05/31/2017  . Rectal mass 05/31/2017  . Asthma 05/31/2017  . GERD (gastroesophageal reflux disease) 05/31/2017  . Closed fracture of upper end of humerus 09/23/2016  . Tendinitis 09/23/2016  . Patulous eustachian tube of right ear 11/07/2015  . 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 Vitamin D deficiency 08/20/2014  . Secondary hyperparathyroidism ([place]) 08/20/2014     OCCUPATIONAL THERAPY DISCHARGE SUMMARY  Visits from Start of Care: 4  Current functional level related to goals / functional outcomes: See above   Remaining deficits: Severe thumb pain when completing resistive gripping and pinching tasks.    Education / Equipment: See above Plan: Patient agrees to discharge.  Patient goals were partially met. Patient is being discharged due to meeting the stated rehab goals.  ?????          Ailene Ravel, OTR/L,CBIS  [phone]  08/01/2020, 12:29  PM  [place] [address] [place], [place], [address] Phone: [phone]   Fax:  [phone]  Name: [name] MRN: [id number] Date of Birth: 07/25/1951

## 2020-08-02 NOTE — Progress Notes (Deleted)
Office Visit Note  Patient: [name]             Date of Birth: 1951/04/29           MRN: [id number]             PCP: Mar Daring, PA-C Referring: Florian Buff* Visit Date: 08/15/2020 Occupation: @GUAROCC @  Subjective:  No chief complaint on file.   History of Present Illness: [name] is a 69 y.o. female ***   Activities of Daily Living:  Patient reports morning stiffness for *** {minute/hour:19697}.   Patient {ACTIONS;DENIES/REPORTS:21021675::"Denies"} nocturnal pain.  Difficulty dressing/grooming: {ACTIONS;DENIES/REPORTS:21021675::"Denies"} Difficulty climbing stairs: {ACTIONS;DENIES/REPORTS:21021675::"Denies"} Difficulty getting out of chair: {ACTIONS;DENIES/REPORTS:21021675::"Denies"} Difficulty using hands for taps, buttons, cutlery, and/or writing: {ACTIONS;DENIES/REPORTS:21021675::"Denies"}  No Rheumatology ROS completed.   PMFS History:  Patient Active Problem List   Diagnosis Date Noted  . Age-related osteoporosis without current pathological fracture 10/30/2019  . Left-sided weakness   . Stroke-like symptoms 02/24/2018  . HTN (hypertension) 02/24/2018  . Primary osteoarthritis of both hands 11/03/2017  . Dupuytren's contracture of left hand 11/03/2017  . DDD (degenerative disc disease), lumbar 11/03/2017  . History of gastric bypass 11/03/2017  . Lymphedema 09/17/2017  . History of vertebral fracture 09/17/2017  . History of osteoporosis 09/17/2017  . Iron deficiency anemia due to chronic blood loss 06/28/2017  . Lower GI bleed 05/31/2017  . Rectal mass 05/31/2017  . Asthma 05/31/2017  . GERD (gastroesophageal reflux disease) 05/31/2017  . Closed fracture of upper end of humerus 09/23/2016  . Tendinitis 09/23/2016  . Patulous eustachian tube of right ear 11/07/2015  . 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 Vitamin D deficiency 08/20/2014  . Secondary hyperparathyroidism ([place]) 08/20/2014     Past Medical History:  Diagnosis Date  . Asthma   . GERD (gastroesophageal reflux disease)   . Internal hemorrhoids   . Iron deficiency anemia   . Osteoporosis   . Peptic ulcer   . Tubular adenoma of colon     Family History  Problem Relation Age of Onset  . Hypertension Other   . Stroke Other   . Diabetes Other   . Heart attack Other   . Obesity Other   . Diabetes Father   . Heart disease Father   . Colon cancer Father   . Breast cancer Paternal Grandmother   . Healthy Daughter   . Stomach cancer Neg Hx   . Pancreatic cancer Neg Hx    Past Surgical History:  Procedure Laterality Date  . BACK SURGERY    . BREAST SURGERY    . CHOLECYSTECTOMY    . COLONOSCOPY WITH PROPOFOL N/A 06/02/2017   Procedure: COLONOSCOPY WITH PROPOFOL;  Surgeon: Jonathon Bellows, MD;  Location: [facility] ENDOSCOPY;  Service: Gastroenterology;  Laterality: N/A;  . ESOPHAGOGASTRODUODENOSCOPY (EGD) WITH PROPOFOL N/A 07/01/2017   Procedure: ESOPHAGOGASTRODUODENOSCOPY (EGD) WITH PROPOFOL;  Surgeon: Jonathon Bellows, MD;  Location: [facility] ENDOSCOPY;  Service: Gastroenterology;  Laterality: N/A;  . FLEXIBLE SIGMOIDOSCOPY N/A 07/01/2017   Procedure: FLEXIBLE SIGMOIDOSCOPY;  Surgeon: Jonathon Bellows, MD;  Location: [facility] ENDOSCOPY;  Service: Gastroenterology;  Laterality: N/A;  . KNEE SURGERY    . mini gastric bypass    . SHOULDER SURGERY    . TONSILLECTOMY     Social History   Social History Narrative  . Not on file    There is no immunization history on file for this patient.   Objective: Vital Signs: There were no vitals taken for this visit.  Physical Exam   Musculoskeletal Exam: ***  CDAI Exam: CDAI Score: -- Patient Global: --; Provider Global: -- Swollen: --; Tender: -- Joint Exam 08/15/2020   No joint exam has been documented for this visit   There is currently no information documented on the homunculus. Go to the Rheumatology activity and complete the homunculus joint exam.  Investigation: No  additional findings.  Imaging: No results found.  Recent Labs: Lab Results  Component Value Date   WBC 6.1 05/02/2020   HGB 13.3 05/02/2020   PLT 185 05/02/2020   NA 142 05/02/2020   K 4.4 05/02/2020   CL 110 05/02/2020   CO2 26 05/02/2020   GLUCOSE 101 (H) 05/02/2020   BUN 10 05/02/2020   CREATININE 0.58 05/02/2020   BILITOT 0.6 05/02/2020   ALKPHOS 46 05/02/2020   AST 28 05/02/2020   ALT 26 05/02/2020   PROT 6.1 (L) 05/02/2020   ALBUMIN 3.4 (L) 05/02/2020   CALCIUM 8.5 (L) 05/02/2020   GFRAA >60 05/02/2020    Speciality Comments: Osteoporosis followed by her endocrinologist Dr. Buddy Duty.  Procedures:  No procedures performed Allergies: Lac bovis, Prednisone, Sulfa antibiotics, Tape, and Zithromax [azithromycin]   Assessment / Plan:     Visit Diagnoses: No diagnosis found.  Orders: No orders of the defined types were placed in this encounter.  No orders of the defined types were placed in this encounter.   Face-to-face time spent with patient was *** minutes. Greater than 50% of time was spent in counseling and coordination of care.  Follow-Up Instructions: No follow-ups on file.   Earnestine Mealing, CMA  Note - This record has been created using Editor, commissioning.  Chart creation errors have been sought, but may not always  have been located. Such creation errors do not reflect on  the standard of medical care.

## 2020-08-06 ENCOUNTER — Encounter (HOSPITAL_COMMUNITY): Payer: Medicare Other

## 2020-08-15 ENCOUNTER — Ambulatory Visit: Payer: Medicare Other | Admitting: Rheumatology

## 2020-08-15 DIAGNOSIS — Z8781 Personal history of (healed) traumatic fracture: Secondary | ICD-10-CM

## 2020-08-15 DIAGNOSIS — N2581 Secondary hyperparathyroidism of renal origin: Secondary | ICD-10-CM

## 2020-08-15 DIAGNOSIS — D500 Iron deficiency anemia secondary to blood loss (chronic): Secondary | ICD-10-CM

## 2020-08-15 DIAGNOSIS — K219 Gastro-esophageal reflux disease without esophagitis: Secondary | ICD-10-CM

## 2020-08-15 DIAGNOSIS — Z8739 Personal history of other diseases of the musculoskeletal system and connective tissue: Secondary | ICD-10-CM

## 2020-08-15 DIAGNOSIS — M19012 Primary osteoarthritis, left shoulder: Secondary | ICD-10-CM

## 2020-08-15 DIAGNOSIS — E538 Deficiency of other specified B group vitamins: Secondary | ICD-10-CM

## 2020-08-15 DIAGNOSIS — M1811 Unilateral primary osteoarthritis of first carpometacarpal joint, right hand: Secondary | ICD-10-CM

## 2020-08-15 DIAGNOSIS — Z8709 Personal history of other diseases of the respiratory system: Secondary | ICD-10-CM

## 2020-08-15 DIAGNOSIS — M5136 Other intervertebral disc degeneration, lumbar region: Secondary | ICD-10-CM

## 2020-08-15 DIAGNOSIS — M19041 Primary osteoarthritis, right hand: Secondary | ICD-10-CM

## 2020-08-15 DIAGNOSIS — M79641 Pain in right hand: Secondary | ICD-10-CM

## 2020-08-15 DIAGNOSIS — Z9884 Bariatric surgery status: Secondary | ICD-10-CM

## 2020-08-15 DIAGNOSIS — E559 Vitamin D deficiency, unspecified: Secondary | ICD-10-CM

## 2020-08-15 DIAGNOSIS — M720 Palmar fascial fibromatosis [Dupuytren]: Secondary | ICD-10-CM

## 2020-10-01 NOTE — Progress Notes (Signed)
Office Visit Note  Patient: [name]             Date of Birth: 04-03-1951           MRN: [id number]             PCP: Mar Daring, PA-C Referring: Florian Buff* Visit Date: 10/15/2020 Occupation: @GUAROCC @  Subjective:  Pain in hands.   History of Present Illness: [name] is a 70 y.o. female with history of osteoarthritis, degenerative disc disease and osteoporosis.  She states she has had 2 falls since her last visit.  The last fall was in November 2021.  She states she had very good response to the right CMC prolotherapy injection.  Now the symptoms have recurred.  She was also referred to Dr. Burney Gauze for evaluation of her right [facility]  She states Dr. Burney Gauze suggested observation at this point and gave her cortisone injection which has helped to some extent.  Although she continues to have a lot of discomfort in her right CMC joint.  She states her grip strength has decreased and she is unable to hold objects.  After this fall her left shoulder joint started hurting as well.  She has been seeing Dr. Noemi Chapel for that.  She is also going for physical therapy.  Has noticed improvement with physical therapy.  She relates her falls to dizziness.  She states recently she has been feeling dizzy which happens when she rotates her head.  She states several years ago she had surgery on her left ear at [facility].  She believes her symptoms are coming back.  She has been getting Prolia injections through Dr. Buddy Duty.    Activities of Daily Living:  Patient reports morning stiffness for 1 hour.   Patient Reports nocturnal pain.  Difficulty dressing/grooming: Denies Difficulty climbing stairs: Reports Difficulty getting out of chair: Reports Difficulty using hands for taps, buttons, cutlery, and/or writing: Reports  Review of Systems  Constitutional: Positive for fatigue.  HENT: Positive for mouth dryness. Negative for mouth sores and nose dryness.   Eyes: Negative  for pain, itching and dryness.  Respiratory: Negative for shortness of breath and difficulty breathing.   Cardiovascular: Negative for chest pain and palpitations.  Gastrointestinal: Negative for blood in stool, constipation and diarrhea.  Endocrine: Negative for increased urination.  Genitourinary: Negative for difficulty urinating.  Musculoskeletal: Positive for arthralgias, joint pain, joint swelling, myalgias, morning stiffness, muscle tenderness and myalgias.  Skin: Negative for color change, rash and redness.  Allergic/Immunologic: Negative for susceptible to infections.  Neurological: Positive for dizziness. Negative for numbness, headaches, memory loss and weakness.  Hematological: Positive for bruising/bleeding tendency.  Psychiatric/Behavioral: Negative for confusion and sleep disturbance.    PMFS History:  Patient Active Problem List   Diagnosis Date Noted  . Age-related osteoporosis without current pathological fracture 10/30/2019  . Left-sided weakness   . Stroke-like symptoms 02/24/2018  . HTN (hypertension) 02/24/2018  . Primary osteoarthritis of both hands 11/03/2017  . Dupuytren's contracture of left hand 11/03/2017  . DDD (degenerative disc disease), lumbar 11/03/2017  . History of gastric bypass 11/03/2017  . Lymphedema 09/17/2017  . History of vertebral fracture 09/17/2017  . History of osteoporosis 09/17/2017  . Iron deficiency anemia due to chronic blood loss 06/28/2017  . Lower GI bleed 05/31/2017  . Rectal mass 05/31/2017  . Asthma 05/31/2017  . GERD (gastroesophageal reflux disease) 05/31/2017  . Closed fracture of upper end of humerus 09/23/2016  . Tendinitis  09/23/2016  . Patulous eustachian tube of right ear 11/07/2015  . 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 Vitamin D deficiency 08/20/2014  . Secondary hyperparathyroidism ([place]) 08/20/2014    Past Medical History:  Diagnosis Date  . Asthma   . GERD (gastroesophageal  reflux disease)   . Internal hemorrhoids   . Iron deficiency anemia   . Osteoporosis   . Peptic ulcer   . Tubular adenoma of colon     Family History  Problem Relation Age of Onset  . Hypertension Other   . Stroke Other   . Diabetes Other   . Heart attack Other   . Obesity Other   . Diabetes Father   . Heart disease Father   . Colon cancer Father   . Breast cancer Paternal Grandmother   . Healthy Daughter   . Stomach cancer Neg Hx   . Pancreatic cancer Neg Hx    Past Surgical History:  Procedure Laterality Date  . BACK SURGERY    . BREAST SURGERY    . CHOLECYSTECTOMY    . COLONOSCOPY WITH PROPOFOL N/A 06/02/2017   Procedure: COLONOSCOPY WITH PROPOFOL;  Surgeon: Jonathon Bellows, MD;  Location: [facility] ENDOSCOPY;  Service: Gastroenterology;  Laterality: N/A;  . ESOPHAGOGASTRODUODENOSCOPY (EGD) WITH PROPOFOL N/A 07/01/2017   Procedure: ESOPHAGOGASTRODUODENOSCOPY (EGD) WITH PROPOFOL;  Surgeon: Jonathon Bellows, MD;  Location: [facility] ENDOSCOPY;  Service: Gastroenterology;  Laterality: N/A;  . FLEXIBLE SIGMOIDOSCOPY N/A 07/01/2017   Procedure: FLEXIBLE SIGMOIDOSCOPY;  Surgeon: Jonathon Bellows, MD;  Location: [facility] ENDOSCOPY;  Service: Gastroenterology;  Laterality: N/A;  . KNEE SURGERY    . mini gastric bypass    . SHOULDER SURGERY    . TONSILLECTOMY     Social History   Social History Narrative  . Not on file    There is no immunization history on file for this patient.   Objective: Vital Signs: BP 115/81 (BP Location: Left Arm, Patient Position: Sitting, Cuff Size: Normal)   Pulse 66   Resp 16   Ht 5\' 4"  (1.626 m)   Wt 185 lb (83.9 kg)   BMI 31.76 kg/m    Physical Exam Vitals and nursing note reviewed.  Constitutional:      Appearance: She is well-developed and well-nourished.  HENT:     Head: Normocephalic and atraumatic.  Eyes:     Extraocular Movements: EOM normal.     Conjunctiva/sclera: Conjunctivae normal.  Cardiovascular:     Rate and Rhythm: Normal rate and regular  rhythm.     Pulses: Intact distal pulses.     Heart sounds: Normal heart sounds.  Pulmonary:     Effort: Pulmonary effort is normal.     Breath sounds: Normal breath sounds.  Abdominal:     General: Bowel sounds are normal.     Palpations: Abdomen is soft.  Musculoskeletal:     Cervical back: Normal range of motion.  Lymphadenopathy:     Cervical: No cervical adenopathy.  Skin:    General: Skin is warm and dry.     Capillary Refill: Capillary refill takes less than 2 seconds.  Neurological:     Mental Status: She is alert and oriented to person, place, and time.  Psychiatric:        Mood and Affect: Mood and affect normal.        Behavior: Behavior normal.      Musculoskeletal Exam: C-spine was in good range of motion.  She had discomfort range of motion for lumbar spine.  Shoulder  joints and elbow joints with good range of motion.  She had PIP and DIP thickening.  She had bilateral CMC prominence.  She had contracture of her left middle and ring finger flexor tendons with incomplete extension.  Hip joints and knee joints with good range of motion.  She had no tenderness over ankles or MTPs.  CDAI Exam: CDAI Score: -- Patient Global: --; Provider Global: -- Swollen: --; Tender: -- Joint Exam 10/15/2020   No joint exam has been documented for this visit   There is currently no information documented on the homunculus. Go to the Rheumatology activity and complete the homunculus joint exam.  Investigation: No additional findings.  Imaging: No results found.  Recent Labs: Lab Results  Component Value Date   WBC 6.1 05/02/2020   HGB 13.3 05/02/2020   PLT 185 05/02/2020   NA 142 05/02/2020   K 4.4 05/02/2020   CL 110 05/02/2020   CO2 26 05/02/2020   GLUCOSE 101 (H) 05/02/2020   BUN 10 05/02/2020   CREATININE 0.58 05/02/2020   BILITOT 0.6 05/02/2020   ALKPHOS 46 05/02/2020   AST 28 05/02/2020   ALT 26 05/02/2020   PROT 6.1 (L) 05/02/2020   ALBUMIN 3.4 (L)  05/02/2020   CALCIUM 8.5 (L) 05/02/2020   GFRAA >60 05/02/2020    Speciality Comments: Osteoporosis followed by her endocrinologist Dr. Buddy Duty.  Procedures:  No procedures performed Allergies: Lac bovis, Prednisone, Sulfa antibiotics, Tape, and Zithromax [azithromycin]   Assessment / Plan:     Visit Diagnoses: Arthritis of carpometacarpal (CMC) joint of right thumb-patient states she had good response to prolotherapy.  She had a fall and after that the symptoms flared.  She has had cortisone injections by Dr. Burney Gauze which helped.  She will be seeing Dr. Burney Gauze after using the brace.  Primary osteoarthritis of both hands-joint protection and muscle strengthening was discussed.  Dupuytren's contracture of left hand-I have advised her to discuss this with Dr. Burney Gauze at the follow-up visit.  Primary osteoarthritis of left shoulder- she has been experiencing increased pain since she had the fall.  She has been seeing Dr. Noemi Chapel.  DDD (degenerative disc disease), lumbar-she has chronic pain.  Dizziness-she has been experiencing increased dizziness.  She states she had ear surgery in the past at [facility].  She would like to see an ENT surgeon locally.  She states rotating her head makes her dizzy.  She believes her falls were related to dizziness.  Frequent falls-she has had 2 falls since her last visit.  Last fall was in November.  I have advised her to get a walker.  A prescription was given.  History of osteoporosis - prolia 60 mg sq injections every 6 months. She was previously on Forteo.  She is followed by her endocrinologist Dr. Buddy Duty.   History of vertebral fracture   Other medical problems are listed as follows:  Secondary hyperparathyroidism ([place])  History of gastric bypass - 2003.  Vitamin D deficiency  B12 deficiency  Iron deficiency anemia due to chronic blood loss  Gastroesophageal reflux disease, unspecified whether esophagitis present  History of  asthma  Orders: Orders Placed This Encounter  Procedures  . Ambulatory referral to ENT   No orders of the defined types were placed in this encounter.    Follow-Up Instructions: Return in about 1 year (around 10/15/2021) for Osteoarthritis.   Bo Merino, MD  Note - This record has been created using Editor, commissioning.  Chart creation errors have been sought, but  may not always  have been located. Such creation errors do not reflect on  the standard of medical care.

## 2020-10-15 ENCOUNTER — Ambulatory Visit (INDEPENDENT_AMBULATORY_CARE_PROVIDER_SITE_OTHER): Payer: Medicare Other | Admitting: Rheumatology

## 2020-10-15 ENCOUNTER — Encounter: Payer: Self-pay | Admitting: Rheumatology

## 2020-10-15 ENCOUNTER — Other Ambulatory Visit: Payer: Self-pay

## 2020-10-15 VITALS — BP 115/81 | HR 66 | Resp 16 | Ht 64.0 in | Wt 185.0 lb

## 2020-10-15 DIAGNOSIS — M19012 Primary osteoarthritis, left shoulder: Secondary | ICD-10-CM

## 2020-10-15 DIAGNOSIS — M79642 Pain in left hand: Secondary | ICD-10-CM

## 2020-10-15 DIAGNOSIS — Z8709 Personal history of other diseases of the respiratory system: Secondary | ICD-10-CM

## 2020-10-15 DIAGNOSIS — M720 Palmar fascial fibromatosis [Dupuytren]: Secondary | ICD-10-CM

## 2020-10-15 DIAGNOSIS — M19042 Primary osteoarthritis, left hand: Secondary | ICD-10-CM

## 2020-10-15 DIAGNOSIS — K219 Gastro-esophageal reflux disease without esophagitis: Secondary | ICD-10-CM

## 2020-10-15 DIAGNOSIS — R42 Dizziness and giddiness: Secondary | ICD-10-CM

## 2020-10-15 DIAGNOSIS — R296 Repeated falls: Secondary | ICD-10-CM

## 2020-10-15 DIAGNOSIS — M1811 Unilateral primary osteoarthritis of first carpometacarpal joint, right hand: Secondary | ICD-10-CM

## 2020-10-15 DIAGNOSIS — E538 Deficiency of other specified B group vitamins: Secondary | ICD-10-CM

## 2020-10-15 DIAGNOSIS — M19041 Primary osteoarthritis, right hand: Secondary | ICD-10-CM | POA: Diagnosis not present

## 2020-10-15 DIAGNOSIS — Z8739 Personal history of other diseases of the musculoskeletal system and connective tissue: Secondary | ICD-10-CM

## 2020-10-15 DIAGNOSIS — Z8781 Personal history of (healed) traumatic fracture: Secondary | ICD-10-CM

## 2020-10-15 DIAGNOSIS — M5136 Other intervertebral disc degeneration, lumbar region: Secondary | ICD-10-CM

## 2020-10-15 DIAGNOSIS — D500 Iron deficiency anemia secondary to blood loss (chronic): Secondary | ICD-10-CM

## 2020-10-15 DIAGNOSIS — Z9884 Bariatric surgery status: Secondary | ICD-10-CM

## 2020-10-15 DIAGNOSIS — N2581 Secondary hyperparathyroidism of renal origin: Secondary | ICD-10-CM

## 2020-10-15 DIAGNOSIS — E559 Vitamin D deficiency, unspecified: Secondary | ICD-10-CM

## 2020-11-14 ENCOUNTER — Other Ambulatory Visit: Payer: Self-pay

## 2020-11-14 ENCOUNTER — Inpatient Hospital Stay (HOSPITAL_COMMUNITY): Payer: Medicare Other | Attending: Hematology

## 2020-11-14 DIAGNOSIS — Z9884 Bariatric surgery status: Secondary | ICD-10-CM | POA: Diagnosis not present

## 2020-11-14 DIAGNOSIS — D509 Iron deficiency anemia, unspecified: Secondary | ICD-10-CM | POA: Diagnosis not present

## 2020-11-14 DIAGNOSIS — E876 Hypokalemia: Secondary | ICD-10-CM | POA: Diagnosis not present

## 2020-11-14 DIAGNOSIS — D500 Iron deficiency anemia secondary to blood loss (chronic): Secondary | ICD-10-CM

## 2020-11-14 LAB — CBC WITH DIFFERENTIAL/PLATELET
Abs Immature Granulocytes: 0.02 10*3/uL (ref 0.00–0.07)
Basophils Absolute: 0 10*3/uL (ref 0.0–0.1)
Basophils Relative: 0 %
Eosinophils Absolute: 0.2 10*3/uL (ref 0.0–0.5)
Eosinophils Relative: 2 %
HCT: 40.8 % (ref 36.0–46.0)
Hemoglobin: 13.1 g/dL (ref 12.0–15.0)
Immature Granulocytes: 0 %
Lymphocytes Relative: 12 %
Lymphs Abs: 0.8 10*3/uL (ref 0.7–4.0)
MCH: 31.3 pg (ref 26.0–34.0)
MCHC: 32.1 g/dL (ref 30.0–36.0)
MCV: 97.6 fL (ref 80.0–100.0)
Monocytes Absolute: 0.5 10*3/uL (ref 0.1–1.0)
Monocytes Relative: 8 %
Neutro Abs: 5.3 10*3/uL (ref 1.7–7.7)
Neutrophils Relative %: 78 %
Platelets: 186 10*3/uL (ref 150–400)
RBC: 4.18 MIL/uL (ref 3.87–5.11)
RDW: 12.9 % (ref 11.5–15.5)
WBC: 6.9 10*3/uL (ref 4.0–10.5)
nRBC: 0 % (ref 0.0–0.2)

## 2020-11-14 LAB — COMPREHENSIVE METABOLIC PANEL
ALT: 21 U/L (ref 0–44)
AST: 19 U/L (ref 15–41)
Albumin: 3.2 g/dL — ABNORMAL LOW (ref 3.5–5.0)
Alkaline Phosphatase: 58 U/L (ref 38–126)
Anion gap: 9 (ref 5–15)
BUN: 9 mg/dL (ref 8–23)
CO2: 24 mmol/L (ref 22–32)
Calcium: 7.6 mg/dL — ABNORMAL LOW (ref 8.9–10.3)
Chloride: 106 mmol/L (ref 98–111)
Creatinine, Ser: 0.59 mg/dL (ref 0.44–1.00)
GFR, Estimated: >60 mL/min (ref >60)
Glucose, Bld: 129 mg/dL — ABNORMAL HIGH (ref 70–99)
Potassium: 3.2 mmol/L — ABNORMAL LOW (ref 3.5–5.1)
Sodium: 139 mmol/L (ref 135–145)
Total Bilirubin: 0.9 mg/dL (ref 0.3–1.2)
Total Protein: 5.6 g/dL — ABNORMAL LOW (ref 6.5–8.1)

## 2020-11-14 LAB — VITAMIN B12: Vitamin B-12: 959 pg/mL — ABNORMAL HIGH (ref 180–914)

## 2020-11-14 LAB — IRON AND TIBC
Iron: 49 ug/dL (ref 28–170)
Saturation Ratios: 19 % (ref 10.4–31.8)
TIBC: 253 ug/dL (ref 250–450)
UIBC: 204 ug/dL

## 2020-11-14 LAB — FERRITIN: Ferritin: 167 ng/mL (ref 11–307)

## 2020-11-14 LAB — FOLATE: Folate: 12.6 ng/mL (ref >5.9)

## 2020-11-21 ENCOUNTER — Other Ambulatory Visit: Payer: Self-pay

## 2020-11-21 ENCOUNTER — Inpatient Hospital Stay (HOSPITAL_BASED_OUTPATIENT_CLINIC_OR_DEPARTMENT_OTHER): Payer: Medicare Other | Admitting: Oncology

## 2020-11-21 DIAGNOSIS — D500 Iron deficiency anemia secondary to blood loss (chronic): Secondary | ICD-10-CM | POA: Diagnosis not present

## 2020-11-21 MED ORDER — POTASSIUM CHLORIDE CRYS ER 20 MEQ PO TBCR: 20.0000 meq | EXTENDED_RELEASE_TABLET | Freq: Two times a day (BID) | ORAL | 0 refills | Status: DC

## 2020-11-21 NOTE — Progress Notes (Signed)
[place], [place] [address]   CLINIC:  Medical Oncology/Hematology  PCP:  [place] [address] / [place] [id number]  [phone]  REASON FOR VISIT:  Follow-up for iron deficiency anemia  PRIOR THERAPY: None  CURRENT THERAPY: Intermittent Feraheme  INTERVAL HISTORY:  [name], a 70 y.o. female, returns for routine follow-up for her iron deficiency anemia. [name] was last seen in clinic on 05/09/2020.  Reports several falls since her last visit. She tripped over a bathmat and fell hitting her head and became dizzy while turning around in her closet both resulting in a fall.Marland Kitchen She was not evaluated in ED or urgent care.  States she was evaluated by Dr. Para March.  She also has had new nausea with intermittent vomiting mainly with meals.  She has been seen by GI in the past but most recent doctor has retired.  She reports very little appetite.  She sees rheumatology, Dr. Buddy Duty for osteopenia.  She receives Prolia injections.  Last Prolia was 3 weeks ago.  Reports taking calcium plus vitamin D (600 mg/20 mcg) tablets along with vitamin D3 (5000).  She also is taking Calcitrol.  She states today she feels okay.  She is curious about her lab work to see if possibly this could be contributing to all of her new symptoms she is having.  She denies any bleeding, night sweats, fevers, chills or weight loss.  REVIEW OF SYSTEMS:  Review of Systems  Constitutional: Positive for fatigue (mild). Negative for appetite change, chills, diaphoresis, fever and unexpected weight change.  Gastrointestinal: Negative for blood in stool.  Musculoskeletal:       Falls  Neurological: Positive for dizziness.  All other systems reviewed and are negative.   PAST MEDICAL/SURGICAL HISTORY:  Past Medical History:  Diagnosis Date   Asthma    GERD (gastroesophageal reflux disease)    Internal hemorrhoids    Iron  deficiency anemia    Osteoporosis    Peptic ulcer    Tubular adenoma of colon    Past Surgical History:  Procedure Laterality Date   BACK SURGERY     BREAST SURGERY     CHOLECYSTECTOMY     COLONOSCOPY WITH PROPOFOL N/A 06/02/2017   Procedure: COLONOSCOPY WITH PROPOFOL;  Surgeon: Jonathon Bellows, MD;  Location: [facility] ENDOSCOPY;  Service: Gastroenterology;  Laterality: N/A;   ESOPHAGOGASTRODUODENOSCOPY (EGD) WITH PROPOFOL N/A 07/01/2017   Procedure: ESOPHAGOGASTRODUODENOSCOPY (EGD) WITH PROPOFOL;  Surgeon: Jonathon Bellows, MD;  Location: [facility] ENDOSCOPY;  Service: Gastroenterology;  Laterality: N/A;   FLEXIBLE SIGMOIDOSCOPY N/A 07/01/2017   Procedure: FLEXIBLE SIGMOIDOSCOPY;  Surgeon: Jonathon Bellows, MD;  Location: [facility] ENDOSCOPY;  Service: Gastroenterology;  Laterality: N/A;   KNEE SURGERY     mini gastric bypass     SHOULDER SURGERY     TONSILLECTOMY      SOCIAL HISTORY:  Social History   Socioeconomic History   Marital status: Married    Spouse name: Not on file   Number of children: Not on file   Years of education: Not on file   Highest education level: Not on file  Occupational History   Not on file  Tobacco Use   Smoking status: Never Smoker   Smokeless tobacco: Never Used  Vaping Use   Vaping Use: Never used  Substance and Sexual Activity   Alcohol use: No    Alcohol/week: 0.0 standard drinks   Drug use: Never   Sexual activity:  Never  Other Topics Concern   Not on file  Social History Narrative   Not on file   Social Determinants of Health   Financial Resource Strain: Not on file  Food Insecurity: Not on file  Transportation Needs: Not on file  Physical Activity: Not on file  Stress: Not on file  Social Connections: Not on file  Intimate Partner Violence: Not on file    FAMILY HISTORY:  Family History  Problem Relation Age of Onset   Hypertension Other    Stroke Other    Diabetes Other    Heart attack Other    Obesity Other     Diabetes Father    Heart disease Father    Colon cancer Father    Breast cancer Paternal Grandmother    Healthy Daughter    Stomach cancer Neg Hx    Pancreatic cancer Neg Hx     CURRENT MEDICATIONS:  Current Outpatient Medications  Medication Sig Dispense Refill   potassium chloride SA (KLOR-CON) 20 MEQ tablet Take 1 tablet (20 mEq total) by mouth 2 (two) times daily. 10 tablet 0   calcitRIOL (ROCALTROL) 0.5 MCG capsule Take 0.5 mcg by mouth daily.  5   Cholecalciferol (VITAMIN D3) 5000 UNITS CAPS Take 1 capsule by mouth daily.      denosumab (PROLIA) 60 MG/ML SOSY injection Inject 60 mg into the skin every 6 (six) months.     diphenhydrAMINE (BENADRYL) 25 MG tablet Take 25 mg by mouth at bedtime as needed.      Melatonin 5 MG CAPS Take by mouth as needed.      Multiple Vitamin (MULTIVITAMIN) tablet Take 1 tablet by mouth 2 (two) times daily.      VITAMIN A PO Take 1 capsule by mouth daily.      VITAMIN E PO Take 1 capsule by mouth daily.      No current facility-administered medications for this visit.    ALLERGIES:  Allergies  Allergen Reactions   Lac Bovis Diarrhea   Prednisone Diarrhea   Sulfa Antibiotics Itching   Tape Dermatitis   Zithromax [Azithromycin] Diarrhea    PHYSICAL EXAM:  Performance status (ECOG): 1 - Symptomatic but completely ambulatory  There were no vitals filed for this visit. Wt Readings from Last 3 Encounters:  10/15/20 185 lb (83.9 kg)  05/09/20 191 lb (86.6 kg)  11/01/19 190 lb (86.2 kg)   Physical Exam Neurological:     Mental Status: She is alert and oriented to person, place, and time.     LABORATORY DATA:  I have reviewed the labs as listed.  CBC Latest Ref Rng & Units 11/14/2020 05/02/2020 09/05/2019  WBC 4.0 - 10.5 K/uL 6.9 6.1 4.5  Hemoglobin 12.0 - 15.0 g/dL 13.1 13.3 13.0  Hematocrit 36.0 - 46.0 % 40.8 42.3 41.0  Platelets 150 - 400 K/uL 186 185 176   CMP Latest Ref Rng & Units 11/14/2020 05/02/2020  09/05/2019  Glucose 70 - 99 mg/dL 129(H) 101(H) 98  BUN 8 - 23 mg/dL 9 10 10   Creatinine 0.44 - 1.00 mg/dL 0.59 0.58 0.50  Sodium 135 - 145 mmol/L 139 142 140  Potassium 3.5 - 5.1 mmol/L 3.2(L) 4.4 3.8  Chloride 98 - 111 mmol/L 106 110 104  CO2 22 - 32 mmol/L 24 26 28   Calcium 8.9 - 10.3 mg/dL 7.6(L) 8.5(L) 8.0(L)  Total Protein 6.5 - 8.1 g/dL 5.6(L) 6.1(L) 6.0(L)  Total Bilirubin 0.3 - 1.2 mg/dL 0.9 0.6 0.8  Alkaline Phos 38 -  126 U/L 58 46 44  AST 15 - 41 U/L 19 28 58(H)  ALT 0 - 44 U/L 21 26 41      Component Value Date/Time   RBC 4.18 11/14/2020 1251   MCV 97.6 11/14/2020 1251   MCV 96 09/25/2016 1518   MCH 31.3 11/14/2020 1251   MCHC 32.1 11/14/2020 1251   RDW 12.9 11/14/2020 1251   RDW 15.5 (H) 09/25/2016 1518   LYMPHSABS 0.8 11/14/2020 1251   LYMPHSABS 1.2 09/25/2016 1518   MONOABS 0.5 11/14/2020 1251   EOSABS 0.2 11/14/2020 1251   EOSABS 0.1 09/25/2016 1518   BASOSABS 0.0 11/14/2020 1251   BASOSABS 0.0 09/25/2016 1518   Lab Results  Component Value Date   TIBC 253 11/14/2020   TIBC 307 05/02/2020   TIBC 343 09/05/2019   FERRITIN 167 11/14/2020   FERRITIN 124 05/02/2020   FERRITIN 112 09/05/2019   IRONPCTSAT 19 11/14/2020   IRONPCTSAT 26 05/02/2020   IRONPCTSAT 21 09/05/2019    DIAGNOSTIC IMAGING:  I have independently reviewed the scans and discussed with the patient. No results found.   ASSESSMENT:  1.  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 -Colonoscopy in September 2018 did not show any major bleeding sources. -Feraheme on 09/29/2019.   PLAN:  1.  Iron deficiency anemia: -She reported that Feraheme gave her energy.   -She last received IV Feraheme on 09/29/2019. -Labs from 11/14/2020 show hemoglobin of 13.1, ferritin 167 and iron saturations of 19%. -She does not need any additional IV iron at this time.  2.  Hypokalemia: -Labs from 11/14/2020 show potassium level of 3.2.   -Reviewed medication list.  -We will start her on 20 mEq twice daily x5 days to correct her potassium.  3.  Recent falls: -Unclear etiology but suspect decline in appetite to be contributing. -Several of her labs today are low including her potassium and calcium levels. -Encouraged hydration -She has follow-up scheduled with neurology in [place].   4.  Nausea with intermittent vomiting: -Appears to be mainly with meals. -She was previously seen by GI but has not been seen in quite some time.  Previous GI doctor has retired.  -She was evalauted by Dr. Vicente Males over at [facility] when she was admitted for GI bleed.. -I have asked that she reach out to them for an appointment in the next couple weeks  5.  Hypocalcemia: -Labs from 11/14/2020 show a calcium level of 7.6. (corrected 8.2).  -I have asked her to increase her amount of calcium from 600 mg daily to 1200 mg daily along with her vitamin D.  Disposition: -RTC in 6 months with repeat lab work and MD assessment.   Orders placed this encounter:  No orders of the defined types were placed in this encounter.   Faythe Casa, NP 11/21/2020 1:33 PM [place] [phone]

## 2021-01-07 ENCOUNTER — Ambulatory Visit (INDEPENDENT_AMBULATORY_CARE_PROVIDER_SITE_OTHER): Payer: Medicare Other | Admitting: Otolaryngology

## 2021-01-08 ENCOUNTER — Ambulatory Visit (INDEPENDENT_AMBULATORY_CARE_PROVIDER_SITE_OTHER): Payer: Medicare Other | Admitting: Gastroenterology

## 2021-01-08 ENCOUNTER — Encounter: Payer: Self-pay | Admitting: Gastroenterology

## 2021-01-08 ENCOUNTER — Telehealth: Payer: Self-pay | Admitting: Gastroenterology

## 2021-01-08 ENCOUNTER — Other Ambulatory Visit: Payer: Self-pay

## 2021-01-08 VITALS — BP 105/74 | HR 66 | Ht 64.0 in | Wt 178.6 lb

## 2021-01-08 DIAGNOSIS — R112 Nausea with vomiting, unspecified: Secondary | ICD-10-CM

## 2021-01-08 DIAGNOSIS — Z1211 Encounter for screening for malignant neoplasm of colon: Secondary | ICD-10-CM

## 2021-01-08 DIAGNOSIS — K219 Gastro-esophageal reflux disease without esophagitis: Secondary | ICD-10-CM

## 2021-01-08 DIAGNOSIS — Z9884 Bariatric surgery status: Secondary | ICD-10-CM

## 2021-01-08 MED ORDER — NA SULFATE-K SULFATE-MG SULF 17.5-3.13-1.6 GM/177ML PO SOLN: 1.0000 | Freq: Once | ORAL | 0 refills | Status: AC

## 2021-01-08 MED ORDER — PANTOPRAZOLE SODIUM 40 MG PO TBEC: 40.0000 mg | DELAYED_RELEASE_TABLET | Freq: Every day | ORAL | 3 refills | Status: DC

## 2021-01-08 NOTE — Telephone Encounter (Signed)
Patient refused 6 week f/u at this time, she said she would talk to Dr Vicente Males at her procedure about a f/u visit. [place] be [place] the beach.

## 2021-01-08 NOTE — Progress Notes (Signed)
Jonathon Bellows MD, MRCP(U.K) [address]  [place]  [place], [place] [address]  Main: [phone]  Fax: [phone]   Gastroenterology Consultation  Referring Provider:     Florian Buff* Primary Care Physician:  Mar Daring, PA-C Primary Gastroenterologist:  Dr. Jonathon Bellows  Reason for Consultation:     Vomiting after eating        HPI:   [name] is a 70 y.o. y/o female referred for consultation & management  by  Mar Daring, PA-C.    Summary of history :  Had actually seen her in October 2018 for anemia.  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  In 2019 she was seen by [facility] and underwent hemorrhoidal banding   She is presently being sent to [place] for vomiting after eating.  Ongoing for the past few months.  Does complain of heartburn despite being on famotidine.  Previously was on pantoprazole.  He says it was stopped due to concern about long-term usage.  She states she has had an anastomotic ulcer many years back and she is concerned that the symptoms she has presently similar.  She also has abdominal discomfort after eating very similar to what she has had in the past.  Denies any melena.  Denies any NSAID use.  Seen by the cancer center for iron deficiency anemia in March 2022 hemoglobin 13.1 g has received IV iron.    Past Medical History:  Diagnosis Date  . Asthma   . GERD (gastroesophageal reflux disease)   . Internal hemorrhoids   . Iron deficiency anemia   . Osteoporosis   . Peptic ulcer   . Tubular adenoma of colon     Past Surgical History:  Procedure Laterality Date  . BACK SURGERY    . BREAST SURGERY    . CHOLECYSTECTOMY    . COLONOSCOPY WITH PROPOFOL N/A 06/02/2017   Procedure: COLONOSCOPY WITH PROPOFOL;  Surgeon: Jonathon Bellows, MD;   Location: [facility] ENDOSCOPY;  Service: Gastroenterology;  Laterality: N/A;  . ESOPHAGOGASTRODUODENOSCOPY (EGD) WITH PROPOFOL N/A 07/01/2017   Procedure: ESOPHAGOGASTRODUODENOSCOPY (EGD) WITH PROPOFOL;  Surgeon: Jonathon Bellows, MD;  Location: [facility] ENDOSCOPY;  Service: Gastroenterology;  Laterality: N/A;  . FLEXIBLE SIGMOIDOSCOPY N/A 07/01/2017   Procedure: FLEXIBLE SIGMOIDOSCOPY;  Surgeon: Jonathon Bellows, MD;  Location: [facility] ENDOSCOPY;  Service: Gastroenterology;  Laterality: N/A;  . KNEE SURGERY    . mini gastric bypass    . SHOULDER SURGERY    . TONSILLECTOMY      Prior to Admission medications   Medication Sig Start Date End Date Taking? Authorizing Provider  calcitRIOL (ROCALTROL) 0.5 MCG capsule Take 0.5 mcg by mouth daily. 08/30/16   [provider]  Cholecalciferol (VITAMIN D3) 5000 UNITS CAPS Take 1 capsule by mouth daily.     [provider]  denosumab (PROLIA) 60 MG/ML SOSY injection Inject 60 mg into the skin every 6 (six) months.    [provider]  diphenhydrAMINE (BENADRYL) 25 MG tablet Take 25 mg by mouth at bedtime as needed.     [provider]  Melatonin 5 MG CAPS Take by mouth as needed.     [provider]  Multiple Vitamin (MULTIVITAMIN) tablet Take 1 tablet by mouth 2 (two) times daily.     [provider]  potassium chloride SA (KLOR-CON) 20 MEQ tablet  Take 1 tablet (20 mEq total) by mouth 2 (two) times daily. 11/21/20   Jacquelin Hawking, NP  VITAMIN A PO Take 1 capsule by mouth daily.     [provider]  VITAMIN E PO Take 1 capsule by mouth daily.     [provider]    Family History  Problem Relation Age of Onset  . Hypertension Other   . Stroke Other   . Diabetes Other   . Heart attack Other   . Obesity Other   . Diabetes Father   . Heart disease Father   . Colon cancer Father   . Breast cancer Paternal Grandmother   . Healthy Daughter   . Stomach cancer Neg Hx   . Pancreatic cancer Neg Hx       Social History   Tobacco Use  . Smoking status: Never Smoker  . Smokeless tobacco: Never Used  Vaping Use  . Vaping Use: Never used  Substance Use Topics  . Alcohol use: No    Alcohol/week: 0.0 standard drinks  . Drug use: Never    Allergies as of 01/08/2021 - Review Complete 01/08/2021  Allergen Reaction Noted  . Lac bovis Diarrhea 08/15/2014  . Prednisone Diarrhea 08/15/2014  . Sulfa antibiotics Itching 08/15/2014  . Tape Dermatitis 02/24/2018  . Zithromax [azithromycin] Diarrhea 08/15/2014    Review of Systems:    All systems reviewed and negative except where noted in HPI.   Physical Exam:  BP 105/74 (BP Location: Left Arm, Patient Position: Sitting, Cuff Size: Large)   Pulse 66   Ht 5\' 4"  (1.626 m)   Wt 178 lb 9.6 oz (81 kg)   BMI 30.66 kg/m  No LMP recorded. Patient is postmenopausal. Psych:  Alert and cooperative. Normal mood and affect. General:   Alert,  Well-developed, well-nourished, pleasant and cooperative in NAD Head:  Normocephalic and atraumatic. Eyes:  Sclera clear, no icterus.   Conjunctiva pink. Ears:  Normal auditory acuity.. Lungs:  Respirations even and unlabored.  Clear throughout to auscultation.   No wheezes, crackles, or rhonchi. No acute distress. Heart:  Regular rate and rhythm; no murmurs, clicks, rubs, or gallops. Abdomen:  Normal bowel sounds.  No bruits.  Soft, non-tender and non-distended without masses, hepatosplenomegaly or hernias noted.  No guarding or rebound tenderness.    Neurologic:  Alert and oriented x3;  grossly normal neurologically. Psych:  Alert and cooperative. Normal mood and affect.  Imaging Studies: No results found.  Assessment and Plan:   [name] is a 70 y.o. y/o female with a past medical history of Roux-en-Y gastric bypass who I performed an endoscopy in 2019 which was normal.  Has had iron deficiency anemia in the past likely due to gastric bypass referred to me for vomiting after eating.  Last  colonoscopy in 2018 was less than adequate prep but no large lesions were seen.  May have a marginal/anastomotic ulcer  Plan 1.  Pantoprazole 40 mg once a day to empirically treat any reflux and anastomotic ulcers 2.  H. pylori breath test 3.  EGD to evaluate for vomiting to rule out anastomotic ulcer, colonoscopy to be performed for colon cancer screening as her last procedure did not have adequate prep. 4.  Prior history of iron deficiency anemia presently has normalized and follows with the cancer center where she has previously received IV iron.   I have discussed alternative options, risks & benefits,  which include, but are not limited to, bleeding, infection, perforation,respiratory  complication & drug reaction.  The patient agrees with this plan & written consent will be obtained.     Follow up in 8 weeks  Dr Jonathon Bellows MD,MRCP(U.K)

## 2021-01-09 LAB — H. PYLORI BREATH TEST: H pylori Breath Test: POSITIVE — AB

## 2021-01-10 ENCOUNTER — Telehealth: Payer: Self-pay

## 2021-01-10 DIAGNOSIS — K219 Gastro-esophageal reflux disease without esophagitis: Secondary | ICD-10-CM

## 2021-01-10 MED ORDER — BISMUTH SUBSALICYLATE 262 MG PO CHEW: 524.0000 mg | CHEWABLE_TABLET | Freq: Four times a day (QID) | ORAL | 0 refills | Status: AC

## 2021-01-10 MED ORDER — DOXYCYCLINE MONOHYDRATE 100 MG PO TABS: 100.0000 mg | ORAL_TABLET | Freq: Two times a day (BID) | ORAL | 0 refills | Status: AC

## 2021-01-10 MED ORDER — OMEPRAZOLE 20 MG PO CPDR: 20.0000 mg | DELAYED_RELEASE_CAPSULE | Freq: Two times a day (BID) | ORAL | 0 refills | Status: DC

## 2021-01-10 MED ORDER — METRONIDAZOLE 500 MG PO TABS: 500.0000 mg | ORAL_TABLET | Freq: Three times a day (TID) | ORAL | 0 refills | Status: AC

## 2021-01-10 NOTE — Telephone Encounter (Signed)
-----   Message from Jonathon Bellows, MD sent at 01/10/2021  9:30 AM EDT ----- This will help her with her digestive process and GI symptoms, no other options available to treat this condition and its important that we eradicate the bacteria as it can cause stomach cancer   ----- Message ----- From: Storm Frisk, CMA Sent: 01/10/2021   9:13 AM EDT To: Jonathon Bellows, MD  Patient states she has had several bi-pass (bariatric) surgeries and she can not absorb oral medications. She said, they do not agree with her small stomach and does not receive the nutrients. Please advise?   ----- Message ----- From: Jonathon Bellows, MD Sent: 01/10/2021   8:18 AM EDT To: Storm Frisk, CMA  Inform positive for H pylori   Bismuth subsalicylate (989 or 211 mg) 4 times daily , Prilosec 20 mg BID , Flagyl 500 mg TID, Doxycylcine 100 mg BID for 14 days and then test 6 weeks later for eradication with breath test

## 2021-01-10 NOTE — Telephone Encounter (Signed)
Patient has been informed of comments/recommendations from Dr. Vicente Males. Antibiotics will be sent to pharmacy and lab orders will be placed. Will send a my chart message explaining lab hours. Pt verbalized understanding.

## 2021-01-14 NOTE — Telephone Encounter (Signed)
error 

## 2021-01-22 ENCOUNTER — Telehealth: Payer: Self-pay

## 2021-01-22 NOTE — Telephone Encounter (Signed)
Called patient to inform her lab orders are in the system. Sent my chart message informing her of the lab hours on 04/29 . Test can not be performed after procedure. LVM explaining this.

## 2021-01-22 NOTE — Telephone Encounter (Signed)
She can do it after she returns , no rush anytime after 6 weeks but not before

## 2021-01-22 NOTE — Telephone Encounter (Signed)
Patient can't find the mychart message from 4/29. Wants the nurse to call her back

## 2021-01-22 NOTE — Telephone Encounter (Signed)
Patient wants to know if the doctor can re- test her for the H pylori bacteria when she comes in for the procedure on 01/28/21? Doesn't want to make two trip and wants reaasurance that it's gone. Please advise

## 2021-01-28 ENCOUNTER — Encounter: Payer: Self-pay | Admitting: Gastroenterology

## 2021-01-28 ENCOUNTER — Ambulatory Visit: Payer: Medicare Other | Admitting: Certified Registered Nurse Anesthetist

## 2021-01-28 ENCOUNTER — Ambulatory Visit
Admission: RE | Admit: 2021-01-28 | Discharge: 2021-01-28 | Disposition: A | Discharge status: Home / Self Care | Payer: Medicare Other | Source: Ambulatory Visit | Attending: Gastroenterology | Admitting: Gastroenterology

## 2021-01-28 ENCOUNTER — Encounter
Admission: RE | Disposition: A | Discharge status: Home / Self Care | Payer: Self-pay | Source: Ambulatory Visit | Attending: Gastroenterology

## 2021-01-28 DIAGNOSIS — Z1211 Encounter for screening for malignant neoplasm of colon: Secondary | ICD-10-CM

## 2021-01-28 DIAGNOSIS — R112 Nausea with vomiting, unspecified: Secondary | ICD-10-CM

## 2021-01-28 DIAGNOSIS — Z79899 Other long term (current) drug therapy: Secondary | ICD-10-CM | POA: Diagnosis not present

## 2021-01-28 DIAGNOSIS — D124 Benign neoplasm of descending colon: Secondary | ICD-10-CM | POA: Insufficient documentation

## 2021-01-28 DIAGNOSIS — D122 Benign neoplasm of ascending colon: Secondary | ICD-10-CM | POA: Insufficient documentation

## 2021-01-28 DIAGNOSIS — Z9884 Bariatric surgery status: Secondary | ICD-10-CM | POA: Insufficient documentation

## 2021-01-28 DIAGNOSIS — Z882 Allergy status to sulfonamides status: Secondary | ICD-10-CM | POA: Insufficient documentation

## 2021-01-28 DIAGNOSIS — Z91048 Other nonmedicinal substance allergy status: Secondary | ICD-10-CM | POA: Insufficient documentation

## 2021-01-28 DIAGNOSIS — K635 Polyp of colon: Secondary | ICD-10-CM

## 2021-01-28 HISTORY — PX: COLONOSCOPY WITH PROPOFOL: SHX5780

## 2021-01-28 HISTORY — PX: ESOPHAGOGASTRODUODENOSCOPY (EGD) WITH PROPOFOL: SHX5813

## 2021-01-28 HISTORY — DX: Dizziness and giddiness: R42

## 2021-01-28 SURGERY — COLONOSCOPY WITH PROPOFOL
Anesthesia: General

## 2021-01-28 MED ORDER — PHENYLEPHRINE HCL (PRESSORS) 10 MG/ML IV SOLN
INTRAVENOUS | Status: DC | PRN
  Administered 2021-01-28: 100 ug via INTRAVENOUS
  Administered 2021-01-28: 200 ug via INTRAVENOUS

## 2021-01-28 MED ORDER — PROPOFOL 10 MG/ML IV BOLUS
INTRAVENOUS | Status: DC | PRN
  Administered 2021-01-28: 80 mg via INTRAVENOUS

## 2021-01-28 MED ORDER — PROPOFOL 500 MG/50ML IV EMUL
INTRAVENOUS | Status: DC | PRN
  Administered 2021-01-28: 175 ug/kg/min via INTRAVENOUS

## 2021-01-28 MED ORDER — PROPOFOL 10 MG/ML IV BOLUS
INTRAVENOUS | Status: AC
  Filled 2021-01-28: qty 20

## 2021-01-28 MED ORDER — SODIUM CHLORIDE 0.9 % IV SOLN: INTRAVENOUS | Status: DC

## 2021-01-28 MED ORDER — LIDOCAINE HCL (PF) 2 % IJ SOLN
INTRAMUSCULAR | Status: AC
  Filled 2021-01-28: qty 2

## 2021-01-28 MED ORDER — LIDOCAINE HCL (CARDIAC) PF 100 MG/5ML IV SOSY
PREFILLED_SYRINGE | INTRAVENOUS | Status: DC | PRN
  Administered 2021-01-28: 40 mg via INTRAVENOUS

## 2021-01-28 MED ORDER — PROPOFOL 500 MG/50ML IV EMUL
INTRAVENOUS | Status: AC
  Filled 2021-01-28: qty 50

## 2021-01-28 NOTE — Transfer of Care (Signed)
Immediate Anesthesia Transfer of Care Note  Patient: [name]  Procedure(s) Performed: COLONOSCOPY WITH PROPOFOL (N/A ) ESOPHAGOGASTRODUODENOSCOPY (EGD) WITH PROPOFOL (N/A )  Patient Location: PACU  Anesthesia Type:General  Level of Consciousness: sedated  Airway & Oxygen Therapy: Patient Spontanous Breathing  Post-op Assessment: Report given to RN and Post -op Vital signs reviewed and stable  Post vital signs: Reviewed and stable  Last Vitals:  Vitals Value Taken Time  BP 105/68 01/28/21 1056  Temp 35.8 C 01/28/21 1056  Pulse 77 01/28/21 1056  Resp    SpO2 98 % 01/28/21 1056    Last Pain:  Vitals:   01/28/21 1056  TempSrc: Temporal  PainSc: Asleep         Complications: No complications documented.

## 2021-01-28 NOTE — Anesthesia Postprocedure Evaluation (Signed)
Anesthesia Post Note  Patient: [name]  Procedure(s) Performed: COLONOSCOPY WITH PROPOFOL (N/A ) ESOPHAGOGASTRODUODENOSCOPY (EGD) WITH PROPOFOL (N/A )  Patient location during evaluation: Endoscopy Anesthesia Type: General Level of consciousness: awake and alert Pain management: pain level controlled Vital Signs Assessment: post-procedure vital signs reviewed and stable Respiratory status: spontaneous breathing, nonlabored ventilation, respiratory function stable and patient connected to nasal cannula oxygen Cardiovascular status: blood pressure returned to baseline and stable Postop Assessment: no apparent nausea or vomiting Anesthetic complications: no   No complications documented.   Last Vitals:  Vitals:   01/28/21 1116 01/28/21 1126  BP: 114/75 118/76  Pulse: 81 73  Resp: 19 16  Temp:    SpO2: 100% 99%    Last Pain:  Vitals:   01/28/21 1126  TempSrc:   PainSc: 0-No pain                 Martha Clan

## 2021-01-28 NOTE — Op Note (Signed)
[facility] Gastroenterology Patient Name: [name] Procedure Date: 01/28/2021 10:14 AM MRN: [id number] Account #: [id number] Date of Birth: Aug 17, 1951 Admit Type: Outpatient Age: 70 Room: [facility] ENDO ROOM 2 Gender: Female Note Status: Finalized Procedure:             Colonoscopy Indications:           Screening for colorectal malignant neoplasm Providers:             Jonathon Bellows MD, MD Referring MD:          Mar Daring (Referring MD) Medicines:             Monitored Anesthesia Care Complications:         No immediate complications. Procedure:             Pre-Anesthesia Assessment:                        - Prior to the procedure, a History and Physical was                         performed, and patient medications, allergies and                         sensitivities were reviewed. The patient's tolerance                         of previous anesthesia was reviewed.                        - The risks and benefits of the procedure and the                         sedation options and risks were discussed with the                         patient. All questions were answered and informed                         consent was obtained.                        - ASA Grade Assessment: II - A patient with mild                         systemic disease.                        After obtaining informed consent, the colonoscope was                         passed under direct vision. Throughout the procedure,                         the patient's blood pressure, pulse, and oxygen                         saturations were monitored continuously. The                         Colonoscope was introduced through the anus and  advanced to the the cecum, identified by the                         appendiceal orifice. The colonoscopy was performed                         with ease. The patient tolerated the procedure well.                         The quality  of the bowel preparation was excellent. Findings:      The perianal and digital rectal examinations were normal.      Two sessile polyps were found in the descending colon and ascending       colon. The polyps were 10 to 12 mm in size. These polyps were removed       with a cold snare. Resection and retrieval were complete.      The exam was otherwise without abnormality on direct and retroflexion       views. Impression:            - Two 10 to 12 mm polyps in the descending colon and                         in the ascending colon, removed with a cold snare.                         Resected and retrieved.                        - The examination was otherwise normal on direct and                         retroflexion views. Recommendation:        - Discharge patient to home (with escort).                        - Resume previous diet.                        - Continue present medications.                        - Await pathology results.                        - Repeat colonoscopy in 3 years for surveillance. Procedure Code(s):     --- Professional ---                        [id number], Colonoscopy, flexible; with removal of                         tumor(s), polyp(s), or other lesion(s) by snare                         technique Diagnosis Code(s):     --- Professional ---                        Z12.11, Encounter for screening for malignant neoplasm  of colon                        K63.5, Polyp of colon CPT copyright 2019 American Medical Association. All rights reserved. The codes documented in this report are preliminary and upon coder review may  be revised to meet current compliance requirements. Jonathon Bellows, MD Jonathon Bellows MD, MD 01/28/2021 10:54:01 AM This report has been signed electronically. Number of Addenda: 0 Note Initiated On: 01/28/2021 10:14 AM Scope Withdrawal Time: 0 hours 21 minutes 23 seconds  Total Procedure Duration: 0 hours 25 minutes 6 seconds   Estimated Blood Loss:  Estimated blood loss: none.      [facility]

## 2021-01-28 NOTE — Anesthesia Preprocedure Evaluation (Signed)
Anesthesia Evaluation  Patient identified by MRN, date of birth, ID band Patient awake    Reviewed: Allergy & Precautions, H&P , NPO status , Patient's Chart, lab work & pertinent test results  History of Anesthesia Complications Negative for: history of anesthetic complications  Airway Mallampati: I  TM Distance: <3 FB Neck ROM: limited    Dental  (+) Poor Dentition, Chipped, Caps, Missing, Dental Advidsory Given   Pulmonary neg shortness of breath, asthma , neg recent URI,           Cardiovascular Exercise Tolerance: Good (-) hypertension(-) angina(-) Past MI and (-) DOE negative cardio ROS       Neuro/Psych negative neurological ROS  negative psych ROS   GI/Hepatic Neg liver ROS, PUD, GERD  Medicated and Controlled,  Endo/Other  negative endocrine ROS  Renal/GU negative Renal ROS  negative genitourinary   Musculoskeletal   Abdominal   Peds  Hematology negative hematology ROS (+)   Anesthesia Other Findings Past Medical History: No date: Asthma No date: GERD (gastroesophageal reflux disease) No date: Osteoporosis No date: Peptic ulcer  Past Surgical History: No date: BACK SURGERY No date: BREAST SURGERY No date: CHOLECYSTECTOMY 06/02/2017: COLONOSCOPY WITH PROPOFOL; N/A     Comment:  Procedure: COLONOSCOPY WITH PROPOFOL;  Surgeon: Jonathon Bellows, MD;  Location: [facility] ENDOSCOPY;  Service:               Gastroenterology;  Laterality: N/A; No date: KNEE SURGERY No date: mini gastric bypass No date: SHOULDER SURGERY No date: TONSILLECTOMY  BMI    Body Mass Index:  25.75 kg/m      Reproductive/Obstetrics negative OB ROS                             Anesthesia Physical  Anesthesia Plan  ASA: III  Anesthesia Plan: General   Post-op Pain Management:    Induction: Intravenous  PONV Risk Score and Plan: Propofol infusion and TIVA  Airway Management Planned:  Natural Airway and Nasal Cannula  Additional Equipment:   Intra-op Plan:   Post-operative Plan:   Informed Consent: I have reviewed the patients History and Physical, chart, labs and discussed the procedure including the risks, benefits and alternatives for the proposed anesthesia with the patient or authorized representative who has indicated his/her understanding and acceptance.     Dental Advisory Given  Plan Discussed with: Anesthesiologist, CRNA and Surgeon  Anesthesia Plan Comments: (Patient consented for risks of anesthesia including but not limited to:  - adverse reactions to medications - risk of intubation if required - damage to teeth, lips or other oral mucosa - sore throat or hoarseness - Damage to heart, brain, lungs or loss of life  Patient voiced understanding.)        Anesthesia Quick Evaluation

## 2021-01-28 NOTE — Anesthesia Procedure Notes (Signed)
Date/Time: 01/28/2021 10:15 AM Performed by: Johnna Acosta, CRNA Pre-anesthesia Checklist: Patient identified, Emergency Drugs available, Suction available, Patient being monitored and Timeout performed Patient Re-evaluated:Patient Re-evaluated prior to induction Oxygen Delivery Method: Nasal cannula Preoxygenation: Pre-oxygenation with 100% oxygen

## 2021-01-28 NOTE — Op Note (Signed)
[facility] Gastroenterology Patient Name: Brandie Martinique Procedure Date: 01/28/2021 10:14 AM MRN: [id number] Account #: [id number] Date of Birth: [birth year] Admit Type: Outpatient Age: 70 Room: [facility] ENDO ROOM 2 Gender: Female Note Status: Finalized Procedure:             Upper GI endoscopy Indications:           Nausea with vomiting Providers:             Jonathon Bellows MD, MD Referring MD:          Mar Daring (Referring MD) Medicines:             Monitored Anesthesia Care Complications:         No immediate complications. Procedure:             Pre-Anesthesia Assessment:                        - Prior to the procedure, a History and Physical was                         performed, and patient medications, allergies and                         sensitivities were reviewed. The patient's tolerance                         of previous anesthesia was reviewed.                        - The risks and benefits of the procedure and the                         sedation options and risks were discussed with the                         patient. All questions were answered and informed                         consent was obtained.                        - ASA Grade Assessment: II - A patient with mild                         systemic disease.                        After obtaining informed consent, the endoscope was                         passed under direct vision. Throughout the procedure,                         the patient's blood pressure, pulse, and oxygen                         saturations were monitored continuously. The Endoscope                         was introduced through the mouth, and  advanced to the                         jejunum. The upper GI endoscopy was accomplished with                         ease. The patient tolerated the procedure well. Findings:      The esophagus was normal.      Evidence of a Roux-en-Y gastrojejunostomy was found. The  gastrojejunal       anastomosis was characterized by healthy appearing mucosa. This was       traversed. The pouch-to-jejunum limb was characterized by healthy       appearing mucosa. The jejunojejunal anastomosis was characterized by       healthy appearing mucosa.      The cardia and gastric fundus were normal on retroflexion.      The examined jejunum was normal. Impression:            - Normal esophagus.                        - Roux-en-Y gastrojejunostomy with gastrojejunal                         anastomosis characterized by healthy appearing mucosa.                        - Normal examined jejunum.                        - No specimens collected. Recommendation:        - Perform a colonoscopy today. Procedure Code(s):     --- Professional ---                        [phone], Esophagogastroduodenoscopy, flexible,                         transoral; diagnostic, including collection of                         specimen(s) by brushing or washing, when performed                         (separate procedure) Diagnosis Code(s):     --- Professional ---                        Z98.0, Intestinal bypass and anastomosis status                        R11.2, Nausea with vomiting, unspecified CPT copyright 2019 American Medical Association. All rights reserved. The codes documented in this report are preliminary and upon coder review may  be revised to meet current compliance requirements. Jonathon Bellows, MD Jonathon Bellows MD, MD 01/28/2021 10:24:39 AM This report has been signed electronically. Number of Addenda: 0 Note Initiated On: 01/28/2021 10:14 AM Estimated Blood Loss:  Estimated blood loss: none.      [facility]

## 2021-01-28 NOTE — H&P (Signed)
Jonathon Bellows, MD [address], [place], [place], [place], [address] [address], [place], [place], [place], [address] Phone: [phone]  Fax: [phone]  Primary Care Physician:  Bonnita Hollow, MD   Pre-Procedure History & Physical: HPI:  [name] is a 70 y.o. female is here for an endoscopy and colonoscopy    Past Medical History:  Diagnosis Date  . Asthma   . GERD (gastroesophageal reflux disease)   . Internal hemorrhoids   . Iron deficiency anemia   . Osteoporosis   . Peptic ulcer   . Tubular adenoma of colon   . Vertigo     Past Surgical History:  Procedure Laterality Date  . BACK SURGERY    . BREAST SURGERY    . CHOLECYSTECTOMY    . COLONOSCOPY WITH PROPOFOL N/A 06/02/2017   Procedure: COLONOSCOPY WITH PROPOFOL;  Surgeon: Jonathon Bellows, MD;  Location: [facility] ENDOSCOPY;  Service: Gastroenterology;  Laterality: N/A;  . ESOPHAGOGASTRODUODENOSCOPY (EGD) WITH PROPOFOL N/A 07/01/2017   Procedure: ESOPHAGOGASTRODUODENOSCOPY (EGD) WITH PROPOFOL;  Surgeon: Jonathon Bellows, MD;  Location: [facility] ENDOSCOPY;  Service: Gastroenterology;  Laterality: N/A;  . FLEXIBLE SIGMOIDOSCOPY N/A 07/01/2017   Procedure: FLEXIBLE SIGMOIDOSCOPY;  Surgeon: Jonathon Bellows, MD;  Location: [facility] ENDOSCOPY;  Service: Gastroenterology;  Laterality: N/A;  . KNEE SURGERY    . mini gastric bypass    . SHOULDER SURGERY    . TONSILLECTOMY      Prior to Admission medications   Medication Sig Start Date End Date Taking? Authorizing Provider  calcitRIOL (ROCALTROL) 0.5 MCG capsule Take 0.5 mcg by mouth daily. 08/30/16  Yes [provider]  Cholecalciferol (VITAMIN D3) 5000 UNITS CAPS Take 1 capsule by mouth daily.    Yes [provider]  Multiple Vitamin (MULTIVITAMIN) tablet Take 1 tablet by mouth 2 (two) times daily.    Yes [provider]  VITAMIN A PO Take 1 capsule by mouth daily.    Yes [provider]  VITAMIN E PO Take 1 capsule by mouth daily.     Yes [provider]  denosumab (PROLIA) 60 MG/ML SOSY injection Inject 60 mg into the skin every 6 (six) months.    [provider]  diphenhydrAMINE (BENADRYL) 25 MG tablet Take 25 mg by mouth at bedtime as needed.  Patient not taking: Reported on 01/28/2021    [provider]  Melatonin 5 MG CAPS Take by mouth as needed.     [provider]  omeprazole (PRILOSEC) 20 MG capsule Take 1 capsule (20 mg total) by mouth 2 (two) times daily before a meal for 14 days. 01/10/21 01/24/21  Jonathon Bellows, MD  pantoprazole (PROTONIX) 40 MG tablet Take 1 tablet (40 mg total) by mouth daily. 01/08/21   Jonathon Bellows, MD  potassium chloride SA (KLOR-CON) 20 MEQ tablet Take 1 tablet (20 mEq total) by mouth 2 (two) times daily. Patient not taking: Reported on 01/28/2021 11/21/20   Jacquelin Hawking, NP    Allergies as of 01/08/2021 - Review Complete 01/08/2021  Allergen Reaction Noted  . Lac bovis Diarrhea 08/15/2014  . Prednisone Diarrhea 08/15/2014  . Sulfa antibiotics Itching 08/15/2014  . Tape Dermatitis 02/24/2018  . Zithromax [azithromycin] Diarrhea 08/15/2014    Family History  Problem Relation Age of Onset  . Hypertension Other   . Stroke Other   . Diabetes Other   . Heart attack Other   . Obesity Other   . Diabetes Father   . Heart disease Father   .  Colon cancer Father   . Breast cancer Paternal Grandmother   . Healthy Daughter   . Stomach cancer Neg Hx   . Pancreatic cancer Neg Hx     Social History   Socioeconomic History  . Marital status: Married    Spouse name: Not on file  . Number of children: Not on file  . Years of education: Not on file  . Highest education level: Not on file  Occupational History  . Not on file  Tobacco Use  . Smoking status: Never Smoker  . Smokeless tobacco: Never Used  Vaping Use  . Vaping Use: Never used  Substance and Sexual Activity  . Alcohol use: No    Alcohol/week: 0.0 standard drinks  . Drug use: Never  .  Sexual activity: Never  Other Topics Concern  . Not on file  Social History Narrative  . Not on file   Social Determinants of Health   Financial Resource Strain: Not on file  Food Insecurity: Not on file  Transportation Needs: Not on file  Physical Activity: Not on file  Stress: Not on file  Social Connections: Not on file  Intimate Partner Violence: Not on file    Review of Systems: See HPI, otherwise negative ROS  Physical Exam: BP 114/82   Pulse 84   Temp (!) 96.7 F (35.9 C) (Temporal)   Resp 14   Ht 5\' 4"  (1.626 m)   Wt 77.1 kg   SpO2 100%   BMI 29.18 kg/m  General:   Alert,  pleasant and cooperative in NAD Head:  Normocephalic and atraumatic. Neck:  Supple; no masses or thyromegaly. Lungs:  Clear throughout to auscultation, normal respiratory effort.    Heart:  +S1, +S2, Regular rate and rhythm, No edema. Abdomen:  Soft, nontender and nondistended. Normal bowel sounds, without guarding, and without rebound.   Neurologic:  Alert and  oriented x4;  grossly normal neurologically.  Impression/Plan: [name] is here for an endoscopy and colonoscopy  to be performed for  evaluation of nausea vomiting , colon cancer screening     Risks, benefits, limitations, and alternatives regarding endoscopy have been reviewed with the patient.  Questions have been answered.  All parties agreeable.   Jonathon Bellows, MD  01/28/2021, 10:05 AM

## 2021-01-29 ENCOUNTER — Encounter: Payer: Self-pay | Admitting: Gastroenterology

## 2021-01-29 LAB — SURGICAL PATHOLOGY

## 2021-02-03 ENCOUNTER — Other Ambulatory Visit (HOSPITAL_COMMUNITY): Payer: Self-pay

## 2021-02-03 DIAGNOSIS — D500 Iron deficiency anemia secondary to blood loss (chronic): Secondary | ICD-10-CM

## 2021-02-03 NOTE — Progress Notes (Signed)
Call received from patient requesting that her labs be rechecked as she has now developed fatigue. Labs ordered per Dr. Delton Coombes. Patient scheduled for labs on Wednesday 05/25 at 1050.

## 2021-02-04 ENCOUNTER — Other Ambulatory Visit: Payer: Self-pay

## 2021-02-04 ENCOUNTER — Inpatient Hospital Stay (HOSPITAL_COMMUNITY): Payer: Medicare Other | Attending: Hematology

## 2021-02-04 DIAGNOSIS — D509 Iron deficiency anemia, unspecified: Secondary | ICD-10-CM | POA: Diagnosis present

## 2021-02-04 DIAGNOSIS — D500 Iron deficiency anemia secondary to blood loss (chronic): Secondary | ICD-10-CM

## 2021-02-04 LAB — COMPREHENSIVE METABOLIC PANEL
ALT: 32 U/L (ref 0–44)
AST: 25 U/L (ref 15–41)
Albumin: 3.2 g/dL — ABNORMAL LOW (ref 3.5–5.0)
Alkaline Phosphatase: 76 U/L (ref 38–126)
Anion gap: 5 (ref 5–15)
BUN: 16 mg/dL (ref 8–23)
CO2: 25 mmol/L (ref 22–32)
Calcium: 8.3 mg/dL — ABNORMAL LOW (ref 8.9–10.3)
Chloride: 108 mmol/L (ref 98–111)
Creatinine, Ser: 0.62 mg/dL (ref 0.44–1.00)
GFR, Estimated: >60 mL/min (ref >60)
Glucose, Bld: 97 mg/dL (ref 70–99)
Potassium: 4.4 mmol/L (ref 3.5–5.1)
Sodium: 138 mmol/L (ref 135–145)
Total Bilirubin: 0.5 mg/dL (ref 0.3–1.2)
Total Protein: 5.9 g/dL — ABNORMAL LOW (ref 6.5–8.1)

## 2021-02-04 LAB — CBC WITH DIFFERENTIAL/PLATELET
Abs Immature Granulocytes: 0.03 10*3/uL (ref 0.00–0.07)
Basophils Absolute: 0 10*3/uL (ref 0.0–0.1)
Basophils Relative: 1 %
Eosinophils Absolute: 0.1 10*3/uL (ref 0.0–0.5)
Eosinophils Relative: 1 %
HCT: 42.6 % (ref 36.0–46.0)
Hemoglobin: 13.5 g/dL (ref 12.0–15.0)
Immature Granulocytes: 0 %
Lymphocytes Relative: 17 %
Lymphs Abs: 1.2 10*3/uL (ref 0.7–4.0)
MCH: 31.6 pg (ref 26.0–34.0)
MCHC: 31.7 g/dL (ref 30.0–36.0)
MCV: 99.8 fL (ref 80.0–100.0)
Monocytes Absolute: 0.6 10*3/uL (ref 0.1–1.0)
Monocytes Relative: 8 %
Neutro Abs: 5.4 10*3/uL (ref 1.7–7.7)
Neutrophils Relative %: 73 %
Platelets: 249 10*3/uL (ref 150–400)
RBC: 4.27 MIL/uL (ref 3.87–5.11)
RDW: 13.5 % (ref 11.5–15.5)
WBC: 7.3 10*3/uL (ref 4.0–10.5)
nRBC: 0 % (ref 0.0–0.2)

## 2021-02-04 LAB — IRON AND TIBC
Iron: 76 ug/dL (ref 28–170)
Saturation Ratios: 42 % — ABNORMAL HIGH (ref 10.4–31.8)
TIBC: 180 ug/dL — ABNORMAL LOW (ref 250–450)
UIBC: 104 ug/dL

## 2021-02-04 LAB — FERRITIN: Ferritin: 213 ng/mL (ref 11–307)

## 2021-02-04 LAB — VITAMIN B12: Vitamin B-12: 722 pg/mL (ref 180–914)

## 2021-02-04 LAB — FOLATE: Folate: 15.1 ng/mL (ref >5.9)

## 2021-02-05 ENCOUNTER — Other Ambulatory Visit (HOSPITAL_COMMUNITY): Payer: Medicare Other

## 2021-02-25 ENCOUNTER — Telehealth (INDEPENDENT_AMBULATORY_CARE_PROVIDER_SITE_OTHER): Payer: Medicare Other | Admitting: Gastroenterology

## 2021-02-25 DIAGNOSIS — Z8619 Personal history of other infectious and parasitic diseases: Secondary | ICD-10-CM | POA: Diagnosis not present

## 2021-02-25 DIAGNOSIS — A048 Other specified bacterial intestinal infections: Secondary | ICD-10-CM | POA: Diagnosis not present

## 2021-02-25 DIAGNOSIS — K219 Gastro-esophageal reflux disease without esophagitis: Secondary | ICD-10-CM

## 2021-02-25 NOTE — Progress Notes (Signed)
[name] , MD [address]  [address]  [place], [place] [address]  Main: [phone]  Fax: [phone]   Primary Care Physician: Bonnita Hollow, MD  Virtual Visit via Telephone Note  I connected with patient on 02/25/21 at  3:15 PM EDT by telephone and verified that I am speaking with the correct person using two identifiers.   I discussed the limitations, risks, security and privacy concerns of performing an evaluation and management service by telephone and the availability of in person appointments. I also discussed with the patient that there may be a patient responsible charge related to this service. The patient expressed understanding and agreed to proceed.  Location of Patient: Home Location of Provider: Home Persons involved: Patient and provider only   History of Present Illness:   Follow-up for vomiting and H. pylori   HPI: [name] is a 70 y.o. female    Summary of history :  Initially referred and seen on 01/08/2021 for vomiting after eating.  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  In 2019 she was seen by [facility] and underwent hemorrhoidal banding.  She has been having issues with vomiting for the past few months.Does complain of heartburn despite being on famotidine.  Previously was on pantoprazole.  He says it was stopped due to concern about long-term usage.  She states she has had an anastomotic ulcer many years back and she is concerned that the symptoms she has presently similar.  She also has abdominal discomfort after eating very similar to what she has had in the past.  Denies any melena.  Denies any NSAID use.  Seen by the cancer center for iron deficiency anemia in March 2022 hemoglobin 13.1 g has received IV iron.      Interval history   01/08/2021-02/25/2021   01/08/2021: Tested positive H. pylori.  Treated with quadruple based bismuth  regimen. 02/04/2021: Hemoglobin 13.5 g, ferritin 213, B12 normal, folate normal, CMP normal 01/28/2021: EGD: Features of gastric bypass seen otherwise procedure was normal.  Colonoscopy also performed on the same day.  2 sessile polyps 10 to 12 mm were resected colon there were tubular adenomas repeat colonoscopy in 3 years  Presently not throwing up .  Doing well after she completed treatment for H. pylori.  Presently on Protonix 40 mg a day.  No other symptoms.  Current Outpatient Medications  Medication Sig Dispense Refill   calcitRIOL (ROCALTROL) 0.5 MCG capsule Take 0.5 mcg by mouth daily.  5   Cholecalciferol (VITAMIN D3) 5000 UNITS CAPS Take 1 capsule by mouth daily.      denosumab (PROLIA) 60 MG/ML SOSY injection Inject 60 mg into the skin every 6 (six) months.     diphenhydrAMINE (BENADRYL) 25 MG tablet Take 25 mg by mouth at bedtime as needed.  (Patient not taking: Reported on 01/28/2021)     Melatonin 5 MG CAPS Take by mouth as needed.      Multiple Vitamin (MULTIVITAMIN) tablet Take 1 tablet by mouth 2 (two) times daily.      omeprazole (PRILOSEC) 20 MG capsule Take 1 capsule (20 mg total) by mouth 2 (two) times daily before a meal for 14 days. 28 capsule 0   pantoprazole (PROTONIX) 40 MG tablet Take 1 tablet (40 mg total) by mouth daily. 90 tablet 3   potassium chloride SA (KLOR-CON) 20 MEQ tablet Take 1 tablet (20 mEq total) by mouth 2 (two)  times daily. (Patient not taking: Reported on 01/28/2021) 10 tablet 0   VITAMIN A PO Take 1 capsule by mouth daily.      VITAMIN E PO Take 1 capsule by mouth daily.      No current facility-administered medications for this visit.    Allergies as of 02/25/2021 - Review Complete 01/28/2021  Allergen Reaction Noted   Lac bovis Diarrhea 08/15/2014   Prednisone Diarrhea 08/15/2014   Sulfa antibiotics Itching 08/15/2014   Tape Dermatitis 02/24/2018   Zithromax [azithromycin] Diarrhea 08/15/2014    Review of Systems:    All systems reviewed  and negative except where noted in HPI.   Observations/Objective:  Labs: CMP     Component Value Date/Time   NA 138 02/04/2021 1522   NA 143 09/25/2016 1518   K 4.4 02/04/2021 1522   CL 108 02/04/2021 1522   CO2 25 02/04/2021 1522   GLUCOSE 97 02/04/2021 1522   BUN 16 02/04/2021 1522   BUN 4 (L) 09/25/2016 1518   CREATININE 0.62 02/04/2021 1522   CALCIUM 8.3 (L) 02/04/2021 1522   PROT 5.9 (L) 02/04/2021 1522   PROT 4.5 (L) 09/25/2016 1518   ALBUMIN 3.2 (L) 02/04/2021 1522   ALBUMIN 2.3 (L) 09/25/2016 1518   AST 25 02/04/2021 1522   ALT 32 02/04/2021 1522   ALKPHOS 76 02/04/2021 1522   BILITOT 0.5 02/04/2021 1522   BILITOT 0.3 09/25/2016 1518   GFRNONAA >60 02/04/2021 1522   GFRAA >60 05/02/2020 1308   Lab Results  Component Value Date   WBC 7.3 02/04/2021   HGB 13.5 02/04/2021   HCT 42.6 02/04/2021   MCV 99.8 02/04/2021   PLT 249 02/04/2021    Imaging Studies: No results found.  Assessment and Plan:   [name] is a 70 y.o. y/o female with a past medical history of Roux-en-Y gastric bypass here to follow-up for nausea vomiting which has resolved after commencing on 40 mg of pantoprazole daily and treating her H. pylori positive breath test.  EGD did not demonstrate any anastomotic ulcers.   Plan 1.  Check for H. pylori eradication 2.  If H. pylori is eradicated we will plan to reduce dose of Protonix at her next office visit in 8 weeks   I discussed the assessment and treatment plan with the patient. The patient was provided an opportunity to ask questions and all were answered. The patient agreed with the plan and demonstrated an understanding of the instructions.   The patient was advised to call back or seek an in-person evaluation if the symptoms worsen or if the condition fails to improve as anticipated.  I provided 20 minutes of non-face-to-face time during this encounter.  Dr Tiffany Bellows MD,MRCP [facility]) Gastroenterology/Hepatology Pager:  [phone]   Speech recognition software was used to dictate this note.

## 2021-02-25 NOTE — Addendum Note (Signed)
Addended by: Wayna Chalet on: 02/25/2021 03:20 PM   Modules accepted: Orders

## 2021-03-08 DIAGNOSIS — R059 Cough, unspecified: Secondary | ICD-10-CM | POA: Insufficient documentation

## 2021-03-08 DIAGNOSIS — U071 COVID-19: Secondary | ICD-10-CM | POA: Insufficient documentation

## 2021-03-08 DIAGNOSIS — R509 Fever, unspecified: Secondary | ICD-10-CM | POA: Insufficient documentation

## 2021-03-21 LAB — H. PYLORI BREATH TEST: H pylori Breath Test: NEGATIVE

## 2021-04-03 ENCOUNTER — Other Ambulatory Visit: Payer: Self-pay

## 2021-04-03 MED ORDER — PANTOPRAZOLE SODIUM 20 MG PO TBEC: 20.0000 mg | DELAYED_RELEASE_TABLET | Freq: Every day | ORAL | 3 refills | Status: DC

## 2021-04-08 DIAGNOSIS — Z862 Personal history of diseases of the blood and blood-forming organs and certain disorders involving the immune mechanism: Secondary | ICD-10-CM | POA: Insufficient documentation

## 2021-04-08 DIAGNOSIS — R0602 Shortness of breath: Secondary | ICD-10-CM | POA: Insufficient documentation

## 2021-04-08 DIAGNOSIS — Z8709 Personal history of other diseases of the respiratory system: Secondary | ICD-10-CM | POA: Insufficient documentation

## 2021-04-08 DIAGNOSIS — J441 Chronic obstructive pulmonary disease with (acute) exacerbation: Secondary | ICD-10-CM | POA: Insufficient documentation

## 2021-04-08 DIAGNOSIS — Z8616 Personal history of COVID-19: Secondary | ICD-10-CM | POA: Insufficient documentation

## 2021-05-06 ENCOUNTER — Other Ambulatory Visit: Payer: Self-pay

## 2021-05-06 ENCOUNTER — Encounter: Payer: Self-pay | Admitting: Gastroenterology

## 2021-05-06 ENCOUNTER — Ambulatory Visit (INDEPENDENT_AMBULATORY_CARE_PROVIDER_SITE_OTHER): Payer: Medicare Other | Admitting: Gastroenterology

## 2021-05-06 VITALS — BP 94/66 | HR 72 | Temp 98.3°F | Ht 64.0 in | Wt 170.2 lb

## 2021-05-06 DIAGNOSIS — K219 Gastro-esophageal reflux disease without esophagitis: Secondary | ICD-10-CM

## 2021-05-06 DIAGNOSIS — A048 Other specified bacterial intestinal infections: Secondary | ICD-10-CM

## 2021-05-06 DIAGNOSIS — M1710 Unilateral primary osteoarthritis, unspecified knee: Secondary | ICD-10-CM | POA: Insufficient documentation

## 2021-05-06 NOTE — Progress Notes (Signed)
Jonathon Bellows MD, MRCP(U.K) [address]  [place]  [place], [place] [address]  Main: [phone]  Fax: [phone]   Primary Care Physician: Bonnita Hollow, MD  Primary Gastroenterologist:  Dr. Jonathon Bellows     Chief complaint: Follow-up for GERD   HPI: [name] is a 70 y.o. female  Summary of history :   Initially referred and seen on 01/08/2021 for vomiting after eating.  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  In 2019 she was seen by [facility] and underwent hemorrhoidal banding.  She has been having issues with vomiting for the past few months.Does complain of heartburn despite being on famotidine.  Previously was on pantoprazole.  He says it was stopped due to concern about long-term usage.  She states she has had an anastomotic ulcer many years back and she is concerned that the symptoms she has presently similar.  She also has abdominal discomfort after eating very similar to what she has had in the past.  Denies any melena.  Denies any NSAID use.  Seen by the cancer center for iron deficiency anemia in March 2022 hemoglobin 13.1 g has received IV iron.   01/08/2021: Tested positive H. pylori.  Treated with quadruple based bismuth regimen. 02/04/2021: Hemoglobin 13.5 g, ferritin 213, B12 normal, folate normal, CMP normal 01/28/2021: EGD: Features of gastric bypass seen otherwise procedure was normal.  Colonoscopy also performed on the same day.  2 sessile polyps 10 to 12 mm were resected colon there were tubular adenomas repeat colonoscopy in 3 years    Interval history   02/25/2021-05/06/2021   03/19/2021: H pylori breath test negative    On Prilosec 20 mg a day.  Her main complaint at this point of time is that she feels full after a few bites of food less appetite.  Otherwise no other complaints.  Denies any abdominal pain.   Current Outpatient Medications  Medication Sig Dispense  Refill   pantoprazole (PROTONIX) 20 MG tablet Take 1 tablet (20 mg total) by mouth daily. 90 tablet 3   calcitRIOL (ROCALTROL) 0.5 MCG capsule Take 0.5 mcg by mouth daily.  5   Cholecalciferol (VITAMIN D3) 5000 UNITS CAPS Take 1 capsule by mouth daily.      denosumab (PROLIA) 60 MG/ML SOSY injection Inject 60 mg into the skin every 6 (six) months.     diphenhydrAMINE (BENADRYL) 25 MG tablet Take 25 mg by mouth at bedtime as needed.  (Patient not taking: Reported on 01/28/2021)     Melatonin 5 MG CAPS Take by mouth as needed.      Multiple Vitamin (MULTIVITAMIN) tablet Take 1 tablet by mouth 2 (two) times daily.      potassium chloride SA (KLOR-CON) 20 MEQ tablet Take 1 tablet (20 mEq total) by mouth 2 (two) times daily. (Patient not taking: Reported on 01/28/2021) 10 tablet 0   VITAMIN A PO Take 1 capsule by mouth daily.      VITAMIN E PO Take 1 capsule by mouth daily.      No current facility-administered medications for this visit.    Allergies as of 05/06/2021 - Review Complete 05/06/2021  Allergen Reaction Noted   Lac bovis Diarrhea 08/15/2014   Prednisone Diarrhea 08/15/2014   Sulfa antibiotics Itching 08/15/2014   Tape Dermatitis 02/24/2018   Zithromax [azithromycin] Diarrhea 08/15/2014    ROS:  General: Negative for anorexia, weight loss, fever, chills, fatigue, weakness. ENT: Negative  for hoarseness, difficulty swallowing , nasal congestion. CV: Negative for chest pain, angina, palpitations, dyspnea on exertion, peripheral edema.  Respiratory: Negative for dyspnea at rest, dyspnea on exertion, cough, sputum, wheezing.  GI: See history of present illness. GU:  Negative for dysuria, hematuria, urinary incontinence, urinary frequency, nocturnal urination.  Endo: Negative for unusual weight change.    Physical Examination:   BP 94/66   Pulse 72   Temp 98.3 F (36.8 C) (Oral)   Ht '5\' 4"'$  (1.626 m)   Wt 170 lb 3.2 oz (77.2 kg)   BMI 29.21 kg/m   General: Well-nourished,  well-developed in no acute distress.  Eyes: No icterus. Conjunctivae pink. Mouth: Oropharyngeal mucosa moist and pink , no lesions erythema or exudate. Neuro: Alert and oriented x 3.  Grossly intact. Skin: Warm and dry, no jaundice.   Psych: Alert and cooperative, normal mood and affect.   Imaging Studies: No results found.  Assessment and Plan:   [name] is a 70 y.o. y/o female  with a past medical history of Roux-en-Y gastric bypass here to follow-up for nausea vomiting which has resolved after commencing on 40 mg of pantoprazole daily and treating her H. pylori positive breath test, confirmation of eradication.  EGD did not demonstrate any anastomotic ulcers.   Plan 1.  Continue on Prilosec 20 mg a day.  Follow-up as needed  Dr Jonathon Bellows  MD,MRCP [facility]) Follow up in as needed

## 2021-05-22 ENCOUNTER — Other Ambulatory Visit (HOSPITAL_COMMUNITY): Payer: Medicare Other

## 2021-05-28 ENCOUNTER — Other Ambulatory Visit: Payer: Self-pay

## 2021-05-28 ENCOUNTER — Ambulatory Visit (HOSPITAL_COMMUNITY): Payer: Medicare Other | Attending: Internal Medicine | Admitting: Physical Therapy

## 2021-05-28 DIAGNOSIS — I890 Lymphedema, not elsewhere classified: Secondary | ICD-10-CM | POA: Diagnosis present

## 2021-05-28 DIAGNOSIS — R262 Difficulty in walking, not elsewhere classified: Secondary | ICD-10-CM | POA: Diagnosis present

## 2021-05-29 ENCOUNTER — Inpatient Hospital Stay (HOSPITAL_COMMUNITY): Payer: Medicare Other | Attending: Hematology

## 2021-05-29 ENCOUNTER — Ambulatory Visit (HOSPITAL_COMMUNITY): Payer: Medicare Other | Admitting: Hematology

## 2021-05-29 DIAGNOSIS — D509 Iron deficiency anemia, unspecified: Secondary | ICD-10-CM | POA: Diagnosis not present

## 2021-05-29 DIAGNOSIS — Z8711 Personal history of peptic ulcer disease: Secondary | ICD-10-CM | POA: Insufficient documentation

## 2021-05-29 DIAGNOSIS — Z9884 Bariatric surgery status: Secondary | ICD-10-CM | POA: Insufficient documentation

## 2021-05-29 DIAGNOSIS — D500 Iron deficiency anemia secondary to blood loss (chronic): Secondary | ICD-10-CM

## 2021-05-29 LAB — VITAMIN B12: Vitamin B-12: 809 pg/mL (ref 180–914)

## 2021-05-29 LAB — CBC WITH DIFFERENTIAL/PLATELET
Abs Immature Granulocytes: 0.02 10*3/uL (ref 0.00–0.07)
Basophils Absolute: 0 10*3/uL (ref 0.0–0.1)
Basophils Relative: 1 %
Eosinophils Absolute: 0.1 10*3/uL (ref 0.0–0.5)
Eosinophils Relative: 1 %
HCT: 38.1 % (ref 36.0–46.0)
Hemoglobin: 12.1 g/dL (ref 12.0–15.0)
Immature Granulocytes: 0 %
Lymphocytes Relative: 24 %
Lymphs Abs: 1.6 10*3/uL (ref 0.7–4.0)
MCH: 32.4 pg (ref 26.0–34.0)
MCHC: 31.8 g/dL (ref 30.0–36.0)
MCV: 101.9 fL — ABNORMAL HIGH (ref 80.0–100.0)
Monocytes Absolute: 0.4 10*3/uL (ref 0.1–1.0)
Monocytes Relative: 7 %
Neutro Abs: 4.4 10*3/uL (ref 1.7–7.7)
Neutrophils Relative %: 67 %
Platelets: 191 10*3/uL (ref 150–400)
RBC: 3.74 MIL/uL — ABNORMAL LOW (ref 3.87–5.11)
RDW: 13.9 % (ref 11.5–15.5)
WBC: 6.5 10*3/uL (ref 4.0–10.5)
nRBC: 0 % (ref 0.0–0.2)

## 2021-05-29 LAB — COMPREHENSIVE METABOLIC PANEL
ALT: 30 U/L (ref 0–44)
AST: 31 U/L (ref 15–41)
Albumin: 2.5 g/dL — ABNORMAL LOW (ref 3.5–5.0)
Alkaline Phosphatase: 66 U/L (ref 38–126)
Anion gap: 8 (ref 5–15)
BUN: 10 mg/dL (ref 8–23)
CO2: 25 mmol/L (ref 22–32)
Calcium: 8.1 mg/dL — ABNORMAL LOW (ref 8.9–10.3)
Chloride: 108 mmol/L (ref 98–111)
Creatinine, Ser: 0.62 mg/dL (ref 0.44–1.00)
GFR, Estimated: >60 mL/min (ref >60)
Glucose, Bld: 77 mg/dL (ref 70–99)
Potassium: 4 mmol/L (ref 3.5–5.1)
Sodium: 141 mmol/L (ref 135–145)
Total Bilirubin: 0.9 mg/dL (ref 0.3–1.2)
Total Protein: 4.7 g/dL — ABNORMAL LOW (ref 6.5–8.1)

## 2021-05-29 LAB — IRON AND TIBC
Iron: 111 ug/dL (ref 28–170)
Saturation Ratios: 91 % — ABNORMAL HIGH (ref 10.4–31.8)
TIBC: 122 ug/dL — ABNORMAL LOW (ref 250–450)
UIBC: 11 ug/dL

## 2021-05-29 LAB — FERRITIN: Ferritin: 263 ng/mL (ref 11–307)

## 2021-05-29 LAB — FOLATE: Folate: 23.5 ng/mL (ref >5.9)

## 2021-05-29 NOTE — Therapy (Signed)
[place], [place], [address] Phone: [phone]   Fax:  [phone]  Physical Therapy Evaluation  Patient Details  Name: [name] MRN: [id number] Date of Birth: 1950/11/10 Referring Provider (PT): Stana Bunting   Encounter Date: 05/28/2021   PT End of Session - 05/28/21 1654     Visit Number 1    Number of Visits 18    Date for PT Re-Evaluation 07/09/21    Authorization Type [employer] medicare    Progress Note Due on Visit 10    PT Start Time 1535    PT Stop Time 1650    PT Time Calculation (min) 75 min             Past Medical History:  Diagnosis Date   Asthma    GERD (gastroesophageal reflux disease)    Internal hemorrhoids    Iron deficiency anemia    Osteoporosis    Peptic ulcer    Tubular adenoma of colon    Vertigo     Past Surgical History:  Procedure Laterality Date   BACK SURGERY     BREAST SURGERY     CHOLECYSTECTOMY     COLONOSCOPY WITH PROPOFOL N/A 06/02/2017   Procedure: COLONOSCOPY WITH PROPOFOL;  Surgeon: Jonathon Bellows, MD;  Location: [facility] ENDOSCOPY;  Service: Gastroenterology;  Laterality: N/A;   COLONOSCOPY WITH PROPOFOL N/A 01/28/2021   Procedure: COLONOSCOPY WITH PROPOFOL;  Surgeon: Jonathon Bellows, MD;  Location: [facility] ENDOSCOPY;  Service: Gastroenterology;  Laterality: N/A;   ESOPHAGOGASTRODUODENOSCOPY (EGD) WITH PROPOFOL N/A 07/01/2017   Procedure: ESOPHAGOGASTRODUODENOSCOPY (EGD) WITH PROPOFOL;  Surgeon: Jonathon Bellows, MD;  Location: [facility] ENDOSCOPY;  Service: Gastroenterology;  Laterality: N/A;   ESOPHAGOGASTRODUODENOSCOPY (EGD) WITH PROPOFOL N/A 01/28/2021   Procedure: ESOPHAGOGASTRODUODENOSCOPY (EGD) WITH PROPOFOL;  Surgeon: Jonathon Bellows, MD;  Location: [facility] ENDOSCOPY;  Service: Gastroenterology;  Laterality: N/A;   FLEXIBLE SIGMOIDOSCOPY N/A 07/01/2017   Procedure: FLEXIBLE SIGMOIDOSCOPY;  Surgeon: Jonathon Bellows, MD;  Location: [facility] ENDOSCOPY;  Service: Gastroenterology;   Laterality: N/A;   KNEE SURGERY     mini gastric bypass     SHOULDER SURGERY     TONSILLECTOMY      There were no vitals filed for this visit.    Subjective Assessment - 05/28/21 1538     Subjective Ms. Burns is a known patient to this clinic. She has been diagnosed with primary lymhedema.  She was discharged from lymphedema treatment in February of 2020.  She states that she still has her pump and uses it once a day.   She states that she had compression garments but due to her RA she could not get them on, therefore, she got juxtafits. She states that several months following stopping treatment she stopped wearing the juxtafit, as she felt that her legs were doing better.  This year she spent the summer at the [facility] and it was hot and humid and noticed that she was quite swollen.   She started  pumping twice a day and wearing her juxtafit but she still has significant swelling therefore she got her MD to send her to this clinic.    Pertinent History lymphedema    Limitations Walking;Standing    How long can you stand comfortably? 5 minuts    How long can you walk comfortably? 5-10 minutes at the most    Patient Stated Goals to get her swelling down    Currently in Pain? Yes    Pain Score 5  Pain Location Leg               05/28/21 0001  Assessment  Medical Diagnosis lymphedema  Referring Provider (PT) Stana Bunting  Onset Date/Surgical Date 03/14/21  Prior Therapy in 2020  Precautions  Precautions  (cellulitis)  Restrictions  Weight Bearing Restrictions No  Balance Screen  Has the patient fallen in the past 6 months No  Has the patient had a decrease in activity level because of a fear of falling?  Yes  Is the patient reluctant to leave their home because of a fear of falling?  No  Prior Function  Level of Independence Independent  Observation/Other Assessments  Skin Integrity increase rubor, slightly warm  Focus on Therapeutic Outcomes (FOTO)  life impact 65            05/28/21 0001  What other symptoms do you have  Are you Having Heaviness or Tightness Yes  Are you having Pain Yes  Are you having pitting edema Yes  Body Site legs  Is it Hard or Difficult finding clothes that fit Yes  Do you have infections No  Lymphedema Stage  Stage STAGE 2 SPONTANEOUSLY IRREVERSIBLE  Lymphedema Assessments  Lymphedema Assessments LE  Right Lower Extremity Lymphedema  20 cm Proximal to Suprapatella 60.5 cm  10 cm Proximal to Suprapatella 56 cm  At Midpatella/Popliteal Crease 50.3 cm  30 cm Proximal to Floor at Lateral Plantar Foot 47.8 cm  20 cm Proximal to Floor at Lateral Plantar Foot 42.'3 1  10 '$ cm Proximal to Floor at Lateral Malleoli 30.9 cm  Circumference of ankle/heel 35 cm.  5 cm Proximal to 1st MTP Joint 23 cm  Across MTP Joint 23.5 cm  Left Lower Extremity Lymphedema  20 cm Proximal to Suprapatella 60 cm  10 cm Proximal to Suprapatella 56.3 cm  At Midpatella/Popliteal Crease 49.9 cm  30 cm Proximal to Floor at Lateral Plantar Foot 46.5 cm  20 cm Proximal to Floor at Lateral Plantar Foot 41.3 cm  10 cm Proximal to Floor at Lateral Malleoli 31.3 cm  Circumference of ankle/heel 36 cm.  5 cm Proximal to 1st MTP Joint 23.9 cm  Across MTP Joint 24 cm          Objective measurements completed on examination: See above findings.                PT Education - 05/28/21 1645     Education Details The need for pt to get an ankle foot compression with the juxtafit, that she should be wearing her juxtafit all day everyday.  Reviewed exercises that the patient should be completing    Person(s) Educated Patient    Methods Explanation    Comprehension Verbalized understanding              PT Short Term Goals - 05/28/21 1708       PT SHORT TERM GOAL #1   Title Pt to be I in self manual techniques.    Time 3    Period Weeks    Status New    Target Date 06/19/21      PT SHORT TERM GOAL #2   Title PT to have  decreased 2-3 cm in thigh and leg, not foot to decrease risk of cellulitis.    Time 3    Period Weeks    Status New               PT Long Term Goals - 05/28/21 1708  PT LONG TERM GOAL #1   Title PT to have obtained an Ankle foot wrap and verbalize that she is to wear the compression garment on a daily basis not just when she flares up    Time 6    Period Weeks    Status New    Target Date 07/10/21      PT LONG TERM GOAL #2   Title PT to have lost between 3-4 cm from thigh and leg measurements on average, not foot to decrease cellulitis and be able to fit into shoes and clothing easier.    Time 6    Period Weeks    Status New                    Plan - 05/28/21 1700     Clinical Impression Statement Ms. Burns is a 70 yo known pt with primary lymphedema.  She was discharged in Feburary of 2020.  At this time she had exercises, a pump and juxtafit.  She quit completing her exercises and wearing her juxtafit as she felt that her swelling was under control but she did continue pumping and on most days pumped twice a day.  She does not have the ankle foot wrap and was advised to get them.  Upon evaluation the pt has noted increased edema in B LE, as well as increased pain which is affecting her ability to walk.  Ms. Burns will benefit from skilled PT to recieve complete decongestive techniques to decrease her volume, improve her pain, decrease her risk of cellulitis and improve her ambulation.    Personal Factors and Comorbidities Comorbidity 2;Comorbidity 3+;Fitness;Time since onset of injury/illness/exacerbation    Comorbidities gastric bypass followed by plastic surgery to remove excessive skin as pt lost over 250#, OA,    Examination-Activity Limitations Dressing;Locomotion Level;Stand    Examination-Participation Restrictions Cleaning;Community Activity;Shop;Meal Prep    Stability/Clinical Decision Making Evolving/Moderate complexity    Clinical Decision Making  Moderate    Rehab Potential Good    PT Frequency 3x / week    PT Duration 6 weeks    PT Treatment/Interventions Manual techniques;Therapeutic activities;Patient/family education;Manual lymph drainage;Therapeutic exercise    PT Next Visit Plan cut foam for LE and thighs B complete manual decongestive techniques begin compression bandaging if pt has the bandages.    PT Home Exercise Plan ankle pump, LAQ, hip ab/adduction, march, diaphragmic breathing and lymphatic squeeze.             Patient will benefit from skilled therapeutic intervention in order to improve the following deficits and impairments:  Pain, Difficulty walking, Increased edema  Visit Diagnosis: Lymphedema, not elsewhere classified  Difficulty in walking, not elsewhere classified     Problem List Patient Active Problem List   Diagnosis Date Noted   Unilateral primary osteoarthritis, unspecified knee 05/06/2021   Acute exacerbation of chronic obstructive pulmonary disease (COPD) ([place]) 04/08/2021   History of anemia 04/08/2021   History of asthma 04/08/2021   History of COVID-19 04/08/2021   Shortness of breath 04/08/2021   Acute COVID-19 03/08/2021   Cough 03/08/2021   Fever 03/08/2021   Colon cancer screening 01/08/2021   Arthritis of right hand 11/15/2019   Age-related osteoporosis without current pathological fracture 10/30/2019   Left-sided weakness    Stroke-like symptoms 02/24/2018   HTN (hypertension) 02/24/2018   Primary osteoarthritis of both hands 11/03/2017   Dupuytren's contracture of left hand 11/03/2017   DDD (degenerative disc disease), lumbar 11/03/2017   History  of gastric bypass 11/03/2017   Lymphedema 09/17/2017   History of vertebral fracture 09/17/2017   History of osteoporosis 09/17/2017   Iron deficiency anemia due to chronic blood loss 06/28/2017   Lower GI bleed 05/31/2017   Rectal mass 05/31/2017   Asthma 05/31/2017   GERD (gastroesophageal reflux disease) 05/31/2017    Closed fracture of upper end of humerus 09/23/2016   Tendinitis 09/23/2016   Patulous eustachian tube of right ear 11/07/2015   Superior semicircular canal dehiscence, 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 [place], [place], [address] Phone: [phone]   Fax:  [phone]  Name: [name] MRN: [id number] Date of Birth: 10-Sep-1951

## 2021-05-29 NOTE — Addendum Note (Signed)
Addended by: Leeroy Cha on: 05/29/2021 08:27 AM   Modules accepted: Orders

## 2021-05-30 ENCOUNTER — Encounter (HOSPITAL_COMMUNITY): Payer: Medicare Other

## 2021-06-02 ENCOUNTER — Other Ambulatory Visit: Payer: Self-pay

## 2021-06-02 ENCOUNTER — Ambulatory Visit (HOSPITAL_COMMUNITY): Payer: Medicare Other | Admitting: Physical Therapy

## 2021-06-02 DIAGNOSIS — I890 Lymphedema, not elsewhere classified: Secondary | ICD-10-CM | POA: Diagnosis not present

## 2021-06-02 NOTE — Therapy (Signed)
[place], [place], [address] Phone: [phone]   Fax:  [phone]  Physical Therapy Treatment  Patient Details  Name: [name] MRN: [id number] Date of Birth: [birth year] Referring Provider (PT): Stana Bunting   Encounter Date: 06/02/2021   PT End of Session - 06/02/21 1722     Visit Number 2    Number of Visits 18    Date for PT Re-Evaluation 07/09/21    Authorization Type [employer] medicare    Progress Note Due on Visit 10    PT Start Time [id number]    PT Stop Time 1648    PT Time Calculation (min) 78 min             Past Medical History:  Diagnosis Date   Asthma    GERD (gastroesophageal reflux disease)    Internal hemorrhoids    Iron deficiency anemia    Osteoporosis    Peptic ulcer    Tubular adenoma of colon    Vertigo     Past Surgical History:  Procedure Laterality Date   BACK SURGERY     BREAST SURGERY     CHOLECYSTECTOMY     COLONOSCOPY WITH PROPOFOL N/A 06/02/2017   Procedure: COLONOSCOPY WITH PROPOFOL;  Surgeon: Jonathon Bellows, MD;  Location: [facility] ENDOSCOPY;  Service: Gastroenterology;  Laterality: N/A;   COLONOSCOPY WITH PROPOFOL N/A 01/28/2021   Procedure: COLONOSCOPY WITH PROPOFOL;  Surgeon: Jonathon Bellows, MD;  Location: [facility] ENDOSCOPY;  Service: Gastroenterology;  Laterality: N/A;   ESOPHAGOGASTRODUODENOSCOPY (EGD) WITH PROPOFOL N/A 07/01/2017   Procedure: ESOPHAGOGASTRODUODENOSCOPY (EGD) WITH PROPOFOL;  Surgeon: Jonathon Bellows, MD;  Location: [facility] ENDOSCOPY;  Service: Gastroenterology;  Laterality: N/A;   ESOPHAGOGASTRODUODENOSCOPY (EGD) WITH PROPOFOL N/A 01/28/2021   Procedure: ESOPHAGOGASTRODUODENOSCOPY (EGD) WITH PROPOFOL;  Surgeon: Jonathon Bellows, MD;  Location: [facility] ENDOSCOPY;  Service: Gastroenterology;  Laterality: N/A;   FLEXIBLE SIGMOIDOSCOPY N/A 07/01/2017   Procedure: FLEXIBLE SIGMOIDOSCOPY;  Surgeon: Jonathon Bellows, MD;  Location: [facility] ENDOSCOPY;  Service: Gastroenterology;   Laterality: N/A;   KNEE SURGERY     mini gastric bypass     SHOULDER SURGERY     TONSILLECTOMY      There were no vitals filed for this visit.   Subjective Assessment - 06/02/21 1720     Subjective pt returns today with all bandages for bilateral LE's.  States she has aching and pain mostly at night when she lays down to go to sleep.    Currently in Pain? Yes    Pain Score 4     Pain Location Leg    Pain Orientation Right;Left                               [facility] Adult PT Treatment/Exercise - 06/02/21 0001       Manual Therapy   Manual Therapy Manual Lymphatic Drainage (MLD);Compression Bandaging    Manual therapy comments completed seperate from all other aspects    Manual Lymphatic Drainage (MLD) to include supraclavicular, deep and superfical abdominal, inguinal axillary anastomosis followed by B LE    Compression Bandaging distal LE's completed as not enough foam for upper thighs.                       PT Short Term Goals - 05/28/21 1708       PT SHORT TERM GOAL #1   Title Pt to be I in self  manual techniques.    Time 3    Period Weeks    Status New    Target Date 06/19/21      PT SHORT TERM GOAL #2   Title PT to have decreased 2-3 cm in thigh and leg, not foot to decrease risk of cellulitis.    Time 3    Period Weeks    Status New               PT Long Term Goals - 05/28/21 1708       PT LONG TERM GOAL #1   Title PT to have obtained an Ankle foot wrap and verbalize that she is to wear the compression garment on a daily basis not just when she flares up    Time 6    Period Weeks    Status New    Target Date 07/10/21      PT LONG TERM GOAL #2   Title PT to have lost between 3-4 cm from thigh and leg measurements on average, not foot to decrease cellulitis and be able to fit into shoes and clothing easier.    Time 6    Period Weeks    Status New                   Plan - 06/02/21 1723     Clinical  Impression Statement Foam cut for distal Bil LE's and bandaging only completed to this level as there was no other foam available.  Manual lymph drainage completed to anterior aspect only this session.  LE's moisturized and bandaged with overall comfort reported.  PT given bandaging instructions and instructed to remove prior to next appt and re-roll.  Remaining bandaging placed in a bag under cabinet in lymph room (pt's name on bag).  Will begin upper thigh bandaging when more foam comes in.  Pt will benefit from complete lymphedema therapy for bilateral LE's.    Personal Factors and Comorbidities Comorbidity 2;Comorbidity 3+;Fitness;Time since onset of injury/illness/exacerbation    Comorbidities gastric bypass followed by plastic surgery to remove excessive skin as pt lost over 250#, OA,    Examination-Activity Limitations Dressing;Locomotion Level;Stand    Examination-Participation Restrictions Cleaning;Community Activity;Shop;Meal Prep    Stability/Clinical Decision Making Evolving/Moderate complexity    Rehab Potential Good    PT Frequency 3x / week    PT Duration 6 weeks    PT Treatment/Interventions Manual techniques;Therapeutic activities;Patient/family education;Manual lymph drainage;Therapeutic exercise    PT Next Visit Plan cut foam and begin bandaging to bil thighs when foam comes int.  complete manual decongestive techniques and measure on WEdnesdays.    PT Home Exercise Plan ankle pump, LAQ, hip ab/adduction, march, diaphragmic breathing and lymphatic squeeze.             Patient will benefit from skilled therapeutic intervention in order to improve the following deficits and impairments:  Pain, Difficulty walking, Increased edema  Visit Diagnosis: Lymphedema, not elsewhere classified     Problem List Patient Active Problem List   Diagnosis Date Noted   Unilateral primary osteoarthritis, unspecified knee 05/06/2021   Acute exacerbation of chronic obstructive pulmonary  disease (COPD) ([place]) 04/08/2021   History of anemia 04/08/2021   History of asthma 04/08/2021   History of COVID-19 04/08/2021   Shortness of breath 04/08/2021   Acute COVID-19 03/08/2021   Cough 03/08/2021   Fever 03/08/2021   Colon cancer screening 01/08/2021   Arthritis of right hand 11/15/2019   Age-related osteoporosis without  current pathological fracture 10/30/2019   Left-sided weakness    Stroke-like symptoms 02/24/2018   HTN (hypertension) 02/24/2018   Primary osteoarthritis of both hands 11/03/2017   Dupuytren's contracture of left hand 11/03/2017   DDD (degenerative disc disease), lumbar 11/03/2017   History of gastric bypass 11/03/2017   Lymphedema 09/17/2017   History of vertebral fracture 09/17/2017   History of osteoporosis 09/17/2017   Iron deficiency anemia due to chronic blood loss 06/28/2017   Lower GI bleed 05/31/2017   Rectal mass 05/31/2017   Asthma 05/31/2017   GERD (gastroesophageal reflux disease) 05/31/2017   Closed fracture of upper end of humerus 09/23/2016   Tendinitis 09/23/2016   Patulous eustachian tube of right ear 11/07/2015   Superior semicircular canal dehiscence, 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 [place], [place], [address] Phone: [phone]   Fax:  [phone]  Name: [name] MRN: [id number] Date of Birth: 03-18-1951

## 2021-06-04 ENCOUNTER — Other Ambulatory Visit: Payer: Self-pay

## 2021-06-04 ENCOUNTER — Ambulatory Visit (HOSPITAL_COMMUNITY): Payer: Medicare Other

## 2021-06-04 ENCOUNTER — Encounter (HOSPITAL_COMMUNITY): Payer: Self-pay

## 2021-06-04 DIAGNOSIS — I890 Lymphedema, not elsewhere classified: Secondary | ICD-10-CM

## 2021-06-04 DIAGNOSIS — R262 Difficulty in walking, not elsewhere classified: Secondary | ICD-10-CM

## 2021-06-04 NOTE — Therapy (Addendum)
[place], [place], [address] Phone: [phone]   Fax:  [phone]  Physical Therapy Treatment  Patient Details  Name: [name] MRN: [id number] Date of Birth: [birth year] Referring Provider (PT): Stana Bunting   Encounter Date: 06/04/2021   PT End of Session - 06/04/21 1742     Visit Number 3    Number of Visits 18    Date for PT Re-Evaluation 07/09/21    Authorization Type [employer] medicare    Progress Note Due on Visit 10    PT Start Time 1621    PT Stop Time 1738    PT Time Calculation (min) 77 min    Activity Tolerance Patient tolerated treatment well    Behavior During Therapy [facility] for tasks assessed/performed             Past Medical History:  Diagnosis Date   Asthma    GERD (gastroesophageal reflux disease)    Internal hemorrhoids    Iron deficiency anemia    Osteoporosis    Peptic ulcer    Tubular adenoma of colon    Vertigo     Past Surgical History:  Procedure Laterality Date   BACK SURGERY     BREAST SURGERY     CHOLECYSTECTOMY     COLONOSCOPY WITH PROPOFOL N/A 06/02/2017   Procedure: COLONOSCOPY WITH PROPOFOL;  Surgeon: Jonathon Bellows, MD;  Location: [facility] ENDOSCOPY;  Service: Gastroenterology;  Laterality: N/A;   COLONOSCOPY WITH PROPOFOL N/A 01/28/2021   Procedure: COLONOSCOPY WITH PROPOFOL;  Surgeon: Jonathon Bellows, MD;  Location: [facility] ENDOSCOPY;  Service: Gastroenterology;  Laterality: N/A;   ESOPHAGOGASTRODUODENOSCOPY (EGD) WITH PROPOFOL N/A 07/01/2017   Procedure: ESOPHAGOGASTRODUODENOSCOPY (EGD) WITH PROPOFOL;  Surgeon: Jonathon Bellows, MD;  Location: [facility] ENDOSCOPY;  Service: Gastroenterology;  Laterality: N/A;   ESOPHAGOGASTRODUODENOSCOPY (EGD) WITH PROPOFOL N/A 01/28/2021   Procedure: ESOPHAGOGASTRODUODENOSCOPY (EGD) WITH PROPOFOL;  Surgeon: Jonathon Bellows, MD;  Location: [facility] ENDOSCOPY;  Service: Gastroenterology;  Laterality: N/A;   FLEXIBLE SIGMOIDOSCOPY N/A 07/01/2017    Procedure: FLEXIBLE SIGMOIDOSCOPY;  Surgeon: Jonathon Bellows, MD;  Location: [facility] ENDOSCOPY;  Service: Gastroenterology;  Laterality: N/A;   KNEE SURGERY     mini gastric bypass     SHOULDER SURGERY     TONSILLECTOMY      There were no vitals filed for this visit.   Subjective Assessment - 06/04/21 1624     Subjective Reports she wore bandages until this morning and noted reduction.    Pertinent History lymphedema    Patient Stated Goals to get her swelling down    Currently in Pain? No/denies                   LYMPHEDEMA/ONCOLOGY QUESTIONNAIRE - 06/04/21 0001       Lymphedema Assessments   Lymphedema Assessments Lower extremities      Right Lower Extremity Lymphedema   20 cm Proximal to Suprapatella 61 cm   initial eval 9/14 was: 60.5   10 cm Proximal to Suprapatella 53.2 cm   initial eval 9/14 was: 56   At Midpatella/Popliteal Crease 42.7 cm   initial eval 9/14 was: 50.3   30 cm Proximal to Floor at Lateral Plantar Foot 38.3 cm   initial eval 9/14 was: 4738   20 cm Proximal to Floor at Lateral Plantar Foot 30.1 1   initial eval 9/14 was: 42.3   10 cm Proximal to Floor at Lateral Malleoli 30.1 cm   initial eval 9/14 was: 30.9  Circumference of ankle/heel 34 cm.   initial eval 9/14 was: 35 cm   5 cm Proximal to 1st MTP Joint 23 cm   initial eval 9/14 was: 23 cm   Across MTP Joint 22.6 cm   initial eval 9/14 was: 23.5     Left Lower Extremity Lymphedema   20 cm Proximal to Suprapatella 59.6 cm   initial eval 9/14 was: 60   10 cm Proximal to Suprapatella 53.5 cm   initial eval 9/14 was: 56.3   At Midpatella/Popliteal Crease 47 cm   initial eval 9/14 was: 49.9   30 cm Proximal to Floor at Lateral Plantar Foot 43 cm   initial eval 9/14 was: 46.5   20 cm Proximal to Floor at Lateral Plantar Foot 36.3 cm   initial eval 9/14 was: 41.3   10 cm Proximal to Floor at Lateral Malleoli 28 cm   initial eval 9/14 was: 31.3   Circumference of ankle/heel 33.9 cm.   initial eval 9/14  was: 36   5 cm Proximal to 1st MTP Joint 23 cm   initial eval 9/14 was: 23.9   Across MTP Joint 22.2 cm   initial eval 9/14 was: 24                       [facility] Adult PT Treatment/Exercise - 06/04/21 0001       Manual Therapy   Manual Therapy Manual Lymphatic Drainage (MLD);Compression Bandaging    Manual therapy comments completed seperate from all other aspects    Manual Lymphatic Drainage (MLD) to include supraclavicular, deep and superfical abdominal, inguinal axillary anastomosis followed by B LE    Compression Bandaging Multilayer short stretch bandages with 1/2in foam to distal LE, not enough foam for upper thighs                       PT Short Term Goals - 05/28/21 1708       PT SHORT TERM GOAL #1   Title Pt to be I in self manual techniques.    Time 3    Period Weeks    Status New    Target Date 06/19/21      PT SHORT TERM GOAL #2   Title PT to have decreased 2-3 cm in thigh and leg, not foot to decrease risk of cellulitis.    Time 3    Period Weeks    Status New               PT Long Term Goals - 05/28/21 1708       PT LONG TERM GOAL #1   Title PT to have obtained an Ankle foot wrap and verbalize that she is to wear the compression garment on a daily basis not just when she flares up    Time 6    Period Weeks    Status New    Target Date 07/10/21      PT LONG TERM GOAL #2   Title PT to have lost between 3-4 cm from thigh and leg measurements on average, not foot to decrease cellulitis and be able to fit into shoes and clothing easier.    Time 6    Period Weeks    Status New                   Plan - 06/04/21 1743     Clinical Impression Statement Measurements taken today with overall reduction in  volume BLE except for Rt upper thigh.  Manual lymph decongestive complete to anterior and posterior today.  Main induration around Bil mallelous, increased time spend manual today.  LE's moisturized and application of  multilayer short stretch bandages complete to Bil knees due to shortage of foam, reports of comfort at EOS.  Will begin upper thigh bandages as soon as foam come in.    Personal Factors and Comorbidities Comorbidity 2;Comorbidity 3+;Fitness;Time since onset of injury/illness/exacerbation    Comorbidities gastric bypass followed by plastic surgery to remove excessive skin as pt lost over 250#, OA,    Examination-Activity Limitations Dressing;Locomotion Level;Stand    Examination-Participation Restrictions Cleaning;Community Activity;Shop;Meal Prep    Stability/Clinical Decision Making Evolving/Moderate complexity    Clinical Decision Making Moderate    Rehab Potential Good    PT Frequency 3x / week    PT Duration 6 weeks    PT Treatment/Interventions Manual techniques;Therapeutic activities;Patient/family education;Manual lymph drainage;Therapeutic exercise    PT Next Visit Plan cut foam and begin bandaging to bil thighs when foam comes in.  complete manual decongestive techniques and measure on WEdnesdays.    PT Home Exercise Plan ankle pump, LAQ, hip ab/adduction, march, diaphragmic breathing and lymphatic squeeze.    Consulted and Agree with Plan of Care Patient             Patient will benefit from skilled therapeutic intervention in order to improve the following deficits and impairments:  Pain, Difficulty walking, Increased edema  Visit Diagnosis: Lymphedema, not elsewhere classified  Difficulty in walking, not elsewhere classified     Problem List Patient Active Problem List   Diagnosis Date Noted   Unilateral primary osteoarthritis, unspecified knee 05/06/2021   Acute exacerbation of chronic obstructive pulmonary disease (COPD) ([place]) 04/08/2021   History of anemia 04/08/2021   History of asthma 04/08/2021   History of COVID-19 04/08/2021   Shortness of breath 04/08/2021   Acute COVID-19 03/08/2021   Cough 03/08/2021   Fever 03/08/2021   Colon cancer screening  01/08/2021   Arthritis of right hand 11/15/2019   Age-related osteoporosis without current pathological fracture 10/30/2019   Left-sided weakness    Stroke-like symptoms 02/24/2018   HTN (hypertension) 02/24/2018   Primary osteoarthritis of both hands 11/03/2017   Dupuytren's contracture of left hand 11/03/2017   DDD (degenerative disc disease), lumbar 11/03/2017   History of gastric bypass 11/03/2017   Lymphedema 09/17/2017   History of vertebral fracture 09/17/2017   History of osteoporosis 09/17/2017   Iron deficiency anemia due to chronic blood loss 06/28/2017   Lower GI bleed 05/31/2017   Rectal mass 05/31/2017   Asthma 05/31/2017   GERD (gastroesophageal reflux disease) 05/31/2017   Closed fracture of upper end of humerus 09/23/2016   Tendinitis 09/23/2016   Patulous eustachian tube of right ear 11/07/2015   Superior semicircular canal dehiscence, bilateral 11/07/2015   Vitamin D deficiency 08/20/2014   Secondary hyperparathyroidism ([place]) 08/20/2014   Ihor Austin, LPTA/CLT; CBIS [phone]  Aldona Lento, PTA 06/04/2021, 6:02 PM  [place] [address] [place], [place], [address] Phone: [phone]   Fax:  [phone]  Name: Macenzie Ryals Burns MRN: [id number] Date of Birth: [birth year]

## 2021-06-04 NOTE — Progress Notes (Signed)
[place], [place] [address]   CLINIC:  Medical Oncology/Hematology  PCP:  Bonnita Hollow, MD [place] [address] [phone]   REASON FOR VISIT:  Follow-up for iron deficiency anemia  PRIOR THERAPY: None  CURRENT THERAPY: Intermittent IV Feraheme (last given 09/19/2019)  INTERVAL HISTORY:  [name] 70 y.o. female returns for routine follow-up of her iron deficiency.  She was last evaluated via telemedicine visit by NP Faythe Casa on 11/21/2020.  At today's visit, she reports feeling fair.  No recent hospitalizations, surgeries, or changes in baseline health status.  She does report significant fatigue at today's appointment, possibly related to poor appetite.  She denies any bleeding episodes such as epistaxis, hematemesis, hematochezia, or melena.  No symptoms of pica, chest pain, dyspnea on exertion, dizziness, or syncope.  She has very little energy and little to no appetite. She endorses that she is maintaining a stable weight.    REVIEW OF SYSTEMS:  Review of Systems  Constitutional:  Positive for fatigue. Negative for appetite change, chills, diaphoresis, fever and unexpected weight change.  HENT:   Negative for lump/mass and nosebleeds.   Eyes:  Negative for eye problems.  Respiratory:  Negative for cough, hemoptysis and shortness of breath.   Cardiovascular:  Positive for leg swelling (chronic lymphedema). Negative for chest pain and palpitations.  Gastrointestinal:  Positive for nausea and vomiting. Negative for abdominal pain, blood in stool, constipation and diarrhea.  Genitourinary:  Negative for hematuria.   Skin: Negative.   Neurological:  Negative for dizziness, headaches and light-headedness.  Hematological:  Does not bruise/bleed easily.     PAST MEDICAL/SURGICAL HISTORY:  Past Medical History:  Diagnosis Date   Asthma    GERD (gastroesophageal reflux disease)    Internal hemorrhoids    Iron  deficiency anemia    Osteoporosis    Peptic ulcer    Tubular adenoma of colon    Vertigo    Past Surgical History:  Procedure Laterality Date   BACK SURGERY     BREAST SURGERY     CHOLECYSTECTOMY     COLONOSCOPY WITH PROPOFOL N/A 06/02/2017   Procedure: COLONOSCOPY WITH PROPOFOL;  Surgeon: Jonathon Bellows, MD;  Location: [facility] ENDOSCOPY;  Service: Gastroenterology;  Laterality: N/A;   COLONOSCOPY WITH PROPOFOL N/A 01/28/2021   Procedure: COLONOSCOPY WITH PROPOFOL;  Surgeon: Jonathon Bellows, MD;  Location: [facility] ENDOSCOPY;  Service: Gastroenterology;  Laterality: N/A;   ESOPHAGOGASTRODUODENOSCOPY (EGD) WITH PROPOFOL N/A 07/01/2017   Procedure: ESOPHAGOGASTRODUODENOSCOPY (EGD) WITH PROPOFOL;  Surgeon: Jonathon Bellows, MD;  Location: [facility] ENDOSCOPY;  Service: Gastroenterology;  Laterality: N/A;   ESOPHAGOGASTRODUODENOSCOPY (EGD) WITH PROPOFOL N/A 01/28/2021   Procedure: ESOPHAGOGASTRODUODENOSCOPY (EGD) WITH PROPOFOL;  Surgeon: Jonathon Bellows, MD;  Location: [facility] ENDOSCOPY;  Service: Gastroenterology;  Laterality: N/A;   FLEXIBLE SIGMOIDOSCOPY N/A 07/01/2017   Procedure: FLEXIBLE SIGMOIDOSCOPY;  Surgeon: Jonathon Bellows, MD;  Location: [facility] ENDOSCOPY;  Service: Gastroenterology;  Laterality: N/A;   KNEE SURGERY     mini gastric bypass     SHOULDER SURGERY     TONSILLECTOMY       SOCIAL HISTORY:  Social History   Socioeconomic History   Marital status: Married    Spouse name: Not on file   Number of children: Not on file   Years of education: Not on file   Highest education level: Not on file  Occupational History   Not on file  Tobacco Use   Smoking status: Never  Smokeless tobacco: Never  Vaping Use   Vaping Use: Never used  Substance and Sexual Activity   Alcohol use: No    Alcohol/week: 0.0 standard drinks   Drug use: Never   Sexual activity: Never  Other Topics Concern   Not on file  Social History Narrative   Not on file   Social Determinants of Health   Financial Resource Strain:  Not on file  Food Insecurity: Not on file  Transportation Needs: Not on file  Physical Activity: Not on file  Stress: Not on file  Social Connections: Not on file  Intimate Partner Violence: Not on file    FAMILY HISTORY:  Family History  Problem Relation Age of Onset   Hypertension Other    Stroke Other    Diabetes Other    Heart attack Other    Obesity Other    Diabetes Father    Heart disease Father    Colon cancer Father    Breast cancer Paternal Grandmother    Healthy Daughter    Stomach cancer Neg Hx    Pancreatic cancer Neg Hx     CURRENT MEDICATIONS:  Outpatient Encounter Medications as of 06/05/2021  Medication Sig   pantoprazole (PROTONIX) 20 MG tablet Take 1 tablet (20 mg total) by mouth daily.   calcitRIOL (ROCALTROL) 0.5 MCG capsule Take 0.5 mcg by mouth daily.   Cholecalciferol (VITAMIN D3) 5000 UNITS CAPS Take 1 capsule by mouth daily.    denosumab (PROLIA) 60 MG/ML SOSY injection Inject 60 mg into the skin every 6 (six) months.   diphenhydrAMINE (BENADRYL) 25 MG tablet Take 25 mg by mouth at bedtime as needed.  (Patient not taking: Reported on 01/28/2021)   Melatonin 5 MG CAPS Take by mouth as needed.    Multiple Vitamin (MULTIVITAMIN) tablet Take 1 tablet by mouth 2 (two) times daily.    potassium chloride SA (KLOR-CON) 20 MEQ tablet Take 1 tablet (20 mEq total) by mouth 2 (two) times daily. (Patient not taking: Reported on 01/28/2021)   VITAMIN A PO Take 1 capsule by mouth daily.    VITAMIN E PO Take 1 capsule by mouth daily.    No facility-administered encounter medications on file as of 06/05/2021.    ALLERGIES:  Allergies  Allergen Reactions   Lac Bovis Diarrhea   Prednisone Diarrhea   Sulfa Antibiotics Itching   Tape Dermatitis   Zithromax [Azithromycin] Diarrhea     PHYSICAL EXAM:  ECOG PERFORMANCE STATUS: 1 - Symptomatic but completely ambulatory  There were no vitals filed for this visit. There were no vitals filed for this  visit. Physical Exam Constitutional:      Appearance: Normal appearance.  HENT:     Head: Normocephalic and atraumatic.     Mouth/Throat:     Mouth: Mucous membranes are moist.  Eyes:     Extraocular Movements: Extraocular movements intact.     Pupils: Pupils are equal, round, and reactive to light.  Cardiovascular:     Rate and Rhythm: Normal rate and regular rhythm.     Pulses: Normal pulses.     Heart sounds: Normal heart sounds.  Pulmonary:     Effort: Pulmonary effort is normal.     Breath sounds: Normal breath sounds.  Abdominal:     General: Bowel sounds are normal.     Palpations: Abdomen is soft.     Tenderness: There is no abdominal tenderness.  Musculoskeletal:        General: No swelling.  Right lower leg: Edema present.     Left lower leg: Edema present.     Comments: Chronic lymphedema  Lymphadenopathy:     Cervical: No cervical adenopathy.  Skin:    General: Skin is warm and dry.  Neurological:     General: No focal deficit present.     Mental Status: She is alert and oriented to person, place, and time.  Psychiatric:        Mood and Affect: Mood normal.        Behavior: Behavior normal.     LABORATORY DATA:  I have reviewed the labs as listed.  CBC    Component Value Date/Time   WBC 6.5 05/29/2021 1251   RBC 3.74 (L) 05/29/2021 1251   HGB 12.1 05/29/2021 1251   HGB 10.8 (L) 09/25/2016 1518   HCT 38.1 05/29/2021 1251   HCT 32.6 (L) 09/25/2016 1518   PLT 191 05/29/2021 1251   PLT 264 09/25/2016 1518   MCV 101.9 (H) 05/29/2021 1251   MCV 96 09/25/2016 1518   MCH 32.4 05/29/2021 1251   MCHC 31.8 05/29/2021 1251   RDW 13.9 05/29/2021 1251   RDW 15.5 (H) 09/25/2016 1518   LYMPHSABS 1.6 05/29/2021 1251   LYMPHSABS 1.2 09/25/2016 1518   MONOABS 0.4 05/29/2021 1251   EOSABS 0.1 05/29/2021 1251   EOSABS 0.1 09/25/2016 1518   BASOSABS 0.0 05/29/2021 1251   BASOSABS 0.0 09/25/2016 1518   CMP Latest Ref Rng & Units 05/29/2021 02/04/2021 11/14/2020   Glucose 70 - 99 mg/dL 77 97 129(H)  BUN 8 - 23 mg/dL 10 16 9   Creatinine 0.44 - 1.00 mg/dL 0.62 0.62 0.59  Sodium 135 - 145 mmol/L 141 138 139  Potassium 3.5 - 5.1 mmol/L 4.0 4.4 3.2(L)  Chloride 98 - 111 mmol/L 108 108 106  CO2 22 - 32 mmol/L 25 25 24   Calcium 8.9 - 10.3 mg/dL 8.1(L) 8.3(L) 7.6(L)  Total Protein 6.5 - 8.1 g/dL 4.7(L) 5.9(L) 5.6(L)  Total Bilirubin 0.3 - 1.2 mg/dL 0.9 0.5 0.9  Alkaline Phos 38 - 126 U/L 66 76 58  AST 15 - 41 U/L 31 25 19   ALT 0 - 44 U/L 30 32 21    DIAGNOSTIC IMAGING:  I have independently reviewed the relevant imaging and discussed with the patient.  ASSESSMENT & PLAN: 1.  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  She reports that her energy improves after IV iron. -She denies any epistaxis, hematemesis, hematochezia, melena - Most recent labs (05/29/2021): Hgb 12.1 with mild macrocytosis (MCV 101.9), ferritin 263, iron saturation 91% with TIBC 122.  Normal B12 809, normal folate 23.5.  CMP unremarkable. - PLAN: No indication for IV iron at this time.  We will repeat labs with return visit in 6 months.     PLAN SUMMARY & DISPOSITION: -Labs and RTC in 6 months  All questions were answered. The patient knows to call the clinic with any problems, questions or concerns.  Medical decision making: Low  Time spent on visit: I spent 20 minutes counseling the patient face to face. The total  time spent in the appointment was 30 minutes and more than 50% was on  counseling.   Harriett Rush, PA-C  06/05/2021 2:26 PM

## 2021-06-05 ENCOUNTER — Encounter (HOSPITAL_COMMUNITY): Payer: Self-pay | Admitting: Physician Assistant

## 2021-06-05 ENCOUNTER — Inpatient Hospital Stay (HOSPITAL_BASED_OUTPATIENT_CLINIC_OR_DEPARTMENT_OTHER): Payer: Medicare Other | Admitting: Physician Assistant

## 2021-06-05 VITALS — BP 124/75 | HR 63 | Temp 97.8°F | Resp 18 | Wt 166.2 lb

## 2021-06-05 DIAGNOSIS — K909 Intestinal malabsorption, unspecified: Secondary | ICD-10-CM | POA: Diagnosis not present

## 2021-06-05 DIAGNOSIS — D500 Iron deficiency anemia secondary to blood loss (chronic): Secondary | ICD-10-CM

## 2021-06-05 DIAGNOSIS — D509 Iron deficiency anemia, unspecified: Secondary | ICD-10-CM | POA: Diagnosis not present

## 2021-06-05 NOTE — Patient Instructions (Signed)
[place] at [facility] Discharge Instructions  You were seen today by Tarri Abernethy PA-C for your iron deficiency anemia.  At this visit, your blood and iron levels both look great!  You do not need any IV iron treatment at this time.  We will recheck your levels and see you back in 6 months.  LABS: Return in 6 months for repeat labs  OTHER TESTS: No other tests needed at this time  MEDICATIONS: No changes to home medications  FOLLOW-UP APPOINTMENT: Office visit in 6 months   Thank you for choosing [place] at [facility] to provide your oncology and hematology care.  To afford each patient quality time with our provider, please arrive at least 15 minutes before your scheduled appointment time.   If you have a lab appointment with the [facility] please come in thru the Main Entrance and check in at the main information desk.  You need to re-schedule your appointment should you arrive 10 or more minutes late.  We strive to give you quality time with our providers, and arriving late affects you and other patients whose appointments are after yours.  Also, if you no show three or more times for appointments you may be dismissed from the clinic at the providers discretion.     Again, thank you for choosing [facility].  Our hope is that these requests will decrease the amount of time that you wait before being seen by our physicians.       _____________________________________________________________  Should you have questions after your visit to [facility], please contact our office at [phone] and follow the prompts.  Our office hours are 8:00 a.m. and 4:30 p.m. Monday - Friday.  Please note that voicemails left after 4:00 p.m. may not be returned until the following business day.  We are closed weekends and major holidays.  You do have access to a nurse 24-7, just call the main number to the clinic  [phone] and do not press any options, hold on the line and a nurse will answer the phone.    For prescription refill requests, have your pharmacy contact our office and allow 72 hours.    Due to Covid, you will need to wear a mask upon entering the hospital. If you do not have a mask, a mask will be given to you at the Main Entrance upon arrival. For doctor visits, patients may have 1 support person age 30 or older with them. For treatment visits, patients can not have anyone with them due to social distancing guidelines and our immunocompromised population.

## 2021-06-06 ENCOUNTER — Other Ambulatory Visit: Payer: Self-pay

## 2021-06-06 ENCOUNTER — Ambulatory Visit (HOSPITAL_COMMUNITY): Payer: Medicare Other | Admitting: Physical Therapy

## 2021-06-06 DIAGNOSIS — I890 Lymphedema, not elsewhere classified: Secondary | ICD-10-CM | POA: Diagnosis not present

## 2021-06-06 DIAGNOSIS — R262 Difficulty in walking, not elsewhere classified: Secondary | ICD-10-CM

## 2021-06-06 NOTE — Therapy (Signed)
[place], [place], [address] Phone: [phone]   Fax:  [phone]  Physical Therapy Treatment  Patient Details  Name: [name] MRN: [id number] Date of Birth: Aug 26, 1951 Referring Provider (PT): Stana Bunting   Encounter Date: 06/06/2021   PT End of Session - 06/06/21 1651     Visit Number 4    Number of Visits 18    Date for PT Re-Evaluation 07/09/21    Authorization Type UHC medicare    Progress Note Due on Visit 10    PT Start Time 1532    PT Stop Time 1647    PT Time Calculation (min) 75 min    Activity Tolerance Patient tolerated treatment well    Behavior During Therapy [facility] for tasks assessed/performed             Past Medical History:  Diagnosis Date   Asthma    GERD (gastroesophageal reflux disease)    Internal hemorrhoids    Iron deficiency anemia    Osteoporosis    Peptic ulcer    Tubular adenoma of colon    Vertigo     Past Surgical History:  Procedure Laterality Date   BACK SURGERY     BREAST SURGERY     CHOLECYSTECTOMY     COLONOSCOPY WITH PROPOFOL N/A 06/02/2017   Procedure: COLONOSCOPY WITH PROPOFOL;  Surgeon: Jonathon Bellows, MD;  Location: [facility] ENDOSCOPY;  Service: Gastroenterology;  Laterality: N/A;   COLONOSCOPY WITH PROPOFOL N/A 01/28/2021   Procedure: COLONOSCOPY WITH PROPOFOL;  Surgeon: Jonathon Bellows, MD;  Location: [facility] ENDOSCOPY;  Service: Gastroenterology;  Laterality: N/A;   ESOPHAGOGASTRODUODENOSCOPY (EGD) WITH PROPOFOL N/A 07/01/2017   Procedure: ESOPHAGOGASTRODUODENOSCOPY (EGD) WITH PROPOFOL;  Surgeon: Jonathon Bellows, MD;  Location: [facility] ENDOSCOPY;  Service: Gastroenterology;  Laterality: N/A;   ESOPHAGOGASTRODUODENOSCOPY (EGD) WITH PROPOFOL N/A 01/28/2021   Procedure: ESOPHAGOGASTRODUODENOSCOPY (EGD) WITH PROPOFOL;  Surgeon: Jonathon Bellows, MD;  Location: [facility] ENDOSCOPY;  Service: Gastroenterology;  Laterality: N/A;   FLEXIBLE SIGMOIDOSCOPY N/A 07/01/2017    Procedure: FLEXIBLE SIGMOIDOSCOPY;  Surgeon: Jonathon Bellows, MD;  Location: [facility] ENDOSCOPY;  Service: Gastroenterology;  Laterality: N/A;   KNEE SURGERY     mini gastric bypass     SHOULDER SURGERY     TONSILLECTOMY      There were no vitals filed for this visit.   Subjective Assessment - 06/06/21 1649     Subjective PT states she really wants to begin the bandaging to her thigh area.    Pertinent History lymphedema    Limitations Walking;Standing    How long can you stand comfortably? 5 minuts    How long can you walk comfortably? 5-10 minutes at the most    Patient Stated Goals to get her swelling down    Currently in Pain? No/denies    Pain Score 4     Pain Location Leg    Pain Orientation Left;Right    Pain Descriptors / Indicators Burning;Tightness    Pain Frequency Constant    Pain Relieving Factors rest                               [facility] Adult PT Treatment/Exercise - 06/06/21 0001       Manual Therapy   Manual Therapy Manual Lymphatic Drainage (MLD);Compression Bandaging    Manual therapy comments completed seperate from all other aspects    Manual Lymphatic Drainage (MLD) to include supraclavicular, deep and  superfical abdominal, inguinal axillary anastomosis followed by B LE  posterior completed prone,   Compression Bandaging Multilayer short stretch bandages with 1/2in foam to distal LE, 1/4" foam and multilayer short stretch  for thigh                       PT Short Term Goals - 05/28/21 1708       PT SHORT TERM GOAL #1   Title Pt to be I in self manual techniques.    Time 3    Period Weeks    Status New    Target Date 06/19/21      PT SHORT TERM GOAL #2   Title PT to have decreased 2-3 cm in thigh and leg, not foot to decrease risk of cellulitis.    Time 3    Period Weeks    Status New               PT Long Term Goals - 05/28/21 1708       PT LONG TERM GOAL #1   Title PT to have obtained an Ankle foot wrap and  verbalize that she is to wear the compression garment on a daily basis not just when she flares up    Time 6    Period Weeks    Status New    Target Date 07/10/21      PT LONG TERM GOAL #2   Title PT to have lost between 3-4 cm from thigh and leg measurements on average, not foot to decrease cellulitis and be able to fit into shoes and clothing easier.    Time 6    Period Weeks    Status New                   Plan - 06/06/21 1652     Clinical Impression Statement Pt  measured for capri's and given information shet.  PT anxious to get started with compression bandaging for her thighs therefore 1/4" foam was cut and bandaging began from toes to thigh high B.    Personal Factors and Comorbidities Comorbidity 2;Comorbidity 3+;Fitness;Time since onset of injury/illness/exacerbation    Comorbidities gastric bypass followed by plastic surgery to remove excessive skin as pt lost over 250#, OA,    Examination-Activity Limitations Dressing;Locomotion Level;Stand    Examination-Participation Restrictions Cleaning;Community Activity;Shop;Meal Prep    Stability/Clinical Decision Making Evolving/Moderate complexity    Rehab Potential Good    PT Frequency 3x / week    PT Duration 6 weeks    PT Treatment/Interventions Manual techniques;Therapeutic activities;Patient/family education;Manual lymph drainage;Therapeutic exercise    PT Next Visit Plan complete manual decongestive techniques and measure on WEdnesdays.    PT Home Exercise Plan ankle pump, LAQ, hip ab/adduction, march, diaphragmic breathing and lymphatic squeeze.    Consulted and Agree with Plan of Care Patient             Patient will benefit from skilled therapeutic intervention in order to improve the following deficits and impairments:  Pain, Difficulty walking, Increased edema  Visit Diagnosis: Lymphedema, not elsewhere classified  Difficulty in walking, not elsewhere classified     Problem List Patient Active  Problem List   Diagnosis Date Noted   Unilateral primary osteoarthritis, unspecified knee 05/06/2021   Acute exacerbation of chronic obstructive pulmonary disease (COPD) ([place]) 04/08/2021   History of anemia 04/08/2021   History of asthma 04/08/2021   History of COVID-19 04/08/2021   Shortness of breath  04/08/2021   Acute COVID-19 03/08/2021   Cough 03/08/2021   Fever 03/08/2021   Colon cancer screening 01/08/2021   Arthritis of right hand 11/15/2019   Age-related osteoporosis without current pathological fracture 10/30/2019   Left-sided weakness    Stroke-like symptoms 02/24/2018   HTN (hypertension) 02/24/2018   Primary osteoarthritis of both hands 11/03/2017   Dupuytren's contracture of left hand 11/03/2017   DDD (degenerative disc disease), lumbar 11/03/2017   History of gastric bypass 11/03/2017   Lymphedema 09/17/2017   History of vertebral fracture 09/17/2017   History of osteoporosis 09/17/2017   Iron deficiency anemia due to chronic blood loss 06/28/2017   Lower GI bleed 05/31/2017   Rectal mass 05/31/2017   Asthma 05/31/2017   GERD (gastroesophageal reflux disease) 05/31/2017   Closed fracture of upper end of humerus 09/23/2016   Tendinitis 09/23/2016   Patulous eustachian tube of right ear 11/07/2015   Superior semicircular canal dehiscence, bilateral 11/07/2015   Vitamin D deficiency 08/20/2014   Secondary hyperparathyroidism ([place]) 08/20/2014  [name], PT CLT [phone] , PT 06/06/2021, 4:53 PM  [place] [address] [place], [place], [address] Phone: [phone]   Fax:  [phone]  Name: [name] MRN: [id number] Date of Birth: May 22, 1951

## 2021-06-10 ENCOUNTER — Ambulatory Visit (HOSPITAL_COMMUNITY): Payer: Medicare Other

## 2021-06-11 ENCOUNTER — Ambulatory Visit (HOSPITAL_COMMUNITY): Payer: Medicare Other

## 2021-06-13 ENCOUNTER — Encounter (HOSPITAL_COMMUNITY): Payer: Self-pay

## 2021-06-13 ENCOUNTER — Ambulatory Visit (HOSPITAL_COMMUNITY): Payer: Medicare Other

## 2021-06-13 ENCOUNTER — Other Ambulatory Visit: Payer: Self-pay

## 2021-06-13 DIAGNOSIS — R262 Difficulty in walking, not elsewhere classified: Secondary | ICD-10-CM

## 2021-06-13 DIAGNOSIS — I890 Lymphedema, not elsewhere classified: Secondary | ICD-10-CM

## 2021-06-13 NOTE — Therapy (Signed)
[place], [place], [address] Phone: [phone]   Fax:  [phone]  Physical Therapy Treatment  Patient Details  Name: [name] MRN: [id number] Date of Birth: [birth year] Referring Provider (PT): Stana Bunting   Encounter Date: 06/13/2021   PT End of Session - 06/13/21 1014     Visit Number 5    Number of Visits 18    Date for PT Re-Evaluation 07/09/21    Authorization Type UHC medicare    Progress Note Due on Visit 10    PT Start Time [phone]    PT Stop Time 1000    PT Time Calculation (min) 85 min    Activity Tolerance Patient tolerated treatment well    Behavior During Therapy [facility] for tasks assessed/performed             Past Medical History:  Diagnosis Date   Asthma    GERD (gastroesophageal reflux disease)    Internal hemorrhoids    Iron deficiency anemia    Osteoporosis    Peptic ulcer    Tubular adenoma of colon    Vertigo     Past Surgical History:  Procedure Laterality Date   BACK SURGERY     BREAST SURGERY     CHOLECYSTECTOMY     COLONOSCOPY WITH PROPOFOL N/A 06/02/2017   Procedure: COLONOSCOPY WITH PROPOFOL;  Surgeon: Jonathon Bellows, MD;  Location: [facility] ENDOSCOPY;  Service: Gastroenterology;  Laterality: N/A;   COLONOSCOPY WITH PROPOFOL N/A 01/28/2021   Procedure: COLONOSCOPY WITH PROPOFOL;  Surgeon: Jonathon Bellows, MD;  Location: [facility] ENDOSCOPY;  Service: Gastroenterology;  Laterality: N/A;   ESOPHAGOGASTRODUODENOSCOPY (EGD) WITH PROPOFOL N/A 07/01/2017   Procedure: ESOPHAGOGASTRODUODENOSCOPY (EGD) WITH PROPOFOL;  Surgeon: Jonathon Bellows, MD;  Location: [facility] ENDOSCOPY;  Service: Gastroenterology;  Laterality: N/A;   ESOPHAGOGASTRODUODENOSCOPY (EGD) WITH PROPOFOL N/A 01/28/2021   Procedure: ESOPHAGOGASTRODUODENOSCOPY (EGD) WITH PROPOFOL;  Surgeon: Jonathon Bellows, MD;  Location: [facility] ENDOSCOPY;  Service: Gastroenterology;  Laterality: N/A;   FLEXIBLE SIGMOIDOSCOPY N/A 07/01/2017    Procedure: FLEXIBLE SIGMOIDOSCOPY;  Surgeon: Jonathon Bellows, MD;  Location: [facility] ENDOSCOPY;  Service: Gastroenterology;  Laterality: N/A;   KNEE SURGERY     mini gastric bypass     SHOULDER SURGERY     TONSILLECTOMY      There were no vitals filed for this visit.   Subjective Assessment - 06/13/21 1012     Subjective Pt missed apts earlier this week due to arrival of grandson.  Reports she has been using pump and wearing old compression garments except for Monday and has been on feet a lot this week.  Legs feel sore and tight.    Pertinent History lymphedema    Patient Stated Goals to get her swelling down    Currently in Pain? No/denies                   LYMPHEDEMA/ONCOLOGY QUESTIONNAIRE - 06/13/21 0001       Right Lower Extremity Lymphedema   20 cm Proximal to Suprapatella 59.6 cm   Initial eval on 05/28/21 was: 60.5   10 cm Proximal to Suprapatella 43.8 cm   Initial eval on 05/28/21 was: 56   At Midpatella/Popliteal Crease 45.3 cm   Initial eval on 05/28/21 was: 50.3   30 cm Proximal to Floor at Lateral Plantar Foot 45.5 cm   Initial eval on 05/28/21 was: 47.8   20 cm Proximal to Floor at Lateral Plantar Foot 42.7 1   Initial eval  on 05/28/21 was: 42.3   10 cm Proximal to Floor at Lateral Malleoli 34 cm   Initial eval on 05/28/21 was:  30.9   Circumference of ankle/heel 34.6 cm.   Initial eval on 05/28/21 was:  35   5 cm Proximal to 1st MTP Joint 23 cm   Initial eval on 05/28/21 was: 23   Across MTP Joint 22.8 cm   Initial eval on 05/28/21 was: 23.5     Left Lower Extremity Lymphedema   20 cm Proximal to Suprapatella 58.3 cm   Initial eval on 05/28/21 was: 60   10 cm Proximal to Suprapatella 54.9 cm   Initial eval on 05/28/21 was: 56.3   At Midpatella/Popliteal Crease 46.6 cm   Initial eval on 05/28/21 was:  49.9   30 cm Proximal to Floor at Lateral Plantar Foot 44.5 cm   Initial eval on 05/28/21 was:  46.5   20 cm Proximal to Floor at Lateral Plantar Foot 42.8 cm   Initial eval on  05/28/21 was:  41.3   10 cm Proximal to Floor at Lateral Malleoli 35 cm   Initial eval on 05/28/21 was: 31.3   Circumference of ankle/heel 34.5 cm.   Initial eval on 05/28/21 was: 36   5 cm Proximal to 1st MTP Joint 22.4 cm   Initial eval on 05/28/21 was: 23.9   Across MTP Joint 22.3 cm   Initial eval on 05/28/21 was: 24                       [facility] Adult PT Treatment/Exercise - 06/13/21 0001       Manual Therapy   Manual Therapy Manual Lymphatic Drainage (MLD);Compression Bandaging;Other (comment)    Manual therapy comments completed seperate from all other aspects    Manual Lymphatic Drainage (MLD) to include supraclavicular, deep and superfical abdominal, inguinal axillary anastomosis followed by B LE    Compression Bandaging Multilayer short stretch bandages with 1/2in foam to distal LE, 1/2" foam for thigh    Other Manual Therapy Measurements taken and instructed self manual techniques                       PT Short Term Goals - 05/28/21 1708       PT SHORT TERM GOAL #1   Title Pt to be I in self manual techniques.    Time 3    Period Weeks    Status New    Target Date 06/19/21      PT SHORT TERM GOAL #2   Title PT to have decreased 2-3 cm in thigh and leg, not foot to decrease risk of cellulitis.    Time 3    Period Weeks    Status New               PT Long Term Goals - 05/28/21 1708       PT LONG TERM GOAL #1   Title PT to have obtained an Ankle foot wrap and verbalize that she is to wear the compression garment on a daily basis not just when she flares up    Time 6    Period Weeks    Status New    Target Date 07/10/21      PT LONG TERM GOAL #2   Title PT to have lost between 3-4 cm from thigh and leg measurements on average, not foot to decrease cellulitis and be able to fit into shoes and  clothing easier.    Time 6    Period Weeks    Status New                   Plan - 06/13/21 1017     Clinical Impression Statement  Measurements taken with increased volume.  Reviewed importance of using pump and compression garments daily for lymphedema control.  Instructed self manual with handout given.  1/2" foam cut and applied to thighs.  Manual lymphedema decongestive techniques complete anterior only due to time restraints.    Personal Factors and Comorbidities Comorbidity 2;Comorbidity 3+;Fitness;Time since onset of injury/illness/exacerbation    Comorbidities gastric bypass followed by plastic surgery to remove excessive skin as pt lost over 250#, OA,    Examination-Activity Limitations Dressing;Locomotion Level;Stand    Examination-Participation Restrictions Cleaning;Community Activity;Shop;Meal Prep    Stability/Clinical Decision Making Evolving/Moderate complexity    Clinical Decision Making Moderate    Rehab Potential Good    PT Frequency 3x / week    PT Duration 6 weeks    PT Treatment/Interventions Manual techniques;Therapeutic activities;Patient/family education;Manual lymph drainage;Therapeutic exercise    PT Next Visit Plan complete manual decongestive techniques and measure on WEdnesdays.    PT Home Exercise Plan ankle pump, LAQ, hip ab/adduction, march, diaphragmic breathing and lymphatic squeeze.; self manual    Consulted and Agree with Plan of Care Patient             Patient will benefit from skilled therapeutic intervention in order to improve the following deficits and impairments:  Pain, Difficulty walking, Increased edema  Visit Diagnosis: Lymphedema, not elsewhere classified  Difficulty in walking, not elsewhere classified     Problem List Patient Active Problem List   Diagnosis Date Noted   Unilateral primary osteoarthritis, unspecified knee 05/06/2021   Acute exacerbation of chronic obstructive pulmonary disease (COPD) ([place]) 04/08/2021   History of anemia 04/08/2021   History of asthma 04/08/2021   History of COVID-19 04/08/2021   Shortness of breath 04/08/2021   Acute  COVID-19 03/08/2021   Cough 03/08/2021   Fever 03/08/2021   Colon cancer screening 01/08/2021   Arthritis of right hand 11/15/2019   Age-related osteoporosis without current pathological fracture 10/30/2019   Left-sided weakness    Stroke-like symptoms 02/24/2018   HTN (hypertension) 02/24/2018   Primary osteoarthritis of both hands 11/03/2017   Dupuytren's contracture of left hand 11/03/2017   DDD (degenerative disc disease), lumbar 11/03/2017   History of gastric bypass 11/03/2017   Lymphedema 09/17/2017   History of vertebral fracture 09/17/2017   History of osteoporosis 09/17/2017   Iron deficiency anemia due to chronic blood loss 06/28/2017   Lower GI bleed 05/31/2017   Rectal mass 05/31/2017   Asthma 05/31/2017   GERD (gastroesophageal reflux disease) 05/31/2017   Closed fracture of upper end of humerus 09/23/2016   Tendinitis 09/23/2016   Patulous eustachian tube of right ear 11/07/2015   Superior semicircular canal dehiscence, bilateral 11/07/2015   Vitamin D deficiency 08/20/2014   Secondary hyperparathyroidism ([place]) 08/20/2014   Ihor Austin, LPTA/CLT; CBIS [phone]  Aldona Lento, PTA 06/13/2021, 4:48 PM  [place] [address] [place], [place], [address] Phone: [phone]   Fax:  [phone]  Name: [name] MRN: [id number] Date of Birth: 1951/06/10

## 2021-06-16 ENCOUNTER — Ambulatory Visit (HOSPITAL_COMMUNITY): Payer: Medicare Other | Attending: Internal Medicine

## 2021-06-16 ENCOUNTER — Other Ambulatory Visit: Payer: Self-pay

## 2021-06-16 ENCOUNTER — Encounter (HOSPITAL_COMMUNITY): Payer: Self-pay

## 2021-06-16 DIAGNOSIS — I890 Lymphedema, not elsewhere classified: Secondary | ICD-10-CM | POA: Insufficient documentation

## 2021-06-16 DIAGNOSIS — R262 Difficulty in walking, not elsewhere classified: Secondary | ICD-10-CM | POA: Insufficient documentation

## 2021-06-16 NOTE — Therapy (Signed)
[place], [place], [address] Phone: [phone]   Fax:  [phone]  Physical Therapy Treatment  Patient Details  Name: [name] [place] MRN: [id number] Date of Birth: [birth year] Referring Provider (PT): Stana Bunting   Encounter Date: 06/16/2021   PT End of Session - 06/16/21 1515     Visit Number 6    Number of Visits 18    Date for PT Re-Evaluation 07/09/21    Authorization Type UHC medicare    Progress Note Due on Visit 10    PT Start Time 6967    PT Stop Time 1508    PT Time Calculation (min) 85 min    Activity Tolerance Patient tolerated treatment well    Behavior During Therapy [facility] for tasks assessed/performed             Past Medical History:  Diagnosis Date   Asthma    GERD (gastroesophageal reflux disease)    Internal hemorrhoids    Iron deficiency anemia    Osteoporosis    Peptic ulcer    Tubular adenoma of colon    Vertigo     Past Surgical History:  Procedure Laterality Date   BACK SURGERY     BREAST SURGERY     CHOLECYSTECTOMY     COLONOSCOPY WITH PROPOFOL N/A 06/02/2017   Procedure: COLONOSCOPY WITH PROPOFOL;  Surgeon: Jonathon Bellows, MD;  Location: [facility] ENDOSCOPY;  Service: Gastroenterology;  Laterality: N/A;   COLONOSCOPY WITH PROPOFOL N/A 01/28/2021   Procedure: COLONOSCOPY WITH PROPOFOL;  Surgeon: Jonathon Bellows, MD;  Location: [facility] ENDOSCOPY;  Service: Gastroenterology;  Laterality: N/A;   ESOPHAGOGASTRODUODENOSCOPY (EGD) WITH PROPOFOL N/A 07/01/2017   Procedure: ESOPHAGOGASTRODUODENOSCOPY (EGD) WITH PROPOFOL;  Surgeon: Jonathon Bellows, MD;  Location: [facility] ENDOSCOPY;  Service: Gastroenterology;  Laterality: N/A;   ESOPHAGOGASTRODUODENOSCOPY (EGD) WITH PROPOFOL N/A 01/28/2021   Procedure: ESOPHAGOGASTRODUODENOSCOPY (EGD) WITH PROPOFOL;  Surgeon: Jonathon Bellows, MD;  Location: [facility] ENDOSCOPY;  Service: Gastroenterology;  Laterality: N/A;   FLEXIBLE SIGMOIDOSCOPY N/A 07/01/2017    Procedure: FLEXIBLE SIGMOIDOSCOPY;  Surgeon: Jonathon Bellows, MD;  Location: [facility] ENDOSCOPY;  Service: Gastroenterology;  Laterality: N/A;   KNEE SURGERY     mini gastric bypass     SHOULDER SURGERY     TONSILLECTOMY      There were no vitals filed for this visit.   Subjective Assessment - 06/16/21 1514     Subjective Pt stated her legs feel heavy today, no reports of pain currently.    Pertinent History lymphedema    Patient Stated Goals to get her swelling down    Currently in Pain? No/denies                               [facility] Adult PT Treatment/Exercise - 06/16/21 0001       Manual Therapy   Manual Therapy Manual Lymphatic Drainage (MLD);Compression Bandaging;Other (comment)    Manual therapy comments completed seperate from all other aspects    Manual Lymphatic Drainage (MLD) to include supraclavicular, deep and superfical abdominal, inguinal axillary anastomosis followed by B LE    Compression Bandaging Multilayer short stretch bandages with 1/2in foam to distal LE, 1/2" foam for thigh                       PT Short Term Goals - 05/28/21 1708       PT SHORT TERM GOAL #1  Title Pt to be I in self manual techniques.    Time 3    Period Weeks    Status New    Target Date 06/19/21      PT SHORT TERM GOAL #2   Title PT to have decreased 2-3 cm in thigh and leg, not foot to decrease risk of cellulitis.    Time 3    Period Weeks    Status New               PT Long Term Goals - 05/28/21 1708       PT LONG TERM GOAL #1   Title PT to have obtained an Ankle foot wrap and verbalize that she is to wear the compression garment on a daily basis not just when she flares up    Time 6    Period Weeks    Status New    Target Date 07/10/21      PT LONG TERM GOAL #2   Title PT to have lost between 3-4 cm from thigh and leg measurements on average, not foot to decrease cellulitis and be able to fit into shoes and clothing easier.    Time 6     Period Weeks    Status New                   Plan - 06/16/21 1516     Clinical Impression Statement Minimal induration, mainly around Bil mallelous.  Manual lymphedema decongestive techniques complete to anterior and posterior LE today.  Appliction of multilayer short stretch bandages complete to BLE thighs with additional 1/2in foam, reoprts of comfort at EOS.    Personal Factors and Comorbidities Comorbidity 2;Comorbidity 3+;Fitness;Time since onset of injury/illness/exacerbation    Comorbidities gastric bypass followed by plastic surgery to remove excessive skin as pt lost over 250#, OA,    Examination-Activity Limitations Dressing;Locomotion Level;Stand    Examination-Participation Restrictions Cleaning;Community Activity;Shop;Meal Prep    Stability/Clinical Decision Making Evolving/Moderate complexity    Clinical Decision Making Moderate    Rehab Potential Good    PT Frequency 3x / week    PT Duration 6 weeks    PT Treatment/Interventions Manual techniques;Therapeutic activities;Patient/family education;Manual lymph drainage;Therapeutic exercise    PT Next Visit Plan complete manual decongestive techniques and measure on WEdnesdays.    PT Home Exercise Plan ankle pump, LAQ, hip ab/adduction, march, diaphragmic breathing and lymphatic squeeze.; self manual    Consulted and Agree with Plan of Care Patient             Patient will benefit from skilled therapeutic intervention in order to improve the following deficits and impairments:  Pain, Difficulty walking, Increased edema  Visit Diagnosis: Lymphedema, not elsewhere classified  Difficulty in walking, not elsewhere classified     Problem List Patient Active Problem List   Diagnosis Date Noted   Unilateral primary osteoarthritis, unspecified knee 05/06/2021   Acute exacerbation of chronic obstructive pulmonary disease (COPD) ([place]) 04/08/2021   History of anemia 04/08/2021   History of asthma 04/08/2021    History of COVID-19 04/08/2021   Shortness of breath 04/08/2021   Acute COVID-19 03/08/2021   Cough 03/08/2021   Fever 03/08/2021   Colon cancer screening 01/08/2021   Arthritis of right hand 11/15/2019   Age-related osteoporosis without current pathological fracture 10/30/2019   Left-sided weakness    Stroke-like symptoms 02/24/2018   HTN (hypertension) 02/24/2018   Primary osteoarthritis of both hands 11/03/2017   Dupuytren's contracture of left hand  11/03/2017   DDD (degenerative disc disease), lumbar 11/03/2017   History of gastric bypass 11/03/2017   Lymphedema 09/17/2017   History of vertebral fracture 09/17/2017   History of osteoporosis 09/17/2017   Iron deficiency anemia due to chronic blood loss 06/28/2017   Lower GI bleed 05/31/2017   Rectal mass 05/31/2017   Asthma 05/31/2017   GERD (gastroesophageal reflux disease) 05/31/2017   Closed fracture of upper end of humerus 09/23/2016   Tendinitis 09/23/2016   Patulous eustachian tube of right ear 11/07/2015   Superior semicircular canal dehiscence, bilateral 11/07/2015   Vitamin D deficiency 08/20/2014   Secondary hyperparathyroidism ([place]) 08/20/2014   Ihor Austin, LPTA/CLT; CBIS [phone]  Aldona Lento, PTA 06/16/2021, 3:19 PM  [place] [address] [place], [place], [address] Phone: [phone]   Fax:  [phone]  Name: [name] [place] MRN: [id number] Date of Birth: 1951/03/27

## 2021-06-18 ENCOUNTER — Ambulatory Visit (HOSPITAL_COMMUNITY): Payer: Medicare Other

## 2021-06-18 ENCOUNTER — Other Ambulatory Visit: Payer: Self-pay

## 2021-06-18 DIAGNOSIS — I890 Lymphedema, not elsewhere classified: Secondary | ICD-10-CM

## 2021-06-18 DIAGNOSIS — R262 Difficulty in walking, not elsewhere classified: Secondary | ICD-10-CM

## 2021-06-18 NOTE — Therapy (Signed)
[place], [place], [address] Phone: [phone]   Fax:  [phone]  Physical Therapy Treatment  Patient Details  Name: [name] MRN: [id number] Date of Birth: [birth year] Referring Provider (PT): Stana Bunting   Encounter Date: 06/18/2021   PT End of Session - 06/18/21 1442     Visit Number 7    Number of Visits 18    Date for PT Re-Evaluation 07/09/21    Authorization Type UHC medicare    Progress Note Due on Visit 10    PT Start Time 1316    PT Stop Time 1430    PT Time Calculation (min) 74 min    Activity Tolerance Patient tolerated treatment well    Behavior During Therapy [facility] for tasks assessed/performed             Past Medical History:  Diagnosis Date   Asthma    GERD (gastroesophageal reflux disease)    Internal hemorrhoids    Iron deficiency anemia    Osteoporosis    Peptic ulcer    Tubular adenoma of colon    Vertigo     Past Surgical History:  Procedure Laterality Date   BACK SURGERY     BREAST SURGERY     CHOLECYSTECTOMY     COLONOSCOPY WITH PROPOFOL N/A 06/02/2017   Procedure: COLONOSCOPY WITH PROPOFOL;  Surgeon: Jonathon Bellows, MD;  Location: [facility] ENDOSCOPY;  Service: Gastroenterology;  Laterality: N/A;   COLONOSCOPY WITH PROPOFOL N/A 01/28/2021   Procedure: COLONOSCOPY WITH PROPOFOL;  Surgeon: Jonathon Bellows, MD;  Location: [facility] ENDOSCOPY;  Service: Gastroenterology;  Laterality: N/A;   ESOPHAGOGASTRODUODENOSCOPY (EGD) WITH PROPOFOL N/A 07/01/2017   Procedure: ESOPHAGOGASTRODUODENOSCOPY (EGD) WITH PROPOFOL;  Surgeon: Jonathon Bellows, MD;  Location: [facility] ENDOSCOPY;  Service: Gastroenterology;  Laterality: N/A;   ESOPHAGOGASTRODUODENOSCOPY (EGD) WITH PROPOFOL N/A 01/28/2021   Procedure: ESOPHAGOGASTRODUODENOSCOPY (EGD) WITH PROPOFOL;  Surgeon: Jonathon Bellows, MD;  Location: [facility] ENDOSCOPY;  Service: Gastroenterology;  Laterality: N/A;   FLEXIBLE SIGMOIDOSCOPY N/A 07/01/2017    Procedure: FLEXIBLE SIGMOIDOSCOPY;  Surgeon: Jonathon Bellows, MD;  Location: [facility] ENDOSCOPY;  Service: Gastroenterology;  Laterality: N/A;   KNEE SURGERY     mini gastric bypass     SHOULDER SURGERY     TONSILLECTOMY      There were no vitals filed for this visit.   Subjective Assessment - 06/18/21 1434     Subjective Pt stated its easier to bent knees.  Wore dressing until late last night and removed prior bed time to make it to husband's MD apt in [place] this morning.    Pertinent History lymphedema    Patient Stated Goals to get her swelling down    Currently in Pain? No/denies                   LYMPHEDEMA/ONCOLOGY QUESTIONNAIRE - 06/18/21 0001       Right Lower Extremity Lymphedema   20 cm Proximal to Suprapatella 60.8 cm   on initial eval 05/28/21 was: 60.5   10 cm Proximal to Suprapatella 53 cm   on initial eval 05/28/21 was: 56   At Midpatella/Popliteal Crease 44.9 cm   on initial eval 05/28/21 was: 50.3   30 cm Proximal to Floor at Lateral Plantar Foot 42.7 cm   on initial eval 05/28/21 was: 47.8   20 cm Proximal to Floor at Lateral Plantar Foot 37.5 1   on initial eval 05/28/21 was: 42.3   10 cm Proximal to Floor  at Lateral Malleoli 29.5 cm   on initial eval 05/28/21 was: 30.9   Circumference of ankle/heel 34 cm.   on initial eval 05/28/21 was: 35   5 cm Proximal to 1st MTP Joint 22.8 cm   on initial eval 05/28/21 was: 23   Across MTP Joint 22.5 cm   on initial eval 05/28/21 was: 23.5     Left Lower Extremity Lymphedema   20 cm Proximal to Suprapatella 57.5 cm   on initial eval 05/28/21 was: 60   10 cm Proximal to Suprapatella 52.3 cm   on initial eval 05/28/21 was: 56.3   At Midpatella/Popliteal Crease 46.4 cm   on initial eval 05/28/21 was: 49.9   30 cm Proximal to Floor at Lateral Plantar Foot 42.5 cm   on initial eval 05/28/21 was: 46.5   20 cm Proximal to Floor at Lateral Plantar Foot 38 cm   on initial eval 05/28/21 was: 41.3   10 cm Proximal to Floor at Lateral  Malleoli 29.6 cm   on initial eval 05/28/21 was: 31.3   Circumference of ankle/heel 34.3 cm.   on initial eval 05/28/21 was: 36   5 cm Proximal to 1st MTP Joint 23.2 cm   on initial eval 05/28/21 was: 23.9   Across MTP Joint 22.2 cm   on initial eval 05/28/21 was: 24                       [facility] Adult PT Treatment/Exercise - 06/18/21 0001       Manual Therapy   Manual Therapy Manual Lymphatic Drainage (MLD);Compression Bandaging;Other (comment)    Manual therapy comments completed seperate from all other aspects    Manual Lymphatic Drainage (MLD) to include supraclavicular, deep and superfical abdominal, inguinal axillary anastomosis followed by B LE    Compression Bandaging Multilayer short stretch bandages with 1/2in foam to distal LE, 1/2" foam for thigh    Other Manual Therapy Measurements taken and instructed self manual techniques                       PT Short Term Goals - 05/28/21 1708       PT SHORT TERM GOAL #1   Title Pt to be I in self manual techniques.    Time 3    Period Weeks    Status New    Target Date 06/19/21      PT SHORT TERM GOAL #2   Title PT to have decreased 2-3 cm in thigh and leg, not foot to decrease risk of cellulitis.    Time 3    Period Weeks    Status New               PT Long Term Goals - 05/28/21 1708       PT LONG TERM GOAL #1   Title PT to have obtained an Ankle foot wrap and verbalize that she is to wear the compression garment on a daily basis not just when she flares up    Time 6    Period Weeks    Status New    Target Date 07/10/21      PT LONG TERM GOAL #2   Title PT to have lost between 3-4 cm from thigh and leg measurements on average, not foot to decrease cellulitis and be able to fit into shoes and clothing easier.    Time 6    Period Weeks    Status  New                   Plan - 06/18/21 1442     Clinical Impression Statement Manual lymphedema decongestive techniques complete to  anterior only due to time restraints wiht measurements. Increased focus on BLE mallelous as most congestion there.  Reports of comfort following application of multilayer short stretch bandages with 1/2in foam to thigh.    Personal Factors and Comorbidities Comorbidity 2;Comorbidity 3+;Fitness;Time since onset of injury/illness/exacerbation    Comorbidities gastric bypass followed by plastic surgery to remove excessive skin as pt lost over 250#, OA,    Examination-Activity Limitations Dressing;Locomotion Level;Stand    Examination-Participation Restrictions Cleaning;Community Activity;Shop;Meal Prep    Stability/Clinical Decision Making Evolving/Moderate complexity    Clinical Decision Making Moderate    Rehab Potential Good    PT Frequency 3x / week    PT Duration 6 weeks    PT Treatment/Interventions Manual techniques;Therapeutic activities;Patient/family education;Manual lymph drainage;Therapeutic exercise    PT Next Visit Plan complete manual decongestive techniques and measure on WEdnesdays.    PT Home Exercise Plan ankle pump, LAQ, hip ab/adduction, march, diaphragmic breathing and lymphatic squeeze.; self manual             Patient will benefit from skilled therapeutic intervention in order to improve the following deficits and impairments:  Pain, Difficulty walking, Increased edema  Visit Diagnosis: Lymphedema, not elsewhere classified  Difficulty in walking, not elsewhere classified     Problem List Patient Active Problem List   Diagnosis Date Noted   Unilateral primary osteoarthritis, unspecified knee 05/06/2021   Acute exacerbation of chronic obstructive pulmonary disease (COPD) ([place]) 04/08/2021   History of anemia 04/08/2021   History of asthma 04/08/2021   History of COVID-19 04/08/2021   Shortness of breath 04/08/2021   Acute COVID-19 03/08/2021   Cough 03/08/2021   Fever 03/08/2021   Colon cancer screening 01/08/2021   Arthritis of right hand 11/15/2019    Age-related osteoporosis without current pathological fracture 10/30/2019   Left-sided weakness    Stroke-like symptoms 02/24/2018   HTN (hypertension) 02/24/2018   Primary osteoarthritis of both hands 11/03/2017   Dupuytren's contracture of left hand 11/03/2017   DDD (degenerative disc disease), lumbar 11/03/2017   History of gastric bypass 11/03/2017   Lymphedema 09/17/2017   History of vertebral fracture 09/17/2017   History of osteoporosis 09/17/2017   Iron deficiency anemia due to chronic blood loss 06/28/2017   Lower GI bleed 05/31/2017   Rectal mass 05/31/2017   Asthma 05/31/2017   GERD (gastroesophageal reflux disease) 05/31/2017   Closed fracture of upper end of humerus 09/23/2016   Tendinitis 09/23/2016   Patulous eustachian tube of right ear 11/07/2015   Superior semicircular canal dehiscence, bilateral 11/07/2015   Vitamin D deficiency 08/20/2014   Secondary hyperparathyroidism ([place]) 08/20/2014   Ihor Austin, LPTA/CLT; CBIS [phone]  Aldona Lento, PTA 06/18/2021, 2:46 PM  [place] [address] [place], [place], [address] Phone: [phone]   Fax:  [phone]  Name: Lahna Ryals Burns MRN: [id number] Date of Birth: 1951-05-02

## 2021-06-20 ENCOUNTER — Other Ambulatory Visit: Payer: Self-pay

## 2021-06-20 ENCOUNTER — Ambulatory Visit (HOSPITAL_COMMUNITY): Payer: Medicare Other

## 2021-06-20 ENCOUNTER — Encounter (HOSPITAL_COMMUNITY): Payer: Self-pay

## 2021-06-20 DIAGNOSIS — I890 Lymphedema, not elsewhere classified: Secondary | ICD-10-CM

## 2021-06-20 DIAGNOSIS — R262 Difficulty in walking, not elsewhere classified: Secondary | ICD-10-CM

## 2021-06-20 NOTE — Therapy (Signed)
[place], [place], [address] Phone: [phone]   Fax:  [phone]  Physical Therapy Treatment  Patient Details  Name: [name] MRN: [id number] Date of Birth: 06-Apr-1951 Referring Provider (PT): Stana Bunting   Encounter Date: 06/20/2021   PT End of Session - 06/20/21 1738     Visit Number 8    Number of Visits 18    Date for PT Re-Evaluation 07/09/21    Authorization Type UHC medicare    Progress Note Due on Visit 10    PT Start Time 1620    PT Stop Time 1735    PT Time Calculation (min) 75 min    Activity Tolerance Patient tolerated treatment well    Behavior During Therapy [facility] for tasks assessed/performed             Past Medical History:  Diagnosis Date   Asthma    GERD (gastroesophageal reflux disease)    Internal hemorrhoids    Iron deficiency anemia    Osteoporosis    Peptic ulcer    Tubular adenoma of colon    Vertigo     Past Surgical History:  Procedure Laterality Date   BACK SURGERY     BREAST SURGERY     CHOLECYSTECTOMY     COLONOSCOPY WITH PROPOFOL N/A 06/02/2017   Procedure: COLONOSCOPY WITH PROPOFOL;  Surgeon: Jonathon Bellows, MD;  Location: [facility] ENDOSCOPY;  Service: Gastroenterology;  Laterality: N/A;   COLONOSCOPY WITH PROPOFOL N/A 01/28/2021   Procedure: COLONOSCOPY WITH PROPOFOL;  Surgeon: Jonathon Bellows, MD;  Location: [facility] ENDOSCOPY;  Service: Gastroenterology;  Laterality: N/A;   ESOPHAGOGASTRODUODENOSCOPY (EGD) WITH PROPOFOL N/A 07/01/2017   Procedure: ESOPHAGOGASTRODUODENOSCOPY (EGD) WITH PROPOFOL;  Surgeon: Jonathon Bellows, MD;  Location: [facility] ENDOSCOPY;  Service: Gastroenterology;  Laterality: N/A;   ESOPHAGOGASTRODUODENOSCOPY (EGD) WITH PROPOFOL N/A 01/28/2021   Procedure: ESOPHAGOGASTRODUODENOSCOPY (EGD) WITH PROPOFOL;  Surgeon: Jonathon Bellows, MD;  Location: [facility] ENDOSCOPY;  Service: Gastroenterology;  Laterality: N/A;   FLEXIBLE SIGMOIDOSCOPY N/A 07/01/2017    Procedure: FLEXIBLE SIGMOIDOSCOPY;  Surgeon: Jonathon Bellows, MD;  Location: [facility] ENDOSCOPY;  Service: Gastroenterology;  Laterality: N/A;   KNEE SURGERY     mini gastric bypass     SHOULDER SURGERY     TONSILLECTOMY      There were no vitals filed for this visit.   Subjective Assessment - 06/20/21 1737     Subjective Pt stated with a smile, "my legs jiggle now".  Stated she had to remove dressings from Lt LE Wednesday night due to ankle pain.  Feeling good today.    Pertinent History lymphedema    Patient Stated Goals to get her swelling down    Currently in Pain? No/denies                               [facility] Adult PT Treatment/Exercise - 06/20/21 0001       Manual Therapy   Manual Therapy Manual Lymphatic Drainage (MLD);Compression Bandaging;Other (comment)    Manual therapy comments completed seperate from all other aspects    Manual Lymphatic Drainage (MLD) to include supraclavicular, deep and superfical abdominal, inguinal axillary anastomosis followed by B LE    Compression Bandaging Multilayer short stretch bandages with 1/2in foam to distal LE, 1/2" foam for thigh                       PT Short Term  Goals - 05/28/21 1708       PT SHORT TERM GOAL #1   Title Pt to be I in self manual techniques.    Time 3    Period Weeks    Status New    Target Date 06/19/21      PT SHORT TERM GOAL #2   Title PT to have decreased 2-3 cm in thigh and leg, not foot to decrease risk of cellulitis.    Time 3    Period Weeks    Status New               PT Long Term Goals - 05/28/21 1708       PT LONG TERM GOAL #1   Title PT to have obtained an Ankle foot wrap and verbalize that she is to wear the compression garment on a daily basis not just when she flares up    Time 6    Period Weeks    Status New    Target Date 07/10/21      PT LONG TERM GOAL #2   Title PT to have lost between 3-4 cm from thigh and leg measurements on average, not foot to  decrease cellulitis and be able to fit into shoes and clothing easier.    Time 6    Period Weeks    Status New                   Plan - 06/20/21 1739     Clinical Impression Statement Manual decongestive technqiues complete anterior and posterior today.  Induration only at Bil malleoli this session. Added orange dense foam to medial and lateral malleoli as this area continues to have most induration.  Some foam trimmed for better fit prior application of multilayer short stretch dressings.  Reports of comfort at EOS.    Personal Factors and Comorbidities Comorbidity 2;Comorbidity 3+;Fitness;Time since onset of injury/illness/exacerbation    Comorbidities gastric bypass followed by plastic surgery to remove excessive skin as pt lost over 250#, OA,    Examination-Activity Limitations Dressing;Locomotion Level;Stand    Examination-Participation Restrictions Cleaning;Community Activity;Shop;Meal Prep    Stability/Clinical Decision Making Evolving/Moderate complexity    Clinical Decision Making Moderate    Rehab Potential Good    PT Frequency 3x / week    PT Duration 6 weeks    PT Treatment/Interventions Manual techniques;Therapeutic activities;Patient/family education;Manual lymph drainage;Therapeutic exercise    PT Next Visit Plan complete manual decongestive techniques and measure on WEdnesdays.    PT Home Exercise Plan ankle pump, LAQ, hip ab/adduction, march, diaphragmic breathing and lymphatic squeeze.; self manual             Patient will benefit from skilled therapeutic intervention in order to improve the following deficits and impairments:  Pain, Difficulty walking, Increased edema  Visit Diagnosis: Lymphedema, not elsewhere classified  Difficulty in walking, not elsewhere classified     Problem List Patient Active Problem List   Diagnosis Date Noted   Unilateral primary osteoarthritis, unspecified knee 05/06/2021   Acute exacerbation of chronic obstructive  pulmonary disease (COPD) ([place]) 04/08/2021   History of anemia 04/08/2021   History of asthma 04/08/2021   History of COVID-19 04/08/2021   Shortness of breath 04/08/2021   Acute COVID-19 03/08/2021   Cough 03/08/2021   Fever 03/08/2021   Colon cancer screening 01/08/2021   Arthritis of right hand 11/15/2019   Age-related osteoporosis without current pathological fracture 10/30/2019   Left-sided weakness    Stroke-like symptoms  02/24/2018   HTN (hypertension) 02/24/2018   Primary osteoarthritis of both hands 11/03/2017   Dupuytren's contracture of left hand 11/03/2017   DDD (degenerative disc disease), lumbar 11/03/2017   History of gastric bypass 11/03/2017   Lymphedema 09/17/2017   History of vertebral fracture 09/17/2017   History of osteoporosis 09/17/2017   Iron deficiency anemia due to chronic blood loss 06/28/2017   Lower GI bleed 05/31/2017   Rectal mass 05/31/2017   Asthma 05/31/2017   GERD (gastroesophageal reflux disease) 05/31/2017   Closed fracture of upper end of humerus 09/23/2016   Tendinitis 09/23/2016   Patulous eustachian tube of right ear 11/07/2015   Superior semicircular canal dehiscence, bilateral 11/07/2015   Vitamin D deficiency 08/20/2014   Secondary hyperparathyroidism ([place]) 08/20/2014   Ihor Austin, LPTA/CLT; CBIS [phone]  Aldona Lento, PTA 06/20/2021, 5:43 PM  [place] [address] [place], [place], [address] Phone: [phone]   Fax:  [phone]  Name: Danijela Ryals Burns MRN: [id number] Date of Birth: 06-03-1951

## 2021-06-23 ENCOUNTER — Other Ambulatory Visit: Payer: Self-pay

## 2021-06-23 ENCOUNTER — Ambulatory Visit (HOSPITAL_COMMUNITY): Payer: Medicare Other | Admitting: Physical Therapy

## 2021-06-23 DIAGNOSIS — I890 Lymphedema, not elsewhere classified: Secondary | ICD-10-CM | POA: Diagnosis not present

## 2021-06-23 DIAGNOSIS — R262 Difficulty in walking, not elsewhere classified: Secondary | ICD-10-CM

## 2021-06-23 NOTE — Therapy (Signed)
[place], [place], [address] Phone: [phone]   Fax:  [phone]  Physical Therapy Treatment  Patient Details  Name: [name] MRN: [id number] Date of Birth: August 18, 1951 Referring Provider (PT): Stana Bunting   Encounter Date: 06/23/2021   PT End of Session - 06/23/21 1622     Visit Number 9    Number of Visits 18    Date for PT Re-Evaluation 07/09/21    Authorization Type UHC medicare    Authorization Time Period cert period 6/14-43/15    Progress Note Due on Visit 10    PT Start Time 1406    PT Stop Time 1530    PT Time Calculation (min) 84 min    Activity Tolerance Patient tolerated treatment well    Behavior During Therapy [facility] for tasks assessed/performed             Past Medical History:  Diagnosis Date   Asthma    GERD (gastroesophageal reflux disease)    Internal hemorrhoids    Iron deficiency anemia    Osteoporosis    Peptic ulcer    Tubular adenoma of colon    Vertigo     Past Surgical History:  Procedure Laterality Date   BACK SURGERY     BREAST SURGERY     CHOLECYSTECTOMY     COLONOSCOPY WITH PROPOFOL N/A 06/02/2017   Procedure: COLONOSCOPY WITH PROPOFOL;  Surgeon: Jonathon Bellows, MD;  Location: [facility] ENDOSCOPY;  Service: Gastroenterology;  Laterality: N/A;   COLONOSCOPY WITH PROPOFOL N/A 01/28/2021   Procedure: COLONOSCOPY WITH PROPOFOL;  Surgeon: Jonathon Bellows, MD;  Location: [facility] ENDOSCOPY;  Service: Gastroenterology;  Laterality: N/A;   ESOPHAGOGASTRODUODENOSCOPY (EGD) WITH PROPOFOL N/A 07/01/2017   Procedure: ESOPHAGOGASTRODUODENOSCOPY (EGD) WITH PROPOFOL;  Surgeon: Jonathon Bellows, MD;  Location: [facility] ENDOSCOPY;  Service: Gastroenterology;  Laterality: N/A;   ESOPHAGOGASTRODUODENOSCOPY (EGD) WITH PROPOFOL N/A 01/28/2021   Procedure: ESOPHAGOGASTRODUODENOSCOPY (EGD) WITH PROPOFOL;  Surgeon: Jonathon Bellows, MD;  Location: [facility] ENDOSCOPY;  Service: Gastroenterology;  Laterality:  N/A;   FLEXIBLE SIGMOIDOSCOPY N/A 07/01/2017   Procedure: FLEXIBLE SIGMOIDOSCOPY;  Surgeon: Jonathon Bellows, MD;  Location: [facility] ENDOSCOPY;  Service: Gastroenterology;  Laterality: N/A;   KNEE SURGERY     mini gastric bypass     SHOULDER SURGERY     TONSILLECTOMY      There were no vitals filed for this visit.   Subjective Assessment - 06/23/21 1621     Subjective pt states she is doing well today.  Can tell her legs are getting better.    Currently in Pain? No/denies                               [facility] Adult PT Treatment/Exercise - 06/23/21 0001       Manual Therapy   Manual Therapy Manual Lymphatic Drainage (MLD);Compression Bandaging;Other (comment)    Manual therapy comments completed seperate from all other aspects    Manual Lymphatic Drainage (MLD) to include supraclavicular, deep and superfical abdominal, inguinal axillary anastomosis followed by B LE    Compression Bandaging Multilayer short stretch bandages with 1/2in foam to distal LE, 1/2" foam for thigh                       PT Short Term Goals - 05/28/21 1708       PT SHORT TERM GOAL #1   Title Pt to be I  in self manual techniques.    Time 3    Period Weeks    Status New    Target Date 06/19/21      PT SHORT TERM GOAL #2   Title PT to have decreased 2-3 cm in thigh and leg, not foot to decrease risk of cellulitis.    Time 3    Period Weeks    Status New               PT Long Term Goals - 05/28/21 1708       PT LONG TERM GOAL #1   Title PT to have obtained an Ankle foot wrap and verbalize that she is to wear the compression garment on a daily basis not just when she flares up    Time 6    Period Weeks    Status New    Target Date 07/10/21      PT LONG TERM GOAL #2   Title PT to have lost between 3-4 cm from thigh and leg measurements on average, not foot to decrease cellulitis and be able to fit into shoes and clothing easier.    Time 6    Period Weeks     Status New                   Plan - 06/23/21 1624     Clinical Impression Statement Ankles continue to be most conjested area but reducing overall with use of orange foam.  Pt with questions regarding garments if she should order more.  Educated/demonstrated use of sock butler and requested she bring her stockings next visit to see if she is able to get them on using that.  Will most likely need her spouse to help get it on the wire device but she shoudl be able to manage afterwards.  No induration noted today except around ankle area.  Overall improving.    Personal Factors and Comorbidities Comorbidity 2;Comorbidity 3+;Fitness;Time since onset of injury/illness/exacerbation    Comorbidities gastric bypass followed by plastic surgery to remove excessive skin as pt lost over 250#, OA,    Examination-Activity Limitations Dressing;Locomotion Level;Stand    Examination-Participation Restrictions Cleaning;Community Activity;Shop;Meal Prep    Stability/Clinical Decision Making Evolving/Moderate complexity    Rehab Potential Good    PT Frequency 3x / week    PT Duration 6 weeks    PT Treatment/Interventions Manual techniques;Therapeutic activities;Patient/family education;Manual lymph drainage;Therapeutic exercise    PT Next Visit Plan complete manual decongestive techniques and measure on WEdnesdays.  Complete 10th visit PN and work on Water quality scientist next session.    PT Home Exercise Plan ankle pump, LAQ, hip ab/adduction, march, diaphragmic breathing and lymphatic squeeze.; self manual             Patient will benefit from skilled therapeutic intervention in order to improve the following deficits and impairments:  Pain, Difficulty walking, Increased edema  Visit Diagnosis: Lymphedema, not elsewhere classified  Difficulty in walking, not elsewhere classified     Problem List Patient Active Problem List   Diagnosis Date Noted   Unilateral primary osteoarthritis, unspecified  knee 05/06/2021   Acute exacerbation of chronic obstructive pulmonary disease (COPD) ([place]) 04/08/2021   History of anemia 04/08/2021   History of asthma 04/08/2021   History of COVID-19 04/08/2021   Shortness of breath 04/08/2021   Acute COVID-19 03/08/2021   Cough 03/08/2021   Fever 03/08/2021   Colon cancer screening 01/08/2021   Arthritis of right hand 11/15/2019  Age-related osteoporosis without current pathological fracture 10/30/2019   Left-sided weakness    Stroke-like symptoms 02/24/2018   HTN (hypertension) 02/24/2018   Primary osteoarthritis of both hands 11/03/2017   Dupuytren's contracture of left hand 11/03/2017   DDD (degenerative disc disease), lumbar 11/03/2017   History of gastric bypass 11/03/2017   Lymphedema 09/17/2017   History of vertebral fracture 09/17/2017   History of osteoporosis 09/17/2017   Iron deficiency anemia due to chronic blood loss 06/28/2017   Lower GI bleed 05/31/2017   Rectal mass 05/31/2017   Asthma 05/31/2017   GERD (gastroesophageal reflux disease) 05/31/2017   Closed fracture of upper end of humerus 09/23/2016   Tendinitis 09/23/2016   Patulous eustachian tube of right ear 11/07/2015   Superior semicircular canal dehiscence, bilateral 11/07/2015   Vitamin D deficiency 08/20/2014   Secondary hyperparathyroidism ([place]) 08/20/2014   Teena Irani, PTA/CLT, WTA [phone]  Teena Irani, PTA 06/23/2021, 4:28 PM  [place] [address] [place], [place], [address] Phone: [phone]   Fax:  [phone]  Name: [name] MRN: [id number] Date of Birth: 1951/06/05

## 2021-06-25 ENCOUNTER — Other Ambulatory Visit: Payer: Self-pay

## 2021-06-25 ENCOUNTER — Encounter (HOSPITAL_COMMUNITY): Payer: Self-pay | Admitting: Physical Therapy

## 2021-06-25 ENCOUNTER — Ambulatory Visit (HOSPITAL_COMMUNITY): Payer: Medicare Other | Admitting: Physical Therapy

## 2021-06-25 DIAGNOSIS — R262 Difficulty in walking, not elsewhere classified: Secondary | ICD-10-CM

## 2021-06-25 DIAGNOSIS — I890 Lymphedema, not elsewhere classified: Secondary | ICD-10-CM

## 2021-06-25 NOTE — Therapy (Signed)
[facility] [address] [place], [place], [address] Phone: [phone]   Fax:  [phone]  Physical Therapy Treatment  Patient Details  Name: [name] MRN: [id number] Date of Birth: 19-Sep-1950 Referring Provider (PT): Stana Bunting Progress Note Reporting Period 9/14 to 10/12  See note below for Objective Data and Assessment of Progress/Goals.      Encounter Date: 06/25/2021   PT End of Session - 06/25/21 1541     Visit Number 10    Number of Visits 18    Date for PT Re-Evaluation 07/09/21    Authorization Type UHC medicare    Authorization Time Period cert period 9/15-05/69    Progress Note Due on Visit 18    PT Start Time 1537    PT Stop Time 1658    PT Time Calculation (min) 81 min    Activity Tolerance Patient tolerated treatment well    Behavior During Therapy WFL for tasks assessed/performed             Past Medical History:  Diagnosis Date   Asthma    GERD (gastroesophageal reflux disease)    Internal hemorrhoids    Iron deficiency anemia    Osteoporosis    Peptic ulcer    Tubular adenoma of colon    Vertigo     Past Surgical History:  Procedure Laterality Date   BACK SURGERY     BREAST SURGERY     CHOLECYSTECTOMY     COLONOSCOPY WITH PROPOFOL N/A 06/02/2017   Procedure: COLONOSCOPY WITH PROPOFOL;  Surgeon: Jonathon Bellows, MD;  [facility] END[facility]COPY;  Service: Gastroenterology;  Laterality: N/A;   COLONOSCOPY WITH PROPOFOL N/A 01/28/2021   Procedure: COLONOSCOPY WITH PROPOFOL;  Surgeon: Jonathon Bellows, MD;  Location: [facility] ENDOSCOPY;  Service: Gastroenterology;  Laterality: N/A;   ESOPHAGOGASTRODUODENOSCOPY (EGD) WITH PROPOFOL N/A 07/01/2017   Procedure: ESOPHAGOGASTRODUODENOSCOPY (EGD) WITH PROPOFOL;  Surgeon: Jonathon Bellows, MD;  Location: [facility] ENDOSCOPY;  Service: Gastroenterology;  Laterality: N/A;   ESOPHAGOGASTRODUODENOSCOPY (EGD) WITH PROPOFOL N/A 01/28/2021   Procedure:  ESOPHAGOGASTRODUODENOSCOPY (EGD) WITH PROPOFOL;  Surgeon: Jonathon Bellows, MD;  Location: [facility] ENDOSCOPY;  Service: Gastroenterology;  Laterality: N/A;   FLEXIBLE SIGMOIDOSCOPY N/A 07/01/2017   Procedure: FLEXIBLE SIGMOIDOSCOPY;  Surgeon: Jonathon Bellows, MD;  Location: [facility] ENDOSCOPY;  Service: Gastroenterology;  Laterality: N/A;   KNEE SURGERY     mini gastric bypass     SHOULDER SURGERY     TONSILLECTOMY      There were no vitals filed for this visit.   Subjective Assessment - 06/25/21 1704     Subjective PT brought her custom garments that were given to her seven years ago.  She states that her legs were smaller back then.  Pt will not be able to wear these garments.  Therapist allowed pt to try and don her garment on which pt feels she will be able to do with the butler.    Pertinent History lymphedema    Limitations Walking;Standing    How long can you stand comfortably? 5 minuts    How long can you walk comfortably? 5-10 minutes at the most                   LYMPHEDEMA/ONCOLOGY QUESTIONNAIRE - 06/25/21 0001       Right Lower Extremity Lymphedema   20 cm Proximal to Suprapatella 59.4 cm   60.5   10 cm Proximal to Suprapatella 52.5 cm   56   At Midpatella/Popliteal Crease 46  cm   50.3   30 cm Proximal to Floor at Lateral Plantar Foot 43.8 cm   47.8   20 cm Proximal to Floor at Lateral Plantar Foot 37.3 1   42.9   10 cm Proximal to Floor at Lateral Malleoli 28.5 cm   30.9   Circumference of ankle/heel 33.8 cm.   35   5 cm Proximal to 1st MTP Joint 22.7 cm   23   Across MTP Joint 22.5 cm   23.5     Left Lower Extremity Lymphedema   20 cm Proximal to Suprapatella 57.6 cm   was 60   10 cm Proximal to Suprapatella 52 cm   56.3   At Midpatella/Popliteal Crease 44.8 cm   49.9   30 cm Proximal to Floor at Lateral Plantar Foot 43.7 cm   46.5   20 cm Proximal to Floor at Lateral Plantar Foot 37.3 cm   41.3   10 cm Proximal to Floor at Lateral Malleoli 28.8 cm   31.3    Circumference of ankle/heel 35.2 cm.   36   5 cm Proximal to 1st MTP Joint 22.3 cm   23.9   Across MTP Joint 22.4 cm   24                       [facility] Adult PT Treatment/Exercise - 06/25/21 0001       Manual Therapy   Manual Therapy Manual Lymphatic Drainage (MLD);Compression Bandaging;Other (comment)    Manual therapy comments completed seperate from all other aspects    Manual Lymphatic Drainage (MLD) to include supraclavicular, deep and superfical abdominal, inguinal axillary anastomosis followed by B LE    Compression Bandaging Multilayer short stretch bandages with 1/2in foam to distal LE, 1/2" foam for thigh    Other Manual Therapy measurement.                       PT Short Term Goals - 06/25/21 1713       PT SHORT TERM GOAL #1   Title Pt to be I in self manual techniques.    Time 3    Period Weeks    Status On-going      PT SHORT TERM GOAL #2   Title PT to have decreased 2-3 cm in thigh and leg, not foot to decrease risk of cellulitis.    Time 3    Period Weeks    Status Achieved               PT Long Term Goals - 06/25/21 1714       PT LONG TERM GOAL #1   Title PT to have obtained an Ankle foot wrap and verbalize that she is to wear the compression garment on a daily basis not just when she flares up    Time 6    Period Weeks    Status On-going   PT desires to go with a compression garment rather than juxtafit     PT LONG TERM GOAL #2   Title PT to have lost between 3-4 cm from thigh and leg measurements on average, not foot to decrease cellulitis and be able to fit into shoes and clothing easier.    Time 6    Period Weeks    Status Partially Met                   Plan - 06/25/21 1707  Clinical Impression Statement Used both orange and regular foam on ankles to attempt to decrease edema.  Gave pt a self bandaging sheet and reviewed how to self bandage.  Attempted use of butler with her custom garment without  success but able to don  a size Lg 20-30 thigh high off the shelf, pt actually needs xl therefore this should be easier.    Personal Factors and Comorbidities Comorbidity 2;Comorbidity 3+;Fitness;Time since onset of injury/illness/exacerbation    Comorbidities gastric bypass followed by plastic surgery to remove excessive skin as pt lost over 250#, OA,    Examination-Activity Limitations Dressing;Locomotion Level;Stand    Examination-Participation Restrictions Cleaning;Community Activity;Shop;Meal Prep    Stability/Clinical Decision Making Evolving/Moderate complexity    Rehab Potential Good    PT Frequency 3x / week    PT Duration 6 weeks    PT Treatment/Interventions Manual techniques;Therapeutic activities;Patient/family education;Manual lymph drainage;Therapeutic exercise    PT Next Visit Plan complete manual decongestive techniques and measure on WEdnesdays.    PT Home Exercise Plan ankle pump, LAQ, hip ab/adduction, march, diaphragmic breathing and lymphatic squeeze.; self manual             Patient will benefit from skilled therapeutic intervention in order to improve the following deficits and impairments:  Pain, Difficulty walking, Increased edema  Visit Diagnosis: Lymphedema, not elsewhere classified  Difficulty in walking, not elsewhere classified     Problem List Patient Active Problem List   Diagnosis Date Noted   Unilateral primary osteoarthritis, unspecified knee 05/06/2021   Acute exacerbation of chronic obstructive pulmonary disease (COPD) ([place]) 04/08/2021   History of anemia 04/08/2021   History of asthma 04/08/2021   History of COVID-19 04/08/2021   Shortness of breath 04/08/2021   Acute COVID-19 03/08/2021   Cough 03/08/2021   Fever 03/08/2021   Colon cancer screening 01/08/2021   Arthritis of right hand 11/15/2019   Age-related osteoporosis without current pathological fracture 10/30/2019   Left-sided weakness    Stroke-like symptoms 02/24/2018   HTN  (hypertension) 02/24/2018   Primary osteoarthritis of both hands 11/03/2017   Dupuytren's contracture of left hand 11/03/2017   DDD (degenerative disc disease), lumbar 11/03/2017   History of gastric bypass 11/03/2017   Lymphedema 09/17/2017   History of vertebral fracture 09/17/2017   History of osteoporosis 09/17/2017   Iron deficiency anemia due to chronic blood loss 06/28/2017   Lower GI bleed 05/31/2017   Rectal mass 05/31/2017   Asthma 05/31/2017   GERD (gastroesophageal reflux disease) 05/31/2017   Closed fracture of upper end of humerus 09/23/2016   Tendinitis 09/23/2016   Patulous eustachian tube of right ear 11/07/2015   Superior semicircular canal dehiscence, bilateral 11/07/2015   Vitamin D deficiency 08/20/2014   Secondary hyperparathyroidism ([place]) 08/20/2014    [name], PT CLT [phone]  06/25/2021, 5:16 PM  [facility] [address] [place], [place], [address] Phone: [phone]   Fax:  [phone]  Name: [name] MRN: [id number] Date of Birth: [birth year]

## 2021-06-27 ENCOUNTER — Ambulatory Visit (HOSPITAL_COMMUNITY): Payer: Medicare Other

## 2021-06-27 ENCOUNTER — Other Ambulatory Visit: Payer: Self-pay

## 2021-06-27 ENCOUNTER — Encounter (HOSPITAL_COMMUNITY): Payer: Self-pay

## 2021-06-27 DIAGNOSIS — R262 Difficulty in walking, not elsewhere classified: Secondary | ICD-10-CM

## 2021-06-27 DIAGNOSIS — I890 Lymphedema, not elsewhere classified: Secondary | ICD-10-CM | POA: Diagnosis not present

## 2021-06-27 NOTE — Therapy (Signed)
[facility] [place], [place], [address] Phone: [phone]   Fax:  [phone]  Physical Therapy Treatment  Patient Details  Name: [name] MRN: [id number] Date of Birth: 06/06/1951 Referring Provider (PT): Stana Bunting   Encounter Date: 06/27/2021   PT End of Session - 06/27/21 1523     Visit Number 11    Number of Visits 18    Date for PT Re-Evaluation 07/09/21    Authorization Type UHC medicare    Authorization Time Period cert period 4/58-59/29    Progress Note Due on Visit 18    PT Start Time 1403    PT Stop Time 1515    PT Time Calculation (min) 72 min    Activity Tolerance Patient tolerated treatment well    Behavior During Therapy [facility] for tasks assessed/performed             Past Medical History:  Diagnosis Date   Asthma    GERD (gastroesophageal reflux disease)    Internal hemorrhoids    Iron deficiency anemia    Osteoporosis    Peptic ulcer    Tubular adenoma of colon    Vertigo     Past Surgical History:  Procedure Laterality Date   BACK SURGERY     BREAST SURGERY     CHOLECYSTECTOMY     COLONOSCOPY WITH PROPOFOL N/A 06/02/2017   Procedure: COLONOSCOPY WITH PROPOFOL;  Surgeon: Jonathon Bellows, MD;  Location: [facility] ENDOSCOPY;  Service: Gastroenterology;  Laterality: N/A;   COLONOSCOPY WITH PROPOFOL N/A 01/28/2021   Procedure: COLONOSCOPY WITH PROPOFOL;  Surgeon: Jonathon Bellows, MD;  Location: [facility] ENDOSCOPY;  Service: Gastroenterology;  Laterality: N/A;   ESOPHAGOGASTRODUODENOSCOPY (EGD) WITH PROPOFOL N/A 07/01/2017   Procedure: ESOPHAGOGASTRODUODENOSCOPY (EGD) WITH PROPOFOL;  Surgeon: Jonathon Bellows, MD;  Location: [facility] ENDOSCOPY;  Service: Gastroenterology;  Laterality: N/A;   ESOPHAGOGASTRODUODENOSCOPY (EGD) WITH PROPOFOL N/A 01/28/2021   Procedure: ESOPHAGOGASTRODUODENOSCOPY (EGD) WITH PROPOFOL;  Surgeon: Jonathon Bellows, MD;  Location: [facility] ENDOSCOPY;  Service: Gastroenterology;  Laterality:  N/A;   FLEXIBLE SIGMOIDOSCOPY N/A 07/01/2017   Procedure: FLEXIBLE SIGMOIDOSCOPY;  Surgeon: Jonathon Bellows, MD;  Location: [facility] ENDOSCOPY;  Service: Gastroenterology;  Laterality: N/A;   KNEE SURGERY     mini gastric bypass     SHOULDER SURGERY     TONSILLECTOMY      There were no vitals filed for this visit.   Subjective Assessment - 06/27/21 1521     Subjective Pt stated her legs feel very "jiggley" today.  STated she plans to purchase XL soon, requested copy of measurements.    Pertinent History lymphedema    Patient Stated Goals to get her swelling down    Currently in Pain? No/denies                               [facility] Adult PT Treatment/Exercise - 06/27/21 0001       Manual Therapy   Manual Therapy Manual Lymphatic Drainage (MLD);Compression Bandaging;Other (comment)    Manual therapy comments completed seperate from all other aspects    Manual Lymphatic Drainage (MLD) to include supraclavicular, deep and superfical abdominal, inguinal axillary anastomosis followed by B LE    Compression Bandaging Multilayer short stretch bandages with 1/2in foam to distal LE, 1/2" foam for thigh    Other Manual Therapy given copy of measurement to purchase compression garment  PT Short Term Goals - 06/25/21 1713       PT SHORT TERM GOAL #1   Title Pt to be I in self manual techniques.    Time 3    Period Weeks    Status On-going      PT SHORT TERM GOAL #2   Title PT to have decreased 2-3 cm in thigh and leg, not foot to decrease risk of cellulitis.    Time 3    Period Weeks    Status Achieved               PT Long Term Goals - 06/25/21 1714       PT LONG TERM GOAL #1   Title PT to have obtained an Ankle foot wrap and verbalize that she is to wear the compression garment on a daily basis not just when she flares up    Time 6    Period Weeks    Status On-going   PT desires to go with a compression garment rather than  juxtafit     PT LONG TERM GOAL #2   Title PT to have lost between 3-4 cm from thigh and leg measurements on average, not foot to decrease cellulitis and be able to fit into shoes and clothing easier.    Time 6    Period Weeks    Status Partially Met                   Plan - 06/27/21 1523     Clinical Impression Statement Manual decongestive technqiues complete anterior and posterior for inginual to axillary anastomoses.  Application of multilayer short stretch bandages complete wiht 1/2in foam thigh high.  Noted some reduction in edema malleolis with additional orange and grey foam, does continues to be more swollen.  Pt requested copy of measurements, plans to purchase XL compression garments soon.  Did report she will only be avaiable next week for 2 days due to vacation wiht family.  Review importance of wear compression to reduce increased edema.    Personal Factors and Comorbidities Comorbidity 2;Comorbidity 3+;Fitness;Time since onset of injury/illness/exacerbation    Comorbidities gastric bypass followed by plastic surgery to remove excessive skin as pt lost over 250#, OA,    Examination-Activity Limitations Dressing;Locomotion Level;Stand    Examination-Participation Restrictions Cleaning;Community Activity;Shop;Meal Prep    Stability/Clinical Decision Making Evolving/Moderate complexity    Clinical Decision Making Moderate    Rehab Potential Good    PT Frequency 3x / week    PT Duration 6 weeks    PT Treatment/Interventions Manual techniques;Therapeutic activities;Patient/family education;Manual lymph drainage;Therapeutic exercise    PT Next Visit Plan complete manual decongestive techniques and measure on WEdnesdays.    PT Home Exercise Plan ankle pump, LAQ, hip ab/adduction, march, diaphragmic breathing and lymphatic squeeze.; self manual    Consulted and Agree with Plan of Care Patient             Patient will benefit from skilled therapeutic intervention in  order to improve the following deficits and impairments:  Pain, Difficulty walking, Increased edema  Visit Diagnosis: Lymphedema, not elsewhere classified  Difficulty in walking, not elsewhere classified     Problem List Patient Active Problem List   Diagnosis Date Noted   Unilateral primary osteoarthritis, unspecified knee 05/06/2021   Acute exacerbation of chronic obstructive pulmonary disease (COPD) ([place]) 04/08/2021   History of anemia 04/08/2021   History of asthma 04/08/2021   History of COVID-19 04/08/2021   Shortness  of breath 04/08/2021   Acute COVID-19 03/08/2021   Cough 03/08/2021   Fever 03/08/2021   Colon cancer screening 01/08/2021   Arthritis of right hand 11/15/2019   Age-related osteoporosis without current pathological fracture 10/30/2019   Left-sided weakness    Stroke-like symptoms 02/24/2018   HTN (hypertension) 02/24/2018   Primary osteoarthritis of both hands 11/03/2017   Dupuytren's contracture of left hand 11/03/2017   DDD (degenerative disc disease), lumbar 11/03/2017   History of gastric bypass 11/03/2017   Lymphedema 09/17/2017   History of vertebral fracture 09/17/2017   History of osteoporosis 09/17/2017   Iron deficiency anemia due to chronic blood loss 06/28/2017   Lower GI bleed 05/31/2017   Rectal mass 05/31/2017   Asthma 05/31/2017   GERD (gastroesophageal reflux disease) 05/31/2017   Closed fracture of upper end of humerus 09/23/2016   Tendinitis 09/23/2016   Patulous eustachian tube of right ear 11/07/2015   Superior semicircular canal dehiscence, bilateral 11/07/2015   Vitamin D deficiency 08/20/2014   Secondary hyperparathyroidism ([place]) 08/20/2014   Ihor Austin, LPTA/CLT; CBIS [phone]  Aldona Lento, PTA 06/27/2021, 3:28 PM  [facility] [address] [place], [place], [address] Phone: [phone]   Fax:  [phone]  Name: Heavenleigh Ryals Burns MRN: [id number] Date  of Birth: 10/18/1950

## 2021-06-30 ENCOUNTER — Ambulatory Visit (HOSPITAL_COMMUNITY): Payer: Medicare Other | Admitting: Physical Therapy

## 2021-06-30 ENCOUNTER — Other Ambulatory Visit: Payer: Self-pay

## 2021-06-30 DIAGNOSIS — I890 Lymphedema, not elsewhere classified: Secondary | ICD-10-CM

## 2021-06-30 DIAGNOSIS — R262 Difficulty in walking, not elsewhere classified: Secondary | ICD-10-CM

## 2021-06-30 NOTE — Therapy (Signed)
[place], [place], [address] Phone: [phone]   Fax:  [phone]  Physical Therapy Treatment  Patient Details  Name: [name] MRN: [id number] Date of Birth: 04/04/1951 Referring Provider (PT): [name]   Encounter Date: 06/30/2021   PT End of Session - 06/30/21 1602     Visit Number 12    Number of Visits 18    Date for PT Re-Evaluation 07/09/21    Authorization Type UHC medicare    Authorization Time Period cert period 5/92-92/44    Progress Note Due on Visit 18    PT Start Time 1050    PT Stop Time 1205    PT Time Calculation (min) 75 min    Activity Tolerance Patient tolerated treatment well    Behavior During Therapy [facility] for tasks assessed/performed             Past Medical History:  Diagnosis Date   Asthma    GERD (gastroesophageal reflux disease)    Internal hemorrhoids    Iron deficiency anemia    Osteoporosis    Peptic ulcer    Tubular adenoma of colon    Vertigo     Past Surgical History:  Procedure Laterality Date   BACK SURGERY     BREAST SURGERY     CHOLECYSTECTOMY     COLONOSCOPY WITH PROPOFOL N/A 06/02/2017   Procedure: COLONOSCOPY WITH PROPOFOL;  Surgeon: Jonathon Bellows, MD;  Location: [facility] ENDOSCOPY;  Service: Gastroenterology;  Laterality: N/A;   COLONOSCOPY WITH PROPOFOL N/A 01/28/2021   Procedure: COLONOSCOPY WITH PROPOFOL;  Surgeon: Jonathon Bellows, MD;  Location: [facility] ENDOSCOPY;  Service: Gastroenterology;  Laterality: N/A;   ESOPHAGOGASTRODUODENOSCOPY (EGD) WITH PROPOFOL N/A 07/01/2017   Procedure: ESOPHAGOGASTRODUODENOSCOPY (EGD) WITH PROPOFOL;  Surgeon: Jonathon Bellows, MD;  Location: [facility] ENDOSCOPY;  Service: Gastroenterology;  Laterality: N/A;   ESOPHAGOGASTRODUODENOSCOPY (EGD) WITH PROPOFOL N/A 01/28/2021   Procedure: ESOPHAGOGASTRODUODENOSCOPY (EGD) WITH PROPOFOL;  Surgeon: Jonathon Bellows, MD;  Location: [facility] ENDOSCOPY;  Service: Gastroenterology;  Laterality:  N/A;   FLEXIBLE SIGMOIDOSCOPY N/A 07/01/2017   Procedure: FLEXIBLE SIGMOIDOSCOPY;  Surgeon: Jonathon Bellows, MD;  Location: [facility] ENDOSCOPY;  Service: Gastroenterology;  Laterality: N/A;   KNEE SURGERY     mini gastric bypass     SHOULDER SURGERY     TONSILLECTOMY      There were no vitals filed for this visit.   Subjective Assessment - 06/30/21 1601     Subjective Pt states she will be unable to come on Friday as [place].  States she does need an appointment MOnday if anything comes available. pt ask if we can put less wrap around her foot.    Currently in Pain? No/denies                               [facility] Adult PT Treatment/Exercise - 06/30/21 0001       Manual Therapy   Manual Therapy Manual Lymphatic Drainage (MLD);Compression Bandaging;Other (comment)    Manual therapy comments completed seperate from all other aspects    Manual Lymphatic Drainage (MLD) to include supraclavicular, deep and superfical abdominal, inguinal axillary anastomosis followed by B LE    Compression Bandaging Multilayer short stretch bandages with 1/2in foam to distal LE, 1/2" foam for thigh  PT Short Term Goals - 06/25/21 1713       PT SHORT TERM GOAL #1   Title Pt to be I in self manual techniques.    Time 3    Period Weeks    Status On-going      PT SHORT TERM GOAL #2   Title PT to have decreased 2-3 cm in thigh and leg, not foot to decrease risk of cellulitis.    Time 3    Period Weeks    Status Achieved               PT Long Term Goals - 06/25/21 1714       PT LONG TERM GOAL #1   Title PT to have obtained an Ankle foot wrap and verbalize that she is to wear the compression garment on a daily basis not just when she flares up    Time 6    Period Weeks    Status On-going   PT desires to go with a compression garment rather than juxtafit     PT LONG TERM GOAL #2   Title PT to have lost between 3-4 cm from  thigh and leg measurements on average, not foot to decrease cellulitis and be able to fit into shoes and clothing easier.    Time 6    Period Weeks    Status Partially Met                   Plan - 06/30/21 1736     Clinical Impression Statement Pt has ordered new garments for LE and is overall doing well with new orange foam at ankles.  Manual completed followed by moisturizing LE's and bandaging to thigh level.  Less bandage around forefoot per request as becomes uncomfortable.  PT reported overall comfort at end of session.    Personal Factors and Comorbidities Comorbidity 2;Comorbidity 3+;Fitness;Time since onset of injury/illness/exacerbation    Comorbidities gastric bypass followed by plastic surgery to remove excessive skin as pt lost over 250#, OA,    Examination-Activity Limitations Dressing;Locomotion Level;Stand    Examination-Participation Restrictions Cleaning;Community Activity;Shop;Meal Prep    Stability/Clinical Decision Making Evolving/Moderate complexity    Rehab Potential Good    PT Frequency 3x / week    PT Duration 6 weeks    PT Treatment/Interventions Manual techniques;Therapeutic activities;Patient/family education;Manual lymph drainage;Therapeutic exercise    PT Next Visit Plan complete manual decongestive techniques and measure on WEdnesdays.    PT Home Exercise Plan ankle pump, LAQ, hip ab/adduction, march, diaphragmic breathing and lymphatic squeeze.; self manual    Consulted and Agree with Plan of Care Patient             Patient will benefit from skilled therapeutic intervention in order to improve the following deficits and impairments:  Pain, Difficulty walking, Increased edema  Visit Diagnosis: Lymphedema, not elsewhere classified  Difficulty in walking, not elsewhere classified     Problem List Patient Active Problem List   Diagnosis Date Noted   Unilateral primary osteoarthritis, unspecified knee 05/06/2021   Acute exacerbation of  chronic obstructive pulmonary disease (COPD) ([facility]) 04/08/2021   History of anemia 04/08/2021   History of asthma 04/08/2021   History of COVID-19 04/08/2021   Shortness of breath 04/08/2021   Acute COVID-19 03/08/2021   Cough 03/08/2021   Fever 03/08/2021   Colon cancer screening 01/08/2021   Arthritis of right hand 11/15/2019   Age-related osteoporosis without current pathological fracture 10/30/2019   Left-sided weakness  Stroke-like symptoms 02/24/2018   HTN (hypertension) 02/24/2018   Primary osteoarthritis of both hands 11/03/2017   Dupuytren's contracture of left hand 11/03/2017   DDD (degenerative disc disease), lumbar 11/03/2017   History of gastric bypass 11/03/2017   Lymphedema 09/17/2017   History of vertebral fracture 09/17/2017   History of osteoporosis 09/17/2017   Iron deficiency anemia due to chronic blood loss 06/28/2017   Lower GI bleed 05/31/2017   Rectal mass 05/31/2017   Asthma 05/31/2017   GERD (gastroesophageal reflux disease) 05/31/2017   Closed fracture of upper end of humerus 09/23/2016   Tendinitis 09/23/2016   Patulous eustachian tube of right ear 11/07/2015   Superior semicircular canal dehiscence, 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 [place], [place], [address] Phone: [phone]   Fax:  [phone]  Name: [name] MRN: [id number] Date of Birth: [birth year]

## 2021-07-02 ENCOUNTER — Encounter (HOSPITAL_COMMUNITY): Payer: Self-pay

## 2021-07-02 ENCOUNTER — Ambulatory Visit (HOSPITAL_COMMUNITY): Payer: Medicare Other

## 2021-07-02 ENCOUNTER — Other Ambulatory Visit: Payer: Self-pay

## 2021-07-02 DIAGNOSIS — R262 Difficulty in walking, not elsewhere classified: Secondary | ICD-10-CM

## 2021-07-02 DIAGNOSIS — I890 Lymphedema, not elsewhere classified: Secondary | ICD-10-CM | POA: Diagnosis not present

## 2021-07-02 NOTE — Therapy (Signed)
[place], [place], [address] Phone: [phone]   Fax:  [phone]  Physical Therapy Treatment  Patient Details  Name: [name] MRN: [id number] Date of Birth: 03-Feb-1951 Referring Provider (PT): Stana Bunting   Encounter Date: 07/02/2021   PT End of Session - 07/02/21 1717     Visit Number 13    Number of Visits 18    Date for PT Re-Evaluation 07/09/21    Authorization Type UHC medicare    Authorization Time Period cert period 4/16-60/63    Progress Note Due on Visit 18    PT Start Time 1540   late arrival   PT Stop Time 1700    PT Time Calculation (min) 80 min    Activity Tolerance Patient tolerated treatment well    Behavior During Therapy [facility] for tasks assessed/performed             Past Medical History:  Diagnosis Date   Asthma    GERD (gastroesophageal reflux disease)    Internal hemorrhoids    Iron deficiency anemia    Osteoporosis    Peptic ulcer    Tubular adenoma of colon    Vertigo     Past Surgical History:  Procedure Laterality Date   BACK SURGERY     BREAST SURGERY     CHOLECYSTECTOMY     COLONOSCOPY WITH PROPOFOL N/A 06/02/2017   Procedure: COLONOSCOPY WITH PROPOFOL;  Surgeon: Jonathon Bellows, MD;  Location: [facility] ENDOSCOPY;  Service: Gastroenterology;  Laterality: N/A;   COLONOSCOPY WITH PROPOFOL N/A 01/28/2021   Procedure: COLONOSCOPY WITH PROPOFOL;  Surgeon: Jonathon Bellows, MD;  Location: [facility] ENDOSCOPY;  Service: Gastroenterology;  Laterality: N/A;   ESOPHAGOGASTRODUODENOSCOPY (EGD) WITH PROPOFOL N/A 07/01/2017   Procedure: ESOPHAGOGASTRODUODENOSCOPY (EGD) WITH PROPOFOL;  Surgeon: Jonathon Bellows, MD;  Location: [facility] ENDOSCOPY;  Service: Gastroenterology;  Laterality: N/A;   ESOPHAGOGASTRODUODENOSCOPY (EGD) WITH PROPOFOL N/A 01/28/2021   Procedure: ESOPHAGOGASTRODUODENOSCOPY (EGD) WITH PROPOFOL;  Surgeon: Jonathon Bellows, MD;  Location: [facility] ENDOSCOPY;  Service: Gastroenterology;   Laterality: N/A;   FLEXIBLE SIGMOIDOSCOPY N/A 07/01/2017   Procedure: FLEXIBLE SIGMOIDOSCOPY;  Surgeon: Jonathon Bellows, MD;  Location: [facility] ENDOSCOPY;  Service: Gastroenterology;  Laterality: N/A;   KNEE SURGERY     mini gastric bypass     SHOULDER SURGERY     TONSILLECTOMY      There were no vitals filed for this visit.   Subjective Assessment - 07/02/21 1707     Subjective Pt arrived with new thigh high sleeper slimmer compression capris and reports she really likes them but did require assistance from husband to get on.  She brought in new butler and thigh high compression garment 15-82mmHg and reports she is unable to put on with use of butler due to hand weakness and wrist pain.  Reports she has pain to the bone today, maybe related to cold weather.  Stated husband had heart procedure this morning at [facility], had to sit for 4 hours waiting.  Reports increased foot comfort with decreased pressure following last session.    Pertinent History lymphedema    Patient Stated Goals to get her swelling down    Currently in Pain? Yes    Pain Score 8     Pain Location Generalized   Bone pain   Pain Descriptors / Indicators Aching                   LYMPHEDEMA/ONCOLOGY QUESTIONNAIRE - 07/02/21 0001  Right Lower Extremity Lymphedema   20 cm Proximal to Suprapatella 57 cm   on 10th visit 10/12: 59.4   10 cm Proximal to Suprapatella 52 cm   on 10th visit 10/12: 52.5   At Midpatella/Popliteal Crease 44.5 cm   on 10th visit 10/12: 46   30 cm Proximal to Floor at Lateral Plantar Foot 42.5 cm   on 10th visit 10/12: 43.8   20 cm Proximal to Floor at Lateral Plantar Foot 40.5 1   on 10th visit 10/12: 37.3   10 cm Proximal to Floor at Lateral Malleoli 31.5 cm   on 10th visit 10/12: 28.5   Circumference of ankle/heel 33 cm.   on 10th visit 10/12: 33.8   5 cm Proximal to 1st MTP Joint 22.7 cm   on 10th visit 10/12: 22.7   Across MTP Joint 22 cm   on 10th visit 10/12: 22.5     Left  Lower Extremity Lymphedema   20 cm Proximal to Suprapatella 57.6 cm   on 10th visit 10/12: 57.6   10 cm Proximal to Suprapatella 52 cm   on 10th visit 10/12: 52   At Midpatella/Popliteal Crease 43.6 cm   on 10th visit 10/12: 44.8   30 cm Proximal to Floor at Lateral Plantar Foot 44.2 cm   on 10th visit 10/12: 43.7   20 cm Proximal to Floor at Lateral Plantar Foot 41 cm   on 10th visit 10/12: 37.3   10 cm Proximal to Floor at Lateral Malleoli 32.4 cm   on 10th visit 10/12: 28.8   Circumference of ankle/heel 35 cm.   on 10th visit 10/12: 35.2   5 cm Proximal to 1st MTP Joint 23.6 cm   on 10th visit 10/12: 22.3   Across MTP Joint 23 cm   on 10th visit 10/12: 22.4                       [facility] Adult PT Treatment/Exercise - 07/02/21 0001       Manual Therapy   Manual Therapy Manual Lymphatic Drainage (MLD);Compression Bandaging;Other (comment)    Manual therapy comments completed seperate from all other aspects    Manual Lymphatic Drainage (MLD) to include supraclavicular, deep and superfical abdominal, inguinal axillary anastomosis followed by B LE    Compression Bandaging Multilayer short stretch bandages with 1/2in foam to distal LE, 1/2" foam for thigh    Other Manual Therapy Measurement, practice donning new compression garment.  Weighs 161.6#                       PT Short Term Goals - 06/25/21 1713       PT SHORT TERM GOAL #1   Title Pt to be I in self manual techniques.    Time 3    Period Weeks    Status On-going      PT SHORT TERM GOAL #2   Title PT to have decreased 2-3 cm in thigh and leg, not foot to decrease risk of cellulitis.    Time 3    Period Weeks    Status Achieved               PT Long Term Goals - 06/25/21 1714       PT LONG TERM GOAL #1   Title PT to have obtained an Ankle foot wrap and verbalize that she is to wear the compression garment on a daily basis not just when  she flares up    Time 6    Period Weeks    Status  On-going   PT desires to go with a compression garment rather than juxtafit     PT LONG TERM GOAL #2   Title PT to have lost between 3-4 cm from thigh and leg measurements on average, not foot to decrease cellulitis and be able to fit into shoes and clothing easier.    Time 6    Period Weeks    Status Partially Met                   Plan - 07/02/21 1717     Clinical Impression Statement Pt brought in new thigh high compression hose with butler but 15-20 mmHg, pt unable to put on butler due to hand weakness and wrist pain.  Pt educated on need for 20-30 mmHg for proper compression.  Feel pt will benefit from [place] for increased ease donning I.  Encouraged to call [facility] and set up apt for measurements.  Measurements taken wiht overall reduction more proximal, pt continues to have swelling present Bil ankles.  [place]ul complete anterior only due to time restraints.  Less bandages around forefoot per request for comfort.    Personal Factors and Comorbidities Comorbidity 2;Comorbidity 3+;Fitness;Time since onset of injury/illness/exacerbation    Comorbidities gastric bypass followed by plastic surgery to remove excessive skin as pt lost over 250#, OA,    Examination-Activity Limitations Dressing;Locomotion Level;Stand    Examination-Participation Restrictions Cleaning;Community Activity;Shop;Meal Prep    Stability/Clinical Decision Making Evolving/Moderate complexity    Clinical Decision Making Moderate    Rehab Potential Good    PT Frequency 3x / week    PT Duration 6 weeks    PT Treatment/Interventions Manual techniques;Therapeutic activities;Patient/family education;Manual lymph drainage;Therapeutic exercise    PT Next Visit Plan complete manual decongestive techniques and measure on WEdnesdays.    PT Home Exercise Plan ankle pump, LAQ, hip ab/adduction, march, diaphragmic breathing and lymphatic squeeze.; self manual    Consulted and Agree with Plan of Care Patient              Patient will benefit from skilled therapeutic intervention in order to improve the following deficits and impairments:  Pain, Difficulty walking, Increased edema  Visit Diagnosis: Lymphedema, not elsewhere classified  Difficulty in walking, not elsewhere classified     Problem List Patient Active Problem List   Diagnosis Date Noted   Unilateral primary osteoarthritis, unspecified knee 05/06/2021   Acute exacerbation of chronic obstructive pulmonary disease (COPD) ([place]) 04/08/2021   History of anemia 04/08/2021   History of asthma 04/08/2021   History of COVID-19 04/08/2021   Shortness of breath 04/08/2021   Acute COVID-19 03/08/2021   Cough 03/08/2021   Fever 03/08/2021   Colon cancer screening 01/08/2021   Arthritis of right hand 11/15/2019   Age-related osteoporosis without current pathological fracture 10/30/2019   Left-sided weakness    Stroke-like symptoms 02/24/2018   HTN (hypertension) 02/24/2018   Primary osteoarthritis of both hands 11/03/2017   Dupuytren's contracture of left hand 11/03/2017   DDD (degenerative disc disease), lumbar 11/03/2017   History of gastric bypass 11/03/2017   Lymphedema 09/17/2017   History of vertebral fracture 09/17/2017   History of osteoporosis 09/17/2017   Iron deficiency anemia due to chronic blood loss 06/28/2017   Lower GI bleed 05/31/2017   Rectal mass 05/31/2017   Asthma 05/31/2017   GERD (gastroesophageal reflux disease) 05/31/2017   Closed fracture of upper  end of humerus 09/23/2016   Tendinitis 09/23/2016   Patulous eustachian tube of right ear 11/07/2015   Superior semicircular canal dehiscence, bilateral 11/07/2015   Vitamin D deficiency 08/20/2014   Secondary hyperparathyroidism ([place]) 08/20/2014   Ihor Austin, LPTA/CLT; CBIS [phone]  Aldona Lento, PTA 07/02/2021, 5:23 PM  [facility] [address] [place], [place], [address] Phone: [phone]    Fax:  [phone]  Name: [name] MRN: [id number] Date of Birth: Dec 16, 1950

## 2021-07-07 ENCOUNTER — Ambulatory Visit (HOSPITAL_COMMUNITY): Payer: Medicare Other | Admitting: Physical Therapy

## 2021-07-07 ENCOUNTER — Other Ambulatory Visit: Payer: Self-pay

## 2021-07-07 ENCOUNTER — Encounter (HOSPITAL_COMMUNITY): Payer: Self-pay | Admitting: Physical Therapy

## 2021-07-07 DIAGNOSIS — I890 Lymphedema, not elsewhere classified: Secondary | ICD-10-CM

## 2021-07-07 DIAGNOSIS — R262 Difficulty in walking, not elsewhere classified: Secondary | ICD-10-CM

## 2021-07-07 NOTE — Therapy (Signed)
[place], [place], [address] Phone: [phone]   Fax:  [phone]  Physical Therapy Treatment  Patient Details  Name: [name] MRN: [id number] Date of Birth: 1951-09-05 Referring Provider (PT): Stana Bunting   Encounter Date: 07/07/2021   PT End of Session - 07/07/21 1716     Visit Number 14    Number of Visits 18    Date for PT Re-Evaluation 07/09/21    Authorization Type UHC medicare    Authorization Time Period cert period 8/92-11/94    Progress Note Due on Visit 18    PT Start Time 1535    PT Stop Time 1645    PT Time Calculation (min) 70 min    Activity Tolerance Patient tolerated treatment well    Behavior During Therapy [facility] for tasks assessed/performed             Past Medical History:  Diagnosis Date   Asthma    GERD (gastroesophageal reflux disease)    Internal hemorrhoids    Iron deficiency anemia    Osteoporosis    Peptic ulcer    Tubular adenoma of colon    Vertigo     Past Surgical History:  Procedure Laterality Date   BACK SURGERY     BREAST SURGERY     CHOLECYSTECTOMY     COLONOSCOPY WITH PROPOFOL N/A 06/02/2017   Procedure: COLONOSCOPY WITH PROPOFOL;  Surgeon: Jonathon Bellows, MD;  Location: [facility] ENDOSCOPY;  Service: Gastroenterology;  Laterality: N/A;   COLONOSCOPY WITH PROPOFOL N/A 01/28/2021   Procedure: COLONOSCOPY WITH PROPOFOL;  Surgeon: Jonathon Bellows, MD;  Location: [facility] ENDOSCOPY;  Service: Gastroenterology;  Laterality: N/A;   ESOPHAGOGASTRODUODENOSCOPY (EGD) WITH PROPOFOL N/A 07/01/2017   Procedure: ESOPHAGOGASTRODUODENOSCOPY (EGD) WITH PROPOFOL;  Surgeon: Jonathon Bellows, MD;  Location: [facility] ENDOSCOPY;  Service: Gastroenterology;  Laterality: N/A;   ESOPHAGOGASTRODUODENOSCOPY (EGD) WITH PROPOFOL N/A 01/28/2021   Procedure: ESOPHAGOGASTRODUODENOSCOPY (EGD) WITH PROPOFOL;  Surgeon: Jonathon Bellows, MD;  Location: [facility] ENDOSCOPY;  Service: Gastroenterology;  Laterality:  N/A;   FLEXIBLE SIGMOIDOSCOPY N/A 07/01/2017   Procedure: FLEXIBLE SIGMOIDOSCOPY;  Surgeon: Jonathon Bellows, MD;  Location: [facility] ENDOSCOPY;  Service: Gastroenterology;  Laterality: N/A;   KNEE SURGERY     mini gastric bypass     SHOULDER SURGERY     TONSILLECTOMY      There were no vitals filed for this visit.   Subjective Assessment - 07/07/21 1713     Subjective Pt has no complaints, no pain    Pertinent History lymphedema    Limitations Walking;Standing    How long can you stand comfortably? 5 minuts    How long can you walk comfortably? 5-10 minutes at the most    Patient Stated Goals to get her swelling down    Currently in Pain? No/denies                               [facility] Adult PT Treatment/Exercise - 07/07/21 0001       Manual Therapy   Manual Therapy Manual Lymphatic Drainage (MLD);Compression Bandaging;Other (comment)    Manual therapy comments completed seperate from all other aspects    Manual Lymphatic Drainage (MLD) to include supraclavicular, deep and superfical abdominal, inguinal axillary anastomosis followed by B LEboth anterior and posteriorly    Compression Bandaging Multilayer short stretch bandages with 1/2in foam to distal LE, 1/2" foam for thigh  PT Short Term Goals - 06/25/21 1713       PT SHORT TERM GOAL #1   Title Pt to be I in self manual techniques.    Time 3    Period Weeks    Status On-going      PT SHORT TERM GOAL #2   Title PT to have decreased 2-3 cm in thigh and leg, not foot to decrease risk of cellulitis.    Time 3    Period Weeks    Status Achieved               PT Long Term Goals - 06/25/21 1714       PT LONG TERM GOAL #1   Title PT to have obtained an Ankle foot wrap and verbalize that she is to wear the compression garment on a daily basis not just when she flares up    Time 6    Period Weeks    Status On-going   PT desires to go with a compression garment rather  than juxtafit     PT LONG TERM GOAL #2   Title PT to have lost between 3-4 cm from thigh and leg measurements on average, not foot to decrease cellulitis and be able to fit into shoes and clothing easier.    Time 6    Period Weeks    Status Partially Met                   Plan - 07/07/21 1716     Clinical Impression Statement Pt may be close to discharge as there appears to be limited decongestion at this point.  Will measure next treatment and discuss with pt.    Personal Factors and Comorbidities Comorbidity 2;Comorbidity 3+;Fitness;Time since onset of injury/illness/exacerbation    Comorbidities gastric bypass followed by plastic surgery to remove excessive skin as pt lost over 250#, OA,    Examination-Activity Limitations Dressing;Locomotion Level;Stand    Examination-Participation Restrictions Cleaning;Community Activity;Shop;Meal Prep    Stability/Clinical Decision Making Evolving/Moderate complexity    Rehab Potential Good    PT Frequency 3x / week    PT Duration 6 weeks    PT Treatment/Interventions Manual techniques;Therapeutic activities;Patient/family education;Manual lymph drainage;Therapeutic exercise    PT Next Visit Plan assess if pt is continuing to decongest    PT Home Exercise Plan ankle pump, LAQ, hip ab/adduction, march, diaphragmic breathing and lymphatic squeeze.; self manual    Consulted and Agree with Plan of Care Patient             Patient will benefit from skilled therapeutic intervention in order to improve the following deficits and impairments:  Pain, Difficulty walking, Increased edema  Visit Diagnosis: Lymphedema, not elsewhere classified  Difficulty in walking, not elsewhere classified     Problem List Patient Active Problem List   Diagnosis Date Noted   Unilateral primary osteoarthritis, unspecified knee 05/06/2021   Acute exacerbation of chronic obstructive pulmonary disease (COPD) ([place]) 04/08/2021   History of anemia  04/08/2021   History of asthma 04/08/2021   History of COVID-19 04/08/2021   Shortness of breath 04/08/2021   Acute COVID-19 03/08/2021   Cough 03/08/2021   Fever 03/08/2021   Colon cancer screening 01/08/2021   Arthritis of right hand 11/15/2019   Age-related osteoporosis without current pathological fracture 10/30/2019   Left-sided weakness    Stroke-like symptoms 02/24/2018   HTN (hypertension) 02/24/2018   Primary osteoarthritis of both hands 11/03/2017   Dupuytren's contracture of left hand  11/03/2017   DDD (degenerative disc disease), lumbar 11/03/2017   History of gastric bypass 11/03/2017   Lymphedema 09/17/2017   History of vertebral fracture 09/17/2017   History of osteoporosis 09/17/2017   Iron deficiency anemia due to chronic blood loss 06/28/2017   Lower GI bleed 05/31/2017   Rectal mass 05/31/2017   Asthma 05/31/2017   GERD (gastroesophageal reflux disease) 05/31/2017   Closed fracture of upper end of humerus 09/23/2016   Tendinitis 09/23/2016   Patulous eustachian tube of right ear 11/07/2015   Superior semicircular canal dehiscence, bilateral 11/07/2015   Vitamin D deficiency 08/20/2014   Secondary hyperparathyroidism ([place]) 08/20/2014    [name], PT CLT [phone]  07/07/2021, 5:19 PM  [facility] [address] [place], [place], [address] Phone: [phone]   Fax:  [phone]  Name: [name] MRN: [id number] Date of Birth: 06-05-1951

## 2021-07-09 ENCOUNTER — Encounter (HOSPITAL_COMMUNITY): Payer: Self-pay | Admitting: Physical Therapy

## 2021-07-09 ENCOUNTER — Other Ambulatory Visit: Payer: Self-pay

## 2021-07-09 ENCOUNTER — Ambulatory Visit (HOSPITAL_COMMUNITY): Payer: Medicare Other | Admitting: Physical Therapy

## 2021-07-09 DIAGNOSIS — I890 Lymphedema, not elsewhere classified: Secondary | ICD-10-CM

## 2021-07-09 NOTE — Therapy (Signed)
[facility] [address] [place], [place], [address] Phone: [phone]   Fax:  [phone]  Physical Therapy Treatment  Patient Details  Name: [name] [place] MRN: [id number] Date of Birth: Sep 09, 1951 Referring Provider (PT): Stana Bunting  PHYSICAL THERAPY DISCHARGE SUMMARY  Visits from Start of Care: 15  Current functional level related to goals / functional outcomes: See below   Remaining deficits: See below   Education / Equipment: HEp to improve lymph circulation   Patient agrees to discharge. Patient goals were met. Patient is being discharged due to being pleased with the current functional level.  Encounter Date: 07/09/2021   PT End of Session - 07/09/21 1310     Visit Number 15    Number of Visits 15    Date for PT Re-Evaluation 07/09/21    Authorization Type [employer] medicare    Authorization Time Period cert period 1/50-56/97    PT Start Time 1050    PT Stop Time 1215    PT Time Calculation (min) 85 min    Activity Tolerance Patient tolerated treatment well    Behavior During Therapy WFL for tasks assessed/performed             Past Medical History:  Diagnosis Date   Asthma    GERD (gastroesophageal reflux disease)    Internal hemorrhoids    Iron deficiency anemia    Osteoporosis    Peptic ulcer    Tubular adenoma of colon    Vertigo     Past Surgical History:  Procedure Laterality Date   BACK SURGERY     BREAST SURGERY     CHOLECYSTECTOMY     COLONOSCOPY WITH PROPOFOL N/A 06/02/2017   Procedure: COLONOSCOPY WITH PROPOFOL;  Surgeon: Jonathon Bellows, MD;  Location: [facility] ENDOSCOPY;  Service: Gastroenterology;  Laterality: N/A;   COLONOSCOPY WITH PROPOFOL N/A 01/28/2021   Procedure: COLONOSCOPY WITH PROPOFOL;  Surgeon: Jonathon Bellows, MD;  Location: [facility] ENDOSCOPY;  Service: Gastroenterology;  Laterality: N/A;   ESOPHAGOGASTRODUODENOSCOPY (EGD) WITH PROPOFOL N/A 07/01/2017   Procedure:  ESOPHAGOGASTRODUODENOSCOPY (EGD) WITH PROPOFOL;  Surgeon: Jonathon Bellows, MD;  Location: [facility] ENDOSCOPY;  Service: Gastroenterology;  Laterality: N/A;   ESOPHAGOGASTRODUODENOSCOPY (EGD) WITH PROPOFOL N/A 01/28/2021   Procedure: ESOPHAGOGASTRODUODENOSCOPY (EGD) WITH PROPOFOL;  Surgeon: Jonathon Bellows, MD;  Location: [facility] ENDOSCOPY;  Service: Gastroenterology;  Laterality: N/A;   FLEXIBLE SIGMOIDOSCOPY N/A 07/01/2017   Procedure: FLEXIBLE SIGMOIDOSCOPY;  Surgeon: Jonathon Bellows, MD;  Location: [facility] ENDOSCOPY;  Service: Gastroenterology;  Laterality: N/A;   KNEE SURGERY     mini gastric bypass     SHOULDER SURGERY     TONSILLECTOMY      There were no vitals filed for this visit.    S:  Pt states she is in no pain.  She pumped prior to coming to her appointment.  She is interested in obtaining  Thigh and knee juxtafit.       LYMPHEDEMA/ONCOLOGY QUESTIONNAIRE - 07/09/21 0001       Right Lower Extremity Lymphedema   20 cm Proximal to Suprapatella 58 cm   was 60.5   10 cm Proximal to Suprapatella 52.3 cm   was 56   At Midpatella/Popliteal Crease 44.5 cm   was 44.5   30 cm Proximal to Floor at Lateral Plantar Foot 42.5 cm   was 47,8   20 cm Proximal to Floor at Lateral Plantar Foot 39.8 1   was 42.3   10 cm Proximal to Floor at Lateral Malleoli 30  cm   was 30.9   Circumference of ankle/heel 33.8 cm.   was 35   5 cm Proximal to 1st MTP Joint 22 cm   was 23   Across MTP Joint 22.5 cm   was 23.5     Left Lower Extremity Lymphedema   20 cm Proximal to Suprapatella 58 cm   was 60   10 cm Proximal to Suprapatella 53 cm   was 56.3   At Midpatella/Popliteal Crease 44 cm   was 49.9   30 cm Proximal to Floor at Lateral Plantar Foot 44.5 cm   was 46.5   20 cm Proximal to Floor at Lateral Plantar Foot 40 cm   was41.3   10 cm Proximal to Floor at Lateral Malleoli 30.5 cm   was 30.5   Circumference of ankle/heel 34 cm.   was 36   5 cm Proximal to 1st MTP Joint 22.8 cm   was 23.9   Across MTP Joint 23.2  cm   was 24                       [facility] Adult PT Treatment/Exercise - 07/09/21 0001       Manual Therapy   Manual Therapy Manual Lymphatic Drainage (MLD);Compression Bandaging    Manual therapy comments completed seperate from all other aspects    Manual Lymphatic Drainage (MLD) to include supraclavicular, deep and superfical abdominal, inguinal axillary anastomosis followed by B LEboth anterior and posteriorly    Compression Bandaging instructed how to use foot piece for juxtafit as well as donning LE juxtafit and measuring for thigh high with knee                       PT Short Term Goals - 07/09/21 1313       PT SHORT TERM GOAL #1   Title Pt to be I in self manual techniques.    Time 3    Period Weeks    Status On-going      PT SHORT TERM GOAL #2   Title PT to have decreased 2-3 cm in thigh and leg, not foot to decrease risk of cellulitis.    Time 3    Period Weeks    Status Achieved               PT Long Term Goals - 07/09/21 1313       PT LONG TERM GOAL #1   Title PT to have obtained an Ankle foot wrap and verbalize that she is to wear the compression garment on a daily basis not just when she flares up    Time 6    Period Weeks    Status Achieved   PT desires to go with a compression garment rather than juxtafit     PT LONG TERM GOAL #2   Title PT to have lost between 3-4 cm from thigh and leg measurements on average, not foot to decrease cellulitis and be able to fit into shoes and clothing easier.    Time 6    Period Weeks    Status Achieved                   Plan - 07/09/21 1310     Clinical Impression Statement Pt measured and is ready for discharge.  PT is going to order thigh/knee juxtafit B.  Pt had ankle piece but had not used due to the fact that she  did not know how to use them.  Therapist instructed pt on how to don her ankle juxtafit.    Personal Factors and Comorbidities Comorbidity 2;Comorbidity  3+;Fitness;Time since onset of injury/illness/exacerbation    Comorbidities gastric bypass followed by plastic surgery to remove excessive skin as pt lost over 250#, OA,    Examination-Activity Limitations Dressing;Locomotion Level;Stand    Examination-Participation Restrictions Cleaning;Community Activity;Shop;Meal Prep    Stability/Clinical Decision Making Evolving/Moderate complexity    Rehab Potential Good    PT Frequency 3x / week    PT Duration 6 weeks    PT Treatment/Interventions Manual techniques;Therapeutic activities;Patient/family education;Manual lymph drainage;Therapeutic exercise    PT Next Visit Plan Discharge to Home maintenance.    PT Home Exercise Plan ankle pump, LAQ, hip ab/adduction, march, diaphragmic breathing and lymphatic squeeze.; self manual    Consulted and Agree with Plan of Care Patient             Patient will benefit from skilled therapeutic intervention in order to improve the following deficits and impairments:  Pain, Difficulty walking, Increased edema  Visit Diagnosis: Lymphedema, not elsewhere classified     Problem List Patient Active Problem List   Diagnosis Date Noted   Unilateral primary osteoarthritis, unspecified knee 05/06/2021   Acute exacerbation of chronic obstructive pulmonary disease (COPD) ([place]) 04/08/2021   History of anemia 04/08/2021   History of asthma 04/08/2021   History of COVID-19 04/08/2021   Shortness of breath 04/08/2021   Acute COVID-19 03/08/2021   Cough 03/08/2021   Fever 03/08/2021   Colon cancer screening 01/08/2021   Arthritis of right hand 11/15/2019   Age-related osteoporosis without current pathological fracture 10/30/2019   Left-sided weakness    Stroke-like symptoms 02/24/2018   HTN (hypertension) 02/24/2018   Primary osteoarthritis of both hands 11/03/2017   Dupuytren's contracture of left hand 11/03/2017   DDD (degenerative disc disease), lumbar 11/03/2017   History of gastric bypass 11/03/2017    Lymphedema 09/17/2017   History of vertebral fracture 09/17/2017   History of osteoporosis 09/17/2017   Iron deficiency anemia due to chronic blood loss 06/28/2017   Lower GI bleed 05/31/2017   Rectal mass 05/31/2017   Asthma 05/31/2017   GERD (gastroesophageal reflux disease) 05/31/2017   Closed fracture of upper end of humerus 09/23/2016   Tendinitis 09/23/2016   Patulous eustachian tube of right ear 11/07/2015   Superior semicircular canal dehiscence, bilateral 11/07/2015   Vitamin D deficiency 08/20/2014   Secondary hyperparathyroidism ([place]) 08/20/2014   [name], PT CLT [phone]  07/09/2021, 1:15 PM  [facility] [address] [place], [place], [address] Phone: [phone]   Fax:  [phone]  Name: [name] [place] MRN: [id number] Date of Birth: 28-Aug-1951

## 2021-07-11 ENCOUNTER — Encounter (HOSPITAL_COMMUNITY): Payer: Medicare Other

## 2021-07-29 ENCOUNTER — Ambulatory Visit (INDEPENDENT_AMBULATORY_CARE_PROVIDER_SITE_OTHER): Payer: Medicare Other | Admitting: Family Medicine

## 2021-07-29 ENCOUNTER — Encounter: Payer: Self-pay | Admitting: Family Medicine

## 2021-07-29 ENCOUNTER — Ambulatory Visit (INDEPENDENT_AMBULATORY_CARE_PROVIDER_SITE_OTHER): Payer: Medicare Other

## 2021-07-29 ENCOUNTER — Telehealth: Payer: Self-pay | Admitting: Family Medicine

## 2021-07-29 ENCOUNTER — Encounter: Payer: Self-pay | Admitting: *Deleted

## 2021-07-29 ENCOUNTER — Encounter (HOSPITAL_COMMUNITY): Payer: Self-pay | Admitting: Hematology

## 2021-07-29 ENCOUNTER — Other Ambulatory Visit: Payer: Self-pay

## 2021-07-29 ENCOUNTER — Other Ambulatory Visit: Payer: Self-pay | Admitting: Family Medicine

## 2021-07-29 VITALS — BP 100/70 | HR 78 | Ht 64.0 in | Wt 164.4 lb

## 2021-07-29 DIAGNOSIS — R079 Chest pain, unspecified: Secondary | ICD-10-CM

## 2021-07-29 DIAGNOSIS — R002 Palpitations: Secondary | ICD-10-CM

## 2021-07-29 DIAGNOSIS — R600 Localized edema: Secondary | ICD-10-CM

## 2021-07-29 DIAGNOSIS — R42 Dizziness and giddiness: Secondary | ICD-10-CM

## 2021-07-29 DIAGNOSIS — R0602 Shortness of breath: Secondary | ICD-10-CM | POA: Diagnosis not present

## 2021-07-29 NOTE — Patient Instructions (Addendum)
Medication Instructions:  Your physician recommends that you continue on your current medications as directed. Please refer to the Current Medication list given to you today.`  Labwork: none  Testing/Procedures: Your physician has requested that you have a lexiscan myoview. For further information please visit [email or web address]. Please follow instruction sheet, as given. ZIO- Long Term Monitor Instructions   Your physician has requested you wear your ZIO patch monitor 14 days.   This is a single patch monitor.  [employer] supplies one patch monitor per enrollment.  Additional stickers are not available.   Please do not apply patch if you will be having a Nuclear Stress Test, Echocardiogram, Cardiac CT, MRI, or Chest Xray during the time frame you would be wearing the monitor. The patch cannot be worn during these tests.  You cannot remove and re-apply the ZIO XT patch monitor.   Your ZIO patch monitor will be sent USPS Priority mail from [facility] directly to your home address. The monitor may also be mailed to a PO BOX if home delivery is not available.   It may take 3-5 days to receive your monitor after you have been enrolled.   Once you have received you monitor, please review enclosed instructions.  Your monitor has already been registered assigning a specific monitor serial # to you.   Applying the monitor   Shave hair from upper left chest.   Hold abrader disc by orange tab.  Rub abrader in 40 strokes over left upper chest as indicated in your monitor instructions.   Clean area with 4 enclosed alcohol pads .  Use all pads to assure are is cleaned thoroughly.  Let dry.   Apply patch as indicated in monitor instructions.  Patch will be place under collarbone on left side of chest with arrow pointing upward.   Rub patch adhesive wings for 2 minutes.Remove white label marked "1".  Remove white label marked "2".  Rub patch adhesive wings for 2 additional minutes.   While  looking in a mirror, press and release button in center of patch.  A small green light will flash 3-4 times .  This will be your only indicator the monitor has been turned on.     Do not shower for the first 24 hours.  You may shower after the first 24 hours.   Press button if you feel a symptom. You will hear a small click.  Record Date, Time and Symptom in the Patient Log Book.   When you are ready to remove patch, follow instructions on last 2 pages of Patient Log Book.  Stick patch monitor onto last page of Patient Log Book.   Place Patient Log Book in [place] box.  Use locking tab on box and tape box closed securely.  The [employer] and [employer] has [employer] on it.  Please place in mailbox as soon as possible.  Your physician should have your test results approximately 7 days after the monitor has been mailed back to [facility]   Call [facility] at [phone] if you have questions regarding your ZIO XT patch monitor.  Call them immediately if you see an orange light blinking on your monitor.   If your monitor falls off in less than 4 days contact our Monitor department at [phone].  If your monitor becomes loose or falls off after 4 days call [employer] at [phone] for suggestions on securing your monitor.  Follow-Up: Your physician recommends that you schedule a follow-up appointment in: 2  months  Any Other Special Instructions Will Be Listed Below (If Applicable).  If you need a refill on your cardiac medications before your next appointment, please call your pharmacy.

## 2021-07-29 NOTE — Progress Notes (Signed)
Cardiology Office Note  Date: 07/29/2021   ID: [name], DOB May 20, 1951, MRN [id number]  PCP:  Bonnita Hollow, MD  Cardiologist:  None Electrophysiologist:  None   Chief Complaint: Palpitations, intermittent chest pain, shortness of breath, lower extremity edema  History of Present Illness: [name] is a 70 y.o. female with a history of HTN, COPD, asthma, GERD, secondary hyperparathyroidism, lymphedema, history of anemia, history of shortness of breath, history of COVID-19 history of gastric bypass.  Was last seen by Dr. Bronson Ing on 09/26/2019 via telemedicine for complaints of palpitations.  She had been hospitalized in June 2019 for strokelike symptoms with negative head CT and MRI/MRI.  She carotid Doppler showed mild chronic atherosclerosis with less than 50% bilateral stenosis.  Nuclear stress test 2019 showed defect with significant degree of soft tissue attenuation but mild degree of septal ischemia could not be entirely ruled out.  While left ventricular systolic function appeared grossly normal.  LVEF was calculated 25%.  Echocardiogram demonstrated normal LV function with EF of 55 to 60%.  Mild mitral and aortic regurgitation and mild left atrial dilatation.  Event monitor showed sinus rhythm with PACs and atrial runs.  She seldom had palpitations.  She denied chest pain or shortness of breath.  Has chronic lower extremity edema.  She had leg pumping support stockings.  She had previously been on metoprolol but had stopped taking it on her own.  She was seldom experiencing palpitations.  She is here today with complaints of palpitations with associated dizziness, feeling near syncopal at times.  Also with some chest discomfort which comes and goes.  She had a recent echocardiogram which showed decreased EF when compared to previous echocardiogram back in2019.  She had an echocardiogram on December 09, 2017 with EF of 55 to 60%.  Normal wall motion.  Normal  diastolic parameters, mild aortic regurgitation, mild mitral regurgitation, LA mildly dilated.  Recent echocardiogram on 07/25/2021 at [facility] for dyspnea on exertion and leg edema showed EF of 45 to 50% visually estimated, G1 DD, mild mitral regurgitation, mild aortic regurgitation, LA moderately dilated.  She states when she has these episodes she has to lay down.  She states her blood pressure drops when these episodes occur also her O2 saturations drop when palpitations occur.  Her EKG today shows sinus rhythm with premature atrial complexes with aberrant conduction.  Heart rate of 75.  Blood pressure on the low side at 100/70.  She is on no antihypertensive medications nor beta-blockers or calcium channel blockers.  She has a history of lymphedema and has been treated in the lymphedema clinic recently.  Past Medical History:  Diagnosis Date   Asthma    GERD (gastroesophageal reflux disease)    Internal hemorrhoids    Iron deficiency anemia    Osteoporosis    Peptic ulcer    Tubular adenoma of colon    Vertigo     Past Surgical History:  Procedure Laterality Date   BACK SURGERY     BREAST SURGERY     CHOLECYSTECTOMY     COLONOSCOPY WITH PROPOFOL N/A 06/02/2017   Procedure: COLONOSCOPY WITH PROPOFOL;  Surgeon: Jonathon Bellows, MD;  Location: [facility] ENDOSCOPY;  Service: Gastroenterology;  Laterality: N/A;   COLONOSCOPY WITH PROPOFOL N/A 01/28/2021   Procedure: COLONOSCOPY WITH PROPOFOL;  Surgeon: Jonathon Bellows, MD;  Location: [facility] ENDOSCOPY;  Service: Gastroenterology;  Laterality: N/A;   ESOPHAGOGASTRODUODENOSCOPY (EGD) WITH PROPOFOL N/A 07/01/2017   Procedure: ESOPHAGOGASTRODUODENOSCOPY (EGD) WITH PROPOFOL;  Surgeon: Jonathon Bellows, MD;  Location: [facility] ENDOSCOPY;  Service: Gastroenterology;  Laterality: N/A;   ESOPHAGOGASTRODUODENOSCOPY (EGD) WITH PROPOFOL N/A 01/28/2021   Procedure: ESOPHAGOGASTRODUODENOSCOPY (EGD) WITH PROPOFOL;  Surgeon: Jonathon Bellows, MD;  Location: [facility] ENDOSCOPY;  Service:  Gastroenterology;  Laterality: N/A;   FLEXIBLE SIGMOIDOSCOPY N/A 07/01/2017   Procedure: FLEXIBLE SIGMOIDOSCOPY;  Surgeon: Jonathon Bellows, MD;  Location: [facility] ENDOSCOPY;  Service: Gastroenterology;  Laterality: N/A;   KNEE SURGERY     mini gastric bypass     SHOULDER SURGERY     TONSILLECTOMY      Current Outpatient Medications  Medication Sig Dispense Refill   calcitRIOL (ROCALTROL) 0.5 MCG capsule Take 0.5 mcg by mouth daily.  5   Cholecalciferol (VITAMIN D3) 5000 UNITS CAPS Take 1 capsule by mouth daily.      denosumab (PROLIA) 60 MG/ML SOSY injection Inject 60 mg into the skin every 6 (six) months.     Melatonin 5 MG CAPS Take by mouth as needed.      Multiple Vitamin (MULTIVITAMIN) tablet Take 1 tablet by mouth 2 (two) times daily.      pantoprazole (PROTONIX) 20 MG tablet Take 1 tablet (20 mg total) by mouth daily. 90 tablet 3   VITAMIN A PO Take 1 capsule by mouth daily.      VITAMIN E PO Take 1 capsule by mouth daily.      No current facility-administered medications for this visit.   Allergies:  Lac bovis, Prednisone, Sulfa antibiotics, Tape, and Zithromax [azithromycin]   Social History: The patient  reports that she has never smoked. She has never used smokeless tobacco. She reports that she does not drink alcohol and does not use drugs.   Family History: The patient's family history includes Breast cancer in her paternal grandmother; Colon cancer in her father; Diabetes in her father and another family member; Healthy in her daughter; Heart attack in an other family member; Heart disease in her father; Hypertension in an other family member; Obesity in an other family member; Stroke in an other family member.   ROS:  Please see the history of present illness. Otherwise, complete review of systems is positive for none.  All other systems are reviewed and negative.   Physical Exam: VS:  BP 100/70   Pulse 78   Ht 5\' 4"  (1.626 m)   Wt 164 lb 6.4 oz (74.6 kg)   SpO2 98%   BMI  28.22 kg/m , BMI Body mass index is 28.22 kg/m.  Wt Readings from Last 3 Encounters:  07/29/21 164 lb 6.4 oz (74.6 kg)  06/05/21 166 lb 3.6 oz (75.4 kg)  05/06/21 170 lb 3.2 oz (77.2 kg)    General: Patient appears comfortable at rest. Neck: Supple, no elevated JVP or carotid bruits, no thyromegaly. Lungs: Clear to auscultation, nonlabored breathing at rest. Cardiac: Regular rate and rhythm, no S3 or significant systolic murmur, no pericardial rub. Extremities: Lymphedema, distal pulses 2+. Skin: Warm and dry. Musculoskeletal: No kyphosis. Neuropsychiatric: Alert and oriented x3, affect grossly appropriate.  ECG: 07/29/2021 EKG normal sinus rhythm with premature atrial complexes with aberrant conduction rate of 75.  Recent Labwork: 05/29/2021: ALT 30; AST 31; BUN 10; Creatinine, Ser 0.62; Hemoglobin 12.1; Platelets 191; Potassium 4.0; Sodium 141     Component Value Date/Time   CHOL 126 02/25/2018 0612   TRIG 35 02/25/2018 0612   HDL 58 02/25/2018 0612   CHOLHDL 2.2 02/25/2018 0612   VLDL 7 02/25/2018 0612   LDLCALC 61 02/25/2018 0612  Other Studies Reviewed Today:   Echocardiogram 07/25/2021 [facility] Indications     - Chest tightness Dyspnea on exertion Leg edema  Complete two-dimensional, color flow and Doppler transthoracic echocardiogram is performed.  Staff Referring Physician:     Alesia Banda ; Sonographer:     Cardell Peach Ordering Physician:     Alesia Banda  Account #:     [id number]  Reason for Poor Study:     poor echocardiographic windows   Summary  1. Technically difficult study.  2. The left ventricle is upper normal in size with normal wall thickness.  3. The left ventricular systolic function is mildly decreased, LVEF is visually estimated at 45-50%.  4. There is grade I diastolic dysfunction (impaired relaxation).  5. There is mild mitral valve regurgitation.  6. There is mild aortic regurgitation.  7. The  left atrium is moderately dilated in size.  8. The right ventricle is normal in size, with normal systolic function.    Echocardiogram 12/09/2017 - Left ventricle: The cavity size was normal. Wall thickness was    normal. Systolic function was normal. The estimated ejection    fraction was in the range of 55% to 60%. Wall motion was normal;    there were no regional wall motion abnormalities. Left    ventricular diastolic function parameters were normal.  - Aortic valve: There was mild regurgitation. Valve area (VTI):    2.38 cm^2. Valve area (Vmax): 2.18 cm^2. Valve area (Vmean): 2.02    cm^2.  - Mitral valve: There was mild regurgitation.  - Left atrium: The atrium was mildly dilated.  - Technically adequate study.      Assessment and Plan:  1. Palpitations   2. Shortness of breath   3. Lower extremity edema   4. Chest pain, unspecified type    1. Palpitations Patient complaining of palpitations/rapid heartbeats.  She states when these occur she has shortness of breath, dizziness, decreased oxygen saturation on her pulse ox probe.  She states when the heart rates increase she becomes dizzy.  Please get a 14-day ZIO monitor to assess for arrhythmias.  2. Shortness of breath Complains of shortness of breath when palpitations occur.  She recently had an echocardiogram for DOE and lower extremity edema.  Recent echocardiogram on 07/25/2021 at [facility] for dyspnea on exertion and leg edema showed EF of 45 to 50% visually estimated, G1 DD, mild mitral regurgitation, mild aortic regurgitation, LA moderately dilated.  3. Lower extremity edema She has chronic lymphedema and has been following in the lymphedema clinic with improvement of her lower extremity edema/lymphedema.  5. Chest pain, unspecified type States she has been having some chest discomfort/chest pain in her upper chest recently which sometimes can be associated with the palpitations and some shortness of breath.   Please get a Lexiscan Myoview stress test.  Medication Adjustments/Labs and Tests Ordered: Current medicines are reviewed at length with the patient today.  Concerns regarding medicines are outlined above.   Disposition: Follow-up with Carlyle Dolly or APP 2 months  Signed, Levell July, NP 07/29/2021 3:03 PM    [facility] [place], [place], [place] [address] Phone: [phone]; Fax: [phone]

## 2021-07-29 NOTE — Telephone Encounter (Signed)
PERCERT  14 Day ZIO XT/[place]

## 2021-08-06 ENCOUNTER — Encounter (HOSPITAL_COMMUNITY): Payer: Self-pay

## 2021-08-06 ENCOUNTER — Ambulatory Visit (HOSPITAL_COMMUNITY)
Admission: RE | Admit: 2021-08-06 | Discharge: 2021-08-06 | Disposition: A | Discharge status: Home / Self Care | Payer: Medicare Other | Source: Ambulatory Visit | Attending: Family Medicine | Admitting: Family Medicine

## 2021-08-06 ENCOUNTER — Other Ambulatory Visit: Payer: Self-pay

## 2021-08-06 ENCOUNTER — Telehealth: Payer: Self-pay | Admitting: *Deleted

## 2021-08-06 ENCOUNTER — Encounter (HOSPITAL_COMMUNITY)
Admission: RE | Admit: 2021-08-06 | Discharge: 2021-08-06 | Disposition: A | Discharge status: Home / Self Care | Payer: Medicare Other | Source: Ambulatory Visit | Attending: Family Medicine | Admitting: Family Medicine

## 2021-08-06 DIAGNOSIS — R079 Chest pain, unspecified: Secondary | ICD-10-CM | POA: Insufficient documentation

## 2021-08-06 HISTORY — DX: Heart failure, unspecified: I50.9

## 2021-08-06 LAB — NM MYOCAR MULTI W/SPECT W/WALL MOTION / EF
LV dias vol: 62 mL (ref 46–106)
LV sys vol: 21 mL
Nuc Stress EF: 66 %
Peak HR: 106 {beats}/min
RATE: 0.4
Rest HR: 76 {beats}/min
Rest Nuclear Isotope Dose: 8.5 mCi
SDS: 1
SRS: 0
SSS: 1
ST Depression (mm): 0 mm
Stress Nuclear Isotope Dose: 25 mCi
TID: 0.93

## 2021-08-06 MED ORDER — SODIUM CHLORIDE FLUSH 0.9 % IV SOLN
INTRAVENOUS | Status: AC
  Administered 2021-08-06: 10 mL via INTRAVENOUS
  Filled 2021-08-06: qty 10

## 2021-08-06 MED ORDER — REGADENOSON 0.4 MG/5ML IV SOLN
INTRAVENOUS | Status: AC
  Administered 2021-08-06: 0.4 mg via INTRAVENOUS
  Filled 2021-08-06: qty 5

## 2021-08-06 MED ORDER — TECHNETIUM TC 99M TETROFOSMIN IV KIT
7.0000 | PACK | Freq: Once | INTRAVENOUS | Status: AC | PRN
  Administered 2021-08-06: 8.5 via INTRAVENOUS

## 2021-08-06 MED ORDER — TECHNETIUM TC 99M TETROFOSMIN IV KIT
21.0000 | PACK | Freq: Once | INTRAVENOUS | Status: AC | PRN
  Administered 2021-08-06: 25 via INTRAVENOUS

## 2021-08-06 NOTE — Telephone Encounter (Signed)
Patient informed. Copy sent to PCP °

## 2021-08-06 NOTE — Telephone Encounter (Signed)
-----   Message from Laurine Blazer, LPN sent at 33/17/4099  4:09 PM EST -----  ----- Message ----- From: Verta Ellen., NP Sent: 08/06/2021   4:01 PM EST To: Laurine Blazer, LPN  Please call the patient and let her know the stress test was considered low risk per the physician who interpreted the study  Verta Ellen, NP  08/06/2021 4:01 PM

## 2021-08-13 DIAGNOSIS — R42 Dizziness and giddiness: Secondary | ICD-10-CM

## 2021-08-13 DIAGNOSIS — R002 Palpitations: Secondary | ICD-10-CM | POA: Diagnosis not present

## 2021-08-15 ENCOUNTER — Ambulatory Visit (INDEPENDENT_AMBULATORY_CARE_PROVIDER_SITE_OTHER): Payer: Medicare Other | Admitting: Pulmonary Disease

## 2021-08-15 ENCOUNTER — Encounter: Payer: Self-pay | Admitting: Pulmonary Disease

## 2021-08-15 ENCOUNTER — Other Ambulatory Visit: Payer: Self-pay

## 2021-08-15 VITALS — BP 108/70 | HR 83 | Temp 97.9°F | Ht 63.0 in | Wt 173.8 lb

## 2021-08-15 DIAGNOSIS — R0602 Shortness of breath: Secondary | ICD-10-CM

## 2021-08-15 DIAGNOSIS — R002 Palpitations: Secondary | ICD-10-CM

## 2021-08-15 MED ORDER — FUROSEMIDE 20 MG PO TABS: 20.0000 mg | ORAL_TABLET | Freq: Two times a day (BID) | ORAL | 0 refills | Status: DC

## 2021-08-15 NOTE — Assessment & Plan Note (Signed)
Unclear what is causing her episodic palpitations and shortness of breath.  I am curious to see what the Zio patch shows and whether it picks up on atrial tachyarrhythmias. She does not desaturate on exertion which is reassuring.  This does not appear to be a primary respiratory problem because it seems to resolve spontaneously within 10 minutes, no associated cough or wheezing.  I did not want to empirically give her bronchodilators for fear of worsening tachyarrhythmias. She is low risk for VTE, especially since symptoms have been ongoing for 4 months but this would need to be ruled out.  Unfortunately CT angiogram could not be done at [facility] due to IV access issues, we will try to schedule VQ scan.  If not just obtain a venous duplex  The other possibility is diastolic heart failure, she does report a 6 pound weight gain and her legs appear to be more swollen than prior, we will empirically try low-dose Lasix at 20 mg for 5 days and aim for a 5 pound weight loss and see if this provides her any symptomatic relief Follow-up will be based on results of above tests

## 2021-08-15 NOTE — Patient Instructions (Signed)
  X prescription for Lasix 20 mg daily for 5 days # 10 , aim for a 5 pound weight loss and see if the episodes improve  X schedule VQ scan to look for blood clots

## 2021-08-15 NOTE — Progress Notes (Signed)
Subjective:    Patient ID: [name], female    DOB: 01/09/1951, 70 y.o.   MRN: [id number]  HPI  70 year old never smoker presents for evaluation of episodic shortness of breath and palpitations. She reports that for the past 4 months she has been having episodes where she develops palpitations followed by shortness of breath and feels like she is about to pass out.  Episodes subside spontaneously after resting for 10 minutes.  They are triggered by any kinds of activities such as being in the shower, cleaning her house or while shopping.  She had 1 bad episode at the beach a few weeks ago where she checked her oxygen saturation was down to 80% and heart rate was up to 130. Evaluation was started by PCP   Echocardiogram on 07/25/2021 at [facility] for dyspnea on exertion and leg edema showed EF of 45 to 50% visually estimated, G1 DD, mild mitral regurgitation, mild aortic regurgitation, LA moderately dilated.    She was seen by cardiology, Lexiscan myocardial perfusion was low risk.  Zio patch is being monitored. CT angiogram was attempted but could not be obtained due to difficult IV access  She reports another episode of shortness of breath after using her leg pumps for lymphedema and had to sleep sitting up she does report that her legs are bigger than usual but they are chronically swollen  She reports lifelong history of recurrent bronchitis/asthma this improved significantly after her weight loss after gastric bypass.  She has albuterol MDI and nebs which she has not had to use in a long time  PMH - covid infection 12/2019, 02/2021 gastric bypass 2003 , malabsorption, weight dropped from 393 to her current 173 pounds secondary hyperparathyroidism,  chronic BLE ymphedema,   On ambulation, heart rate increased from 85-1 28 and oxygen saturation stayed at 98%, she stopped after 2 laps  Past Medical History:  Diagnosis Date   Asthma    CHF (congestive heart failure) (HCC)     GERD (gastroesophageal reflux disease)    Internal hemorrhoids    Iron deficiency anemia    Osteoporosis    Peptic ulcer    Tubular adenoma of colon    Vertigo    Past Surgical History:  Procedure Laterality Date   BACK SURGERY     BREAST SURGERY     CHOLECYSTECTOMY     COLONOSCOPY WITH PROPOFOL N/A 06/02/2017   Procedure: COLONOSCOPY WITH PROPOFOL;  Surgeon: Jonathon Bellows, MD;  Location: [facility] ENDOSCOPY;  Service: Gastroenterology;  Laterality: N/A;   COLONOSCOPY WITH PROPOFOL N/A 01/28/2021   Procedure: COLONOSCOPY WITH PROPOFOL;  Surgeon: Jonathon Bellows, MD;  Location: [facility] ENDOSCOPY;  Service: Gastroenterology;  Laterality: N/A;   ESOPHAGOGASTRODUODENOSCOPY (EGD) WITH PROPOFOL N/A 07/01/2017   Procedure: ESOPHAGOGASTRODUODENOSCOPY (EGD) WITH PROPOFOL;  Surgeon: Jonathon Bellows, MD;  Location: [facility] ENDOSCOPY;  Service: Gastroenterology;  Laterality: N/A;   ESOPHAGOGASTRODUODENOSCOPY (EGD) WITH PROPOFOL N/A 01/28/2021   Procedure: ESOPHAGOGASTRODUODENOSCOPY (EGD) WITH PROPOFOL;  Surgeon: Jonathon Bellows, MD;  Location: [facility] ENDOSCOPY;  Service: Gastroenterology;  Laterality: N/A;   FLEXIBLE SIGMOIDOSCOPY N/A 07/01/2017   Procedure: FLEXIBLE SIGMOIDOSCOPY;  Surgeon: Jonathon Bellows, MD;  Location: [facility] ENDOSCOPY;  Service: Gastroenterology;  Laterality: N/A;   KNEE SURGERY     mini gastric bypass     SHOULDER SURGERY     TONSILLECTOMY      Allergies  Allergen Reactions   Lac Bovis Diarrhea   Prednisone Diarrhea   Sulfa Antibiotics Itching   Tape Dermatitis  Zithromax [Azithromycin] Diarrhea    Social History   Socioeconomic History   Marital status: Married    Spouse name: Not on file   Number of children: Not on file   Years of education: Not on file   Highest education level: Not on file  Occupational History   Not on file  Tobacco Use   Smoking status: Never   Smokeless tobacco: Never  Vaping Use   Vaping Use: Never used  Substance and Sexual Activity   Alcohol use: No     Alcohol/week: 0.0 standard drinks   Drug use: Never   Sexual activity: Never  Other Topics Concern   Not on file  Social History Narrative   Not on file   Social Determinants of Health   Financial Resource Strain: Not on file  Food Insecurity: Not on file  Transportation Needs: Not on file  Physical Activity: Not on file  Stress: Not on file  Social Connections: Not on file  Intimate Partner Violence: Not on file     Family History  Problem Relation Age of Onset   Hypertension Other    Stroke Other    Diabetes Other    Heart attack Other    Obesity Other    Diabetes Father    Heart disease Father    Colon cancer Father    Breast cancer Paternal Grandmother    Healthy Daughter    Stomach cancer Neg Hx    Pancreatic cancer Neg Hx       Review of Systems Shortness of breath with activity Loss of appetite Itching Anxiety Chronic feet swelling   Constitutional: negative for anorexia, fevers and sweats  Eyes: negative for irritation, redness and visual disturbance  Ears, nose, mouth, throat, and face: negative for earaches, epistaxis, nasal congestion and sore throat  Respiratory: negative for cough, sputum and wheezing  Cardiovascular: negative for chest pain, orthopnea, palpitations and syncope  Gastrointestinal: negative for abdominal pain, constipation, diarrhea, melena, nausea and vomiting  Genitourinary:negative for dysuria, frequency and hematuria  Hematologic/lymphatic: negative for bleeding, easy bruising and lymphadenopathy  Musculoskeletal:negative for arthralgias, muscle weakness and stiff joints  Neurological: negative for coordination problems, gait problems, headaches and weakness  Endocrine: negative for diabetic symptoms including polydipsia, polyuria and weight loss      Objective:   Physical Exam  Gen. Pleasant, obese, in no distress, normal affect ENT - no pallor,icterus, no post nasal drip, class 2-3 airway Neck: No JVD, no thyromegaly,  no carotid bruits Lungs: no use of accessory muscles, no dullness to percussion, decreased without rales or rhonchi  Cardiovascular: Rhythm regular, heart sounds  normal, no murmurs or gallops, 2+  peripheral edema Abdomen: soft and non-tender, no hepatosplenomegaly, BS normal. Musculoskeletal: No deformities, no cyanosis or clubbing Neuro:  alert, non focal, no tremors       Assessment & Plan:

## 2021-08-18 NOTE — Addendum Note (Signed)
Addended by: Darliss Ridgel on: 08/18/2021 05:18 PM   Modules accepted: Orders

## 2021-08-19 ENCOUNTER — Other Ambulatory Visit: Payer: Self-pay | Admitting: *Deleted

## 2021-08-19 DIAGNOSIS — R0602 Shortness of breath: Secondary | ICD-10-CM

## 2021-08-20 ENCOUNTER — Telehealth: Payer: Self-pay | Admitting: Pulmonary Disease

## 2021-08-20 NOTE — Telephone Encounter (Signed)
This has been taken care of.

## 2021-08-21 ENCOUNTER — Other Ambulatory Visit: Payer: Self-pay

## 2021-08-21 ENCOUNTER — Encounter (HOSPITAL_COMMUNITY)
Admission: RE | Admit: 2021-08-21 | Discharge: 2021-08-21 | Disposition: A | Discharge status: Home / Self Care | Payer: Medicare Other | Source: Ambulatory Visit | Attending: Pulmonary Disease | Admitting: Pulmonary Disease

## 2021-08-21 ENCOUNTER — Ambulatory Visit (HOSPITAL_COMMUNITY)
Admission: RE | Admit: 2021-08-21 | Discharge: 2021-08-21 | Disposition: A | Discharge status: Home / Self Care | Payer: Medicare Other | Source: Ambulatory Visit | Attending: Pulmonary Disease | Admitting: Pulmonary Disease

## 2021-08-21 ENCOUNTER — Encounter (HOSPITAL_COMMUNITY): Payer: Self-pay

## 2021-08-21 DIAGNOSIS — R0602 Shortness of breath: Secondary | ICD-10-CM | POA: Diagnosis not present

## 2021-08-21 MED ORDER — TECHNETIUM TO 99M ALBUMIN AGGREGATED
4.0000 | Freq: Once | INTRAVENOUS | Status: AC | PRN
  Administered 2021-08-21: 4.2 via INTRAVENOUS

## 2021-08-22 ENCOUNTER — Telehealth: Payer: Self-pay | Admitting: Pulmonary Disease

## 2021-08-22 NOTE — Telephone Encounter (Signed)
Called and spoke with pt who stated she started having worsening SOB overnight yesterday 12/8 and said that she felt like she was even gasping for air. Pt said that she had a visit with PCP today 12/9 and was told by PCP to go to the ED to be evaluated.  Pt also said that she had not heard about the results of recent perfusion scan or cxr and I made her aware of those results. Stated to pt that it would be best for her to go to the ED to be evaluated due to her being very SOB and she verbalized understanding. Nothing further needed.

## 2021-08-25 ENCOUNTER — Encounter: Payer: Self-pay | Admitting: Pulmonary Disease

## 2021-08-25 NOTE — Telephone Encounter (Signed)
Dr Elsworth Soho, please advise on pt email, thanks!  [name] to [facility] (supporting Rigoberto Noel, MD)     10:50 AM I have seen the results of the tests you ordered and see that it shows right pleural effusion and base atelectasis.  I continue to have extreme episodes of shortness of breath and am concerned that these two things may be contributing to my problem breathing.  Do I need to come back in to talk about this test result and how it can be helped or corrected?  I must address this as it is beginning to cause me much anxiety as my shortness of breath continues to worsen.

## 2021-08-26 ENCOUNTER — Other Ambulatory Visit: Payer: Self-pay

## 2021-08-26 ENCOUNTER — Telehealth: Payer: Self-pay

## 2021-08-26 DIAGNOSIS — R0602 Shortness of breath: Secondary | ICD-10-CM

## 2021-08-26 NOTE — Telephone Encounter (Signed)
Order placed for PFTs. Asked for next available in order.

## 2021-08-26 NOTE — Telephone Encounter (Signed)
Called and gave patient results for pulm. Perfusion and CXR. Patient is asking why she is short of breath and she saw a result on mychart stating that she saw a result stating she had a lung collapse. She is not sure when or what test she saw this with. She is concerned about her quality of life and feeling like she cannot breathe evryday. She Would like to speak to Dr. Elsworth Soho about problems.   Dr. Elsworth Soho please advise if you can speak to this patient.   She states best number to call her at is [phone]

## 2021-09-01 ENCOUNTER — Ambulatory Visit (HOSPITAL_COMMUNITY): Payer: Medicare Other | Admitting: Physical Therapy

## 2021-09-17 ENCOUNTER — Ambulatory Visit (HOSPITAL_COMMUNITY): Payer: Medicare Other | Admitting: Physical Therapy

## 2021-09-17 ENCOUNTER — Other Ambulatory Visit (HOSPITAL_COMMUNITY): Payer: Self-pay | Admitting: Family Medicine

## 2021-09-17 DIAGNOSIS — Z1231 Encounter for screening mammogram for malignant neoplasm of breast: Secondary | ICD-10-CM

## 2021-09-19 ENCOUNTER — Encounter (HOSPITAL_COMMUNITY): Payer: Medicare Other | Admitting: Physical Therapy

## 2021-09-24 ENCOUNTER — Ambulatory Visit (HOSPITAL_COMMUNITY): Payer: Medicare Other | Attending: Internal Medicine | Admitting: Physical Therapy

## 2021-09-24 ENCOUNTER — Other Ambulatory Visit: Payer: Self-pay

## 2021-09-24 DIAGNOSIS — I890 Lymphedema, not elsewhere classified: Secondary | ICD-10-CM | POA: Insufficient documentation

## 2021-09-24 DIAGNOSIS — R262 Difficulty in walking, not elsewhere classified: Secondary | ICD-10-CM | POA: Diagnosis present

## 2021-09-24 NOTE — Therapy (Signed)
[place], [place], [address] Phone: [phone]   Fax:  [phone]  Physical Therapy Evaluation  Patient Details  Name: [name] MRN: [id number] Date of Birth: Aug 25, 1951 Referring Provider (PT): Stana Bunting   Encounter Date: 09/24/2021   PT End of Session - 09/24/21 1556     Visit Number 1    Number of Visits 15    Date for PT Re-Evaluation 10/29/21    Authorization Type [employer] medicare    Progress Note Due on Visit 10    PT Start Time 5277    PT Stop Time 1545    PT Time Calculation (min) 60 min    Activity Tolerance Patient tolerated treatment well    Behavior During Therapy [facility] for tasks assessed/performed             Past Medical History:  Diagnosis Date   Asthma    CHF (congestive heart failure) (HCC)    GERD (gastroesophageal reflux disease)    Internal hemorrhoids    Iron deficiency anemia    Osteoporosis    Peptic ulcer    Tubular adenoma of colon    Vertigo     Past Surgical History:  Procedure Laterality Date   BACK SURGERY     BREAST SURGERY     CHOLECYSTECTOMY     COLONOSCOPY WITH PROPOFOL N/A 06/02/2017   Procedure: COLONOSCOPY WITH PROPOFOL;  Surgeon: Jonathon Bellows, MD;  Location: [facility] ENDOSCOPY;  Service: Gastroenterology;  Laterality: N/A;   COLONOSCOPY WITH PROPOFOL N/A 01/28/2021   Procedure: COLONOSCOPY WITH PROPOFOL;  Surgeon: Jonathon Bellows, MD;  Location: [facility] ENDOSCOPY;  Service: Gastroenterology;  Laterality: N/A;   ESOPHAGOGASTRODUODENOSCOPY (EGD) WITH PROPOFOL N/A 07/01/2017   Procedure: ESOPHAGOGASTRODUODENOSCOPY (EGD) WITH PROPOFOL;  Surgeon: Jonathon Bellows, MD;  Location: [facility] ENDOSCOPY;  Service: Gastroenterology;  Laterality: N/A;   ESOPHAGOGASTRODUODENOSCOPY (EGD) WITH PROPOFOL N/A 01/28/2021   Procedure: ESOPHAGOGASTRODUODENOSCOPY (EGD) WITH PROPOFOL;  Surgeon: Jonathon Bellows, MD;  Location: [facility] ENDOSCOPY;  Service: Gastroenterology;  Laterality: N/A;    FLEXIBLE SIGMOIDOSCOPY N/A 07/01/2017   Procedure: FLEXIBLE SIGMOIDOSCOPY;  Surgeon: Jonathon Bellows, MD;  Location: [facility] ENDOSCOPY;  Service: Gastroenterology;  Laterality: N/A;   KNEE SURGERY     mini gastric bypass     SHOULDER SURGERY     TONSILLECTOMY      There were no vitals filed for this visit.    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  Her legs just keep getting bigger and they are painful to the touch indicative of lipedema.    Pertinent History lymphedema, tumor of the brain , CHF,    Limitations Walking;Standing    How long can you stand comfortably? 5 minuts    How long can you walk comfortably? 5-10 minutes at the most    Patient Stated Goals to get her swelling down    Currently in Pain? Yes    Pain Score 10-Worst pain ever    Pain Location Leg    Pain Orientation Left;Right   certain sore places to the touch   Pain Descriptors / Indicators Sore    Pain Onset More than a month ago    Pain Frequency Constant    Pain Relieving Factors not sure  [facility] PT Assessment - 09/24/21 0001       Assessment   Medical Diagnosis lymphedema    Referring Provider (PT) Stana Bunting    Prior Therapy in 2020      Precautions   Precautions --   cellulitis     Restrictions   Weight Bearing Restrictions No      Balance Screen   Has the patient fallen in the past 6 months No    Has the patient had a decrease in activity level because of a fear of falling?  Yes    Is the patient reluctant to leave their home because of a fear of falling?  No      Prior Function   Level of Independence Independent      Observation/Other Assessments   Skin Integrity dry  increased induration               LYMPHEDEMA/ONCOLOGY QUESTIONNAIRE - 09/24/21 0001       Right Lower Extremity Lymphedema    20 cm Proximal to Suprapatella 61 cm   was 58   10 cm Proximal to Suprapatella 54 cm   was 52.4   At Midpatella/Popliteal Crease 46.8 cm   was44.5   30 cm Proximal to Floor at Lateral Plantar Foot 46 cm   42.5   20 cm Proximal to Floor at Lateral Plantar Foot 43.2 1   39.8   10 cm Proximal to Floor at Lateral Malleoli 30.5 cm   30   Circumference of ankle/heel 34.5 cm.   33.8   5 cm Proximal to 1st MTP Joint 22.5 cm   22   Across MTP Joint 23.5 cm   22.5     Left Lower Extremity Lymphedema   20 cm Proximal to Suprapatella 59 cm   was 58   10 cm Proximal to Suprapatella 56.2 cm   53   At Midpatella/Popliteal Crease 46.4 cm   44   30 cm Proximal to Floor at Lateral Plantar Foot 46.8 cm   44.5   20 cm Proximal to Floor at Lateral Plantar Foot 43.4 cm   43.5   10 cm Proximal to Floor at Lateral Malleoli 32.8 cm   30.5   Circumference of ankle/heel 36.2 cm.   34   5 cm Proximal to 1st MTP Joint 22.3 cm   22.8   Across MTP Joint 22.8 cm   23.2                    Objective measurements completed on examination: See above findings.       [facility] Adult PT Treatment/Exercise - 09/24/21 0001       Manual Therapy   Manual Therapy Compression Bandaging    Manual therapy comments completed seperate from all other aspects    Compression Bandaging multilayer short stretch bandages with 1/2" foam from foot to thighB                     PT Education - 09/24/21 1555     Education Details Continued need to complete exercises; pump when bandages are off.    Person(s) Educated Patient    Methods Explanation    Comprehension Verbalized understanding              PT Short Term Goals - 09/24/21 1600       PT SHORT TERM GOAL #1   Title Pt to be I in self manual techniques.    Time  2    Period Weeks    Status New    Target Date 10/08/21      PT SHORT TERM GOAL #2   Title PT to have decreased 1.5cm in thigh and leg,    Time 2    Period Weeks    Status New                PT Long Term Goals - 09/24/21 1601       PT LONG TERM GOAL #1   Title PT to have ankle foot wrap, LE and thigh juxtafit and be able to Timmothy Sours I    Time 5    Period Weeks    Status New    Target Date 10/29/21      PT LONG TERM GOAL #2   Title PT to have lost between 3 cm from thigh and leg measurements on average,    Time 5    Period Weeks    Status New      PT LONG TERM GOAL #3   Title PT pain to be no greater than a 6/10 in LE    Time 5    Period Weeks    Status New                    Plan - 09/24/21 1558     Clinical Impression Statement Pt is a well known lymphedema pt to this clinic.  The pt states that she started to get increased lymphedema in her thighs, and trunk area.  States that the swelling is causing her to be sore to the touch.  PT has been completing exercises, using her juxtafit and pumping  daily but continues to have increased edema therefore she has been referred to skilled PT for complete decongestive techniques.    Personal Factors and Comorbidities Comorbidity 2;Comorbidity 3+;Fitness;Time since onset of injury/illness/exacerbation    Comorbidities gastric bypass followed by plastic surgery to remove excessive skin as pt lost over 250#, OA,    Examination-Activity Limitations Dressing;Locomotion Level;Stand    Examination-Participation Restrictions Cleaning;Community Activity;Shop;Meal Prep    Stability/Clinical Decision Making Evolving/Moderate complexity    Rehab Potential Good    PT Frequency 3x / week    PT Duration 6 weeks    PT Treatment/Interventions Manual techniques;Therapeutic activities;Patient/family education;Manual lymph drainage;Therapeutic exercise    PT Next Visit Plan CDT measure on WEd.    PT Home Exercise Plan ankle pump, LAQ, hip ab/adduction, march, diaphragmic breathing and lymphatic squeeze.; self manual    Consulted and Agree with Plan of Care Patient             Patient will benefit from skilled  therapeutic intervention in order to improve the following deficits and impairments:  Pain, Difficulty walking, Increased edema  Visit Diagnosis: Lymphedema, not elsewhere classified  Difficulty in walking, not elsewhere classified     Problem List Patient Active Problem List   Diagnosis Date Noted   Unilateral primary osteoarthritis, unspecified knee 05/06/2021   Acute exacerbation of chronic obstructive pulmonary disease (COPD) ([place]) 04/08/2021   History of anemia 04/08/2021   History of asthma 04/08/2021   History of COVID-19 04/08/2021   Shortness of breath 04/08/2021   Acute COVID-19 03/08/2021   Cough 03/08/2021   Fever 03/08/2021   Colon cancer screening 01/08/2021   Arthritis of right hand 11/15/2019   Age-related osteoporosis without current pathological fracture 10/30/2019   Left-sided weakness    Stroke-like symptoms 02/24/2018   HTN (hypertension) 02/24/2018  Primary osteoarthritis of both hands 11/03/2017   Dupuytren's contracture of left hand 11/03/2017   DDD (degenerative disc disease), lumbar 11/03/2017   History of gastric bypass 11/03/2017   Lymphedema 09/17/2017   History of vertebral fracture 09/17/2017   History of osteoporosis 09/17/2017   Iron deficiency anemia due to chronic blood loss 06/28/2017   Lower GI bleed 05/31/2017   Rectal mass 05/31/2017   Asthma 05/31/2017   GERD (gastroesophageal reflux disease) 05/31/2017   Closed fracture of upper end of humerus 09/23/2016   Tendinitis 09/23/2016   Patulous eustachian tube of right ear 11/07/2015   Superior semicircular canal dehiscence, bilateral 11/07/2015   Vitamin D deficiency 08/20/2014   Secondary hyperparathyroidism ([place]) 08/20/2014   [name], PT CLT [phone] 09/24/2021, 4:07 PM  [place] [address] [place], [place], [address] Phone: [phone]   Fax:  [phone]  Name: [name] MRN: [id number] Date of Birth:  [birth year]

## 2021-09-25 ENCOUNTER — Other Ambulatory Visit (HOSPITAL_COMMUNITY): Payer: Self-pay | Admitting: Family Medicine

## 2021-09-25 ENCOUNTER — Other Ambulatory Visit: Payer: Self-pay | Admitting: Family Medicine

## 2021-09-25 DIAGNOSIS — R7989 Other specified abnormal findings of blood chemistry: Secondary | ICD-10-CM

## 2021-09-26 ENCOUNTER — Other Ambulatory Visit: Payer: Self-pay

## 2021-09-26 ENCOUNTER — Ambulatory Visit (HOSPITAL_COMMUNITY): Payer: Medicare Other | Admitting: Physical Therapy

## 2021-09-26 DIAGNOSIS — R262 Difficulty in walking, not elsewhere classified: Secondary | ICD-10-CM

## 2021-09-26 DIAGNOSIS — I890 Lymphedema, not elsewhere classified: Secondary | ICD-10-CM | POA: Diagnosis not present

## 2021-09-26 NOTE — Therapy (Signed)
[facility] [address] [place], [place], [address] Phone: [phone]   Fax:  [phone]  Physical Therapy Treatment  Patient Details  Name: [name] MRN: [id number] Date of Birth: [birth year] Referring Provider (PT): Stana Bunting   Encounter Date: 09/26/2021   PT End of Session - 09/26/21 1130     Visit Number 2    Number of Visits 15    Date for PT Re-Evaluation 10/29/21    Authorization Type UHC medicare    Progress Note Due on Visit 10    PT Start Time 0830    PT Stop Time 1000    PT Time Calculation (min) 90 min    Activity Tolerance Patient tolerated treatment well    Behavior During Therapy [facility] for tasks assessed/performed             Past Medical History:  Diagnosis Date   Asthma    CHF (congestive heart failure) (HCC)    GERD (gastroesophageal reflux disease)    Internal hemorrhoids    Iron deficiency anemia    Osteoporosis    Peptic ulcer    Tubular adenoma of colon    Vertigo     Past Surgical History:  Procedure Laterality Date   BACK SURGERY     BREAST SURGERY     CHOLECYSTECTOMY     COLONOSCOPY WITH PROPOFOL N/A 06/02/2017   Procedure: COLONOSCOPY WITH PROPOFOL;  Surgeon: Jonathon Bellows, MD;  Location: [facility] ENDOSCOPY;  Service: Gastroenterology;  Laterality: N/A;   COLONOSCOPY WITH PROPOFOL N/A 01/28/2021   Procedure: COLONOSCOPY WITH PROPOFOL;  Surgeon: Jonathon Bellows, MD;  Location: [facility] ENDOSCOPY;  Service: Gastroenterology;  Laterality: N/A;   ESOPHAGOGASTRODUODENOSCOPY (EGD) WITH PROPOFOL N/A 07/01/2017   Procedure: ESOPHAGOGASTRODUODENOSCOPY (EGD) WITH PROPOFOL;  Surgeon: Jonathon Bellows, MD;  Location: [facility] ENDOSCOPY;  Service: Gastroenterology;  Laterality: N/A;   ESOPHAGOGASTRODUODENOSCOPY (EGD) WITH PROPOFOL N/A 01/28/2021   Procedure: ESOPHAGOGASTRODUODENOSCOPY (EGD) WITH PROPOFOL;  Surgeon: Jonathon Bellows, MD;  Location: [facility] ENDOSCOPY;  Service: Gastroenterology;  Laterality: N/A;    FLEXIBLE SIGMOIDOSCOPY N/A 07/01/2017   Procedure: FLEXIBLE SIGMOIDOSCOPY;  Surgeon: Jonathon Bellows, MD;  Location: [facility] ENDOSCOPY;  Service: Gastroenterology;  Laterality: N/A;   KNEE SURGERY     mini gastric bypass     SHOULDER SURGERY     TONSILLECTOMY      There were no vitals filed for this visit.   Subjective Assessment - 09/26/21 1128     Subjective pt states she's having an US done next week as she is having issues with her liver.  States she's been extremely weak lately and has noticed her hair is thinning.    Currently in Pain? No/denies                               [facility] Adult PT Treatment/Exercise - 09/26/21 0001       Manual Therapy   Manual Therapy Manual Lymphatic Drainage (MLD);Compression Bandaging    Manual therapy comments completed seperate from all other aspects    Manual Lymphatic Drainage (MLD) to include supraclavicular, deep and superfical abdominal, inguinal axillary anastomosis followed by B LEboth anterior and posteriorly    Compression Bandaging multilayer short stretch bandages with 1/2" foam from foot to thighB                       PT Short Term Goals - 09/24/21 1600  PT SHORT TERM GOAL #1   Title Pt to be I in self manual techniques.    Time 2    Period Weeks    Status New    Target Date 10/08/21      PT SHORT TERM GOAL #2   Title PT to have decreased 1.5cm in thigh and leg,    Time 2    Period Weeks    Status New               PT Long Term Goals - 09/24/21 1601       PT LONG TERM GOAL #1   Title PT to have ankle foot wrap, LE and thigh juxtafit and be able to Timmothy Sours I    Time 5    Period Weeks    Status New    Target Date 10/29/21      PT LONG TERM GOAL #2   Title PT to have lost between 3 cm from thigh and leg measurements on average,    Time 5    Period Weeks    Status New      PT LONG TERM GOAL #3   Title PT pain to be no greater than a 6/10 in LE    Time 5    Period Weeks     Status New                   Plan - 09/26/21 1131     Clinical Impression Statement Pt with continued induration, especially distally into lower legs and ankles.  Informed pt that therapist had faxed over order to [facility] yesterday for juxtafits.  Manual lymph drainage and bandaging completed for full bil LEs.  pt reported comfort with bandaging    Personal Factors and Comorbidities Comorbidity 2;Comorbidity 3+;Fitness;Time since onset of injury/illness/exacerbation    Comorbidities gastric bypass followed by plastic surgery to remove excessive skin as pt lost over 250#, OA,    Examination-Activity Limitations Dressing;Locomotion Level;Stand    Examination-Participation Restrictions Cleaning;Community Activity;Shop;Meal Prep    Stability/Clinical Decision Making Evolving/Moderate complexity    Rehab Potential Good    PT Frequency 3x / week    PT Duration 6 weeks    PT Treatment/Interventions Manual techniques;Therapeutic activities;Patient/family education;Manual lymph drainage;Therapeutic exercise    PT Next Visit Plan CDT measure on WEd.  Give pt [facility] number to call and schedule appt.  Inform of added appt on Friday 2/1 at 1:45.  Pt is also missing appts for Monday 1/23, 1/30 and Friday 2/3.    PT Home Exercise Plan ankle pump, LAQ, hip ab/adduction, march, diaphragmic breathing and lymphatic squeeze.; self manual    Consulted and Agree with Plan of Care Patient             Patient will benefit from skilled therapeutic intervention in order to improve the following deficits and impairments:  Pain, Difficulty walking, Increased edema  Visit Diagnosis: Lymphedema, not elsewhere classified  Difficulty in walking, not elsewhere classified     Problem List Patient Active Problem List   Diagnosis Date Noted   Unilateral primary osteoarthritis, unspecified knee 05/06/2021   Acute exacerbation of chronic obstructive pulmonary disease (COPD) ([place]) 04/08/2021   History  of anemia 04/08/2021   History of asthma 04/08/2021   History of COVID-19 04/08/2021   Shortness of breath 04/08/2021   Acute COVID-19 03/08/2021   Cough 03/08/2021   Fever 03/08/2021   Colon cancer screening 01/08/2021   Arthritis of right hand 11/15/2019  Age-related osteoporosis without current pathological fracture 10/30/2019   Left-sided weakness    Stroke-like symptoms 02/24/2018   HTN (hypertension) 02/24/2018   Primary osteoarthritis of both hands 11/03/2017   Dupuytren's contracture of left hand 11/03/2017   DDD (degenerative disc disease), lumbar 11/03/2017   History of gastric bypass 11/03/2017   Lymphedema 09/17/2017   History of vertebral fracture 09/17/2017   History of osteoporosis 09/17/2017   Iron deficiency anemia due to chronic blood loss 06/28/2017   Lower GI bleed 05/31/2017   Rectal mass 05/31/2017   Asthma 05/31/2017   GERD (gastroesophageal reflux disease) 05/31/2017   Closed fracture of upper end of humerus 09/23/2016   Tendinitis 09/23/2016   Patulous eustachian tube of right ear 11/07/2015   Superior semicircular canal dehiscence, bilateral 11/07/2015   Vitamin D deficiency 08/20/2014   Secondary hyperparathyroidism ([place]) 08/20/2014   Teena Irani, PTA/CLT, WTA [phone]  Teena Irani, PTA 09/26/2021, 11:31 AM  [facility] [address] [place], [place], [address] Phone: [phone]   Fax:  [phone]  Name: [name] MRN: [id number] Date of Birth: Apr 26, 1951

## 2021-09-29 ENCOUNTER — Ambulatory Visit (HOSPITAL_COMMUNITY): Payer: Medicare Other | Admitting: Physical Therapy

## 2021-09-29 ENCOUNTER — Other Ambulatory Visit: Payer: Self-pay

## 2021-09-29 DIAGNOSIS — I890 Lymphedema, not elsewhere classified: Secondary | ICD-10-CM

## 2021-09-29 NOTE — Therapy (Signed)
[facility] [place], [place], [address] Phone: [phone]   Fax:  [phone]  Physical Therapy Treatment  Patient Details  Name: [name] MRN: [id number] Date of Birth: [birth year] Referring Provider (PT): Stana Bunting   Encounter Date: 09/29/2021   PT End of Session - 09/29/21 1600     Visit Number 3    Number of Visits 15    Date for PT Re-Evaluation 10/29/21    Authorization Type UHC medicare    Progress Note Due on Visit 10    PT Start Time 1320    PT Stop Time 1440    PT Time Calculation (min) 80 min    Activity Tolerance Patient tolerated treatment well    Behavior During Therapy [facility] for tasks assessed/performed             Past Medical History:  Diagnosis Date   Asthma    CHF (congestive heart failure) (HCC)    GERD (gastroesophageal reflux disease)    Internal hemorrhoids    Iron deficiency anemia    Osteoporosis    Peptic ulcer    Tubular adenoma of colon    Vertigo     Past Surgical History:  Procedure Laterality Date   BACK SURGERY     BREAST SURGERY     CHOLECYSTECTOMY     COLONOSCOPY WITH PROPOFOL N/A 06/02/2017   Procedure: COLONOSCOPY WITH PROPOFOL;  Surgeon: Jonathon Bellows, MD;  Location: [facility] ENDOSCOPY;  Service: Gastroenterology;  Laterality: N/A;   COLONOSCOPY WITH PROPOFOL N/A 01/28/2021   Procedure: COLONOSCOPY WITH PROPOFOL;  Surgeon: Jonathon Bellows, MD;  Location: [facility] ENDOSCOPY;  Service: Gastroenterology;  Laterality: N/A;   ESOPHAGOGASTRODUODENOSCOPY (EGD) WITH PROPOFOL N/A 07/01/2017   Procedure: ESOPHAGOGASTRODUODENOSCOPY (EGD) WITH PROPOFOL;  Surgeon: Jonathon Bellows, MD;  Location: [facility] ENDOSCOPY;  Service: Gastroenterology;  Laterality: N/A;   ESOPHAGOGASTRODUODENOSCOPY (EGD) WITH PROPOFOL N/A 01/28/2021   Procedure: ESOPHAGOGASTRODUODENOSCOPY (EGD) WITH PROPOFOL;  Surgeon: Jonathon Bellows, MD;  Location: [facility] ENDOSCOPY;  Service: Gastroenterology;  Laterality: N/A;    FLEXIBLE SIGMOIDOSCOPY N/A 07/01/2017   Procedure: FLEXIBLE SIGMOIDOSCOPY;  Surgeon: Jonathon Bellows, MD;  Location: [facility] ENDOSCOPY;  Service: Gastroenterology;  Laterality: N/A;   KNEE SURGERY     mini gastric bypass     SHOULDER SURGERY     TONSILLECTOMY      There were no vitals filed for this visit.   Subjective Assessment - 09/29/21 1536     Subjective Pt states she has some studies and appts coming up due to her declining health.  Was able to schedule remaining appointments she was missing.    Currently in Pain? No/denies                               [facility] Adult PT Treatment/Exercise - 09/29/21 0001       Manual Therapy   Manual Therapy Manual Lymphatic Drainage (MLD);Compression Bandaging    Manual therapy comments completed seperate from all other aspects    Manual Lymphatic Drainage (MLD) to include supraclavicular, deep and superfical abdominal, inguinal axillary anastomosis followed by B LEboth anterior and posteriorly    Compression Bandaging multilayer short stretch bandages with 1/2" foam from foot to thighB                       PT Short Term Goals - 09/24/21 1600       PT SHORT TERM GOAL #  1   Title Pt to be I in self manual techniques.    Time 2    Period Weeks    Status New    Target Date 10/08/21      PT SHORT TERM GOAL #2   Title PT to have decreased 1.5cm in thigh and leg,    Time 2    Period Weeks    Status New               PT Long Term Goals - 09/24/21 1601       PT LONG TERM GOAL #1   Title PT to have ankle foot wrap, LE and thigh juxtafit and be able to Timmothy Sours I    Time 5    Period Weeks    Status New    Target Date 10/29/21      PT LONG TERM GOAL #2   Title PT to have lost between 3 cm from thigh and leg measurements on average,    Time 5    Period Weeks    Status New      PT LONG TERM GOAL #3   Title PT pain to be no greater than a 6/10 in LE    Time 5    Period Weeks    Status New                    Plan - 09/29/21 1558     Clinical Impression Statement continued with manual lymph drainage and compression for bil LE's.  Pt with questions regarding wearing compression in summer and explained that heat increases edema/induration.  pt verbalized understanding.  Pt states [facility] if she is unable to get in touch with [facility] regarding her juxtafits.  Encoruaged to call and given number for [facility].    Personal Factors and Comorbidities Comorbidity 2;Comorbidity 3+;Fitness;Time since onset of injury/illness/exacerbation    Comorbidities gastric bypass followed by plastic surgery to remove excessive skin as pt lost over 250#, OA,    Examination-Activity Limitations Dressing;Locomotion Level;Stand    Examination-Participation Restrictions Cleaning;Community Activity;Shop;Meal Prep    Stability/Clinical Decision Making Evolving/Moderate complexity    Rehab Potential Good    PT Frequency 3x / week    PT Duration 6 weeks    PT Treatment/Interventions Manual techniques;Therapeutic activities;Patient/family education;Manual lymph drainage;Therapeutic exercise    PT Next Visit Plan Continue with complete lymphatic therapy.  Measure every Wednesday.    PT Home Exercise Plan ankle pump, LAQ, hip ab/adduction, march, diaphragmic breathing and lymphatic squeeze.; self manual    Consulted and Agree with Plan of Care Patient             Patient will benefit from skilled therapeutic intervention in order to improve the following deficits and impairments:  Pain, Difficulty walking, Increased edema  Visit Diagnosis: Lymphedema, not elsewhere classified     Problem List Patient Active Problem List   Diagnosis Date Noted   Unilateral primary osteoarthritis, unspecified knee 05/06/2021   Acute exacerbation of chronic obstructive pulmonary disease (COPD) ([place]) 04/08/2021   History of anemia 04/08/2021   History of asthma 04/08/2021   History of COVID-19  04/08/2021   Shortness of breath 04/08/2021   Acute COVID-19 03/08/2021   Cough 03/08/2021   Fever 03/08/2021   Colon cancer screening 01/08/2021   Arthritis of right hand 11/15/2019   Age-related osteoporosis without current pathological fracture 10/30/2019   Left-sided weakness    Stroke-like symptoms 02/24/2018   HTN (hypertension) 02/24/2018  Primary osteoarthritis of both hands 11/03/2017   Dupuytren's contracture of left hand 11/03/2017   DDD (degenerative disc disease), lumbar 11/03/2017   History of gastric bypass 11/03/2017   Lymphedema 09/17/2017   History of vertebral fracture 09/17/2017   History of osteoporosis 09/17/2017   Iron deficiency anemia due to chronic blood loss 06/28/2017   Lower GI bleed 05/31/2017   Rectal mass 05/31/2017   Asthma 05/31/2017   GERD (gastroesophageal reflux disease) 05/31/2017   Closed fracture of upper end of humerus 09/23/2016   Tendinitis 09/23/2016   Patulous eustachian tube of right ear 11/07/2015   Superior semicircular canal dehiscence, 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 [place], [place], [address] Phone: [phone]   Fax:  [phone]  Name: [name] MRN: [id number] Date of Birth: [birth year]

## 2021-10-01 ENCOUNTER — Other Ambulatory Visit: Payer: Self-pay

## 2021-10-01 ENCOUNTER — Ambulatory Visit (HOSPITAL_COMMUNITY): Payer: Medicare Other | Admitting: Physical Therapy

## 2021-10-01 DIAGNOSIS — I890 Lymphedema, not elsewhere classified: Secondary | ICD-10-CM | POA: Diagnosis not present

## 2021-10-01 NOTE — Therapy (Signed)
[facility] [address] [place], [place], [address] Phone: [phone]   Fax:  [phone]  Physical Therapy Treatment  Patient Details  Name: [name] MRN: [id number] Date of Birth: 04-02-1951 Referring Provider (PT): Stana Bunting   Encounter Date: 10/01/2021   PT End of Session - 10/01/21 1503     Visit Number 4    Number of Visits 15    Date for PT Re-Evaluation 10/29/21    Authorization Type UHC medicare    Progress Note Due on Visit 10    PT Start Time 1320    PT Stop Time 1445    PT Time Calculation (min) 85 min    Activity Tolerance Patient tolerated treatment well    Behavior During Therapy [facility] for tasks assessed/performed             Past Medical History:  Diagnosis Date   Asthma    CHF (congestive heart failure) (HCC)    GERD (gastroesophageal reflux disease)    Internal hemorrhoids    Iron deficiency anemia    Osteoporosis    Peptic ulcer    Tubular adenoma of colon    Vertigo     Past Surgical History:  Procedure Laterality Date   BACK SURGERY     BREAST SURGERY     CHOLECYSTECTOMY     COLONOSCOPY WITH PROPOFOL N/A 06/02/2017   Procedure: COLONOSCOPY WITH PROPOFOL;  Surgeon: Jonathon Bellows, MD;  Location: [facility] ENDOSCOPY;  Service: Gastroenterology;  Laterality: N/A;   COLONOSCOPY WITH PROPOFOL N/A 01/28/2021   Procedure: COLONOSCOPY WITH PROPOFOL;  Surgeon: Jonathon Bellows, MD;  Location: [facility] ENDOSCOPY;  Service: Gastroenterology;  Laterality: N/A;   ESOPHAGOGASTRODUODENOSCOPY (EGD) WITH PROPOFOL N/A 07/01/2017   Procedure: ESOPHAGOGASTRODUODENOSCOPY (EGD) WITH PROPOFOL;  Surgeon: Jonathon Bellows, MD;  Location: [facility] ENDOSCOPY;  Service: Gastroenterology;  Laterality: N/A;   ESOPHAGOGASTRODUODENOSCOPY (EGD) WITH PROPOFOL N/A 01/28/2021   Procedure: ESOPHAGOGASTRODUODENOSCOPY (EGD) WITH PROPOFOL;  Surgeon: Jonathon Bellows, MD;  Location: [facility] ENDOSCOPY;  Service: Gastroenterology;  Laterality: N/A;    FLEXIBLE SIGMOIDOSCOPY N/A 07/01/2017   Procedure: FLEXIBLE SIGMOIDOSCOPY;  Surgeon: Jonathon Bellows, MD;  Location: [facility] ENDOSCOPY;  Service: Gastroenterology;  Laterality: N/A;   KNEE SURGERY     mini gastric bypass     SHOULDER SURGERY     TONSILLECTOMY      There were no vitals filed for this visit.   Subjective Assessment - 10/01/21 1500     Subjective Pt states she is doing well; returns to MD tomorrow and will ask about signing her juxtafit order.  STates she gets her liver US on Friday.    Currently in Pain? No/denies                   LYMPHEDEMA/ONCOLOGY QUESTIONNAIRE - 10/01/21 1501       Right Lower Extremity Lymphedema   20 cm Proximal to Suprapatella 58.7 cm   was 58   10 cm Proximal to Suprapatella 53 cm   was 52.4   At Midpatella/Popliteal Crease 45 cm   was44.5   30 cm Proximal to Floor at Lateral Plantar Foot 41.8 cm   42.5   20 cm Proximal to Floor at Lateral Plantar Foot 41.7 1   39.8   10 cm Proximal to Floor at Lateral Malleoli 32 cm   30   Circumference of ankle/heel 33.8 cm.   33.8   5 cm Proximal to 1st MTP Joint 22 cm   22   Across  MTP Joint 22.5 cm   22.5     Left Lower Extremity Lymphedema   20 cm Proximal to Suprapatella 58 cm   was 58   10 cm Proximal to Suprapatella 55 cm   53   At Midpatella/Popliteal Crease 47 cm   44   30 cm Proximal to Floor at Lateral Plantar Foot 44 cm   44.5   20 cm Proximal to Floor at Lateral Plantar Foot 41 cm   43.5   10 cm Proximal to Floor at Lateral Malleoli 33.4 cm   30.5   Circumference of ankle/heel 35 cm.   34   5 cm Proximal to 1st MTP Joint 23.2 cm   22.8   Across MTP Joint 23 cm   23.2                       [facility] Adult PT Treatment/Exercise - 10/01/21 0001       Manual Therapy   Manual Therapy Manual Lymphatic Drainage (MLD);Compression Bandaging    Manual therapy comments completed seperate from all other aspects    Manual Lymphatic Drainage (MLD) to include supraclavicular, deep  and superfical abdominal, inguinal axillary anastomosis followed by B LEboth anterior and posteriorly    Compression Bandaging multilayer short stretch bandages with 1/2" foam from foot to thighB    Other Manual Therapy measurement                       PT Short Term Goals - 09/24/21 1600       PT SHORT TERM GOAL #1   Title Pt to be I in self manual techniques.    Time 2    Period Weeks    Status New    Target Date 10/08/21      PT SHORT TERM GOAL #2   Title PT to have decreased 1.5cm in thigh and leg,    Time 2    Period Weeks    Status New               PT Long Term Goals - 09/24/21 1601       PT LONG TERM GOAL #1   Title PT to have ankle foot wrap, LE and thigh juxtafit and be able to Timmothy Sours I    Time 5    Period Weeks    Status New    Target Date 10/29/21      PT LONG TERM GOAL #2   Title PT to have lost between 3 cm from thigh and leg measurements on average,    Time 5    Period Weeks    Status New      PT LONG TERM GOAL #3   Title PT pain to be no greater than a 6/10 in LE    Time 5    Period Weeks    Status New                   Plan - 10/01/21 1512     Clinical Impression Statement LE's measured today with overall reduction as compared to last weeks evaluation.  PT reports less tightness and sensitivity in her LE"s as well.  Cotinued with manual lymph drainage and compression bandaging to thighs for bil LE's.    Personal Factors and Comorbidities Comorbidity 2;Comorbidity 3+;Fitness;Time since onset of injury/illness/exacerbation    Comorbidities gastric bypass followed by plastic surgery to remove excessive skin as pt lost over 250#, OA,  Examination-Activity Limitations Dressing;Locomotion Level;Stand    Examination-Participation Restrictions Cleaning;Community Activity;Shop;Meal Prep    Stability/Clinical Decision Making Evolving/Moderate complexity    Rehab Potential Good    PT Frequency 3x / week    PT Duration 6 weeks     PT Treatment/Interventions Manual techniques;Therapeutic activities;Patient/family education;Manual lymph drainage;Therapeutic exercise    PT Next Visit Plan Continue with complete lymphatic therapy.  Measure every Wednesday.    PT Home Exercise Plan ankle pump, LAQ, hip ab/adduction, march, diaphragmic breathing and lymphatic squeeze.; self manual    Consulted and Agree with Plan of Care Patient             Patient will benefit from skilled therapeutic intervention in order to improve the following deficits and impairments:  Pain, Difficulty walking, Increased edema  Visit Diagnosis: Lymphedema, not elsewhere classified     Problem List Patient Active Problem List   Diagnosis Date Noted   Unilateral primary osteoarthritis, unspecified knee 05/06/2021   Acute exacerbation of chronic obstructive pulmonary disease (COPD) ([place]) 04/08/2021   History of anemia 04/08/2021   History of asthma 04/08/2021   History of COVID-19 04/08/2021   Shortness of breath 04/08/2021   Acute COVID-19 03/08/2021   Cough 03/08/2021   Fever 03/08/2021   Colon cancer screening 01/08/2021   Arthritis of right hand 11/15/2019   Age-related osteoporosis without current pathological fracture 10/30/2019   Left-sided weakness    Stroke-like symptoms 02/24/2018   HTN (hypertension) 02/24/2018   Primary osteoarthritis of both hands 11/03/2017   Dupuytren's contracture of left hand 11/03/2017   DDD (degenerative disc disease), lumbar 11/03/2017   History of gastric bypass 11/03/2017   Lymphedema 09/17/2017   History of vertebral fracture 09/17/2017   History of osteoporosis 09/17/2017   Iron deficiency anemia due to chronic blood loss 06/28/2017   Lower GI bleed 05/31/2017   Rectal mass 05/31/2017   Asthma 05/31/2017   GERD (gastroesophageal reflux disease) 05/31/2017   Closed fracture of upper end of humerus 09/23/2016   Tendinitis 09/23/2016   Patulous eustachian tube of right ear 11/07/2015    Superior semicircular canal dehiscence, 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

## 2021-10-03 ENCOUNTER — Other Ambulatory Visit: Payer: Self-pay

## 2021-10-03 ENCOUNTER — Ambulatory Visit (HOSPITAL_COMMUNITY)
Admission: RE | Admit: 2021-10-03 | Discharge: 2021-10-03 | Disposition: A | Discharge status: Home / Self Care | Payer: Medicare Other | Source: Ambulatory Visit | Attending: Family Medicine | Admitting: Family Medicine

## 2021-10-03 ENCOUNTER — Ambulatory Visit (HOSPITAL_COMMUNITY): Payer: Medicare Other

## 2021-10-03 ENCOUNTER — Encounter (HOSPITAL_COMMUNITY): Payer: Self-pay

## 2021-10-03 DIAGNOSIS — I890 Lymphedema, not elsewhere classified: Secondary | ICD-10-CM | POA: Diagnosis not present

## 2021-10-03 DIAGNOSIS — R7989 Other specified abnormal findings of blood chemistry: Secondary | ICD-10-CM | POA: Insufficient documentation

## 2021-10-03 NOTE — Therapy (Signed)
[place], [place], [address] Phone: [phone]   Fax:  [phone]  Physical Therapy Treatment  Patient Details  Name: [name] MRN: [id number] Date of Birth: 1950-10-31 Referring Provider (PT): Stana Bunting   Encounter Date: 10/03/2021   PT End of Session - 10/03/21 1800     Visit Number 5    Number of Visits 15    Date for PT Re-Evaluation 10/29/21    Authorization Type UHC medicare    Progress Note Due on Visit 10    PT Start Time 1410    PT Stop Time 1530    PT Time Calculation (min) 80 min    Activity Tolerance Patient tolerated treatment well    Behavior During Therapy [facility] for tasks assessed/performed             Past Medical History:  Diagnosis Date   Asthma    CHF (congestive heart failure) (HCC)    GERD (gastroesophageal reflux disease)    Internal hemorrhoids    Iron deficiency anemia    Osteoporosis    Peptic ulcer    Tubular adenoma of colon    Vertigo     Past Surgical History:  Procedure Laterality Date   BACK SURGERY     BREAST SURGERY     CHOLECYSTECTOMY     COLONOSCOPY WITH PROPOFOL N/A 06/02/2017   Procedure: COLONOSCOPY WITH PROPOFOL;  Surgeon: Jonathon Bellows, MD;  Location: [facility] ENDOSCOPY;  Service: Gastroenterology;  Laterality: N/A;   COLONOSCOPY WITH PROPOFOL N/A 01/28/2021   Procedure: COLONOSCOPY WITH PROPOFOL;  Surgeon: Jonathon Bellows, MD;  Location: [facility] ENDOSCOPY;  Service: Gastroenterology;  Laterality: N/A;   ESOPHAGOGASTRODUODENOSCOPY (EGD) WITH PROPOFOL N/A 07/01/2017   Procedure: ESOPHAGOGASTRODUODENOSCOPY (EGD) WITH PROPOFOL;  Surgeon: Jonathon Bellows, MD;  Location: [facility] ENDOSCOPY;  Service: Gastroenterology;  Laterality: N/A;   ESOPHAGOGASTRODUODENOSCOPY (EGD) WITH PROPOFOL N/A 01/28/2021   Procedure: ESOPHAGOGASTRODUODENOSCOPY (EGD) WITH PROPOFOL;  Surgeon: Jonathon Bellows, MD;  Location: [facility] ENDOSCOPY;  Service: Gastroenterology;  Laterality: N/A;    FLEXIBLE SIGMOIDOSCOPY N/A 07/01/2017   Procedure: FLEXIBLE SIGMOIDOSCOPY;  Surgeon: Jonathon Bellows, MD;  Location: [facility] ENDOSCOPY;  Service: Gastroenterology;  Laterality: N/A;   KNEE SURGERY     mini gastric bypass     SHOULDER SURGERY     TONSILLECTOMY      There were no vitals filed for this visit.   Subjective Assessment - 10/03/21 1758     Subjective Pt stated she has been to MD concerning issues wiht blood work/US done wiht liver today and has had blood in urine, feels weak today wiht increased fatigue.    Pertinent History lymphedema, tumor of the brain , CHF,    Patient Stated Goals to get her swelling down    Currently in Pain? No/denies                               [facility] Adult PT Treatment/Exercise - 10/03/21 0001       Manual Therapy   Manual Therapy Manual Lymphatic Drainage (MLD);Compression Bandaging    Manual therapy comments completed seperate from all other aspects    Manual Lymphatic Drainage (MLD) to include supraclavicular, deep and superfical abdominal, inguinal axillary anastomosis followed by B LEboth anterior and posteriorly    Compression Bandaging multilayer short stretch bandages with 1/2" foam from foot to thighB  PT Short Term Goals - 09/24/21 1600       PT SHORT TERM GOAL #1   Title Pt to be I in self manual techniques.    Time 2    Period Weeks    Status New    Target Date 10/08/21      PT SHORT TERM GOAL #2   Title PT to have decreased 1.5cm in thigh and leg,    Time 2    Period Weeks    Status New               PT Long Term Goals - 09/24/21 1601       PT LONG TERM GOAL #1   Title PT to have ankle foot wrap, LE and thigh juxtafit and be able to Timmothy Sours I    Time 5    Period Weeks    Status New    Target Date 10/29/21      PT LONG TERM GOAL #2   Title PT to have lost between 3 cm from thigh and leg measurements on average,    Time 5    Period Weeks    Status New      PT  LONG TERM GOAL #3   Title PT pain to be no greater than a 6/10 in LE    Time 5    Period Weeks    Status New                   Plan - 10/03/21 1801     Clinical Impression Statement Manual lymphedema decongestive techniques complete anterior and posterior with induraction present especially distal extremities.  Multilayer short stretch bandages applied to BLE toes to thighs as pt reorts increased swelling in toes.  Pt given copy of signed order to make apt with [employer] for Juxt[employer].  Reports of comfort at EOS.    Personal Factors and Comorbidities Comorbidity 2;Comorbidity 3+;Fitness;Time since onset of injury/illness/exacerbation    Comorbidities gastric bypass followed by plastic surgery to remove excessive skin as pt lost over 250#, OA,    Examination-Activity Limitations Dressing;Locomotion Level;Stand    Examination-Participation Restrictions Cleaning;Community Activity;Shop;Meal Prep    Stability/Clinical Decision Making Evolving/Moderate complexity    Clinical Decision Making Moderate    Rehab Potential Good    PT Frequency 3x / week    PT Duration 6 weeks    PT Treatment/Interventions Manual techniques;Therapeutic activities;Patient/family education;Manual lymph drainage;Therapeutic exercise    PT Next Visit Plan Continue with complete lymphatic therapy.  Measure every Wednesday.    PT Home Exercise Plan ankle pump, LAQ, hip ab/adduction, march, diaphragmic breathing and lymphatic squeeze.; self manual             Patient will benefit from skilled therapeutic intervention in order to improve the following deficits and impairments:  Pain, Difficulty walking, Increased edema  Visit Diagnosis: Lymphedema, not elsewhere classified     Problem List Patient Active Problem List   Diagnosis Date Noted   Unilateral primary osteoarthritis, unspecified knee 05/06/2021   Acute exacerbation of chronic obstructive pulmonary disease (COPD) ([place]) 04/08/2021   History of  anemia 04/08/2021   History of asthma 04/08/2021   History of COVID-19 04/08/2021   Shortness of breath 04/08/2021   Acute COVID-19 03/08/2021   Cough 03/08/2021   Fever 03/08/2021   Colon cancer screening 01/08/2021   Arthritis of right hand 11/15/2019   Age-related osteoporosis without current pathological fracture 10/30/2019   Left-sided weakness    Stroke-like symptoms  02/24/2018   HTN (hypertension) 02/24/2018   Primary osteoarthritis of both hands 11/03/2017   Dupuytren's contracture of left hand 11/03/2017   DDD (degenerative disc disease), lumbar 11/03/2017   History of gastric bypass 11/03/2017   Lymphedema 09/17/2017   History of vertebral fracture 09/17/2017   History of osteoporosis 09/17/2017   Iron deficiency anemia due to chronic blood loss 06/28/2017   Lower GI bleed 05/31/2017   Rectal mass 05/31/2017   Asthma 05/31/2017   GERD (gastroesophageal reflux disease) 05/31/2017   Closed fracture of upper end of humerus 09/23/2016   Tendinitis 09/23/2016   Patulous eustachian tube of right ear 11/07/2015   Superior semicircular canal dehiscence, bilateral 11/07/2015   Vitamin D deficiency 08/20/2014   Secondary hyperparathyroidism ([place]) 08/20/2014   Ihor Austin, LPTA/CLT; CBIS [phone]  Aldona Lento, PTA 10/03/2021, 6:04 PM  [place] [address] [place], [place], [address] Phone: [phone]   Fax:  [phone]  Name: [name] MRN: [id number] Date of Birth: 04-21-1951

## 2021-10-03 NOTE — Progress Notes (Deleted)
Office Visit Note  Patient: [name]             Date of Birth: Feb 03, 1951           MRN: [id number]             PCP: Bonnita Hollow, MD Referring: Florian Buff* Visit Date: 10/16/2021 Occupation: @GUAROCC @  Subjective:  No chief complaint on file.   History of Present Illness: [name] is a 71 y.o. female ***   Activities of Daily Living:  Patient reports morning stiffness for *** {minute/hour:19697}.   Patient {ACTIONS;DENIES/REPORTS:21021675::"Denies"} nocturnal pain.  Difficulty dressing/grooming: {ACTIONS;DENIES/REPORTS:21021675::"Denies"} Difficulty climbing stairs: {ACTIONS;DENIES/REPORTS:21021675::"Denies"} Difficulty getting out of chair: {ACTIONS;DENIES/REPORTS:21021675::"Denies"} Difficulty using hands for taps, buttons, cutlery, and/or writing: {ACTIONS;DENIES/REPORTS:21021675::"Denies"}  No Rheumatology ROS completed.   PMFS History:  Patient Active Problem List   Diagnosis Date Noted   Unilateral primary osteoarthritis, unspecified knee 05/06/2021   Acute exacerbation of chronic obstructive pulmonary disease (COPD) ([place]) 04/08/2021   History of anemia 04/08/2021   History of asthma 04/08/2021   History of COVID-19 04/08/2021   Shortness of breath 04/08/2021   Acute COVID-19 03/08/2021   Cough 03/08/2021   Fever 03/08/2021   Colon cancer screening 01/08/2021   Arthritis of right hand 11/15/2019   Age-related osteoporosis without current pathological fracture 10/30/2019   Left-sided weakness    Stroke-like symptoms 02/24/2018   HTN (hypertension) 02/24/2018   Primary osteoarthritis of both hands 11/03/2017   Dupuytren's contracture of left hand 11/03/2017   DDD (degenerative disc disease), lumbar 11/03/2017   History of gastric bypass 11/03/2017   Lymphedema 09/17/2017   History of vertebral fracture 09/17/2017   History of osteoporosis 09/17/2017   Iron deficiency anemia due to chronic blood loss 06/28/2017   Lower  GI bleed 05/31/2017   Rectal mass 05/31/2017   Asthma 05/31/2017   GERD (gastroesophageal reflux disease) 05/31/2017   Closed fracture of upper end of humerus 09/23/2016   Tendinitis 09/23/2016   Patulous eustachian tube of right ear 11/07/2015   Superior semicircular canal dehiscence, bilateral 11/07/2015   Vitamin D deficiency 08/20/2014   Secondary hyperparathyroidism ([place]) 08/20/2014    Past Medical History:  Diagnosis Date   Asthma    CHF (congestive heart failure) (HCC)    GERD (gastroesophageal reflux disease)    Internal hemorrhoids    Iron deficiency anemia    Osteoporosis    Peptic ulcer    Tubular adenoma of colon    Vertigo     Family History  Problem Relation Age of Onset   Hypertension Other    Stroke Other    Diabetes Other    Heart attack Other    Obesity Other    Diabetes Father    Heart disease Father    Colon cancer Father    Breast cancer Paternal Grandmother    Healthy Daughter    Stomach cancer Neg Hx    Pancreatic cancer Neg Hx    Past Surgical History:  Procedure Laterality Date   BACK SURGERY     BREAST SURGERY     CHOLECYSTECTOMY     COLONOSCOPY WITH PROPOFOL N/A 06/02/2017   Procedure: COLONOSCOPY WITH PROPOFOL;  Surgeon: Jonathon Bellows, MD;  Location: [facility] ENDOSCOPY;  Service: Gastroenterology;  Laterality: N/A;   COLONOSCOPY WITH PROPOFOL N/A 01/28/2021   Procedure: COLONOSCOPY WITH PROPOFOL;  Surgeon: Jonathon Bellows, MD;  Location: [facility] ENDOSCOPY;  Service: Gastroenterology;  Laterality: N/A;   ESOPHAGOGASTRODUODENOSCOPY (EGD) WITH PROPOFOL N/A 07/01/2017  Procedure: ESOPHAGOGASTRODUODENOSCOPY (EGD) WITH PROPOFOL;  Surgeon: Jonathon Bellows, MD;  Location: [facility] ENDOSCOPY;  Service: Gastroenterology;  Laterality: N/A;   ESOPHAGOGASTRODUODENOSCOPY (EGD) WITH PROPOFOL N/A 01/28/2021   Procedure: ESOPHAGOGASTRODUODENOSCOPY (EGD) WITH PROPOFOL;  Surgeon: Jonathon Bellows, MD;  Location: [facility] ENDOSCOPY;  Service: Gastroenterology;  Laterality: N/A;   FLEXIBLE  SIGMOIDOSCOPY N/A 07/01/2017   Procedure: FLEXIBLE SIGMOIDOSCOPY;  Surgeon: Jonathon Bellows, MD;  Location: [facility] ENDOSCOPY;  Service: Gastroenterology;  Laterality: N/A;   KNEE SURGERY     mini gastric bypass     SHOULDER SURGERY     TONSILLECTOMY     Social History   Social History Narrative   Not on file   Immunization History  Administered Date(s) Administered   DTaP / Hep B / IPV 02/21/2019, 08/29/2019, 03/14/2020, 10/15/2020, 05/01/2021     Objective: Vital Signs: There were no vitals taken for this visit.   Physical Exam   Musculoskeletal Exam: ***  CDAI Exam: CDAI Score: -- Patient Global: --; Provider Global: -- Swollen: --; Tender: -- Joint Exam 10/16/2021   No joint exam has been documented for this visit   There is currently no information documented on the homunculus. Go to the Rheumatology activity and complete the homunculus joint exam.  Investigation: No additional findings.  Imaging: No results found.  Recent Labs: Lab Results  Component Value Date   WBC 6.5 05/29/2021   HGB 12.1 05/29/2021   PLT 191 05/29/2021   NA 141 05/29/2021   K 4.0 05/29/2021   CL 108 05/29/2021   CO2 25 05/29/2021   GLUCOSE 77 05/29/2021   BUN 10 05/29/2021   CREATININE 0.62 05/29/2021   BILITOT 0.9 05/29/2021   ALKPHOS 66 05/29/2021   AST 31 05/29/2021   ALT 30 05/29/2021   PROT 4.7 (L) 05/29/2021   ALBUMIN 2.5 (L) 05/29/2021   CALCIUM 8.1 (L) 05/29/2021   GFRAA >60 05/02/2020    Speciality Comments: Osteoporosis followed by her endocrinologist Dr. Buddy Duty.  Procedures:  No procedures performed Allergies: Lac bovis, Prednisone, Sulfa antibiotics, Tape, and Zithromax [azithromycin]   Assessment / Plan:     Visit Diagnoses: No diagnosis found.  Orders: No orders of the defined types were placed in this encounter.  No orders of the defined types were placed in this encounter.   Face-to-face time spent with patient was *** minutes. Greater than 50% of time  was spent in counseling and coordination of care.  Follow-Up Instructions: No follow-ups on file.   Earnestine Mealing, CMA  Note - This record has been created using Editor, commissioning.  Chart creation errors have been sought, but may not always  have been located. Such creation errors do not reflect on  the standard of medical care.

## 2021-10-06 ENCOUNTER — Ambulatory Visit (HOSPITAL_COMMUNITY): Payer: Medicare Other | Admitting: Physical Therapy

## 2021-10-06 ENCOUNTER — Other Ambulatory Visit: Payer: Self-pay

## 2021-10-06 DIAGNOSIS — I890 Lymphedema, not elsewhere classified: Secondary | ICD-10-CM | POA: Diagnosis not present

## 2021-10-06 NOTE — Therapy (Signed)
[place], [place], [address] Phone: [phone]   Fax:  [phone]  Physical Therapy Treatment  Patient Details  Name: [name] MRN: [id number] Date of Birth: 10/24/1950 Referring Provider (PT): Stana Bunting   Encounter Date: 10/06/2021   PT End of Session - 10/06/21 1528     Visit Number 6    Number of Visits 15    Date for PT Re-Evaluation 10/29/21    Authorization Type UHC medicare    Progress Note Due on Visit 10    PT Start Time 0835    PT Stop Time 0955    PT Time Calculation (min) 80 min    Activity Tolerance Patient tolerated treatment well    Behavior During Therapy [facility] for tasks assessed/performed             Past Medical History:  Diagnosis Date   Asthma    CHF (congestive heart failure) (HCC)    GERD (gastroesophageal reflux disease)    Internal hemorrhoids    Iron deficiency anemia    Osteoporosis    Peptic ulcer    Tubular adenoma of colon    Vertigo     Past Surgical History:  Procedure Laterality Date   BACK SURGERY     BREAST SURGERY     CHOLECYSTECTOMY     COLONOSCOPY WITH PROPOFOL N/A 06/02/2017   Procedure: COLONOSCOPY WITH PROPOFOL;  Surgeon: Jonathon Bellows, MD;  Location: [facility] ENDOSCOPY;  Service: Gastroenterology;  Laterality: N/A;   COLONOSCOPY WITH PROPOFOL N/A 01/28/2021   Procedure: COLONOSCOPY WITH PROPOFOL;  Surgeon: Jonathon Bellows, MD;  Location: [facility] ENDOSCOPY;  Service: Gastroenterology;  Laterality: N/A;   ESOPHAGOGASTRODUODENOSCOPY (EGD) WITH PROPOFOL N/A 07/01/2017   Procedure: ESOPHAGOGASTRODUODENOSCOPY (EGD) WITH PROPOFOL;  Surgeon: Jonathon Bellows, MD;  Location: [facility] ENDOSCOPY;  Service: Gastroenterology;  Laterality: N/A;   ESOPHAGOGASTRODUODENOSCOPY (EGD) WITH PROPOFOL N/A 01/28/2021   Procedure: ESOPHAGOGASTRODUODENOSCOPY (EGD) WITH PROPOFOL;  Surgeon: Jonathon Bellows, MD;  Location: [facility] ENDOSCOPY;  Service: Gastroenterology;  Laterality: N/A;    FLEXIBLE SIGMOIDOSCOPY N/A 07/01/2017   Procedure: FLEXIBLE SIGMOIDOSCOPY;  Surgeon: Jonathon Bellows, MD;  Location: [facility] ENDOSCOPY;  Service: Gastroenterology;  Laterality: N/A;   KNEE SURGERY     mini gastric bypass     SHOULDER SURGERY     TONSILLECTOMY      There were no vitals filed for this visit.   Subjective Assessment - 10/06/21 1526     Subjective pt states she got her US done on Friday but has not gotten the results yet.  currently wtihout any pain; removed bandages last night.    Currently in Pain? No/denies                               [facility] Adult PT Treatment/Exercise - 10/06/21 0001       Manual Therapy   Manual Therapy Manual Lymphatic Drainage (MLD);Compression Bandaging    Manual therapy comments completed seperate from all other aspects    Manual Lymphatic Drainage (MLD) to include supraclavicular, deep and superfical abdominal, inguinal axillary anastomosis followed by B LEboth anterior and posteriorly    Compression Bandaging multilayer short stretch bandages with 1/2" foam from foot to thighB                       PT Short Term Goals - 09/24/21 1600       PT SHORT TERM GOAL #  1   Title Pt to be I in self manual techniques.    Time 2    Period Weeks    Status New    Target Date 10/08/21      PT SHORT TERM GOAL #2   Title PT to have decreased 1.5cm in thigh and leg,    Time 2    Period Weeks    Status New               PT Long Term Goals - 09/24/21 1601       PT LONG TERM GOAL #1   Title PT to have ankle foot wrap, LE and thigh juxtafit and be able to Timmothy Sours I    Time 5    Period Weeks    Status New    Target Date 10/29/21      PT LONG TERM GOAL #2   Title PT to have lost between 3 cm from thigh and leg measurements on average,    Time 5    Period Weeks    Status New      PT LONG TERM GOAL #3   Title PT pain to be no greater than a 6/10 in LE    Time 5    Period Weeks    Status New                    Plan - 10/06/21 1527     Clinical Impression Statement Continued with manual lymph drainage followed by compression bandaging to bilateral LEs.  Pt reported overall comfort with bandaging.   Noted tightness in lower groin and mons pubis area due to scar from weight loss surgery.  Suggested she try compression underwear or application of foam inside of undergarment to help soften induration.    Personal Factors and Comorbidities Comorbidity 2;Comorbidity 3+;Fitness;Time since onset of injury/illness/exacerbation    Comorbidities gastric bypass followed by plastic surgery to remove excessive skin as pt lost over 250#, OA,    Examination-Activity Limitations Dressing;Locomotion Level;Stand    Examination-Participation Restrictions Cleaning;Community Activity;Shop;Meal Prep    Stability/Clinical Decision Making Evolving/Moderate complexity    Rehab Potential Good    PT Frequency 3x / week    PT Duration 6 weeks    PT Treatment/Interventions Manual techniques;Therapeutic activities;Patient/family education;Manual lymph drainage;Therapeutic exercise    PT Next Visit Plan Continue with complete lymphatic therapy.  Measure every Wednesday.    PT Home Exercise Plan ankle pump, LAQ, hip ab/adduction, march, diaphragmic breathing and lymphatic squeeze.; self manual             Patient will benefit from skilled therapeutic intervention in order to improve the following deficits and impairments:  Pain, Difficulty walking, Increased edema  Visit Diagnosis: Lymphedema, not elsewhere classified     Problem List Patient Active Problem List   Diagnosis Date Noted   Unilateral primary osteoarthritis, unspecified knee 05/06/2021   Acute exacerbation of chronic obstructive pulmonary disease (COPD) ([place]) 04/08/2021   History of anemia 04/08/2021   History of asthma 04/08/2021   History of COVID-19 04/08/2021   Shortness of breath 04/08/2021   Acute COVID-19 03/08/2021   Cough  03/08/2021   Fever 03/08/2021   Colon cancer screening 01/08/2021   Arthritis of right hand 11/15/2019   Age-related osteoporosis without current pathological fracture 10/30/2019   Left-sided weakness    Stroke-like symptoms 02/24/2018   HTN (hypertension) 02/24/2018   Primary osteoarthritis of both hands 11/03/2017   Dupuytren's contracture of left hand 11/03/2017  DDD (degenerative disc disease), lumbar 11/03/2017   History of gastric bypass 11/03/2017   Lymphedema 09/17/2017   History of vertebral fracture 09/17/2017   History of osteoporosis 09/17/2017   Iron deficiency anemia due to chronic blood loss 06/28/2017   Lower GI bleed 05/31/2017   Rectal mass 05/31/2017   Asthma 05/31/2017   GERD (gastroesophageal reflux disease) 05/31/2017   Closed fracture of upper end of humerus 09/23/2016   Tendinitis 09/23/2016   Patulous eustachian tube of right ear 11/07/2015   Superior semicircular canal dehiscence, 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

## 2021-10-07 ENCOUNTER — Ambulatory Visit (INDEPENDENT_AMBULATORY_CARE_PROVIDER_SITE_OTHER): Payer: Medicare Other | Admitting: Gastroenterology

## 2021-10-07 ENCOUNTER — Encounter: Payer: Self-pay | Admitting: Gastroenterology

## 2021-10-07 ENCOUNTER — Telehealth: Payer: Self-pay | Admitting: Pulmonary Disease

## 2021-10-07 ENCOUNTER — Encounter (HOSPITAL_COMMUNITY): Payer: Medicare Other | Admitting: Physical Therapy

## 2021-10-07 VITALS — BP 122/80 | HR 105 | Temp 98.3°F | Wt 175.8 lb

## 2021-10-07 DIAGNOSIS — N200 Calculus of kidney: Secondary | ICD-10-CM | POA: Insufficient documentation

## 2021-10-07 DIAGNOSIS — M810 Age-related osteoporosis without current pathological fracture: Secondary | ICD-10-CM | POA: Insufficient documentation

## 2021-10-07 DIAGNOSIS — L03316 Cellulitis of umbilicus: Secondary | ICD-10-CM | POA: Insufficient documentation

## 2021-10-07 DIAGNOSIS — R7989 Other specified abnormal findings of blood chemistry: Secondary | ICD-10-CM | POA: Diagnosis not present

## 2021-10-07 DIAGNOSIS — G250 Essential tremor: Secondary | ICD-10-CM | POA: Insufficient documentation

## 2021-10-07 DIAGNOSIS — M545 Low back pain, unspecified: Secondary | ICD-10-CM | POA: Insufficient documentation

## 2021-10-07 DIAGNOSIS — E8809 Other disorders of plasma-protein metabolism, not elsewhere classified: Secondary | ICD-10-CM | POA: Diagnosis not present

## 2021-10-07 DIAGNOSIS — Z86010 Personal history of colonic polyps: Secondary | ICD-10-CM | POA: Insufficient documentation

## 2021-10-07 DIAGNOSIS — R188 Other ascites: Secondary | ICD-10-CM | POA: Diagnosis not present

## 2021-10-07 DIAGNOSIS — R233 Spontaneous ecchymoses: Secondary | ICD-10-CM | POA: Insufficient documentation

## 2021-10-07 DIAGNOSIS — G4700 Insomnia, unspecified: Secondary | ICD-10-CM | POA: Insufficient documentation

## 2021-10-07 DIAGNOSIS — Z9884 Bariatric surgery status: Secondary | ICD-10-CM | POA: Insufficient documentation

## 2021-10-07 DIAGNOSIS — K648 Other hemorrhoids: Secondary | ICD-10-CM | POA: Insufficient documentation

## 2021-10-07 DIAGNOSIS — E58 Dietary calcium deficiency: Secondary | ICD-10-CM | POA: Diagnosis not present

## 2021-10-07 DIAGNOSIS — E46 Unspecified protein-calorie malnutrition: Secondary | ICD-10-CM | POA: Insufficient documentation

## 2021-10-07 DIAGNOSIS — K279 Peptic ulcer, site unspecified, unspecified as acute or chronic, without hemorrhage or perforation: Secondary | ICD-10-CM | POA: Insufficient documentation

## 2021-10-07 DIAGNOSIS — M8448XA Pathological fracture, other site, initial encounter for fracture: Secondary | ICD-10-CM | POA: Insufficient documentation

## 2021-10-07 DIAGNOSIS — R609 Edema, unspecified: Secondary | ICD-10-CM | POA: Insufficient documentation

## 2021-10-07 NOTE — Progress Notes (Signed)
Jonathon Bellows MD, MRCP(U.K) [address]  [place]  [place],  [address]  Main: [phone]  Fax: [phone]   Primary Care Physician: Bonnita Hollow, MD  Primary Gastroenterologist:  Dr. Jonathon Bellows    Chief complaint: Discuss about results of ultrasound   HPI: [name] is a 71 y.o. female  Summary of history :   Initially referred and seen on 01/08/2021 for vomiting after eating.  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 2019 she was seen by [facility] and underwent hemorrhoidal banding.  .Does complain of heartburn despite being on famotidine.  Previously was on pantoprazole.  He says it was stopped due to concern about long-term usage.   Seen by the cancer center for iron deficiency anemia in March 2022 hemoglobin 13.1 g has received IV iron.   01/08/2021: Tested positive H. pylori.  Treated with quadruple based bismuth regimen. 02/04/2021: Hemoglobin 13.5 g, ferritin 213, B12 normal, folate normal, CMP normal 01/28/2021: EGD: Features of gastric bypass seen otherwise procedure was normal.  Colonoscopy also performed on the same day.  2 sessile polyps 10 to 12 mm were resected colon there were tubular adenomas repeat colonoscopy in 3 years  03/19/2021: H. pylori breath test negative    Interval history  02/25/2021-10/07/2021    05/29/2021: Hemoglobin 12.1 g.  Albumin 2.5, ALT 30, AST 31 10/03/2021: Ultrasound abdomen showed small amount of ascites bilateral pleural effusions.  08/06/2021 echo shows normal study normal LV function  Hepatic steatosis without focal liver lesions.  Evidence of prior cholecystectomy.  She states that she is feeling weak denies any active alcohol consumption in the past.  At her maximum she weighed 400 pounds.   Denies any specific GI symptoms. Current Outpatient Medications  Medication Sig Dispense Refill   albuterol (VENTOLIN HFA)  108 (90 Base) MCG/ACT inhaler Inhale 2 puffs into the lungs in the morning, at noon, in the evening, and at bedtime.     B Complex Vitamins (VITAMIN B COMPLEX PO) Take 1 tablet by mouth daily.     calcitRIOL (ROCALTROL) 0.5 MCG capsule Take 0.5 mcg by mouth daily.  5   calcitRIOL (ROCALTROL) 0.5 MCG capsule Take 1 tablet by mouth daily.     Cholecalciferol ([employer] D3) 125 MCG (5000 UT) capsule Take 1 tablet by mouth daily.     Cholecalciferol 125 MCG (5000 UT) capsule Take 1 capsule by mouth daily.     denosumab (PROLIA) 60 MG/ML SOSY injection Inject 60 mg into the skin every 6 (six) months.     doxycycline (VIBRA-TABS) 100 MG tablet Take 100 mg by mouth 2 (two) times daily.     famotidine (PEPCID) 20 MG tablet Take 1 tablet by mouth daily.     furosemide (LASIX) 20 MG tablet Take 1 tablet (20 mg total) by mouth 2 (two) times daily. 10 tablet 0   Melatonin 10 MG TABS Take 1 tablet by mouth as needed.     Multiple Vitamin (M.V.I. ADULT IV) Take 1 tablet by mouth daily.     nystatin (MYCOSTATIN) 100000 UNIT/ML suspension Take by mouth.     pantoprazole (PROTONIX) 20 MG tablet Take 1 tablet (20 mg total) by mouth daily. 90 tablet 3   torsemide (DEMADEX) 10 MG tablet Take 10 mg by mouth daily as needed.     Vitamin A 2400 MCG (8000 UT) CAPS Take 1 capsule by mouth daily.  Vitamin E 180 MG (400 UNIT) CAPS Take 1 capsule by mouth daily.     No current facility-administered medications for this visit.    Allergies as of 10/07/2021 - Review Complete 10/07/2021  Allergen Reaction Noted   Zoledronic acid  09/02/2021   Azithromycin Diarrhea 08/15/2014   Lac bovis Diarrhea 08/15/2014   Prednisone Diarrhea 08/15/2014   Sulfa antibiotics Itching 08/15/2014   Tape Dermatitis 02/24/2018    ROS:  General: Negative for anorexia, weight loss, fever, chills, fatigue, weakness. ENT: Negative for hoarseness, difficulty swallowing , nasal congestion. CV: Negative for chest pain, angina, palpitations,  dyspnea on exertion, peripheral edema.  Respiratory: Negative for dyspnea at rest, dyspnea on exertion, cough, sputum, wheezing.  GI: See history of present illness. GU:  Negative for dysuria, hematuria, urinary incontinence, urinary frequency, nocturnal urination.  Endo: Negative for unusual weight change.    Physical Examination:   BP 122/80    Pulse (!) 105    Temp 98.3 F (36.8 C) (Oral)    Wt 175 lb 12.8 oz (79.7 kg)    BMI 31.14 kg/m   General: Well-nourished, well-developed in no acute distress.  Eyes: No icterus. Conjunctivae pink. Extremities: Bilateral significant lymphedema. Neuro: Alert and oriented x 3.  Grossly intact. Skin: Warm and dry, no jaundice.   Psych: Alert and cooperative, normal mood and affect.   Imaging Studies: US Abdomen Complete  Result Date: 10/03/2021 CLINICAL DATA:  Elevated liver function tests. Prior cholecystectomy. EXAM: ABDOMEN ULTRASOUND COMPLETE COMPARISON:  None. FINDINGS: Gallbladder: The gallbladder is surgically absent. Common bile duct: Diameter: 7.7 mm Liver: No focal lesion identified. Diffusely increased echogenicity of the liver parenchyma is noted. Portal vein is patent on color Doppler imaging with normal direction of blood flow towards the liver. IVC: No abnormality visualized. Pancreas: Visualized portion unremarkable. Spleen: Size (4.9 cm) and appearance within normal limits. Right Kidney: Length: 10.1 cm. Echogenicity within normal limits. A 1.0 cm shadowing echogenic focus is seen within the right kidney. Parapelvic renal cysts versus pelvic fullness is seen. Left Kidney: Length: 9.7 cm. Echogenicity within normal limits. Parapelvic renal cyst versus pelvic fullness is seen. Abdominal aorta: No aneurysm visualized (2.6 cm). Other findings: There is a small amount of ascites. Bilateral pleural effusions are noted, right larger than left. IMPRESSION: 1. Evidence of prior cholecystectomy. 2. Hepatic steatosis without focal liver lesions. 3.  Bilateral pleural effusions, right greater than left. 4. Small amount of ascites. Electronically Signed   By: Virgina Norfolk M.D.   On: 10/03/2021 19:42    Assessment and Plan:   [name] is a 71 y.o. y/o female  with a past medical history of Roux-en-Y gastric bypass, H. pylori infection, status posttreatment.  Prior history of nausea vomiting resolved with commencement of pantoprazole .   EGD did not demonstrate any anastomotic ulcers.  Today she is here to see me for her results of the ultrasound that she has had.  I believe it was done for elevated liver function test.  I am looking at her liver function test from 05/29/2021 and AST and ALT were completely normal.  I do see that her serum albumin has been dropping in value since the past 3 years.  No evidence of cirrhosis on ultrasound.  There is small amount of ascites as well as pleural effusions.  Unclear as to the etiology of the hypoalbuminemia.  I cannot see anywhere urine analysis performed to rule out proteinuria.  It is possible that she has nonalcoholic fatty liver  disease.  Plan 1.  Check PT/INR which will help estimate the synthetic function 2.  Urinalysis to look for protein in the urine.  If there is protein in the urine then it would explain the hypoalbuminemia especially the quantity of protein is large 3.  Would appreciate pulmonary input as to the etiology of the pleural effusion.  It is unlikely that ascites is a cause of the pleural effusion as the ascites is small in volume. 4.  I will check the LFTs again which were normal previously but the referral from the physician has been that it has been abnormal.  If abnormal will obtain full autoimmune and viral hepatitis work-up 5.  Check TSH   Dr Jonathon Bellows  MD,MRCP [facility]) Follow up in 3 to 4 weeks

## 2021-10-07 NOTE — Telephone Encounter (Signed)
Called and spoke with patient and got her appoinment with Dr Elsworth Soho changed to 10/15/21 at 11:15am. Nothing further needed at this time

## 2021-10-08 ENCOUNTER — Other Ambulatory Visit: Payer: Self-pay

## 2021-10-08 ENCOUNTER — Ambulatory Visit (HOSPITAL_COMMUNITY): Payer: Medicare Other | Admitting: Physical Therapy

## 2021-10-08 ENCOUNTER — Telehealth: Payer: Self-pay

## 2021-10-08 DIAGNOSIS — I890 Lymphedema, not elsewhere classified: Secondary | ICD-10-CM | POA: Diagnosis not present

## 2021-10-08 LAB — URINALYSIS
Bilirubin, UA: NEGATIVE
Glucose, UA: NEGATIVE
Ketones, UA: NEGATIVE
Nitrite, UA: NEGATIVE
Protein,UA: NEGATIVE
Specific Gravity, UA: 1.017 (ref 1.005–1.030)
Urobilinogen, Ur: 0.2 mg/dL (ref 0.2–1.0)
pH, UA: 5.5 (ref 5.0–7.5)

## 2021-10-08 LAB — COMPREHENSIVE METABOLIC PANEL
ALT: 24 IU/L (ref 0–32)
AST: 40 IU/L (ref 0–40)
Albumin/Globulin Ratio: 1.2 (ref 1.2–2.2)
Albumin: 2.1 g/dL — ABNORMAL LOW (ref 3.8–4.8)
Alkaline Phosphatase: 82 IU/L (ref 44–121)
BUN/Creatinine Ratio: 13 (ref 12–28)
BUN: 7 mg/dL — ABNORMAL LOW (ref 8–27)
Bilirubin Total: 0.5 mg/dL (ref 0.0–1.2)
CO2: 21 mmol/L (ref 20–29)
Calcium: 6.7 mg/dL — CL (ref 8.7–10.3)
Chloride: 109 mmol/L — ABNORMAL HIGH (ref 96–106)
Creatinine, Ser: 0.53 mg/dL — ABNORMAL LOW (ref 0.57–1.00)
Globulin, Total: 1.8 g/dL (ref 1.5–4.5)
Glucose: 78 mg/dL (ref 70–99)
Potassium: 3.9 mmol/L (ref 3.5–5.2)
Sodium: 142 mmol/L (ref 134–144)
Total Protein: 3.9 g/dL — CL (ref 6.0–8.5)
eGFR: 101 mL/min/{1.73_m2} (ref >59)

## 2021-10-08 LAB — PROTIME-INR
INR: 1.1 (ref 0.9–1.2)
Prothrombin Time: 11.6 s (ref 9.1–12.0)

## 2021-10-08 NOTE — Therapy (Signed)
[facility] [address] [place], [place], [address] Phone: [phone]   Fax:  [phone]  Physical Therapy Treatment  Patient Details  Name: [name] MRN: [id number] Date of Birth: July 03, 1951 Referring Provider (PT): Stana Bunting   Encounter Date: 10/08/2021   PT End of Session - 10/08/21 1309     Visit Number 7    Number of Visits 15    Date for PT Re-Evaluation 10/29/21    Authorization Type [employer] medicare    Progress Note Due on Visit 10    PT Start Time 1315    PT Stop Time 1440    PT Time Calculation (min) 85 min    Activity Tolerance Patient tolerated treatment well    Behavior During Therapy [facility] for tasks assessed/performed             Past Medical History:  Diagnosis Date   Asthma    CHF (congestive heart failure) (HCC)    GERD (gastroesophageal reflux disease)    Internal hemorrhoids    Iron deficiency anemia    Osteoporosis    Peptic ulcer    Tubular adenoma of colon    Vertigo     Past Surgical History:  Procedure Laterality Date   BACK SURGERY     BREAST SURGERY     CHOLECYSTECTOMY     COLONOSCOPY WITH PROPOFOL N/A 06/02/2017   Procedure: COLONOSCOPY WITH PROPOFOL;  Surgeon: Jonathon Bellows, MD;  Location: [facility] ENDOSCOPY;  Service: Gastroenterology;  Laterality: N/A;   COLONOSCOPY WITH PROPOFOL N/A 01/28/2021   Procedure: COLONOSCOPY WITH PROPOFOL;  Surgeon: Jonathon Bellows, MD;  Location: [facility] ENDOSCOPY;  Service: Gastroenterology;  Laterality: N/A;   ESOPHAGOGASTRODUODENOSCOPY (EGD) WITH PROPOFOL N/A 07/01/2017   Procedure: ESOPHAGOGASTRODUODENOSCOPY (EGD) WITH PROPOFOL;  Surgeon: Jonathon Bellows, MD;  Location: [facility] ENDOSCOPY;  Service: Gastroenterology;  Laterality: N/A;   ESOPHAGOGASTRODUODENOSCOPY (EGD) WITH PROPOFOL N/A 01/28/2021   Procedure: ESOPHAGOGASTRODUODENOSCOPY (EGD) WITH PROPOFOL;  Surgeon: Jonathon Bellows, MD;  Location: [facility] ENDOSCOPY;  Service: Gastroenterology;  Laterality: N/A;    FLEXIBLE SIGMOIDOSCOPY N/A 07/01/2017   Procedure: FLEXIBLE SIGMOIDOSCOPY;  Surgeon: Jonathon Bellows, MD;  Location: [facility] ENDOSCOPY;  Service: Gastroenterology;  Laterality: N/A;   KNEE SURGERY     mini gastric bypass     SHOULDER SURGERY     TONSILLECTOMY      There were no vitals filed for this visit.   Subjective Assessment - 10/08/21 1440     Subjective Pt states that she is having labs drawn after this treatment as they are trying to find out what is going on with her liver. Removed bandages yesterday.    Pertinent History lymphedema, tumor of the brain , CHF,    How long can you stand comfortably? 5 minuts    How long can you walk comfortably? 5-10 minutes at the most    Currently in Pain? No/denies    Pain Score 0-No pain                   LYMPHEDEMA/ONCOLOGY QUESTIONNAIRE - 10/08/21 0001       Right Lower Extremity Lymphedema   20 cm Proximal to Suprapatella 60.8 cm (P)     10 cm Proximal to Suprapatella 55 cm (P)     At Midpatella/Popliteal Crease 46.7 cm (P)     30 cm Proximal to Floor at Lateral Plantar Foot 45 cm (P)     20 cm Proximal to Floor at Lateral Plantar Foot 43.5 1 (P)  10 cm Proximal to Floor at Lateral Malleoli 31.3 cm (P)     Circumference of ankle/heel 35 cm. (P)     5 cm Proximal to 1st MTP Joint 22.5 cm (P)     Across MTP Joint 22.8 cm (P)       Left Lower Extremity Lymphedema   20 cm Proximal to Suprapatella 59.5 cm (P)     10 cm Proximal to Suprapatella 55.8 cm (P)     At Midpatella/Popliteal Crease 55.8 cm (P)     30 cm Proximal to Floor at Lateral Plantar Foot 47.8 cm (P)     20 cm Proximal to Floor at Lateral Plantar Foot 45 cm (P)     10 cm Proximal to Floor at Lateral Malleoli 32.5 cm (P)     Circumference of ankle/heel 36 cm. (P)     5 cm Proximal to 1st MTP Joint 23 cm (P)                         [facility] Adult PT Treatment/Exercise - 10/08/21 0001       Manual Therapy   Manual Therapy Manual Lymphatic Drainage  (MLD);Compression Bandaging    Manual therapy comments completed seperate from all other aspects    Manual Lymphatic Drainage (MLD) to include supraclavicular, deep and superfical abdominal, inguinal axillary anastomosis followed by B LEboth anterior and posteriorly    Compression Bandaging multilayer short stretch bandages with 1/2" foam from foot to thighB    Other Manual Therapy measure                       PT Short Term Goals - 09/24/21 1600       PT SHORT TERM GOAL #1   Title Pt to be I in self manual techniques.    Time 2    Period Weeks    Status New    Target Date 10/08/21      PT SHORT TERM GOAL #2   Title PT to have decreased 1.5cm in thigh and leg,    Time 2    Period Weeks    Status New               PT Long Term Goals - 09/24/21 1601       PT LONG TERM GOAL #1   Title PT to have ankle foot wrap, LE and thigh juxtafit and be able to Timmothy Sours I    Time 5    Period Weeks    Status New    Target Date 10/29/21      PT LONG TERM GOAL #2   Title PT to have lost between 3 cm from thigh and leg measurements on average,    Time 5    Period Weeks    Status New      PT LONG TERM GOAL #3   Title PT pain to be no greater than a 6/10 in LE    Time 5    Period Weeks    Status New                   Plan - 10/08/21 1437     Clinical Impression Statement Pt has increased induration and increased measurements today.  States she has been doing everything she is supposed to be doing.  States that he liver enzymes are off and they are trying to get to the bottom of that and wonderis if  that has something to do with why she is up.  Pt tolerated treatment well.    Personal Factors and Comorbidities Comorbidity 2;Comorbidity 3+;Fitness;Time since onset of injury/illness/exacerbation    Comorbidities gastric bypass followed by plastic surgery to remove excessive skin as pt lost over 250#, OA,    Examination-Activity Limitations Dressing;Locomotion  Level;Stand    Examination-Participation Restrictions Cleaning;Community Activity;Shop;Meal Prep    Stability/Clinical Decision Making Evolving/Moderate complexity    Rehab Potential Good    PT Frequency 3x / week    PT Duration 6 weeks    PT Treatment/Interventions Manual techniques;Therapeutic activities;Patient/family education;Manual lymph drainage;Therapeutic exercise    PT Next Visit Plan Continue with complete lymphatic therapy.  Measure every Wednesday.    PT Home Exercise Plan ankle pump, LAQ, hip ab/adduction, march, diaphragmic breathing and lymphatic squeeze.; self manual             Patient will benefit from skilled therapeutic intervention in order to improve the following deficits and impairments:  Pain, Difficulty walking, Increased edema  Visit Diagnosis: Lymphedema, not elsewhere classified     Problem List Patient Active Problem List   Diagnosis Date Noted   Bariatric surgery status 10/07/2021   Cellulitis of umbilicus 59/93/5701   Edema 10/07/2021   Essential tremor 10/07/2021   History of adenomatous polyp of colon 10/07/2021   Hypoalbuminemia 10/07/2021   Hypocalcemia 10/07/2021   Insomnia 10/07/2021   Internal hemorrhoids 10/07/2021   Kidney stone 10/07/2021   Low back pain 10/07/2021   Malnutrition ([place]) 10/07/2021   Osteoporosis 10/07/2021   Pathological fracture of vertebra 10/07/2021   Peptic ulcer disease 10/07/2021   Spontaneous ecchymosis 10/07/2021   Unilateral primary osteoarthritis, unspecified knee 05/06/2021   Acute exacerbation of chronic obstructive pulmonary disease (COPD) ([place]) 04/08/2021   History of anemia 04/08/2021   History of asthma 04/08/2021   History of COVID-19 04/08/2021   Shortness of breath 04/08/2021   Acute COVID-19 03/08/2021   Cough 03/08/2021   Fever 03/08/2021   Colon cancer screening 01/08/2021   Arthritis of right hand 11/15/2019   Age-related osteoporosis without current pathological fracture 10/30/2019    Left-sided weakness    Stroke-like symptoms 02/24/2018   HTN (hypertension) 02/24/2018   Primary osteoarthritis of both hands 11/03/2017   Dupuytren's contracture of left hand 11/03/2017   DDD (degenerative disc disease), lumbar 11/03/2017   History of gastric bypass 11/03/2017   Lymphedema 09/17/2017   History of vertebral fracture 09/17/2017   History of osteoporosis 09/17/2017   Iron deficiency anemia due to chronic blood loss 06/28/2017   Lower GI bleed 05/31/2017   Rectal mass 05/31/2017   Asthma 05/31/2017   GERD (gastroesophageal reflux disease) 05/31/2017   Closed fracture of upper end of humerus 09/23/2016   Tendinitis 09/23/2016   Patulous eustachian tube of right ear 11/07/2015   Superior semicircular canal dehiscence, 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 [place], [place], [address] Phone: [phone]   Fax:  [phone]  Name: Demaya Ryals Burns MRN: [id number] Date of Birth: 03-23-1951

## 2021-10-08 NOTE — Addendum Note (Signed)
Addended by: Wayna Chalet on: 10/08/2021 09:33 AM   Modules accepted: Orders

## 2021-10-08 NOTE — Telephone Encounter (Signed)
[name] from [facility] called giving me critical lab results. I then contacted Dr. Vicente Males to notify him. He asked to add a lab (calcium, ionized) to the existing blood.  Called [facility] to add the calcium, ionized lab test and I was told that they would send the request and let us know it it could be added or not. They will fax a form with the information.

## 2021-10-10 ENCOUNTER — Other Ambulatory Visit (HOSPITAL_COMMUNITY): Payer: Self-pay | Admitting: Family Medicine

## 2021-10-10 ENCOUNTER — Ambulatory Visit (HOSPITAL_COMMUNITY): Payer: Medicare Other

## 2021-10-10 ENCOUNTER — Other Ambulatory Visit: Payer: Self-pay

## 2021-10-10 ENCOUNTER — Encounter (HOSPITAL_COMMUNITY): Payer: Self-pay

## 2021-10-10 ENCOUNTER — Telehealth: Payer: Self-pay | Admitting: Pulmonary Disease

## 2021-10-10 ENCOUNTER — Telehealth: Payer: Self-pay

## 2021-10-10 DIAGNOSIS — J90 Pleural effusion, not elsewhere classified: Secondary | ICD-10-CM

## 2021-10-10 DIAGNOSIS — R262 Difficulty in walking, not elsewhere classified: Secondary | ICD-10-CM

## 2021-10-10 DIAGNOSIS — I890 Lymphedema, not elsewhere classified: Secondary | ICD-10-CM | POA: Diagnosis not present

## 2021-10-10 DIAGNOSIS — N2889 Other specified disorders of kidney and ureter: Secondary | ICD-10-CM

## 2021-10-10 DIAGNOSIS — E58 Dietary calcium deficiency: Secondary | ICD-10-CM

## 2021-10-10 LAB — TSH: TSH: 8.93 u[IU]/mL — ABNORMAL HIGH (ref 0.450–4.500)

## 2021-10-10 LAB — CALCIUM, IONIZED: Calcium, Ion: 4.1 mg/dL — ABNORMAL LOW (ref 4.5–5.6)

## 2021-10-10 NOTE — Telephone Encounter (Signed)
Called patient to let he know the below information and she agreed with recommendation. I also told patient that she is needing a repeat blood work drawn in two weeks to check her calcium level. Patient agreed as well. Patient had no further questions.

## 2021-10-10 NOTE — Telephone Encounter (Signed)
Not sure we have an explanation for her pleural effusions -could be cardiac or hepatic LVEF was borderline 45 to 50% with grade 1 diastolic dysfunction. Difficult to estimate due to lymphedema She does see cardiology and I gave her Lasix on her initial visit with me. Will forward to Dr. Harl Bowie from cardiology  [place], can you have her obtain another chest x-ray PA and lateral, if effusions are increased we can consider thoracentesis

## 2021-10-10 NOTE — Progress Notes (Signed)
Inform patient   1. Calcium low - commence on oral OTC calcium and vitamin D supplements 1 tab daily -recheck in 2 weeks   2. TSH high check freret 4 and free t3

## 2021-10-10 NOTE — Therapy (Signed)
[facility] [address] [place], [place], [address] Phone: [phone]   Fax:  [phone]  Physical Therapy Treatment  Patient Details  Name: [name] MRN: [id number] Date of Birth: 07/05/1951 Referring Provider (PT): Stana Bunting   Encounter Date: 10/10/2021   PT End of Session - 10/10/21 1441     Visit Number 8    Number of Visits 15    Date for PT Re-Evaluation 10/29/21    Authorization Type UHC medicare    Progress Note Due on Visit 10    PT Start Time 1315    PT Stop Time 1438    PT Time Calculation (min) 83 min    Activity Tolerance Patient tolerated treatment well    Behavior During Therapy [facility] for tasks assessed/performed             Past Medical History:  Diagnosis Date   Asthma    CHF (congestive heart failure) (HCC)    GERD (gastroesophageal reflux disease)    Internal hemorrhoids    Iron deficiency anemia    Osteoporosis    Peptic ulcer    Tubular adenoma of colon    Vertigo     Past Surgical History:  Procedure Laterality Date   BACK SURGERY     BREAST SURGERY     CHOLECYSTECTOMY     COLONOSCOPY WITH PROPOFOL N/A 06/02/2017   Procedure: COLONOSCOPY WITH PROPOFOL;  Surgeon: Jonathon Bellows, MD;  Location: [facility] ENDOSCOPY;  Service: Gastroenterology;  Laterality: N/A;   COLONOSCOPY WITH PROPOFOL N/A 01/28/2021   Procedure: COLONOSCOPY WITH PROPOFOL;  Surgeon: Jonathon Bellows, MD;  Location: [facility] ENDOSCOPY;  Service: Gastroenterology;  Laterality: N/A;   ESOPHAGOGASTRODUODENOSCOPY (EGD) WITH PROPOFOL N/A 07/01/2017   Procedure: ESOPHAGOGASTRODUODENOSCOPY (EGD) WITH PROPOFOL;  Surgeon: Jonathon Bellows, MD;  Location: [facility] ENDOSCOPY;  Service: Gastroenterology;  Laterality: N/A;   ESOPHAGOGASTRODUODENOSCOPY (EGD) WITH PROPOFOL N/A 01/28/2021   Procedure: ESOPHAGOGASTRODUODENOSCOPY (EGD) WITH PROPOFOL;  Surgeon: Jonathon Bellows, MD;  Location: [facility] ENDOSCOPY;  Service: Gastroenterology;  Laterality: N/A;    FLEXIBLE SIGMOIDOSCOPY N/A 07/01/2017   Procedure: FLEXIBLE SIGMOIDOSCOPY;  Surgeon: Jonathon Bellows, MD;  Location: [facility] ENDOSCOPY;  Service: Gastroenterology;  Laterality: N/A;   KNEE SURGERY     mini gastric bypass     SHOULDER SURGERY     TONSILLECTOMY      There were no vitals filed for this visit.   Subjective Assessment - 10/10/21 1319     Subjective Reports they found a 1cm tumor on kidneys and liver is inflammed, swollen abdominal region.  Reoprts some discomfort in stomach today.  Have plans for an abdominal CT scan next Friday.  Received call during session concerning collapsed Rt lung and need for a xray next week.  Reports she feels very weak today and had difficulty getting Lt LE into van last session, someone in parking lot had to assist.    Pertinent History lymphedema, tumor of the brain , CHF,    Patient Stated Goals to get her swelling down    Currently in Pain? Yes    Pain Score 5     Pain Location Abdomen    Pain Orientation Lower;Right    Pain Descriptors / Indicators Discomfort                               [facility] Adult PT Treatment/Exercise - 10/10/21 0001       Manual Therapy   Manual  Therapy Manual Lymphatic Drainage (MLD);Compression Bandaging    Manual therapy comments completed seperate from all other aspects    Manual Lymphatic Drainage (MLD) to include supraclavicular, deep and superfical abdominal, inguinal axillary anastomosis followed by B LEboth anterior and posteriorly    Compression Bandaging multilayer short stretch bandages with 1/2" foam from foot to thighB                       PT Short Term Goals - 09/24/21 1600       PT SHORT TERM GOAL #1   Title Pt to be I in self manual techniques.    Time 2    Period Weeks    Status New    Target Date 10/08/21      PT SHORT TERM GOAL #2   Title PT to have decreased 1.5cm in thigh and leg,    Time 2    Period Weeks    Status New               PT Long Term  Goals - 09/24/21 1601       PT LONG TERM GOAL #1   Title PT to have ankle foot wrap, LE and thigh juxtafit and be able to Timmothy Sours I    Time 5    Period Weeks    Status New    Target Date 10/29/21      PT LONG TERM GOAL #2   Title PT to have lost between 3 cm from thigh and leg measurements on average,    Time 5    Period Weeks    Status New      PT LONG TERM GOAL #3   Title PT pain to be no greater than a 6/10 in LE    Time 5    Period Weeks    Status New                   Plan - 10/10/21 1442     Clinical Impression Statement Induration present lower Rt abdominal region and Bil LE.  Manual lymphedema decongestive technqiues with gentle abdominal work this session due to reports of discomfort.  Application of multilayer short stretch bandages with 1/2in foam with reports of comfort this session.  Noted edema present great toe, added toe wrap.  EOS pt required assistance getting Lt LE into car.  Pt stated she feels exhausted and increased difficulty breathing.  Reports compliance with HEP and pump.  Stated she plans to wash wraps this weekend.    Personal Factors and Comorbidities Comorbidity 2;Comorbidity 3+;Fitness;Time since onset of injury/illness/exacerbation    Comorbidities gastric bypass followed by plastic surgery to remove excessive skin as pt lost over 250#, OA,    Examination-Activity Limitations Dressing;Locomotion Level;Stand    Examination-Participation Restrictions Cleaning;Community Activity;Shop;Meal Prep    Stability/Clinical Decision Making Evolving/Moderate complexity    Clinical Decision Making Moderate    Rehab Potential Good    PT Frequency 3x / week    PT Duration 6 weeks    PT Treatment/Interventions Manual techniques;Therapeutic activities;Patient/family education;Manual lymph drainage;Therapeutic exercise    PT Next Visit Plan Next session wrap only as has earlier apt scheduled on Monday following.  Continue with complete lymphatic therapy.   Measure every Wednesday.    PT Home Exercise Plan ankle pump, LAQ, hip ab/adduction, march, diaphragmic breathing and lymphatic squeeze.; self manual    Consulted and Agree with Plan of Care Patient  Patient will benefit from skilled therapeutic intervention in order to improve the following deficits and impairments:  Pain, Difficulty walking, Increased edema  Visit Diagnosis: Lymphedema, not elsewhere classified  Difficulty in walking, not elsewhere classified     Problem List Patient Active Problem List   Diagnosis Date Noted   Bariatric surgery status 10/07/2021   Cellulitis of umbilicus 16/06/9603   Edema 10/07/2021   Essential tremor 10/07/2021   History of adenomatous polyp of colon 10/07/2021   Hypoalbuminemia 10/07/2021   Hypocalcemia 10/07/2021   Insomnia 10/07/2021   Internal hemorrhoids 10/07/2021   Kidney stone 10/07/2021   Low back pain 10/07/2021   Malnutrition ([place]) 10/07/2021   Osteoporosis 10/07/2021   Pathological fracture of vertebra 10/07/2021   Peptic ulcer disease 10/07/2021   Spontaneous ecchymosis 10/07/2021   Unilateral primary osteoarthritis, unspecified knee 05/06/2021   Acute exacerbation of chronic obstructive pulmonary disease (COPD) ([place]) 04/08/2021   History of anemia 04/08/2021   History of asthma 04/08/2021   History of COVID-19 04/08/2021   Shortness of breath 04/08/2021   Acute COVID-19 03/08/2021   Cough 03/08/2021   Fever 03/08/2021   Colon cancer screening 01/08/2021   Arthritis of right hand 11/15/2019   Age-related osteoporosis without current pathological fracture 10/30/2019   Left-sided weakness    Stroke-like symptoms 02/24/2018   HTN (hypertension) 02/24/2018   Primary osteoarthritis of both hands 11/03/2017   Dupuytren's contracture of left hand 11/03/2017   DDD (degenerative disc disease), lumbar 11/03/2017   History of gastric bypass 11/03/2017   Lymphedema 09/17/2017   History of vertebral fracture  09/17/2017   History of osteoporosis 09/17/2017   Iron deficiency anemia due to chronic blood loss 06/28/2017   Lower GI bleed 05/31/2017   Rectal mass 05/31/2017   Asthma 05/31/2017   GERD (gastroesophageal reflux disease) 05/31/2017   Closed fracture of upper end of humerus 09/23/2016   Tendinitis 09/23/2016   Patulous eustachian tube of right ear 11/07/2015   Superior semicircular canal dehiscence, 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

## 2021-10-10 NOTE — Telephone Encounter (Signed)
Called and spoke to patient. She is planning to get CXR done the day of appt. At [facility] In the am. Order placed and nothing further needed.

## 2021-10-10 NOTE — Telephone Encounter (Signed)
Called and spoke to patient. She is okay with CXR PA and lateral. She wants to know if Dr. Elsworth Soho would be okay with her putting this off until mid-late next week due to a busy schedule or if she can get it done during her appt. Has an appt with Dr.Alva on 10/15/2021 in [place] office   Dr. Elsworth Soho please advise

## 2021-10-10 NOTE — Telephone Encounter (Signed)
-----   Message from Jonathon Bellows, MD sent at 10/07/2021  2:37 PM EST ----- Good afternoon Dr. Elsworth Soho  Patient was referred to me for small amount of ascites seen on the ultrasound.  She also has a bit of pleural effusion bilateral.  Her LFTs are normal except a very low albumin which has been dropping for the past 3 years.  I have just ordered a PT/INR to determine her liver function.  No known prior liver disease.  Last time I did an EGD there were no esophageal varices.  Unclear as to the etiology of her hypoalbuminemia and ascites which is minimal.  It could be that the ascites is secondary to the hypoalbuminemia.  I was wondering if you could get your thoughts on the pleural effusion.  If you feel there is a pulmonary or cardiac process or a protein-losing enteropathy contributing to the same that would explain the effusions otherwise may need to perform a liver biopsy can obtain portal pressures.  It is possible that she has Karlene Lineman cirrhosis as she was 400 pounds Once upon a time.  I am checking her urine to rule out proteinuria. regards Kiran Dr Jonathon Bellows MD,MRCP [facility])

## 2021-10-10 NOTE — Telephone Encounter (Signed)
-----   Message from Jonathon Bellows, MD sent at 10/10/2021 10:08 AM EST ----- Inform patient   1. Calcium low - commence on oral OTC calcium and vitamin D supplements 1 tab daily -recheck in 2 weeks   2. TSH high check freret 4 and free t3

## 2021-10-13 ENCOUNTER — Encounter: Payer: Self-pay | Admitting: Cardiology

## 2021-10-13 ENCOUNTER — Ambulatory Visit (HOSPITAL_COMMUNITY)
Admission: RE | Admit: 2021-10-13 | Discharge: 2021-10-13 | Disposition: A | Discharge status: Home / Self Care | Payer: Medicare Other | Source: Ambulatory Visit | Attending: Pulmonary Disease | Admitting: Pulmonary Disease

## 2021-10-13 ENCOUNTER — Ambulatory Visit (HOSPITAL_COMMUNITY): Payer: Medicare Other | Admitting: Physical Therapy

## 2021-10-13 ENCOUNTER — Ambulatory Visit (INDEPENDENT_AMBULATORY_CARE_PROVIDER_SITE_OTHER): Payer: Medicare Other | Admitting: Cardiology

## 2021-10-13 ENCOUNTER — Other Ambulatory Visit: Payer: Self-pay

## 2021-10-13 VITALS — BP 118/80 | HR 78 | Ht 64.0 in | Wt 177.6 lb

## 2021-10-13 DIAGNOSIS — R262 Difficulty in walking, not elsewhere classified: Secondary | ICD-10-CM

## 2021-10-13 DIAGNOSIS — R079 Chest pain, unspecified: Secondary | ICD-10-CM | POA: Diagnosis not present

## 2021-10-13 DIAGNOSIS — J90 Pleural effusion, not elsewhere classified: Secondary | ICD-10-CM | POA: Insufficient documentation

## 2021-10-13 DIAGNOSIS — R0602 Shortness of breath: Secondary | ICD-10-CM

## 2021-10-13 DIAGNOSIS — I890 Lymphedema, not elsewhere classified: Secondary | ICD-10-CM

## 2021-10-13 DIAGNOSIS — I471 Supraventricular tachycardia: Secondary | ICD-10-CM | POA: Diagnosis not present

## 2021-10-13 DIAGNOSIS — R002 Palpitations: Secondary | ICD-10-CM | POA: Diagnosis not present

## 2021-10-13 MED ORDER — DILTIAZEM HCL 30 MG PO TABS
30.0000 mg | ORAL_TABLET | Freq: Two times a day (BID) | ORAL | 2 refills | Status: DC
Start: 2021-10-13 — End: 2022-01-06

## 2021-10-13 NOTE — Patient Instructions (Addendum)
Medication Instructions:  Your physician has recommended you make the following change in your medication:  Start diltiazem 30 mg twice daily Continue other medications the same  Labwork: none  Testing/Procedures: none  Follow-Up: Your physician recommends that you schedule a follow-up appointment in: 2 months  Any Other Special Instructions Will Be Listed Below (If Applicable).  If you need a refill on your cardiac medications before your next appointment, please call your pharmacy.

## 2021-10-13 NOTE — Therapy (Signed)
[name], [place], [address] Phone: [phone]   Fax:  [phone]  Physical Therapy Treatment  Patient Details  Name: [name] MRN: [id number] Date of Birth: December 01, 1950 Referring Provider (PT): Stana Bunting   Encounter Date: 10/13/2021   PT End of Session - 10/13/21 0955     Visit Number 9    Number of Visits 15    Date for PT Re-Evaluation 10/29/21    Authorization Type UHC medicare    Progress Note Due on Visit 10    PT Start Time 0830    PT Stop Time 0925    PT Time Calculation (min) 55 min    Activity Tolerance Patient tolerated treatment well    Behavior During Therapy [facility] for tasks assessed/performed             Past Medical History:  Diagnosis Date   Asthma    CHF (congestive heart failure) (HCC)    GERD (gastroesophageal reflux disease)    Internal hemorrhoids    Iron deficiency anemia    Osteoporosis    Peptic ulcer    Tubular adenoma of colon    Vertigo     Past Surgical History:  Procedure Laterality Date   BACK SURGERY     BREAST SURGERY     CHOLECYSTECTOMY     COLONOSCOPY WITH PROPOFOL N/A 06/02/2017   Procedure: COLONOSCOPY WITH PROPOFOL;  Surgeon: Jonathon Bellows, MD;  Location: [facility] ENDOSCOPY;  Service: Gastroenterology;  Laterality: N/A;   COLONOSCOPY WITH PROPOFOL N/A 01/28/2021   Procedure: COLONOSCOPY WITH PROPOFOL;  Surgeon: Jonathon Bellows, MD;  Location: [facility] ENDOSCOPY;  Service: Gastroenterology;  Laterality: N/A;   ESOPHAGOGASTRODUODENOSCOPY (EGD) WITH PROPOFOL N/A 07/01/2017   Procedure: ESOPHAGOGASTRODUODENOSCOPY (EGD) WITH PROPOFOL;  Surgeon: Jonathon Bellows, MD;  Location: [facility] ENDOSCOPY;  Service: Gastroenterology;  Laterality: N/A;   ESOPHAGOGASTRODUODENOSCOPY (EGD) WITH PROPOFOL N/A 01/28/2021   Procedure: ESOPHAGOGASTRODUODENOSCOPY (EGD) WITH PROPOFOL;  Surgeon: Jonathon Bellows, MD;  Location: [facility] ENDOSCOPY;  Service: Gastroenterology;  Laterality: N/A;    FLEXIBLE SIGMOIDOSCOPY N/A 07/01/2017   Procedure: FLEXIBLE SIGMOIDOSCOPY;  Surgeon: Jonathon Bellows, MD;  Location: [facility] ENDOSCOPY;  Service: Gastroenterology;  Laterality: N/A;   KNEE SURGERY     mini gastric bypass     SHOULDER SURGERY     TONSILLECTOMY      There were no vitals filed for this visit.   Subjective Assessment - 10/13/21 0953     Subjective pt states she has another appt in [place] at 10 and will only have time for bandaging today.  States she would like to get an appt wednesday evening after 2 instead of thursday morning since shes coming back again Friday morning.   States she is still feeling weak and having more edema in her abdomen and Rt breast.  States the top bandage usually ends up falling down and her thigh will swell.    Currently in Pain? No/denies                               [facility] Adult PT Treatment/Exercise - 10/13/21 0001       Manual Therapy   Manual Therapy Compression Bandaging    Manual therapy comments completed seperate from all other aspects    Compression Bandaging multilayer short stretch bandages with 1/2" foam from foot to thighB  PT Short Term Goals - 09/24/21 1600       PT SHORT TERM GOAL #1   Title Pt to be I in self manual techniques.    Time 2    Period Weeks    Status New    Target Date 10/08/21      PT SHORT TERM GOAL #2   Title PT to have decreased 1.5cm in thigh and leg,    Time 2    Period Weeks    Status New               PT Long Term Goals - 09/24/21 1601       PT LONG TERM GOAL #1   Title PT to have ankle foot wrap, LE and thigh juxtafit and be able to Timmothy Sours I    Time 5    Period Weeks    Status New    Target Date 10/29/21      PT LONG TERM GOAL #2   Title PT to have lost between 3 cm from thigh and leg measurements on average,    Time 5    Period Weeks    Status New      PT LONG TERM GOAL #3   Title PT pain to be no greater than a 6/10 in LE     Time 5    Period Weeks    Status New                   Plan - 10/13/21 8032     Clinical Impression Statement pt with only time for bandaging today due to conflicting appt.  Moisturized LE's well and completed compression bandaging including toe wraps to full LE's.  pt reported overall comfort with banaging.  Pt will need a 10th visit PN and measurements next visit.    Personal Factors and Comorbidities Comorbidity 2;Comorbidity 3+;Fitness;Time since onset of injury/illness/exacerbation    Comorbidities gastric bypass followed by plastic surgery to remove excessive skin as pt lost over 250#, OA,    Examination-Activity Limitations Dressing;Locomotion Level;Stand    Examination-Participation Restrictions Cleaning;Community Activity;Shop;Meal Prep    Stability/Clinical Decision Making Evolving/Moderate complexity    Rehab Potential Good    PT Frequency 3x / week    PT Duration 6 weeks    PT Treatment/Interventions Manual techniques;Therapeutic activities;Patient/family education;Manual lymph drainage;Therapeutic exercise    PT Next Visit Plan Next session complete 10th visit PN.  Continue with complete lymphatic therapy.  Measure every Wednesday.  Try to get afternoon appt on Wednesday rather than thursday morning if possible.    PT Home Exercise Plan ankle pump, LAQ, hip ab/adduction, march, diaphragmic breathing and lymphatic squeeze.; self manual    Consulted and Agree with Plan of Care Patient             Patient will benefit from skilled therapeutic intervention in order to improve the following deficits and impairments:  Pain, Difficulty walking, Increased edema  Visit Diagnosis: Lymphedema, not elsewhere classified  Difficulty in walking, not elsewhere classified     Problem List Patient Active Problem List   Diagnosis Date Noted   Bariatric surgery status 10/07/2021   Cellulitis of umbilicus 09/06/8249   Edema 10/07/2021   Essential tremor 10/07/2021    History of adenomatous polyp of colon 10/07/2021   Hypoalbuminemia 10/07/2021   Hypocalcemia 10/07/2021   Insomnia 10/07/2021   Internal hemorrhoids 10/07/2021   Kidney stone 10/07/2021   Low back pain 10/07/2021   Malnutrition ([place]) 10/07/2021  Osteoporosis 10/07/2021   Pathological fracture of vertebra 10/07/2021   Peptic ulcer disease 10/07/2021   Spontaneous ecchymosis 10/07/2021   Unilateral primary osteoarthritis, unspecified knee 05/06/2021   Acute exacerbation of chronic obstructive pulmonary disease (COPD) ([place]) 04/08/2021   History of anemia 04/08/2021   History of asthma 04/08/2021   History of COVID-19 04/08/2021   Shortness of breath 04/08/2021   Acute COVID-19 03/08/2021   Cough 03/08/2021   Fever 03/08/2021   Colon cancer screening 01/08/2021   Arthritis of right hand 11/15/2019   Age-related osteoporosis without current pathological fracture 10/30/2019   Left-sided weakness    Stroke-like symptoms 02/24/2018   HTN (hypertension) 02/24/2018   Primary osteoarthritis of both hands 11/03/2017   Dupuytren's contracture of left hand 11/03/2017   DDD (degenerative disc disease), lumbar 11/03/2017   History of gastric bypass 11/03/2017   Lymphedema 09/17/2017   History of vertebral fracture 09/17/2017   History of osteoporosis 09/17/2017   Iron deficiency anemia due to chronic blood loss 06/28/2017   Lower GI bleed 05/31/2017   Rectal mass 05/31/2017   Asthma 05/31/2017   GERD (gastroesophageal reflux disease) 05/31/2017   Closed fracture of upper end of humerus 09/23/2016   Tendinitis 09/23/2016   Patulous eustachian tube of right ear 11/07/2015   Superior semicircular canal dehiscence, 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

## 2021-10-13 NOTE — Progress Notes (Signed)
Clinical Summary [name] is a 71 y.o.female former patient of Dr Bronson Ing, this is our first visit together. Seen today for follow up of the following medical problems.  1.Palpitations - prior monitors have shown PACs and atrial runs -08/2021 monitor occasional PACs, frequent runs of SVT up to 29 seconds. Two runs of NSVT longest 8 beats. Symptoms correalted with PACs and SVT.   - ongoing palpitations, varies in frequency but occurs most days and can be several times - from 2019 note was started on lopressor but she stopped on her own, no reason given.   2. Chest pain - 07/2021 nuclear stress: no ischemia - chest pains have resolved  3. Chronic lymphedema - getting regular compression wraps  4. SOB - ongoign eval by pumlonary - benign stress test - [facility] echo low normal to mildly decreased LVEF 24-09%, grade I diastolic dysfunction. She is on oral lasix. Volme status difficult to assess due to body habitus.  - could be related to her PACs and PSVT   5. Pleural effusion - noted 08/2021 xray by pulmonary    Past Medical History:  Diagnosis Date   Asthma    CHF (congestive heart failure) (HCC)    GERD (gastroesophageal reflux disease)    Internal hemorrhoids    Iron deficiency anemia    Osteoporosis    Peptic ulcer    Tubular adenoma of colon    Vertigo      Allergies  Allergen Reactions   Zoledronic Acid     Other reaction(s): fever and flu-like symptoms   Azithromycin Diarrhea    Other reaction(s): diarrhea   Lac Bovis Diarrhea   Prednisone Diarrhea   Sulfa Antibiotics Itching   Tape Dermatitis     Current Outpatient Medications  Medication Sig Dispense Refill   albuterol (VENTOLIN HFA) 108 (90 Base) MCG/ACT inhaler Inhale 2 puffs into the lungs in the morning, at noon, in the evening, and at bedtime.     B Complex Vitamins (VITAMIN B COMPLEX PO) Take 1 tablet by mouth daily.     calcitRIOL (ROCALTROL) 0.5 MCG capsule Take 0.5 mcg by mouth daily.   5   calcitRIOL (ROCALTROL) 0.5 MCG capsule Take 1 tablet by mouth daily.     Cholecalciferol (CVS D3) 125 MCG (5000 UT) capsule Take 1 tablet by mouth daily.     Cholecalciferol 125 MCG (5000 UT) capsule Take 1 capsule by mouth daily.     denosumab (PROLIA) 60 MG/ML SOSY injection Inject 60 mg into the skin every 6 (six) months.     doxycycline (VIBRA-TABS) 100 MG tablet Take 100 mg by mouth 2 (two) times daily.     famotidine (PEPCID) 20 MG tablet Take 1 tablet by mouth daily.     furosemide (LASIX) 20 MG tablet Take 1 tablet (20 mg total) by mouth 2 (two) times daily. 10 tablet 0   Melatonin 10 MG TABS Take 1 tablet by mouth as needed.     Multiple Vitamin (M.V.I. ADULT IV) Take 1 tablet by mouth daily.     nystatin (MYCOSTATIN) 100000 UNIT/ML suspension Take by mouth.     pantoprazole (PROTONIX) 20 MG tablet Take 1 tablet (20 mg total) by mouth daily. 90 tablet 3   torsemide (DEMADEX) 10 MG tablet Take 10 mg by mouth daily as needed.     Vitamin A 2400 MCG (8000 UT) CAPS Take 1 capsule by mouth daily.     Vitamin E 180 MG (400 UNIT) CAPS Take  1 capsule by mouth daily.     No current facility-administered medications for this visit.     Past Surgical History:  Procedure Laterality Date   BACK SURGERY     BREAST SURGERY     CHOLECYSTECTOMY     COLONOSCOPY WITH PROPOFOL N/A 06/02/2017   Procedure: COLONOSCOPY WITH PROPOFOL;  Surgeon: Jonathon Bellows, MD;  Location: [facility] ENDOSCOPY;  Service: Gastroenterology;  Laterality: N/A;   COLONOSCOPY WITH PROPOFOL N/A 01/28/2021   Procedure: COLONOSCOPY WITH PROPOFOL;  Surgeon: Jonathon Bellows, MD;  Location: [facility] ENDOSCOPY;  Service: Gastroenterology;  Laterality: N/A;   ESOPHAGOGASTRODUODENOSCOPY (EGD) WITH PROPOFOL N/A 07/01/2017   Procedure: ESOPHAGOGASTRODUODENOSCOPY (EGD) WITH PROPOFOL;  Surgeon: Jonathon Bellows, MD;  Location: [facility] ENDOSCOPY;  Service: Gastroenterology;  Laterality: N/A;   ESOPHAGOGASTRODUODENOSCOPY (EGD) WITH PROPOFOL N/A 01/28/2021    Procedure: ESOPHAGOGASTRODUODENOSCOPY (EGD) WITH PROPOFOL;  Surgeon: Jonathon Bellows, MD;  Location: [facility] ENDOSCOPY;  Service: Gastroenterology;  Laterality: N/A;   FLEXIBLE SIGMOIDOSCOPY N/A 07/01/2017   Procedure: FLEXIBLE SIGMOIDOSCOPY;  Surgeon: Jonathon Bellows, MD;  Location: [facility] ENDOSCOPY;  Service: Gastroenterology;  Laterality: N/A;   KNEE SURGERY     mini gastric bypass     SHOULDER SURGERY     TONSILLECTOMY       Allergies  Allergen Reactions   Zoledronic Acid     Other reaction(s): fever and flu-like symptoms   Azithromycin Diarrhea    Other reaction(s): diarrhea   Lac Bovis Diarrhea   Prednisone Diarrhea   Sulfa Antibiotics Itching   Tape Dermatitis      Family History  Problem Relation Age of Onset   Hypertension Other    Stroke Other    Diabetes Other    Heart attack Other    Obesity Other    Diabetes Father    Heart disease Father    Colon cancer Father    Breast cancer Paternal Grandmother    Healthy Daughter    Stomach cancer Neg Hx    Pancreatic cancer Neg Hx      Social History [name] reports that she has never smoked. She has never used smokeless tobacco. [name] reports no history of alcohol use.   Review of Systems CONSTITUTIONAL: No weight loss, fever, chills, weakness or fatigue.  HEENT: Eyes: No visual loss, blurred vision, double vision or yellow sclerae.No hearing loss, sneezing, congestion, runny nose or sore throat.  SKIN: No rash or itching.  CARDIOVASCULAR: per hpi RESPIRATORY: per hpi GASTROINTESTINAL: No anorexia, nausea, vomiting or diarrhea. No abdominal pain or blood.  GENITOURINARY: No burning on urination, no polyuria NEUROLOGICAL: No headache, dizziness, syncope, paralysis, ataxia, numbness or tingling in the extremities. No change in bowel or bladder control.  MUSCULOSKELETAL: No muscle, back pain, joint pain or stiffness.  LYMPHATICS: No enlarged nodes. No history of splenectomy.  PSYCHIATRIC: No history of depression  or anxiety.  ENDOCRINOLOGIC: No reports of sweating, cold or heat intolerance. No polyuria or polydipsia.  Marland Kitchen   Physical Examination Today's Vitals   10/13/21 0948  BP: 118/80  Pulse: 78  SpO2: 95%  Weight: 177 lb 9.6 oz (80.6 kg)  Height: 5\' 4"  (1.626 m)   Body mass index is 30.48 kg/m.  Gen: resting comfortably, no acute distress HEENT: no scleral icterus, pupils equal round and reactive, no palptable cervical adenopathy,  CV: RRR, no m/r/g no jvd Resp: decreased BS right base GI: abdomen is soft, non-tender, non-distended, normal bowel sounds, no hepatosplenomegaly MSK: extremities are warm, no edema.  Skin: warm, no rash Neuro:  no focal deficits Psych: appropriate affect   Diagnostic Studies 08/2021 heart monitor 10 day monitor Occasional supraventricular ectopy in the form of isolated PACs, rare couplets and triplets. Frequent runs of SVT longest 29.2 seconds. Rare ventricular ectopy in the form of isolated PVCs, couplets. 2 runs of NSVT longest 8 beats. Reported symptoms correlated with sinus rhythm with PACs and short runs of SVT.   07/2021 nuclear stress   The study is normal. The study is low risk.   No ST deviation was noted.   LV perfusion is normal.   Left ventricular function is normal. End diastolic cavity size is normal.   07/2021 [facility] echo LVEF 45-50%, grade I dd, mild MR, mild AI    Assessment and Plan  1.Palpitations/PACs/PSVT - noted on recent monitor - tried on lopressor in the past, prior notes mention she stopped it on her own but unclear reasoning, she does not recall - try starting diltiazem 30mg  bid and monitor symptoms.   2.Chest pain - resovled, recent negative stress test - no further workup  3. SOB - ongoing eval by pulmonary - cardiac workup with normal stress strest. Echo low normal to mildly decreased LVEF, grade I diastolic dysfunction Fairly minor findigns, she is on oral lasix, volume status difficult to assess  due to body habitus - SOB could be related to PACs/PSVT, follow on diltiazem - if ongoign symptoms at f/u could consider trial of higher diuretic dosing, adding bnp to next round of labs.     Arnoldo Lenis, M.D.

## 2021-10-14 ENCOUNTER — Ambulatory Visit (INDEPENDENT_AMBULATORY_CARE_PROVIDER_SITE_OTHER): Payer: Medicare Other | Admitting: Neurology

## 2021-10-14 ENCOUNTER — Encounter: Payer: Self-pay | Admitting: Neurology

## 2021-10-14 ENCOUNTER — Telehealth: Payer: Self-pay

## 2021-10-14 ENCOUNTER — Ambulatory Visit: Payer: Medicare Other | Admitting: Pulmonary Disease

## 2021-10-14 ENCOUNTER — Other Ambulatory Visit: Payer: Self-pay

## 2021-10-14 ENCOUNTER — Encounter (HOSPITAL_COMMUNITY): Payer: Medicare Other

## 2021-10-14 VITALS — BP 101/73 | HR 91 | Ht 64.0 in | Wt 166.0 lb

## 2021-10-14 DIAGNOSIS — D329 Benign neoplasm of meninges, unspecified: Secondary | ICD-10-CM | POA: Diagnosis not present

## 2021-10-14 DIAGNOSIS — R42 Dizziness and giddiness: Secondary | ICD-10-CM | POA: Diagnosis not present

## 2021-10-14 DIAGNOSIS — J90 Pleural effusion, not elsewhere classified: Secondary | ICD-10-CM

## 2021-10-14 DIAGNOSIS — H838X2 Other specified diseases of left inner ear: Secondary | ICD-10-CM | POA: Diagnosis not present

## 2021-10-14 NOTE — Telephone Encounter (Signed)
Called patient to let her know about her lab results and she stated that she was aware and that today she went to see her endocrinologist and they will recheck her blood by the end of next month (February) and if it's still low, then he will give medication if needed to. Patient also wanted me to inform Dr. Vicente Males that she is having a CT done on Friday 10/17/2021 at [facility]. She was going to tell them to send Dr. Vicente Males a copy.

## 2021-10-14 NOTE — Telephone Encounter (Signed)
-----   Message from Jonathon Bellows, MD sent at 10/12/2021  4:56 PM EST ----- Inform free t3 islow and tsh high- needs to see her PCP to get evaluated- please ensure we send these results to her PCP and confirm receipt

## 2021-10-14 NOTE — Progress Notes (Signed)
[facility]  PATIENT: [name] DOB: [birth year]  REQUESTING CLINICIAN: Bonnita Hollow, MD HISTORY FROM: Patient  REASON FOR VISIT: Abnormal MRI Results    HISTORICAL  CHIEF COMPLAINT:  Chief Complaint  Patient presents with   Follow-up    Rm 71, with husband  Here to discuss MRI results, c/o dizziness, use walker     HISTORY OF PRESENT ILLNESS:  This is a 71 year old woman past medical history of obesity status post gastric bypass, acquired lymphedema, dizziness and dyspnea who is presenting after an abnormal MRI.  Patient reports history of dizziness, intermittent for the past year and during this work-up, she had a MRI brain which showed a 6 mm parafalcine meningioma, and she was referred to neurology for further work-up.  In terms of the dizziness she reported previous history of dizziness, was diagnosed with a semicircular canal dehiscence which required surgery.  After the surgery she was doing well and until about a year ago when she is having intermittent dizziness.  She did follow-up with the ENT who ordered the MRI and ENT was planning to order a temporal bone CT scan if the MRI is unrevealing.  Overall she feels like her health is declining, she has kidney cyst, liver issue and now she reports fluid in the abdomen and lungs and worsening left hearing loss.  She also has acquired lymphedema in the lower extremity that limits her mobility.  She denies any headaches, denies any change in vision and no focal weakness.      OTHER MEDICAL CONDITIONS: Obesity status post gastric bypass surgery, acquired lymphedema, dizziness, and dyspnea.   REVIEW OF SYSTEMS: Full 14 system review of systems performed and negative with exception of: As noted in the HPI.  ALLERGIES: Allergies  Allergen Reactions   Zoledronic Acid     Other reaction(s): fever and flu-like symptoms   Azithromycin Diarrhea    Other reaction(s): diarrhea   Lac Bovis Diarrhea    Prednisone Diarrhea   Sulfa Antibiotics Itching   Tape Dermatitis    HOME MEDICATIONS: Outpatient Medications Prior to Visit  Medication Sig Dispense Refill   albuterol (VENTOLIN HFA) 108 (90 Base) MCG/ACT inhaler Inhale 2 puffs into the lungs in the morning, at noon, in the evening, and at bedtime.     calcitRIOL (ROCALTROL) 0.5 MCG capsule Take 0.5 mcg by mouth daily.  5   Cholecalciferol (CVS D3) 125 MCG (5000 UT) capsule Take 1 tablet by mouth daily.     denosumab (PROLIA) 60 MG/ML SOSY injection Inject 60 mg into the skin every 6 (six) months.     diltiazem (CARDIZEM) 30 MG tablet Take 1 tablet (30 mg total) by mouth 2 (two) times daily. 60 tablet 2   furosemide (LASIX) 20 MG tablet Take 20 mg by mouth. Take 1 tablet by mouth daily .     Melatonin 10 MG TABS Take 1 tablet by mouth as needed.     Multiple Vitamin (M.V.I. ADULT IV) Take 1 tablet by mouth daily.     pantoprazole (PROTONIX) 20 MG tablet Take 1 tablet (20 mg total) by mouth daily. 90 tablet 3   Vitamin A 2400 MCG (8000 UT) CAPS Take 1 capsule by mouth daily.     Vitamin E 180 MG (400 UNIT) CAPS Take 1 capsule by mouth daily.     No facility-administered medications prior to visit.    PAST MEDICAL HISTORY: Past Medical History:  Diagnosis Date   Asthma    CHF (  congestive heart failure) (HCC)    GERD (gastroesophageal reflux disease)    Internal hemorrhoids    Iron deficiency anemia    Osteoporosis    Peptic ulcer    Tubular adenoma of colon    Vertigo     PAST SURGICAL HISTORY: Past Surgical History:  Procedure Laterality Date   BACK SURGERY     BREAST SURGERY     CHOLECYSTECTOMY     COLONOSCOPY WITH PROPOFOL N/A 06/02/2017   Procedure: COLONOSCOPY WITH PROPOFOL;  Surgeon: Jonathon Bellows, MD;  Location: [facility] ENDOSCOPY;  Service: Gastroenterology;  Laterality: N/A;   COLONOSCOPY WITH PROPOFOL N/A 01/28/2021   Procedure: COLONOSCOPY WITH PROPOFOL;  Surgeon: Jonathon Bellows, MD;  Location: [facility] ENDOSCOPY;  Service:  Gastroenterology;  Laterality: N/A;   ESOPHAGOGASTRODUODENOSCOPY (EGD) WITH PROPOFOL N/A 07/01/2017   Procedure: ESOPHAGOGASTRODUODENOSCOPY (EGD) WITH PROPOFOL;  Surgeon: Jonathon Bellows, MD;  Location: [facility] ENDOSCOPY;  Service: Gastroenterology;  Laterality: N/A;   ESOPHAGOGASTRODUODENOSCOPY (EGD) WITH PROPOFOL N/A 01/28/2021   Procedure: ESOPHAGOGASTRODUODENOSCOPY (EGD) WITH PROPOFOL;  Surgeon: Jonathon Bellows, MD;  Location: [facility] ENDOSCOPY;  Service: Gastroenterology;  Laterality: N/A;   FLEXIBLE SIGMOIDOSCOPY N/A 07/01/2017   Procedure: FLEXIBLE SIGMOIDOSCOPY;  Surgeon: Jonathon Bellows, MD;  Location: [facility] ENDOSCOPY;  Service: Gastroenterology;  Laterality: N/A;   KNEE SURGERY     mini gastric bypass     SHOULDER SURGERY     TONSILLECTOMY      FAMILY HISTORY: Family History  Problem Relation Age of Onset   Hypertension Other    Stroke Other    Diabetes Other    Heart attack Other    Obesity Other    Diabetes Father    Heart disease Father    Colon cancer Father    Breast cancer Paternal Grandmother    Healthy Daughter    Stomach cancer Neg Hx    Pancreatic cancer Neg Hx     SOCIAL HISTORY: Social History   Socioeconomic History   Marital status: Married    Spouse name: Not on file   Number of children: Not on file   Years of education: Not on file   Highest education level: Not on file  Occupational History   Not on file  Tobacco Use   Smoking status: Never   Smokeless tobacco: Never  Vaping Use   Vaping Use: Never used  Substance and Sexual Activity   Alcohol use: No    Alcohol/week: 0.0 standard drinks   Drug use: Never   Sexual activity: Never  Other Topics Concern   Not on file  Social History Narrative   Not on file   Social Determinants of Health   Financial Resource Strain: Not on file  Food Insecurity: Not on file  Transportation Needs: Not on file  Physical Activity: Not on file  Stress: Not on file  Social Connections: Not on file  Intimate Partner  Violence: Not on file    PHYSICAL EXAM  GENERAL EXAM/CONSTITUTIONAL: Vitals:  Vitals:   10/14/21 1400  BP: 101/73  Pulse: 91  Weight: 166 lb (75.3 kg)  Height: 5\' 4"  (1.626 m)   Body mass index is 28.49 kg/m. Wt Readings from Last 3 Encounters:  10/14/21 166 lb (75.3 kg)  10/13/21 177 lb 9.6 oz (80.6 kg)  10/07/21 175 lb 12.8 oz (79.7 kg)   Patient is in no distress; well developed, nourished and groomed; neck is supple  CARDIOVASCULAR: Examination of carotid arteries is normal; no carotid bruits Regular rate and rhythm, no murmurs Examination of peripheral  vascular system by observation and palpation is normal  EYES: Pupils round and reactive to light, Visual fields full to confrontation, Extraocular movements intacts,   MUSCULOSKELETAL: Gait, strength, tone, movements noted in Neurologic exam below  NEUROLOGIC: MENTAL STATUS:  No flowsheet data found. awake, alert, oriented to person, place and time recent and remote memory intact normal attention and concentration language fluent, comprehension intact, naming intact fund of knowledge appropriate  CRANIAL NERVE:  2nd - no papilledema or hemorrhages on fundoscopic exam 2nd, 3rd, 4th, 6th - pupils equal and reactive to light, visual fields full to confrontation, extraocular muscles intact, no nystagmus 5th - facial sensation symmetric 7th - facial strength symmetric 8th - hearing intact 9th - palate elevates symmetrically, uvula midline 11th - shoulder shrug symmetric 12th - tongue protrusion midline  MOTOR:  normal bulk and tone, full strength in the BUE, BLE  SENSORY:  normal and symmetric to light touch, pinprick, temperature, vibration  COORDINATION:  finger-nose-finger, fine finger movements normal  REFLEXES:  deep tendon reflexes present and symmetric  GAIT/STATION:  normal     DIAGNOSTIC DATA (LABS, IMAGING, TESTING) - I reviewed patient records, labs, notes, testing and imaging myself  where available.  Lab Results  Component Value Date   WBC 6.5 05/29/2021   HGB 12.1 05/29/2021   HCT 38.1 05/29/2021   MCV 101.9 (H) 05/29/2021   PLT 191 05/29/2021      Component Value Date/Time   NA 142 10/07/2021 1456   K 3.9 10/07/2021 1456   CL 109 (H) 10/07/2021 1456   CO2 21 10/07/2021 1456   GLUCOSE 78 10/07/2021 1456   GLUCOSE 77 05/29/2021 1251   BUN 7 (L) 10/07/2021 1456   CREATININE 0.53 (L) 10/07/2021 1456   CALCIUM 6.7 (LL) 10/07/2021 1456   PROT 3.9 (LL) 10/07/2021 1456   ALBUMIN 2.1 (L) 10/07/2021 1456   AST 40 10/07/2021 1456   ALT 24 10/07/2021 1456   ALKPHOS 82 10/07/2021 1456   BILITOT 0.5 10/07/2021 1456   GFRNONAA >60 05/29/2021 1251   GFRAA >60 05/02/2020 1308   Lab Results  Component Value Date   CHOL 126 02/25/2018   HDL 58 02/25/2018   LDLCALC 61 02/25/2018   TRIG 35 02/25/2018   CHOLHDL 2.2 02/25/2018   Lab Results  Component Value Date   HGBA1C 4.1 (L) 02/25/2018   Lab Results  Component Value Date   VEHMCNOB09 628 05/29/2021   Lab Results  Component Value Date   TSH 8.930 (H) 10/09/2021    MRI Brain 05/13/2021 1.  No acute intracranial pathology identified.   2.  6 mm left posterior parafalcine meningioma.   3.  Minimal chronic small vessel ischemic change.    ASSESSMENT AND PLAN  71 y.o. 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  MRI showed a 6 mm left posterior parafalcine meningioma.  I informed the patient that this finding is not causing her dizziness.  This finding is most likely an incidental finding and is not causing her worsening medical condition.  There are no surrounding edema in that region.  We will do a surveillance MRI in a year.  Her labs also show a elevated TSH but patient is following with her primary care doctor.  I  also advised the patient to follow-up with ENT as they were planning to complete a CT  scan of the temporal bones.  Follow-up in 1 year or sooner if worse.  At that time we will repeat MRI brain with and without contrast.   1. Meningioma ([place])   2. Dizziness   3. Superior semicircular canal dehiscence, left      Patient Instructions  Continue current medications  Follow up with ENT (possibly complete the CT scan temporal bone)  Follow up in one year, at that time we will repeat MRI Brain    No orders of the defined types were placed in this encounter.   No orders of the defined types were placed in this encounter.   Return in about 1 year (around 10/14/2022).    Alric Ran, MD 10/14/2021, 6:45 PM  [facility] [address] [place], [place] [address] [phone]

## 2021-10-14 NOTE — Patient Instructions (Signed)
Continue current medications  Follow up with ENT (possibly complete the CT scan temporal bone)  Follow up in one year, at that time we will repeat MRI Brain

## 2021-10-14 NOTE — Telephone Encounter (Signed)
Some ongoing fluid in her lungs, please verify she is taking lasix 20mg  daily and if so increase to 40mg  daily. In 1 week check bmet/mg/bnp  Zandra Abts MD

## 2021-10-15 ENCOUNTER — Encounter (HOSPITAL_COMMUNITY): Payer: Self-pay

## 2021-10-15 ENCOUNTER — Ambulatory Visit (HOSPITAL_COMMUNITY)
Admission: RE | Admit: 2021-10-15 | Discharge: 2021-10-15 | Disposition: A | Discharge status: Home / Self Care | Payer: Medicare Other | Source: Ambulatory Visit | Attending: Radiology | Admitting: Radiology

## 2021-10-15 ENCOUNTER — Other Ambulatory Visit: Payer: Self-pay

## 2021-10-15 ENCOUNTER — Ambulatory Visit: Payer: Medicare Other | Admitting: Pulmonary Disease

## 2021-10-15 ENCOUNTER — Ambulatory Visit (HOSPITAL_COMMUNITY)
Admission: RE | Admit: 2021-10-15 | Discharge: 2021-10-15 | Disposition: A | Discharge status: Home / Self Care | Payer: Medicare Other | Source: Ambulatory Visit | Attending: Pulmonary Disease | Admitting: Pulmonary Disease

## 2021-10-15 DIAGNOSIS — Z9889 Other specified postprocedural states: Secondary | ICD-10-CM

## 2021-10-15 DIAGNOSIS — J90 Pleural effusion, not elsewhere classified: Secondary | ICD-10-CM | POA: Diagnosis present

## 2021-10-15 LAB — BODY FLUID CELL COUNT WITH DIFFERENTIAL
Lymphs, Fluid: 53 %
Monocyte-Macrophage-Serous Fluid: 18 % — ABNORMAL LOW (ref 50–90)
Neutrophil Count, Fluid: 29 % — ABNORMAL HIGH (ref 0–25)
Total Nucleated Cell Count, Fluid: 68 cu mm (ref 0–1000)

## 2021-10-15 LAB — PROTEIN, PLEURAL OR PERITONEAL FLUID: Total protein, fluid: <3 g/dL

## 2021-10-15 LAB — GRAM STAIN

## 2021-10-15 LAB — LACTATE DEHYDROGENASE, PLEURAL OR PERITONEAL FLUID: LD, Fluid: 43 U/L — ABNORMAL HIGH (ref 3–23)

## 2021-10-15 NOTE — Progress Notes (Signed)
PT tolerated right sided thoracentesis procedure well today and 1 Liter of dark yellow fluid removed with labs collected and sent for processing. PT taken to xray department at this time via wheelchair with NAD for post chest xray. Pt verbalized understanding of discharge instructions.

## 2021-10-15 NOTE — Procedures (Signed)
° °  US guided Right thoracentesis  1 liter straw colored fluid obtained Sent for labs per MD  Tolerated well Post CXR:  NO PTX per Dr Maryelizabeth Kaufmann

## 2021-10-16 ENCOUNTER — Ambulatory Visit (HOSPITAL_COMMUNITY): Payer: Medicare Other | Attending: Internal Medicine | Admitting: Physical Therapy

## 2021-10-16 ENCOUNTER — Ambulatory Visit: Payer: Medicare Other | Admitting: Rheumatology

## 2021-10-16 DIAGNOSIS — R262 Difficulty in walking, not elsewhere classified: Secondary | ICD-10-CM | POA: Insufficient documentation

## 2021-10-16 DIAGNOSIS — I890 Lymphedema, not elsewhere classified: Secondary | ICD-10-CM | POA: Diagnosis present

## 2021-10-16 DIAGNOSIS — R296 Repeated falls: Secondary | ICD-10-CM

## 2021-10-16 DIAGNOSIS — M720 Palmar fascial fibromatosis [Dupuytren]: Secondary | ICD-10-CM

## 2021-10-16 DIAGNOSIS — Z8781 Personal history of (healed) traumatic fracture: Secondary | ICD-10-CM

## 2021-10-16 DIAGNOSIS — E538 Deficiency of other specified B group vitamins: Secondary | ICD-10-CM

## 2021-10-16 DIAGNOSIS — M19041 Primary osteoarthritis, right hand: Secondary | ICD-10-CM

## 2021-10-16 DIAGNOSIS — Z9884 Bariatric surgery status: Secondary | ICD-10-CM

## 2021-10-16 DIAGNOSIS — D500 Iron deficiency anemia secondary to blood loss (chronic): Secondary | ICD-10-CM

## 2021-10-16 DIAGNOSIS — M19012 Primary osteoarthritis, left shoulder: Secondary | ICD-10-CM

## 2021-10-16 DIAGNOSIS — R42 Dizziness and giddiness: Secondary | ICD-10-CM

## 2021-10-16 DIAGNOSIS — Z8739 Personal history of other diseases of the musculoskeletal system and connective tissue: Secondary | ICD-10-CM

## 2021-10-16 DIAGNOSIS — N2581 Secondary hyperparathyroidism of renal origin: Secondary | ICD-10-CM

## 2021-10-16 DIAGNOSIS — E559 Vitamin D deficiency, unspecified: Secondary | ICD-10-CM

## 2021-10-16 DIAGNOSIS — M5136 Other intervertebral disc degeneration, lumbar region: Secondary | ICD-10-CM

## 2021-10-16 DIAGNOSIS — Z8709 Personal history of other diseases of the respiratory system: Secondary | ICD-10-CM

## 2021-10-16 DIAGNOSIS — K219 Gastro-esophageal reflux disease without esophagitis: Secondary | ICD-10-CM

## 2021-10-16 DIAGNOSIS — M1811 Unilateral primary osteoarthritis of first carpometacarpal joint, right hand: Secondary | ICD-10-CM

## 2021-10-16 NOTE — Therapy (Signed)
[facility] [address] [place], [place], [address] Phone: [phone]   Fax:  [phone]  Physical Therapy Treatment Progress Note Reporting Period 09/24/2021 to 10/16/2021  See note below for Objective Data and Assessment of Progress/Goals.     Patient Details  Name: [name] MRN: [id number] Date of Birth: [birth year] Referring Provider (PT): Stana Bunting   Encounter Date: 10/16/2021   PT End of Session - 10/16/21 1728     Visit Number 10    Number of Visits 15    Date for PT Re-Evaluation 10/29/21    Authorization Type UHC medicare    Progress Note Due on Visit 20    PT Start Time 1050    PT Stop Time 1205    PT Time Calculation (min) 75 min    Activity Tolerance Patient tolerated treatment well    Behavior During Therapy WFL for tasks assessed/performed             Past Medical History:  Diagnosis Date   Asthma    CHF (congestive heart failure) (HCC)    GERD (gastroesophageal reflux disease)    Internal hemorrhoids    Iron deficiency anemia    Osteoporosis    Peptic ulcer    Tubular adenoma of colon    Vertigo     Past Surgical History:  Procedure Laterality Date   BACK SURGERY     BREAST SURGERY     CHOLECYSTECTOMY     COLONOSCOPY WITH PROPOFOL N/A 06/02/2017   Procedure: COLONOSCOPY WITH PROPOFOL;  Surgeon: Jonathon Bellows, MD;  Location: [facility] ENDOSCOPY;  Service: Gastroenterology;  Laterality: N/A;   COLONOSCOPY WITH PROPOFOL N/A 01/28/2021   Procedure: COLONOSCOPY WITH PROPOFOL;  Surgeon: Jonathon Bellows, MD;  Location: [facility] ENDOSCOPY;  Service: Gastroenterology;  Laterality: N/A;   ESOPHAGOGASTRODUODENOSCOPY (EGD) WITH PROPOFOL N/A 07/01/2017   Procedure: ESOPHAGOGASTRODUODENOSCOPY (EGD) WITH PROPOFOL;  Surgeon: Jonathon Bellows, MD;  Location: [facility] ENDOSCOPY;  Service: Gastroenterology;  Laterality: N/A;   ESOPHAGOGASTRODUODENOSCOPY (EGD) WITH PROPOFOL N/A 01/28/2021   Procedure:  ESOPHAGOGASTRODUODENOSCOPY (EGD) WITH PROPOFOL;  Surgeon: Jonathon Bellows, MD;  Location: [facility] ENDOSCOPY;  Service: Gastroenterology;  Laterality: N/A;   FLEXIBLE SIGMOIDOSCOPY N/A 07/01/2017   Procedure: FLEXIBLE SIGMOIDOSCOPY;  Surgeon: Jonathon Bellows, MD;  Location: [facility] ENDOSCOPY;  Service: Gastroenterology;  Laterality: N/A;   KNEE SURGERY     mini gastric bypass     SHOULDER SURGERY     TONSILLECTOMY      There were no vitals filed for this visit.   Subjective Assessment - 10/16/21 1728     Subjective Pt reports she is going to have more tests including a CT scan.  Had fluid removed from her Rt lung this morning and testing of the fluid came back abnormal    Currently in Pain? No/denies                Below measurements taken on 10/08/21 and compared to measures taken at evaluation 09/24/21   LYMPHEDEMA/ONCOLOGY QUESTIONNAIRE - 10/16/21 1733       Right Lower Extremity Lymphedema   20 cm Proximal to Suprapatella 60.8 cm   was 58   10 cm Proximal to Suprapatella 55 cm   was 52.4   At Midpatella/Popliteal Crease 46.7 cm   was44.5   30 cm Proximal to Floor at Lateral Plantar Foot 45 cm   42.5   20 cm Proximal to Floor at Lateral Plantar Foot 43.5 1   39.8   10 cm Proximal  to Floor at Lateral Malleoli 31.3 cm   30   Circumference of ankle/heel 35 cm.   33.8   5 cm Proximal to 1st MTP Joint 22.5 cm   22   Across MTP Joint 22.8 cm   22.5     Left Lower Extremity Lymphedema   20 cm Proximal to Suprapatella 59.5 cm   was 58   10 cm Proximal to Suprapatella 55.8 cm   53   At Midpatella/Popliteal Crease 55.8 cm   44   30 cm Proximal to Floor at Lateral Plantar Foot 47.8 cm   44.5   20 cm Proximal to Floor at Lateral Plantar Foot 45 cm   43.5   10 cm Proximal to Floor at Lateral Malleoli 32.5 cm   30.5   Circumference of ankle/heel 36 cm.   34   5 cm Proximal to 1st MTP Joint 23 cm   22.8   Across MTP Joint 23 cm   23.2                       [facility] Adult PT  Treatment/Exercise - 10/16/21 0001       Manual Therapy   Manual Therapy Compression Bandaging    Manual therapy comments completed seperate from all other aspects    Manual Lymphatic Drainage (MLD) to include supraclavicular, deep and superfical abdominal, inguinal axillary anastomosis followed by B LEboth anterior and posteriorly    Compression Bandaging multilayer short stretch bandages with 1/2" foam from foot to thighB                       PT Short Term Goals - 10/16/21 1737       PT SHORT TERM GOAL #1   Title Pt to be I in self manual techniques.    Time 2    Period Weeks    Status On-going    Target Date 10/08/21      PT SHORT TERM GOAL #2   Title PT to have decreased 1.5cm in thigh and leg,    Time 2    Period Weeks    Status On-going               PT Long Term Goals - 10/16/21 1737       PT LONG TERM GOAL #1   Title PT to have ankle foot wrap, LE and thigh juxtafit and be able to Timmothy Sours I    Time 5    Period Weeks    Status On-going    Target Date 10/29/21      PT LONG TERM GOAL #2   Title PT to have lost between 3 cm from thigh and leg measurements on average,    Time 5    Period Weeks    Status On-going      PT LONG TERM GOAL #3   Title PT pain to be no greater than a 6/10 in LE    Time 5    Period Weeks    Status On-going                   Plan - 10/16/21 1729     Clinical Impression Statement LEs with noted increase in edema and induration today.  Did not measure because of this and will do tomorrow as she returns for another appointment.  Measurements above were taken last week and pt has been battling health issues since returning to therapy.  PTs LE were actually bigger last week than at initial evaluation.  Noted fullness in Rt axillary region when routing up that was not present on Lt side.  Noted increased weakness and reduced mobility in the past 2 weeks as well.  Pt is hopeful the medical doctors will discover what  is making her feel this way.  Pt will continue to benefit from skilled lymphatic therapy to manage and reduce her lymphedema.   Continued with manual lymph drainage and full bandaging to thigh bilaterally.  Therapist had to help pt with Lt LE when getting into car today due to weakness.    Personal Factors and Comorbidities Comorbidity 2;Comorbidity 3+;Fitness;Time since onset of injury/illness/exacerbation    Comorbidities gastric bypass followed by plastic surgery to remove excessive skin as pt lost over 250#, OA,    Examination-Activity Limitations Dressing;Locomotion Level;Stand    Examination-Participation Restrictions Cleaning;Community Activity;Shop;Meal Prep    Stability/Clinical Decision Making Evolving/Moderate complexity    Rehab Potential Good    PT Frequency 3x / week    PT Duration 6 weeks    PT Treatment/Interventions Manual techniques;Therapeutic activities;Patient/family education;Manual lymph drainage;Therapeutic exercise    PT Next Visit Plan Continue with complete lymphatic therapy.  Measure tomorrow as did not complete today due to increased edema. Continue complete lymphatic therapy.    PT Home Exercise Plan ankle pump, LAQ, hip ab/adduction, march, diaphragmic breathing and lymphatic squeeze.; self manual    Consulted and Agree with Plan of Care Patient             Patient will benefit from skilled therapeutic intervention in order to improve the following deficits and impairments:  Pain, Difficulty walking, Increased edema  Visit Diagnosis: Lymphedema, not elsewhere classified  Difficulty in walking, not elsewhere classified     Problem List Patient Active Problem List   Diagnosis Date Noted   Bariatric surgery status 10/07/2021   Cellulitis of umbilicus 16/06/9603   Edema 10/07/2021   Essential tremor 10/07/2021   History of adenomatous polyp of colon 10/07/2021   Hypoalbuminemia 10/07/2021   Hypocalcemia 10/07/2021   Insomnia 10/07/2021   Internal  hemorrhoids 10/07/2021   Kidney stone 10/07/2021   Low back pain 10/07/2021   Malnutrition ([place]) 10/07/2021   Osteoporosis 10/07/2021   Pathological fracture of vertebra 10/07/2021   Peptic ulcer disease 10/07/2021   Spontaneous ecchymosis 10/07/2021   Unilateral primary osteoarthritis, unspecified knee 05/06/2021   Acute exacerbation of chronic obstructive pulmonary disease (COPD) ([place]) 04/08/2021   History of anemia 04/08/2021   History of asthma 04/08/2021   History of COVID-19 04/08/2021   Shortness of breath 04/08/2021   Acute COVID-19 03/08/2021   Cough 03/08/2021   Fever 03/08/2021   Colon cancer screening 01/08/2021   Arthritis of right hand 11/15/2019   Age-related osteoporosis without current pathological fracture 10/30/2019   Left-sided weakness    Stroke-like symptoms 02/24/2018   HTN (hypertension) 02/24/2018   Primary osteoarthritis of both hands 11/03/2017   Dupuytren's contracture of left hand 11/03/2017   DDD (degenerative disc disease), lumbar 11/03/2017   History of gastric bypass 11/03/2017   Lymphedema 09/17/2017   History of vertebral fracture 09/17/2017   History of osteoporosis 09/17/2017   Iron deficiency anemia due to chronic blood loss 06/28/2017   Lower GI bleed 05/31/2017   Rectal mass 05/31/2017   Asthma 05/31/2017   GERD (gastroesophageal reflux disease) 05/31/2017   Closed fracture of upper end of humerus 09/23/2016   Tendinitis 09/23/2016   Patulous eustachian tube of right ear  11/07/2015   Superior semicircular canal dehiscence, 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 [place], [place], [address] Phone: [phone]   Fax:  [phone]  Name: Kynzlie Ryals Burns MRN: [id number] Date of Birth: 14-Oct-1950

## 2021-10-16 NOTE — Telephone Encounter (Signed)
Patient states that she has been doing the 20mg  daily.  States that she had a lot of fluid drawn off her right lung yesterday morning & is breathing a lot better now.  She will continue her 20mg  for now since she is feeling better.  Will call back if anything changes.

## 2021-10-17 ENCOUNTER — Other Ambulatory Visit: Payer: Self-pay

## 2021-10-17 ENCOUNTER — Ambulatory Visit (HOSPITAL_COMMUNITY): Payer: Medicare Other

## 2021-10-17 ENCOUNTER — Encounter (HOSPITAL_COMMUNITY): Payer: Self-pay

## 2021-10-17 DIAGNOSIS — I890 Lymphedema, not elsewhere classified: Secondary | ICD-10-CM | POA: Diagnosis not present

## 2021-10-17 DIAGNOSIS — R262 Difficulty in walking, not elsewhere classified: Secondary | ICD-10-CM

## 2021-10-17 LAB — CYTOLOGY - NON PAP

## 2021-10-17 NOTE — Therapy (Signed)
[facility] [place], [place], [address] Phone: [phone]   Fax:  [phone]  Physical Therapy Treatment  Patient Details  Name: [name] MRN: [id number] Date of Birth: [birth year] Referring Provider (PT): Stana Bunting   Encounter Date: 10/17/2021   PT End of Session - 10/17/21 0951     Visit Number 11    Number of Visits 15    Date for PT Re-Evaluation 10/29/21    Authorization Type UHC medicare    Progress Note Due on Visit 20    PT Start Time 0828    PT Stop Time 0948    PT Time Calculation (min) 80 min    Activity Tolerance Patient tolerated treatment well    Behavior During Therapy [facility] for tasks assessed/performed             Past Medical History:  Diagnosis Date   Asthma    CHF (congestive heart failure) (HCC)    GERD (gastroesophageal reflux disease)    Internal hemorrhoids    Iron deficiency anemia    Osteoporosis    Peptic ulcer    Tubular adenoma of colon    Vertigo     Past Surgical History:  Procedure Laterality Date   BACK SURGERY     BREAST SURGERY     CHOLECYSTECTOMY     COLONOSCOPY WITH PROPOFOL N/A 06/02/2017   Procedure: COLONOSCOPY WITH PROPOFOL;  Surgeon: Jonathon Bellows, MD;  Location: [facility] ENDOSCOPY;  Service: Gastroenterology;  Laterality: N/A;   COLONOSCOPY WITH PROPOFOL N/A 01/28/2021   Procedure: COLONOSCOPY WITH PROPOFOL;  Surgeon: Jonathon Bellows, MD;  Location: [facility] ENDOSCOPY;  Service: Gastroenterology;  Laterality: N/A;   ESOPHAGOGASTRODUODENOSCOPY (EGD) WITH PROPOFOL N/A 07/01/2017   Procedure: ESOPHAGOGASTRODUODENOSCOPY (EGD) WITH PROPOFOL;  Surgeon: Jonathon Bellows, MD;  Location: [facility] ENDOSCOPY;  Service: Gastroenterology;  Laterality: N/A;   ESOPHAGOGASTRODUODENOSCOPY (EGD) WITH PROPOFOL N/A 01/28/2021   Procedure: ESOPHAGOGASTRODUODENOSCOPY (EGD) WITH PROPOFOL;  Surgeon: Jonathon Bellows, MD;  Location: [facility] ENDOSCOPY;  Service: Gastroenterology;  Laterality: N/A;    FLEXIBLE SIGMOIDOSCOPY N/A 07/01/2017   Procedure: FLEXIBLE SIGMOIDOSCOPY;  Surgeon: Jonathon Bellows, MD;  Location: [facility] ENDOSCOPY;  Service: Gastroenterology;  Laterality: N/A;   KNEE SURGERY     mini gastric bypass     SHOULDER SURGERY     TONSILLECTOMY      There were no vitals filed for this visit.   Subjective Assessment - 10/17/21 0947     Subjective Pt stated she is a little nervous for CT scan for her abdominal later today.  Reports increased swelling Rt breast and abdominal region, noted when she woke up.  Increased discomfort on Rt side of abdominal region.    Pertinent History lymphedema, tumor of the brain , CHF,    Currently in Pain? No/denies                   LYMPHEDEMA/ONCOLOGY QUESTIONNAIRE - 10/17/21 0001       Right Lower Extremity Lymphedema   20 cm Proximal to Suprapatella 60.5 cm   initial eval 1/11 was: 61   10 cm Proximal to Suprapatella 53.5 cm   initial eval 1/11 was: 54   At Midpatella/Popliteal Crease 48.5 cm   initial eval 1/11 was: 46.8   30 cm Proximal to Floor at Lateral Plantar Foot 42.5 cm   initial eval 1/11 was: 46   20 cm Proximal to Floor at Lateral Plantar Foot 42.8 1   initial eval 1/11 was: 43.2  10 cm Proximal to Floor at Lateral Malleoli 33 cm   initial eval 1/11 was: 30.5   Circumference of ankle/heel 34.2 cm.   initial eval 1/11 was: 34.5   5 cm Proximal to 1st MTP Joint 23.1 cm   initial eval 1/11 was: 22.5   Across MTP Joint 22.5 cm   initial eval 1/11 was: 23.5     Left Lower Extremity Lymphedema   20 cm Proximal to Suprapatella 56.8 cm   initial eval 1/11 was: 59   10 cm Proximal to Suprapatella 53 cm   initial eval 1/11 was: 56.2   At Midpatella/Popliteal Crease 47 cm   initial eval 1/11 was: 46.4   30 cm Proximal to Floor at Lateral Plantar Foot 45.3 cm   initial eval 1/11 was: 46.8   20 cm Proximal to Floor at Lateral Plantar Foot 43.8 cm   initial eval 1/11 was: 43.4   10 cm Proximal to Floor at Lateral Malleoli 34.5 cm    initial eval 1/11 was: 32.8   Circumference of ankle/heel 35 cm.   initial eval 1/11 was: 36.2   5 cm Proximal to 1st MTP Joint 24.5 cm   initial eval 1/11 was: 22.3   Across MTP Joint 23 cm   initial eval 1/11 was: 22.8                       [facility] Adult PT Treatment/Exercise - 10/17/21 0001       Manual Therapy   Manual Therapy Compression Bandaging    Manual therapy comments completed seperate from all other aspects    Manual Lymphatic Drainage (MLD) to include supraclavicular, deep and superfical abdominal, inguinal axillary anastomosis followed by B LEboth anterior and posteriorly    Compression Bandaging multilayer short stretch bandages with 1/2" foam from foot to thighB    Other Manual Therapy measurements                       PT Short Term Goals - 10/16/21 1737       PT SHORT TERM GOAL #1   Title Pt to be I in self manual techniques.    Time 2    Period Weeks    Status On-going    Target Date 10/08/21      PT SHORT TERM GOAL #2   Title PT to have decreased 1.5cm in thigh and leg,    Time 2    Period Weeks    Status On-going               PT Long Term Goals - 10/16/21 1737       PT LONG TERM GOAL #1   Title PT to have ankle foot wrap, LE and thigh juxtafit and be able to Timmothy Sours I    Time 5    Period Weeks    Status On-going    Target Date 10/29/21      PT LONG TERM GOAL #2   Title PT to have lost between 3 cm from thigh and leg measurements on average,    Time 5    Period Weeks    Status On-going      PT LONG TERM GOAL #3   Title PT pain to be no greater than a 6/10 in LE    Time 5    Period Weeks    Status On-going  Plan - 10/17/21 1748     Clinical Impression Statement Measurements taken this session with some increase and other measurements reduced, not consistent.  Pt presents with jaudice skin, change in hair color and increased fatigue with gait and breathing.  Manual lymphedema  decognestive technqiues with noted incrased edema and induration BLE, Rt axillary and Rt breast with increased tenderness in Rt upper quadrant.  Heavy layer of lotion to address dry skin prior application of multilayer short stretch bandages with 1/2in foam foot t.o Bil thights.  EOS therapist had to assist pt with Lt LE getting into car due to weakness.  Pt with CT scan later today for abdominal region    Personal Factors and Comorbidities Comorbidity 2;Comorbidity 3+;Fitness;Time since onset of injury/illness/exacerbation    Comorbidities gastric bypass followed by plastic surgery to remove excessive skin as pt lost over 250#, OA,    Examination-Activity Limitations Dressing;Locomotion Level;Stand    Examination-Participation Restrictions Cleaning;Community Activity;Shop;Meal Prep    Stability/Clinical Decision Making Evolving/Moderate complexity    Clinical Decision Making Moderate    Rehab Potential Good    PT Frequency 3x / week    PT Duration 6 weeks    PT Treatment/Interventions Manual techniques;Therapeutic activities;Patient/family education;Manual lymph drainage;Therapeutic exercise    PT Next Visit Plan Continue with complete lymphatic therapy.  Measure weekly. Continue complete lymphatic therapy.    PT Home Exercise Plan ankle pump, LAQ, hip ab/adduction, march, diaphragmic breathing and lymphatic squeeze.; self manual    Consulted and Agree with Plan of Care Patient             Patient will benefit from skilled therapeutic intervention in order to improve the following deficits and impairments:  Pain, Difficulty walking, Increased edema  Visit Diagnosis: Lymphedema, not elsewhere classified  Difficulty in walking, not elsewhere classified     Problem List Patient Active Problem List   Diagnosis Date Noted   Bariatric surgery status 10/07/2021   Cellulitis of umbilicus 28/36/6294   Edema 10/07/2021   Essential tremor 10/07/2021   History of adenomatous polyp of colon  10/07/2021   Hypoalbuminemia 10/07/2021   Hypocalcemia 10/07/2021   Insomnia 10/07/2021   Internal hemorrhoids 10/07/2021   Kidney stone 10/07/2021   Low back pain 10/07/2021   Malnutrition ([place]) 10/07/2021   Osteoporosis 10/07/2021   Pathological fracture of vertebra 10/07/2021   Peptic ulcer disease 10/07/2021   Spontaneous ecchymosis 10/07/2021   Unilateral primary osteoarthritis, unspecified knee 05/06/2021   Acute exacerbation of chronic obstructive pulmonary disease (COPD) ([place]) 04/08/2021   History of anemia 04/08/2021   History of asthma 04/08/2021   History of COVID-19 04/08/2021   Shortness of breath 04/08/2021   Acute COVID-19 03/08/2021   Cough 03/08/2021   Fever 03/08/2021   Colon cancer screening 01/08/2021   Arthritis of right hand 11/15/2019   Age-related osteoporosis without current pathological fracture 10/30/2019   Left-sided weakness    Stroke-like symptoms 02/24/2018   HTN (hypertension) 02/24/2018   Primary osteoarthritis of both hands 11/03/2017   Dupuytren's contracture of left hand 11/03/2017   DDD (degenerative disc disease), lumbar 11/03/2017   History of gastric bypass 11/03/2017   Lymphedema 09/17/2017   History of vertebral fracture 09/17/2017   History of osteoporosis 09/17/2017   Iron deficiency anemia due to chronic blood loss 06/28/2017   Lower GI bleed 05/31/2017   Rectal mass 05/31/2017   Asthma 05/31/2017   GERD (gastroesophageal reflux disease) 05/31/2017   Closed fracture of upper end of humerus 09/23/2016   Tendinitis 09/23/2016  Patulous eustachian tube of right ear 11/07/2015   [place] semicircular canal dehiscence, bilateral 11/07/2015   Vitamin D deficiency 08/20/2014   Secondary hyperparathyroidism ([place]) 08/20/2014   Ihor Austin, LPTA/CLT; CBIS [phone]  Aldona Lento, PTA 10/17/2021, 5:55 PM  [facility] [address] [place], [place], [address] Phone:  [phone]   Fax:  [phone]  Name: [name] MRN: [id number] Date of Birth: 05-Jun-1951

## 2021-10-20 ENCOUNTER — Ambulatory Visit (INDEPENDENT_AMBULATORY_CARE_PROVIDER_SITE_OTHER): Payer: Medicare Other

## 2021-10-20 ENCOUNTER — Ambulatory Visit (INDEPENDENT_AMBULATORY_CARE_PROVIDER_SITE_OTHER): Payer: Medicare Other | Admitting: Nurse Practitioner

## 2021-10-20 ENCOUNTER — Encounter: Payer: Self-pay | Admitting: Nurse Practitioner

## 2021-10-20 ENCOUNTER — Other Ambulatory Visit: Payer: Self-pay

## 2021-10-20 ENCOUNTER — Ambulatory Visit (HOSPITAL_COMMUNITY): Payer: Medicare Other | Admitting: Physical Therapy

## 2021-10-20 VITALS — BP 110/72 | HR 86 | Temp 98.0°F | Ht 64.0 in | Wt 169.4 lb

## 2021-10-20 DIAGNOSIS — J45909 Unspecified asthma, uncomplicated: Secondary | ICD-10-CM

## 2021-10-20 DIAGNOSIS — J90 Pleural effusion, not elsewhere classified: Secondary | ICD-10-CM | POA: Diagnosis not present

## 2021-10-20 DIAGNOSIS — I890 Lymphedema, not elsewhere classified: Secondary | ICD-10-CM

## 2021-10-20 DIAGNOSIS — I4729 Other ventricular tachycardia: Secondary | ICD-10-CM

## 2021-10-20 DIAGNOSIS — I509 Heart failure, unspecified: Secondary | ICD-10-CM | POA: Insufficient documentation

## 2021-10-20 DIAGNOSIS — R0602 Shortness of breath: Secondary | ICD-10-CM

## 2021-10-20 DIAGNOSIS — I5032 Chronic diastolic (congestive) heart failure: Secondary | ICD-10-CM | POA: Diagnosis not present

## 2021-10-20 DIAGNOSIS — R262 Difficulty in walking, not elsewhere classified: Secondary | ICD-10-CM

## 2021-10-20 DIAGNOSIS — I5042 Chronic combined systolic (congestive) and diastolic (congestive) heart failure: Secondary | ICD-10-CM | POA: Diagnosis not present

## 2021-10-20 LAB — BASIC METABOLIC PANEL
BUN: 9 mg/dL (ref 6–23)
CO2: 28 mEq/L (ref 19–32)
Calcium: 6.9 mg/dL — ABNORMAL LOW (ref 8.4–10.5)
Chloride: 108 mEq/L (ref 96–112)
Creatinine, Ser: 0.56 mg/dL (ref 0.40–1.20)
GFR: 93.82 mL/min (ref >60.00)
Glucose, Bld: 82 mg/dL (ref 70–99)
Potassium: 3.4 mEq/L — ABNORMAL LOW (ref 3.5–5.1)
Sodium: 143 mEq/L (ref 135–145)

## 2021-10-20 LAB — BRAIN NATRIURETIC PEPTIDE: Pro B Natriuretic peptide (BNP): 80 pg/mL (ref 0.0–100.0)

## 2021-10-20 LAB — NITRIC OXIDE: Nitric Oxide: 6

## 2021-10-20 MED ORDER — POTASSIUM CHLORIDE CRYS ER 20 MEQ PO TBCR: 20.0000 meq | EXTENDED_RELEASE_TABLET | Freq: Every day | ORAL | 2 refills | Status: DC

## 2021-10-20 MED ORDER — FUROSEMIDE 40 MG PO TABS: 40.0000 mg | ORAL_TABLET | Freq: Every day | ORAL | 2 refills | Status: DC

## 2021-10-20 NOTE — Assessment & Plan Note (Signed)
Worsening BLE lymphedema. Ongoing issues since 2019. Previously followed by Dr. Estanislado Pandy - may need to return. Will await BMET and increase lasix.

## 2021-10-20 NOTE — Assessment & Plan Note (Signed)
EF on echo 45-50%, G1DD. Preserved EF on stress test. BNP today for further evaluation. Will obtain BMET as well and likely increase lasix.

## 2021-10-20 NOTE — Addendum Note (Signed)
Addended by: Clayton Bibles on: 10/20/2021 02:16 PM   Modules accepted: Orders

## 2021-10-20 NOTE — Progress Notes (Signed)
Spoke with pt and voices understanding. Pt did not have any further questions.

## 2021-10-20 NOTE — Assessment & Plan Note (Addendum)
Pt has not noticed a significant improvement in symptoms since starting dilt. No recent palpitations. Will need to f/u with cardiology as scheduled.

## 2021-10-20 NOTE — Therapy (Signed)
[place], [place], [address] Phone: [phone]   Fax:  [phone]  Physical Therapy Treatment  Patient Details  Name: [name] MRN: [id number] Date of Birth: June 08, 1951 Referring Provider (PT): Stana Bunting   Encounter Date: 10/20/2021   PT End of Session - 10/20/21 1430     Visit Number 12    Number of Visits 15    Date for PT Re-Evaluation 10/29/21    Authorization Type [employer] medicare    Progress Note Due on Visit 20    PT Start Time 1320    PT Stop Time 1405    PT Time Calculation (min) 45 min    Activity Tolerance Patient tolerated treatment well    Behavior During Therapy [facility] for tasks assessed/performed             Past Medical History:  Diagnosis Date   Asthma    CHF (congestive heart failure) (HCC)    GERD (gastroesophageal reflux disease)    Internal hemorrhoids    Iron deficiency anemia    Osteoporosis    Peptic ulcer    Tubular adenoma of colon    Vertigo     Past Surgical History:  Procedure Laterality Date   BACK SURGERY     BREAST SURGERY     CHOLECYSTECTOMY     COLONOSCOPY WITH PROPOFOL N/A 06/02/2017   Procedure: COLONOSCOPY WITH PROPOFOL;  Surgeon: Jonathon Bellows, MD;  Location: [facility] ENDOSCOPY;  Service: Gastroenterology;  Laterality: N/A;   COLONOSCOPY WITH PROPOFOL N/A 01/28/2021   Procedure: COLONOSCOPY WITH PROPOFOL;  Surgeon: Jonathon Bellows, MD;  Location: [facility] ENDOSCOPY;  Service: Gastroenterology;  Laterality: N/A;   ESOPHAGOGASTRODUODENOSCOPY (EGD) WITH PROPOFOL N/A 07/01/2017   Procedure: ESOPHAGOGASTRODUODENOSCOPY (EGD) WITH PROPOFOL;  Surgeon: Jonathon Bellows, MD;  Location: [facility] ENDOSCOPY;  Service: Gastroenterology;  Laterality: N/A;   ESOPHAGOGASTRODUODENOSCOPY (EGD) WITH PROPOFOL N/A 01/28/2021   Procedure: ESOPHAGOGASTRODUODENOSCOPY (EGD) WITH PROPOFOL;  Surgeon: Jonathon Bellows, MD;  Location: [facility] ENDOSCOPY;  Service: Gastroenterology;  Laterality: N/A;    FLEXIBLE SIGMOIDOSCOPY N/A 07/01/2017   Procedure: FLEXIBLE SIGMOIDOSCOPY;  Surgeon: Jonathon Bellows, MD;  Location: [facility] ENDOSCOPY;  Service: Gastroenterology;  Laterality: N/A;   KNEE SURGERY     mini gastric bypass     SHOULDER SURGERY     TONSILLECTOMY      There were no vitals filed for this visit.   Subjective Assessment - 10/20/21 1428     Subjective Pt states she has another MD appt in [place] and needs to leave asap.  Requests wrapping only this visit.  States she has not been given the results of her CT scan last week yet.  STates she remains weak and has no appetite.    Currently in Pain? No/denies                               [facility] Adult PT Treatment/Exercise - 10/20/21 0001       Manual Therapy   Manual Therapy Compression Bandaging    Manual therapy comments completed seperate from all other aspects    Compression Bandaging multilayer short stretch bandages with 1/2" foam from foot to thighB                       PT Short Term Goals - 10/16/21 1737       PT SHORT TERM GOAL #1   Title Pt to be  I in self manual techniques.    Time 2    Period Weeks    Status On-going    Target Date 10/08/21      PT SHORT TERM GOAL #2   Title PT to have decreased 1.5cm in thigh and leg,    Time 2    Period Weeks    Status On-going               PT Long Term Goals - 10/16/21 1737       PT LONG TERM GOAL #1   Title PT to have ankle foot wrap, LE and thigh juxtafit and be able to Timmothy Sours I    Time 5    Period Weeks    Status On-going    Target Date 10/29/21      PT LONG TERM GOAL #2   Title PT to have lost between 3 cm from thigh and leg measurements on average,    Time 5    Period Weeks    Status On-going      PT LONG TERM GOAL #3   Title PT pain to be no greater than a 6/10 in LE    Time 5    Period Weeks    Status On-going                   Plan - 10/20/21 1431     Clinical Impression Statement Completed  bandaging only this session per request due to conflicting appt in [place].  Pt continues to have reduced enegry and edema remains unchanged in distal LE's.  Pt hoping to get some answers from urologist this evening.    Personal Factors and Comorbidities Comorbidity 2;Comorbidity 3+;Fitness;Time since onset of injury/illness/exacerbation    Comorbidities gastric bypass followed by plastic surgery to remove excessive skin as pt lost over 250#, OA,    Examination-Activity Limitations Dressing;Locomotion Level;Stand    Examination-Participation Restrictions Cleaning;Community Activity;Shop;Meal Prep    Stability/Clinical Decision Making Evolving/Moderate complexity    Rehab Potential Good    PT Frequency 3x / week    PT Duration 6 weeks    PT Treatment/Interventions Manual techniques;Therapeutic activities;Patient/family education;Manual lymph drainage;Therapeutic exercise    PT Next Visit Plan Continue with complete lymphatic therapy.  Measure weekly. F/U with Urologist appt and results from CT scan if received.    PT Home Exercise Plan ankle pump, LAQ, hip ab/adduction, march, diaphragmic breathing and lymphatic squeeze.; self manual    Consulted and Agree with Plan of Care Patient             Patient will benefit from skilled therapeutic intervention in order to improve the following deficits and impairments:  Pain, Difficulty walking, Increased edema  Visit Diagnosis: Lymphedema, not elsewhere classified  Difficulty in walking, not elsewhere classified     Problem List Patient Active Problem List   Diagnosis Date Noted   Pleural effusion 10/20/2021   Chronic heart failure ([place]) 10/20/2021   NSVT (nonsustained ventricular tachycardia) 10/20/2021   Bariatric surgery status 10/07/2021   Cellulitis of umbilicus 89/38/1017   Edema 10/07/2021   Essential tremor 10/07/2021   History of adenomatous polyp of colon 10/07/2021   Hypoalbuminemia 10/07/2021   Hypocalcemia 10/07/2021    Insomnia 10/07/2021   Internal hemorrhoids 10/07/2021   Kidney stone 10/07/2021   Low back pain 10/07/2021   Malnutrition ([place]) 10/07/2021   Osteoporosis 10/07/2021   Pathological fracture of vertebra 10/07/2021   Peptic ulcer disease 10/07/2021   Spontaneous ecchymosis 10/07/2021  Unilateral primary osteoarthritis, unspecified knee 05/06/2021   Acute exacerbation of chronic obstructive pulmonary disease (COPD) ([place]) 04/08/2021   History of anemia 04/08/2021   History of asthma 04/08/2021   History of COVID-19 04/08/2021   Shortness of breath 04/08/2021   Acute COVID-19 03/08/2021   Cough 03/08/2021   Fever 03/08/2021   Colon cancer screening 01/08/2021   Arthritis of right hand 11/15/2019   Age-related osteoporosis without current pathological fracture 10/30/2019   Left-sided weakness    Stroke-like symptoms 02/24/2018   HTN (hypertension) 02/24/2018   Primary osteoarthritis of both hands 11/03/2017   Dupuytren's contracture of left hand 11/03/2017   DDD (degenerative disc disease), lumbar 11/03/2017   History of gastric bypass 11/03/2017   Lymphedema 09/17/2017   History of vertebral fracture 09/17/2017   History of osteoporosis 09/17/2017   Iron deficiency anemia due to chronic blood loss 06/28/2017   Lower GI bleed 05/31/2017   Rectal mass 05/31/2017   Asthmatic bronchitis without complication 88/33/7445   GERD (gastroesophageal reflux disease) 05/31/2017   Closed fracture of upper end of humerus 09/23/2016   Tendinitis 09/23/2016   Patulous eustachian tube of right ear 11/07/2015   Superior semicircular canal dehiscence, 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 [place], [place], [address] Phone: [phone]   Fax:  [phone]  Name: Tiffany  Ryals Burns MRN: [id number] Date of Birth: 07-30-1951

## 2021-10-20 NOTE — Assessment & Plan Note (Addendum)
Significant improvement of asthma after gastric bypass surgery rarely requiring SABA. Obstruction on office spirometry from 2018. Some improvement in current symp with albuterol. FeNO today was nl. Eos on previous CBC 0.1. Will await formal PFTs to determine next steps as low suspicion this is the cause of her SOB.

## 2021-10-20 NOTE — Assessment & Plan Note (Signed)
Persistent DOE and orthopnea/PND. Suspect multifactorial r/t fluid overload, CHF, NSVT, and possible asthmatic component. Also questionable whether DOE or fatigue (new dx of hypothyroidism). No SOB during exam and without desaturation or difficulties during walking oximetry at previous visit. CXR today to evaluate status of pleural effusions. Still needs PFTs - scheduled today.   Patient Instructions  -Continue Albuterol inhaler 2 puffs every 6 hours as needed for shortness of breath or wheezing. Notify if symptoms persist despite rescue inhaler/neb use. -Continue lasix 20 mg daily. We may increase your dose after your labs come back  Chest x ray today. We will notify you of results  Labs today - BMET and BNP  Follow up after PFTs with Dr. Elsworth Soho or Alanson Aly. If symptoms do not improve or worsen, please contact office for sooner follow up or seek emergency care.

## 2021-10-20 NOTE — Patient Instructions (Addendum)
-  Continue Albuterol inhaler 2 puffs every 6 hours as needed for shortness of breath or wheezing. Notify if symptoms persist despite rescue inhaler/neb use. -Continue lasix 20 mg daily. We may increase your dose after your labs come back  Chest x ray today. We will notify you of results  Labs today - BMET and BNP  Follow up after PFTs with Dr. Elsworth Soho or Alanson Aly. If symptoms do not improve or worsen, please contact office for sooner follow up or seek emergency care.

## 2021-10-20 NOTE — Progress Notes (Signed)
Please have patient increase her lasix to 40 mg daily. Monitor BP at home and notify if <100/60. Have her start taking potassium chloride 20 mEq daily as her potassium is a little low and the lasix could make this worse. Her calcium is still low so I would advise her to follow up with her PCP regarding this. Her BNP (the lab we looked at to assess her heart function) was normal, which is good news. Needs to follow up with PCP or whoever manages her lymphedema since it's worse. Thanks!

## 2021-10-20 NOTE — Assessment & Plan Note (Addendum)
Minimal improvement in SOB s/p thora. Transudative fluid, likely cardiac or liver etiology. Recent LFTs normal. Small amount of ascites on [place]. Awaiting CT abd/pelvis results. CXR today slight increase in right pleural effusion; improved aeration compared to prior. Will await BMET and increase lasix.

## 2021-10-20 NOTE — Progress Notes (Signed)
@Patient  ID: [name], female    DOB: 11-12-1950, 71 y.o.   MRN: [id number]  Chief Complaint  Patient presents with   Follow-up    She reports she is some better with her shortness of breath, she has fluid on her stomach that is also hurting her breathing.     Referring provider: Bonnita Hollow, MD  HPI: 71 year old female, never smoker followed for pleural effusion and shortness of breath. She does have a history of asthma/bronchitis but had significant improvement in symptoms after gastric bypass surgery. She is a patient of Dr. Bari Mantis and last seen in office on 08/15/2021. Past medical history significant for gastric bypass 2003, malabsorption, secondary hyperparathyroidism, chronic BLE lymphedema, HTN, GERD, IDA, insomnia, SVT.   TEST/EVENTS:   08/15/2021: OV with Dr. Elsworth Soho. Consult for SOB. Awaiting workup by cards with recent decrease in EF 45-50% and BLE edema. Rx for lasix 20 mg x 5 days. VQ scan, although low suspicion for clots given length of time. VQ was neg. Small pleural effusion, suspected to be r/t HF. PFTs ordered.   10/13/2021: OV with cardiology. Started on dilt for NSVT runs. Stress test with low normal EF. Continued PO lasix. If persistent symptoms, could check BNP and increase lasix.   10/15/2021: Thoracentesis with 1 L straw colored fluid. Neg cytology and culture. Low protein and LDH consistent with trasudative - possibly r/t cardiac or liver etiologies.   10/20/2021: Today - follow up Patient presents today for follow up after thoracentesis on 10/15/2021. She reports her breathing is minimally improved. Her SOB is primarily upon exertion and also when she lays flat at night. She occasionally has a non-productive cough and some wheezing. She has been feeling somewhat fatigued and has had more swelling in her lower extremities. She reported that they found fluid on her abdomen and that she is being followed for hypothyroid but has not started any treatment. She had  a CT scan of her abdomen and pelvis on Friday but has not received the results yet. She denies chest pain, hemoptysis, fevers, or palpitations. She does note some improvement with her albuterol inhaler, which she uses once a day. She continues on lasix 20 mg daily. She never completed her PFTs which were previously ordered.   FeNO 6 ppb  Allergies  Allergen Reactions   Zoledronic Acid Other (See Comments)    Other reaction(s): fever and flu-like symptoms   Azithromycin Diarrhea    Other reaction(s): diarrhea   Lac Bovis Diarrhea   Prednisone Diarrhea   Sulfa Antibiotics Itching   Tape Dermatitis    Immunization History  Administered Date(s) Administered   DTaP / Hep B / IPV 02/21/2019, 08/29/2019, 03/14/2020, 10/15/2020, 05/01/2021    Past Medical History:  Diagnosis Date   Asthma    CHF (congestive heart failure) (HCC)    GERD (gastroesophageal reflux disease)    Internal hemorrhoids    Iron deficiency anemia    Osteoporosis    Peptic ulcer    Tubular adenoma of colon    Vertigo     Tobacco History: Social History   Tobacco Use  Smoking Status Never  Smokeless Tobacco Never   Counseling given: Not Answered   Outpatient Medications Prior to Visit  Medication Sig Dispense Refill   albuterol (VENTOLIN HFA) 108 (90 Base) MCG/ACT inhaler Inhale 2 puffs into the lungs in the morning, at noon, in the evening, and at bedtime.     calcitRIOL (ROCALTROL) 0.5 MCG capsule Take 0.5  mcg by mouth daily.  5   Cholecalciferol ([employer] D3) 125 MCG (5000 UT) capsule Take 1 tablet by mouth daily.     denosumab (PROLIA) 60 MG/ML SOSY injection Inject 60 mg into the skin every 6 (six) months.     diltiazem (CARDIZEM) 30 MG tablet Take 1 tablet (30 mg total) by mouth 2 (two) times daily. 60 tablet 2   furosemide (LASIX) 20 MG tablet Take 20 mg by mouth. Take 1 tablet by mouth daily .     Melatonin 10 MG TABS Take 1 tablet by mouth as needed.     Multiple Vitamin (M.V.I. ADULT IV) Take 1  tablet by mouth daily.     pantoprazole (PROTONIX) 20 MG tablet Take 1 tablet (20 mg total) by mouth daily. 90 tablet 3   Vitamin A 2400 MCG (8000 UT) CAPS Take 1 capsule by mouth daily.     Vitamin E 180 MG (400 UNIT) CAPS Take 1 capsule by mouth daily.     No facility-administered medications prior to visit.     Review of Systems:   Constitutional: No weight loss or gain, night sweats, fevers, chills. +fatigue. HEENT: No headaches, difficulty swallowing, tooth/dental problems, or sore throat. No sneezing, itching, ear ache, nasal congestion, or post nasal drip CV:  +orthopnea, PND, lower extremity swelling. No chest pain,  anasarca, dizziness, palpitations, syncope Resp: +shortness of breath with exertion; occasional non-productive cough; occasional wheeze. No excess mucus or change in color of mucus. No hemoptysis. No chest wall deformity GI:  No heartburn, indigestion, abdominal pain, nausea, vomiting, diarrhea, change in bowel habits, loss of appetite, bloody stools.  GU: No dysuria, change in color of urine, urgency or frequency.  No flank pain, no hematuria  Skin: No rash, lesions, ulcerations MSK:  No joint pain or swelling.  No decreased range of motion.  No back pain. Neuro: No dizziness or lightheadedness.  Psych: No depression or anxiety. Mood stable.     Physical Exam:  BP 110/72 (BP Location: Left Arm, Patient Position: Sitting, Cuff Size: Normal)    Pulse 86    Temp 98 F (36.7 C) (Oral)    Ht 5\' 4"  (1.626 m)    Wt 169 lb 6.4 oz (76.8 kg)    SpO2 99%    BMI 29.08 kg/m   GEN: Pleasant, interactive, well-appearing; in no acute distress. HEENT:  Normocephalic and atraumatic. EACs patent bilaterally. TM pearly gray with present light reflex bilaterally. PERRLA. Sclera white. Nasal turbinates pink, moist and patent bilaterally. No rhinorrhea present. Oropharynx pink and moist, without exudate or edema. No lesions, ulcerations, or postnasal drip.  NECK:  Supple w/ fair ROM.  No JVD present. Normal carotid impulses w/o bruits. Thyroid symmetrical with no goiter or nodules palpated. No lymphadenopathy.   CV: RRR, no m/r/g, BLE pitting edema. Pulses intact, +2 bilaterally. No cyanosis, pallor or clubbing. PULMONARY:  Unlabored, regular breathing. Clear bilaterally A&P w/o wheezes/rales/rhonchi. No accessory muscle use. No dullness to percussion. GI: BS present and normoactive. Soft, non-tender to palpation. No organomegaly or masses detected. No CVA tenderness. MSK: No erythema, warmth or tenderness. Cap refil <2 sec all extrem. No deformities or joint swelling noted.  Neuro: A/Ox3. No focal deficits noted.   Skin: Warm, no lesions or rashe Psych: Normal affect and behavior. Judgement and thought content appropriate.     Lab Results:  CBC    Component Value Date/Time   WBC 6.5 05/29/2021 1251   RBC 3.74 (L) 05/29/2021 1251  HGB 12.1 05/29/2021 1251   HGB 10.8 (L) 09/25/2016 1518   HCT 38.1 05/29/2021 1251   HCT 32.6 (L) 09/25/2016 1518   PLT 191 05/29/2021 1251   PLT 264 09/25/2016 1518   MCV 101.9 (H) 05/29/2021 1251   MCV 96 09/25/2016 1518   MCH 32.4 05/29/2021 1251   MCHC 31.8 05/29/2021 1251   RDW 13.9 05/29/2021 1251   RDW 15.5 (H) 09/25/2016 1518   LYMPHSABS 1.6 05/29/2021 1251   LYMPHSABS 1.2 09/25/2016 1518   MONOABS 0.4 05/29/2021 1251   EOSABS 0.1 05/29/2021 1251   EOSABS 0.1 09/25/2016 1518   BASOSABS 0.0 05/29/2021 1251   BASOSABS 0.0 09/25/2016 1518    BMET    Component Value Date/Time   NA 142 10/07/2021 1456   K 3.9 10/07/2021 1456   CL 109 (H) 10/07/2021 1456   CO2 21 10/07/2021 1456   GLUCOSE 78 10/07/2021 1456   GLUCOSE 77 05/29/2021 1251   BUN 7 (L) 10/07/2021 1456   CREATININE 0.53 (L) 10/07/2021 1456   CALCIUM 6.7 (LL) 10/07/2021 1456   GFRNONAA >60 05/29/2021 1251   GFRAA >60 05/02/2020 1308    BNP    Component Value Date/Time   BNP 62.2 09/25/2016 1518     Imaging:  DG Chest 1 View  Result Date:  10/15/2021 CLINICAL DATA:  Post thoracentesis.  RIGHT side. EXAM: CHEST  1 VIEW COMPARISON:  IR ultrasound, earlier same day.  Chest XR, 10/09/2021. FINDINGS: Cardiomediastinal silhouette is within normal limits given technique and patient positioning. Lungs are hypoinflated. No focal consolidation or mass. Trace fluid layering along the minor fissure, with miniscule residual pleural effusion. No pneumothorax. Cholecystectomy clips. T12 vertebral body augmentation. No acute displaced fracture. IMPRESSION: No postprocedure pneumothorax. Electronically Signed   By: Michaelle Birks M.D.   On: 10/15/2021 11:46   DG Chest 2 View  Result Date: 10/20/2021 CLINICAL DATA:  Shortness of breath, pleural effusion. EXAM: CHEST - 2 VIEW COMPARISON:  Multiple prior studies most recent October 15, 2021. FINDINGS: Slightly improved aeration when compared to the prior study and slight rotation to the RIGHT. Accounting for this cardiomediastinal contours and hilar structures are stable. Slightly increased density at the RIGHT lung base compared to the most recent prior with small effusion in the posterior RIGHT chest which is likely increased compared to the recent post thoracentesis radiograph. No visible pneumothorax. Skin fold and redundant axillary soft tissues on the LEFT. No lobar consolidation. Signs of cement augmentation of the L1 vertebral level. On limited assessment there is no acute skeletal process. IMPRESSION: 1. Slight increase in RIGHT pleural effusion likely since recent thoracentesis. The volume in the RIGHT chest is slightly more also than seen on August 21, 2021 and is likely associated with concomitant volume loss. 2. Improved aeration when compared to the prior study. Electronically Signed   By: Zetta Bills M.D.   On: 10/20/2021 10:30   DG Chest 2 View  Result Date: 10/13/2021 CLINICAL DATA:  71 year old female with pleural effusion EXAM: CHEST - 2 VIEW COMPARISON:  08/21/2021 FINDINGS: Cardiomediastinal  silhouette likely unchanged with a right heart border partially obscured by overlying lung/pleural disease. Opacity at the right lung base, similar to that on the chest x-ray 08/21/2021, with associated meniscus on the lateral view. Right heart border and the right hemidiaphragm are obscured. No pneumothorax. Left lung relatively well aerated. Degenerative changes the spine.  No acute displaced fracture. IMPRESSION: Opacity at the right lung base, likely a combination of pleural effusion  and associated atelectasis/consolidation. Appearance similar to that on the chest x-ray 08/21/2021. Electronically Signed   By: Corrie Mckusick D.O.   On: 10/13/2021 15:58   US Abdomen Complete  Result Date: 10/03/2021 CLINICAL DATA:  Elevated liver function tests. Prior cholecystectomy. EXAM: ABDOMEN ULTRASOUND COMPLETE COMPARISON:  None. FINDINGS: Gallbladder: The gallbladder is surgically absent. Common bile duct: Diameter: 7.7 mm Liver: No focal lesion identified. Diffusely increased echogenicity of the liver parenchyma is noted. Portal vein is patent on color Doppler imaging with normal direction of blood flow towards the liver. IVC: No abnormality visualized. Pancreas: Visualized portion unremarkable. Spleen: Size (4.9 cm) and appearance within normal limits. Right Kidney: Length: 10.1 cm. Echogenicity within normal limits. A 1.0 cm shadowing echogenic focus is seen within the right kidney. Parapelvic renal cysts versus pelvic fullness is seen. Left Kidney: Length: 9.7 cm. Echogenicity within normal limits. Parapelvic renal cyst versus pelvic fullness is seen. Abdominal aorta: No aneurysm visualized (2.6 cm). Other findings: There is a small amount of ascites. Bilateral pleural effusions are noted, right larger than left. IMPRESSION: 1. Evidence of prior cholecystectomy. 2. Hepatic steatosis without focal liver lesions. 3. Bilateral pleural effusions, right greater than left. 4. Small amount of ascites. Electronically Signed    By: Virgina Norfolk M.D.   On: 10/03/2021 19:42   US THORACENTESIS ASP PLEURAL SPACE W/IMG GUIDE  Result Date: 10/15/2021 INDICATION: Right pleural effusion EXAM: ULTRASOUND GUIDED RIGHT THORACENTESIS MEDICATIONS: 10 mL of 1% lidocaine. COMPLICATIONS: None immediate. PROCEDURE: An ultrasound guided thoracentesis was thoroughly discussed with the patient and questions answered. The benefits, risks, alternatives and complications were also discussed. The patient understands and wishes to proceed with the procedure. Written consent was obtained. Ultrasound was performed to localize and mark an adequate pocket of fluid in the RIGHT chest. The area was then prepped and draped in the normal sterile fashion. 1% Lidocaine was used for local anesthesia. Under ultrasound guidance a Yueh catheter was introduced. Thoracentesis was performed. The catheter was removed and a dressing applied. FINDINGS: A total of approximately 1 liter of straw colored fluid was removed. Samples were sent to the laboratory as requested by the clinical team. IMPRESSION: Successful ultrasound guided RIGHT thoracentesis yielding 1 liter of pleural fluid. Read by Lavonia Drafts [facility] Electronically Signed   By: Michaelle Birks M.D.   On: 10/15/2021 12:31      No flowsheet data found.  Lab Results  Component Value Date   NITRICOXIDE 6 10/20/2021        Assessment & Plan:   Shortness of breath Persistent DOE and orthopnea/PND. Suspect multifactorial r/t fluid overload, CHF, NSVT, and possible asthmatic component. Also questionable whether DOE or fatigue (new dx of hypothyroidism). No SOB during exam and without desaturation or difficulties during walking oximetry at previous visit. CXR today to evaluate status of pleural effusions. Still needs PFTs - scheduled today.   Patient Instructions  -Continue Albuterol inhaler 2 puffs every 6 hours as needed for shortness of breath or wheezing. Notify if symptoms persist despite rescue  inhaler/neb use. -Continue lasix 20 mg daily. We may increase your dose after your labs come back  Chest x ray today. We will notify you of results  Labs today - BMET and BNP  Follow up after PFTs with Dr. Elsworth Soho or Alanson Aly. If symptoms do not improve or worsen, please contact office for sooner follow up or seek emergency care.   Pleural effusion Minimal improvement in SOB s/p thora. Transudative fluid, likely cardiac or  liver etiology. Recent LFTs normal. Small amount of ascites on [place]. Awaiting CT abd/pelvis results. CXR today slight increase in right pleural effusion; improved aeration compared to prior. Will await BMET and increase lasix.   Chronic heart failure (HCC) EF on echo 45-50%, G1DD. Preserved EF on stress test. BNP today for further evaluation. Will obtain BMET as well and likely increase lasix.   NSVT (nonsustained ventricular tachycardia) Pt has not noticed a significant improvement in symptoms since starting dilt. No recent palpitations. Will need to f/u with cardiology as scheduled.   Asthmatic bronchitis without complication Significant improvement of asthma after gastric bypass surgery rarely requiring SABA. Obstruction on office spirometry from 2018. Some improvement in current symp with albuterol. FeNO today was nl. Eos on previous CBC 0.1. Will await formal PFTs to determine next steps as low suspicion this is the cause of her SOB.   Lymphedema Worsening BLE lymphedema. Ongoing issues since 2019. Previously followed by Dr. Estanislado Pandy - may need to return. Will await BMET and increase lasix.      Clayton Bibles, NP 10/20/2021  Pt aware and understands NP's role.

## 2021-10-21 ENCOUNTER — Encounter: Payer: Self-pay | Admitting: Gastroenterology

## 2021-10-21 DIAGNOSIS — R188 Other ascites: Secondary | ICD-10-CM

## 2021-10-21 LAB — CULTURE, BODY FLUID W GRAM STAIN -BOTTLE
Culture: NO GROWTH
Special Requests: ADEQUATE

## 2021-10-21 NOTE — Telephone Encounter (Signed)
[name] - urgent referral to [facility] for evaluation ? Hepatic hydrothorax - evaluate for TIPS/liver transplant

## 2021-10-22 ENCOUNTER — Telehealth (HOSPITAL_COMMUNITY): Payer: Self-pay | Admitting: Physical Therapy

## 2021-10-22 ENCOUNTER — Ambulatory Visit (HOSPITAL_COMMUNITY): Payer: Medicare Other | Admitting: Physical Therapy

## 2021-10-22 NOTE — Telephone Encounter (Signed)
Pt with increased fluid in her abdomen and reports discomfort and difficulty breathing.  Pt requests and therapist in agreement to t herapy to be held this week until fluid issue is resolved.  MLD and bandaging of LE would only push more fluid into abdominal cavity as fluid is not evacuating at this time.   Therapist also instructed pt to hold on using compression pump this week as well.  Pt to keep therapist updated with plans to return next week.  Teena Irani, PTA/CLT, Lissa Morales [phone]

## 2021-10-24 ENCOUNTER — Telehealth: Payer: Self-pay

## 2021-10-24 ENCOUNTER — Encounter (HOSPITAL_COMMUNITY): Payer: Medicare Other | Admitting: Physical Therapy

## 2021-10-24 MED ORDER — ALBUMIN HUMAN 25 % IV SOLN: 25.0000 g | Freq: Once | INTRAVENOUS | Status: AC

## 2021-10-24 MED ORDER — ALBUMIN HUMAN 25 % IV SOLN: 25.0000 g | Freq: Once | INTRAVENOUS | Status: DC

## 2021-10-24 NOTE — Addendum Note (Signed)
Addended by: Wayna Chalet on: 10/24/2021 11:00 AM   Modules accepted: Orders

## 2021-10-24 NOTE — Telephone Encounter (Signed)
Called patient to let hr know that her Paracentesis was scheduled to be done on 10/27/2021 at 9:00 AM at the [facility]. Patient agreed and had no further questions.

## 2021-10-27 ENCOUNTER — Other Ambulatory Visit: Payer: Self-pay

## 2021-10-27 ENCOUNTER — Ambulatory Visit
Admission: RE | Admit: 2021-10-27 | Discharge: 2021-10-27 | Disposition: A | Discharge status: Home / Self Care | Payer: Medicare Other | Source: Ambulatory Visit | Attending: Gastroenterology | Admitting: Gastroenterology

## 2021-10-27 ENCOUNTER — Encounter (HOSPITAL_COMMUNITY): Payer: Medicare Other | Admitting: Physical Therapy

## 2021-10-27 ENCOUNTER — Other Ambulatory Visit: Payer: Self-pay | Admitting: Gastroenterology

## 2021-10-27 DIAGNOSIS — R188 Other ascites: Secondary | ICD-10-CM | POA: Insufficient documentation

## 2021-10-27 NOTE — Progress Notes (Signed)
Pt was brought to [place] for paracentesis. Upon US examination, there was only trace fluid noted with no safe window of access.  Exam was ended and explained to patient.  Patient was in agreement with findings.       Narda Rutherford, AGNP-BC 10/27/2021, 10:24 AM

## 2021-10-27 NOTE — Telephone Encounter (Signed)
[name] ensure per patients message ascites sent again for cell count, microscopy, culture and gram stain . Inform l;ast time checked no cancer cells seen 2 weeks back

## 2021-10-29 ENCOUNTER — Encounter (HOSPITAL_COMMUNITY): Payer: Medicare Other | Admitting: Physical Therapy

## 2021-10-29 ENCOUNTER — Other Ambulatory Visit: Payer: Self-pay

## 2021-10-29 DIAGNOSIS — E039 Hypothyroidism, unspecified: Secondary | ICD-10-CM | POA: Insufficient documentation

## 2021-10-30 ENCOUNTER — Telehealth (INDEPENDENT_AMBULATORY_CARE_PROVIDER_SITE_OTHER): Payer: Medicare Other | Admitting: Gastroenterology

## 2021-10-30 DIAGNOSIS — R188 Other ascites: Secondary | ICD-10-CM

## 2021-10-30 DIAGNOSIS — E8809 Other disorders of plasma-protein metabolism, not elsewhere classified: Secondary | ICD-10-CM | POA: Diagnosis not present

## 2021-10-30 NOTE — Progress Notes (Addendum)
Jonathon Bellows MD, MRCP(U.K) [address]  [place], [place] [address]  Main: [phone]  Fax: [phone]   Primary Care Physician: [facility]  Primary Gastroenterologist:  Dr. Jonathon Bellows   Chief Complaint  Patient presents with   Hypoalbuminemia    HPI: Rosanna Ryals [place] is a 71 y.o. female  Summary of history :   Initially referred and seen on 01/08/2021 for vomiting after eating.  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 2019 she was seen by [facility] and underwent hemorrhoidal banding.  .Does complain of heartburn despite being on famotidine.  Previously was on pantoprazole.  He says it was stopped due to concern about long-term usage.   Seen by the cancer center for iron deficiency anemia in March 2022 hemoglobin 13.1 g has received IV iron.   01/08/2021: Tested positive H. pylori.  Treated with quadruple based bismuth regimen. 02/04/2021: Hemoglobin 13.5 g, ferritin 213, B12 normal, folate normal, CMP normal 01/28/2021: EGD: Features of gastric bypass seen otherwise procedure was normal.  Colonoscopy also performed on the same day.  2 sessile polyps 10 to 12 mm were resected colon there were tubular adenomas repeat colonoscopy in 3 years  03/19/2021: H. pylori breath test negative  05/29/2021: Hemoglobin 12.1 g.  Albumin 2.5, ALT 30, AST 31 10/03/2021: Ultrasound abdomen showed small amount of ascites bilateral pleural effusions.  08/06/2021 echo shows normal study normal LV function    Interval history 10/07/2021-10/30/2021  Since her last visit she was referred to an endocrinologist who has placed her on thyroxine and since then she states that she is feeling much better in terms of strength muscle power appetite.  She has a follow-up visit with them.  We have referred her to [facility] for a second opinion and is awaiting an appointment.  On 10/27/2021 she had an  ultrasound abdomen which showed trace amount of intra-abdominal ascites.  She is also being followed by a pulmonologist and is on Lasix.      Allergies as of 10/30/2021 - Review Complete 10/20/2021  Allergen Reaction Noted   Zoledronic acid Other (See Comments) 09/02/2021   Azithromycin Diarrhea 08/15/2014   Lac bovis Diarrhea 08/15/2014   Prednisone Diarrhea 08/15/2014   Sulfa antibiotics Itching 08/15/2014   Tape Dermatitis 02/24/2018    ROS:  General: Negative for anorexia, weight loss, fever, chills, fatigue, weakness. ENT: Negative for hoarseness, difficulty swallowing , nasal congestion. CV: Negative for chest pain, angina, palpitations, dyspnea on exertion, peripheral edema.  Respiratory: Negative for dyspnea at rest, dyspnea on exertion, cough, sputum, wheezing.  GI: See history of present illness. GU:  Negative for dysuria, hematuria, urinary incontinence, urinary frequency, nocturnal urination.  Endo: Negative for unusual weight change.       Imaging Studies: DG Chest 1 View  Result Date: 10/15/2021 CLINICAL DATA:  Post thoracentesis.  RIGHT side. EXAM: CHEST  1 VIEW COMPARISON:  IR ultrasound, earlier same day.  Chest XR, 10/09/2021. FINDINGS: Cardiomediastinal silhouette is within normal limits given technique and patient positioning. Lungs are hypoinflated. No focal consolidation or mass. Trace fluid layering along the minor fissure, with miniscule residual pleural effusion. No pneumothorax. Cholecystectomy clips. T12 vertebral body augmentation. No acute displaced fracture. IMPRESSION: No postprocedure pneumothorax. Electronically Signed   By: Michaelle Birks M.D.   On: 10/15/2021 11:46   DG Chest 2 View  Result Date: 10/20/2021 CLINICAL DATA:  Shortness of breath, pleural effusion.  EXAM: CHEST - 2 VIEW COMPARISON:  Multiple prior studies most recent October 15, 2021. FINDINGS: Slightly improved aeration when compared to the prior study and slight rotation to the RIGHT.  Accounting for this cardiomediastinal contours and hilar structures are stable. Slightly increased density at the RIGHT lung base compared to the most recent prior with small effusion in the posterior RIGHT chest which is likely increased compared to the recent post thoracentesis radiograph. No visible pneumothorax. Skin fold and redundant axillary soft tissues on the LEFT. No lobar consolidation. Signs of cement augmentation of the L1 vertebral level. On limited assessment there is no acute skeletal process. IMPRESSION: 1. Slight increase in RIGHT pleural effusion likely since recent thoracentesis. The volume in the RIGHT chest is slightly more also than seen on August 21, 2021 and is likely associated with concomitant volume loss. 2. Improved aeration when compared to the prior study. Electronically Signed   By: Zetta Bills M.D.   On: 10/20/2021 10:30   DG Chest 2 View  Result Date: 10/13/2021 CLINICAL DATA:  71 year old female with pleural effusion EXAM: CHEST - 2 VIEW COMPARISON:  08/21/2021 FINDINGS: Cardiomediastinal silhouette likely unchanged with a right heart border partially obscured by overlying lung/pleural disease. Opacity at the right lung base, similar to that on the chest x-ray 08/21/2021, with associated meniscus on the lateral view. Right heart border and the right hemidiaphragm are obscured. No pneumothorax. Left lung relatively well aerated. Degenerative changes the spine.  No acute displaced fracture. IMPRESSION: Opacity at the right lung base, likely a combination of pleural effusion and associated atelectasis/consolidation. Appearance similar to that on the chest x-ray 08/21/2021. Electronically Signed   By: Corrie Mckusick D.O.   On: 10/13/2021 15:58   US Abdomen Complete  Result Date: 10/03/2021 CLINICAL DATA:  Elevated liver function tests. Prior cholecystectomy. EXAM: ABDOMEN ULTRASOUND COMPLETE COMPARISON:  None. FINDINGS: Gallbladder: The gallbladder is surgically absent.  Common bile duct: Diameter: 7.7 mm Liver: No focal lesion identified. Diffusely increased echogenicity of the liver parenchyma is noted. Portal vein is patent on color Doppler imaging with normal direction of blood flow towards the liver. IVC: No abnormality visualized. Pancreas: Visualized portion unremarkable. Spleen: Size (4.9 cm) and appearance within normal limits. Right Kidney: Length: 10.1 cm. Echogenicity within normal limits. A 1.0 cm shadowing echogenic focus is seen within the right kidney. Parapelvic renal cysts versus pelvic fullness is seen. Left Kidney: Length: 9.7 cm. Echogenicity within normal limits. Parapelvic renal cyst versus pelvic fullness is seen. Abdominal aorta: No aneurysm visualized (2.6 cm). Other findings: There is a small amount of ascites. Bilateral pleural effusions are noted, right larger than left. IMPRESSION: 1. Evidence of prior cholecystectomy. 2. Hepatic steatosis without focal liver lesions. 3. Bilateral pleural effusions, right greater than left. 4. Small amount of ascites. Electronically Signed   By: Virgina Norfolk M.D.   On: 10/03/2021 19:42   US Abdomen Limited  Result Date: 10/27/2021 CLINICAL DATA:  Evaluate for intra-abdominal ascites. EXAM: LIMITED ABDOMEN ULTRASOUND FOR ASCITES TECHNIQUE: Limited ultrasound survey for ascites was performed in all four abdominal quadrants. COMPARISON:  None. FINDINGS: Sonographic evaluation of the abdomen demonstrates a trace amount of intra-abdominal ascites, too small to allow for safe ultrasound-guided paracentesis. IMPRESSION: Trace amount of intra-abdominal ascites, too small to allow for safe ultrasound-guided paracentesis. Electronically Signed   By: Sandi Mariscal M.D.   On: 10/27/2021 10:15   US THORACENTESIS ASP PLEURAL SPACE W/IMG GUIDE  Result Date: 10/15/2021 INDICATION: Right pleural effusion EXAM: ULTRASOUND GUIDED  RIGHT THORACENTESIS MEDICATIONS: 10 mL of 1% lidocaine. COMPLICATIONS: None immediate. PROCEDURE: An  ultrasound guided thoracentesis was thoroughly discussed with the patient and questions answered. The benefits, risks, alternatives and complications were also discussed. The patient understands and wishes to proceed with the procedure. Written consent was obtained. Ultrasound was performed to localize and mark an adequate pocket of fluid in the RIGHT chest. The area was then prepped and draped in the normal sterile fashion. 1% Lidocaine was used for local anesthesia. Under ultrasound guidance a Yueh catheter was introduced. Thoracentesis was performed. The catheter was removed and a dressing applied. FINDINGS: A total of approximately 1 liter of straw colored fluid was removed. Samples were sent to the laboratory as requested by the clinical team. IMPRESSION: Successful ultrasound guided RIGHT thoracentesis yielding 1 liter of pleural fluid. Read by Lavonia Drafts [facility] Electronically Signed   By: Michaelle Birks M.D.   On: 10/15/2021 12:31    Assessment and Plan:   Klarissa Ryals [place] is a 71 y.o. y/o female with a past medical history of Roux-en-Y gastric bypass, H. pylori infection, status posttreatment.  Prior history of nausea vomiting resolved with commencement of pantoprazole .   EGD did not demonstrate any anastomotic ulcers.  At her last visit and noticed that she had a very low serum albumin.  Very minimal abdominal ascites but she has had pleural effusions.  Low albumin in the ascites fluid.  The degree of her pleural effusion is disproportionate to her ascites which makes it less likely to develop a hepatic hydrothorax.  She was found to have very low t T3 levels and high TSH.  Hypothyroidism may contribute to this.  I have referred her to [facility] for second opinion she is having her thyroxine supplemented and she is feeling much better clinically.  We are to wait and see if normalizing her thyroid function will resolve the pleural effusions and ascites.  Transaminases, total bilirubin were normal on  10/07/2021.  I will have my staff to see if we can get her appointment at [facility] any sooner.      Dr Jonathon Bellows  MD,MRCP [facility]) Follow up in 4 to 6 weeks    Addendum :   This was a video visit .   Dr Jonathon Bellows MD,MRCP [facility]) Gastroenterology/Hepatology Pager: [phone]

## 2021-10-31 ENCOUNTER — Encounter (HOSPITAL_COMMUNITY): Payer: Medicare Other | Admitting: Physical Therapy

## 2021-11-01 LAB — T4, FREE: Free T4: 1.09 ng/dL (ref 0.82–1.77)

## 2021-11-01 LAB — T3, FREE: T3, Free: 1.9 pg/mL — ABNORMAL LOW (ref 2.0–4.4)

## 2021-11-01 LAB — SPECIMEN STATUS REPORT

## 2021-11-03 ENCOUNTER — Other Ambulatory Visit: Payer: Self-pay

## 2021-11-03 ENCOUNTER — Ambulatory Visit (HOSPITAL_COMMUNITY): Payer: Medicare Other | Admitting: Physical Therapy

## 2021-11-03 DIAGNOSIS — I890 Lymphedema, not elsewhere classified: Secondary | ICD-10-CM | POA: Diagnosis not present

## 2021-11-03 DIAGNOSIS — R262 Difficulty in walking, not elsewhere classified: Secondary | ICD-10-CM

## 2021-11-03 NOTE — Therapy (Addendum)
[place], [place], [address] Phone: [phone]   Fax:  [phone]  Physical Therapy Treatment  Patient Details  Name: [name] MRN: [id number] Date of Birth: 1950-09-24 Referring Provider (PT): Stana Bunting   Encounter Date: 11/03/2021   PT End of Session - 11/03/21 1457     Visit Number 13    Number of Visits 28    Date for PT Re-Evaluation 12/15/21    Authorization Type [employer] medicare    Progress Note Due on Visit 20    PT Start Time 1318    PT Stop Time 1438    PT Time Calculation (min) 80 min    Activity Tolerance Patient tolerated treatment well    Behavior During Therapy [facility] for tasks assessed/performed             Past Medical History:  Diagnosis Date   Asthma    CHF (congestive heart failure) (HCC)    GERD (gastroesophageal reflux disease)    Internal hemorrhoids    Iron deficiency anemia    Osteoporosis    Peptic ulcer    Tubular adenoma of colon    Vertigo     Past Surgical History:  Procedure Laterality Date   BACK SURGERY     BREAST SURGERY     CHOLECYSTECTOMY     COLONOSCOPY WITH PROPOFOL N/A 06/02/2017   Procedure: COLONOSCOPY WITH PROPOFOL;  Surgeon: Jonathon Bellows, MD;  Location: [facility] ENDOSCOPY;  Service: Gastroenterology;  Laterality: N/A;   COLONOSCOPY WITH PROPOFOL N/A 01/28/2021   Procedure: COLONOSCOPY WITH PROPOFOL;  Surgeon: Jonathon Bellows, MD;  Location: [facility] ENDOSCOPY;  Service: Gastroenterology;  Laterality: N/A;   ESOPHAGOGASTRODUODENOSCOPY (EGD) WITH PROPOFOL N/A 07/01/2017   Procedure: ESOPHAGOGASTRODUODENOSCOPY (EGD) WITH PROPOFOL;  Surgeon: Jonathon Bellows, MD;  Location: [facility] ENDOSCOPY;  Service: Gastroenterology;  Laterality: N/A;   ESOPHAGOGASTRODUODENOSCOPY (EGD) WITH PROPOFOL N/A 01/28/2021   Procedure: ESOPHAGOGASTRODUODENOSCOPY (EGD) WITH PROPOFOL;  Surgeon: Jonathon Bellows, MD;  Location: [facility] ENDOSCOPY;  Service: Gastroenterology;  Laterality: N/A;    FLEXIBLE SIGMOIDOSCOPY N/A 07/01/2017   Procedure: FLEXIBLE SIGMOIDOSCOPY;  Surgeon: Jonathon Bellows, MD;  Location: [facility] ENDOSCOPY;  Service: Gastroenterology;  Laterality: N/A;   KNEE SURGERY     mini gastric bypass     SHOULDER SURGERY     TONSILLECTOMY      There were no vitals filed for this visit.   Subjective Assessment - 11/03/21 1446     Subjective pt states she went to the lymphedema specialist at [facility] and they are referring her to a lymphatic surgeon.  STates they recommended she continue the therapy here.  No pain or issues today but returns to liver dr tomorrow.  States she's been wearing her juxtas some but does not have a foot or thigh piece so her swelling is up today.    Currently in Pain? No/denies                   LYMPHEDEMA/ONCOLOGY QUESTIONNAIRE - 11/03/21 1449       Right Lower Extremity Lymphedema   20 cm Proximal to Suprapatella 61 cm   initial eval 1/11 was: 61   10 cm Proximal to Suprapatella 53.2 cm   initial eval 1/11 was: 54   At Midpatella/Popliteal Crease 46.5 cm   initial eval 1/11 was: 46.8   30 cm Proximal to Floor at Lateral Plantar Foot 45 cm   initial eval 1/11 was: 46   20 cm Proximal to Floor at  Lateral Plantar Foot 45 1   initial eval 1/11 was: 43.2   10 cm Proximal to Floor at Lateral Malleoli 32.8 cm   initial eval 1/11 was: 30.5   Circumference of ankle/heel 34.5 cm.   initial eval 1/11 was: 34.5   5 cm Proximal to 1st MTP Joint 23 cm   initial eval 1/11 was: 22.5   Across MTP Joint 23 cm   initial eval 1/11 was: 23.5     Left Lower Extremity Lymphedema   20 cm Proximal to Suprapatella 58 cm   initial eval 1/11 was: 59   10 cm Proximal to Suprapatella 57 cm   initial eval 1/11 was: 56.2   At Midpatella/Popliteal Crease 46.4 cm   initial eval 1/11 was: 46.4   30 cm Proximal to Floor at Lateral Plantar Foot 47 cm   initial eval 1/11 was: 46.8   20 cm Proximal to Floor at Lateral Plantar Foot 47 cm   initial eval 1/11 was: 43.4   10  cm Proximal to Floor at Lateral Malleoli 34 cm   initial eval 1/11 was: 32.8   Circumference of ankle/heel 35.5 cm.   initial eval 1/11 was: 36.2   5 cm Proximal to 1st MTP Joint 24.4 cm   initial eval 1/11 was: 22.3   Across MTP Joint 23 cm   initial eval 1/11 was: 22.8                       [facility] Adult PT Treatment/Exercise - 11/03/21 0001       Manual Therapy   Manual Therapy Compression Bandaging;Manual Lymphatic Drainage (MLD);Other (comment)    Manual therapy comments completed seperate from all other aspects    Manual Lymphatic Drainage (MLD) to include supraclavicular, deep and superfical abdominal, inguinal axillary anastomosis followed by B LEboth anterior and posteriorly    Compression Bandaging multilayer short stretch bandages with 1/2" foam from foot to thighB    Other Manual Therapy measurements                       PT Short Term Goals - 11/03/21 1450       PT SHORT TERM GOAL #1   Title Pt to be I in self manual techniques.    Time 2    Period Weeks    Status On-going    Target Date 10/08/21      PT SHORT TERM GOAL #2   Title PT to have decreased 1.5cm in thigh and leg,    Time 2    Period Weeks    Status On-going               PT Long Term Goals - 11/03/21 1450       PT LONG TERM GOAL #1   Title PT to have ankle foot wrap, LE and thigh juxtafit and be able to Timmothy Sours I    Time 5    Period Weeks    Status On-going    Target Date 10/29/21      PT LONG TERM GOAL #2   Title PT to have lost between 3 cm from thigh and leg measurements on average,    Time 5    Period Weeks    Status On-going      PT LONG TERM GOAL #3   Title PT pain to be no greater than a 6/10 in LE    Time 5    Period Weeks  Status On-going                   Plan - 11/03/21 1451     Clinical Impression Statement Pt returns today following 2 weeks of absence due to a myriad of health issues/MD appts.  Pt now facing liver failure and is  getting further testing on her lymphatic system and reason for her increasing fluid accumulation.  Measured today with overall increase in edema and induration in bilateral LEs.  Noted blanching at bottom near ankles when standing/walking and skin pushing into adjacent areas.  Pt also reports some numbness/tingling in feet due to increased induration/edema.  Pt instructed to remove dressings if numbness increases or shortness of breath.  Pt verbalized understanding.  Pt will continue to benefit from continued lymphatic therapy in order to reduce edema and acquire full juxtafit wraps (toes to thigh) as she currently only has the distal LE wraps without feet/ankle.    Personal Factors and Comorbidities Comorbidity 2;Comorbidity 3+;Fitness;Time since onset of injury/illness/exacerbation    Comorbidities gastric bypass followed by plastic surgery to remove excessive skin as pt lost over 250#, OA,    Examination-Activity Limitations Dressing;Locomotion Level;Stand    Examination-Participation Restrictions Cleaning;Community Activity;Shop;Meal Prep    Stability/Clinical Decision Making Evolving/Moderate complexity    Rehab Potential Good    PT Frequency 3x / week    PT Duration 6 weeks    PT Treatment/Interventions Manual techniques;Therapeutic activities;Patient/family education;Manual lymph drainage;Therapeutic exercise    PT Next Visit Plan Continue with complete lymphatic therapy.  Measure weekly (Wednesdays).    PT Home Exercise Plan ankle pump, LAQ, hip ab/adduction, march, diaphragmic breathing and lymphatic squeeze.; self manual    Consulted and Agree with Plan of Care Patient             Patient will benefit from skilled therapeutic intervention in order to improve the following deficits and impairments:  Pain, Difficulty walking, Increased edema  Visit Diagnosis: Lymphedema, not elsewhere classified  Difficulty in walking, not elsewhere classified     Problem List Patient Active  Problem List   Diagnosis Date Noted   Hypothyroidism 10/29/2021   Pleural effusion 10/20/2021   Chronic heart failure ([place]) 10/20/2021   NSVT (nonsustained ventricular tachycardia) 10/20/2021   Bariatric surgery status 10/07/2021   Cellulitis of umbilicus 40/81/4481   Edema 10/07/2021   Essential tremor 10/07/2021   History of adenomatous polyp of colon 10/07/2021   Hypoalbuminemia 10/07/2021   Hypocalcemia 10/07/2021   Insomnia 10/07/2021   Internal hemorrhoids 10/07/2021   Kidney stone 10/07/2021   Low back pain 10/07/2021   Malnutrition ([place]) 10/07/2021   Osteoporosis 10/07/2021   Pathological fracture of vertebra 10/07/2021   Peptic ulcer disease 10/07/2021   Spontaneous ecchymosis 10/07/2021   Unilateral primary osteoarthritis, unspecified knee 05/06/2021   Acute exacerbation of chronic obstructive pulmonary disease (COPD) ([place]) 04/08/2021   History of anemia 04/08/2021   History of asthma 04/08/2021   History of COVID-19 04/08/2021   Shortness of breath 04/08/2021   Acute COVID-19 03/08/2021   Cough 03/08/2021   Fever 03/08/2021   Colon cancer screening 01/08/2021   Arthritis of right hand 11/15/2019   Age-related osteoporosis without current pathological fracture 10/30/2019   Left-sided weakness    Stroke-like symptoms 02/24/2018   HTN (hypertension) 02/24/2018   Primary osteoarthritis of both hands 11/03/2017   Dupuytren's contracture of left hand 11/03/2017   DDD (degenerative disc disease), lumbar 11/03/2017   History of gastric bypass 11/03/2017   Lymphedema 09/17/2017  History of vertebral fracture 09/17/2017   History of osteoporosis 09/17/2017   Iron deficiency anemia due to chronic blood loss 06/28/2017   Lower GI bleed 05/31/2017   Rectal mass 05/31/2017   Asthmatic bronchitis without complication 43/88/8757   GERD (gastroesophageal reflux disease) 05/31/2017   Closed fracture of upper end of humerus 09/23/2016   Tendinitis 09/23/2016   Patulous  eustachian tube of right ear 11/07/2015   Superior semicircular canal dehiscence, 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 [place], [place], [address] Phone: [phone]   Fax:  [phone]  Name: [name] MRN: [id number] Date of Birth: 11-Nov-1950

## 2021-11-04 NOTE — Addendum Note (Signed)
Addended by: Leeroy Cha on: 11/04/2021 11:02 AM   Modules accepted: Orders

## 2021-11-05 ENCOUNTER — Encounter (HOSPITAL_COMMUNITY): Payer: Self-pay | Admitting: Physical Therapy

## 2021-11-05 ENCOUNTER — Other Ambulatory Visit: Payer: Self-pay

## 2021-11-05 ENCOUNTER — Encounter: Payer: Self-pay | Admitting: Student

## 2021-11-05 ENCOUNTER — Ambulatory Visit (HOSPITAL_COMMUNITY): Payer: Medicare Other | Admitting: Physical Therapy

## 2021-11-05 ENCOUNTER — Other Ambulatory Visit (HOSPITAL_COMMUNITY)
Admission: RE | Admit: 2021-11-05 | Discharge: 2021-11-05 | Disposition: A | Discharge status: Home / Self Care | Payer: Medicare Other | Source: Ambulatory Visit | Attending: Student | Admitting: Student

## 2021-11-05 ENCOUNTER — Ambulatory Visit (INDEPENDENT_AMBULATORY_CARE_PROVIDER_SITE_OTHER): Payer: Medicare Other | Admitting: Student

## 2021-11-05 VITALS — BP 98/60 | HR 118 | Temp 98.1°F | Ht 64.0 in | Wt 172.0 lb

## 2021-11-05 DIAGNOSIS — I890 Lymphedema, not elsewhere classified: Secondary | ICD-10-CM | POA: Diagnosis not present

## 2021-11-05 DIAGNOSIS — J90 Pleural effusion, not elsewhere classified: Secondary | ICD-10-CM | POA: Diagnosis present

## 2021-11-05 DIAGNOSIS — R0600 Dyspnea, unspecified: Secondary | ICD-10-CM | POA: Diagnosis not present

## 2021-11-05 NOTE — Progress Notes (Signed)
Thoracentesis  Procedure Note  Lilit Ryals [place]  [id number]  May 11, 1951  Date:11/05/21  Time:11:30 AM   Provider Performing:Madelyn Tlatelpa M Verlee Monte   Procedure: Thoracentesis with imaging guidance (37628)  Indication(s) Pleural Effusion  Consent Risks of the procedure as well as the alternatives and risks of each were explained to the patient and/or caregiver.  Consent for the procedure was obtained and is signed in the bedside chart  Anesthesia Topical only with 1% lidocaine    Time Out Verified patient identification, verified procedure, site/side was marked, verified correct patient position, special equipment/implants available, medications/allergies/relevant history reviewed, required imaging and test results available.   Sterile Technique Maximal sterile technique including full sterile barrier drape, hand hygiene, sterile gown, sterile gloves, mask, hair covering, sterile ultrasound probe cover (if used).  Procedure Description Ultrasound was used to identify appropriate pleural anatomy for placement and overlying skin marked.  Area of drainage cleaned and draped in sterile fashion. Lidocaine was used to anesthetize the skin and subcutaneous tissue.  1500 cc's of straw appearing fluid was drained from the right pleural space. Catheter then removed and bandaid applied to site.     Complications/Tolerance None; patient tolerated the procedure well. Chest X-ray not needed - bedside US without evidence of pneumothorax post procedure.   EBL Minimal   Specimen(s) Pleural fluid

## 2021-11-05 NOTE — Progress Notes (Signed)
Synopsis: Referred for dyspnea by Practice, [facility]  Subjective:   PATIENT ID: [name] GENDER: female DOB: [birth year], MRN: [id number]  Chief Complaint  Patient presents with   Acute Visit    Increased SOB esp when lies down for the past several days. She is coughing some- non prod.    70yF with history of lymphedema, diastolic dysfunction and mildly reduced EF, gastric bypass 2003, secondary hyperparathyroidism, GERD, SVT, IDA, covid-19 infection 12/2019 and 02/2021  Underwent right thoracentesis 10/15/21 1L drained. Cyto/cx negative. Transudate. She says she did feel better after thoracentesis.   She says she feels bad. Very short of breath and weak, orthopneic. Some cough with chest congestion but not coughing anything up. No fever. She says she has normal diarrhea - sort of gold looking/yellow.   Otherwise pertinent review of systems is negative.  No family history of lung cancer  Past Medical History:  Diagnosis Date   Asthma    CHF (congestive heart failure) (HCC)    GERD (gastroesophageal reflux disease)    Internal hemorrhoids    Iron deficiency anemia    Osteoporosis    Peptic ulcer    Tubular adenoma of colon    Vertigo      Family History  Problem Relation Age of Onset   Hypertension Other    Stroke Other    Diabetes Other    Heart attack Other    Obesity Other    Diabetes Father    Heart disease Father    Colon cancer Father    Breast cancer Paternal Grandmother    Healthy Daughter    Stomach cancer Neg Hx    Pancreatic cancer Neg Hx      Past Surgical History:  Procedure Laterality Date   BACK SURGERY     BREAST SURGERY     CHOLECYSTECTOMY     COLONOSCOPY WITH PROPOFOL N/A 06/02/2017   Procedure: COLONOSCOPY WITH PROPOFOL;  Surgeon: Jonathon Bellows, MD;  Location: [facility] ENDOSCOPY;  Service: Gastroenterology;  Laterality: N/A;   COLONOSCOPY WITH PROPOFOL N/A 01/28/2021   Procedure: COLONOSCOPY WITH PROPOFOL;  Surgeon: Jonathon Bellows, MD;   Location: [facility] ENDOSCOPY;  Service: Gastroenterology;  Laterality: N/A;   ESOPHAGOGASTRODUODENOSCOPY (EGD) WITH PROPOFOL N/A 07/01/2017   Procedure: ESOPHAGOGASTRODUODENOSCOPY (EGD) WITH PROPOFOL;  Surgeon: Jonathon Bellows, MD;  Location: [facility] ENDOSCOPY;  Service: Gastroenterology;  Laterality: N/A;   ESOPHAGOGASTRODUODENOSCOPY (EGD) WITH PROPOFOL N/A 01/28/2021   Procedure: ESOPHAGOGASTRODUODENOSCOPY (EGD) WITH PROPOFOL;  Surgeon: Jonathon Bellows, MD;  Location: [facility] ENDOSCOPY;  Service: Gastroenterology;  Laterality: N/A;   FLEXIBLE SIGMOIDOSCOPY N/A 07/01/2017   Procedure: FLEXIBLE SIGMOIDOSCOPY;  Surgeon: Jonathon Bellows, MD;  Location: [facility] ENDOSCOPY;  Service: Gastroenterology;  Laterality: N/A;   KNEE SURGERY     mini gastric bypass     SHOULDER SURGERY     TONSILLECTOMY      Social History   Socioeconomic History   Marital status: Married    Spouse name: Not on file   Number of children: Not on file   Years of education: Not on file   Highest education level: Not on file  Occupational History   Not on file  Tobacco Use   Smoking status: Never   Smokeless tobacco: Never  Vaping Use   Vaping Use: Never used  Substance and Sexual Activity   Alcohol use: No    Alcohol/week: 0.0 standard drinks   Drug use: Never   Sexual activity: Never  Other Topics Concern   Not on file  Social  History Narrative   Not on file   Social Determinants of Health   Financial Resource Strain: Not on file  Food Insecurity: Not on file  Transportation Needs: Not on file  Physical Activity: Not on file  Stress: Not on file  Social Connections: Not on file  Intimate Partner Violence: Not on file     Allergies  Allergen Reactions   Zoledronic Acid Other (See Comments)    Other reaction(s): fever and flu-like symptoms   Azithromycin Diarrhea    Other reaction(s): diarrhea   Lac Bovis Diarrhea   Prednisone Diarrhea   Sulfa Antibiotics Itching   Tape Dermatitis     Outpatient Medications Prior  to Visit  Medication Sig Dispense Refill   albuterol (VENTOLIN HFA) 108 (90 Base) MCG/ACT inhaler Inhale 2 puffs into the lungs in the morning, at noon, in the evening, and at bedtime.     calcitRIOL (ROCALTROL) 0.5 MCG capsule Take 0.5 mcg by mouth daily.  5   Cholecalciferol ([employer] D3) 125 MCG (5000 UT) capsule Take 1 tablet by mouth daily.     denosumab (PROLIA) 60 MG/ML SOSY injection Inject 60 mg into the skin every 6 (six) months.     diltiazem (CARDIZEM) 30 MG tablet Take 1 tablet (30 mg total) by mouth 2 (two) times daily. 60 tablet 2   furosemide (LASIX) 40 MG tablet Take 1 tablet (40 mg total) by mouth daily. 30 tablet 2   levothyroxine (SYNTHROID) 25 MCG tablet Take 25 mcg by mouth every morning.     Melatonin 10 MG TABS Take 1 tablet by mouth as needed.     Multiple Vitamin (M.V.I. ADULT IV) Take 1 tablet by mouth daily.     pantoprazole (PROTONIX) 20 MG tablet Take 1 tablet (20 mg total) by mouth daily. 90 tablet 3   potassium chloride SA (KLOR-CON M) 20 MEQ tablet Take 1 tablet (20 mEq total) by mouth daily. 30 tablet 2   Vitamin A 2400 MCG (8000 UT) CAPS Take 1 capsule by mouth daily.     Vitamin E 180 MG (400 UNIT) CAPS Take 1 capsule by mouth daily.     doxycycline (VIBRA-TABS) 100 MG tablet Take 100 mg by mouth 2 (two) times daily.     Facility-Administered Medications Prior to Visit  Medication Dose Route Frequency Provider Last Rate Last Admin   albumin human 25 % solution 25 g  25 g Intravenous Once Jonathon Bellows, MD           Objective:   Physical Exam:  General appearance: 71 y.o., female, chronically ill appearing Eyes: PERRL, tracking appropriately HENT: NCAT; MMM Neck: Trachea midline; no lymphadenopathy, no JVD Lungs: diminished on right, no crackles, no wheeze, with normal respiratory effort CV: tachy, RR, no murmur  Abdomen: Soft, non-tender; non-distended, BS present  Extremities: 2+ BLE edema, warm Skin: Normal turgor and texture; no rash Psych:  Appropriate affect Neuro: Alert and oriented to person and place, no focal deficit     Vitals:   11/05/21 0919  BP: 98/60  Pulse: (!) 118  Temp: 98.1 F (36.7 C)  TempSrc: Oral  SpO2: 95%  Weight: 172 lb (78 kg)  Height: 5\' 4"  (1.626 m)   95% on RA BMI Readings from Last 3 Encounters:  11/05/21 29.52 kg/m  10/20/21 29.08 kg/m  10/14/21 28.49 kg/m   Wt Readings from Last 3 Encounters:  11/05/21 172 lb (78 kg)  10/20/21 169 lb 6.4 oz (76.8 kg)  10/14/21 166 lb (75.3  kg)     CBC    Component Value Date/Time   WBC 6.5 05/29/2021 1251   RBC 3.74 (L) 05/29/2021 1251   HGB 12.1 05/29/2021 1251   HGB 10.8 (L) 09/25/2016 1518   HCT 38.1 05/29/2021 1251   HCT 32.6 (L) 09/25/2016 1518   PLT 191 05/29/2021 1251   PLT 264 09/25/2016 1518   MCV 101.9 (H) 05/29/2021 1251   MCV 96 09/25/2016 1518   MCH 32.4 05/29/2021 1251   MCHC 31.8 05/29/2021 1251   RDW 13.9 05/29/2021 1251   RDW 15.5 (H) 09/25/2016 1518   LYMPHSABS 1.6 05/29/2021 1251   LYMPHSABS 1.2 09/25/2016 1518   MONOABS 0.4 05/29/2021 1251   EOSABS 0.1 05/29/2021 1251   EOSABS 0.1 09/25/2016 1518   BASOSABS 0.0 05/29/2021 1251   BASOSABS 0.0 09/25/2016 1518    Chest Imaging:   CXR 10/20/21 reviewed by me with slight increase in volume of right effusion  CT A/P lung bases 10/17/21 reviewed by me remarkable for  and L effusion, anterior RLL atelectasis more likely than consolidation/pneumonia. Diffuse small bowel wall thickening and mucosal hyperenhancement. Anasarca. Severe hepatic steatosis.  Pulmonary Functions Testing Results: No flowsheet data found.    Echocardiogram:   TTE 07/25/22:   1. Technically difficult study.    2. The left ventricle is upper normal in size with normal wall thickness.    3. The left ventricular systolic function is mildly decreased, LVEF is  visually estimated at 45-50%.    4. There is grade I diastolic dysfunction (impaired relaxation).    5. There is mild mitral valve  regurgitation.    6. There is mild aortic regurgitation.    7. The left atrium is moderately dilated in size.    8. The right ventricle is normal in size, with normal systolic function.   Assessment & Plan:   # Recurrent right pleural effusion: # Hypoalbuminemia # Chronic diastolic heart failure Transudative. Probably multifactorial in setting hypoalbuminemia due to ?protein-losing enteropathy (longstanding diarrhea with gold color raises concern for malabsoprtion) and/or impaired protein synthesis in setting severe hepatic steatosis with or without cirrhosis, diastolic dysfunction. Per GI notes she has had low albumin ascites in past which could reflect effect of portal hypertension however agree that size of pleural effusion relative to ascites seems odd for hepatic hydrothorax. UA without significant proteinuria  # history of asthma  Plan: - thoracentesis performed today, can perform again if needed for symptomatic relief if recurrent despite use of lasix - usual thora studies sent today - would hold off on lasix tomorrow - GI has referred to [facility] for evaluation of possible cirrhosis. This doesn't clearly seem to me like hepatic hydrothorax from information I have available so I'm unsure what role there is at present for TIPS or palliative placement of TPC/PleurX - Will send message to Dr. Vicente Males to see if any workup warranted for protein-losing enteropathy - repeat BNP at next visit given equivocal results and if elevated consideration of new TTE   RTC next month with APP   Maryjane Hurter, MD [facility] 11/05/2021 9:23 AM

## 2021-11-05 NOTE — Therapy (Signed)
[place], [place], [address] Phone: [phone]   Fax:  [phone]  Physical Therapy Treatment  Patient Details  Name: [name] Burns MRN: [id number] Date of Birth: 03-Nov-1950 Referring Provider (PT): Stana Bunting   Encounter Date: 11/05/2021   PT End of Session - 11/05/21 1317     Visit Number 14    Number of Visits 28    Date for PT Re-Evaluation 12/15/21    Authorization Type [employer] medicare    Progress Note Due on Visit 20    PT Start Time 1320    PT Stop Time 1445    PT Time Calculation (min) 85 min    Activity Tolerance Patient tolerated treatment well    Behavior During Therapy [facility] for tasks assessed/performed             Past Medical History:  Diagnosis Date   Asthma    CHF (congestive heart failure) (HCC)    GERD (gastroesophageal reflux disease)    Internal hemorrhoids    Iron deficiency anemia    Osteoporosis    Peptic ulcer    Tubular adenoma of colon    Vertigo     Past Surgical History:  Procedure Laterality Date   BACK SURGERY     BREAST SURGERY     CHOLECYSTECTOMY     COLONOSCOPY WITH PROPOFOL N/A 06/02/2017   Procedure: COLONOSCOPY WITH PROPOFOL;  Surgeon: Jonathon Bellows, MD;  Location: [facility] ENDOSCOPY;  Service: Gastroenterology;  Laterality: N/A;   COLONOSCOPY WITH PROPOFOL N/A 01/28/2021   Procedure: COLONOSCOPY WITH PROPOFOL;  Surgeon: Jonathon Bellows, MD;  Location: [facility] ENDOSCOPY;  Service: Gastroenterology;  Laterality: N/A;   ESOPHAGOGASTRODUODENOSCOPY (EGD) WITH PROPOFOL N/A 07/01/2017   Procedure: ESOPHAGOGASTRODUODENOSCOPY (EGD) WITH PROPOFOL;  Surgeon: Jonathon Bellows, MD;  Location: [facility] ENDOSCOPY;  Service: Gastroenterology;  Laterality: N/A;   ESOPHAGOGASTRODUODENOSCOPY (EGD) WITH PROPOFOL N/A 01/28/2021   Procedure: ESOPHAGOGASTRODUODENOSCOPY (EGD) WITH PROPOFOL;  Surgeon: Jonathon Bellows, MD;  Location: [facility] ENDOSCOPY;  Service: Gastroenterology;  Laterality: N/A;    FLEXIBLE SIGMOIDOSCOPY N/A 07/01/2017   Procedure: FLEXIBLE SIGMOIDOSCOPY;  Surgeon: Jonathon Bellows, MD;  Location: [facility] ENDOSCOPY;  Service: Gastroenterology;  Laterality: N/A;   KNEE SURGERY     mini gastric bypass     SHOULDER SURGERY     TONSILLECTOMY      There were no vitals filed for this visit.   Subjective Assessment - 11/05/21 1316     Subjective Pt has no questions.  States she had fluid drained off of her today and feels better.    Pertinent History lymphedema, tumor of the brain , CHF,    Patient Stated Goals to get her swelling down                               [facility] Adult PT Treatment/Exercise - 11/05/21 0001       Manual Therapy   Manual Therapy Compression Bandaging;Manual Lymphatic Drainage (MLD);Other (comment)    Manual therapy comments completed seperate from all other aspects    Manual Lymphatic Drainage (MLD) to include supraclavicular, deep and superfical abdominal, inguinal axillary anastomosis followed by B LEboth anterior and posteriorly    Compression Bandaging multilayer short stretch bandages with 1/2" foam from foot to thighB                       PT Short Term Goals - 11/03/21 1450  PT SHORT TERM GOAL #1   Title Pt to be I in self manual techniques.    Time 2    Period Weeks    Status On-going    Target Date 10/08/21      PT SHORT TERM GOAL #2   Title PT to have decreased 1.5cm in thigh and leg,    Time 2    Period Weeks    Status On-going               PT Long Term Goals - 11/03/21 1450       PT LONG TERM GOAL #1   Title PT to have ankle foot wrap, LE and thigh juxtafit and be able to Timmothy Sours I    Time 5    Period Weeks    Status On-going    Target Date 10/29/21      PT LONG TERM GOAL #2   Title PT to have lost between 3 cm from thigh and leg measurements on average,    Time 5    Period Weeks    Status On-going      PT LONG TERM GOAL #3   Title PT pain to be no greater than a 6/10 in  LE    Time 5    Period Weeks    Status On-going                   Plan - 11/05/21 1317     Clinical Impression Statement Therapist notes pt has increased induration especially along the medial aspect of lower thigh, therefore extra manual completed in this area.    Personal Factors and Comorbidities Comorbidity 2;Comorbidity 3+;Fitness;Time since onset of injury/illness/exacerbation    Comorbidities gastric bypass followed by plastic surgery to remove excessive skin as pt lost over 250#, OA,    Examination-Activity Limitations Dressing;Locomotion Level;Stand    Examination-Participation Restrictions Cleaning;Community Activity;Shop;Meal Prep    Stability/Clinical Decision Making Evolving/Moderate complexity    Rehab Potential Good    PT Frequency 3x / week    PT Duration 6 weeks    PT Treatment/Interventions Manual techniques;Therapeutic activities;Patient/family education;Manual lymph drainage;Therapeutic exercise    PT Next Visit Plan Continue with complete lymphatic therapy.  Measure weekly (Wednesdays).    PT Home Exercise Plan ankle pump, LAQ, hip ab/adduction, march, diaphragmic breathing and lymphatic squeeze.; self manual    Consulted and Agree with Plan of Care Patient             Patient will benefit from skilled therapeutic intervention in order to improve the following deficits and impairments:  Pain, Difficulty walking, Increased edema  Visit Diagnosis: Lymphedema, not elsewhere classified     Problem List Patient Active Problem List   Diagnosis Date Noted   Hypothyroidism 10/29/2021   Pleural effusion 10/20/2021   Chronic heart failure ([place]) 10/20/2021   NSVT (nonsustained ventricular tachycardia) 10/20/2021   Bariatric surgery status 10/07/2021   Cellulitis of umbilicus 31/54/0086   Edema 10/07/2021   Essential tremor 10/07/2021   History of adenomatous polyp of colon 10/07/2021   Hypoalbuminemia 10/07/2021   Hypocalcemia 10/07/2021    Insomnia 10/07/2021   Internal hemorrhoids 10/07/2021   Kidney stone 10/07/2021   Low back pain 10/07/2021   Malnutrition ([place]) 10/07/2021   Osteoporosis 10/07/2021   Pathological fracture of vertebra 10/07/2021   Peptic ulcer disease 10/07/2021   Spontaneous ecchymosis 10/07/2021   Unilateral primary osteoarthritis, unspecified knee 05/06/2021   Acute exacerbation of chronic obstructive pulmonary disease (COPD) ([place]) 04/08/2021  History of anemia 04/08/2021   History of asthma 04/08/2021   History of COVID-19 04/08/2021   Shortness of breath 04/08/2021   Acute COVID-19 03/08/2021   Cough 03/08/2021   Fever 03/08/2021   Colon cancer screening 01/08/2021   Arthritis of right hand 11/15/2019   Age-related osteoporosis without current pathological fracture 10/30/2019   Left-sided weakness    Stroke-like symptoms 02/24/2018   HTN (hypertension) 02/24/2018   Primary osteoarthritis of both hands 11/03/2017   Dupuytren's contracture of left hand 11/03/2017   DDD (degenerative disc disease), lumbar 11/03/2017   History of gastric bypass 11/03/2017   Lymphedema 09/17/2017   History of vertebral fracture 09/17/2017   History of osteoporosis 09/17/2017   Iron deficiency anemia due to chronic blood loss 06/28/2017   Lower GI bleed 05/31/2017   Rectal mass 05/31/2017   Asthmatic bronchitis without complication 59/29/2446   GERD (gastroesophageal reflux disease) 05/31/2017   Closed fracture of upper end of humerus 09/23/2016   Tendinitis 09/23/2016   Patulous eustachian tube of right ear 11/07/2015   Superior semicircular canal dehiscence, 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 [place], [place], [address] Phone: [phone]   Fax:  [phone]  Name: [name] MRN:  [id number] Date of Birth: 1951/04/02

## 2021-11-06 LAB — CYTOLOGY - NON PAP

## 2021-11-07 ENCOUNTER — Ambulatory Visit (HOSPITAL_COMMUNITY): Payer: Medicare Other | Admitting: Physical Therapy

## 2021-11-07 ENCOUNTER — Other Ambulatory Visit: Payer: Self-pay

## 2021-11-07 DIAGNOSIS — I890 Lymphedema, not elsewhere classified: Secondary | ICD-10-CM | POA: Diagnosis not present

## 2021-11-07 NOTE — Therapy (Signed)
[place], [place], [address] Phone: [phone]   Fax:  [phone]  Physical Therapy Treatment  Patient Details  Name: [name] MRN: [id number] Date of Birth: 09-07-1951 Referring Provider (PT): Stana Bunting   Encounter Date: 11/07/2021   PT End of Session - 11/07/21 1314     Visit Number 15    Number of Visits 28    Date for PT Re-Evaluation 12/15/21    Authorization Type UHC medicare    Progress Note Due on Visit 20    PT Start Time 1318    PT Stop Time 1430    PT Time Calculation (min) 72 min             Past Medical History:  Diagnosis Date   Asthma    CHF (congestive heart failure) (HCC)    GERD (gastroesophageal reflux disease)    Internal hemorrhoids    Iron deficiency anemia    Osteoporosis    Peptic ulcer    Tubular adenoma of colon    Vertigo     Past Surgical History:  Procedure Laterality Date   BACK SURGERY     BREAST SURGERY     CHOLECYSTECTOMY     COLONOSCOPY WITH PROPOFOL N/A 06/02/2017   Procedure: COLONOSCOPY WITH PROPOFOL;  Surgeon: Jonathon Bellows, MD;  Location: [facility] ENDOSCOPY;  Service: Gastroenterology;  Laterality: N/A;   COLONOSCOPY WITH PROPOFOL N/A 01/28/2021   Procedure: COLONOSCOPY WITH PROPOFOL;  Surgeon: Jonathon Bellows, MD;  Location: [facility] ENDOSCOPY;  Service: Gastroenterology;  Laterality: N/A;   ESOPHAGOGASTRODUODENOSCOPY (EGD) WITH PROPOFOL N/A 07/01/2017   Procedure: ESOPHAGOGASTRODUODENOSCOPY (EGD) WITH PROPOFOL;  Surgeon: Jonathon Bellows, MD;  Location: [facility] ENDOSCOPY;  Service: Gastroenterology;  Laterality: N/A;   ESOPHAGOGASTRODUODENOSCOPY (EGD) WITH PROPOFOL N/A 01/28/2021   Procedure: ESOPHAGOGASTRODUODENOSCOPY (EGD) WITH PROPOFOL;  Surgeon: Jonathon Bellows, MD;  Location: [facility] ENDOSCOPY;  Service: Gastroenterology;  Laterality: N/A;   FLEXIBLE SIGMOIDOSCOPY N/A 07/01/2017   Procedure: FLEXIBLE SIGMOIDOSCOPY;  Surgeon: Jonathon Bellows, MD;  Location: [facility]  ENDOSCOPY;  Service: Gastroenterology;  Laterality: N/A;   KNEE SURGERY     mini gastric bypass     SHOULDER SURGERY     TONSILLECTOMY      There were no vitals filed for this visit.   Subjective Assessment - 11/07/21 1435     Subjective Pt states that she was able to keep the Rt bandages on but she had to take the Lt off yesterday morning due to increase pain in her foot.    Pertinent History lymphedema, tumor of the brain , CHF,    Patient Stated Goals to get her swelling down                   LYMPHEDEMA/ONCOLOGY QUESTIONNAIRE - 11/07/21 0001       Right Lower Extremity Lymphedema   20 cm Proximal to Suprapatella 57.3 cm    10 cm Proximal to Suprapatella 51 cm    At Midpatella/Popliteal Crease 44.2 cm    30 cm Proximal to Floor at Lateral Plantar Foot 41.3 cm    20 cm Proximal to Floor at Lateral Plantar Foot 40.2 1    10  cm Proximal to Floor at Lateral Malleoli 30.3 cm    Circumference of ankle/heel 33.7 cm.    5 cm Proximal to 1st MTP Joint 22.7 cm    Across MTP Joint 22.3 cm      Left Lower Extremity Lymphedema   20 cm Proximal to  Suprapatella 58 cm    10 cm Proximal to Suprapatella 54.8 cm    At Midpatella/Popliteal Crease 45.5 cm    30 cm Proximal to Floor at Lateral Plantar Foot 46 cm    20 cm Proximal to Floor at Lateral Plantar Foot 44.5 cm    10 cm Proximal to Floor at Lateral Malleoli 32.2 cm    Circumference of ankle/heel 36.1 cm.    5 cm Proximal to 1st MTP Joint 22.8 cm    Across MTP Joint 22.8 cm                        [facility] Adult PT Treatment/Exercise - 11/07/21 0001       Manual Therapy   Manual Therapy Compression Bandaging;Manual Lymphatic Drainage (MLD);Other (comment)    Manual therapy comments completed seperate from all other aspects    Manual Lymphatic Drainage (MLD) to include supraclavicular, deep and superfical abdominal, inguinal axillary anastomosis followed by B LEboth anterior and posteriorly    Compression  Bandaging multilayer short stretch bandages with 1/2" foam from foot to thighB    Other Manual Therapy measurements                       PT Short Term Goals - 11/03/21 1450       PT SHORT TERM GOAL #1   Title Pt to be I in self manual techniques.    Time 2    Period Weeks    Status On-going    Target Date 10/08/21      PT SHORT TERM GOAL #2   Title PT to have decreased 1.5cm in thigh and leg,    Time 2    Period Weeks    Status On-going               PT Long Term Goals - 11/03/21 1450       PT LONG TERM GOAL #1   Title PT to have ankle foot wrap, LE and thigh juxtafit and be able to Timmothy Sours I    Time 5    Period Weeks    Status On-going    Target Date 10/29/21      PT LONG TERM GOAL #2   Title PT to have lost between 3 cm from thigh and leg measurements on average,    Time 5    Period Weeks    Status On-going      PT LONG TERM GOAL #3   Title PT pain to be no greater than a 6/10 in LE    Time 5    Period Weeks    Status On-going                   Plan - 11/07/21 1315     Clinical Impression Statement Pt remeasured with slight decrease of left and significant decrease of Rt comparted to Mondays measurements.  Therapist focused manual 70% on left ; 30 on RT.  Marland Kitchen  Pt continues to complete her exercises.    Personal Factors and Comorbidities Comorbidity 2;Comorbidity 3+;Fitness;Time since onset of injury/illness/exacerbation    Comorbidities gastric bypass followed by plastic surgery to remove excessive skin as pt lost over 250#, OA,    Examination-Activity Limitations Dressing;Locomotion Level;Stand    Examination-Participation Restrictions Cleaning;Community Activity;Shop;Meal Prep    Stability/Clinical Decision Making Evolving/Moderate complexity    Rehab Potential Good    PT Frequency 3x / week    PT  Duration 6 weeks    PT Treatment/Interventions Manual techniques;Therapeutic activities;Patient/family education;Manual lymph  drainage;Therapeutic exercise    PT Next Visit Plan Continue with complete lymphatic therapy.  Measure weekly (Fridays ).    PT Home Exercise Plan ankle pump, LAQ, hip ab/adduction, march, diaphragmic breathing and lymphatic squeeze.; self manual    Consulted and Agree with Plan of Care Patient             Patient will benefit from skilled therapeutic intervention in order to improve the following deficits and impairments:  Pain, Difficulty walking, Increased edema  Visit Diagnosis: Lymphedema, not elsewhere classified     Problem List Patient Active Problem List   Diagnosis Date Noted   Hypothyroidism 10/29/2021   Pleural effusion 10/20/2021   Chronic heart failure ([place]) 10/20/2021   NSVT (nonsustained ventricular tachycardia) 10/20/2021   Bariatric surgery status 10/07/2021   Cellulitis of umbilicus 63/14/9702   Edema 10/07/2021   Essential tremor 10/07/2021   History of adenomatous polyp of colon 10/07/2021   Hypoalbuminemia 10/07/2021   Hypocalcemia 10/07/2021   Insomnia 10/07/2021   Internal hemorrhoids 10/07/2021   Kidney stone 10/07/2021   Low back pain 10/07/2021   Malnutrition ([place]) 10/07/2021   Osteoporosis 10/07/2021   Pathological fracture of vertebra 10/07/2021   Peptic ulcer disease 10/07/2021   Spontaneous ecchymosis 10/07/2021   Unilateral primary osteoarthritis, unspecified knee 05/06/2021   Acute exacerbation of chronic obstructive pulmonary disease (COPD) ([place]) 04/08/2021   History of anemia 04/08/2021   History of asthma 04/08/2021   History of COVID-19 04/08/2021   Shortness of breath 04/08/2021   Acute COVID-19 03/08/2021   Cough 03/08/2021   Fever 03/08/2021   Colon cancer screening 01/08/2021   Arthritis of right hand 11/15/2019   Age-related osteoporosis without current pathological fracture 10/30/2019   Left-sided weakness    Stroke-like symptoms 02/24/2018   HTN (hypertension) 02/24/2018   Primary osteoarthritis of both hands  11/03/2017   Dupuytren's contracture of left hand 11/03/2017   DDD (degenerative disc disease), lumbar 11/03/2017   History of gastric bypass 11/03/2017   Lymphedema 09/17/2017   History of vertebral fracture 09/17/2017   History of osteoporosis 09/17/2017   Iron deficiency anemia due to chronic blood loss 06/28/2017   Lower GI bleed 05/31/2017   Rectal mass 05/31/2017   Asthmatic bronchitis without complication 63/78/5885   GERD (gastroesophageal reflux disease) 05/31/2017   Closed fracture of upper end of humerus 09/23/2016   Tendinitis 09/23/2016   Patulous eustachian tube of right ear 11/07/2015   Superior semicircular canal dehiscence, 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

## 2021-11-09 LAB — GRAM STAIN/BODY FLUID CULTURE: Organism ID, Bacteria: NONE SEEN

## 2021-11-10 ENCOUNTER — Emergency Department (HOSPITAL_COMMUNITY)
Admission: EM | Admit: 2021-11-10 | Discharge: 2021-11-10 | Disposition: A | Discharge status: Home / Self Care | Payer: Medicare Other | Attending: Emergency Medicine | Admitting: Emergency Medicine

## 2021-11-10 ENCOUNTER — Encounter (HOSPITAL_COMMUNITY): Payer: Self-pay | Admitting: Pharmacy Technician

## 2021-11-10 ENCOUNTER — Telehealth: Payer: Self-pay | Admitting: Cardiology

## 2021-11-10 ENCOUNTER — Ambulatory Visit (HOSPITAL_COMMUNITY): Payer: Medicare Other | Admitting: Physical Therapy

## 2021-11-10 ENCOUNTER — Emergency Department (HOSPITAL_COMMUNITY): Payer: Medicare Other

## 2021-11-10 ENCOUNTER — Other Ambulatory Visit: Payer: Self-pay

## 2021-11-10 ENCOUNTER — Encounter: Payer: Self-pay | Admitting: Student

## 2021-11-10 DIAGNOSIS — Z79899 Other long term (current) drug therapy: Secondary | ICD-10-CM | POA: Diagnosis not present

## 2021-11-10 DIAGNOSIS — R262 Difficulty in walking, not elsewhere classified: Secondary | ICD-10-CM | POA: Diagnosis not present

## 2021-11-10 DIAGNOSIS — J90 Pleural effusion, not elsewhere classified: Secondary | ICD-10-CM | POA: Diagnosis not present

## 2021-11-10 DIAGNOSIS — M545 Low back pain, unspecified: Secondary | ICD-10-CM | POA: Diagnosis not present

## 2021-11-10 DIAGNOSIS — Z20822 Contact with and (suspected) exposure to covid-19: Secondary | ICD-10-CM | POA: Insufficient documentation

## 2021-11-10 DIAGNOSIS — I890 Lymphedema, not elsewhere classified: Secondary | ICD-10-CM

## 2021-11-10 DIAGNOSIS — R0602 Shortness of breath: Secondary | ICD-10-CM | POA: Diagnosis present

## 2021-11-10 DIAGNOSIS — I5032 Chronic diastolic (congestive) heart failure: Secondary | ICD-10-CM

## 2021-11-10 DIAGNOSIS — R519 Headache, unspecified: Secondary | ICD-10-CM | POA: Insufficient documentation

## 2021-11-10 DIAGNOSIS — W19XXXA Unspecified fall, initial encounter: Secondary | ICD-10-CM

## 2021-11-10 LAB — COMPREHENSIVE METABOLIC PANEL
ALT: 35 U/L (ref 0–44)
AST: 57 U/L — ABNORMAL HIGH (ref 15–41)
Albumin: 2 g/dL — ABNORMAL LOW (ref 3.5–5.0)
Alkaline Phosphatase: 86 U/L (ref 38–126)
Anion gap: 8 (ref 5–15)
BUN: 11 mg/dL (ref 8–23)
CO2: 24 mmol/L (ref 22–32)
Calcium: 7.3 mg/dL — ABNORMAL LOW (ref 8.9–10.3)
Chloride: 109 mmol/L (ref 98–111)
Creatinine, Ser: 0.64 mg/dL (ref 0.44–1.00)
GFR, Estimated: >60 mL/min (ref >60)
Glucose, Bld: 92 mg/dL (ref 70–99)
Potassium: 4.4 mmol/L (ref 3.5–5.1)
Sodium: 141 mmol/L (ref 135–145)
Total Bilirubin: 1 mg/dL (ref 0.3–1.2)
Total Protein: 4.7 g/dL — ABNORMAL LOW (ref 6.5–8.1)

## 2021-11-10 LAB — CBC WITH DIFFERENTIAL/PLATELET
Abs Immature Granulocytes: 0.02 10*3/uL (ref 0.00–0.07)
Basophils Absolute: 0 10*3/uL (ref 0.0–0.1)
Basophils Relative: 1 %
Eosinophils Absolute: 0 10*3/uL (ref 0.0–0.5)
Eosinophils Relative: 1 %
HCT: 39.5 % (ref 36.0–46.0)
Hemoglobin: 12.6 g/dL (ref 12.0–15.0)
Immature Granulocytes: 0 %
Lymphocytes Relative: 19 %
Lymphs Abs: 1 10*3/uL (ref 0.7–4.0)
MCH: 31.3 pg (ref 26.0–34.0)
MCHC: 31.9 g/dL (ref 30.0–36.0)
MCV: 98.3 fL (ref 80.0–100.0)
Monocytes Absolute: 0.5 10*3/uL (ref 0.1–1.0)
Monocytes Relative: 9 %
Neutro Abs: 3.8 10*3/uL (ref 1.7–7.7)
Neutrophils Relative %: 70 %
Platelets: 227 10*3/uL (ref 150–400)
RBC: 4.02 MIL/uL (ref 3.87–5.11)
RDW: 16 % — ABNORMAL HIGH (ref 11.5–15.5)
WBC: 5.4 10*3/uL (ref 4.0–10.5)
nRBC: 0 % (ref 0.0–0.2)

## 2021-11-10 LAB — BRAIN NATRIURETIC PEPTIDE: B Natriuretic Peptide: 103 pg/mL — ABNORMAL HIGH (ref 0.0–100.0)

## 2021-11-10 LAB — RESP PANEL BY RT-PCR (FLU A&B, COVID) ARPGX2
Influenza A by PCR: NEGATIVE
Influenza B by PCR: NEGATIVE
SARS Coronavirus 2 by RT PCR: NEGATIVE

## 2021-11-10 LAB — TROPONIN I (HIGH SENSITIVITY)
Troponin I (High Sensitivity): 3 ng/L (ref <18)
Troponin I (High Sensitivity): 8 ng/L (ref <18)

## 2021-11-10 MED ORDER — BACITRACIN ZINC 500 UNIT/GM EX OINT: TOPICAL_OINTMENT | Freq: Two times a day (BID) | CUTANEOUS | Status: DC

## 2021-11-10 MED ORDER — MORPHINE SULFATE (PF) 4 MG/ML IV SOLN
2.0000 mg | Freq: Once | INTRAVENOUS | Status: AC
  Administered 2021-11-10: 2 mg via INTRAVENOUS
  Filled 2021-11-10: qty 1

## 2021-11-10 NOTE — Telephone Encounter (Signed)
Left message to return call 

## 2021-11-10 NOTE — Telephone Encounter (Signed)
Call placed to [place], [place] - stated she just recently saw patient in lymphedema clinic.  Wanted to make Dr. Harl Bowie aware of elevation in her BNP.  Looks like BNP on 10/20/21 was ordered by Marland Kitchen, NP (pulmonology) level was 80.  See notes in epic from that provider on that lab.  Repeat proBNP done on 10/31/2021 was 1,025.  Last seen by Dr. Harl Bowie on 10/13/2021.   Seth Bake, [place] states that she does c/o bilateral LE edema.  Is using her Lymphedema pump.  No other c/o SOB or CP.

## 2021-11-10 NOTE — ED Triage Notes (Signed)
Pt bib ems found down in parking lot, while trying to get into her vehicle. Pt reports she can not breathe, arrives on 15L NRB.pt appears uncomfortable.

## 2021-11-10 NOTE — Therapy (Signed)
[place], [place], [address] Phone: [phone]   Fax:  [phone]  Physical Therapy Treatment  Patient Details  Name: [name] MRN: [id number] Date of Birth: [birth year] Referring Provider (PT): Stana Bunting   Encounter Date: 11/10/2021   PT End of Session - 11/10/21 1450     Visit Number 16    Number of Visits 28    Date for PT Re-Evaluation 12/15/21    Authorization Type UHC medicare    Progress Note Due on Visit 20    PT Start Time 1320    PT Stop Time 1410    PT Time Calculation (min) 50 min             Past Medical History:  Diagnosis Date   Asthma    CHF (congestive heart failure) (HCC)    GERD (gastroesophageal reflux disease)    Internal hemorrhoids    Iron deficiency anemia    Osteoporosis    Peptic ulcer    Tubular adenoma of colon    Vertigo     Past Surgical History:  Procedure Laterality Date   BACK SURGERY     BREAST SURGERY     CHOLECYSTECTOMY     COLONOSCOPY WITH PROPOFOL N/A 06/02/2017   Procedure: COLONOSCOPY WITH PROPOFOL;  Surgeon: Jonathon Bellows, MD;  Location: [facility] ENDOSCOPY;  Service: Gastroenterology;  Laterality: N/A;   COLONOSCOPY WITH PROPOFOL N/A 01/28/2021   Procedure: COLONOSCOPY WITH PROPOFOL;  Surgeon: Jonathon Bellows, MD;  Location: [facility] ENDOSCOPY;  Service: Gastroenterology;  Laterality: N/A;   ESOPHAGOGASTRODUODENOSCOPY (EGD) WITH PROPOFOL N/A 07/01/2017   Procedure: ESOPHAGOGASTRODUODENOSCOPY (EGD) WITH PROPOFOL;  Surgeon: Jonathon Bellows, MD;  Location: [facility] ENDOSCOPY;  Service: Gastroenterology;  Laterality: N/A;   ESOPHAGOGASTRODUODENOSCOPY (EGD) WITH PROPOFOL N/A 01/28/2021   Procedure: ESOPHAGOGASTRODUODENOSCOPY (EGD) WITH PROPOFOL;  Surgeon: Jonathon Bellows, MD;  Location: [facility] ENDOSCOPY;  Service: Gastroenterology;  Laterality: N/A;   FLEXIBLE SIGMOIDOSCOPY N/A 07/01/2017   Procedure: FLEXIBLE SIGMOIDOSCOPY;  Surgeon: Jonathon Bellows, MD;  Location: [facility]  ENDOSCOPY;  Service: Gastroenterology;  Laterality: N/A;   KNEE SURGERY     mini gastric bypass     SHOULDER SURGERY     TONSILLECTOMY      There were no vitals filed for this visit.   Subjective Assessment - 11/10/21 1445     Subjective pt states she has an appt with the liver specialist tomorrow.  States she's having  a hard time catching her breath today and her stomach is distended.  STates she's messaged her primary and cardiologist but they cannot get her in today.  Steates she feels like she needs the fluid pumped off again.    Currently in Pain? No/denies                               [facility] Adult PT Treatment/Exercise - 11/10/21 0001       Manual Therapy   Manual Therapy Compression Bandaging    Manual therapy comments completed seperate from all other aspects    Compression Bandaging multilayer short stretch bandages with 1/2" foam from foot to thighB                       PT Short Term Goals - 11/03/21 1450       PT SHORT TERM GOAL #1   Title Pt to be I in self manual techniques.    Time  2    Period Weeks    Status On-going    Target Date 10/08/21      PT SHORT TERM GOAL #2   Title PT to have decreased 1.5cm in thigh and leg,    Time 2    Period Weeks    Status On-going               PT Long Term Goals - 11/03/21 1450       PT LONG TERM GOAL #1   Title PT to have ankle foot wrap, LE and thigh juxtafit and be able to Timmothy Sours I    Time 5    Period Weeks    Status On-going    Target Date 10/29/21      PT LONG TERM GOAL #2   Title PT to have lost between 3 cm from thigh and leg measurements on average,    Time 5    Period Weeks    Status On-going      PT LONG TERM GOAL #3   Title PT pain to be no greater than a 6/10 in LE    Time 5    Period Weeks    Status On-going                   Plan - 11/10/21 1450     Clinical Impression Statement Pt with increased difficulty breathing today; discussed with  evaluating therapist and decided to hold manual and just complete bandaging.  PT instructed to remove bandaging if her breathing continued to get worse folllowing the addition of compression.  Discussed finding different shoes as the bedroom shoes she wears are too large and have no backs.  Pt states she has to shuffle her feet to keep them on.  Pt incidentally fell when walking from clinic to her car and another pateint called 911 to assist her.  Noted abraisons on forehead, Rt temporal area and superior lip.  Also alongside of Rt pinky finger.  Pt also c/o Rt knee discomfort.  Pt made comfortable until EMS arrived.    Personal Factors and Comorbidities Comorbidity 2;Comorbidity 3+;Fitness;Time since onset of injury/illness/exacerbation    Comorbidities gastric bypass followed by plastic surgery to remove excessive skin as pt lost over 250#, OA,    Examination-Activity Limitations Dressing;Locomotion Level;Stand    Examination-Participation Restrictions Cleaning;Community Activity;Shop;Meal Prep    Stability/Clinical Decision Making Evolving/Moderate complexity    Rehab Potential Good    PT Frequency 3x / week    PT Duration 6 weeks    PT Treatment/Interventions Manual techniques;Therapeutic activities;Patient/family education;Manual lymph drainage;Therapeutic exercise    PT Next Visit Plan Continue with complete lymphatic therapy.  F/U on ED trip following fall as well as appt with liver specialist.  Resume manual when breathing improved. Measure weekly (Fridays ).    PT Home Exercise Plan ankle pump, LAQ, hip ab/adduction, march, diaphragmic breathing and lymphatic squeeze.; self manual    Consulted and Agree with Plan of Care Patient             Patient will benefit from skilled therapeutic intervention in order to improve the following deficits and impairments:  Pain, Difficulty walking, Increased edema  Visit Diagnosis: Lymphedema, not elsewhere classified  Difficulty in walking, not  elsewhere classified     Problem List Patient Active Problem List   Diagnosis Date Noted   Hypothyroidism 10/29/2021   Pleural effusion 10/20/2021   Chronic heart failure ([place]) 10/20/2021   NSVT (nonsustained ventricular tachycardia) 10/20/2021  Bariatric surgery status 10/07/2021   Cellulitis of umbilicus 19/75/8832   Edema 10/07/2021   Essential tremor 10/07/2021   History of adenomatous polyp of colon 10/07/2021   Hypoalbuminemia 10/07/2021   Hypocalcemia 10/07/2021   Insomnia 10/07/2021   Internal hemorrhoids 10/07/2021   Kidney stone 10/07/2021   Low back pain 10/07/2021   Malnutrition ([place]) 10/07/2021   Osteoporosis 10/07/2021   Pathological fracture of vertebra 10/07/2021   Peptic ulcer disease 10/07/2021   Spontaneous ecchymosis 10/07/2021   Unilateral primary osteoarthritis, unspecified knee 05/06/2021   Acute exacerbation of chronic obstructive pulmonary disease (COPD) ([place]) 04/08/2021   History of anemia 04/08/2021   History of asthma 04/08/2021   History of COVID-19 04/08/2021   Shortness of breath 04/08/2021   Acute COVID-19 03/08/2021   Cough 03/08/2021   Fever 03/08/2021   Colon cancer screening 01/08/2021   Arthritis of right hand 11/15/2019   Age-related osteoporosis without current pathological fracture 10/30/2019   Left-sided weakness    Stroke-like symptoms 02/24/2018   HTN (hypertension) 02/24/2018   Primary osteoarthritis of both hands 11/03/2017   Dupuytren's contracture of left hand 11/03/2017   DDD (degenerative disc disease), lumbar 11/03/2017   History of gastric bypass 11/03/2017   Lymphedema 09/17/2017   History of vertebral fracture 09/17/2017   History of osteoporosis 09/17/2017   Iron deficiency anemia due to chronic blood loss 06/28/2017   Lower GI bleed 05/31/2017   Rectal mass 05/31/2017   Asthmatic bronchitis without complication 54/98/2641   GERD (gastroesophageal reflux disease) 05/31/2017   Closed fracture of upper end of  humerus 09/23/2016   Tendinitis 09/23/2016   Patulous eustachian tube of right ear 11/07/2015   Superior semicircular canal dehiscence, 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 [place], [place], [address] Phone: [phone]   Fax:  [phone]  Name: [name] MRN: [id number] Date of Birth: [birth year]

## 2021-11-10 NOTE — Telephone Encounter (Signed)
Clarify she has been taking lasix 40mg  daily. If so increase to 60 mg daily. Increase her potassium to 52mEq daily. Needs bmet/mg/pro bnp in 2 weeks   J Sunil Hue MD

## 2021-11-10 NOTE — Telephone Encounter (Signed)
Can we schedule her for thora with me tomorrow at 12pm? If that won't work then Wednesday at 12pm?

## 2021-11-10 NOTE — ED Provider Notes (Signed)
[facility] Provider Note   CSN: [id number] Arrival date & time: 11/10/21  1434     History  No chief complaint on file.   [name] is a 71 y.o. female.  Patient had a trip and fall today.  She was try to get out of the vehicle when she lost her balance and fell.  Complaining of lower back pain.  She states that she scraped her head on the ground as well.  She is not quite sure if she passed out as she thinks everything happened too fast.  Nurse and caregivers were there and they did not note any loss of consciousness.  Patient states that she uses home oxygen at baseline, has a history of pleural effusion on the right side.  Denies any fevers or cough or vomiting or diarrhea.  Complaining of shortness of breath.      Home Medications Prior to Admission medications   Medication Sig Start Date End Date Taking? Authorizing Provider  albuterol (VENTOLIN HFA) 108 (90 Base) MCG/ACT inhaler Inhale 2 puffs into the lungs in the morning, at noon, in the evening, and at bedtime. 09/22/21   [provider]  calcitRIOL (ROCALTROL) 0.5 MCG capsule Take 0.5 mcg by mouth daily. 08/30/16   [provider]  Cholecalciferol (CVS D3) 125 MCG (5000 UT) capsule Take 1 tablet by mouth daily.    [provider]  denosumab (PROLIA) 60 MG/ML SOSY injection Inject 60 mg into the skin every 6 (six) months.    [provider]  diltiazem (CARDIZEM) 30 MG tablet Take 1 tablet (30 mg total) by mouth 2 (two) times daily. 10/13/21   Arnoldo Lenis, MD  furosemide (LASIX) 40 MG tablet Take 1 tablet (40 mg total) by mouth daily. 10/20/21   Cobb, Karie Schwalbe, NP  levothyroxine (SYNTHROID) 25 MCG tablet Take 25 mcg by mouth every morning. 10/21/21   [provider]  Melatonin 10 MG TABS Take 1 tablet by mouth as needed.    [provider]  Multiple Vitamin (M.V.I. ADULT IV) Take 1 tablet by mouth daily.    [provider]   pantoprazole (PROTONIX) 20 MG tablet Take 1 tablet (20 mg total) by mouth daily. 04/03/21   Jonathon Bellows, MD  potassium chloride SA (KLOR-CON M) 20 MEQ tablet Take 1 tablet (20 mEq total) by mouth daily. 10/20/21   Cobb, Karie Schwalbe, NP  Vitamin A 2400 MCG (8000 UT) CAPS Take 1 capsule by mouth daily.    [provider]  Vitamin E 180 MG (400 UNIT) CAPS Take 1 capsule by mouth daily.    [provider]      Allergies    Zoledronic acid, Azithromycin, Lac bovis, Prednisone, Sulfa antibiotics, and Tape    Review of Systems   Review of Systems  Constitutional:  Negative for fever.  HENT:  Negative for ear pain.   Eyes:  Negative for pain.  Respiratory:  Positive for shortness of breath. Negative for cough.   Cardiovascular:  Negative for chest pain.  Gastrointestinal:  Negative for abdominal pain.  Genitourinary:  Negative for flank pain.  Musculoskeletal:  Positive for back pain.  Skin:  Negative for rash.  Neurological:  Negative for headaches.   Physical Exam Updated Vital Signs BP (!) 111/96    Pulse 82    Resp 15    SpO2 100%  Physical Exam Constitutional:      General: She is not in acute distress.  Appearance: Normal appearance.  HENT:     Head: Normocephalic.     Nose: Nose normal.  Eyes:     Extraocular Movements: Extraocular movements intact.  Cardiovascular:     Rate and Rhythm: Normal rate.  Pulmonary:     Effort: Pulmonary effort is normal.     Breath sounds: Normal breath sounds. No wheezing.  Musculoskeletal:        General: Normal range of motion.     Cervical back: Normal range of motion.  Neurological:     General: No focal deficit present.     Mental Status: She is alert. Mental status is at baseline.    ED Results / Procedures / Treatments   Labs (all labs ordered are listed, but only abnormal results are displayed) Labs Reviewed  CBC WITH DIFFERENTIAL/PLATELET - Abnormal; Notable for the following components:      Result Value    RDW 16.0 (*)    All other components within normal limits  COMPREHENSIVE METABOLIC PANEL - Abnormal; Notable for the following components:   Calcium 7.3 (*)    Total Protein 4.7 (*)    Albumin 2.0 (*)    AST 57 (*)    All other components within normal limits  BRAIN NATRIURETIC PEPTIDE - Abnormal; Notable for the following components:   B Natriuretic Peptide 103.0 (*)    All other components within normal limits  RESP PANEL BY RT-PCR (FLU A&B, COVID) [id number]  TROPONIN I (HIGH SENSITIVITY)  TROPONIN I (HIGH SENSITIVITY)    EKG EKG Interpretation  Date/Time:  Monday November 10 2021 14:57:41 EST Ventricular Rate:  74 PR Interval:  114 QRS Duration: 101 QT Interval:  384 QTC Calculation: 426 R Axis:   65 Text Interpretation: Sinus rhythm Borderline short PR interval Low voltage, extremity and precordial leads Confirmed by Thamas Jaegers ([id number]) on 11/10/2021 6:06:31 PM  Radiology CT Head Wo Contrast  Result Date: 11/10/2021 CLINICAL DATA:  Unwitnessed fall. EXAM: CT HEAD WITHOUT CONTRAST CT CERVICAL SPINE WITHOUT CONTRAST TECHNIQUE: Multidetector CT imaging of the head and cervical spine was performed following the standard protocol without intravenous contrast. Multiplanar CT image reconstructions of the cervical spine were also generated. RADIATION DOSE REDUCTION: This exam was performed according to the departmental dose-optimization program which includes automated exposure control, adjustment of the mA and/or kV according to patient size and/or use of iterative reconstruction technique. COMPARISON:  None. FINDINGS: CT HEAD FINDINGS Brain: No evidence of acute infarction, hemorrhage, hydrocephalus, extra-axial collection or mass lesion/mass effect. Vascular: No hyperdense vessel or unexpected calcification. Skull: Normal. Negative for fracture or focal lesion. Sinuses/Orbits: No acute finding. Other: None. CT CERVICAL SPINE FINDINGS Alignment: Minimal grade 1 retrolisthesis of C4-5 is  noted secondary to mild degenerative disc disease at this level. Skull base and vertebrae: No acute fracture. No primary bone lesion or focal pathologic process. Soft tissues and spinal canal: No prevertebral fluid or swelling. No visible canal hematoma. Disc levels: Severe degenerative disc disease is noted at C3-4. Moderate degenerative disc disease is noted at C5-6 and C6-7. Upper chest: Negative. Other: None. IMPRESSION: No acute intracranial abnormality seen. 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 No acute abnormality is noted. Electronically Signed   By: Marijo Conception M.D.   On: 11/10/2021 16:30   CT Cervical Spine Wo Contrast  Result Date: 11/10/2021 CLINICAL DATA:  Unwitnessed fall. EXAM: CT HEAD WITHOUT CONTRAST CT CERVICAL SPINE WITHOUT CONTRAST TECHNIQUE: Multidetector CT imaging of the head and  cervical spine was performed following the standard protocol without intravenous contrast. Multiplanar CT image reconstructions of the cervical spine were also generated. RADIATION DOSE REDUCTION: This exam was performed according to the departmental dose-optimization program which includes automated exposure control, adjustment of the mA and/or kV according to patient size and/or use of iterative reconstruction technique. COMPARISON:  None. FINDINGS: CT HEAD FINDINGS Brain: No evidence of acute infarction, hemorrhage, hydrocephalus, extra-axial collection or mass lesion/mass effect. Vascular: No hyperdense vessel or unexpected calcification. Skull: Normal. Negative for fracture or focal lesion. Sinuses/Orbits: No acute finding. Other: None. CT CERVICAL SPINE FINDINGS Alignment: Minimal grade 1 retrolisthesis of C4-5 is noted secondary to mild degenerative disc disease at this level. Skull base and vertebrae: No acute fracture. No primary bone lesion or focal pathologic process. Soft tissues and spinal canal: No prevertebral fluid or swelling. No visible canal  hematoma. Disc levels: Severe degenerative disc disease is noted at C3-4. Moderate degenerative disc disease is noted at C5-6 and C6-7. Upper chest: Negative. Other: None. IMPRESSION: No acute intracranial abnormality seen. 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 No acute abnormality is noted. Electronically Signed   By: Marijo Conception M.D.   On: 11/10/2021 16:30   CT Lumbar Spine Wo Contrast  Result Date: 11/10/2021 CLINICAL DATA:  Back trauma.  Found down. EXAM: CT LUMBAR SPINE WITHOUT CONTRAST TECHNIQUE: Multidetector CT imaging of the lumbar spine was performed without intravenous contrast administration. Multiplanar CT image reconstructions were also generated. RADIATION DOSE REDUCTION: This exam was performed according to the departmental dose-optimization program which includes automated exposure control, adjustment of the mA and/or kV according to patient size and/or use of iterative reconstruction technique. COMPARISON:  CT abdomen and pelvis 10/17/2021. MRI lumbar spine 08/05/2015. FINDINGS: Segmentation: There is transitional lumbosacral anatomy. For consistency with the prior MRI report, the transitional segment will be considered a partially sacralized L5. Alignment: Mild lumbar dextroscoliosis. Exaggerated lumbar lordosis. No listhesis. Vertebrae: Prior augmentation of T12 and L4 compression fractures. Mild chronic appearing compression deformities of the L2 and L3 vertebral bodies, unchanged from the 10/17/2021 CT. No definite acute fracture. Paraspinal and other soft tissues: Partially visualized bilateral pleural effusions. Small volume ascites. Hepatic steatosis. Cholecystectomy. Unchanged 12 mm nonobstructing right renal calculus. Left renal sinus cysts. Abdominal aortic atherosclerosis without aneurysm. Diffuse body wall edema. Small calcified uterine fibroid. Partially visualized cystic focus in the left adnexa, more fully evaluated on the prior  abdominopelvic CT. Disc levels: Mild lumbar spondylosis. Diffuse lumbar facet hypertrophy with note of severe facet arthrosis on the right at L3-4 and bilaterally at L4-5. No high-grade stenosis. IMPRESSION: 1. Chronic compression fractures without an acute osseous abnormality identified. 2. Anasarca with bilateral pleural effusions and small volume ascites. 3. Hepatic steatosis. 4. Nonobstructing right renal calculus. 5. Aortic Atherosclerosis (ICD10-I70.0). Electronically Signed   By: Logan Bores M.D.   On: 11/10/2021 16:40   DG Chest Port 1 View  Result Date: 11/10/2021 CLINICAL DATA:  Fall, pleural effusion EXAM: PORTABLE CHEST 1 VIEW COMPARISON:  Chest x-ray 11/04/2021 FINDINGS: Heart size is normal. Mediastinum appears stable. Pulmonary vasculature within normal limits. Stable to slightly decreased small to moderate right pleural effusion with associated atelectasis/infiltrate. No new consolidation identified. No pneumothorax. Bones are osteopenic. IMPRESSION: No significant change since previous study. Electronically Signed   By: Ofilia Neas M.D.   On: 11/10/2021 15:27    Procedures Procedures    Medications Ordered in ED Medications  bacitracin ointment (has no administration  in time range)  morphine (PF) 4 MG/ML injection 2 mg (2 mg Intravenous Given 11/10/21 1522)    ED Course/ Medical Decision Making/ A&P                           Medical Decision Making Amount and/or Complexity of Data Reviewed Labs: ordered. Radiology: ordered.  Risk OTC drugs. Prescription drug management.   Patient placed on telemetry.  Sinus rhythm, normal rate.  Chart review shows possible planned thoracentesis tomorrow or on Wednesday.  On arrival patient was on 15 L nonrebreather.  She was titrated down to 5 L and then ultimately to 2 L nasal cannula.  Work-up otherwise unremarkable proBNP normal white count normal chemistry normal.  Chest x-ray shows persistent right-sided pleural effusion  moderate size.  CT imaging otherwise unremarkable for acute injury.  Patient doing well on her baseline oxygen requirements.  Advised her to follow-up with her pulmonologist for thoracentesis either tomorrow or on the Wednesday in 2 days.  Advised immediate return if she has fevers difficulty breathing or any additional concerns.        Final Clinical Impression(s) / ED Diagnoses Final diagnoses:  Fall, initial encounter  Pleural effusion    Rx / DC Orders ED Discharge Orders     None         Luna Fuse, MD 11/10/21 [phone]

## 2021-11-10 NOTE — Telephone Encounter (Signed)
It says she is currently admitted to [facility] ER. Please advise.

## 2021-11-10 NOTE — Discharge Instructions (Addendum)
I have sent a message through the electronic chart system to your pulmonologist.  However call them tomorrow in case a message is missed.  Per their notes, they would like to repeat removal of fluid from your right lung either Tuesday the 28th or on Wednesday.   Return immediately back to the ER if:  Your symptoms worsen within the next 12-24 hours. You develop new symptoms such as new fevers, persistent vomiting, new pain, shortness of breath, or new weakness or numbness, or if you have any other concerns.

## 2021-11-10 NOTE — Telephone Encounter (Signed)
[facility]  [phone]  Needing to speak with Dr. Harl Bowie in regards to this pt

## 2021-11-11 ENCOUNTER — Telehealth: Payer: Self-pay | Admitting: Cardiology

## 2021-11-11 MED ORDER — FUROSEMIDE 40 MG PO TABS: 60.0000 mg | ORAL_TABLET | Freq: Every day | ORAL | Status: DC

## 2021-11-11 MED ORDER — POTASSIUM CHLORIDE CRYS ER 20 MEQ PO TBCR: 40.0000 meq | EXTENDED_RELEASE_TABLET | Freq: Every day | ORAL | Status: DC

## 2021-11-11 NOTE — Telephone Encounter (Signed)
[name], [name] 46 minutes ago (8:43 AM)   KJ Pt is returning a phone call.. please advise   Will be leaving home in about an hour.. ok to leave a detailed message

## 2021-11-11 NOTE — Telephone Encounter (Signed)
Patient called and said she would like to come in at 12pm on 11/12/21 for appt with Dr. Verlee Monte

## 2021-11-11 NOTE — Telephone Encounter (Signed)
Patient notified and verbalized understanding.  She will increase her Lasix to 60mg  daily & increase her Potassium to 44meq as well.  Labs orders mailed to patient's home.  She will do at [facility] around 11/25/2021.

## 2021-11-11 NOTE — Progress Notes (Signed)
Synopsis: Referred for dyspnea by Practice, [facility]  Subjective:   PATIENT ID: [name] GENDER: female DOB: Sep 08, 1951, MRN: [id number]  Chief Complaint  Patient presents with   Follow-up    Pt c/o increased SOB since 11/10/21- she also had a fall same day but increased SOB started before the fall.    70yF with history of lymphedema, diastolic dysfunction and mildly reduced EF, gastric bypass 2003, secondary hyperparathyroidism, GERD, SVT, IDA, covid-19 infection 12/2019 and 02/2021  Underwent right thoracentesis 10/15/21 1L drained. Cyto/cx negative. Transudate. She says she did feel better after thoracentesis.   She says she feels bad. Very short of breath and weak, orthopneic. Some cough with chest congestion but not coughing anything up. No fever. She says she has normal diarrhea - sort of gold looking/yellow.   No family history of lung cancer  Interval HPI: Went to [facility] ED 2/27 after mechanical fall. Earlier that day she had reached out that she thought she had felt symptomatic again from an effusion standpoint and I offered to set up follow up appointment with [place] for thoracentesis.  Doing a little better from dyspnea standpoint than on day of trip to ED but still having orthopnea. She thinks she's maybe just a little better than at time of last clinic visit when we did thora.  Hasn't been using lasix she says because it makes her feel weak, dizzy although it doesn't seem to make her urinate any more frequently than normal. Dose was increased by cardiology to lasix 60 daily which she says she hasn't tried.   Otherwise pertinent review of systems is negative.  Past Medical History:  Diagnosis Date   Asthma    CHF (congestive heart failure) (HCC)    GERD (gastroesophageal reflux disease)    Internal hemorrhoids    Iron deficiency anemia    Osteoporosis    Peptic ulcer    Tubular adenoma of colon    Vertigo      Family History  Problem Relation Age of Onset    Hypertension Other    Stroke Other    Diabetes Other    Heart attack Other    Obesity Other    Diabetes Father    Heart disease Father    Colon cancer Father    Breast cancer Paternal Grandmother    Healthy Daughter    Stomach cancer Neg Hx    Pancreatic cancer Neg Hx      Past Surgical History:  Procedure Laterality Date   BACK SURGERY     BREAST SURGERY     CHOLECYSTECTOMY     COLONOSCOPY WITH PROPOFOL N/A 06/02/2017   Procedure: COLONOSCOPY WITH PROPOFOL;  Surgeon: Jonathon Bellows, MD;  Location[facility] ENDOSCOPY;  Service: Gastroenterology;  Laterality: N/A;   COLONOSCOPY WITH PROPOFOL N/A 01/28/2021   Procedure: COLONOSCOPY WITH PROPOFOL;  Surgeon: Jonathon Bellows, MD;  Location: [facility] ENDOSCOPY;  Service: Gastroenterology;  Laterality: N/A;   ESOPHAGOGASTRODUODENOSCOPY (EGD) WITH PROPOFOL N/A 07/01/2017   Procedure: ESOPHAGOGASTRODUODENOSCOPY (EGD) WITH PROPOFOL;  Surgeon: Jonathon Bellows, MD;  Location: [facility] ENDOSCOPY;  Service: Gastroenterology;  Laterality: N/A;   ESOPHAGOGASTRODUODENOSCOPY (EGD) WITH PROPOFOL N/A 01/28/2021   Procedure: ESOPHAGOGASTRODUODENOSCOPY (EGD) WITH PROPOFOL;  Surgeon: Jonathon Bellows, MD;  Location: [facility] ENDOSCOPY;  Service: Gastroenterology;  Laterality: N/A;   FLEXIBLE SIGMOIDOSCOPY N/A 07/01/2017   Procedure: FLEXIBLE SIGMOIDOSCOPY;  Surgeon: Jonathon Bellows, MD;  Location: [facility] ENDOSCOPY;  Service: Gastroenterology;  Laterality: N/A;   KNEE SURGERY     mini gastric bypass  SHOULDER SURGERY     TONSILLECTOMY      Social History   Socioeconomic History   Marital status: Married    Spouse name: Not on file   Number of children: Not on file   Years of education: Not on file   Highest education level: Not on file  Occupational History   Not on file  Tobacco Use   Smoking status: Never   Smokeless tobacco: Never  Vaping Use   Vaping Use: Never used  Substance and Sexual Activity   Alcohol use: No    Alcohol/week: 0.0 standard drinks   Drug use: Never    Sexual activity: Never  Other Topics Concern   Not on file  Social History Narrative   Not on file   Social Determinants of Health   Financial Resource Strain: Not on file  Food Insecurity: Not on file  Transportation Needs: Not on file  Physical Activity: Not on file  Stress: Not on file  Social Connections: Not on file  Intimate Partner Violence: Not on file     Allergies  Allergen Reactions   Zoledronic Acid Other (See Comments)    Other reaction(s): fever and flu-like symptoms   Azithromycin Diarrhea    Other reaction(s): diarrhea   Lac Bovis Diarrhea   Prednisone Diarrhea   Sulfa Antibiotics Itching   Tape Dermatitis     Outpatient Medications Prior to Visit  Medication Sig Dispense Refill   albuterol (VENTOLIN HFA) 108 (90 Base) MCG/ACT inhaler Inhale 2 puffs into the lungs in the morning, at noon, in the evening, and at bedtime.     calcitRIOL (ROCALTROL) 0.5 MCG capsule Take 0.5 mcg by mouth daily.  5   Cholecalciferol (CVS D3) 125 MCG (5000 UT) capsule Take 1 tablet by mouth daily.     denosumab (PROLIA) 60 MG/ML SOSY injection Inject 60 mg into the skin every 6 (six) months.     diltiazem (CARDIZEM) 30 MG tablet Take 1 tablet (30 mg total) by mouth 2 (two) times daily. 60 tablet 2   furosemide (LASIX) 40 MG tablet Take 1.5 tablets (60 mg total) by mouth daily.     levothyroxine (SYNTHROID) 25 MCG tablet Take 25 mcg by mouth every morning.     Melatonin 10 MG TABS Take 1 tablet by mouth as needed.     Multiple Vitamin (M.V.I. ADULT IV) Take 1 tablet by mouth daily.     pantoprazole (PROTONIX) 20 MG tablet Take 1 tablet (20 mg total) by mouth daily. 90 tablet 3   potassium chloride SA (KLOR-CON M) 20 MEQ tablet Take 2 tablets (40 mEq total) by mouth daily.     Vitamin A 2400 MCG (8000 UT) CAPS Take 1 capsule by mouth daily.     Vitamin E 180 MG (400 UNIT) CAPS Take 1 capsule by mouth daily.     Facility-Administered Medications Prior to Visit  Medication Dose  Route Frequency Provider Last Rate Last Admin   albumin human 25 % solution 25 g  25 g Intravenous Once Jonathon Bellows, MD           Objective:   Physical Exam:  General appearance: 71 y.o., female, chronically ill appearing Eyes: PERRL, tracking appropriately HENT: NCAT; MMM Neck: Trachea midline; no lymphadenopathy, no JVD Lungs: diminished on right, no crackles, no wheeze, with normal respiratory effort CV: tachy, RR, no murmur  Abdomen: Soft, non-tender; non-distended, BS present  Extremities: 2+ BLE edema, warm Skin: Normal turgor and texture; no rash Psych:  Appropriate affect Neuro: Alert and oriented to person and place, no focal deficit     Vitals:   11/12/21 1208  BP: 108/70  Pulse: 89  Temp: 97.7 F (36.5 C)  TempSrc: Oral  SpO2: 99%    99% on RA BMI Readings from Last 3 Encounters:  11/05/21 29.52 kg/m  10/20/21 29.08 kg/m  10/14/21 28.49 kg/m   Wt Readings from Last 3 Encounters:  11/05/21 172 lb (78 kg)  10/20/21 169 lb 6.4 oz (76.8 kg)  10/14/21 166 lb (75.3 kg)     CBC    Component Value Date/Time   WBC 5.4 11/10/2021 1512   RBC 4.02 11/10/2021 1512   HGB 12.6 11/10/2021 1512   HGB 10.8 (L) 09/25/2016 1518   HCT 39.5 11/10/2021 1512   HCT 32.6 (L) 09/25/2016 1518   PLT 227 11/10/2021 1512   PLT 264 09/25/2016 1518   MCV 98.3 11/10/2021 1512   MCV 96 09/25/2016 1518   MCH 31.3 11/10/2021 1512   MCHC 31.9 11/10/2021 1512   RDW 16.0 (H) 11/10/2021 1512   RDW 15.5 (H) 09/25/2016 1518   LYMPHSABS 1.0 11/10/2021 1512   LYMPHSABS 1.2 09/25/2016 1518   MONOABS 0.5 11/10/2021 1512   EOSABS 0.0 11/10/2021 1512   EOSABS 0.1 09/25/2016 1518   BASOSABS 0.0 11/10/2021 1512   BASOSABS 0.0 09/25/2016 1518    Chest Imaging:   CXR 10/20/21 reviewed by me with slight increase in volume of right effusion  CT A/P lung bases 10/17/21 reviewed by me remarkable for  and L effusion, anterior RLL atelectasis more likely than consolidation/pneumonia.  Diffuse small bowel wall thickening and mucosal hyperenhancement. Anasarca. Severe hepatic steatosis.  Pulmonary Functions Testing Results: No flowsheet data found.    Echocardiogram:   TTE 07/25/22:   1. Technically difficult study.    2. The left ventricle is upper normal in size with normal wall thickness.    3. The left ventricular systolic function is mildly decreased, LVEF is  visually estimated at 45-50%.    4. There is grade I diastolic dysfunction (impaired relaxation).    5. There is mild mitral valve regurgitation.    6. There is mild aortic regurgitation.    7. The left atrium is moderately dilated in size.    8. The right ventricle is normal in size, with normal systolic function.   Assessment & Plan:   # Recurrent right pleural effusion: # Hypoalbuminemia # Chronic diastolic heart failure Transudative. Probably multifactorial in setting hypoalbuminemia due to ?protein-losing enteropathy (longstanding diarrhea with gold color raises concern for malabsoprtion) and/or impaired protein synthesis in setting severe hepatic steatosis with or without cirrhosis - less likely, diastolic dysfunction. Per GI notes she has had low albumin ascites in past which could reflect effect of portal hypertension however agree that size of pleural effusion relative to ascites seems odd for hepatic hydrothorax. UA without significant proteinuria.  # history of asthma Seems quiescent. Majority of dyspnea seems to be tied to effusion.  Plan: - thoracentesis performed today, can perform again if needed for symptomatic relief if recurrent despite use of lasix - can hold off on PFTs for now - would hold off on lasix tomorrow - agree with recommended workup for protein losing enteropathy - repeat BNP at next visit given equivocal results and if elevated consideration of new TTE  - if continues to need frequent thoracentesis despite optimizing diuretic then could entertain PleurX while possible  protein-losing enteropathy worked up/managed  RTC later this month  Maryjane Hurter, MD [facility] 11/12/2021 1:21 PM

## 2021-11-11 NOTE — Telephone Encounter (Signed)
Pt is returning a phone call.. please advise  Will be leaving home in about an hour.. ok to leave a detailed message

## 2021-11-12 ENCOUNTER — Other Ambulatory Visit: Payer: Self-pay

## 2021-11-12 ENCOUNTER — Other Ambulatory Visit (HOSPITAL_COMMUNITY): Payer: Self-pay | Admitting: Orthopedic Surgery

## 2021-11-12 ENCOUNTER — Ambulatory Visit (INDEPENDENT_AMBULATORY_CARE_PROVIDER_SITE_OTHER): Payer: Medicare Other | Admitting: Student

## 2021-11-12 ENCOUNTER — Telehealth: Payer: Self-pay | Admitting: Gastroenterology

## 2021-11-12 ENCOUNTER — Encounter: Payer: Self-pay | Admitting: Student

## 2021-11-12 ENCOUNTER — Other Ambulatory Visit: Payer: Self-pay | Admitting: Orthopedic Surgery

## 2021-11-12 ENCOUNTER — Encounter (HOSPITAL_COMMUNITY): Payer: Medicare Other

## 2021-11-12 VITALS — BP 108/70 | HR 89 | Temp 97.7°F

## 2021-11-12 DIAGNOSIS — J90 Pleural effusion, not elsewhere classified: Secondary | ICD-10-CM | POA: Diagnosis not present

## 2021-11-12 DIAGNOSIS — R0600 Dyspnea, unspecified: Secondary | ICD-10-CM | POA: Diagnosis not present

## 2021-11-12 DIAGNOSIS — S82001A Unspecified fracture of right patella, initial encounter for closed fracture: Secondary | ICD-10-CM

## 2021-11-12 NOTE — Telephone Encounter (Signed)
Ivin Booty from [facility] called from [facility] office. DR of that office is requesting to speak with DR Vicente Males regarding this patient.  ?Call DR at : [phone] ?Ivin Booty is requesting a call back to let her know that this has been done.  Ivin Booty #: [phone] ?

## 2021-11-12 NOTE — Telephone Encounter (Signed)
Spoke with Dr Manuella Ghazi, feels less likely the pleural effusions are related to her liver. Recommended EGD+colonoscopy with biopsies to rule out any condition that causes protein loss, please schedule if ok from patient side.

## 2021-11-12 NOTE — Patient Instructions (Addendum)
-   If we're needing to do thoracentesis every week or so we should think about whether or not we should place PleurX tunneled pleural catheter to allow you to drain fluid at home.  ?- see you later this month ? ? ?

## 2021-11-12 NOTE — Progress Notes (Signed)
Thoracentesis  Procedure Note ? ?[name]  ?[id number]  ?August 06, 1951 ? ?Date:11/12/21  ?Time:1:36 PM  ? ?Provider Performing:Denzel Etienne Merlene Pulling  ? ?Procedure: Thoracentesis with imaging guidance (79150) ? ?Indication(s) ?Pleural Effusion ? ?Consent ?Risks of the procedure as well as the alternatives and risks of each were explained to the patient and/or caregiver.  Consent for the procedure was obtained and is signed in the bedside chart ? ?Anesthesia ?Topical only with 1% lidocaine  ? ? ?Time Out ?Verified patient identification, verified procedure, site/side was marked, verified correct patient position, special equipment/implants available, medications/allergies/relevant history reviewed, required imaging and test results available. ? ? ?Sterile Technique ?Maximal sterile technique including full sterile barrier drape, hand hygiene, sterile gown, sterile gloves, mask, hair covering, sterile ultrasound probe cover (if used). ? ?Procedure Description ?Ultrasound was used to identify appropriate pleural anatomy for placement and overlying skin marked.  Area of drainage cleaned and draped in sterile fashion. Lidocaine was used to anesthetize the skin and subcutaneous tissue.  750 cc's of straw appearing fluid was drained from the right pleural space. Catheter then removed and bandaid applied to site. ? ? ? ? ?Complications/Tolerance ?None; patient tolerated the procedure well. ?Chest X-ray not needed, post-procedural [place] without evidence of pneumothorax or significant residual effusion. ? ? ?EBL ?Minimal ? ? ?Specimen(s) ?Pleural fluid ? ? ? ? ? ? ? ? ?  ?

## 2021-11-13 ENCOUNTER — Other Ambulatory Visit: Payer: Self-pay

## 2021-11-13 DIAGNOSIS — E46 Unspecified protein-calorie malnutrition: Secondary | ICD-10-CM

## 2021-11-13 MED ORDER — NA SULFATE-K SULFATE-MG SULF 17.5-3.13-1.6 GM/177ML PO SOLN: 354.0000 mL | Freq: Once | ORAL | 0 refills | Status: AC

## 2021-11-13 NOTE — Telephone Encounter (Signed)
I called [name] from [facility] and left her a voicemail letting her know that Dr. Vicente Males was able to speak with Dr. Manuella Ghazi and that they both agreed on patient getting an EGD and Colonoscopy to obtain biopsies and hopefully letting them know what causes her protein loss.  ?I then called patient to let her know what had been recommended and she agreed. However, she stated that she fell this Monday 11/10/2021 and hurt her knee. Therefore, even though she agrees with the physicians, she wants to do the procedures until the end of the month of March. She scheduled her procedures until 12/09/2021. The new instructions will be sent through [place] per patient's request. ?

## 2021-11-14 ENCOUNTER — Ambulatory Visit (HOSPITAL_COMMUNITY): Payer: Medicare Other | Admitting: Physical Therapy

## 2021-11-17 ENCOUNTER — Ambulatory Visit (HOSPITAL_COMMUNITY)
Admission: RE | Admit: 2021-11-17 | Discharge: 2021-11-17 | Disposition: A | Discharge status: Home / Self Care | Payer: Medicare Other | Source: Ambulatory Visit | Attending: Orthopedic Surgery | Admitting: Orthopedic Surgery

## 2021-11-17 ENCOUNTER — Other Ambulatory Visit: Payer: Self-pay

## 2021-11-17 DIAGNOSIS — S82001A Unspecified fracture of right patella, initial encounter for closed fracture: Secondary | ICD-10-CM | POA: Insufficient documentation

## 2021-11-18 ENCOUNTER — Encounter (HOSPITAL_COMMUNITY): Payer: Medicare Other | Admitting: Physical Therapy

## 2021-11-19 ENCOUNTER — Encounter (HOSPITAL_COMMUNITY): Payer: Medicare Other

## 2021-11-21 ENCOUNTER — Encounter (HOSPITAL_COMMUNITY): Payer: Medicare Other

## 2021-11-24 ENCOUNTER — Encounter: Payer: Self-pay | Admitting: Student

## 2021-11-24 ENCOUNTER — Encounter (HOSPITAL_COMMUNITY): Payer: Medicare Other | Admitting: Physical Therapy

## 2021-11-25 ENCOUNTER — Telehealth (HOSPITAL_COMMUNITY): Payer: Self-pay | Admitting: Physical Therapy

## 2021-11-25 NOTE — Telephone Encounter (Signed)
Spoke with patient regarding continuation of treatment. Pt reports she continues to accumulate fluid in her abdomen requiring removal every 1-2 weeks.  MD would like to place a catheter in her lungs to help remove this.  Pt also discovered she has a tib plateau fx following orthopedic follow up/CT scan order.  Pt states between the lymph specialist in [place] and the ortho MD they request she discontinue lymph treatment at this time.  Discussed compression options and instructed she could try the juxtafit as these are easier to get on and will maintain her LE's.  Suggested she hold off on using the pump at this time until they can get her fluid controlled building up in her chest/abdomen.  Scanned juxta order faxed to [place] and evaluating PT informed of discharge. ? ?Teena Irani, PTA/CLT, WTA ?[phone] ? ?

## 2021-11-26 ENCOUNTER — Ambulatory Visit (HOSPITAL_COMMUNITY): Payer: Medicare Other | Admitting: Physical Therapy

## 2021-11-26 ENCOUNTER — Other Ambulatory Visit: Payer: Self-pay

## 2021-11-26 ENCOUNTER — Encounter (HOSPITAL_COMMUNITY): Payer: Self-pay | Admitting: Physical Therapy

## 2021-11-26 ENCOUNTER — Encounter: Payer: Self-pay | Admitting: Cardiology

## 2021-11-26 NOTE — Telephone Encounter (Signed)
If off fluid pill can hold off on doing labs. Ok to stay off fluid pill for time being. Needs to check her weights and update [place] if significant increase or if marked increase in edema. May need a very low dose fluid pill every few days eventually.  ? ? ?Zandra Abts MD ?

## 2021-11-26 NOTE — Therapy (Unsigned)
[facility] ?[address] ?[place], [place], [address] ?Phone: [phone]   Fax:  [phone] ? ?Patient Details  ?Name: [name] ?MRN: [id number] ?Date of Birth: 25-Apr-1951 ?Referring Provider:  No ref. provider found ? ?Encounter Date: 11/26/2021 ? ? ?PHYSICAL THERAPY DISCHARGE SUMMARY ? ?Visits from Start of Care: 16 ? ?Current functional level related to goals / functional outcomes: ?Not met pt fell and had a fx MD states to stop therapy ?  ?Remaining deficits: ?Continued edema ?  ?Education / Equipment: ?Juxt to fit   ? ?Patient agrees to discharge. Patient goals were not met. Patient is being discharged due to the physician's request.  ?[name], PT CLT ?[phone]  ?11/26/2021, 2:45 PM ? ?[facility] ?[place] ?[address] ?[place], [place], [address] ?Phone: [phone]   Fax:  [phone] ?

## 2021-11-27 ENCOUNTER — Other Ambulatory Visit: Payer: Self-pay

## 2021-11-27 ENCOUNTER — Encounter (HOSPITAL_COMMUNITY): Payer: Medicare Other

## 2021-11-27 ENCOUNTER — Ambulatory Visit (INDEPENDENT_AMBULATORY_CARE_PROVIDER_SITE_OTHER): Payer: Medicare Other | Admitting: Pulmonary Disease

## 2021-11-27 ENCOUNTER — Ambulatory Visit (INDEPENDENT_AMBULATORY_CARE_PROVIDER_SITE_OTHER): Payer: Medicare Other

## 2021-11-27 ENCOUNTER — Encounter: Payer: Self-pay | Admitting: Pulmonary Disease

## 2021-11-27 VITALS — BP 108/68 | HR 95 | Ht 64.0 in

## 2021-11-27 DIAGNOSIS — R0600 Dyspnea, unspecified: Secondary | ICD-10-CM | POA: Diagnosis not present

## 2021-11-27 DIAGNOSIS — J90 Pleural effusion, not elsewhere classified: Secondary | ICD-10-CM

## 2021-11-27 DIAGNOSIS — I5042 Chronic combined systolic (congestive) and diastolic (congestive) heart failure: Secondary | ICD-10-CM

## 2021-11-27 DIAGNOSIS — E8809 Other disorders of plasma-protein metabolism, not elsewhere classified: Secondary | ICD-10-CM | POA: Diagnosis not present

## 2021-11-27 LAB — C-REACTIVE PROTEIN: CRP: 1.1 mg/dL (ref 0.5–20.0)

## 2021-11-27 LAB — SEDIMENTATION RATE: Sed Rate: 2 mm/hr (ref 0–30)

## 2021-11-27 MED ORDER — FUROSEMIDE 20 MG PO TABS: 20.0000 mg | ORAL_TABLET | Freq: Every day | ORAL | 0 refills | Status: DC

## 2021-11-27 NOTE — Patient Instructions (Addendum)
Resume lasix '20mg'$  daily and monitor your blood pressures daily.  ? ?I will talk with Dr. Verlee Monte about the next steps for your pleural effusion, but we will hold off on draining it today based on the x-ray.  ? ?Continue to increase your daily protein intake. - ? ? ?

## 2021-11-27 NOTE — Progress Notes (Signed)
? ?Synopsis: Referred in March 2023 for  ? ?Subjective:  ? ?PATIENT ID: [name] GENDER: female DOB: 1950/11/28, MRN: [id number] ? ?HPI ? ?Chief Complaint  ?Patient presents with  ? Acute Visit  ?  Feels like she has fluid around her lungs again. Increased SOB that started last Sunday. Productive cough with light yellow phlegm. Increased wheezing.   ? ?[name] is a 70 year old woman, never smoker with chronic diastolic heart failure, hypoalbuminemia, chronic diarrhea, hx of ascites and recurrent right pleural effusion who returns to pulmonary clinic for dyspnea.  ? ?She was seen in clinic on 11/05/21 where a thoracentesis was performed with 1534m of fluid removed. Patient returns to clinic today as she reports difficulty sleeping over the past couple of nights due to feeling short of breath and she feels that fluid is building back up around her lung.  ? ?She has been consuming protein drinks to improve her nutritional status. She continues to have loose stools frequently.  ? ?She was trialed on oral lasix therapy after the last thoracentesis but she reports this made her feel light headed and dizzy. She suffered a fall on 11/10/21 where she suffered a nondisplaced tibial plateau fracture and moderate sized knee joint hemarthrosis. She was not checking her blood pressures at home when she felt light headed. ? ?Past Medical History:  ?Diagnosis Date  ? Asthma   ? [place]   ? GERD (gastroesophageal reflux disease)   ? Internal hemorrhoids   ? Iron deficiency anemia   ? Osteoporosis   ? Peptic ulcer   ? Tubular adenoma of colon   ? Vertigo   ?  ? ?Family History  ?Problem Relation Age of Onset  ? Hypertension Other   ? Stroke Other   ? Diabetes Other   ? Heart attack Other   ? Obesity Other   ? Diabetes Father   ? Heart disease Father   ? Colon cancer Father   ? Breast cancer Paternal Grandmother   ? Healthy Daughter   ? Stomach cancer Neg Hx   ? Pancreatic cancer Neg Hx   ?   ? ?Social History  ? ?Socioeconomic History  ? Marital status: Married  ?  Spouse name: Not on file  ? Number of children: Not on file  ? Years of education: Not on file  ? Highest education level: Not on file  ?Occupational History  ? Not on file  ?Tobacco Use  ? Smoking status: Never  ? Smokeless tobacco: Never  ?Vaping Use  ? Vaping Use: Never used  ?Substance and Sexual Activity  ? Alcohol use: No  ?  Alcohol/week: 0.0 standard drinks  ? Drug use: Never  ? Sexual activity: Never  ?Other Topics Concern  ? Not on file  ?Social History Narrative  ? Not on file  ? ?Social Determinants of Health  ? ?Financial Resource Strain: Not on file  ?Food Insecurity: Not on file  ?Transportation Needs: Not on file  ?Physical Activity: Not on file  ?Stress: Not on file  ?Social Connections: Not on file  ?Intimate Partner Violence: Not on file  ?  ? ?Allergies  ?Allergen Reactions  ? Zoledronic Acid Other (See Comments)  ?  Other reaction(s): fever and flu-like symptoms  ? Azithromycin Diarrhea  ?  Other reaction(s): diarrhea  ? Lac Bovis Diarrhea  ? Prednisone Diarrhea  ? Sulfa Antibiotics Itching  ? Tape Dermatitis  ?  ? ?Outpatient Medications Prior to  Visit  ?Medication Sig Dispense Refill  ? albuterol (VENTOLIN HFA) 108 (90 Base) MCG/ACT inhaler Inhale 2 puffs into the lungs in the morning, at noon, in the evening, and at bedtime.    ? calcitRIOL (ROCALTROL) 0.5 MCG capsule Take 0.5 mcg by mouth daily.  5  ? Cholecalciferol (CVS D3) 125 MCG (5000 UT) capsule Take 1 tablet by mouth daily.    ? denosumab (PROLIA) 60 MG/ML SOSY injection Inject 60 mg into the skin every 6 (six) months.    ? diltiazem (CARDIZEM) 30 MG tablet Take 1 tablet (30 mg total) by mouth 2 (two) times daily. 60 tablet 2  ? levothyroxine (SYNTHROID) 25 MCG tablet Take 25 mcg by mouth every morning.    ? Melatonin 10 MG TABS Take 1 tablet by mouth as needed.    ? Multiple Vitamin (M.V.I. ADULT IV) Take 1 tablet by mouth daily.    ? pantoprazole (PROTONIX)  20 MG tablet Take 1 tablet (20 mg total) by mouth daily. 90 tablet 3  ? Vitamin A 2400 MCG (8000 UT) CAPS Take 1 capsule by mouth daily.    ? Vitamin E 180 MG (400 UNIT) CAPS Take 1 capsule by mouth daily.    ? furosemide (LASIX) 40 MG tablet Take 1.5 tablets (60 mg total) by mouth daily.    ? potassium chloride SA (KLOR-CON M) 20 MEQ tablet Take 2 tablets (40 mEq total) by mouth daily.    ? ?Facility-Administered Medications Prior to Visit  ?Medication Dose Route Frequency Provider Last Rate Last Admin  ? albumin human 25 % solution 25 g  25 g Intravenous Once Jonathon Bellows, MD      ? ? ?Review of Systems  ?Constitutional:  Negative for chills, fever, malaise/fatigue and weight loss.  ?HENT:  Negative for congestion, sinus pain and sore throat.   ?Eyes: Negative.   ?Respiratory:  Positive for shortness of breath. Negative for cough, hemoptysis, sputum production and wheezing.   ?Cardiovascular:  Negative for chest pain, palpitations, orthopnea, claudication and leg swelling.  ?Gastrointestinal:  Negative for abdominal pain, heartburn, nausea and vomiting.  ?Genitourinary: Negative.   ?Musculoskeletal:  Negative for joint pain and myalgias.  ?Skin:  Negative for rash.  ?Neurological:  Negative for weakness.  ?Endo/Heme/Allergies: Negative.   ?Psychiatric/Behavioral: Negative.    ? ? ? ?Objective:  ? ?Vitals:  ? 11/27/21 1025  ?BP: 108/68  ?Pulse: 95  ?SpO2: 97%  ?Height: '5\' 4"'$  (1.626 m)  ? ? ? ?Physical Exam ?Constitutional:   ?   General: She is not in acute distress. ?   Appearance: She is not ill-appearing.  ?   Comments: Elderly woman  ?HENT:  ?   Head: Normocephalic and atraumatic.  ?Eyes:  ?   General: No scleral icterus. ?   Conjunctiva/sclera: Conjunctivae normal.  ?   Pupils: Pupils are equal, round, and reactive to light.  ?Cardiovascular:  ?   Rate and Rhythm: Normal rate and regular rhythm.  ?   Pulses: Normal pulses.  ?   Heart sounds: Normal heart sounds. No murmur heard. ?Pulmonary:  ?   Effort:  Pulmonary effort is normal.  ?   Breath sounds: Decreased breath sounds present. No wheezing, rhonchi or rales.  ?Abdominal:  ?   General: Bowel sounds are normal.  ?   Palpations: Abdomen is soft.  ?Musculoskeletal:  ?   Right lower leg: No edema.  ?   Left lower leg: No edema.  ?Lymphadenopathy:  ?   Cervical:  No cervical adenopathy.  ?Skin: ?   General: Skin is warm and dry.  ?Neurological:  ?   General: No focal deficit present.  ?   Mental Status: She is alert.  ?Psychiatric:     ?   Mood and Affect: Mood normal.     ?   Behavior: Behavior normal.     ?   Thought Content: Thought content normal.     ?   Judgment: Judgment normal.  ? ? ? ? ?CBC ?   ?Component Value Date/Time  ? WBC 5.4 11/10/2021 1512  ? RBC 4.02 11/10/2021 1512  ? HGB 12.6 11/10/2021 1512  ? HGB 10.8 (L) 09/25/2016 1518  ? HCT 39.5 11/10/2021 1512  ? HCT 32.6 (L) 09/25/2016 1518  ? PLT 227 11/10/2021 1512  ? PLT 264 09/25/2016 1518  ? MCV 98.3 11/10/2021 1512  ? MCV 96 09/25/2016 1518  ? MCH 31.3 11/10/2021 1512  ? MCHC 31.9 11/10/2021 1512  ? RDW 16.0 (H) 11/10/2021 1512  ? RDW 15.5 (H) 09/25/2016 1518  ? LYMPHSABS 1.0 11/10/2021 1512  ? LYMPHSABS 1.2 09/25/2016 1518  ? MONOABS 0.5 11/10/2021 1512  ? EOSABS 0.0 11/10/2021 1512  ? EOSABS 0.1 09/25/2016 1518  ? BASOSABS 0.0 11/10/2021 1512  ? BASOSABS 0.0 09/25/2016 1518  ? ?BMP Latest Ref Rng & Units 11/10/2021 10/20/2021 10/07/2021  ?Glucose 70 - 99 mg/dL 92 82 78  ?BUN 8 - 23 mg/dL 11 9 7(L)  ?Creatinine 0.44 - 1.00 mg/dL 0.64 0.56 0.53(L)  ?BUN/Creat Ratio 12 - 28 - - 13  ?Sodium 135 - 145 mmol/L 141 143 142  ?Potassium 3.5 - 5.1 mmol/L 4.4 3.4(L) 3.9  ?Chloride 98 - 111 mmol/L 109 108 109(H)  ?CO2 22 - 32 mmol/L '24 28 21  '$ ?Calcium 8.9 - 10.3 mg/dL 7.3(L) 6.9(L) 6.7(LL)  ? ?Chest imaging: ?CXR 11/27/21 ?Bilateral pleural effusions, R >L.  ?The right effusion appears stable or less so prior films.  ? ?PFT: ?No flowsheet data found. ? ?Labs: ? ?Path: ? ?Echo 07/25/21: ?EF 40-45%. Grade I diastolic  dysfunction. Mild mitral valve regurg. Mitral aortic valve regurg. LA moderately dilated. RV normal size and function.  ? ?Heart Catheterization: ? ?Assessment & Plan:  ? ?Dyspnea, unspecified type ? ?Pleural

## 2021-11-28 ENCOUNTER — Other Ambulatory Visit: Payer: Medicare Other

## 2021-11-29 LAB — SJOGREN'S SYNDROME ANTIBODS(SSA + SSB)
SSA (Ro) (ENA) Antibody, IgG: <1 AI
SSB (La) (ENA) Antibody, IgG: <1 AI

## 2021-11-29 LAB — ANA: Anti Nuclear Antibody (ANA): NEGATIVE

## 2021-11-29 LAB — RHEUMATOID FACTOR: Rheumatoid fact SerPl-aCnc: <14 IU/mL (ref <14)

## 2021-11-29 LAB — CYCLIC CITRUL PEPTIDE ANTIBODY, IGG: Cyclic Citrullin Peptide Ab: <16 UNITS

## 2021-12-01 ENCOUNTER — Telehealth: Payer: Medicare Other | Admitting: Gastroenterology

## 2021-12-01 ENCOUNTER — Encounter: Payer: Self-pay | Admitting: Cardiology

## 2021-12-01 ENCOUNTER — Ambulatory Visit: Payer: Medicare Other | Admitting: Nurse Practitioner

## 2021-12-01 NOTE — Progress Notes (Signed)
? ?Synopsis: Referred for dyspnea by Practice, [facility]* ? ?Subjective:  ? ?PATIENT ID: [name] GENDER: female DOB: [birth year], MRN: [id number] ? ?Chief Complaint  ?Patient presents with  ? Follow-up  ?  Breathing is about the same since the last visit. She is taking her lasix 20 mg daily and feels that this is not helping with swelling.   ? ?71yF with history of lymphedema, diastolic dysfunction and mildly reduced EF, gastric bypass 2003, secondary hyperparathyroidism, GERD, SVT, IDA, covid-19 infection 12/2019 and 02/2021 ? ?Underwent right thoracentesis 10/15/21 1L drained. Cyto/cx negative. Transudate. She says she did feel better after thoracentesis.  ? ?She says she feels bad. Very short of breath and weak, orthopneic. Some cough with chest congestion but not coughing anything up. No fever. She says she has normal diarrhea - sort of gold looking/yellow.  ? ?No family history of lung cancer ? ?Interval HPI: ?Last thora 11/12/21. Restarted on lasix after visit with Dr. Erin Fulling 3/16. Upcoming enteroscopy 01/2022. ? ?Still feels dyspneic up when she's trying to go to the bathroom. Still orthopneic as well. She has some increased dyspnea since that visit. She is sleeping on her left side since her knee injury.  ? ?Anticipates appointment with lymphatic specialist at [facility].  ? ?Otherwise pertinent review of systems is negative. ? ?Past Medical History:  ?Diagnosis Date  ? Asthma   ? CHF (congestive heart failure) ([place])   ? GERD (gastroesophageal reflux disease)   ? Internal hemorrhoids   ? Iron deficiency anemia   ? Osteoporosis   ? Peptic ulcer   ? Tubular adenoma of colon   ? Vertigo   ?  ? ?Family History  ?Problem Relation Age of Onset  ? Hypertension Other   ? Stroke Other   ? Diabetes Other   ? Heart attack Other   ? Obesity Other   ? Diabetes Father   ? Heart disease Father   ? Colon cancer Father   ? Breast cancer Paternal Grandmother   ? Healthy Daughter   ? Stomach cancer Neg Hx   ? Pancreatic cancer  Neg Hx   ?  ? ?Past Surgical History:  ?Procedure Laterality Date  ? BACK SURGERY    ? BREAST SURGERY    ? CHOLECYSTECTOMY    ? COLONOSCOPY WITH PROPOFOL N/A 06/02/2017  ? Procedure: COLONOSCOPY WITH PROPOFOL;  Surgeon: Jonathon Bellows, MD;  Location: [facility] ENDOSCOPY;  Service: Gastroenterology;  Laterality: N/A;  ? COLONOSCOPY WITH PROPOFOL N/A 01/28/2021  ? Procedure: COLONOSCOPY WITH PROPOFOL;  Surgeon: Jonathon Bellows, MD;  Location: [facility] ENDOSCOPY;  Service: Gastroenterology;  Laterality: N/A;  ? ESOPHAGOGASTRODUODENOSCOPY (EGD) WITH PROPOFOL N/A 07/01/2017  ? Procedure: ESOPHAGOGASTRODUODENOSCOPY (EGD) WITH PROPOFOL;  Surgeon: Jonathon Bellows, MD;  Location: [facility] ENDOSCOPY;  Service: Gastroenterology;  Laterality: N/A;  ? ESOPHAGOGASTRODUODENOSCOPY (EGD) WITH PROPOFOL N/A 01/28/2021  ? Procedure: ESOPHAGOGASTRODUODENOSCOPY (EGD) WITH PROPOFOL;  Surgeon: Jonathon Bellows, MD;  Location: [facility] ENDOSCOPY;  Service: Gastroenterology;  Laterality: N/A;  ? FLEXIBLE SIGMOIDOSCOPY N/A 07/01/2017  ? Procedure: FLEXIBLE SIGMOIDOSCOPY;  Surgeon: Jonathon Bellows, MD;  Location: [facility] ENDOSCOPY;  Service: Gastroenterology;  Laterality: N/A;  ? KNEE SURGERY    ? mini gastric bypass    ? SHOULDER SURGERY    ? TONSILLECTOMY    ? ? ?Social History  ? ?Socioeconomic History  ? Marital status: Married  ?  Spouse name: Not on file  ? Number of children: Not on file  ? Years of education: Not on file  ? Highest education  level: Not on file  ?Occupational History  ? Not on file  ?Tobacco Use  ? Smoking status: Never  ? Smokeless tobacco: Never  ?Vaping Use  ? Vaping Use: Never used  ?Substance and Sexual Activity  ? Alcohol use: No  ?  Alcohol/week: 0.0 standard drinks  ? Drug use: Never  ? Sexual activity: Never  ?Other Topics Concern  ? Not on file  ?Social History Narrative  ? Not on file  ? ?Social Determinants of Health  ? ?Financial Resource Strain: Not on file  ?Food Insecurity: Not on file  ?Transportation Needs: Not on file  ?Physical Activity: Not  on file  ?Stress: Not on file  ?Social Connections: Not on file  ?Intimate Partner Violence: Not on file  ?  ? ?Allergies  ?Allergen Reactions  ? Zoledronic Acid Other (See Comments)  ?  Other reaction(s): fever and flu-like symptoms  ? Azithromycin Diarrhea  ?  Other reaction(s): diarrhea  ? Lac Bovis Diarrhea  ? Prednisone Diarrhea  ? Sulfa Antibiotics Itching  ? Tape Dermatitis  ?  ? ?Outpatient Medications Prior to Visit  ?Medication Sig Dispense Refill  ? albuterol (VENTOLIN HFA) 108 (90 Base) MCG/ACT inhaler Inhale 2 puffs into the lungs in the morning, at noon, in the evening, and at bedtime.    ? calcitRIOL (ROCALTROL) 0.5 MCG capsule Take 0.5 mcg by mouth daily.  5  ? Cholecalciferol (CVS D3) 125 MCG (5000 UT) capsule Take 1 tablet by mouth daily.    ? denosumab (PROLIA) 60 MG/ML SOSY injection Inject 60 mg into the skin every 6 (six) months.    ? diltiazem (CARDIZEM) 30 MG tablet Take 1 tablet (30 mg total) by mouth 2 (two) times daily. 60 tablet 2  ? furosemide (LASIX) 20 MG tablet Take 1 tablet (20 mg total) by mouth daily. 30 tablet 0  ? levothyroxine (SYNTHROID) 25 MCG tablet Take 25 mcg by mouth every morning.    ? Melatonin 10 MG TABS Take 1 tablet by mouth as needed.    ? Multiple Vitamin (M.V.I. ADULT IV) Take 1 tablet by mouth daily.    ? pantoprazole (PROTONIX) 20 MG tablet Take 1 tablet (20 mg total) by mouth daily. 90 tablet 3  ? Vitamin A 2400 MCG (8000 UT) CAPS Take 1 capsule by mouth daily.    ? Vitamin E 180 MG (400 UNIT) CAPS Take 1 capsule by mouth daily.    ? ?Facility-Administered Medications Prior to Visit  ?Medication Dose Route Frequency Provider Last Rate Last Admin  ? albumin human 25 % solution 25 g  25 g Intravenous Once Jonathon Bellows, MD      ? ? ? ? ? ?Objective:  ? ?Physical Exam: ? ?General appearance: 71 y.o., female, chronically ill appearing ?Eyes: PERRL, tracking appropriately ?HENT: NCAT; MMM ?Neck: Trachea midline; no lymphadenopathy, no JVD ?Lungs: diminished on right,  no crackles, no wheeze, with normal respiratory effort ?CV: tachy, RR, no murmur  ?Abdomen: Soft, non-tender; non-distended, BS present  ?Extremities: 2+ BLE edema, warm ?Skin: Normal turgor and texture; no rash ?Psych: Appropriate affect ?Neuro: Alert and oriented to person and place, no focal deficit  ? ? ? ?Vitals:  ? 12/02/21 1048  ?BP: 116/72  ?Pulse: 94  ?Temp: 98 ?F (36.7 ?C)  ?TempSrc: Oral  ?SpO2: 97%  ? ? ? ?97% on RA ?BMI Readings from Last 3 Encounters:  ?11/27/21 29.52 kg/m?  ?11/05/21 29.52 kg/m?  ?10/20/21 29.08 kg/m?  ? ?Wt Readings from Last  3 Encounters:  ?11/05/21 172 lb (78 kg)  ?10/20/21 169 lb 6.4 oz (76.8 kg)  ?10/14/21 166 lb (75.3 kg)  ? ? ? ?CBC ?   ?Component Value Date/Time  ? WBC 5.4 11/10/2021 1512  ? RBC 4.02 11/10/2021 1512  ? HGB 12.6 11/10/2021 1512  ? HGB 10.8 (L) 09/25/2016 1518  ? HCT 39.5 11/10/2021 1512  ? HCT 32.6 (L) 09/25/2016 1518  ? PLT 227 11/10/2021 1512  ? PLT 264 09/25/2016 1518  ? MCV 98.3 11/10/2021 1512  ? MCV 96 09/25/2016 1518  ? MCH 31.3 11/10/2021 1512  ? MCHC 31.9 11/10/2021 1512  ? RDW 16.0 (H) 11/10/2021 1512  ? RDW 15.5 (H) 09/25/2016 1518  ? LYMPHSABS 1.0 11/10/2021 1512  ? LYMPHSABS 1.2 09/25/2016 1518  ? MONOABS 0.5 11/10/2021 1512  ? EOSABS 0.0 11/10/2021 1512  ? EOSABS 0.1 09/25/2016 1518  ? BASOSABS 0.0 11/10/2021 1512  ? BASOSABS 0.0 09/25/2016 1518  ? ?Rheum serologies negative ? ?Chest Imaging: ? ?CXR today slight increase in R effusion, slight decrease in L effusion relative to last week's ? ?CXR 10/20/21 reviewed by me with slight increase in volume of right effusion ? ?CT A/P lung bases 10/17/21 reviewed by me remarkable for  and L effusion, anterior RLL atelectasis more likely than consolidation/pneumonia. Diffuse small bowel wall thickening and mucosal hyperenhancement. Anasarca. Severe hepatic steatosis. ? ?Bedside US of right and left pleural space today performed and interpreted by me with simple appearing effusions - moderate on right, very  small on left ? ?Left effusion: ? ? ?Right effusion: ? ? ?  ? ?Pulmonary Functions Testing Results: ?No flowsheet data found. ? ? ? ?Echocardiogram:  ? ?TTE 07/25/22: ?  1. Technically difficult study.

## 2021-12-02 ENCOUNTER — Encounter (HOSPITAL_COMMUNITY): Payer: Medicare Other

## 2021-12-02 ENCOUNTER — Ambulatory Visit (INDEPENDENT_AMBULATORY_CARE_PROVIDER_SITE_OTHER): Payer: Medicare Other | Admitting: Student

## 2021-12-02 ENCOUNTER — Other Ambulatory Visit: Payer: Self-pay

## 2021-12-02 ENCOUNTER — Encounter: Payer: Self-pay | Admitting: Student

## 2021-12-02 ENCOUNTER — Ambulatory Visit (INDEPENDENT_AMBULATORY_CARE_PROVIDER_SITE_OTHER): Payer: Medicare Other

## 2021-12-02 VITALS — BP 116/72 | HR 94 | Temp 98.0°F

## 2021-12-02 DIAGNOSIS — J90 Pleural effusion, not elsewhere classified: Secondary | ICD-10-CM

## 2021-12-02 DIAGNOSIS — R0600 Dyspnea, unspecified: Secondary | ICD-10-CM

## 2021-12-02 NOTE — Patient Instructions (Addendum)
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

## 2021-12-03 ENCOUNTER — Other Ambulatory Visit (HOSPITAL_COMMUNITY): Payer: Medicare Other

## 2021-12-04 ENCOUNTER — Encounter: Payer: Self-pay | Admitting: Cardiology

## 2021-12-04 ENCOUNTER — Encounter (HOSPITAL_COMMUNITY): Payer: Medicare Other | Admitting: Physical Therapy

## 2021-12-04 NOTE — Telephone Encounter (Signed)
PA Titus Mould returned call. Transferred to LPN ?

## 2021-12-04 NOTE — Telephone Encounter (Signed)
Call received from Ms [name] - could not hear each other due to poor reception - call dropped.   ?Attempted to return call but had to leave message.  ?

## 2021-12-04 NOTE — Telephone Encounter (Signed)
error 

## 2021-12-05 ENCOUNTER — Encounter (HOSPITAL_COMMUNITY): Payer: Medicare Other

## 2021-12-07 ENCOUNTER — Encounter: Payer: Self-pay | Admitting: Student

## 2021-12-08 ENCOUNTER — Telehealth: Payer: Self-pay | Admitting: Cardiology

## 2021-12-08 NOTE — Telephone Encounter (Signed)
Attempted to reach Titus Mould, PA - no answer.  ? ?Seth Bake PA ?[facility] ?  ?[phone] ?

## 2021-12-08 NOTE — Telephone Encounter (Signed)
"  I am having more and more problems with breathing and shortness of breath.  As discussed, I want to come in on Monday, April 3rd or Tuesday April 4th to have the fluid drawn off so I can breathe better.  I will be out of town later that week and do not want to wind up in an emergency situation while I am gone.  Please let me know what time on either of these dates will work for you.  Thank you!!" ? ? ?Dr. Verlee Monte please advise  ?

## 2021-12-08 NOTE — Telephone Encounter (Signed)
See 2/27 encounter ? ?Seth Bake with [facility] called in requesting to speak with Edd Fabian, LPN.  ?She did not go into detail, stating she would prefer to speak directly with Edd Fabian because they kept missing each other. ?Call disconnected before I was able to trasnfer. ?

## 2021-12-09 ENCOUNTER — Encounter (HOSPITAL_COMMUNITY): Payer: Medicare Other | Admitting: Physical Therapy

## 2021-12-09 ENCOUNTER — Other Ambulatory Visit: Payer: Self-pay

## 2021-12-09 NOTE — Telephone Encounter (Signed)
Called and discussed pleural effusion which per providers at [facility] was larger. Has some orthopnea now. Has upcoming enteroscopy this Friday per pt. Can we schedule her for appointment for thoracentesis with me at 8:45am tomorrow? ?

## 2021-12-10 ENCOUNTER — Encounter (HOSPITAL_COMMUNITY): Payer: Medicare Other

## 2021-12-10 ENCOUNTER — Other Ambulatory Visit: Payer: Self-pay

## 2021-12-10 ENCOUNTER — Ambulatory Visit (INDEPENDENT_AMBULATORY_CARE_PROVIDER_SITE_OTHER): Payer: Medicare Other | Admitting: Student

## 2021-12-10 ENCOUNTER — Ambulatory Visit (HOSPITAL_COMMUNITY): Payer: Medicare Other | Admitting: Physician Assistant

## 2021-12-10 ENCOUNTER — Encounter: Payer: Self-pay | Admitting: Student

## 2021-12-10 VITALS — BP 108/74 | HR 90 | Temp 97.7°F

## 2021-12-10 DIAGNOSIS — R0600 Dyspnea, unspecified: Secondary | ICD-10-CM | POA: Diagnosis not present

## 2021-12-10 DIAGNOSIS — J90 Pleural effusion, not elsewhere classified: Secondary | ICD-10-CM

## 2021-12-10 NOTE — Patient Instructions (Signed)
-   Look up PleurX catheter on the internet. [employer] is [employer] and their website has good info on it: ?LACodes.co[employer]za ?- There is also a PleurX catheter app ?- I think it would be reasonable to try this and if you decide you'd like me to schedule you to place this catheter for you, let me know with either phone call or my chart message ?- see you at the end of May or sooner if need be! ?

## 2021-12-10 NOTE — Progress Notes (Signed)
Thoracentesis  Procedure Note ? ?[name]  ?[id number]  ?12-Jan-1951 ? ?Date:12/10/21  ?Time:6:38 PM  ? ?Provider Performing:Ardella Chhim Merlene Pulling  ? ?Procedure: Thoracentesis with imaging guidance (44967) ? ?Indication(s) ?Pleural Effusion ? ?Consent ?Risks of the procedure as well as the alternatives and risks of each were explained to the patient and/or caregiver.  Consent for the procedure was obtained and is signed in the bedside chart ? ?Anesthesia ?Topical only with 1% lidocaine  ? ? ?Time Out ?Verified patient identification, verified procedure, site/side was marked, verified correct patient position, special equipment/implants available, medications/allergies/relevant history reviewed, required imaging and test results available. ? ? ?Sterile Technique ?Maximal sterile technique including full sterile barrier drape, hand hygiene, sterile gown, sterile gloves, mask, hair covering, sterile ultrasound probe cover (if used). ? ?Procedure Description ?Ultrasound was used to identify appropriate pleural anatomy for placement and overlying skin marked.  Area of drainage cleaned and draped in sterile fashion. Lidocaine was used to anesthetize the skin and subcutaneous tissue.  1200 cc's of serous appearing fluid was drained from the right pleural space. Catheter then removed and bandaid applied to site. ? ? ? ? ? ?Complications/Tolerance ?None; patient tolerated the procedure well. ?Chest X-ray is ordered to confirm no post-procedural complication. ? ? ?EBL ?Minimal ? ? ?Specimen(s) ?Pleural fluid not sent for studies ? ? ? ? ? ? ? ? ?  ?

## 2021-12-10 NOTE — Progress Notes (Signed)
? ?Synopsis: Referred for dyspnea by [facility] ? ?Subjective:  ? ?PATIENT ID: [name] GENDER: female DOB: Jan 21, 1951, MRN: [id number] ? ?Chief Complaint  ?Patient presents with  ? Follow-up  ?  Thoracentesis today.   ? ?71yF with history of lymphedema, diastolic dysfunction and mildly reduced EF, gastric bypass 2003, secondary hyperparathyroidism, GERD, SVT, IDA, covid-19 infection 12/2019 and 02/2021 ? ?Underwent right thoracentesis 10/15/21 1L drained. Cyto/cx negative. Transudate. She says she did feel better after thoracentesis.  ? ?She says she feels bad. Very short of breath and weak, orthopneic. Some cough with chest congestion but not coughing anything up. No fever. She says she has normal diarrhea - sort of gold looking/yellow.  ? ?No family history of lung cancer ? ?Interval HPI: ?Since last visit she has had greater orthopnea. She wonders if she also has a component of anxiety where she becomes aware of her tachypnea then worried about it and more tachypneic. After seeing lymphatic specialist they thought size of pleural effusion was probably greater than at time of our last visit although I'm unable to see their imaging or report. 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 ? ?Otherwise pertinent review of systems is negative. ? ?Past Medical History:  ?Diagnosis Date  ? Asthma   ? CHF (congestive heart failure) ([place])   ? GERD (gastroesophageal reflux disease)   ? Internal hemorrhoids   ? Iron deficiency anemia   ? Osteoporosis   ? Peptic ulcer   ? Tubular adenoma of colon   ? Vertigo   ?  ? ?Family History  ?Problem Relation Age of Onset  ? Hypertension Other   ? Stroke Other   ? Diabetes Other   ? Heart attack Other   ? Obesity Other   ? Diabetes Father   ? Heart disease Father   ? Colon cancer Father   ? Breast cancer Paternal Grandmother   ? Healthy Daughter   ? Stomach cancer Neg Hx   ? Pancreatic  cancer Neg Hx   ?  ? ?Past Surgical History:  ?Procedure Laterality Date  ? BACK SURGERY    ? BREAST SURGERY    ? CHOLECYSTECTOMY    ? COLONOSCOPY WITH PROPOFOL N/A 06/02/2017  ? Procedure: COLONOSCOPY WITH PROPOFOL;  Surgeon: Jonathon Bellows, MD;  Location[facility] ENDOSCOPY;  Service: Gastroenterology;  Laterality: N/A;  ? COLONOSCOPY WITH PROPOFOL N/A 01/28/2021  ? Procedure: COLONOSCOPY WITH PROPOFOL;  Surgeon: Jonathon Bellows, MD;  Location: [facility] ENDOSCOPY;  Service: Gastroenterology;  Laterality: N/A;  ? ESOPHAGOGASTRODUODENOSCOPY (EGD) WITH PROPOFOL N/A 07/01/2017  ? Procedure: ESOPHAGOGASTRODUODENOSCOPY (EGD) WITH PROPOFOL;  Surgeon: Jonathon Bellows, MD;  Location: [facility] ENDOSCOPY;  Service: Gastroenterology;  Laterality: N/A;  ? ESOPHAGOGASTRODUODENOSCOPY (EGD) WITH PROPOFOL N/A 01/28/2021  ? Procedure: ESOPHAGOGASTRODUODENOSCOPY (EGD) WITH PROPOFOL;  Surgeon: Jonathon Bellows, MD;  Location: [facility] ENDOSCOPY;  Service: Gastroenterology;  Laterality: N/A;  ? FLEXIBLE SIGMOIDOSCOPY N/A 07/01/2017  ? Procedure: FLEXIBLE SIGMOIDOSCOPY;  Surgeon: Jonathon Bellows, MD;  Location: [facility] ENDOSCOPY;  Service: Gastroenterology;  Laterality: N/A;  ? KNEE SURGERY    ? mini gastric bypass    ? SHOULDER SURGERY    ? TONSILLECTOMY    ? ? ?Social History  ? ?Socioeconomic History  ? Marital status: Married  ?  Spouse name: Not on file  ? Number of children: Not on file  ? Years of education: Not on file  ? Highest education level: Not on file  ?Occupational  History  ? Not on file  ?Tobacco Use  ? Smoking status: Never  ? Smokeless tobacco: Never  ?Vaping Use  ? Vaping Use: Never used  ?Substance and Sexual Activity  ? Alcohol use: No  ?  Alcohol/week: 0.0 standard drinks  ? Drug use: Never  ? Sexual activity: Never  ?Other Topics Concern  ? Not on file  ?Social History Narrative  ? Not on file  ? ?Social Determinants of Health  ? ?Financial Resource Strain: Not on file  ?Food Insecurity: Not on file  ?Transportation Needs: Not on file  ?Physical  Activity: Not on file  ?Stress: Not on file  ?Social Connections: Not on file  ?Intimate Partner Violence: Not on file  ?  ? ?Allergies  ?Allergen Reactions  ? Zoledronic Acid Other (See Comments)  ?  Other reaction(s): fever and flu-like symptoms  ? Azithromycin Diarrhea  ?  Other reaction(s): diarrhea  ? Lac Bovis Diarrhea  ? Prednisone Diarrhea  ? Sulfa Antibiotics Itching  ? Tape Dermatitis  ?  ? ?Outpatient Medications Prior to Visit  ?Medication Sig Dispense Refill  ? albuterol (VENTOLIN HFA) 108 (90 Base) MCG/ACT inhaler Inhale 2 puffs into the lungs in the morning, at noon, in the evening, and at bedtime.    ? calcitRIOL (ROCALTROL) 0.5 MCG capsule Take 0.5 mcg by mouth daily.  5  ? Cholecalciferol (CVS D3) 125 MCG (5000 UT) capsule Take 1 tablet by mouth daily.    ? denosumab (PROLIA) 60 MG/ML SOSY injection Inject 60 mg into the skin every 6 (six) months.    ? diltiazem (CARDIZEM) 30 MG tablet Take 1 tablet (30 mg total) by mouth 2 (two) times daily. 60 tablet 2  ? furosemide (LASIX) 20 MG tablet Take 1 tablet (20 mg total) by mouth daily. 30 tablet 0  ? levothyroxine (SYNTHROID) 25 MCG tablet Take 25 mcg by mouth every morning.    ? Melatonin 10 MG TABS Take 1 tablet by mouth as needed.    ? Multiple Vitamin (M.V.I. ADULT IV) Take 1 tablet by mouth daily.    ? pantoprazole (PROTONIX) 20 MG tablet Take 1 tablet (20 mg total) by mouth daily. 90 tablet 3  ? Vitamin A 2400 MCG (8000 UT) CAPS Take 1 capsule by mouth daily.    ? Vitamin E 180 MG (400 UNIT) CAPS Take 1 capsule by mouth daily.    ? ?Facility-Administered Medications Prior to Visit  ?Medication Dose Route Frequency Provider Last Rate Last Admin  ? albumin human 25 % solution 25 g  25 g Intravenous Once Jonathon Bellows, MD      ? ? ? ? ? ?Objective:  ? ?Physical Exam: ? ?General appearance: 71 y.o.,, female, chronically ill appearing ?Eyes: PERRL, tracking appropriately ?HENT: NCAT; MMM ?Neck: Trachea midline; no lymphadenopathy, no JVD ?Lungs:  diminished on right, no crackles, no wheeze, with normal respiratory effort ?CV: tachy, RR, no murmur  ?Abdomen: Soft, non-tender; non-distended, BS present  ?Extremities: 2+ BLE edema, warm ?Skin: Normal turgor and texture; no rash ?Psych: Appropriate affect ?Neuro: Alert and oriented to person and place, no focal deficit  ? ? ? ?Vitals:  ? 12/10/21 0844  ?BP: 108/74  ?Pulse: 90  ?Temp: 97.7 ?F (36.5 ?C)  ?TempSrc: Oral  ?SpO2: 91%  ? ? ? ?91% on RA ?BMI Readings from Last 3 Encounters:  ?11/27/21 29.52 kg/m?  ?11/05/21 29.52 kg/m?  ?10/20/21 29.08 kg/m?  ? ?Wt Readings from Last 3 Encounters:  ?11/05/21 172 lb (  78 kg)  ?10/20/21 169 lb 6.4 oz (76.8 kg)  ?10/14/21 166 lb (75.3 kg)  ? ? ? ?CBC ?   ?Component Value Date/Time  ? WBC 5.4 11/10/2021 1512  ? RBC 4.02 11/10/2021 1512  ? HGB 12.6 11/10/2021 1512  ? HGB 10.8 (L) 09/25/2016 1518  ? HCT 39.5 11/10/2021 1512  ? HCT 32.6 (L) 09/25/2016 1518  ? PLT 227 11/10/2021 1512  ? PLT 264 09/25/2016 1518  ? MCV 98.3 11/10/2021 1512  ? MCV 96 09/25/2016 1518  ? MCH 31.3 11/10/2021 1512  ? MCHC 31.9 11/10/2021 1512  ? RDW 16.0 (H) 11/10/2021 1512  ? RDW 15.5 (H) 09/25/2016 1518  ? LYMPHSABS 1.0 11/10/2021 1512  ? LYMPHSABS 1.2 09/25/2016 1518  ? MONOABS 0.5 11/10/2021 1512  ? EOSABS 0.0 11/10/2021 1512  ? EOSABS 0.1 09/25/2016 1518  ? BASOSABS 0.0 11/10/2021 1512  ? BASOSABS 0.0 09/25/2016 1518  ? ?Rheum serologies negative ? ?Chest Imaging: ? ?CXR today slight increase in R effusion, slight decrease in L effusion relative to last week's ? ?CXR 10/20/21 reviewed by me with slight increase in volume of right effusion ? ?CT A/P lung bases 10/17/21 reviewed by me remarkable for  and L effusion, anterior RLL atelectasis more likely than consolidation/pneumonia. Diffuse small bowel wall thickening and mucosal hyperenhancement. Anasarca. Severe hepatic steatosis. ? ? ? ? ?  ? ?Pulmonary Functions Testing Results: ?   ? View : No data to display.  ?  ?  ?  ? ? ? ? ?Echocardiogram:   ? ?TTE 07/25/22: ?  1. Technically difficult study.  ?  2. The left ventricle is upper normal in size with normal wall thickness.  ?  3. The left ventricular systolic function is mildly decreased, LVEF is  ?visually est

## 2021-12-10 NOTE — Telephone Encounter (Signed)
Pt was reached and was scheduled for an appt with Dr. Verlee Monte today 3/29. Nothing further needed. ?

## 2021-12-11 ENCOUNTER — Encounter (HOSPITAL_COMMUNITY): Payer: Medicare Other | Admitting: Physical Therapy

## 2021-12-11 NOTE — Telephone Encounter (Signed)
See recent my chart messages

## 2021-12-11 NOTE — Telephone Encounter (Signed)
Detailed voice message received - all that Seth Bake needs on her end is clearance - that the patient is not in any stages of acute heart failure since her BNP was elevated in the 1000's.  Making sure that was clear & that she is fine for the management of her leg swelling.  Patient should be directed back to her in regards to any vascular questions she may have.  Seth Bake will be responsible for ordering any further testing once she gets clearance from [place].   ? ?

## 2021-12-11 NOTE — Telephone Encounter (Signed)
Attempted to reach [name] again - left detailed message.   ?

## 2021-12-12 ENCOUNTER — Ambulatory Visit: Payer: Medicare Other | Admitting: Anesthesiology

## 2021-12-12 ENCOUNTER — Encounter
Admission: RE | Disposition: A | Discharge status: Home / Self Care | Payer: Self-pay | Source: Ambulatory Visit | Attending: Gastroenterology

## 2021-12-12 ENCOUNTER — Encounter (HOSPITAL_COMMUNITY): Payer: Medicare Other

## 2021-12-12 ENCOUNTER — Encounter: Payer: Self-pay | Admitting: Gastroenterology

## 2021-12-12 ENCOUNTER — Ambulatory Visit
Admission: RE | Admit: 2021-12-12 | Discharge: 2021-12-12 | Disposition: A | Discharge status: Home / Self Care | Payer: Medicare Other | Source: Ambulatory Visit | Attending: Gastroenterology | Admitting: Gastroenterology

## 2021-12-12 DIAGNOSIS — M810 Age-related osteoporosis without current pathological fracture: Secondary | ICD-10-CM | POA: Diagnosis not present

## 2021-12-12 DIAGNOSIS — I110 Hypertensive heart disease with heart failure: Secondary | ICD-10-CM | POA: Insufficient documentation

## 2021-12-12 DIAGNOSIS — I509 Heart failure, unspecified: Secondary | ICD-10-CM | POA: Diagnosis not present

## 2021-12-12 DIAGNOSIS — R634 Abnormal weight loss: Secondary | ICD-10-CM | POA: Diagnosis not present

## 2021-12-12 DIAGNOSIS — Z86010 Personal history of colonic polyps: Secondary | ICD-10-CM | POA: Diagnosis not present

## 2021-12-12 DIAGNOSIS — K2950 Unspecified chronic gastritis without bleeding: Secondary | ICD-10-CM | POA: Diagnosis not present

## 2021-12-12 DIAGNOSIS — R627 Adult failure to thrive: Secondary | ICD-10-CM | POA: Insufficient documentation

## 2021-12-12 DIAGNOSIS — J449 Chronic obstructive pulmonary disease, unspecified: Secondary | ICD-10-CM | POA: Diagnosis not present

## 2021-12-12 DIAGNOSIS — K219 Gastro-esophageal reflux disease without esophagitis: Secondary | ICD-10-CM | POA: Diagnosis not present

## 2021-12-12 DIAGNOSIS — Z980 Intestinal bypass and anastomosis status: Secondary | ICD-10-CM | POA: Diagnosis not present

## 2021-12-12 DIAGNOSIS — E039 Hypothyroidism, unspecified: Secondary | ICD-10-CM | POA: Diagnosis not present

## 2021-12-12 DIAGNOSIS — E46 Unspecified protein-calorie malnutrition: Secondary | ICD-10-CM | POA: Insufficient documentation

## 2021-12-12 HISTORY — PX: COLONOSCOPY WITH PROPOFOL: SHX5780

## 2021-12-12 HISTORY — PX: ESOPHAGOGASTRODUODENOSCOPY (EGD) WITH PROPOFOL: SHX5813

## 2021-12-12 SURGERY — COLONOSCOPY WITH PROPOFOL
Anesthesia: General

## 2021-12-12 MED ORDER — SODIUM CHLORIDE 0.9 % IV SOLN: INTRAVENOUS | Status: DC

## 2021-12-12 MED ORDER — SODIUM CHLORIDE 0.9 % IV SOLN: INTRAVENOUS | Status: DC | PRN

## 2021-12-12 MED ORDER — PROPOFOL 10 MG/ML IV BOLUS
INTRAVENOUS | Status: DC | PRN
  Administered 2021-12-12: 40 mg via INTRAVENOUS
  Administered 2021-12-12: 10 mg via INTRAVENOUS

## 2021-12-12 MED ORDER — PROPOFOL 500 MG/50ML IV EMUL
INTRAVENOUS | Status: DC | PRN
  Administered 2021-12-12: 150 ug/kg/min via INTRAVENOUS

## 2021-12-12 MED ORDER — PHENYLEPHRINE 40 MCG/ML (10ML) SYRINGE FOR IV PUSH (FOR BLOOD PRESSURE SUPPORT)
PREFILLED_SYRINGE | INTRAVENOUS | Status: DC | PRN
  Administered 2021-12-12: 40 ug via INTRAVENOUS

## 2021-12-12 NOTE — Anesthesia Procedure Notes (Signed)
Procedure Name: [place] ?Date/Time: 12/12/2021 4:20 PM ?Performed by: Jerrye Noble, CRNA ?Pre-anesthesia Checklist: Patient identified, Emergency Drugs available, Suction available and Patient being monitored ?Patient Re-evaluated:Patient Re-evaluated prior to induction ?Oxygen Delivery Method: Nasal cannula ? ? ? ? ?

## 2021-12-12 NOTE — H&P (Signed)
? ? ? ?Jonathon Bellows, MD ?[address] [place], [place], [place], [address] ?[address], [place], [place], [place], [address] ?Phone: [phone]  ?Fax: [phone] ? ?Primary Care Physician:  [facility] ? ? ?Pre-Procedure History & Physical: ?HPI:  [name] is a 71 y.o. female is here for an endoscopy and colonoscopy  ?  ?Past Medical History:  ?Diagnosis Date  ? Asthma   ? CHF (congestive heart failure) ([place])   ? GERD (gastroesophageal reflux disease)   ? Internal hemorrhoids   ? Iron deficiency anemia   ? Osteoporosis   ? Peptic ulcer   ? Tubular adenoma of colon   ? Vertigo   ? ? ?Past Surgical History:  ?Procedure Laterality Date  ? BACK SURGERY    ? BREAST SURGERY    ? CHOLECYSTECTOMY    ? COLONOSCOPY WITH PROPOFOL N/A 06/02/2017  ? Procedure: COLONOSCOPY WITH PROPOFOL;  Surgeon: Jonathon Bellows, MD;  Location: [facility] ENDOSCOPY;  Service: Gastroenterology;  Laterality: N/A;  ? COLONOSCOPY WITH PROPOFOL N/A 01/28/2021  ? Procedure: COLONOSCOPY WITH PROPOFOL;  Surgeon: Jonathon Bellows, MD;  Location: [facility] ENDOSCOPY;  Service: Gastroenterology;  Laterality: N/A;  ? ESOPHAGOGASTRODUODENOSCOPY (EGD) WITH PROPOFOL N/A 07/01/2017  ? Procedure: ESOPHAGOGASTRODUODENOSCOPY (EGD) WITH PROPOFOL;  Surgeon: Jonathon Bellows, MD;  Location: [facility] ENDOSCOPY;  Service: Gastroenterology;  Laterality: N/A;  ? ESOPHAGOGASTRODUODENOSCOPY (EGD) WITH PROPOFOL N/A 01/28/2021  ? Procedure: ESOPHAGOGASTRODUODENOSCOPY (EGD) WITH PROPOFOL;  Surgeon: Jonathon Bellows, MD;  Location: [facility] ENDOSCOPY;  Service: Gastroenterology;  Laterality: N/A;  ? FLEXIBLE SIGMOIDOSCOPY N/A 07/01/2017  ? Procedure: FLEXIBLE SIGMOIDOSCOPY;  Surgeon: Jonathon Bellows, MD;  Location: [facility] ENDOSCOPY;  Service: Gastroenterology;  Laterality: N/A;  ? KNEE SURGERY    ? mini gastric bypass    ? SHOULDER SURGERY    ? TONSILLECTOMY    ? ? ?Prior to Admission medications   ?Medication Sig Start Date End Date Taking? Authorizing Provider  ?albuterol (VENTOLIN HFA)  108 (90 Base) MCG/ACT inhaler Inhale 2 puffs into the lungs in the morning, at noon, in the evening, and at bedtime. 09/22/21   [provider]  ?calcitRIOL (ROCALTROL) 0.5 MCG capsule Take 0.5 mcg by mouth daily. 08/30/16   [provider]  ?Cholecalciferol (CVS D3) 125 MCG (5000 UT) capsule Take 1 tablet by mouth daily.    [provider]  ?denosumab (PROLIA) 60 MG/ML SOSY injection Inject 60 mg into the skin every 6 (six) months.    [provider]  ?diltiazem (CARDIZEM) 30 MG tablet Take 1 tablet (30 mg total) by mouth 2 (two) times daily. 10/13/21   Arnoldo Lenis, MD  ?furosemide (LASIX) 20 MG tablet Take 1 tablet (20 mg total) by mouth daily. 11/27/21   Freddi Starr, MD  ?levothyroxine (SYNTHROID) 25 MCG tablet Take 25 mcg by mouth every morning. 10/21/21   [provider]  ?Melatonin 10 MG TABS Take 1 tablet by mouth as needed.    [provider]  ?Multiple Vitamin (M.V.I. ADULT IV) Take 1 tablet by mouth daily.    [provider]  ?pantoprazole (PROTONIX) 20 MG tablet Take 1 tablet (20 mg total) by mouth daily. 04/03/21   Jonathon Bellows, MD  ?Vitamin A 2400 MCG (8000 UT) CAPS Take 1 capsule by mouth daily.    [provider]  ?Vitamin E 180 MG (400 UNIT) CAPS Take 1 capsule by mouth daily.    [provider]  ? ? ?Allergies as of 11/13/2021 - Review Complete 11/12/2021  ?Allergen  Reaction Noted  ? Zoledronic acid Other (See Comments) 09/02/2021  ? Azithromycin Diarrhea 08/15/2014  ? Lac bovis Diarrhea 08/15/2014  ? Prednisone Diarrhea 08/15/2014  ? Sulfa antibiotics Itching 08/15/2014  ? Tape Dermatitis 02/24/2018  ? ? ?Family History  ?Problem Relation Age of Onset  ? Hypertension Other   ? Stroke Other   ? Diabetes Other   ? Heart attack Other   ? Obesity Other   ? Diabetes Father   ? Heart disease Father   ? Colon cancer Father   ? Breast cancer Paternal Grandmother   ? Healthy Daughter   ? Stomach cancer Neg Hx   ?  Pancreatic cancer Neg Hx   ? ? ?Social History  ? ?Socioeconomic History  ? Marital status: Married  ?  Spouse name: Not on file  ? Number of children: Not on file  ? Years of education: Not on file  ? Highest education level: Not on file  ?Occupational History  ? Not on file  ?Tobacco Use  ? Smoking status: Never  ? Smokeless tobacco: Never  ?Vaping Use  ? Vaping Use: Never used  ?Substance and Sexual Activity  ? Alcohol use: No  ?  Alcohol/week: 0.0 standard drinks  ? Drug use: Never  ? Sexual activity: Never  ?Other Topics Concern  ? Not on file  ?Social History Narrative  ? Not on file  ? ?Social Determinants of Health  ? ?Financial Resource Strain: Not on file  ?Food Insecurity: Not on file  ?Transportation Needs: Not on file  ?Physical Activity: Not on file  ?Stress: Not on file  ?Social Connections: Not on file  ?Intimate Partner Violence: Not on file  ? ? ?Review of Systems: ?See HPI, otherwise negative ROS ? ?Physical Exam: ?There were no vitals taken for this visit. ?General:   Alert,  pleasant and cooperative in NAD ?Head:  Normocephalic and atraumatic. ?Neck:  Supple; no masses or thyromegaly. ?Lungs:  Clear throughout to auscultation, normal respiratory effort.    ?Heart:  +S1, +S2, Regular rate and rhythm, No edema. ?Abdomen:  Soft, nontender and nondistended. Normal bowel sounds, without guarding, and without rebound.   ?Neurologic:  Alert and  oriented x4;  grossly normal neurologically. ? ?Impression/Plan: ?[name] is here for an endoscopy and colonoscopy  to be performed for  evaluation of malnutrition and weight loss.  ?   ?Risks, benefits, limitations, and alternatives regarding endoscopy have been reviewed with the patient.  Questions have been answered.  All parties agreeable. ? ? ?Jonathon Bellows, MD  12/12/2021, 3:59 PM ? ?

## 2021-12-12 NOTE — Op Note (Signed)
[facility] ?Gastroenterology ?Patient Name: [name] ?Procedure Date: 12/12/2021 3:28 PM ?MRN: [id number] ?Account #: [id number] ?Date of Birth: August 03, 1951 ?Admit Type: Outpatient ?Age: 71 ?Room: [facility] ENDO ROOM 3 ?Gender: Female ?Note Status: Finalized ?Instrument Name: Peds Colonoscope [id number] ?Procedure:             Colonoscopy ?Indications:           Failure to thrive, Weight loss ?Providers:             Jonathon Bellows MD, MD ?Medicines:             Monitored Anesthesia Care ?Complications:         No immediate complications. ?Procedure:             Pre-Anesthesia Assessment: ?                       - Prior to the procedure, a History and Physical was  ?                       performed, and patient medications, allergies and  ?                       sensitivities were reviewed. The patient's tolerance  ?                       of previous anesthesia was reviewed. ?                       - The risks and benefits of the procedure and the  ?                       sedation options and risks were discussed with the  ?                       patient. All questions were answered and informed  ?                       consent was obtained. ?                       - ASA Grade Assessment: III - A patient with severe  ?                       systemic disease. ?                       After obtaining informed consent, the colonoscope was  ?                       passed under direct vision. Throughout the procedure,  ?                       the patient's blood pressure, pulse, and oxygen  ?                       saturations were monitored continuously. The  ?                       Colonoscope was introduced through the anus and  ?  advanced to the the terminal ileum. The colonoscopy  ?                       was performed with ease. The patient tolerated the  ?                       procedure well. The quality of the bowel preparation  ?                       was good. ?Findings: ?     The  perianal and digital rectal examinations were normal. ?     Normal mucosa was found in the entire colon. Biopsies were taken with a  ?     cold forceps for histology. ?     The exam was otherwise without abnormality. ?Impression:            - Normal mucosa in the entire examined colon. Biopsied. ?                       - The examination was otherwise normal. ?Recommendation:        - Discharge patient to home (with escort). ?                       - Resume previous diet. ?                       - Continue present medications. ?                       - Await pathology results. ?                       - Return to my office in 1 week. ?Procedure Code(s):     --- Professional --- ?                       [phone], Colonoscopy, flexible; with biopsy, single or  ?                       multiple ?Diagnosis Code(s):     --- Professional --- ?                       R62.7, Adult failure to thrive ?                       R63.4, Abnormal weight loss ?CPT copyright 2019 [employer]. All rights reserved. ?The codes documented in this report are preliminary and upon coder review may  ?be revised to meet current compliance requirements. ?Jonathon Bellows, MD ?Jonathon Bellows MD, MD ?12/12/2021 4:49:56 PM ?This report has been signed electronically. ?Number of Addenda: 0 ?Note Initiated On: 12/12/2021 3:28 PM ?Scope Withdrawal Time: 0 hours 7 minutes 49 seconds  ?Total Procedure Duration: 0 hours 10 minutes 53 seconds  ?Estimated Blood Loss:  Estimated blood loss: none. ?     [facility] ?

## 2021-12-12 NOTE — Anesthesia Preprocedure Evaluation (Addendum)
Anesthesia Evaluation  ?Patient identified by MRN, date of birth, ID band ?Patient awake ? ? ? ?Reviewed: ?Allergy & Precautions, NPO status , Patient's Chart, lab work & pertinent test results ? ?Airway ?Mallampati: III ? ?TM Distance: >3 FB ?Neck ROM: full ? ? ? Dental ?no notable dental hx. ? ?  ?Pulmonary ?shortness of breath and with exertion, asthma , COPD,  ?Underwent right thoracentesis 10/15/21 1L drained ?-Recurrent right pleural effusion ?  ? ?+ wheezing ? ? ? ? ? Cardiovascular ?Exercise Tolerance: Poor ?hypertension, Pt. on medications ?+CHF  ?Normal cardiovascular exam+ dysrhythmias (nonsustained ventricular tachycardia) + Valvular Problems/Murmurs  ? ?TTE 07/25/22: ???1. Technically difficult study.  ???2. The left ventricle is upper normal in size with normal wall thickness.  ???3. The left ventricular systolic function is mildly decreased, LVEF is  ?visually estimated at 45-50%.  ???4. There is grade I diastolic dysfunction (impaired relaxation).  ???5. There is mild mitral valve regurgitation.  ???6. There is mild aortic regurgitation.  ???7. The left atrium is moderately dilated in size.  ???8. The right ventricle is normal in size, with normal systolic function.  ?  ?Neuro/Psych ?negative psych ROS  ? GI/Hepatic ?Neg liver ROS, PUD, GERD  ,Lower GI bleed ?gastric bypass 2003 ?  ?Endo/Other  ?Hypothyroidism  ? Renal/GU ?negative Renal ROS  ?negative genitourinary ?  ?Musculoskeletal ? ? Abdominal ?(+) + obese,   ?Peds ? Hematology ? ?(+) Blood dyscrasia, anemia ,   ?Anesthesia Other Findings ?Past Medical History: ?No date: Asthma ?No date: CHF (congestive heart failure) ([place]) ?No date: GERD (gastroesophageal reflux disease) ?No date: Internal hemorrhoids ?No date: Iron deficiency anemia ?No date: Osteoporosis ?No date: Peptic ulcer ?No date: Tubular adenoma of colon ?No date: Vertigo ? ?Past Surgical History: ?No date: BACK SURGERY ?No date: BREAST SURGERY ?No  date: CHOLECYSTECTOMY ?06/02/2017: COLONOSCOPY WITH PROPOFOL; N/A ?    Comment:  Procedure: COLONOSCOPY WITH PROPOFOL;  Surgeon: Vicente Males,  ?             Bailey Mech, MD;  Location: [place];  Service:  ?             Gastroenterology;  Laterality: N/A; ?01/28/2021: COLONOSCOPY WITH PROPOFOL; N/A ?    Comment:  Procedure: COLONOSCOPY WITH PROPOFOL;  Surgeon: Vicente Males,  ?             Bailey Mech, MD;  Location: [place];  Service:  ?             Gastroenterology;  Laterality: N/A; ?07/01/2017: ESOPHAGOGASTRODUODENOSCOPY (EGD) WITH PROPOFOL; N/A ?    Comment:  Procedure: ESOPHAGOGASTRODUODENOSCOPY (EGD) WITH  ?             PROPOFOL;  Surgeon: Jonathon Bellows, MD;  Location: [facility]  ?             ENDOSCOPY;  Service: Gastroenterology;  Laterality: N/A; ?01/28/2021: ESOPHAGOGASTRODUODENOSCOPY (EGD) WITH PROPOFOL; N/A ?    Comment:  Procedure: ESOPHAGOGASTRODUODENOSCOPY (EGD) WITH  ?             PROPOFOL;  Surgeon: Jonathon Bellows, MD;  Location: [facility]  ?             ENDOSCOPY;  Service: Gastroenterology;  Laterality: N/A; ?07/01/2017: FLEXIBLE SIGMOIDOSCOPY; N/A ?    Comment:  Procedure: FLEXIBLE SIGMOIDOSCOPY;  Surgeon: Vicente Males,  ?             Bailey Mech, MD;  Location: [place];  Service:  ?  Gastroenterology;  Laterality: N/A; ?No date: KNEE SURGERY ?No date: mini gastric bypass ?No date: SHOULDER SURGERY ?No date: TONSILLECTOMY ? ? ? ? Reproductive/Obstetrics ?negative OB ROS ? ?  ? ? ? ? ? ? ? ? ? ? ? ? ? ?  ?  ? ? ? ? ? ? ?Anesthesia Physical ?Anesthesia Plan ? ?ASA: 3 ? ?Anesthesia Plan: General  ? ?Post-op Pain Management:   ? ?Induction:  ? ?PONV Risk Score and Plan: Propofol infusion and TIVA ? ?Airway Management Planned: Natural Airway and Simple Face Mask ? ?Additional Equipment:  ? ?Intra-op Plan:  ? ?Post-operative Plan:  ? ?Informed Consent: I have reviewed the patients History and Physical, chart, labs and discussed the procedure including the risks, benefits and alternatives for the proposed anesthesia with the  patient or authorized representative who has indicated his/her understanding and acceptance.  ? ? ? ?Dental advisory given ? ?Plan Discussed with: Anesthesiologist, CRNA and Surgeon ? ?Anesthesia Plan Comments:   ? ? ? ? ? ?Anesthesia Quick Evaluation ? ?

## 2021-12-12 NOTE — Anesthesia Postprocedure Evaluation (Signed)
Anesthesia Post Note ? ?Patient: [name] ? ?Procedure(s) Performed: COLONOSCOPY WITH PROPOFOL ?ESOPHAGOGASTRODUODENOSCOPY (EGD) WITH PROPOFOL ? ?Patient location during evaluation: PACU ?Anesthesia Type: General ?Level of consciousness: awake and alert ?Pain management: pain level controlled ?Vital Signs Assessment: post-procedure vital signs reviewed and stable ?Respiratory status: spontaneous breathing, nonlabored ventilation and respiratory function stable ?Cardiovascular status: blood pressure returned to baseline and stable ?Postop Assessment: no apparent nausea or vomiting ?Anesthetic complications: no ? ? ?No notable events documented. ? ? ?Last Vitals:  ?Vitals:  ? 12/12/21 1700 12/12/21 1711  ?BP: (!) 96/49 114/79  ?Pulse: 99 90  ?Resp: 20 19  ?Temp:    ?SpO2: 99% 99%  ?  ?Last Pain:  ?Vitals:  ? 12/12/21 1711  ?TempSrc:   ?PainSc: 0-No pain  ? ? ?  ?  ?  ?  ?  ?  ? ?[place] Ouch ? ? ? ? ?

## 2021-12-12 NOTE — Transfer of Care (Signed)
Immediate Anesthesia Transfer of Care Note ? ?Patient: [name] ? ?Procedure(s) Performed: COLONOSCOPY WITH PROPOFOL ?ESOPHAGOGASTRODUODENOSCOPY (EGD) WITH PROPOFOL ? ?Patient Location: PACU ? ?Anesthesia Type:General ? ?Level of Consciousness: drowsy ? ?Airway & Oxygen Therapy: Patient Spontanous Breathing and Patient connected to face mask oxygen ? ?Post-op Assessment: Report given to RN and Post -op Vital signs reviewed and stable ? ?Post vital signs: Reviewed and stable ? ?Last Vitals:  ?Vitals Value Taken Time  ?BP    ?Temp    ?Pulse    ?Resp    ?SpO2    ? ? ?Last Pain:  ?Vitals:  ? 12/12/21 1606  ?PainSc: 0-No pain  ?   ? ?Patients Stated Pain Goal: 0 (12/12/21 1606) ? ?Complications: No notable events documented. ?

## 2021-12-12 NOTE — Op Note (Signed)
[facility] ?Gastroenterology ?Patient Name: [name] ?Procedure Date: 12/12/2021 3:28 PM ?MRN: [id number] ?Account #: [id number] ?Date of Birth: [birth year] ?Admit Type: Outpatient ?Age: 71 ?Room: [facility] ENDO ROOM 3 ?Gender: Female ?Note Status: Finalized ?Instrument Name: Peds Colonoscope [id number] ?Procedure:             Upper GI endoscopy ?Indications:           Malnutrition, Weight loss ?Providers:             Jonathon Bellows MD, MD ?Medicines:             Monitored Anesthesia Care ?Complications:         No immediate complications. ?Procedure:             Pre-Anesthesia Assessment: ?                       - Prior to the procedure, a History and Physical was  ?                       performed, and patient medications, allergies and  ?                       sensitivities were reviewed. The patient's tolerance  ?                       of previous anesthesia was reviewed. ?                       - The risks and benefits of the procedure and the  ?                       sedation options and risks were discussed with the  ?                       patient. All questions were answered and informed  ?                       consent was obtained. ?                       - ASA Grade Assessment: III - A patient with severe  ?                       systemic disease. ?                       After obtaining informed consent, the endoscope was  ?                       passed under direct vision. Throughout the procedure,  ?                       the patient's blood pressure, pulse, and oxygen  ?                       saturations were monitored continuously. The  ?                       Colonoscope was introduced through the mouth, and  ?  advanced to the jejunum. The upper GI endoscopy was  ?                       accomplished with ease. The patient tolerated the  ?                       procedure well. ?Findings: ?     The esophagus was normal. ?     Evidence of a Roux-en-Y gastrojejunostomy was  found. The gastrojejunal  ?     anastomosis was characterized by congestion, edema and erythema. This  ?     was traversed. The pouch-to-jejunum limb was characterized by healthy  ?     appearing mucosa. The jejunojejunal anastomosis was characterized by  ?     healthy appearing mucosa. Biopsies were taken with a cold forceps for  ?     histology. ?     The cardia and gastric fundus were normal on retroflexion. ?Impression:            - Normal esophagus. ?                       - Roux-en-Y gastrojejunostomy with gastrojejunal  ?                       anastomosis characterized by congestion, edema and  ?                       erythema. Biopsied. ?Recommendation:        - Await pathology results. ?                       - Perform a colonoscopy today. ?Procedure Code(s):     --- Professional --- ?                       [phone], Esophagogastroduodenoscopy, flexible,  ?                       transoral; with biopsy, single or multiple ?Diagnosis Code(s):     --- Professional --- ?                       Z98.0, Intestinal bypass and anastomosis status ?                       E46, Unspecified protein-calorie malnutrition ?                       R63.4, Abnormal weight loss ?CPT copyright 2019 American Medical Association. All rights reserved. ?The codes documented in this report are preliminary and upon coder review may  ?be revised to meet current compliance requirements. ?Jonathon Bellows, MD ?Jonathon Bellows MD, MD ?12/12/2021 4:33:52 PM ?This report has been signed electronically. ?Number of Addenda: 0 ?Note Initiated On: 12/12/2021 3:28 PM ?Estimated Blood Loss:  Estimated blood loss: none. ?     [facility] ?

## 2021-12-13 ENCOUNTER — Encounter: Payer: Self-pay | Admitting: Gastroenterology

## 2021-12-15 ENCOUNTER — Telehealth: Payer: Self-pay | Admitting: Student

## 2021-12-15 ENCOUNTER — Encounter: Payer: Self-pay | Admitting: Gastroenterology

## 2021-12-15 ENCOUNTER — Encounter (HOSPITAL_COMMUNITY): Payer: Medicare Other | Admitting: Physical Therapy

## 2021-12-15 DIAGNOSIS — J90 Pleural effusion, not elsewhere classified: Secondary | ICD-10-CM

## 2021-12-15 NOTE — Telephone Encounter (Signed)
Dr Verlee Monte please advise:  ? ? ?Patient calling in and states she has fluid on her right lung, patient states she is very short of breath at this time with little activity.Pt had a Thoracentesis last Wednesday.   Not available between 10:00 - 2:00. Patient phone number is [phone], Offered an appt with an NP however she prefers seeing a MD.  ?

## 2021-12-15 NOTE — Telephone Encounter (Signed)
I have called the patient and she is aware that we are waiting on the approval from the [facility] and getting the procedure set up.  ?

## 2021-12-15 NOTE — Telephone Encounter (Addendum)
Crazy question. Can I do her PleurX at [facility] on Wednesday? And how would we go about setting her up with home health care for it and ensuring she has supplies, etc on outpatient basis? Another option would be to potentially try to do it at 7am or 7:30am at [facility] next Tues, Wed, or Thurs. If none of these options work then could just order thora at [facility] in [place] to buy some time. ? ?Spoke with patient and discussed these potential options. She is aware that if she develops sentence dyspnea she needs to go to ED for it. ? ?Thanks!! ?

## 2021-12-15 NOTE — Telephone Encounter (Signed)
Please advise on note below.   

## 2021-12-16 ENCOUNTER — Telehealth: Payer: Self-pay

## 2021-12-16 ENCOUNTER — Other Ambulatory Visit: Payer: Self-pay

## 2021-12-16 DIAGNOSIS — R7989 Other specified abnormal findings of blood chemistry: Secondary | ICD-10-CM

## 2021-12-16 DIAGNOSIS — E8809 Other disorders of plasma-protein metabolism, not elsewhere classified: Secondary | ICD-10-CM

## 2021-12-16 DIAGNOSIS — Z9884 Bariatric surgery status: Secondary | ICD-10-CM

## 2021-12-16 DIAGNOSIS — E46 Unspecified protein-calorie malnutrition: Secondary | ICD-10-CM

## 2021-12-16 NOTE — Progress Notes (Signed)
Inform patient biopsies show no abnormalities- my concern is she is not absorbing enough with her gastric bypass- lets proceed with TPN and I will discuss with her doctors if they have any other thoughts

## 2021-12-16 NOTE — Telephone Encounter (Signed)
Called patient to let her know that Dr. Vicente Males wants to have a video visit with her and that he wants her to have labs drawn. Patient agreed to both. ?

## 2021-12-16 NOTE — Telephone Encounter (Signed)
I called Cardiopulmonary at [facility] - [phone] and [phone] and couldn't get thru on first # and phone just rings at second # - already left for the day??  I spoke to [place] at [facility] about possibly Tues, Wed or Thurs next week and she asked me to call back tomorrow to check on them.  ?

## 2021-12-17 ENCOUNTER — Encounter (HOSPITAL_COMMUNITY): Payer: Medicare Other | Admitting: Physical Therapy

## 2021-12-17 ENCOUNTER — Encounter: Payer: Self-pay | Admitting: Student

## 2021-12-17 ENCOUNTER — Telehealth (INDEPENDENT_AMBULATORY_CARE_PROVIDER_SITE_OTHER): Payer: Medicare Other | Admitting: Gastroenterology

## 2021-12-17 DIAGNOSIS — E8809 Other disorders of plasma-protein metabolism, not elsewhere classified: Secondary | ICD-10-CM

## 2021-12-17 DIAGNOSIS — E43 Unspecified severe protein-calorie malnutrition: Secondary | ICD-10-CM | POA: Diagnosis not present

## 2021-12-17 DIAGNOSIS — R188 Other ascites: Secondary | ICD-10-CM | POA: Diagnosis not present

## 2021-12-17 LAB — SURGICAL PATHOLOGY

## 2021-12-17 NOTE — Progress Notes (Addendum)
?  ?Jonathon Bellows , MD ?[address]  ?[address]  ?[place], [place] [address]  ?Main: [phone]  ?Fax: [phone] ? ? ?Primary Care Physician: [facility], [facility] ? ?Virtual Visit via Video Note ? ?I connected with patient on 12/17/21 at 12:30 PM EDT by video and verified that I am speaking with the correct person using two identifiers. ?  ?I discussed the limitations, risks, security and privacy concerns of performing an evaluation and management service by video  and the availability of in person appointments. I also discussed with the patient that there may be a patient responsible charge related to this service. The patient expressed understanding and agreed to proceed. ? ?Location of Patient: Home ?Location of Provider: Home ?Persons involved: Patient and provider only ? ? ?History of Present Illness: ?Chief Complaint  ?Patient presents with  ? Malnutrition Screening Tool  ? ? ?HPI: [name] is a 71 y.o. female ? ?Summary of history : ?  ?Initially referred and seen on 01/08/2021 for vomiting after eating.  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 2019 she was seen by [facility] and underwent hemorrhoidal banding.  .Does complain of heartburn despite being on famotidine.  Previously was on pantoprazole.  He says it was stopped due to concern about long-term usage.   Seen by the cancer center for iron deficiency anemia in March 2022 hemoglobin 13.1 g has received IV iron. ?  ?01/08/2021: Tested positive H. pylori.  Treated with quadruple based bismuth regimen. ?02/04/2021: Hemoglobin 13.5 g, ferritin 213, B12 normal, folate normal, CMP normal ?01/28/2021: EGD: Features of gastric bypass seen otherwise procedure was normal.  Colonoscopy also performed on the same day.  2 sessile polyps 10 to 12 mm were resected colon there were tubular adenomas repeat colonoscopy in 3 years  ?03/19/2021: H. pylori  breath test negative  ?05/29/2021: Hemoglobin 12.1 g.  Albumin 2.5, ALT 30, AST 31 ?10/03/2021: Ultrasound abdomen showed small amount of ascites bilateral pleural effusions.  ?08/06/2021 echo shows normal study normal LV function  ?  ?Interval history 10/30/2021-12/17/2021 ? ?12/12/2021: EGD and colonoscopy performed with biopsies of the stomach and duodenum jejunum: Taken.  No evidence of duodenitis or celiac disease.  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  Terminal ileum appeared normal no evidence of microscopic colitis.  I have requested stain for amyloidosis. ?And has been negative for amyloidosis.  I referred her to [facility] for second opinion. ?For any reason for the low albumin and they also agreed with my assessment that her liver disease was not the cause of her hypoalbuminemia and effusions suggested endoscopy and colonoscopy to rule out any protein-losing enteropathy which we have performed as above ? ?  ? ? ? ?Current Outpatient Medications  ?Medication Sig Dispense Refill  ? acetaminophen-codeine (TYLENOL #2) 300-15 MG tablet Take 1 tablet by mouth every 6 (six) hours as needed.    ? albuterol (VENTOLIN HFA) 108 (90 Base) MCG/ACT inhaler Inhale 2 puffs into the lungs in the morning, at noon, in the evening, and at bedtime.    ? calcitRIOL (ROCALTROL) 0.5 MCG capsule Take 0.5 mcg by mouth daily.  5  ? Cholecalciferol ([employer] D3) 125 MCG (5000 UT) capsule Take 1 tablet by mouth daily.    ? denosumab (PROLIA) 60 MG/ML SOSY injection Inject 60 mg into the skin every 6 (six) months.    ? diltiazem (CARDIZEM) 30 MG tablet Take 1 tablet (  30 mg total) by mouth 2 (two) times daily. 60 tablet 2  ? doxycycline (VIBRA-TABS) 100 MG tablet Take by mouth.    ? furosemide (LASIX) 20 MG tablet Take 1 tablet (20 mg total) by mouth daily. 30 tablet 0  ? levothyroxine (SYNTHROID) 25 MCG tablet Take 25 mcg by mouth every morning.    ? Melatonin 10 MG TABS Take 1 tablet by mouth as needed.    ? Multiple Vitamin  (M.V.I. ADULT IV) Take 1 tablet by mouth daily.    ? pantoprazole (PROTONIX) 20 MG tablet Take 1 tablet (20 mg total) by mouth daily. 90 tablet 3  ? pantoprazole (PROTONIX) 40 MG tablet Take by mouth.    ? potassium chloride SA (KLOR-CON M20) 20 MEQ tablet Take by mouth.    ? Vitamin A 2400 MCG (8000 UT) CAPS Take 1 capsule by mouth daily.    ? Vitamin E 180 MG (400 UNIT) CAPS Take 1 capsule by mouth daily.    ? ?Current Facility-Administered Medications  ?Medication Dose Route Frequency Provider Last Rate Last Admin  ? albumin human 25 % solution 25 g  25 g Intravenous Once Jonathon Bellows, MD      ? ? ?Allergies as of 12/17/2021 - Review Complete 12/12/2021  ?Allergen Reaction Noted  ? Zoledronic acid Other (See Comments) 09/02/2021  ? Azithromycin Diarrhea 08/15/2014  ? Lac bovis Diarrhea 08/15/2014  ? Prednisone Diarrhea 08/15/2014  ? Sulfa antibiotics Itching 08/15/2014  ? Tape Dermatitis 02/24/2018  ? ? ?Review of Systems:    ?All systems reviewed and negative except where noted in HPI.  ?General Appearance:    Alert, cooperative, no distress, appears stated age  ?Head:    Normocephalic, without obvious abnormality, atraumatic  ?Eyes:    PERRL, conjunctiva/corneas clear,  ?Ears:    Grossly normal hearing   ? ?Neurologic:  Grossly normal   ? ?Observations/Objective: ? ?Labs: ?CMP  ?   ?Component Value Date/Time  ? NA 141 11/10/2021 1512  ? NA 142 10/07/2021 1456  ? K 4.4 11/10/2021 1512  ? CL 109 11/10/2021 1512  ? CO2 24 11/10/2021 1512  ? GLUCOSE 92 11/10/2021 1512  ? BUN 11 11/10/2021 1512  ? BUN 7 (L) 10/07/2021 1456  ? CREATININE 0.64 11/10/2021 1512  ? CALCIUM 7.3 (L) 11/10/2021 1512  ? PROT 4.7 (L) 11/10/2021 1512  ? PROT 3.9 (LL) 10/07/2021 1456  ? ALBUMIN 2.0 (L) 11/10/2021 1512  ? ALBUMIN 2.1 (L) 10/07/2021 1456  ? AST 57 (H) 11/10/2021 1512  ? ALT 35 11/10/2021 1512  ? ALKPHOS 86 11/10/2021 1512  ? BILITOT 1.0 11/10/2021 1512  ? BILITOT 0.5 10/07/2021 1456  ? GFRNONAA >60 11/10/2021 1512  ? GFRAA >60  05/02/2020 1308  ? ?Lab Results  ?Component Value Date  ? WBC 5.4 11/10/2021  ? HGB 12.6 11/10/2021  ? HCT 39.5 11/10/2021  ? MCV 98.3 11/10/2021  ? PLT 227 11/10/2021  ? ? ?Imaging Studies: ?DG Chest 2 View ? ?Result Date: 12/04/2021 ?CLINICAL DATA:  71 year old female with pleural effusion EXAM: CHEST - 2 VIEW COMPARISON:  11/27/2021 FINDINGS: Cardiomediastinal silhouette unchanged. Right heart border partially obscured by overlying lung/pleural disease. No interlobular septal thickening. Persisting opacity at the right lung base with obscuration the right hemidiaphragm and the right heart border. Blunting at the left costophrenic angle is unchanged. Meniscus on the lateral view. Thickening of the fissures. Coarsened interstitial markings with no new confluent airspace disease. Degenerative changes of the spine.  No displaced fracture.  IMPRESSION: Small right pleural effusion with associated atelectasis/consolidation. Trace left-sided pleural effusion. Electronically Signed   By: Corrie Mckusick D.O.   On: 12/04/2021 16:14  ? ?DG Chest 2 View ? ?Result Date: 11/29/2021 ?CLINICAL DATA:  Follow-up right pleural effusion. EXAM: CHEST - 2 VIEW COMPARISON:  11/10/2021 FINDINGS: Stable normal sized heart. Small to moderate-sized right pleural effusion, decreased. Interval small left pleural effusion. Associated mild right basilar atelectasis. Cholecystectomy clips. Thoracolumbar spine kyphoplasty material. IMPRESSION: 1. Small to moderate-sized right pleural effusion, decreased. 2. Interval small left pleural effusion. Electronically Signed   By: Claudie Revering M.D.   On: 11/29/2021 15:03  ? ?CT KNEE RIGHT WO CONTRAST ? ?Result Date: 11/17/2021 ?CLINICAL DATA:  Closed nondisplaced fracture of right patella, unspecified fracture morphology, initial encounter S82.001A (ICD-10-CM) EXAM: CT OF THE RIGHT KNEE WITHOUT CONTRAST TECHNIQUE: Multidetector CT imaging of the right knee was performed according to the standard protocol.  Multiplanar CT image reconstructions were also generated. RADIATION DOSE REDUCTION: This exam was performed according to the departmental dose-optimization program which includes automated exposure control, adj

## 2021-12-17 NOTE — Telephone Encounter (Signed)
I spoke to [name] at [facility] this morning.  She states they can do Pleurx next Tues, Wed or Thurs at [facility] but will need to be between 2:30-4:00.  They don't do them that early in the mornings.  I also asked her about home health and supplies.  She states they would contact caseworker prior to procedure and there is paperwork for Dr Verlee Monte to complete and all of this would be in the works by the time she has procedure.  She states everyone does not qualify for the home home but they would be able to get her the supplies to last for a little while.  Dr Verlee Monte can you do the procedure one of those afternoons next week?  When would you like me to get it scheduled?  If thoracentesis needs to be done order will need to be placed. ?

## 2021-12-18 ENCOUNTER — Telehealth: Payer: Self-pay | Admitting: Gastroenterology

## 2021-12-18 ENCOUNTER — Ambulatory Visit (HOSPITAL_COMMUNITY)
Admission: RE | Admit: 2021-12-18 | Discharge: 2021-12-18 | Disposition: A | Discharge status: Home / Self Care | Payer: Medicare Other | Source: Ambulatory Visit | Attending: Radiology | Admitting: Radiology

## 2021-12-18 ENCOUNTER — Ambulatory Visit (HOSPITAL_COMMUNITY)
Admission: RE | Admit: 2021-12-18 | Discharge: 2021-12-18 | Disposition: A | Discharge status: Home / Self Care | Payer: Medicare Other | Source: Ambulatory Visit | Attending: Student | Admitting: Student

## 2021-12-18 ENCOUNTER — Encounter (HOSPITAL_COMMUNITY): Payer: Self-pay

## 2021-12-18 ENCOUNTER — Telehealth: Payer: Self-pay

## 2021-12-18 DIAGNOSIS — J90 Pleural effusion, not elsewhere classified: Secondary | ICD-10-CM

## 2021-12-18 DIAGNOSIS — R790 Abnormal level of blood mineral: Secondary | ICD-10-CM

## 2021-12-18 DIAGNOSIS — J9811 Atelectasis: Secondary | ICD-10-CM | POA: Insufficient documentation

## 2021-12-18 MED ORDER — MAGNESIUM 200 MG PO TABS
200.0000 mg | ORAL_TABLET | Freq: Three times a day (TID) | ORAL | 0 refills | Status: AC
Start: 2021-12-18 — End: 2021-12-25

## 2021-12-18 NOTE — Telephone Encounter (Signed)
See phone note from 12/15/21.  ?

## 2021-12-18 NOTE — Procedures (Signed)
? ?  US guided Rt thoracentesis ? ?Total 1 liter yellow fluid obtained ?No labs per MD ? ?CXR: No PTX per Dr Thornton Papas ? ?See full dictation for full explanation of procedure ?

## 2021-12-18 NOTE — Telephone Encounter (Signed)
Sorry for the run around. Sounds like it's going to be hard to coordinate PleurX in the next couple of weeks. I called her and told her I would order thoracentesis to be done at [facility] to take care of her symptoms now. We also agreed we'd try to find a later time to do PleurX.  ? ?Judeen Hammans I have ordered US thoracentesis to be done as soon as can be scheduled for her at [facility]. Would we also be able to schedule PleurX catheter placement any day of May 1 through May 4 at [facility] at any time of day? ? ?Thanks for all your hard work on this!! ? ? ?

## 2021-12-18 NOTE — Telephone Encounter (Signed)
Pt has been scheduled for thoracentesis today at 1:15 at [facility].  PleurX catheter placement has been scheduled for 5/1 at 2:30 at [facility].  [place] in End[facility] said they would contact case manager who will see if home health can be done and supplies will be ordered the day of procedure and they will go over teaching and supplies with pt on that date.  I spoke to pt & she is aware of all info.   ?

## 2021-12-18 NOTE — Telephone Encounter (Signed)
Order requested for a pickline to be put in to patient. Will be done at [facility].  ?Carolynn Sayers with [employer] [phone] ?As soon as possible. ?

## 2021-12-18 NOTE — Telephone Encounter (Signed)
Sent medication to the pharmacy and Order magnesium for 1 week. Patient verbalized understanding of results  ?

## 2021-12-18 NOTE — Telephone Encounter (Signed)
In secure chat message yesterday Dr Verlee Monte stated [name] at [facility] were working on this for him.  I just spoke to charge nurse Santiago Glad and she states [facility] can not do out pt Pleurx.  It will have to be done at [facility] and only openings she sees are in the afternoon - will need to contact Earl at [facility] if must be in the a.m..  Dr Verlee Monte what would you like to do? ?

## 2021-12-18 NOTE — Telephone Encounter (Signed)
-----   Message from Jonathon Bellows, MD sent at 12/18/2021  8:16 AM EDT ----- ?Inform magnesium low ? ?Start on magnesium oxide 200 mg TID for 7 days . If has diarrhea to take imodium . Recheck magensium in a week ?

## 2021-12-18 NOTE — Progress Notes (Signed)
PT tolerated right sided thoracentesis procedure well today and 1 Liter of clear yellow fluid removed. Pt taken via wheelchair to chest xray post procedure and it was read by radiologist prior to discharge. PT verbalized understanding of discharge instructions and left with no acute distress noted.  ?

## 2021-12-19 ENCOUNTER — Other Ambulatory Visit (HOSPITAL_COMMUNITY): Payer: Self-pay | Admitting: Gastroenterology

## 2021-12-19 ENCOUNTER — Other Ambulatory Visit (HOSPITAL_COMMUNITY): Payer: Self-pay | Admitting: *Deleted

## 2021-12-19 ENCOUNTER — Ambulatory Visit
Admission: RE | Admit: 2021-12-19 | Discharge: 2021-12-19 | Disposition: A | Discharge status: Home / Self Care | Payer: Self-pay | Source: Ambulatory Visit | Attending: Gastroenterology | Admitting: Gastroenterology

## 2021-12-19 ENCOUNTER — Telehealth: Payer: Self-pay | Admitting: Gastroenterology

## 2021-12-19 ENCOUNTER — Ambulatory Visit (HOSPITAL_COMMUNITY)
Admission: RE | Admit: 2021-12-19 | Discharge: 2021-12-19 | Disposition: A | Discharge status: Home / Self Care | Payer: Medicare Other | Source: Ambulatory Visit | Attending: Gastroenterology | Admitting: Gastroenterology

## 2021-12-19 ENCOUNTER — Other Ambulatory Visit: Payer: Self-pay | Admitting: Pulmonary Disease

## 2021-12-19 ENCOUNTER — Encounter (HOSPITAL_COMMUNITY): Payer: Medicare Other

## 2021-12-19 DIAGNOSIS — I5042 Chronic combined systolic (congestive) and diastolic (congestive) heart failure: Secondary | ICD-10-CM

## 2021-12-19 DIAGNOSIS — Z9884 Bariatric surgery status: Secondary | ICD-10-CM | POA: Insufficient documentation

## 2021-12-19 DIAGNOSIS — E8809 Other disorders of plasma-protein metabolism, not elsewhere classified: Secondary | ICD-10-CM

## 2021-12-19 DIAGNOSIS — K912 Postsurgical malabsorption, not elsewhere classified: Secondary | ICD-10-CM | POA: Insufficient documentation

## 2021-12-19 DIAGNOSIS — E46 Unspecified protein-calorie malnutrition: Secondary | ICD-10-CM | POA: Insufficient documentation

## 2021-12-19 DIAGNOSIS — J90 Pleural effusion, not elsewhere classified: Secondary | ICD-10-CM

## 2021-12-19 MED ORDER — SODIUM CHLORIDE 0.9% FLUSH: 10.0000 mL | Freq: Two times a day (BID) | INTRAVENOUS | Status: DC

## 2021-12-19 MED ORDER — CHLORHEXIDINE GLUCONATE CLOTH 2 % EX PADS: 6.0000 | MEDICATED_PAD | Freq: Every day | CUTANEOUS | Status: DC

## 2021-12-19 MED ORDER — SODIUM CHLORIDE 0.9% FLUSH: 10.0000 mL | INTRAVENOUS | Status: DC | PRN

## 2021-12-19 NOTE — Telephone Encounter (Signed)
Thanks all

## 2021-12-19 NOTE — Telephone Encounter (Signed)
Picc line ?

## 2021-12-19 NOTE — Progress Notes (Unsigned)
1120 Arrived at the radiology dept to Place PICC. No PICC orders found ?

## 2021-12-19 NOTE — Progress Notes (Signed)
Peripherally Inserted Central Catheter Placement ? ?The IV Nurse has discussed with the patient and/or persons authorized to consent for the patient, the purpose of this procedure and the potential benefits and risks involved with this procedure.  The benefits include less needle sticks, lab draws from the catheter, and the patient may be discharged home with the catheter. Risks include, but not limited to, infection, bleeding, blood clot (thrombus formation), and puncture of an artery; nerve damage and irregular heartbeat and possibility to perform a PICC exchange if needed/ordered by physician.  Alternatives to this procedure were also discussed.  Bard Power PICC patient education guide, fact sheet on infection prevention and patient information card has been provided to patient /or left at bedside.   ? ?PICC Placement Documentation  ?PICC Double Lumen 95/18/84 Right Basilic 166 cm 0 cm (Active)  ?Indication for Insertion or Continuance of Line Administration of hyperosmolar/irritating solutions (i.e. TPN, Vancomycin, etc.) 12/19/21 1344  ?Site Assessment Clean, Dry, Intact 12/19/21 1344  ?Lumen #1 Status Flushed;Saline locked;Blood return noted 12/19/21 1344  ?Lumen #2 Status Saline locked;Blood return noted;Flushed 12/19/21 1344  ?Dressing Type Securing device;Transparent 12/19/21 1344  ?Dressing Status Antimicrobial disc in place 12/19/21 1344  ?Safety Lock Not Applicable 03/14/15 0109  ?Dressing Change Due 12/26/21 12/19/21 1344  ? ? ? ? ? ?[name] ?12/19/2021, 1:47 PM ? ?

## 2021-12-20 LAB — VITAMIN B1: Thiamine: 126.9 nmol/L (ref 66.5–200.0)

## 2021-12-20 LAB — CBC WITH DIFFERENTIAL/PLATELET
Basophils Absolute: 0 10*3/uL (ref 0.0–0.2)
Basos: 1 %
EOS (ABSOLUTE): 0 10*3/uL (ref 0.0–0.4)
Eos: 1 %
Hematocrit: 41.1 % (ref 34.0–46.6)
Hemoglobin: 12.8 g/dL (ref 11.1–15.9)
Immature Grans (Abs): 0 10*3/uL (ref 0.0–0.1)
Immature Granulocytes: 0 %
Lymphocytes Absolute: 1.1 10*3/uL (ref 0.7–3.1)
Lymphs: 19 %
MCH: 30.2 pg (ref 26.6–33.0)
MCHC: 31.1 g/dL — ABNORMAL LOW (ref 31.5–35.7)
MCV: 97 fL (ref 79–97)
Monocytes Absolute: 0.4 10*3/uL (ref 0.1–0.9)
Monocytes: 8 %
Neutrophils Absolute: 4 10*3/uL (ref 1.4–7.0)
Neutrophils: 71 %
Platelets: 229 10*3/uL (ref 150–450)
RBC: 4.24 x10E6/uL (ref 3.77–5.28)
RDW: 13.5 % (ref 11.7–15.4)
WBC: 5.6 10*3/uL (ref 3.4–10.8)

## 2021-12-20 LAB — VITAMIN E
Vitamin E (Alpha Tocopherol): 8.1 mg/L — ABNORMAL LOW (ref 9.0–29.0)
Vitamin E(Gamma Tocopherol): 0.3 mg/L — ABNORMAL LOW (ref 0.5–4.9)

## 2021-12-20 LAB — COMPREHENSIVE METABOLIC PANEL
ALT: 22 IU/L (ref 0–32)
AST: 45 IU/L — ABNORMAL HIGH (ref 0–40)
Albumin/Globulin Ratio: 1.1 — ABNORMAL LOW (ref 1.2–2.2)
Albumin: 2.2 g/dL — ABNORMAL LOW (ref 3.8–4.8)
Alkaline Phosphatase: 113 IU/L (ref 44–121)
BUN/Creatinine Ratio: 11 — ABNORMAL LOW (ref 12–28)
BUN: 6 mg/dL — ABNORMAL LOW (ref 8–27)
Bilirubin Total: 0.5 mg/dL (ref 0.0–1.2)
CO2: 23 mmol/L (ref 20–29)
Calcium: 6.7 mg/dL — CL (ref 8.7–10.3)
Chloride: 104 mmol/L (ref 96–106)
Creatinine, Ser: 0.55 mg/dL — ABNORMAL LOW (ref 0.57–1.00)
Globulin, Total: 2 g/dL (ref 1.5–4.5)
Glucose: 83 mg/dL (ref 70–99)
Potassium: 4.1 mmol/L (ref 3.5–5.2)
Sodium: 139 mmol/L (ref 134–144)
Total Protein: 4.2 g/dL — CL (ref 6.0–8.5)
eGFR: 100 mL/min/{1.73_m2} (ref >59)

## 2021-12-20 LAB — VITAMIN D 25 HYDROXY (VIT D DEFICIENCY, FRACTURES): Vit D, 25-Hydroxy: 50.8 ng/mL (ref 30.0–100.0)

## 2021-12-20 LAB — PREALBUMIN: PREALBUMIN: 9 mg/dL — ABNORMAL LOW (ref 10–36)

## 2021-12-20 LAB — COPPER, SERUM: Copper: 71 ug/dL — ABNORMAL LOW (ref 80–158)

## 2021-12-20 LAB — FOLATE: Folate: 12.4 ng/mL (ref >3.0)

## 2021-12-20 LAB — VITAMIN A: Vitamin A: 15.5 ug/dL — ABNORMAL LOW (ref 22.0–69.5)

## 2021-12-20 LAB — VITAMIN K1, SERUM: VITAMIN K1: <0.1 ng/mL — ABNORMAL LOW (ref 0.10–2.20)

## 2021-12-20 LAB — TRIGLYCERIDES: Triglycerides: 44 mg/dL (ref 0–149)

## 2021-12-20 LAB — MAGNESIUM: Magnesium: 1.4 mg/dL — ABNORMAL LOW (ref 1.6–2.3)

## 2021-12-20 LAB — VITAMIN B12: Vitamin B-12: 925 pg/mL (ref 232–1245)

## 2021-12-20 LAB — ZINC: Zinc: 44 ug/dL (ref 44–115)

## 2021-12-22 ENCOUNTER — Ambulatory Visit: Payer: Self-pay

## 2021-12-22 ENCOUNTER — Other Ambulatory Visit: Payer: Self-pay

## 2021-12-22 ENCOUNTER — Encounter (HOSPITAL_COMMUNITY): Payer: Medicare Other | Admitting: Physical Therapy

## 2021-12-22 ENCOUNTER — Telehealth: Payer: Self-pay | Admitting: Gastroenterology

## 2021-12-22 NOTE — Telephone Encounter (Signed)
Patient needs prior auth done for home medication. Requesting call  ?

## 2021-12-23 ENCOUNTER — Encounter: Payer: Self-pay | Admitting: Gastroenterology

## 2021-12-24 ENCOUNTER — Encounter (HOSPITAL_COMMUNITY): Payer: Medicare Other | Admitting: Physical Therapy

## 2021-12-25 NOTE — Telephone Encounter (Signed)
Called and talk to [facility] patient got the Picc line place on Friday. They went out to her house on Friday afternoon and gave her the TPN. The patient is doing well on this. [place] beach [place] will return next week from the beach. She states that the patient is trying to request that nurse come to the house to give the TPN every day but her insurance will cover this on any case. Pam states that her and her husband are very capable of doing this but has some anxiety about it. When they get back from the beach they will go out to the house to help one more time and show them everything  ?

## 2021-12-26 ENCOUNTER — Encounter (HOSPITAL_COMMUNITY): Payer: Medicare Other | Admitting: Physical Therapy

## 2021-12-29 ENCOUNTER — Other Ambulatory Visit (HOSPITAL_COMMUNITY): Payer: Self-pay | Admitting: *Deleted

## 2021-12-29 DIAGNOSIS — D500 Iron deficiency anemia secondary to blood loss (chronic): Secondary | ICD-10-CM

## 2021-12-29 DIAGNOSIS — K909 Intestinal malabsorption, unspecified: Secondary | ICD-10-CM

## 2021-12-30 NOTE — Telephone Encounter (Signed)
Will resolve with her appt next week. Looks to have seen several doctors, ER visit, multiple tests recently. Need to sit down and go through everything in detail ? ?Zandra Abts MD ?

## 2022-01-01 ENCOUNTER — Encounter: Payer: Self-pay | Admitting: *Deleted

## 2022-01-06 ENCOUNTER — Encounter: Payer: Self-pay | Admitting: Student

## 2022-01-06 ENCOUNTER — Telehealth: Payer: Self-pay | Admitting: Student

## 2022-01-06 ENCOUNTER — Encounter: Payer: Self-pay | Admitting: Cardiology

## 2022-01-06 ENCOUNTER — Ambulatory Visit (INDEPENDENT_AMBULATORY_CARE_PROVIDER_SITE_OTHER): Payer: Medicare Other | Admitting: Cardiology

## 2022-01-06 VITALS — BP 90/64 | HR 90 | Ht 64.0 in | Wt 142.2 lb

## 2022-01-06 DIAGNOSIS — R0602 Shortness of breath: Secondary | ICD-10-CM

## 2022-01-06 DIAGNOSIS — R600 Localized edema: Secondary | ICD-10-CM

## 2022-01-06 MED ORDER — DILTIAZEM HCL 30 MG PO TABS: 30.0000 mg | ORAL_TABLET | Freq: Two times a day (BID) | ORAL | 2 refills | Status: DC | PRN

## 2022-01-06 NOTE — Patient Instructions (Addendum)
Medication Instructions:  ?Your physician has recommended you make the following change in your medication:  ?Stop lasix ?Take diltiazem 30 mg twice a day as needed ? ?Labwork: ?none ? ?Testing/Procedures: ?none ? ?Follow-Up: ?Your physician recommends that you schedule a follow-up appointment in: 4 months ? ?Any Other Special Instructions Will Be Listed Below (If Applicable). ? ?Please call the office or send a mychart message on Friday with a log of your home blood pressure readings. ? ?If you need a refill on your cardiac medications before your next appointment, please call your pharmacy. ? ?

## 2022-01-06 NOTE — Progress Notes (Signed)
? ? ? ?Clinical Summary ?[name] is a 71 y.o.female seen today for follow up of the following medical problems.  ? ?1.Palpitations ?- prior monitors have shown PACs and atrial runs ?-08/2021 monitor occasional PACs, frequent runs of SVT up to 29 seconds. Two runs of NSVT longest 8 beats. Symptoms correalted with PACs and SVT.  ?  ?- has been on diltiazem ? ?- infrequent palpitations.  ?  ?2. Chest pain ?- 07/2021 nuclear stress: no ischemia ?- chest pains have resolved ? ?ER visit 12/08/21 with negative workup for ACS ?- no recurrent symptoms ?  ?  ?3. SOB/Edema ?- ongoign eval by pumlonary ?- benign stress test ?- [facility] echo low normal to mildly decreased LVEF 62-22%, grade I diastolic dysfunction. She is on oral lasix. Volme status difficult to assess due to body habitus.  ?- could be related to her PACs and PSVT ? ?-orthostatic symptoms and fall on higher diuretic dosing, see 11/26/21 phone call ? 11/2021 pro BNP  --->Pro BNP 1025 --> 383 ?- - issues with hypothyroidism ?- albumin 2.2, from notes thought that liver disease is not cause. 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 ?- swelling much improved with TPN ?- wt 142 lbs, in Jan 2023 177 lbs ?  ?5. Pleural effusion ?- noted 08/2021 xray by pulmonary ? ? ? ?  ?Past Medical History:  ?Diagnosis Date  ? Asthma   ? CHF (congestive heart failure) ([place])   ? GERD (gastroesophageal reflux disease)   ? Internal hemorrhoids   ? Iron deficiency anemia   ? Osteoporosis   ? Peptic ulcer   ? Tubular adenoma of colon   ? Vertigo   ? ? ? ?Allergies  ?Allergen Reactions  ? Zoledronic Acid Other (See Comments)  ?  Other reaction(s): fever and flu-like symptoms  ? Azithromycin Diarrhea  ?  Other reaction(s): diarrhea  ? Lac Bovis Diarrhea  ? Prednisone Diarrhea  ? Sulfa Antibiotics Itching  ? Tape Dermatitis  ? ? ? ?Current Outpatient Medications  ?Medication Sig Dispense Refill  ? acetaminophen-codeine (TYLENOL #2) 300-15 MG tablet Take 1 tablet by mouth  every 6 (six) hours as needed.    ? albuterol (VENTOLIN HFA) 108 (90 Base) MCG/ACT inhaler Inhale 2 puffs into the lungs in the morning, at noon, in the evening, and at bedtime.    ? calcitRIOL (ROCALTROL) 0.5 MCG capsule Take 0.5 mcg by mouth daily.  5  ? Cholecalciferol ([facility]S D3) 125 MCG (5000 UT) capsule Take 1 tablet by mouth daily.    ? denosumab (PROLIA) 60 MG/ML SOSY injection Inject 60 mg into the skin every 6 (six) months.    ? diltiazem (CARDIZEM) 30 MG tablet Take 1 tablet (30 mg total) by mouth 2 (two) times daily. 60 tablet 2  ? doxycycline (VIBRA-TABS) 100 MG tablet Take by mouth.    ? furosemide (LASIX) 20 MG tablet Take 1 tablet (20 mg total) by mouth daily. 30 tablet 0  ? levothyroxine (SYNTHROID) 25 MCG tablet Take 25 mcg by mouth every morning.    ? Melatonin 10 MG TABS Take 1 tablet by mouth as needed.    ? Multiple Vitamin (M.V.I. ADULT IV) Take 1 tablet by mouth daily.    ? pantoprazole (PROTONIX) 20 MG tablet Take 1 tablet (20 mg total) by mouth daily. 90 tablet 3  ? pantoprazole (PROTONIX) 40 MG tablet Take by mouth.    ? potassium chloride SA (KLOR-CON M20) 20 MEQ tablet Take by  mouth.    ? Vitamin A 2400 MCG (8000 UT) CAPS Take 1 capsule by mouth daily.    ? Vitamin E 180 MG (400 UNIT) CAPS Take 1 capsule by mouth daily.    ? ?Current Facility-Administered Medications  ?Medication Dose Route Frequency Provider Last Rate Last Admin  ? albumin human 25 % solution 25 g  25 g Intravenous Once Jonathon Bellows, MD      ? ? ? ?Past Surgical History:  ?Procedure Laterality Date  ? BACK SURGERY    ? BREAST SURGERY    ? CHOLECYSTECTOMY    ? COLONOSCOPY WITH PROPOFOL N/A 06/02/2017  ? Procedure: COLONOSCOPY WITH PROPOFOL;  Surgeon: Jonathon Bellows, MD;  Location[facility] ENDOSCOPY;  Service: Gastroenterology;  Laterality: N/A;  ? COLONOSCOPY WITH PROPOFOL N/A 01/28/2021  ? Procedure: COLONOSCOPY WITH PROPOFOL;  Surgeon: Jonathon Bellows, MD;  Location: [facility] ENDOSCOPY;  Service: Gastroenterology;  Laterality: N/A;  ?  COLONOSCOPY WITH PROPOFOL N/A 12/12/2021  ? Procedure: COLONOSCOPY WITH PROPOFOL;  Surgeon: Jonathon Bellows, MD;  Location: [facility] ENDOSCOPY;  Service: Gastroenterology;  Laterality: N/A;  Patient is requesting to be last since she lives in [place].  ? ESOPHAGOGASTRODUODENOSCOPY (EGD) WITH PROPOFOL N/A 07/01/2017  ? Procedure: ESOPHAGOGASTRODUODENOSCOPY (EGD) WITH PROPOFOL;  Surgeon: Jonathon Bellows, MD;  Location: [facility] ENDOSCOPY;  Service: Gastroenterology;  Laterality: N/A;  ? ESOPHAGOGASTRODUODENOSCOPY (EGD) WITH PROPOFOL N/A 01/28/2021  ? Procedure: ESOPHAGOGASTRODUODENOSCOPY (EGD) WITH PROPOFOL;  Surgeon: Jonathon Bellows, MD;  Location: [facility] ENDOSCOPY;  Service: Gastroenterology;  Laterality: N/A;  ? ESOPHAGOGASTRODUODENOSCOPY (EGD) WITH PROPOFOL N/A 12/12/2021  ? Procedure: ESOPHAGOGASTRODUODENOSCOPY (EGD) WITH PROPOFOL;  Surgeon: Jonathon Bellows, MD;  Location: [facility] ENDOSCOPY;  Service: Gastroenterology;  Laterality: N/A;  ? FLEXIBLE SIGMOIDOSCOPY N/A 07/01/2017  ? Procedure: FLEXIBLE SIGMOIDOSCOPY;  Surgeon: Jonathon Bellows, MD;  Location: [facility] ENDOSCOPY;  Service: Gastroenterology;  Laterality: N/A;  ? KNEE SURGERY    ? mini gastric bypass    ? SHOULDER SURGERY    ? TONSILLECTOMY    ? ? ? ?Allergies  ?Allergen Reactions  ? Zoledronic Acid Other (See Comments)  ?  Other reaction(s): fever and flu-like symptoms  ? Azithromycin Diarrhea  ?  Other reaction(s): diarrhea  ? Lac Bovis Diarrhea  ? Prednisone Diarrhea  ? Sulfa Antibiotics Itching  ? Tape Dermatitis  ? ? ? ? ?Family History  ?Problem Relation Age of Onset  ? Hypertension Other   ? Stroke Other   ? Diabetes Other   ? Heart attack Other   ? Obesity Other   ? Diabetes Father   ? Heart disease Father   ? Colon cancer Father   ? Breast cancer Paternal Grandmother   ? Healthy Daughter   ? Stomach cancer Neg Hx   ? Pancreatic cancer Neg Hx   ? ? ? ?Social History ?[name] reports that she has never smoked. She has never used smokeless tobacco. ?[name] reports no history of  alcohol use. ? ? ?Review of Systems ?CONSTITUTIONAL: No weight loss, fever, chills, weakness or fatigue.  ?HEENT: Eyes: No visual loss, blurred vision, double vision or yellow sclerae.No hearing loss, sneezing, congestion, runny nose or sore throat.  ?SKIN: No rash or itching.  ?CARDIOVASCULAR: per hpi ?RESPIRATORY: No shortness of breath, cough or sputum.  ?GASTROINTESTINAL: No anorexia, nausea, vomiting or diarrhea. No abdominal pain or blood.  ?GENITOURINARY: No burning on urination, no polyuria ?NEUROLOGICAL: No headache, dizziness, syncope, paralysis, ataxia, numbness or tingling in the extremities. No change in bowel or bladder control.  ?MUSCULOSKELETAL: No muscle,  back pain, joint pain or stiffness.  ?LYMPHATICS: No enlarged nodes. No history of splenectomy.  ?PSYCHIATRIC: No history of depression or anxiety.  ?ENDOCRINOLOGIC: No reports of sweating, cold or heat intolerance. No polyuria or polydipsia.  ?. ? ? ?Physical Examination ?Today's Vitals  ? 01/06/22 0768  ?BP: 90/64  ?Pulse: 90  ?SpO2: 98%  ?Weight: 142 lb 3.2 oz (64.5 kg)  ?Height: '5\' 4"'$  (1.626 m)  ? ?Body mass index is 24.41 kg/m?. ? ?Gen: resting comfortably, no acute distress ?HEENT: no scleral icterus, pupils equal round and reactive, no palptable cervical adenopathy,  ?CV: RRR, no m/r/g no jvd ?Resp: Clear to auscultation bilaterally ?GI: abdomen is soft, non-tender, non-distended, normal bowel sounds, no hepatosplenomegaly ?MSK: extremities are warm, 1+ bilateral LE edema ?Skin: warm, no rash ?Neuro:  no focal deficits ?Psych: appropriate affect ? ? ?Diagnostic Studies ?08/2021 heart monitor ?10 day monitor ?Occasional supraventricular ectopy in the form of isolated PACs, rare couplets and triplets. Frequent runs of SVT longest 29.2 seconds. ?Rare ventricular ectopy in the form of isolated PVCs, couplets. 2 runs of NSVT longest 8 beats. ?Reported symptoms correlated with sinus rhythm with PACs and short runs of SVT. ?  ?  ?07/2021 nuclear  stress ?  The study is normal. The study is low risk. ?  No ST deviation was noted. ?  LV perfusion is normal. ?  Left ventricular function is normal. End diastolic cavity size is normal. ?  ?07/2021 [facility]

## 2022-01-07 ENCOUNTER — Encounter: Payer: Self-pay | Admitting: Gastroenterology

## 2022-01-07 NOTE — Telephone Encounter (Signed)
Dr Verlee Monte spoke to pt thru [name] yesterday & looks like procedure has been cancelled.  Nothing further needed. ?

## 2022-01-08 ENCOUNTER — Encounter: Payer: Self-pay | Admitting: Internal Medicine

## 2022-01-08 ENCOUNTER — Encounter: Payer: Self-pay | Admitting: Gastroenterology

## 2022-01-12 ENCOUNTER — Encounter (HOSPITAL_COMMUNITY): Payer: Self-pay

## 2022-01-12 ENCOUNTER — Encounter: Payer: Self-pay | Admitting: Gastroenterology

## 2022-01-12 ENCOUNTER — Ambulatory Visit (HOSPITAL_COMMUNITY): Admit: 2022-01-12 | Payer: Medicare Other | Admitting: Student

## 2022-01-12 ENCOUNTER — Telehealth: Payer: Self-pay | Admitting: Cardiology

## 2022-01-12 SURGERY — INSERTION, PLEURAL DRAINAGE CATHETER
Anesthesia: LOCAL

## 2022-01-12 NOTE — Telephone Encounter (Signed)
Follow Up: ? ? ? ? ?Dr Harl Bowie told patient to call back with her blood pressure readings. ? ?01-08-22 -  at 7:15  AM 97/57 pulse 83 ? 01-09-22 -  at 7:05 AM  84/57 pulse 84 ?  01-10-22    at 7:00 AM   88/70 pulse 85 ?   01-11-22    at 1:00 PM   100/68 pulse  107 ?  01-12-22        at 2:00 PM   108/64 pulse 102 ?

## 2022-01-15 ENCOUNTER — Other Ambulatory Visit: Payer: Self-pay | Admitting: Nurse Practitioner

## 2022-01-15 DIAGNOSIS — I890 Lymphedema, not elsewhere classified: Secondary | ICD-10-CM

## 2022-01-15 DIAGNOSIS — I5032 Chronic diastolic (congestive) heart failure: Secondary | ICD-10-CM

## 2022-01-15 MED ORDER — MIDODRINE HCL 2.5 MG PO TABS: 2.5000 mg | ORAL_TABLET | Freq: Two times a day (BID) | ORAL | 6 refills | Status: DC

## 2022-01-15 NOTE — Telephone Encounter (Signed)
Patient notified and verbalized understanding.   ? ?Will send new prescription to [facility] now.   ?

## 2022-01-15 NOTE — Telephone Encounter (Signed)
Can she start midodrine 2.'5mg'$  bid and update [place] on bp's in 2 weeks. Would be ok top bp number 90s, 80s is gettign low and risk for dizziness of falling, particularly as she continues to lose more fluid. This medicine helps keep the blood pressure up ? ?Zandra Abts MD ?

## 2022-01-15 NOTE — Telephone Encounter (Signed)
Called and spoke with pt about refill requests and recs per [place]. Pt said she went to see her PCP about 2 weeks ago and was told by PCP that she didn't need to take lasix anymore. Meds have been d/c'd. Nothing further needed. ?

## 2022-01-15 NOTE — Telephone Encounter (Signed)
Katie, please advise if you are okay refilling meds or if this needs to be deferred to either PCP or cardiology. ?

## 2022-01-15 NOTE — Telephone Encounter (Signed)
Her potassium looks like it was slightly elevated on most recent BMET. She needs a repeat BMET drawn. After that, can refill x1 but then she needs to follow up with her PCP or whoever manages her lymphedema as they will need to routinely monitor her labs. Thanks.

## 2022-01-22 ENCOUNTER — Ambulatory Visit (INDEPENDENT_AMBULATORY_CARE_PROVIDER_SITE_OTHER): Payer: Medicare Other | Admitting: Neurology

## 2022-01-22 ENCOUNTER — Encounter: Payer: Self-pay | Admitting: Gastroenterology

## 2022-01-22 ENCOUNTER — Encounter: Payer: Self-pay | Admitting: Neurology

## 2022-01-22 VITALS — BP 105/68 | HR 87 | Ht 64.0 in | Wt 154.5 lb

## 2022-01-22 DIAGNOSIS — D329 Benign neoplasm of meninges, unspecified: Secondary | ICD-10-CM | POA: Diagnosis not present

## 2022-01-22 DIAGNOSIS — G6289 Other specified polyneuropathies: Secondary | ICD-10-CM

## 2022-01-22 MED ORDER — GABAPENTIN 300 MG PO CAPS: 600.0000 mg | ORAL_CAPSULE | Freq: Three times a day (TID) | ORAL | 3 refills | Status: DC

## 2022-01-22 NOTE — Patient Instructions (Signed)
Continue current medications ?Continue gabapentin 600 mg 3 times daily ?Continue to elevate your leg at night, can use compressive device at night ?Continue to follow-up with your primary care doctor ?Follow-up with me as scheduled in February 2024.  Please contact me if you have symptoms do not improve. ?

## 2022-01-22 NOTE — Progress Notes (Signed)
Needs office visit in 6-10 weeks

## 2022-01-22 NOTE — Progress Notes (Signed)
? ?[facility] ? ?PATIENT: [name] ?DOB: May 16, 1951 ? ?REQUESTING CLINICIAN: Wille Glaser, PA-C ?HISTORY FROM: Patient  ?REASON FOR VISIT: Abnormal MRI Results  ? ? ?HISTORICAL ? ?CHIEF COMPLAINT:  ?Chief Complaint  ?Patient presents with  ? New Patient (Initial Visit)  ?  Rm 14. Alone. ?NX Pryce Folts LV 2023/[employer]/[facility] family/Tiffany McGee PA/tingling in extremities.  ? ?INTERVAL HISTORY 01/22/2022:  ?Patient presents today for follow-up for peripheral neuropathy.  Since last visit she did see many doctors and was finally diagnosed with low albumin that is causing her edema.  She said for the past year she has been dealing with edema in the bilateral lower extremities, think it was lymphedema because it affected the lower extremities, she was wrapping her legs but her symptoms were not improving. Finally when she was diagnosed with low albumin and started getting replacement therapy she noticed improvement of the edema.  She still having edema in the lower extremities but improved.  She has been complaining of numbness and tingling that started when the edema of the lower extremity started, she felt the numbness was due to the swelling.  But after improvement if the swelling in the lower extremities, the numbness and tingling and pain is still present.  She feels most of the pain at night.  She has started gabapentin 600 mg TID with some relief.  She is presenting today hoping to see if there is anything else that can be done to help with the numbness and tingling.  ? ? ?HISTORY OF PRESENT ILLNESS:  ?This is a 71 year old woman past medical history of obesity status post gastric bypass, acquired lymphedema, dizziness and dyspnea who is presenting after an abnormal MRI.  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  In terms of the dizziness she reported previous history of dizziness, was diagnosed with a semicircular canal dehiscence which required surgery.  After the surgery she was doing well and until about a year ago when she is having intermittent dizziness.  She did follow-up with the ENT who ordered the MRI and ENT was planning to order a temporal bone CT scan if the MRI is unrevealing.  Overall she feels like her health is declining, she has kidney cyst, liver issue and now she reports fluid in the abdomen and lungs and worsening left hearing loss.  She also has acquired lymphedema in the lower extremity that limits her mobility.  She denies any headaches, denies any change in vision and no focal weakness.   ? ? ? ?OTHER MEDICAL CONDITIONS: Obesity status post gastric bypass surgery, acquired lymphedema due to low albumin, dizziness, and dyspnea. ? ? ?REVIEW OF SYSTEMS: Full 14 system review of systems performed and negative with exception of: As noted in the HPI. ? ?ALLERGIES: ?Allergies  ?Allergen Reactions  ? Zoledronic Acid Other (See Comments)  ?  Other reaction(s): fever and flu-like symptoms  ? Azithromycin Diarrhea  ?  Other reaction(s): diarrhea  ? Lac Bovis Diarrhea  ? Prednisone Diarrhea  ? Sulfa Antibiotics Itching  ? Tape Dermatitis  ? ? ?HOME MEDICATIONS: ?Outpatient Medications Prior to Visit  ?Medication Sig Dispense Refill  ? calcitRIOL (ROCALTROL) 0.5 MCG capsule Take 0.5 mcg by mouth daily.  5  ? Cholecalciferol ([facility]S D3) 125 MCG (5000 UT) capsule Take 1 tablet by mouth daily.    ?  denosumab (PROLIA) 60 MG/ML SOSY injection Inject 60 mg into the skin every 6 (six) months.    ? levothyroxine (SYNTHROID) 25 MCG tablet Take 25 mcg by mouth every morning.    ? Melatonin 10 MG TABS Take 1 tablet by mouth as needed.    ? midodrine (PROAMATINE) 2.5 MG tablet Take 1 tablet (2.5 mg total) by mouth 2 (two) times daily. 60 tablet 6  ? Multiple Vitamin (M.V.I. ADULT IV) Take 1 tablet by mouth daily.     ? pantoprazole (PROTONIX) 20 MG tablet Take 1 tablet (20 mg total) by mouth daily. 90 tablet 3  ? Vitamin A 2400 MCG (8000 UT) CAPS Take 1 capsule by mouth daily.    ? Vitamin E 180 MG (400 UNIT) CAPS Take 1 capsule by mouth daily.    ? gabapentin (NEURONTIN) 300 MG capsule Take 300 mg by mouth 3 (three) times daily.    ? acetaminophen-codeine (TYLENOL #2) 300-15 MG tablet Take 1 tablet by mouth every 6 (six) hours as needed.    ? albuterol (VENTOLIN HFA) 108 (90 Base) MCG/ACT inhaler Inhale 2 puffs into the lungs in the morning, at noon, in the evening, and at bedtime.    ? diltiazem (CARDIZEM) 30 MG tablet Take 1 tablet (30 mg total) by mouth 2 (two) times daily as needed. 60 tablet 2  ? pantoprazole (PROTONIX) 40 MG tablet Take by mouth. (Patient not taking: Reported on 01/06/2022)    ? ?Facility-Administered Medications Prior to Visit  ?Medication Dose Route Frequency Provider Last Rate Last Admin  ? albumin human 25 % solution 25 g  25 g Intravenous Once [name], MD      ? ? ?PAST MEDICAL HISTORY: ?Past Medical History:  ?Diagnosis Date  ? Asthma   ? CHF (congestive heart failure) ([facility])   ? GERD (gastroesophageal reflux disease)   ? Internal hemorrhoids   ? Iron deficiency anemia   ? Osteoporosis   ? Peptic ulcer   ? Tubular adenoma of colon   ? Vertigo   ? ? ?PAST SURGICAL HISTORY: ?Past Surgical History:  ?Procedure Laterality Date  ? BACK SURGERY    ? BREAST SURGERY    ? CHOLECYSTECTOMY    ? COLONOSCOPY WITH PROPOFOL N/A 06/02/2017  ? Procedure: COLONOSCOPY WITH PROPOFOL;  Surgeon: [name], MD;  Location: [facility] ENDOSCOPY;  Service: Gastroenterology;  Laterality: N/A;  ? COLONOSCOPY WITH PROPOFOL N/A 01/28/2021  ? Procedure: COLONOSCOPY WITH PROPOFOL;  Surgeon: [name], MD;  Location: [facility] ENDOSCOPY;  Service: Gastroenterology;  Laterality: N/A;  ? COLONOSCOPY WITH PROPOFOL N/A 12/12/2021  ? Procedure: COLONOSCOPY WITH PROPOFOL;  Surgeon: [name], MD;  Location: [facility] ENDOSCOPY;  Service:  Gastroenterology;  Laterality: N/A;  Patient is requesting to be last since she lives in [place].  ? ESOPHAGOGASTRODUODENOSCOPY (EGD) WITH PROPOFOL N/A 07/01/2017  ? Procedure: ESOPHAGOGASTRODUODENOSCOPY (EGD) WITH PROPOFOL;  Surgeon: [name], MD;  Location: [facility] ENDOSCOPY;  Service: Gastroenterology;  Laterality: N/A;  ? ESOPHAGOGASTRODUODENOSCOPY (EGD) WITH PROPOFOL N/A 01/28/2021  ? Procedure: ESOPHAGOGASTRODUODENOSCOPY (EGD) WITH PROPOFOL;  Surgeon: [name], MD;  Location: [facility] ENDOSCOPY;  Service: Gastroenterology;  Laterality: N/A;  ? ESOPHAGOGASTRODUODENOSCOPY (EGD) WITH PROPOFOL N/A 12/12/2021  ? Procedure: ESOPHAGOGASTRODUODENOSCOPY (EGD) WITH PROPOFOL;  Surgeon: [name], MD;  Location: [facility] ENDOSCOPY;  Service: Gastroenterology;  Laterality: N/A;  ? FLEXIBLE SIGMOIDOSCOPY N/A 07/01/2017  ? Procedure: FLEXIBLE SIGMOIDOSCOPY;  Surgeon: [name], MD;  Location: [facility] ENDOSCOPY;  Service: Gastroenterology;  Laterality: N/A;  ? KNEE SURGERY    ?  mini gastric bypass    ? SHOULDER SURGERY    ? TONSILLECTOMY    ? ? ?FAMILY HISTORY: ?Family History  ?Problem Relation Age of Onset  ? Hypertension Other   ? Stroke Other   ? Diabetes Other   ? Heart attack Other   ? Obesity Other   ? Diabetes Father   ? Heart disease Father   ? Colon cancer Father   ? Breast cancer Paternal Grandmother   ? Healthy Daughter   ? Stomach cancer Neg Hx   ? Pancreatic cancer Neg Hx   ? ? ?SOCIAL HISTORY: ?Social History  ? ?Socioeconomic History  ? Marital status: Married  ?  Spouse name: Not on file  ? Number of children: Not on file  ? Years of education: Not on file  ? Highest education level: Not on file  ?Occupational History  ? Not on file  ?Tobacco Use  ? Smoking status: Never  ? Smokeless tobacco: Never  ?Vaping Use  ? Vaping Use: Never used  ?Substance and Sexual Activity  ? Alcohol use: No  ?  Alcohol/week: 0.0 standard drinks  ? Drug use: Never  ? Sexual activity: Never  ?Other Topics Concern  ? Not on file  ?Social  History Narrative  ? Not on file  ? ?Social Determinants of Health  ? ?Financial Resource Strain: Not on file  ?Food Insecurity: Not on file  ?Transportation Needs: Not on file  ?Physical Activity: Not on file  ?Stre

## 2022-01-26 ENCOUNTER — Telehealth (INDEPENDENT_AMBULATORY_CARE_PROVIDER_SITE_OTHER): Payer: Medicare Other | Admitting: Gastroenterology

## 2022-01-26 ENCOUNTER — Other Ambulatory Visit (HOSPITAL_COMMUNITY): Payer: Medicare Other

## 2022-01-26 ENCOUNTER — Other Ambulatory Visit: Payer: Self-pay

## 2022-01-26 DIAGNOSIS — E8809 Other disorders of plasma-protein metabolism, not elsewhere classified: Secondary | ICD-10-CM | POA: Diagnosis not present

## 2022-01-26 DIAGNOSIS — E43 Unspecified severe protein-calorie malnutrition: Secondary | ICD-10-CM | POA: Diagnosis not present

## 2022-01-26 DIAGNOSIS — M6281 Muscle weakness (generalized): Secondary | ICD-10-CM

## 2022-01-26 DIAGNOSIS — Z789 Other specified health status: Secondary | ICD-10-CM

## 2022-01-26 NOTE — Progress Notes (Signed)
?  ?[name] , MD ?[address]  ?[address]  ?[place], [place] [address]  ?Main: [phone]  ?Fax: [phone] ? ? ?Primary Care Physician: Janine Limbo, PA-C ? ?Virtual Visit via Video Note ? ?I connected with patient on 01/26/22 at  3:00 PM EDT by video and verified that I am speaking with the correct person using two identifiers. ?  ?I discussed the limitations, risks, security and privacy concerns of performing an evaluation and management service by video  and the availability of in person appointments. I also discussed with the patient that there may be a patient responsible charge related to this service. The patient expressed understanding and agreed to proceed. ? ?Location of Patient: Home ?Location of Provider: Home ?Persons involved: Patient and provider only ? ? ?History of Present Illness: ?Chief Complaint  ?Patient presents with  ? Follow-up  ? ? ?HPI: [name] is a 71 y.o. female ? ?Summary of history : ?  ?Initially referred and seen on 01/08/2021 for vomiting after eating.  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 2019 she was seen by [facility] and underwent hemorrhoidal banding.  .Does complain of heartburn despite being on famotidine.  Previously was on pantoprazole.  Seen by the cancer center for iron deficiency anemia in March 2022 hemoglobin 13.1 g has received IV iron. ?  ?01/08/2021: Tested positive H. pylori.  Treated with quadruple based bismuth regimen. ?02/04/2021: Hemoglobin 13.5 g, ferritin 213, B12 normal, folate normal, CMP normal ?01/28/2021: EGD: Features of gastric bypass seen otherwise procedure was normal.  Colonoscopy also performed on the same day.  2 sessile polyps 10 to 12 mm were resected colon there were tubular adenomas repeat colonoscopy in 3 years  ?03/19/2021: H. pylori breath test negative  ?10/03/2021: Ultrasound abdomen showed small amount of ascites  bilateral pleural effusions.  ?08/06/2021 echo shows normal study normal LV function  ?12/12/2021: EGD and colonoscopy performed with biopsies of the stomach and duodenum jejunum: Taken.  No evidence of duodenitis or celiac disease.  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  Terminal ileum appeared normal no evidence of microscopic colitis . No evidence of amyloidosis ?I referred her to [facility] for second opinion to help figure out the cause for the low albumin and they also agreed with my assessment that her liver disease was not the cause of her hypoalbuminemia and effusions suggested endoscopy and colonoscopy to rule out any protein-losing enteropathy which we have performed as above.  The process of illumination the only obvious cause for the low albumin was the gastric bypass ? ?Interval history 12/17/2021-01/26/2022 ? ?Commenced on TPN on 12/23/2021. ? ?Feeling much better.  Doing well improved mobility losing fluid around her legs, she finally went to church a few days back.  Her hair is growing better.  Her TPN infusion has been reduced by pharmacy as she has been gaining weight.  No new complaints. ? ? ? ? ?Current Outpatient Medications  ?Medication Sig Dispense Refill  ? calcitRIOL (ROCALTROL) 0.5 MCG capsule Take 0.5 mcg by mouth daily.  5  ? Cholecalciferol ([employer] D3) 125 MCG (5000 UT) capsule Take 1 tablet by mouth daily.    ? denosumab (PROLIA) 60 MG/ML SOSY injection Inject 60 mg into the skin every 6 (six) months.    ? gabapentin (NEURONTIN) 300 MG capsule Take 2 capsules (600 mg total) by mouth 3 (three) times daily. 180 capsule 3  ?  levothyroxine (SYNTHROID) 25 MCG tablet Take 25 mcg by mouth every morning.    ? magnesium oxide (MAG-OX) 400 (240 Mg) MG tablet Take 0.5 tablets by mouth 4 (four) times daily.    ? Melatonin 10 MG TABS Take 1 tablet by mouth as needed.    ? midodrine (PROAMATINE) 2.5 MG tablet Take 1 tablet (2.5 mg total) by mouth 2 (two) times daily. 60 tablet 6  ?  Multiple Vitamin (M.V.I. ADULT IV) Take 1 tablet by mouth daily.    ? pantoprazole (PROTONIX) 20 MG tablet Take 1 tablet (20 mg total) by mouth daily. 90 tablet 3  ? Vitamin A 2400 MCG (8000 UT) CAPS Take 1 capsule by mouth daily.    ? Vitamin E 180 MG (400 UNIT) CAPS Take 1 capsule by mouth daily.    ? ?Current Facility-Administered Medications  ?Medication Dose Route Frequency Provider Last Rate Last Admin  ? albumin human 25 % solution 25 g  25 g Intravenous Once Tiffany Bellows, MD      ? ? ?Allergies as of 01/26/2022 - Review Complete 01/22/2022  ?Allergen Reaction Noted  ? Zoledronic acid Other (See Comments) 09/02/2021  ? Azithromycin Diarrhea 08/15/2014  ? Lac bovis Diarrhea 08/15/2014  ? Prednisone Diarrhea 08/15/2014  ? Sulfa antibiotics Itching 08/15/2014  ? Tape Dermatitis 02/24/2018  ? ? ?Review of Systems:    ?All systems reviewed and negative except where noted in HPI.  ?General Appearance:    Alert, cooperative, no distress, appears stated age  ?Head:    Normocephalic, without obvious abnormality, atraumatic  ?Eyes:    PERRL, conjunctiva/corneas clear,  ?Ears:    Grossly normal hearing   ? ?Neurologic:  Grossly normal   ? ?Observations/Objective: ? ?Labs: ?CMP  ?   ?Component Value Date/Time  ? NA 139 12/17/2021 0859  ? K 4.1 12/17/2021 0859  ? CL 104 12/17/2021 0859  ? CO2 23 12/17/2021 0859  ? GLUCOSE 83 12/17/2021 0859  ? GLUCOSE 92 11/10/2021 1512  ? BUN 6 (L) 12/17/2021 0859  ? CREATININE 0.55 (L) 12/17/2021 0859  ? CALCIUM 6.7 (LL) 12/17/2021 0859  ? PROT 4.2 (LL) 12/17/2021 0859  ? ALBUMIN 2.2 (L) 12/17/2021 0859  ? AST 45 (H) 12/17/2021 0859  ? ALT 22 12/17/2021 0859  ? ALKPHOS 113 12/17/2021 0859  ? BILITOT 0.5 12/17/2021 0859  ? GFRNONAA >60 11/10/2021 1512  ? GFRAA >60 05/02/2020 1308  ? ?Lab Results  ?Component Value Date  ? WBC 5.6 12/17/2021  ? HGB 12.8 12/17/2021  ? HCT 41.1 12/17/2021  ? MCV 97 12/17/2021  ? PLT 229 12/17/2021  ? ? ?Imaging Studies: ?No results found. ? ?Assessment and  Plan:  ? ?[name] is a 71 y.o. y/o femalewith a past medical history of Roux-en-Y gastric bypass, H. pylori infection, status posttreatment.  Prior history of nausea vomiting resolved with commencement of pantoprazole .   EGD did not demonstrate any anastomotic ulcers.  She was found to have a very low serum albumin .  Very minimal abdominal ascites but she has had pleural effusions.  Low albumin in the ascites fluid.  The degree of her pleural effusion was disproportionate to her ascites which makes it less likely to develop a hepatic hydrothorax.  She was found to have very low t T3 levels and high TSH.  Hypothyroidism may have contribute dto this.  I had referred her to [facility] for second opinion who also felt liver was not the cause of low albumin.  Subsequently through the process of illumination figured out that it is probably the gastric bypass that is the cause of the low albumin and commenced on TPN.  Did not think an enteral feeding tube would help due to the gastric bypass and make the cause diarrhea.  She has subsequently gained weight, effusions have resolved, serum albumin has improved to over 3 hemoglobin has improved and she feels much better overall.  We will continue with TPN for now and I will refer her to [facility] to evaluate if a redo of the gastric bypass can be considered or any other option for long-term nutritional support.  In the interim she should continue with her lab work to monitor electrolytes.  I will also refer her to physical therapy to improve her muscle strength, eventual goal will be to get her off TPN if reversal surgery is possible if not we should see if she can do with improved enteral intake once her thyroid levels have stabilized.  Last option would be for TPN at nighttime for a few hours long-term.  We will also refer to a dietitian to help with goals of TPN ? ?  ?I discussed the assessment and treatment plan with the patient. The patient was provided an opportunity to  ask questions and all were answered. The patient agreed with the plan and demonstrated an understanding of the instructions. ?  ?The patient was advised to call back or seek an in-person evaluation if the symptoms wo

## 2022-01-28 ENCOUNTER — Encounter: Payer: Self-pay | Admitting: Gastroenterology

## 2022-01-28 NOTE — Addendum Note (Signed)
Addended by: Wayna Chalet on: 01/28/2022 10:39 AM ? ? Modules accepted: Orders ? ?

## 2022-02-01 NOTE — Progress Notes (Signed)
[address] [place], [place] [address]   CLINIC:  Medical Oncology/Hematology  PCP:  Janine Limbo, PA-C [place] [address] [phone]   REASON FOR VISIT:  Follow-up for iron deficiency anemia  PRIOR THERAPY: None  CURRENT THERAPY: Intermittent IV iron (last Feraheme on 09/19/2019)  INTERVAL HISTORY:  [name] 71 y.o. female returns for routine follow-up of her iron deficiency anemia.  She was last seen by Tarri Abernethy PA-C on 06/05/2021.  At today's visit, she reports feeling fair.  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  She was experiencing third spacing of fluid and requiring weekly thoracentesis.  She now has PICC line placed and receives nutrition, IV supplements, and albumin infusions as needed.  She is doing much better, but reports that she has not yet recovered all of her strength and functionality.  She denies any signs of blood loss such as bright red blood per rectum, melena, epistaxis, or hematuria.  She has some fatigue with energy about 70%, improved but not yet at baseline.  She has episodes of lightheadedness.  She denies any pica, restless legs, headaches, chest pain, dyspnea on exertion, or syncope.  She has 70% energy and 75% appetite. She has had weight fluctuations secondary to fluid overload.    REVIEW OF SYSTEMS:  Review of Systems  Constitutional:  Positive for fatigue. Negative for appetite change, chills, diaphoresis, fever and unexpected weight change.  HENT:   Negative for lump/mass and nosebleeds.   Eyes:  Negative for eye problems.  Respiratory:  Negative for cough, hemoptysis and shortness of breath.   Cardiovascular:  Negative for chest pain, leg swelling and palpitations.  Gastrointestinal:  Negative for abdominal  pain, blood in stool, constipation, diarrhea, nausea and vomiting.  Genitourinary:  Negative for hematuria.   Musculoskeletal:  Positive for arthralgias.  Skin: Negative.   Neurological:  Positive for light-headedness and numbness. Negative for dizziness and headaches.  Hematological:  Does not bruise/bleed easily.     PAST MEDICAL/SURGICAL HISTORY:  Past Medical History:  Diagnosis Date   Asthma    CHF (congestive heart failure) (HCC)    GERD (gastroesophageal reflux disease)    Internal hemorrhoids    Iron deficiency anemia    Osteoporosis    Peptic ulcer    Tubular adenoma of colon    Vertigo    Past Surgical History:  Procedure Laterality Date   BACK SURGERY     BREAST SURGERY     CHOLECYSTECTOMY     COLONOSCOPY WITH PROPOFOL N/A 06/02/2017   Procedure: COLONOSCOPY WITH PROPOFOL;  Surgeon: Jonathon Bellows, MD;  Location: [facility] ENDOSCOPY;  Service: Gastroenterology;  Laterality: N/A;   COLONOSCOPY WITH PROPOFOL N/A 01/28/2021   Procedure: COLONOSCOPY WITH PROPOFOL;  Surgeon: Jonathon Bellows, MD;  Location: [facility] ENDOSCOPY;  Service: Gastroenterology;  Laterality: N/A;   COLONOSCOPY WITH PROPOFOL N/A 12/12/2021   Procedure: COLONOSCOPY WITH PROPOFOL;  Surgeon: Jonathon Bellows, MD;  Location: [facility] ENDOSCOPY;  Service: Gastroenterology;  Laterality: N/A;  Patient is requesting to be last since she lives in [place].   ESOPHAGOGASTRODUODENOSCOPY (EGD) WITH PROPOFOL N/A 07/01/2017   Procedure: ESOPHAGOGASTRODUODENOSCOPY (EGD) WITH PROPOFOL;  Surgeon: Jonathon Bellows, MD;  Location: [facility] ENDOSCOPY;  Service: Gastroenterology;  Laterality: N/A;   ESOPHAGOGASTRODUODENOSCOPY (EGD) WITH PROPOFOL N/A 01/28/2021   Procedure: ESOPHAGOGASTRODUODENOSCOPY (EGD)  WITH PROPOFOL;  Surgeon: Jonathon Bellows, MD;  Location: [facility] ENDOSCOPY;  Service: Gastroenterology;  Laterality: N/A;   ESOPHAGOGASTRODUODENOSCOPY (EGD) WITH PROPOFOL N/A 12/12/2021   Procedure: ESOPHAGOGASTRODUODENOSCOPY (EGD) WITH PROPOFOL;  Surgeon: Jonathon Bellows,  MD;  Location: [facility] ENDOSCOPY;  Service: Gastroenterology;  Laterality: N/A;   FLEXIBLE SIGMOIDOSCOPY N/A 07/01/2017   Procedure: FLEXIBLE SIGMOIDOSCOPY;  Surgeon: Jonathon Bellows, MD;  Location: [facility] ENDOSCOPY;  Service: Gastroenterology;  Laterality: N/A;   KNEE SURGERY     mini gastric bypass     SHOULDER SURGERY     TONSILLECTOMY       SOCIAL HISTORY:  Social History   Socioeconomic History   Marital status: Married    Spouse name: Not on file   Number of children: Not on file   Years of education: Not on file   Highest education level: Not on file  Occupational History   Not on file  Tobacco Use   Smoking status: Never   Smokeless tobacco: Never  Vaping Use   Vaping Use: Never used  Substance and Sexual Activity   Alcohol use: No    Alcohol/week: 0.0 standard drinks   Drug use: Never   Sexual activity: Never  Other Topics Concern   Not on file  Social History Narrative   Not on file   Social Determinants of Health   Financial Resource Strain: Not on file  Food Insecurity: Not on file  Transportation Needs: Not on file  Physical Activity: Not on file  Stress: Not on file  Social Connections: Not on file  Intimate Partner Violence: Not on file    FAMILY HISTORY:  Family History  Problem Relation Age of Onset   Hypertension Other    Stroke Other    Diabetes Other    Heart attack Other    Obesity Other    Diabetes Father    Heart disease Father    Colon cancer Father    Breast cancer Paternal Grandmother    Healthy Daughter    Stomach cancer Neg Hx    Pancreatic cancer Neg Hx     CURRENT MEDICATIONS:  Outpatient Encounter Medications as of 02/02/2022  Medication Sig   calcitRIOL (ROCALTROL) 0.5 MCG capsule Take 0.5 mcg by mouth daily.   Cholecalciferol (CVS D3) 125 MCG (5000 UT) capsule Take 1 tablet by mouth daily.   denosumab (PROLIA) 60 MG/ML SOSY injection Inject 60 mg into the skin every 6 (six) months.   gabapentin (NEURONTIN) 300 MG capsule  Take 2 capsules (600 mg total) by mouth 3 (three) times daily.   levothyroxine (SYNTHROID) 25 MCG tablet Take 25 mcg by mouth every morning.   Melatonin 10 MG TABS Take 1 tablet by mouth as needed.   Multiple Vitamin (M.V.I. ADULT IV) Take 1 tablet by mouth daily.   pantoprazole (PROTONIX) 20 MG tablet Take 1 tablet (20 mg total) by mouth daily.   Vitamin A 2400 MCG (8000 UT) CAPS Take 1 capsule by mouth daily.   Vitamin E 180 MG (400 UNIT) CAPS Take 1 capsule by mouth daily.   [DISCONTINUED] magnesium oxide (MAG-OX) 400 (240 Mg) MG tablet Take 0.5 tablets by mouth 4 (four) times daily.   [DISCONTINUED] midodrine (PROAMATINE) 2.5 MG tablet Take 1 tablet (2.5 mg total) by mouth 2 (two) times daily.   Facility-Administered Encounter Medications as of 02/02/2022  Medication   albumin human 25 % solution 25 g    ALLERGIES:  Allergies  Allergen Reactions   Zoledronic Acid Other (See Comments)  Other reaction(s): fever and flu-like symptoms   Azithromycin Diarrhea    Other reaction(s): diarrhea   Lac Bovis Diarrhea   Prednisone Diarrhea   Sulfa Antibiotics Itching   Tape Dermatitis     PHYSICAL EXAM:  ECOG PERFORMANCE STATUS: 2 - Symptomatic, <50% confined to bed  Vitals:   02/02/22 1004  BP: 108/66  Pulse: 86  Resp: 16  Temp: 97.8 F (36.6 C)  SpO2: 100%   Filed Weights   02/02/22 1004  Weight: 155 lb 10.3 oz (70.6 kg)   Physical Exam Constitutional:      Appearance: Normal appearance.  HENT:     Head: Normocephalic and atraumatic.     Mouth/Throat:     Mouth: Mucous membranes are moist.  Eyes:     Extraocular Movements: Extraocular movements intact.     Pupils: Pupils are equal, round, and reactive to light.  Cardiovascular:     Rate and Rhythm: Normal rate and regular rhythm.     Pulses: Normal pulses.     Heart sounds: Normal heart sounds.  Pulmonary:     Effort: Pulmonary effort is normal.     Breath sounds: Normal breath sounds.  Abdominal:     General:  Bowel sounds are normal.     Palpations: Abdomen is soft.     Tenderness: There is no abdominal tenderness.  Musculoskeletal:        General: No swelling.     Right lower leg: Edema present.     Left lower leg: Edema present.     Comments: Chronic lymphedema  Lymphadenopathy:     Cervical: No cervical adenopathy.  Skin:    General: Skin is warm and dry.  Neurological:     General: No focal deficit present.     Mental Status: She is alert and oriented to person, place, and time.  Psychiatric:        Mood and Affect: Mood normal.        Behavior: Behavior normal.     LABORATORY DATA:  I have reviewed the labs as listed.  CBC    Component Value Date/Time   WBC 5.6 12/17/2021 0859   WBC 5.4 11/10/2021 1512   RBC 4.24 12/17/2021 0859   RBC 4.02 11/10/2021 1512   HGB 12.8 12/17/2021 0859   HCT 41.1 12/17/2021 0859   PLT 229 12/17/2021 0859   MCV 97 12/17/2021 0859   MCH 30.2 12/17/2021 0859   MCH 31.3 11/10/2021 1512   MCHC 31.1 (L) 12/17/2021 0859   MCHC 31.9 11/10/2021 1512   RDW 13.5 12/17/2021 0859   LYMPHSABS 1.1 12/17/2021 0859   MONOABS 0.5 11/10/2021 1512   EOSABS 0.0 12/17/2021 0859   BASOSABS 0.0 12/17/2021 0859      Latest Ref Rng & Units 12/17/2021    8:59 AM 11/10/2021    3:12 PM 10/20/2021    9:59 AM  CMP  Glucose 70 - 99 mg/dL 83   92   82    BUN 8 - 27 mg/dL '6   11   9    '$ Creatinine 0.57 - 1.00 mg/dL 0.55   0.64   0.56    Sodium 134 - 144 mmol/L 139   141   143    Potassium 3.5 - 5.2 mmol/L 4.1   4.4   3.4    Chloride 96 - 106 mmol/L 104   109   108    CO2 20 - 29 mmol/L 23   24   28  Calcium 8.7 - 10.3 mg/dL 6.7   7.3   6.9    Total Protein 6.0 - 8.5 g/dL 4.2   4.7     Total Bilirubin 0.0 - 1.2 mg/dL 0.5   1.0     Alkaline Phos 44 - 121 IU/L 113   86     AST 0 - 40 IU/L 45   57     ALT 0 - 32 IU/L 22   35       DIAGNOSTIC IMAGING:  I have independently reviewed the relevant imaging and discussed with the patient.  ASSESSMENT & PLAN: 1.   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  Roux-en-Y gastrojejunostomy with gastrojejunal anastomosis characterized by congestion, edema, and erythema.  Normal colon. - She receives intermittent IV Feraheme, most recently on 09/29/2019.  She reports that her energy improves after IV iron. - She denies any epistaxis, hematemesis, hematochezia, melena   - Most recent CBCs show Hgb 10.7 on 12/29/2021, which dropped to Hgb 9.5 on 01/27/2022. - Iron panel (12/23/2021) show ferritin 208, low transferrin 108, low TIBC 138, and normal iron saturation 27%.  Labs consistent with anemia of chronic disease. - Additional labs (12/23/2021) show borderline low copper at 52 ug/dL and borderline low folate at 4.0 ng/mL.  Vitamin D3 normal at 39.3.  Vitamin B12 normal at 586, but methylmalonic acid not checked. - PLAN: No indication for IV iron at this time  - Recommend daily copper supplement 2 mg and daily folic acid 244 mcg supplement - Repeat labs with phone visit in 3 months, with CBC, CMP, ferritin, iron/TIBC, B12/methylmalonic acid, folate, copper, and SPEP.   All questions were answered. The patient knows to call the clinic with any problems, questions or concerns.  Medical decision making: Moderate  Time spent on visit: I spent 20 minutes counseling the patient face to face. The total time spent in the appointment was 30 minutes and more than 50% was on counseling.   Harriett Rush, PA-C  02/02/2022 10:52 AM

## 2022-02-02 ENCOUNTER — Inpatient Hospital Stay (HOSPITAL_COMMUNITY): Payer: Medicare Other | Attending: Internal Medicine | Admitting: Physician Assistant

## 2022-02-02 ENCOUNTER — Other Ambulatory Visit (HOSPITAL_COMMUNITY): Payer: Self-pay | Admitting: Physician Assistant

## 2022-02-02 ENCOUNTER — Other Ambulatory Visit (HOSPITAL_COMMUNITY): Payer: Medicare Other

## 2022-02-02 VITALS — BP 108/66 | HR 86 | Temp 97.8°F | Resp 16 | Ht 64.0 in | Wt 155.6 lb

## 2022-02-02 DIAGNOSIS — K909 Intestinal malabsorption, unspecified: Secondary | ICD-10-CM

## 2022-02-02 DIAGNOSIS — D509 Iron deficiency anemia, unspecified: Secondary | ICD-10-CM | POA: Insufficient documentation

## 2022-02-02 DIAGNOSIS — E538 Deficiency of other specified B group vitamins: Secondary | ICD-10-CM

## 2022-02-02 DIAGNOSIS — Z9884 Bariatric surgery status: Secondary | ICD-10-CM | POA: Diagnosis not present

## 2022-02-02 DIAGNOSIS — M810 Age-related osteoporosis without current pathological fracture: Secondary | ICD-10-CM | POA: Diagnosis not present

## 2022-02-02 DIAGNOSIS — E610 Copper deficiency: Secondary | ICD-10-CM

## 2022-02-02 DIAGNOSIS — Z79899 Other long term (current) drug therapy: Secondary | ICD-10-CM | POA: Insufficient documentation

## 2022-02-02 DIAGNOSIS — E8809 Other disorders of plasma-protein metabolism, not elsewhere classified: Secondary | ICD-10-CM | POA: Insufficient documentation

## 2022-02-02 DIAGNOSIS — D649 Anemia, unspecified: Secondary | ICD-10-CM

## 2022-02-02 MED ORDER — COPPER CAPS 2 MG PO CAPS: 2.0000 mg | ORAL_CAPSULE | Freq: Every day | ORAL | 1 refills | Status: DC

## 2022-02-02 MED ORDER — FOLIC ACID 400 MCG PO TABS: 400.0000 ug | ORAL_TABLET | Freq: Every day | ORAL | 1 refills | Status: DC

## 2022-02-02 NOTE — Patient Instructions (Signed)
[place] at [facility] Discharge Instructions  You were seen today by Tarri Abernethy PA-C for your anemia.  Your iron levels look great right now!  However, your hemoglobin has dropped slightly and you are moderately anemic.  This is likely related to your chronic disease and inflammation.  Your labs did show borderline low copper and folate levels.  You will need to start taking over-the-counter copper supplement (2 mg) once a day.  You also need to start taking over-the-counter folic acid supplement (400 mcg) once a day.  We will recheck your labs in 3 months.  I will see you for a follow-up visit after labs.     Thank you for choosing [place] at [facility] to provide your oncology and hematology care.  To afford each patient quality time with our provider, please arrive at least 15 minutes before your scheduled appointment time.   If you have a lab appointment with the [facility] please come in thru the Main Entrance and check in at the main information desk.  You need to re-schedule your appointment should you arrive 10 or more minutes late.  We strive to give you quality time with our providers, and arriving late affects you and other patients whose appointments are after yours.  Also, if you no show three or more times for appointments you may be dismissed from the clinic at the providers discretion.     Again, thank you for choosing [facility].  Our hope is that these requests will decrease the amount of time that you wait before being seen by our physicians.       _____________________________________________________________  Should you have questions after your visit to [facility], please contact our office at [phone] and follow the prompts.  Our office hours are 8:00 a.m. and 4:30 p.m. Monday - Friday.  Please note that voicemails left after 4:00 p.m. may not be returned until the following  business day.  We are closed weekends and major holidays.  You do have access to a nurse 24-7, just call the main number to the clinic [phone] and do not press any options, hold on the line and a nurse will answer the phone.    For prescription refill requests, have your pharmacy contact our office and allow 72 hours.    Due to Covid, you will need to wear a mask upon entering the hospital. If you do not have a mask, a mask will be given to you at the Main Entrance upon arrival. For doctor visits, patients may have 1 support person age 51 or older with them. For treatment visits, patients can not have anyone with them due to social distancing guidelines and our immunocompromised population.

## 2022-02-03 ENCOUNTER — Encounter: Payer: Self-pay | Admitting: Gastroenterology

## 2022-02-04 ENCOUNTER — Encounter: Payer: Self-pay | Admitting: Gastroenterology

## 2022-02-04 NOTE — Telephone Encounter (Signed)
Noted message- Physical therapy requests and any other referrals will need to go through PCP just to ensure continuity of care. Also make sure she has a dietician at [facility] or [facility] referral in place as we have none at [employer]

## 2022-02-05 ENCOUNTER — Encounter: Payer: Self-pay | Admitting: Gastroenterology

## 2022-02-10 ENCOUNTER — Ambulatory Visit: Payer: Medicare Other | Admitting: Pulmonary Disease

## 2022-02-10 ENCOUNTER — Ambulatory Visit (INDEPENDENT_AMBULATORY_CARE_PROVIDER_SITE_OTHER): Payer: Medicare Other | Admitting: Student

## 2022-02-10 DIAGNOSIS — R0600 Dyspnea, unspecified: Secondary | ICD-10-CM

## 2022-02-10 NOTE — Progress Notes (Signed)
PFT done today. 

## 2022-02-12 ENCOUNTER — Encounter: Payer: Self-pay | Admitting: Gastroenterology

## 2022-02-17 ENCOUNTER — Other Ambulatory Visit: Payer: Self-pay | Admitting: Gastroenterology

## 2022-02-17 NOTE — Progress Notes (Unsigned)
Synopsis: Referred for dyspnea by Practice, [facility]  Subjective:   PATIENT ID: [name] GENDER: female DOB: December 28, 1950, MRN: [id number]  No chief complaint on file.  70yF with history of lymphedema, diastolic dysfunction and mildly reduced EF, gastric bypass 2003, secondary hyperparathyroidism, GERD, SVT, IDA, covid-19 infection 12/2019 and 02/2021  Underwent right thoracentesis 10/15/21 1L drained. Cyto/cx negative. Transudate. She says she did feel better after thoracentesis.   She says she feels bad. Very short of breath and weak, orthopneic. Some cough with chest congestion but not coughing anything up. No fever. She says she has normal diarrhea - sort of gold looking/yellow.   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   Negative lymphoscintigraphy for venous or lymphatic cause of edema.  PFTs 5/30 normal   Otherwise pertinent review of systems is negative.  Past Medical History:  Diagnosis Date   Asthma    CHF (congestive heart failure) (HCC)    GERD (gastroesophageal reflux disease)    Internal hemorrhoids    Iron deficiency anemia    Osteoporosis    Peptic ulcer    Tubular adenoma of colon    Vertigo      Family History  Problem Relation Age of Onset   Hypertension Other    Stroke Other    Diabetes Other    Heart attack Other    Obesity Other    Diabetes Father    Heart disease Father    Colon cancer Father    Breast cancer Paternal Grandmother    Healthy Daughter    Stomach cancer Neg Hx    Pancreatic cancer Neg Hx      Past Surgical History:  Procedure Laterality Date   BACK SURGERY     BREAST SURGERY     CHOLECYSTECTOMY     COLONOSCOPY WITH PROPOFOL N/A 06/02/2017   Procedure: COLONOSCOPY WITH PROPOFOL;  Surgeon: Jonathon Bellows, MD;  Location: [facility] ENDOSCOPY;  Service: Gastroenterology;  Laterality: N/A;    COLONOSCOPY WITH PROPOFOL N/A 01/28/2021   Procedure: COLONOSCOPY WITH PROPOFOL;  Surgeon: Jonathon Bellows, MD;  Location: [facility] ENDOSCOPY;  Service: Gastroenterology;  Laterality: N/A;   COLONOSCOPY WITH PROPOFOL N/A 12/12/2021   Procedure: COLONOSCOPY WITH PROPOFOL;  Surgeon: Jonathon Bellows, MD;  Location: [facility] ENDOSCOPY;  Service: Gastroenterology;  Laterality: N/A;  Patient is requesting to be last since she lives in [place].   ESOPHAGOGASTRODUODENOSCOPY (EGD) WITH PROPOFOL N/A 07/01/2017   Procedure: ESOPHAGOGASTRODUODENOSCOPY (EGD) WITH PROPOFOL;  Surgeon: Jonathon Bellows, MD;  Location: [facility] ENDOSCOPY;  Service: Gastroenterology;  Laterality: N/A;   ESOPHAGOGASTRODUODENOSCOPY (EGD) WITH PROPOFOL N/A 01/28/2021   Procedure: ESOPHAGOGASTRODUODENOSCOPY (EGD) WITH PROPOFOL;  Surgeon: Jonathon Bellows, MD;  Location: [facility] ENDOSCOPY;  Service: Gastroenterology;  Laterality: N/A;   ESOPHAGOGASTRODUODENOSCOPY (EGD) WITH PROPOFOL N/A 12/12/2021   Procedure: ESOPHAGOGASTRODUODENOSCOPY (EGD) WITH PROPOFOL;  Surgeon: Jonathon Bellows, MD;  Location: [facility] ENDOSCOPY;  Service: Gastroenterology;  Laterality: N/A;   FLEXIBLE SIGMOIDOSCOPY N/A 07/01/2017   Procedure: FLEXIBLE SIGMOIDOSCOPY;  Surgeon: Jonathon Bellows, MD;  Location: [facility] ENDOSCOPY;  Service: Gastroenterology;  Laterality: N/A;   KNEE SURGERY     mini gastric bypass     SHOULDER SURGERY     TONSILLECTOMY      Social History   Socioeconomic History   Marital status: Married    Spouse name: Not on file   Number of children: Not on file   Years of  education: Not on file   Highest education level: Not on file  Occupational History   Not on file  Tobacco Use   Smoking status: Never   Smokeless tobacco: Never  Vaping Use   Vaping Use: Never used  Substance and Sexual Activity   Alcohol use: No    Alcohol/week: 0.0 standard drinks   Drug use: Never   Sexual activity: Never  Other Topics Concern   Not on file  Social History Narrative   Not on file   Social  Determinants of Health   Financial Resource Strain: Not on file  Food Insecurity: Not on file  Transportation Needs: Not on file  Physical Activity: Not on file  Stress: Not on file  Social Connections: Not on file  Intimate Partner Violence: Not on file     Allergies  Allergen Reactions   Zoledronic Acid Other (See Comments)    Other reaction(s): fever and flu-like symptoms   Azithromycin Diarrhea    Other reaction(s): diarrhea   Lac Bovis Diarrhea   Prednisone Diarrhea   Sulfa Antibiotics Itching   Tape Dermatitis     Outpatient Medications Prior to Visit  Medication Sig Dispense Refill   calcitRIOL (ROCALTROL) 0.5 MCG capsule Take 0.5 mcg by mouth daily.  5   Cholecalciferol ([employer] D3) 125 MCG (5000 UT) capsule Take 1 tablet by mouth daily.     COPPER CAPS 2 MG CAPS TAKE 2 MG BY MOUTH DAILY. 90 capsule 1   denosumab (PROLIA) 60 MG/ML SOSY injection Inject 60 mg into the skin every 6 (six) months.     folic acid (V-R FOLIC ACID) 027 MCG tablet Take 1 tablet (400 mcg total) by mouth daily. 90 tablet 1   gabapentin (NEURONTIN) 300 MG capsule Take 2 capsules (600 mg total) by mouth 3 (three) times daily. 180 capsule 3   levothyroxine (SYNTHROID) 25 MCG tablet Take 25 mcg by mouth every morning.     Melatonin 10 MG TABS Take 1 tablet by mouth as needed.     Multiple Vitamin (M.V.I. ADULT IV) Take 1 tablet by mouth daily.     pantoprazole (PROTONIX) 20 MG tablet Take 1 tablet (20 mg total) by mouth daily. 90 tablet 3   Vitamin A 2400 MCG (8000 UT) CAPS Take 1 capsule by mouth daily.     Vitamin E 180 MG (400 UNIT) CAPS Take 1 capsule by mouth daily.     Facility-Administered Medications Prior to Visit  Medication Dose Route Frequency Provider Last Rate Last Admin   albumin human 25 % solution 25 g  25 g Intravenous Once Jonathon Bellows, MD           Objective:   Physical Exam:  General appearance: 71 y.o., female, chronically ill appearing Eyes: PERRL, tracking  appropriately HENT: NCAT; MMM Neck: Trachea midline; no lymphadenopathy, no JVD Lungs: diminished on right, no crackles, no wheeze, with normal respiratory effort CV: tachy, RR, no murmur  Abdomen: Soft, non-tender; non-distended, BS present  Extremities: 2+ BLE edema, warm Skin: Normal turgor and texture; no rash Psych: Appropriate affect Neuro: Alert and oriented to person and place, no focal deficit     There were no vitals filed for this visit.      on RA BMI Readings from Last 3 Encounters:  02/02/22 26.72 kg/m  01/22/22 26.52 kg/m  01/06/22 24.41 kg/m   Wt Readings from Last 3 Encounters:  02/02/22 155 lb 10.3 oz (70.6 kg)  01/22/22 154 lb 8 oz (70.1  kg)  01/06/22 142 lb 3.2 oz (64.5 kg)     CBC    Component Value Date/Time   WBC 5.6 12/17/2021 0859   WBC 5.4 11/10/2021 1512   RBC 4.24 12/17/2021 0859   RBC 4.02 11/10/2021 1512   HGB 12.8 12/17/2021 0859   HCT 41.1 12/17/2021 0859   PLT 229 12/17/2021 0859   MCV 97 12/17/2021 0859   MCH 30.2 12/17/2021 0859   MCH 31.3 11/10/2021 1512   MCHC 31.1 (L) 12/17/2021 0859   MCHC 31.9 11/10/2021 1512   RDW 13.5 12/17/2021 0859   LYMPHSABS 1.1 12/17/2021 0859   MONOABS 0.5 11/10/2021 1512   EOSABS 0.0 12/17/2021 0859   BASOSABS 0.0 12/17/2021 0859   Rheum serologies negative  Chest Imaging:  CXR 12/18/21 no ptx after thora  CXR 12/02/21 slight increase in R effusion, slight decrease in L effusion relative to last week's  CXR 10/20/21 reviewed by me with slight increase in volume of right effusion  CT A/P lung bases 10/17/21 reviewed by me remarkable for  and L effusion, anterior RLL atelectasis more likely than consolidation/pneumonia. Diffuse small bowel wall thickening and mucosal hyperenhancement. Anasarca. Severe hepatic steatosis.        Pulmonary Functions Testing Results:    Latest Ref Rng & Units 02/10/2022    9:52 AM  PFT Results  FVC-Pre L 2.42  P  FVC-Predicted Pre % 82  P  FVC-Post L 2.51   P  FVC-Predicted Post % 85  P  Pre FEV1/FVC % % 70  P  Post FEV1/FCV % % 72  P  FEV1-Pre L 1.70  P  FEV1-Predicted Pre % 76  P  FEV1-Post L 1.82  P  DLCO uncorrected ml/min/mmHg 17.21  P  DLCO UNC% % 90  P  DLCO corrected ml/min/mmHg 17.21  P  DLCO COR %Predicted % 90  P  DLVA Predicted % 90  P  TLC L 5.21  P  TLC % Predicted % 106  P  RV % Predicted % 135  P    P Preliminary result      Echocardiogram:   TTE 07/25/22:   1. Technically difficult study.    2. The left ventricle is upper normal in size with normal wall thickness.    3. The left ventricular systolic function is mildly decreased, LVEF is  visually estimated at 45-50%.    4. There is grade I diastolic dysfunction (impaired relaxation).    5. There is mild mitral valve regurgitation.    6. There is mild aortic regurgitation.    7. The left atrium is moderately dilated in size.    8. The right ventricle is normal in size, with normal systolic function.   Assessment & Plan:   # Dyspnea: # Recurrent right pleural effusion: # Hypoalbuminemia # Chronic diastolic heart failure Transudative. Lymphocytic in past but unclear significance since hypocellular. Probably multifactorial transudate in setting hypoalbuminemia due to ?protein-losing enteropathy (longstanding diarrhea with gold color raises concern for malabsoprtion) and/or impaired protein synthesis in setting severe hepatic steatosis with or without cirrhosis - less likely, diastolic dysfunction.  # history of asthma Seems quiescent. Majority of dyspnea seems to be tied to effusion.  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

## 2022-02-18 ENCOUNTER — Encounter: Payer: Self-pay | Admitting: Student

## 2022-02-18 ENCOUNTER — Ambulatory Visit (INDEPENDENT_AMBULATORY_CARE_PROVIDER_SITE_OTHER): Payer: Medicare Other | Admitting: Student

## 2022-02-18 VITALS — BP 102/64 | HR 85 | Temp 97.8°F | Ht 64.0 in | Wt 158.6 lb

## 2022-02-18 DIAGNOSIS — J90 Pleural effusion, not elsewhere classified: Secondary | ICD-10-CM

## 2022-02-18 DIAGNOSIS — R0600 Dyspnea, unspecified: Secondary | ICD-10-CM | POA: Diagnosis not present

## 2022-02-18 NOTE — Patient Instructions (Signed)
-   Come back and see [place] if you need [place] - increased reliance on albuterol, shortness of breath, trouble breathing when you lie down, cough, fluid

## 2022-02-20 LAB — PULMONARY FUNCTION TEST
DL/VA % pred: 90 %
DL/VA: 3.81 ml/min/mmHg/L
DLCO cor % pred: 90 %
DLCO cor: 17.21 ml/min/mmHg
DLCO unc % pred: 90 %
DLCO unc: 17.21 ml/min/mmHg
FEF 25-75 Post: 1.37 L/sec
FEF 25-75 Pre: 1.07 L/sec
FEF2575-%Change-Post: 27 %
FEF2575-%Pred-Post: 70 %
FEF2575-%Pred-Pre: 55 %
FEV1-%Change-Post: 6 %
FEV1-%Pred-Post: 81 %
FEV1-%Pred-Pre: 76 %
FEV1-Post: 1.82 L
FEV1-Pre: 1.7 L
FEV1FVC-%Change-Post: 2 %
FEV1FVC-%Pred-Pre: 92 %
FEV6-%Change-Post: 4 %
FEV6-%Pred-Post: 89 %
FEV6-%Pred-Pre: 85 %
FEV6-Post: 2.51 L
FEV6-Pre: 2.42 L
FEV6FVC-%Change-Post: 0 %
FEV6FVC-%Pred-Post: 104 %
FEV6FVC-%Pred-Pre: 104 %
FVC-%Change-Post: 3 %
FVC-%Pred-Post: 85 %
FVC-%Pred-Pre: 82 %
FVC-Post: 2.51 L
FVC-Pre: 2.42 L
Post FEV1/FVC ratio: 72 %
Post FEV6/FVC ratio: 100 %
Pre FEV1/FVC ratio: 70 %
Pre FEV6/FVC Ratio: 100 %
RV % pred: 135 %
RV: 2.85 L
TLC % pred: 106 %
TLC: 5.21 L

## 2022-02-23 ENCOUNTER — Ambulatory Visit: Payer: Medicare Other | Admitting: Student

## 2022-02-25 ENCOUNTER — Encounter: Payer: Self-pay | Admitting: Gastroenterology

## 2022-02-25 NOTE — Telephone Encounter (Signed)
Very happy to see she is doing better-labs look better  1. Start the referral for dietician  if possible [place], probably will take few weeks to see one  2. PT via PCP  3. Office visit with me when she returns from beach  4. Advise her to keep [place] posted via mychart if there are any issues.

## 2022-02-26 NOTE — Telephone Encounter (Signed)
Ok thanks 

## 2022-02-27 NOTE — Telephone Encounter (Signed)
Have we referred to [facility] ?

## 2022-03-03 ENCOUNTER — Encounter: Payer: Self-pay | Admitting: Gastroenterology

## 2022-03-24 ENCOUNTER — Telehealth: Payer: Medicare Other | Admitting: Gastroenterology

## 2022-03-24 ENCOUNTER — Encounter: Payer: Self-pay | Admitting: Gastroenterology

## 2022-03-24 NOTE — Progress Notes (Signed)
Labs look good, LFT's mildly elevated- sometimes can be from the TPN- suggest repeat in 2 weeks, any update on her [facility] referral to surgeon

## 2022-03-27 ENCOUNTER — Other Ambulatory Visit: Payer: Self-pay | Admitting: Gastroenterology

## 2022-03-30 ENCOUNTER — Telehealth: Payer: Self-pay

## 2022-03-30 NOTE — Telephone Encounter (Signed)
-----   Message from Jonathon Bellows, MD sent at 03/24/2022  9:53 AM EDT ----- Labs look good, LFT's mildly elevated- sometimes can be from the TPN- suggest repeat in 2 weeks, any update on her [facility] referral to surgeon

## 2022-03-30 NOTE — Telephone Encounter (Signed)
Patient verbalized understanding of results  

## 2022-04-07 ENCOUNTER — Encounter: Payer: Self-pay | Admitting: Gastroenterology

## 2022-04-08 NOTE — Progress Notes (Signed)
Please also C/c PCP

## 2022-04-08 NOTE — Progress Notes (Signed)
Inform  1. Albumin has normalized  2. LFT's elevated likely due to TPN 3. Has she received an appoitnemnt to be seen by surgeon  4.Can we check from the dietician managing her about any suggestions to reducing the feed in view of elevated LFT's 5. Repeat LFT's in 4 weeks, in addition recommend full liver work up for abnormal LFT and RUQ USG

## 2022-04-22 ENCOUNTER — Encounter: Payer: Self-pay | Admitting: Gastroenterology

## 2022-04-23 ENCOUNTER — Encounter: Payer: Self-pay | Admitting: Gastroenterology

## 2022-04-28 ENCOUNTER — Inpatient Hospital Stay: Payer: Medicare Other | Attending: Physician Assistant

## 2022-04-28 DIAGNOSIS — E610 Copper deficiency: Secondary | ICD-10-CM

## 2022-04-28 DIAGNOSIS — E538 Deficiency of other specified B group vitamins: Secondary | ICD-10-CM

## 2022-04-28 DIAGNOSIS — D649 Anemia, unspecified: Secondary | ICD-10-CM

## 2022-04-28 DIAGNOSIS — D509 Iron deficiency anemia, unspecified: Secondary | ICD-10-CM | POA: Diagnosis present

## 2022-04-28 DIAGNOSIS — K909 Intestinal malabsorption, unspecified: Secondary | ICD-10-CM

## 2022-04-28 LAB — COMPREHENSIVE METABOLIC PANEL
ALT: 53 U/L — ABNORMAL HIGH (ref 0–44)
AST: 49 U/L — ABNORMAL HIGH (ref 15–41)
Albumin: 3.2 g/dL — ABNORMAL LOW (ref 3.5–5.0)
Alkaline Phosphatase: 51 U/L (ref 38–126)
Anion gap: 5 (ref 5–15)
BUN: 24 mg/dL — ABNORMAL HIGH (ref 8–23)
CO2: 28 mmol/L (ref 22–32)
Calcium: 8.7 mg/dL — ABNORMAL LOW (ref 8.9–10.3)
Chloride: 108 mmol/L (ref 98–111)
Creatinine, Ser: 0.55 mg/dL (ref 0.44–1.00)
GFR, Estimated: >60 mL/min (ref >60)
Glucose, Bld: 76 mg/dL (ref 70–99)
Potassium: 4 mmol/L (ref 3.5–5.1)
Sodium: 141 mmol/L (ref 135–145)
Total Bilirubin: 0.6 mg/dL (ref 0.3–1.2)
Total Protein: 6.2 g/dL — ABNORMAL LOW (ref 6.5–8.1)

## 2022-04-28 LAB — IRON AND TIBC
Iron: 38 ug/dL (ref 28–170)
Saturation Ratios: 10 % — ABNORMAL LOW (ref 10.4–31.8)
TIBC: 386 ug/dL (ref 250–450)
UIBC: 348 ug/dL

## 2022-04-28 LAB — CBC WITH DIFFERENTIAL/PLATELET
Abs Immature Granulocytes: 0 10*3/uL (ref 0.00–0.07)
Basophils Absolute: 0 10*3/uL (ref 0.0–0.1)
Basophils Relative: 0 %
Eosinophils Absolute: 0.1 10*3/uL (ref 0.0–0.5)
Eosinophils Relative: 3 %
HCT: 35.5 % — ABNORMAL LOW (ref 36.0–46.0)
Hemoglobin: 11.4 g/dL — ABNORMAL LOW (ref 12.0–15.0)
Immature Granulocytes: 0 %
Lymphocytes Relative: 23 %
Lymphs Abs: 1 10*3/uL (ref 0.7–4.0)
MCH: 28.9 pg (ref 26.0–34.0)
MCHC: 32.1 g/dL (ref 30.0–36.0)
MCV: 90.1 fL (ref 80.0–100.0)
Monocytes Absolute: 0.5 10*3/uL (ref 0.1–1.0)
Monocytes Relative: 11 %
Neutro Abs: 2.9 10*3/uL (ref 1.7–7.7)
Neutrophils Relative %: 63 %
Platelets: 172 10*3/uL (ref 150–400)
RBC: 3.94 MIL/uL (ref 3.87–5.11)
RDW: 14.2 % (ref 11.5–15.5)
WBC: 4.5 10*3/uL (ref 4.0–10.5)
nRBC: 0 % (ref 0.0–0.2)

## 2022-04-28 LAB — FERRITIN: Ferritin: 38 ng/mL (ref 11–307)

## 2022-04-28 LAB — FOLATE: Folate: 32.6 ng/mL (ref >5.9)

## 2022-04-28 LAB — VITAMIN B12: Vitamin B-12: 726 pg/mL (ref 180–914)

## 2022-04-28 MED ORDER — HEPARIN SOD (PORK) LOCK FLUSH 100 UNIT/ML IV SOLN
500.0000 [IU] | Freq: Once | INTRAVENOUS | Status: AC
  Administered 2022-04-28: 500 [IU] via INTRAVENOUS

## 2022-04-28 MED ORDER — SODIUM CHLORIDE 0.9% FLUSH
10.0000 mL | Freq: Once | INTRAVENOUS | Status: AC
  Administered 2022-04-28: 10 mL via INTRAVENOUS

## 2022-04-28 NOTE — Progress Notes (Signed)
Phillis Haggis [place] presented for PICC flush and lab draw.  See IV assessment in docflowsheets for PICC details.  Proper placement of PICC confirmed by CXR.  PICC located right arm.  Good blood return present. PICC flushed with 69m NS and 250U Heparin, see MAR for further details.  Olena Ryals JMartiniquetolerated procedure well and without incident.

## 2022-04-28 NOTE — Patient Instructions (Signed)
[facility]  Discharge Instructions: Thank you for choosing [facility] to provide your oncology and hematology care.  If you have a lab appointment with the [facility], please come in thru the Main Entrance and check in at the main information desk.  Wear comfortable clothing and clothing appropriate for easy access to any Portacath or PICC line.   We strive to give you quality time with your provider. You may need to reschedule your appointment if you arrive late (15 or more minutes).  Arriving late affects you and other patients whose appointments are after yours.  Also, if you miss three or more appointments without notifying the office, you may be dismissed from the clinic at the provider's discretion.      For prescription refill requests, have your pharmacy contact our office and allow 72 hours for refills to be completed.    Today you received the following chemotherapy and/or immunotherapy agents blood draw picc flush      To help prevent nausea and vomiting after your treatment, we encourage you to take your nausea medication as directed.  BELOW ARE SYMPTOMS THAT SHOULD BE REPORTED IMMEDIATELY: *FEVER GREATER THAN 100.4 F (38 C) OR HIGHER *CHILLS OR SWEATING *NAUSEA AND VOMITING THAT IS NOT CONTROLLED WITH YOUR NAUSEA MEDICATION *UNUSUAL SHORTNESS OF BREATH *UNUSUAL BRUISING OR BLEEDING *URINARY PROBLEMS (pain or burning when urinating, or frequent urination) *BOWEL PROBLEMS (unusual diarrhea, constipation, pain near the anus) TENDERNESS IN MOUTH AND THROAT WITH OR WITHOUT PRESENCE OF ULCERS (sore throat, sores in mouth, or a toothache) UNUSUAL RASH, SWELLING OR PAIN  UNUSUAL VAGINAL DISCHARGE OR ITCHING   Items with * indicate a potential emergency and should be followed up as soon as possible or go to the Emergency Department if any problems should occur.  Please show the CHEMOTHERAPY ALERT CARD or IMMUNOTHERAPY ALERT CARD at check-in to  the Emergency Department and triage nurse.  Should you have questions after your visit or need to cancel or reschedule your appointment, please contact [facility] [phone]  and follow the prompts.  Office hours are 8:00 a.m. to 4:30 p.m. Monday - Friday. Please note that voicemails left after 4:00 p.m. may not be returned until the following business day.  We are closed weekends and major holidays. You have access to a nurse at all times for urgent questions. Please call the main number to the clinic [phone] and follow the prompts.  For any non-urgent questions, you may also contact your provider using MyChart. We now offer e-Visits for anyone 26 and older to request care online for non-urgent symptoms. For details visit [email or web address].   Also download the MyChart app! Go to the app store, search "MyChart", open the app, select [facility], and log in with your MyChart username and password.  Masks are optional in the cancer centers. If you would like for your care team to wear a mask while they are taking care of you, please let them know. For doctor visits, patients may have with them one support person who is at least 69 years old. At this time, visitors are not allowed in the infusion area.

## 2022-04-29 LAB — KAPPA/LAMBDA LIGHT CHAINS
Kappa free light chain: 39.2 mg/L — ABNORMAL HIGH (ref 3.3–19.4)
Kappa, lambda light chain ratio: 1.28 (ref 0.26–1.65)
Lambda free light chains: 30.7 mg/L — ABNORMAL HIGH (ref 5.7–26.3)

## 2022-04-29 LAB — HOMOCYSTEINE: Homocysteine: 11.9 umol/L (ref 0.0–17.2)

## 2022-05-01 LAB — PROTEIN ELECTROPHORESIS, SERUM
A/G Ratio: 1.2 (ref 0.7–1.7)
Albumin ELP: 3.2 g/dL (ref 2.9–4.4)
Alpha-1-Globulin: 0.3 g/dL (ref 0.0–0.4)
Alpha-2-Globulin: 0.7 g/dL (ref 0.4–1.0)
Beta Globulin: 0.9 g/dL (ref 0.7–1.3)
Gamma Globulin: 0.7 g/dL (ref 0.4–1.8)
Globulin, Total: 2.6 g/dL (ref 2.2–3.9)
Total Protein ELP: 5.8 g/dL — ABNORMAL LOW (ref 6.0–8.5)

## 2022-05-01 LAB — METHYLMALONIC ACID, SERUM: Methylmalonic Acid, Quantitative: 146 nmol/L (ref 0–378)

## 2022-05-03 LAB — COPPER, SERUM: Copper: 135 ug/dL (ref 80–158)

## 2022-05-04 LAB — IMMUNOFIXATION ELECTROPHORESIS
IgA: 209 mg/dL (ref 87–352)
IgG (Immunoglobin G), Serum: 634 mg/dL (ref 586–1602)
IgM (Immunoglobulin M), Srm: 168 mg/dL (ref 26–217)
Total Protein ELP: 5.7 g/dL — ABNORMAL LOW (ref 6.0–8.5)

## 2022-05-04 NOTE — Progress Notes (Unsigned)
[name], [place] [address]   CLINIC:  Medical Oncology/Hematology  PCP:  Janine Limbo, PA-C [place] [address] [phone]   REASON FOR VISIT:  Follow-up for iron deficiency anemia  PRIOR THERAPY: None  CURRENT THERAPY: Intermittent IV iron (last Feraheme on 09/19/2019)  INTERVAL HISTORY:  [name] 71 y.o. female returns for routine follow-up of her iron deficiency anemia.  She was last seen by Tarri Abernethy PA-C on 02/02/2022.  At today's visit, she reports feeling fair.  She has not had any hospitalizations or changes in her baseline health status since her last visit.  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  She denies any signs of blood loss such as bright red blood per rectum, melena, epistaxis, or hematuria.  She has some fatigue with energy about 70%, improved but not yet at baseline following hospitalization in March 2023.  She has episodes of lightheadedness, which she thinks may be from her gabapentin.  She denies any pica, restless legs, headaches, chest pain, dyspnea on exertion, or syncope.  She stopped taking folic acid and copper June 2023.   She has 70% energy and 80% appetite. She has had weight fluctuations secondary to fluid overload.     REVIEW OF SYSTEMS:    Review of Systems  Constitutional:  Positive for fatigue. Negative for appetite change, chills, diaphoresis, fever and unexpected weight change.  HENT:   Negative for lump/mass and nosebleeds.   Eyes:  Negative for eye problems.  Respiratory:  Negative for cough, hemoptysis and shortness of breath.   Cardiovascular:  Negative for chest pain, leg swelling and palpitations.  Gastrointestinal:  Positive for nausea. Negative for abdominal pain, blood  in stool, constipation, diarrhea and vomiting.  Genitourinary:  Negative for hematuria.   Musculoskeletal:  Positive for arthralgias.  Skin: Negative.   Neurological:  Positive for dizziness, light-headedness and numbness. Negative for headaches.  Hematological:  Does not bruise/bleed easily.      PAST MEDICAL/SURGICAL HISTORY:  Past Medical History:  Diagnosis Date   Asthma    CHF (congestive heart failure) (HCC)    GERD (gastroesophageal reflux disease)    Internal hemorrhoids    Iron deficiency anemia    Osteoporosis    Peptic ulcer    Tubular adenoma of colon    Vertigo    Past Surgical History:  Procedure Laterality Date   BACK SURGERY     BREAST SURGERY     CHOLECYSTECTOMY     COLONOSCOPY WITH PROPOFOL N/A 06/02/2017   Procedure: COLONOSCOPY WITH PROPOFOL;  Surgeon: Jonathon Bellows, MD;  Location: [facility] ENDOSCOPY;  Service: Gastroenterology;  Laterality: N/A;   COLONOSCOPY WITH PROPOFOL N/A 01/28/2021   Procedure: COLONOSCOPY WITH PROPOFOL;  Surgeon: Jonathon Bellows, MD;  Location: [facility] ENDOSCOPY;  Service: Gastroenterology;  Laterality: N/A;   COLONOSCOPY WITH PROPOFOL N/A 12/12/2021   Procedure: COLONOSCOPY WITH PROPOFOL;  Surgeon: Jonathon Bellows, MD;  Location: [facility] ENDOSCOPY;  Service: Gastroenterology;  Laterality: N/A;  Patient is requesting to be last since she lives in [place].   ESOPHAGOGASTRODUODENOSCOPY (EGD) WITH PROPOFOL N/A 07/01/2017   Procedure: ESOPHAGOGASTRODUODENOSCOPY (EGD) WITH PROPOFOL;  Surgeon: Jonathon Bellows, MD;  Location: [facility] ENDOSCOPY;  Service: Gastroenterology;  Laterality: N/A;   ESOPHAGOGASTRODUODENOSCOPY (EGD) WITH PROPOFOL N/A 01/28/2021   Procedure: ESOPHAGOGASTRODUODENOSCOPY (EGD) WITH  PROPOFOL;  Surgeon: Jonathon Bellows, MD;  Location: [facility] ENDOSCOPY;  Service: Gastroenterology;  Laterality: N/A;   ESOPHAGOGASTRODUODENOSCOPY (EGD) WITH PROPOFOL N/A 12/12/2021   Procedure: ESOPHAGOGASTRODUODENOSCOPY (EGD) WITH PROPOFOL;  Surgeon: Jonathon Bellows, MD;  Location: [facility]  ENDOSCOPY;  Service: Gastroenterology;  Laterality: N/A;   FLEXIBLE SIGMOIDOSCOPY N/A 07/01/2017   Procedure: FLEXIBLE SIGMOIDOSCOPY;  Surgeon: Jonathon Bellows, MD;  Location: [facility] ENDOSCOPY;  Service: Gastroenterology;  Laterality: N/A;   KNEE SURGERY     mini gastric bypass     SHOULDER SURGERY     TONSILLECTOMY       SOCIAL HISTORY:  Social History   Socioeconomic History   Marital status: Married    Spouse name: Not on file   Number of children: Not on file   Years of education: Not on file   Highest education level: Not on file  Occupational History   Not on file  Tobacco Use   Smoking status: Never   Smokeless tobacco: Never  Vaping Use   Vaping Use: Never used  Substance and Sexual Activity   Alcohol use: No    Alcohol/week: 0.0 standard drinks of alcohol   Drug use: Never   Sexual activity: Never  Other Topics Concern   Not on file  Social History Narrative   Not on file   Social Determinants of Health   Financial Resource Strain: Not on file  Food Insecurity: Not on file  Transportation Needs: Not on file  Physical Activity: Not on file  Stress: Not on file  Social Connections: Not on file  Intimate Partner Violence: Not on file    FAMILY HISTORY:  Family History  Problem Relation Age of Onset   Hypertension Other    Stroke Other    Diabetes Other    Heart attack Other    Obesity Other    Diabetes Father    Heart disease Father    Colon cancer Father    Breast cancer Paternal Grandmother    Healthy Daughter    Stomach cancer Neg Hx    Pancreatic cancer Neg Hx     CURRENT MEDICATIONS:  Outpatient Encounter Medications as of 05/05/2022  Medication Sig   calcitRIOL (ROCALTROL) 0.5 MCG capsule Take 0.5 mcg by mouth daily.   Cholecalciferol ([employer]S D3) 125 MCG (5000 UT) capsule Take 1 tablet by mouth daily.   COPPER CAPS 2 MG CAPS TAKE 2 MG BY MOUTH DAILY.   denosumab (PROLIA) 60 MG/ML SOSY injection Inject 60 mg into the skin every 6 (six) months.    folic acid (V-R FOLIC ACID) 323 MCG tablet Take 1 tablet (400 mcg total) by mouth daily.   gabapentin (NEURONTIN) 300 MG capsule Take 2 capsules (600 mg total) by mouth 3 (three) times daily.   levothyroxine (SYNTHROID) 25 MCG tablet Take 25 mcg by mouth every morning.   Melatonin 10 MG TABS Take 1 tablet by mouth as needed.   Multiple Vitamin (M.V.I. ADULT IV) Take 1 tablet by mouth daily.   pantoprazole (PROTONIX) 20 MG tablet TAKE 1 TABLET BY MOUTH EVERY DAY   Vitamin A 2400 MCG (8000 UT) CAPS Take 1 capsule by mouth daily.   Vitamin E 180 MG (400 UNIT) CAPS Take 1 capsule by mouth daily.   Facility-Administered Encounter Medications as of 05/05/2022  Medication   albumin human 25 % solution 25 g    ALLERGIES:  Allergies  Allergen Reactions   Zoledronic Acid Other (See Comments)    Other reaction(s): fever and flu-like symptoms  Azithromycin Diarrhea    Other reaction(s): diarrhea   Milk (Cow) Diarrhea   Prednisone Diarrhea   Sulfa Antibiotics Itching   Tape Dermatitis     PHYSICAL EXAM:    ECOG PERFORMANCE STATUS: 2 - Symptomatic, <50% confined to bed  There were no vitals filed for this visit.  There were no vitals filed for this visit.  Physical Exam Constitutional:      Appearance: Normal appearance.  HENT:     Head: Normocephalic and atraumatic.     Mouth/Throat:     Mouth: Mucous membranes are moist.  Eyes:     Extraocular Movements: Extraocular movements intact.     Pupils: Pupils are equal, round, and reactive to light.  Cardiovascular:     Rate and Rhythm: Normal rate and regular rhythm.     Pulses: Normal pulses.     Heart sounds: Normal heart sounds.  Pulmonary:     Effort: Pulmonary effort is normal.     Breath sounds: Normal breath sounds.  Abdominal:     General: Bowel sounds are normal.     Palpations: Abdomen is soft.     Tenderness: There is no abdominal tenderness.  Musculoskeletal:        General: No swelling.     Right lower leg: Edema  present.     Left lower leg: Edema present.     Comments: Chronic lymphedema  Lymphadenopathy:     Cervical: No cervical adenopathy.  Skin:    General: Skin is warm and dry.  Neurological:     General: No focal deficit present.     Mental Status: She is alert and oriented to person, place, and time.  Psychiatric:        Mood and Affect: Mood normal.        Behavior: Behavior normal.     LABORATORY DATA:  I have reviewed the labs as listed.  CBC    Component Value Date/Time   WBC 4.5 04/28/2022 1221   RBC 3.94 04/28/2022 1221   HGB 11.4 (L) 04/28/2022 1221   HGB 12.8 12/17/2021 0859   HCT 35.5 (L) 04/28/2022 1221   HCT 41.1 12/17/2021 0859   PLT 172 04/28/2022 1221   PLT 229 12/17/2021 0859   MCV 90.1 04/28/2022 1221   MCV 97 12/17/2021 0859   MCH 28.9 04/28/2022 1221   MCHC 32.1 04/28/2022 1221   RDW 14.2 04/28/2022 1221   RDW 13.5 12/17/2021 0859   LYMPHSABS 1.0 04/28/2022 1221   LYMPHSABS 1.1 12/17/2021 0859   MONOABS 0.5 04/28/2022 1221   EOSABS 0.1 04/28/2022 1221   EOSABS 0.0 12/17/2021 0859   BASOSABS 0.0 04/28/2022 1221   BASOSABS 0.0 12/17/2021 0859      Latest Ref Rng & Units 04/28/2022   12:21 PM 12/17/2021    8:59 AM 11/10/2021    3:12 PM  CMP  Glucose 70 - 99 mg/dL 76  83  92   BUN 8 - 23 mg/dL '24  6  11   '$ Creatinine 0.44 - 1.00 mg/dL 0.55  0.55  0.64   Sodium 135 - 145 mmol/L 141  139  141   Potassium 3.5 - 5.1 mmol/L 4.0  4.1  4.4   Chloride 98 - 111 mmol/L 108  104  109   CO2 22 - 32 mmol/L '28  23  24   '$ Calcium 8.9 - 10.3 mg/dL 8.7  6.7  7.3   Total Protein 6.5 - 8.1 g/dL 6.2  4.2  4.7  Total Bilirubin 0.3 - 1.2 mg/dL 0.6  0.5  1.0   Alkaline Phos 38 - 126 U/L 51  113  86   AST 15 - 41 U/L 49  45  57   ALT 0 - 44 U/L 53  22  35     DIAGNOSTIC IMAGING:  I have independently reviewed the relevant imaging and discussed with the patient.  ASSESSMENT & PLAN: 1.  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  Roux-en-Y gastrojejunostomy with gastrojejunal anastomosis characterized by congestion, edema, and erythema.  Normal colon. - She receives intermittent IV Feraheme, most recently on 09/29/2019.  She reports that her energy improves after IV iron. - She denies any epistaxis, hematemesis, hematochezia, melena    - She has fatigue. - She has not been taking her copper or folic acid since June 6283, but is now receiving TPN. - Most recent labs (04/28/2022): Normal B12 and MMA. Iron deficiency noted with ferritin 38, iron saturation 10%. Copper has normalized.  Folate improved, homocystine normal. Mildly elevated kappa and lambda free light chains with normal ratio 1.28.  SPEP negative for M spike.  Immunofixation unremarkable. - Most recent CBC (04/28/2022): Hgb 11.4/MCV 90.1, otherwise normal CBC - PLAN: Recommend IV Feraheme x2 - No indication to restart copper folic acid supplements at this time.  We will recheck levels at follow-up. - Repeat labs with follow-up visit in 4 months   All questions were answered. The patient knows to call the clinic with any problems, questions or concerns.  Medical decision making: Low    Time spent on visit: I spent 15 minutes counseling the patient face to face. The total time spent in the appointment was 22 minutes and more than 50% was on counseling.   Harriett Rush, PA-C  05/05/2022 10:44 AM

## 2022-05-05 ENCOUNTER — Inpatient Hospital Stay (HOSPITAL_BASED_OUTPATIENT_CLINIC_OR_DEPARTMENT_OTHER): Payer: Medicare Other | Admitting: Physician Assistant

## 2022-05-05 VITALS — BP 114/68 | HR 80 | Temp 97.0°F | Resp 18 | Ht 64.0 in | Wt 173.1 lb

## 2022-05-05 DIAGNOSIS — D509 Iron deficiency anemia, unspecified: Secondary | ICD-10-CM | POA: Diagnosis not present

## 2022-05-05 DIAGNOSIS — K909 Intestinal malabsorption, unspecified: Secondary | ICD-10-CM

## 2022-05-05 DIAGNOSIS — D500 Iron deficiency anemia secondary to blood loss (chronic): Secondary | ICD-10-CM

## 2022-05-05 DIAGNOSIS — E610 Copper deficiency: Secondary | ICD-10-CM

## 2022-05-05 DIAGNOSIS — D649 Anemia, unspecified: Secondary | ICD-10-CM

## 2022-05-05 DIAGNOSIS — E538 Deficiency of other specified B group vitamins: Secondary | ICD-10-CM

## 2022-05-05 NOTE — Patient Instructions (Signed)
[place] at [facility] Discharge Instructions  You were seen today by Tarri Abernethy PA-C for your anemia.  Your blood and iron levels are slightly low.  We will schedule you for IV iron x2 doses.  Your copper, B12, and folate levels are normal.  No additional supplements needed at this time.  We will recheck these labs in 4 months.  ** Thank you for trusting me with your healthcare!  I strive to provide all of my patients with quality care at each visit.  If you receive a survey for this visit, I would be so grateful to you for taking the time to provide feedback.  Thank you in advance!  ~ Valerya Maxton                   Dr. Derek Jack   &   Tarri Abernethy, PA-C   - - - - - - - - - - - - - - - - - -      Thank you for choosing [place] at [facility] to provide your oncology and hematology care.  To afford each patient quality time with our provider, please arrive at least 15 minutes before your scheduled appointment time.   If you have a lab appointment with the [facility] please come in thru the Main Entrance and check in at the main information desk.  You need to re-schedule your appointment should you arrive 10 or more minutes late.  We strive to give you quality time with our providers, and arriving late affects you and other patients whose appointments are after yours.  Also, if you no show three or more times for appointments you may be dismissed from the clinic at the providers discretion.     Again, thank you for choosing [facility].  Our hope is that these requests will decrease the amount of time that you wait before being seen by our physicians.       _____________________________________________________________  Should you have questions after your visit to [facility], please contact our office at [phone] and follow the prompts.  Our office hours are 8:00 a.m. and 4:30 p.m.  Monday - Friday.  Please note that voicemails left after 4:00 p.m. may not be returned until the following business day.  We are closed weekends and major holidays.  You do have access to a nurse 24-7, just call the main number to the clinic [phone] and do not press any options, hold on the line and a nurse will answer the phone.    For prescription refill requests, have your pharmacy contact our office and allow 72 hours.    Due to Covid, you will need to wear a mask upon entering the hospital. If you do not have a mask, a mask will be given to you at the Main Entrance upon arrival. For doctor visits, patients may have 1 support person age 65 or older with them. For treatment visits, patients can not have anyone with them due to social distancing guidelines and our immunocompromised population.

## 2022-05-06 DIAGNOSIS — R7989 Other specified abnormal findings of blood chemistry: Secondary | ICD-10-CM

## 2022-05-06 DIAGNOSIS — Z789 Other specified health status: Secondary | ICD-10-CM

## 2022-05-11 ENCOUNTER — Ambulatory Visit (INDEPENDENT_AMBULATORY_CARE_PROVIDER_SITE_OTHER): Payer: Medicare Other | Admitting: Gastroenterology

## 2022-05-11 ENCOUNTER — Encounter: Payer: Self-pay | Admitting: Gastroenterology

## 2022-05-11 VITALS — BP 102/70 | HR 68 | Temp 97.8°F | Wt 174.2 lb

## 2022-05-11 DIAGNOSIS — Z789 Other specified health status: Secondary | ICD-10-CM | POA: Diagnosis not present

## 2022-05-11 DIAGNOSIS — R7989 Other specified abnormal findings of blood chemistry: Secondary | ICD-10-CM | POA: Diagnosis not present

## 2022-05-11 NOTE — Progress Notes (Signed)
Jonathon Bellows MD, MRCP(U.K) [address]  [place]  [place], [place] [address]  Main: [phone]  Fax: [phone]   Primary Care Physician: Janine Limbo, PA-C  Primary Gastroenterologist:  Dr. Jonathon Bellows   Chief Complaint  Patient presents with   TPN    HPI: [name] is a 71 y.o. female  Summary of history :   Initially referred and seen on 01/08/2021 for vomiting after eating.  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 2019 she was seen by [facility] and underwent hemorrhoidal banding.  .Does complain of heartburn despite being on famotidine.  Previously was on pantoprazole.  Seen by the cancer center for iron deficiency anemia in March 2022 hemoglobin 13.1 g has received IV iron.   01/08/2021: Tested positive H. pylori.  Treated with quadruple based bismuth regimen. 02/04/2021: Hemoglobin 13.5 g, ferritin 213, B12 normal, folate normal, CMP normal 01/28/2021: EGD: Features of gastric bypass seen otherwise procedure was normal.  Colonoscopy also performed on the same day.  2 sessile polyps 10 to 12 mm were resected colon there were tubular adenomas repeat colonoscopy in 3 years  03/19/2021: H. pylori breath test negative  10/03/2021: Ultrasound abdomen showed small amount of ascites bilateral pleural effusions.  08/06/2021 echo shows normal study normal LV function  12/12/2021: EGD and colonoscopy performed with biopsies of the stomach and duodenum jejunum: Taken.  No evidence of duodenitis or celiac disease.  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  Terminal ileum appeared normal no evidence of microscopic colitis . No evidence of amyloidosis]  I referred her to [facility] for second opinion to help figure out the cause for the low albumin and they also agreed with my assessment that her liver disease was not the cause of her hypoalbuminemia and effusions  suggested endoscopy and colonoscopy to rule out any protein-losing enteropathy which we have performed as above.  The process of illumination the only obvious cause for the low albumin was the gastric bypass Commenced on TPN on 12/23/2021.    Interval history 01/26/2022-05/11/2022   On IV iron, albumin 3.2 in 04/2022 significantly improved.  Doing very well gained weight feels better walking keeping active.     Current Outpatient Medications  Medication Sig Dispense Refill   albuterol (VENTOLIN HFA) 108 (90 Base) MCG/ACT inhaler      calcitRIOL (ROCALTROL) 0.5 MCG capsule Take 0.5 mcg by mouth daily.  5   Cholecalciferol (CVS D3) 125 MCG (5000 UT) capsule Take 1 tablet by mouth daily.     denosumab (PROLIA) 60 MG/ML SOSY injection Inject 60 mg into the skin every 6 (six) months.     gabapentin (NEURONTIN) 300 MG capsule Take 2 capsules (600 mg total) by mouth 3 (three) times daily. 180 capsule 3   levothyroxine (SYNTHROID) 25 MCG tablet Take 25 mcg by mouth every morning.     Melatonin 10 MG TABS Take 1 tablet by mouth as needed.     Multiple Vitamin (M.V.I. ADULT IV) Take 1 tablet by mouth daily.     pantoprazole (PROTONIX) 20 MG tablet TAKE 1 TABLET BY MOUTH EVERY DAY 90 tablet 3   Vitamin A 2400 MCG (8000 UT) CAPS Take 1 capsule by mouth daily.     Vitamin E 180 MG (400 UNIT) CAPS Take 1 capsule by mouth daily.     Current Facility-Administered Medications  Medication Dose Route Frequency Provider Last Rate Last Admin  albumin human 25 % solution 25 g  25 g Intravenous Once Jonathon Bellows, MD        Allergies as of 05/11/2022 - Review Complete 05/11/2022  Allergen Reaction Noted   Zoledronic acid Other (See Comments) 09/02/2021   Azithromycin Diarrhea 08/15/2014   Milk (cow) Diarrhea 08/15/2014   Prednisone Diarrhea 08/15/2014   Sulfa antibiotics Itching 08/15/2014   Tape Dermatitis 02/24/2018    ROS:  General: Negative for anorexia, weight loss, fever, chills, fatigue,  weakness. ENT: Negative for hoarseness, difficulty swallowing , nasal congestion. CV: Negative for chest pain, angina, palpitations, dyspnea on exertion, peripheral edema.  Respiratory: Negative for dyspnea at rest, dyspnea on exertion, cough, sputum, wheezing.  GI: See history of present illness. GU:  Negative for dysuria, hematuria, urinary incontinence, urinary frequency, nocturnal urination.  Endo: Negative for unusual weight change.    Physical Examination:   Wt 174 lb 3.2 oz (79 kg)   BMI 29.90 kg/m   General: Well-nourished, well-developed in no acute distress.  Eyes: No icterus. Conjunctivae pink. Neuro: Alert and oriented x 3.  Grossly intact. Skin: Warm and dry, no jaundice.   Psych: Alert and cooperative, normal mood and affect.   Imaging Studies: No results found.  Assessment and Plan:   [name] is a 71 y.o. y/o female who is here to follow-up for long-term TPN.  She presented initially with unintentional weight loss and a very low serum albumin.  She had a full GI work-up and it was determined that it was likely related to her Roux-en-Y gastric bypass which is making her lose a lot of her nutrition.  Has responded really well to TPN.  Her abdomen has almost normalized.  Recently noted to have mild elevation of her LFTs.  Discussed with her the plan we     Plan 1.  Abnormal LFTs obtain full autoimmune and viral hepatitis work-up right upper quadrant ultrasound.  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  At this point of time it is less than 1.5 upper limit of normal of the transaminases which would be classified as mild. will inform her dietitian to see if they can change the feed to help with the TPN hepatopathy, obtain labs, right upper quadrant ultrasound, gradually decrease the quantity of feed and hopefully in the next few months we can increase oral intake and stop the TPN.  We will monitor closely in terms of her weight and albumin  during the process     Dr Jonathon Bellows  MD,MRCP [facility]) Follow up in 3 months

## 2022-05-12 ENCOUNTER — Ambulatory Visit: Payer: Medicare Other | Attending: Cardiology | Admitting: Cardiology

## 2022-05-12 ENCOUNTER — Encounter: Payer: Self-pay | Admitting: Cardiology

## 2022-05-12 ENCOUNTER — Inpatient Hospital Stay: Payer: Medicare Other

## 2022-05-12 VITALS — BP 109/66 | HR 71 | Temp 98.1°F | Resp 18

## 2022-05-12 VITALS — BP 110/64 | HR 68 | Ht 64.0 in | Wt 176.2 lb

## 2022-05-12 DIAGNOSIS — R600 Localized edema: Secondary | ICD-10-CM

## 2022-05-12 DIAGNOSIS — D500 Iron deficiency anemia secondary to blood loss (chronic): Secondary | ICD-10-CM

## 2022-05-12 DIAGNOSIS — D509 Iron deficiency anemia, unspecified: Secondary | ICD-10-CM | POA: Diagnosis not present

## 2022-05-12 DIAGNOSIS — R002 Palpitations: Secondary | ICD-10-CM

## 2022-05-12 DIAGNOSIS — K922 Gastrointestinal hemorrhage, unspecified: Secondary | ICD-10-CM

## 2022-05-12 MED ORDER — SODIUM CHLORIDE 0.9 % IV SOLN: Freq: Once | INTRAVENOUS | Status: AC

## 2022-05-12 MED ORDER — HEPARIN SOD (PORK) LOCK FLUSH 100 UNIT/ML IV SOLN
500.0000 [IU] | Freq: Once | INTRAVENOUS | Status: AC
  Administered 2022-05-12: 500 [IU] via INTRAVENOUS

## 2022-05-12 MED ORDER — SODIUM CHLORIDE 0.9% FLUSH
10.0000 mL | INTRAVENOUS | Status: DC | PRN
  Administered 2022-05-12: 10 mL

## 2022-05-12 MED ORDER — SODIUM CHLORIDE 0.9 % IV SOLN
510.0000 mg | Freq: Once | INTRAVENOUS | Status: AC
  Administered 2022-05-12: 510 mg via INTRAVENOUS
  Filled 2022-05-12: qty 510

## 2022-05-12 MED ORDER — LORATADINE 10 MG PO TABS
10.0000 mg | ORAL_TABLET | Freq: Once | ORAL | Status: AC
  Administered 2022-05-12: 10 mg via ORAL
  Filled 2022-05-12: qty 1

## 2022-05-12 MED ORDER — ACETAMINOPHEN 325 MG PO TABS
650.0000 mg | ORAL_TABLET | Freq: Once | ORAL | Status: AC
  Administered 2022-05-12: 650 mg via ORAL
  Filled 2022-05-12: qty 2

## 2022-05-12 NOTE — Patient Instructions (Signed)

## 2022-05-12 NOTE — Progress Notes (Signed)
Pt presents today for Feraheme IV iron per provider's order. Vital signs and pt voiced no new complaints at this time.   Feraheme  given today per MD orders. Tolerated infusion without adverse affects. Vital signs stable. No complaints at this time. Discharged from clinic ambulatory in stable condition. Alert and oriented x 3. F/U with [facility] as scheduled. Pt did not wait the 30 minute wait time per policy due to having another doctor's appointment in [place].

## 2022-05-12 NOTE — Progress Notes (Signed)
Clinical Summary [name] is a 71 y.o.female seen today for follow up of the following medical problems.    1.Palpitations - prior monitors have shown PACs and atrial runs -08/2021 monitor occasional PACs, frequent runs of SVT up to 29 seconds. Two runs of NSVT longest 8 beats. Symptoms correalted with PACs and SVT.     - rare palpitations. Infrequent, longest lasting up to 2-3 minutes.     2. Chest pain - 07/2021 nuclear stress: no ischemia - chest pains have resolved   ER visit 12/08/21 with negative workup for ACS -- no recent chest pains     3. SOB/Edema - ongoign eval by pumlonary - benign stress test - [facility] echo low normal to mildly decreased LVEF 49-70%, grade I diastolic dysfunction. She is on oral lasix. Volme status difficult to assess due to body habitus.  - could be related to her PACs and PSVT   -orthostatic symptoms and fall on higher diuretic dosing, see 11/26/21 phone call  11/2021 pro BNP  --->Pro BNP 1025 --> 383 - - issues with hypothyroidism - albumin 2.2, from notes thought that liver disease is not cause. 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 Weight has stabilized at 176   5. Pleural effusion - noted 08/2021 xray by pulmonary    6. Low bp's - did not start midodrine, bp's have improved as fluid status leveled off  Past Medical History:  Diagnosis Date   Asthma    CHF (congestive heart failure) (HCC)    GERD (gastroesophageal reflux disease)    Internal hemorrhoids    Iron deficiency anemia    Osteoporosis    Peptic ulcer    Tubular adenoma of colon    Vertigo      Allergies  Allergen Reactions   Zoledronic Acid Other (See Comments)    Other reaction(s): fever and flu-like symptoms   Azithromycin Diarrhea    Other reaction(s): diarrhea   Milk (Cow) Diarrhea   Prednisone Diarrhea   Sulfa Antibiotics Itching   Tape Dermatitis      Current Outpatient Medications  Medication Sig Dispense Refill   albuterol (VENTOLIN HFA) 108 (90 Base) MCG/ACT inhaler      calcitRIOL (ROCALTROL) 0.5 MCG capsule Take 0.5 mcg by mouth daily.  5   Cholecalciferol ([employer] D3) 125 MCG (5000 UT) capsule Take 1 tablet by mouth daily.     denosumab (PROLIA) 60 MG/ML SOSY injection Inject 60 mg into the skin every 6 (six) months.     gabapentin (NEURONTIN) 300 MG capsule Take 2 capsules (600 mg total) by mouth 3 (three) times daily. 180 capsule 3   levothyroxine (SYNTHROID) 25 MCG tablet Take 25 mcg by mouth every morning.     Melatonin 10 MG TABS Take 1 tablet by mouth as needed.     Multiple Vitamin (M.V.I. ADULT IV) Take 1 tablet by mouth daily.     pantoprazole (PROTONIX) 20 MG tablet TAKE 1 TABLET BY MOUTH EVERY DAY 90 tablet 3   Vitamin A 2400 MCG (8000 UT) CAPS Take 1 capsule by mouth daily.     Vitamin E 180 MG (400 UNIT) CAPS Take 1 capsule by mouth daily.     Current Facility-Administered Medications  Medication Dose Route Frequency Provider Last Rate Last Admin   albumin human 25 % solution 25 g  25 g Intravenous Once Vicente Males,  Bailey Mech, MD         Past Surgical History:  Procedure Laterality Date   BACK SURGERY     BREAST SURGERY     CHOLECYSTECTOMY     COLONOSCOPY WITH PROPOFOL N/A 06/02/2017   Procedure: COLONOSCOPY WITH PROPOFOL;  Surgeon: Jonathon Bellows, MD;  Location: [facility] ENDOSCOPY;  Service: Gastroenterology;  Laterality: N/A;   COLONOSCOPY WITH PROPOFOL N/A 01/28/2021   Procedure: COLONOSCOPY WITH PROPOFOL;  Surgeon: Jonathon Bellows, MD;  Location: [facility] ENDOSCOPY;  Service: Gastroenterology;  Laterality: N/A;   COLONOSCOPY WITH PROPOFOL N/A 12/12/2021   Procedure: COLONOSCOPY WITH PROPOFOL;  Surgeon: Jonathon Bellows, MD;  Location: [facility] ENDOSCOPY;  Service: Gastroenterology;  Laterality: N/A;  Patient is requesting to be last since she lives in [place].   ESOPHAGOGASTRODUODENOSCOPY (EGD) WITH PROPOFOL N/A 07/01/2017   Procedure:  ESOPHAGOGASTRODUODENOSCOPY (EGD) WITH PROPOFOL;  Surgeon: Jonathon Bellows, MD;  Location: [facility] ENDOSCOPY;  Service: Gastroenterology;  Laterality: N/A;   ESOPHAGOGASTRODUODENOSCOPY (EGD) WITH PROPOFOL N/A 01/28/2021   Procedure: ESOPHAGOGASTRODUODENOSCOPY (EGD) WITH PROPOFOL;  Surgeon: Jonathon Bellows, MD;  Location: [facility] ENDOSCOPY;  Service: Gastroenterology;  Laterality: N/A;   ESOPHAGOGASTRODUODENOSCOPY (EGD) WITH PROPOFOL N/A 12/12/2021   Procedure: ESOPHAGOGASTRODUODENOSCOPY (EGD) WITH PROPOFOL;  Surgeon: Jonathon Bellows, MD;  Location: [facility] ENDOSCOPY;  Service: Gastroenterology;  Laterality: N/A;   FLEXIBLE SIGMOIDOSCOPY N/A 07/01/2017   Procedure: FLEXIBLE SIGMOIDOSCOPY;  Surgeon: Jonathon Bellows, MD;  Location: [facility] ENDOSCOPY;  Service: Gastroenterology;  Laterality: N/A;   KNEE SURGERY     mini gastric bypass     SHOULDER SURGERY     TONSILLECTOMY       Allergies  Allergen Reactions   Zoledronic Acid Other (See Comments)    Other reaction(s): fever and flu-like symptoms   Azithromycin Diarrhea    Other reaction(s): diarrhea   Milk (Cow) Diarrhea   Prednisone Diarrhea   Sulfa Antibiotics Itching   Tape Dermatitis      Family History  Problem Relation Age of Onset   Hypertension Other    Stroke Other    Diabetes Other    Heart attack Other    Obesity Other    Diabetes Father    Heart disease Father    Colon cancer Father    Breast cancer Paternal Grandmother    Healthy Daughter    Stomach cancer Neg Hx    Pancreatic cancer Neg Hx      Social History [name] reports that she has never smoked. She has never used smokeless tobacco. [name] reports no history of alcohol use.   Review of Systems CONSTITUTIONAL: No weight loss, fever, chills, weakness or fatigue.  HEENT: Eyes: No visual loss, blurred vision, double vision or yellow sclerae.No hearing loss, sneezing, congestion, runny nose or sore throat.  SKIN: No rash or itching.  CARDIOVASCULAR: per hpi RESPIRATORY: No  shortness of breath, cough or sputum.  GASTROINTESTINAL: No anorexia, nausea, vomiting or diarrhea. No abdominal pain or blood.  GENITOURINARY: No burning on urination, no polyuria NEUROLOGICAL: No headache, dizziness, syncope, paralysis, ataxia, numbness or tingling in the extremities. No change in bowel or bladder control.  MUSCULOSKELETAL: No muscle, back pain, joint pain or stiffness.  LYMPHATICS: No enlarged nodes. No history of splenectomy.  PSYCHIATRIC: No history of depression or anxiety.  ENDOCRINOLOGIC: No reports of sweating, cold or heat intolerance. No polyuria or polydipsia.  Marland Kitchen   Physical Examination Today's Vitals   05/12/22 1520  BP: 110/64  Pulse: 68  SpO2: 97%  Weight: 176 lb 3.2 oz (79.9 kg)  Height: '5\' 4"'$  (1.626 m)   Body mass index is 30.24 kg/m.  Gen: resting comfortably, no acute distress HEENT: no scleral icterus, pupils equal round and reactive, no palptable cervical adenopathy,  CV: RRR, no m/r/g, no jvd Resp: Clear to auscultation bilaterally GI: abdomen is soft, non-tender, non-distended, normal bowel sounds, no hepatosplenomegaly MSK: extremities are warm, no edema.  Skin: warm, no rash Neuro:  no focal deficits Psych: appropriate affect   Diagnostic Studies  08/2021 heart monitor 10 day monitor Occasional supraventricular ectopy in the form of isolated PACs, rare couplets and triplets. Frequent runs of SVT longest 29.2 seconds. Rare ventricular ectopy in the form of isolated PVCs, couplets. 2 runs of NSVT longest 8 beats. Reported symptoms correlated with sinus rhythm with PACs and short runs of SVT.     07/2021 nuclear stress   The study is normal. The study is low risk.   No ST deviation was noted.   LV perfusion is normal.   Left ventricular function is normal. End diastolic cavity size is normal.   07/2021 [facility] echo LVEF 45-50%, grade I dd, mild MR, mild AI   Assessment and Plan  1.SOB/LE edema - after extensive  cardiac and lymphedema workup primary issues appears to be low albumin. 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 Palpitations - SVT on prior monitors - current symptoms infrequent, just monitor at this time.         Arnoldo Lenis, M.D

## 2022-05-12 NOTE — Patient Instructions (Signed)
[facility]  Discharge Instructions: Thank you for choosing [facility] to provide your oncology and hematology care.  If you have a lab appointment with the [facility], please come in thru the Main Entrance and check in at the main information desk.  Wear comfortable clothing and clothing appropriate for easy access to any Portacath or PICC line.   We strive to give you quality time with your provider. You may need to reschedule your appointment if you arrive late (15 or more minutes).  Arriving late affects you and other patients whose appointments are after yours.  Also, if you miss three or more appointments without notifying the office, you may be dismissed from the clinic at the provider's discretion.      For prescription refill requests, have your pharmacy contact our office and allow 72 hours for refills to be completed.    Today you received the following chemotherapy and/or immunotherapy agents Feraheme IV iron.   To help prevent nausea and vomiting after your treatment, we encourage you to take your nausea medication as directed.  BELOW ARE SYMPTOMS THAT SHOULD BE REPORTED IMMEDIATELY: *FEVER GREATER THAN 100.4 F (38 C) OR HIGHER *CHILLS OR SWEATING *NAUSEA AND VOMITING THAT IS NOT CONTROLLED WITH YOUR NAUSEA MEDICATION *UNUSUAL SHORTNESS OF BREATH *UNUSUAL BRUISING OR BLEEDING *URINARY PROBLEMS (pain or burning when urinating, or frequent urination) *BOWEL PROBLEMS (unusual diarrhea, constipation, pain near the anus) TENDERNESS IN MOUTH AND THROAT WITH OR WITHOUT PRESENCE OF ULCERS (sore throat, sores in mouth, or a toothache) UNUSUAL RASH, SWELLING OR PAIN  UNUSUAL VAGINAL DISCHARGE OR ITCHING   Items with * indicate a potential emergency and should be followed up as soon as possible or go to the Emergency Department if any problems should occur.  Please show the CHEMOTHERAPY ALERT CARD or IMMUNOTHERAPY ALERT CARD at check-in to the  Emergency Department and triage nurse.  Should you have questions after your visit or need to cancel or reschedule your appointment, please contact [facility] [phone]  and follow the prompts.  Office hours are 8:00 a.m. to 4:30 p.m. Monday - Friday. Please note that voicemails left after 4:00 p.m. may not be returned until the following business day.  We are closed weekends and major holidays. You have access to a nurse at all times for urgent questions. Please call the main number to the clinic [phone] and follow the prompts.  For any non-urgent questions, you may also contact your provider using MyChart. We now offer e-Visits for anyone 57 and older to request care online for non-urgent symptoms. For details visit [email or web address].   Also download the MyChart app! Go to the app store, search "MyChart", open the app, select [facility], and log in with your MyChart username and password.  Masks are optional in the cancer centers. If you would like for your care team to wear a mask while they are taking care of you, please let them know. You may have one support person who is at least 69 years old accompany you for your appointments.

## 2022-05-13 ENCOUNTER — Encounter: Payer: Self-pay | Admitting: Gastroenterology

## 2022-05-14 ENCOUNTER — Ambulatory Visit: Payer: Medicare Other | Admitting: Gastroenterology

## 2022-05-25 ENCOUNTER — Inpatient Hospital Stay: Payer: Medicare Other | Attending: Hematology

## 2022-05-25 VITALS — BP 112/70 | HR 79 | Temp 97.6°F | Resp 18

## 2022-05-25 DIAGNOSIS — D509 Iron deficiency anemia, unspecified: Secondary | ICD-10-CM | POA: Diagnosis present

## 2022-05-25 DIAGNOSIS — D500 Iron deficiency anemia secondary to blood loss (chronic): Secondary | ICD-10-CM

## 2022-05-25 DIAGNOSIS — K922 Gastrointestinal hemorrhage, unspecified: Secondary | ICD-10-CM

## 2022-05-25 MED ORDER — HEPARIN SOD (PORK) LOCK FLUSH 100 UNIT/ML IV SOLN: 250.0000 [IU] | Freq: Once | INTRAVENOUS | Status: DC | PRN

## 2022-05-25 MED ORDER — DIPHENHYDRAMINE HCL 50 MG/ML IJ SOLN: 50.0000 mg | Freq: Once | INTRAMUSCULAR | Status: DC | PRN

## 2022-05-25 MED ORDER — SODIUM CHLORIDE 0.9% FLUSH: 3.0000 mL | Freq: Once | INTRAVENOUS | Status: DC | PRN

## 2022-05-25 MED ORDER — LORATADINE 10 MG PO TABS: 10.0000 mg | ORAL_TABLET | Freq: Once | ORAL | Status: DC

## 2022-05-25 MED ORDER — SODIUM CHLORIDE 0.9 % IV SOLN: Freq: Once | INTRAVENOUS | Status: AC

## 2022-05-25 MED ORDER — SODIUM CHLORIDE 0.9 % IV SOLN
510.0000 mg | Freq: Once | INTRAVENOUS | Status: AC
  Administered 2022-05-25: 510 mg via INTRAVENOUS
  Filled 2022-05-25: qty 510

## 2022-05-25 MED ORDER — ALTEPLASE 2 MG IJ SOLR: 2.0000 mg | Freq: Once | INTRAMUSCULAR | Status: DC | PRN

## 2022-05-25 MED ORDER — ALBUTEROL SULFATE HFA 108 (90 BASE) MCG/ACT IN AERS: 2.0000 | INHALATION_SPRAY | Freq: Once | RESPIRATORY_TRACT | Status: DC | PRN

## 2022-05-25 MED ORDER — SODIUM CHLORIDE 0.9% FLUSH
10.0000 mL | Freq: Once | INTRAVENOUS | Status: AC | PRN
  Administered 2022-05-25: 10 mL

## 2022-05-25 MED ORDER — ACETAMINOPHEN 325 MG PO TABS
650.0000 mg | ORAL_TABLET | Freq: Once | ORAL | Status: AC
  Administered 2022-05-25: 650 mg via ORAL
  Filled 2022-05-25: qty 2

## 2022-05-25 MED ORDER — EPINEPHRINE 0.3 MG/0.3ML IJ SOAJ: 0.3000 mg | Freq: Once | INTRAMUSCULAR | Status: DC | PRN

## 2022-05-25 MED ORDER — SODIUM CHLORIDE 0.9 % IV SOLN: Freq: Once | INTRAVENOUS | Status: DC | PRN

## 2022-05-25 MED ORDER — FAMOTIDINE IN NACL 20-0.9 MG/50ML-% IV SOLN: 20.0000 mg | Freq: Once | INTRAVENOUS | Status: DC | PRN

## 2022-05-25 MED ORDER — METHYLPREDNISOLONE SODIUM SUCC 125 MG IJ SOLR: 125.0000 mg | Freq: Once | INTRAMUSCULAR | Status: DC | PRN

## 2022-05-25 MED ORDER — HEPARIN SOD (PORK) LOCK FLUSH 100 UNIT/ML IV SOLN
500.0000 [IU] | Freq: Once | INTRAVENOUS | Status: AC | PRN
  Administered 2022-05-25: 500 [IU]

## 2022-05-25 NOTE — Progress Notes (Signed)
Patient presents today for Feraheme infusion per providers order.  Vital signs within parameters for treatment.  Patient took Claritin at home prior to appointment.  Patient has no new complaints at this time.   Treatment given today per MD orders.  Stable during infusion without adverse affects.  Vital signs stable.  No complaints at this time.  Discharge from clinic ambulatory in stable condition.  Alert and oriented X 3.  Follow up with [facility] as scheduled.

## 2022-05-25 NOTE — Patient Instructions (Signed)
[facility]  Discharge Instructions: Thank you for choosing [facility] to provide your oncology and hematology care.  If you have a lab appointment with the Cancer Center, please come in thru the Main Entrance and check in at the main information desk.  Wear comfortable clothing and clothing appropriate for easy access to any Portacath or PICC line.   We strive to give you quality time with your provider. You may need to reschedule your appointment if you arrive late (15 or more minutes).  Arriving late affects you and other patients whose appointments are after yours.  Also, if you miss three or more appointments without notifying the office, you may be dismissed from the clinic at the provider's discretion.      For prescription refill requests, have your pharmacy contact our office and allow 72 hours for refills to be completed.    Today you received the following chemotherapy and/or immunotherapy agents Feraheme      To help prevent nausea and vomiting after your treatment, we encourage you to take your nausea medication as directed.  BELOW ARE SYMPTOMS THAT SHOULD BE REPORTED IMMEDIATELY: *FEVER GREATER THAN 100.4 F (38 C) OR HIGHER *CHILLS OR SWEATING *NAUSEA AND VOMITING THAT IS NOT CONTROLLED WITH YOUR NAUSEA MEDICATION *UNUSUAL SHORTNESS OF BREATH *UNUSUAL BRUISING OR BLEEDING *URINARY PROBLEMS (pain or burning when urinating, or frequent urination) *BOWEL PROBLEMS (unusual diarrhea, constipation, pain near the anus) TENDERNESS IN MOUTH AND THROAT WITH OR WITHOUT PRESENCE OF ULCERS (sore throat, sores in mouth, or a toothache) UNUSUAL RASH, SWELLING OR PAIN  UNUSUAL VAGINAL DISCHARGE OR ITCHING   Items with * indicate a potential emergency and should be followed up as soon as possible or go to the Emergency Department if any problems should occur.  Please show the CHEMOTHERAPY ALERT CARD or IMMUNOTHERAPY ALERT CARD at check-in to the  Emergency Department and triage nurse.  Should you have questions after your visit or need to cancel or reschedule your appointment, please contact [facility] [phone]  and follow the prompts.  Office hours are 8:00 a.m. to 4:30 p.m. Monday - Friday. Please note that voicemails left after 4:00 p.m. may not be returned until the following business day.  We are closed weekends and major holidays. You have access to a nurse at all times for urgent questions. Please call the main number to the clinic [phone] and follow the prompts.  For any non-urgent questions, you may also contact your provider using MyChart. We now offer e-Visits for anyone 18 and older to request care online for non-urgent symptoms. For details visit [email or web address].   Also download the MyChart app! Go to the app store, search "MyChart", open the app, select [facility], and log in with your MyChart username and password.  Masks are optional in the cancer centers. If you would like for your care team to wear a mask while they are taking care of you, please let them know. You may have one support person who is at least 69 years old accompany you for your appointments.  

## 2022-05-27 ENCOUNTER — Encounter: Payer: Self-pay | Admitting: Gastroenterology

## 2022-05-28 NOTE — Telephone Encounter (Signed)
Good afternoon  I did get the lab results I reviewed them overall nothing that I am significantly concerned about.  The liver function tests have been a bit abnormal over the past few weeks and are about the same.  I am not too concerned about them I do believe that it is due to the TPN and has been gradually ease off I would expect the liver function test to gradually get better.  The albumin is 3.7 it is just a touch below normal but that can vary over the period of time.  Not a bad idea to do TPN for 5 nights on it 2 nights off and see how the labs are doing.  Let me know when you may have the next set of labs and I will keep an eye out for it. Dr. Vicente Males

## 2022-05-29 ENCOUNTER — Encounter: Payer: Self-pay | Admitting: Gastroenterology

## 2022-06-01 ENCOUNTER — Ambulatory Visit (HOSPITAL_COMMUNITY)
Admission: RE | Admit: 2022-06-01 | Discharge: 2022-06-01 | Disposition: A | Discharge status: Home / Self Care | Payer: Medicare Other | Source: Ambulatory Visit | Attending: Gastroenterology | Admitting: Gastroenterology

## 2022-06-01 DIAGNOSIS — Z789 Other specified health status: Secondary | ICD-10-CM | POA: Insufficient documentation

## 2022-06-01 DIAGNOSIS — R7989 Other specified abnormal findings of blood chemistry: Secondary | ICD-10-CM | POA: Diagnosis present

## 2022-06-01 NOTE — Progress Notes (Signed)
The common bile duct is dilated, could be related to cholecystectomy but I would recommend an MRCP to rule out any blockage.  Proceed with scheduling if the patient agrees

## 2022-06-01 NOTE — Progress Notes (Signed)
1.vitamin E low needs to discuss with dietician to get supplements in feed 2. Rest of labs stable/normal

## 2022-06-03 ENCOUNTER — Encounter: Payer: Self-pay | Admitting: Gastroenterology

## 2022-06-03 NOTE — Telephone Encounter (Signed)
Suggest to use MiraLAX as needed 1 capful a day or up to 2 times a day.

## 2022-06-03 NOTE — Telephone Encounter (Signed)
Herb Grays if pt having MRI abdomen at [name] this Thursday can they do MRCP at same time?

## 2022-06-03 NOTE — Progress Notes (Signed)
Lft are improving

## 2022-06-10 ENCOUNTER — Other Ambulatory Visit: Payer: Self-pay

## 2022-06-10 DIAGNOSIS — K838 Other specified diseases of biliary tract: Secondary | ICD-10-CM

## 2022-06-11 ENCOUNTER — Encounter: Payer: Self-pay | Admitting: Gastroenterology

## 2022-06-12 NOTE — Progress Notes (Signed)
LFT's almost back to normal . Am not concerned.  Magnesium a bit low

## 2022-06-19 NOTE — Telephone Encounter (Signed)
We only have labs from 9/27- non from October, if weight stable and taking orally get PICC out  . Once PICC out close watch on weight

## 2022-06-22 ENCOUNTER — Encounter: Payer: Self-pay | Admitting: Gastroenterology

## 2022-06-22 NOTE — Progress Notes (Signed)
Labs look stable nothing am too concerned about- magnesium is low needs replacement

## 2022-06-30 NOTE — Telephone Encounter (Signed)
Since weight stable and eating drinking well- get PICC line out .   Continue to monitor weights and check labs in 2 week s

## 2022-07-02 ENCOUNTER — Encounter: Payer: Self-pay | Admitting: Gastroenterology

## 2022-07-09 NOTE — Progress Notes (Signed)
Potassium levels are high - possible hemolysis would recommend to repeat , otherwise stable- has PICC been taken out>?

## 2022-07-10 NOTE — Telephone Encounter (Signed)
MRI report suggests the lesions are benign appearing . Doesn't comment on the liver of bile ducts- still would need to go ahead with the ct we discussed

## 2022-07-13 ENCOUNTER — Ambulatory Visit (INDEPENDENT_AMBULATORY_CARE_PROVIDER_SITE_OTHER): Payer: Medicare Other | Admitting: Obstetrics & Gynecology

## 2022-07-13 ENCOUNTER — Encounter: Payer: Self-pay | Admitting: Obstetrics & Gynecology

## 2022-07-13 ENCOUNTER — Other Ambulatory Visit (HOSPITAL_COMMUNITY)
Admission: RE | Admit: 2022-07-13 | Discharge: 2022-07-13 | Disposition: A | Discharge status: Home / Self Care | Payer: Medicare Other | Source: Ambulatory Visit | Attending: Obstetrics & Gynecology | Admitting: Obstetrics & Gynecology

## 2022-07-13 VITALS — BP 119/82 | HR 79 | Ht 64.0 in | Wt 179.4 lb

## 2022-07-13 DIAGNOSIS — Z1151 Encounter for screening for human papillomavirus (HPV): Secondary | ICD-10-CM | POA: Insufficient documentation

## 2022-07-13 DIAGNOSIS — Z124 Encounter for screening for malignant neoplasm of cervix: Secondary | ICD-10-CM

## 2022-07-13 DIAGNOSIS — N83202 Unspecified ovarian cyst, left side: Secondary | ICD-10-CM | POA: Diagnosis not present

## 2022-07-13 DIAGNOSIS — Z01419 Encounter for gynecological examination (general) (routine) without abnormal findings: Secondary | ICD-10-CM | POA: Diagnosis present

## 2022-07-13 NOTE — Progress Notes (Signed)
GYN VISIT Patient name: [name] Burns MRN [id number]  Date of birth: 04-19-1951 Chief Complaint:   Gynecologic Exam  History of Present Illness:   [name] is a 71 y.o. G2P1011 PM female being seen today for the following concerns;  Left ovarian cyst:  Initially imaging was completed due to pain and a "lump" on the right.  She reports this started over the summer.  Noted worsening pain with movement.  Imaging was completed (seen below).  Of note, today she reports that the pain and lump have now resolved.  Denies vaginal bleeding, discharge, itching or irritation.  Reports no acute complaints today. Notes long standing h/o decreased appetite and bloating due to gastric surgery.  Prior US reviewed: 05/2022: 6x3.6x6cm septate cyst in left adnexa Follow up MRI 06/2022: Left adnexal lesion- 5.8x 3.1cm.  Prior lesion seen in 2018 measuring 5cm. BIRAD cat 3- <5% risk of malignancy  No LMP recorded. Patient is postmenopausal.     07/13/2022    9:29 AM 09/25/2016    1:48 PM 12/17/2015    9:23 AM  Depression screen PHQ 2/9  Decreased Interest 0 0 0  Down, Depressed, Hopeless 0 0 0  PHQ - 2 Score 0 0 0  Altered sleeping 1    Tired, decreased energy 2    Change in appetite 1    Feeling bad or failure about yourself  0    Trouble concentrating 0    Moving slowly or fidgety/restless 1    Suicidal thoughts 0    PHQ-9 Score 5       Review of Systems:   Pertinent items are noted in HPI Denies fever/chills, dizziness, headaches, visual disturbances, fatigue, shortness of breath, chest pain, abdominal pain, vomiting, no problems with bowel movements, urination, or intercourse unless otherwise stated above.  Pertinent History Reviewed:  Reviewed past medical,surgical, social, obstetrical and family history.  Reviewed problem list, medications and allergies. Physical Assessment:   Vitals:   07/13/22 0930  BP: 119/82  Pulse: 79  Weight: 179 lb 6.4 oz (81.4 kg)  Height: 5'  4" (1.626 m)  Body mass index is 30.79 kg/m.       Physical Examination:   General appearance: alert, well appearing, and in no distress  Psych: mood appropriate, normal affect  Skin: warm & dry   Cardiovascular: normal heart rate noted  Respiratory: normal respiratory effort, no distress, CTAB  Abdomen: soft, non-tender, no rebound, no guarding  Pelvic: VULVA: normal appearing vulva with no masses, tenderness or lesions, VAGINA: normal appearing vagina with normal color and discharge, no lesions, CERVIX: normal appearing cervix without discharge or lesions, UTERUS: uterus is normal size, shape, consistency and nontender, ADNEXA: left adnexal fullness noted- no pain or tenderness  Extremities: 2+ edema, no calf tenderness bilaterally  Chaperone:  Dr. 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   Myna Hidalgo, DO Attending Obstetrician & Gynecologist, [facility] for [employer], [facility]

## 2022-07-16 LAB — CYTOLOGY - PAP
Comment: NEGATIVE
Diagnosis: NEGATIVE
High risk HPV: NEGATIVE

## 2022-07-20 ENCOUNTER — Encounter (HOSPITAL_COMMUNITY): Payer: Self-pay

## 2022-07-20 ENCOUNTER — Encounter: Payer: Self-pay | Admitting: Gastroenterology

## 2022-07-20 ENCOUNTER — Ambulatory Visit (HOSPITAL_COMMUNITY)
Admission: RE | Admit: 2022-07-20 | Discharge: 2022-07-20 | Disposition: A | Discharge status: Home / Self Care | Payer: Medicare Other | Source: Ambulatory Visit | Attending: Gastroenterology | Admitting: Gastroenterology

## 2022-07-20 DIAGNOSIS — K838 Other specified diseases of biliary tract: Secondary | ICD-10-CM | POA: Insufficient documentation

## 2022-07-20 MED ORDER — IOHEXOL 300 MG/ML  SOLN
80.0000 mL | Freq: Once | INTRAMUSCULAR | Status: AC | PRN
  Administered 2022-07-20: 80 mL via INTRAVENOUS

## 2022-07-21 ENCOUNTER — Other Ambulatory Visit: Payer: Self-pay

## 2022-07-21 DIAGNOSIS — R7989 Other specified abnormal findings of blood chemistry: Secondary | ICD-10-CM

## 2022-07-21 DIAGNOSIS — E43 Unspecified severe protein-calorie malnutrition: Secondary | ICD-10-CM

## 2022-07-23 ENCOUNTER — Other Ambulatory Visit: Payer: Self-pay | Admitting: Neurology

## 2022-07-23 NOTE — Progress Notes (Signed)
All findings appear old and stable nothing to do at this time.

## 2022-07-29 MED ORDER — GABAPENTIN 300 MG PO CAPS: 600.0000 mg | ORAL_CAPSULE | Freq: Three times a day (TID) | ORAL | 3 refills | Status: DC

## 2022-07-29 NOTE — Progress Notes (Signed)
Labs stable/improving - repeat CBC,CMP in 6 weeks , advise to send [place] message of her weight chrt to ensure she is not losing weight

## 2022-07-30 LAB — COMPREHENSIVE METABOLIC PANEL
ALT: 52 IU/L — ABNORMAL HIGH (ref 0–32)
AST: 41 IU/L — ABNORMAL HIGH (ref 0–40)
Albumin/Globulin Ratio: 2 (ref 1.2–2.2)
Albumin: 3.9 g/dL (ref 3.9–4.9)
Alkaline Phosphatase: 59 IU/L (ref 44–121)
BUN/Creatinine Ratio: 23 (ref 12–28)
BUN: 14 mg/dL (ref 8–27)
Bilirubin Total: 0.5 mg/dL (ref 0.0–1.2)
CO2: 22 mmol/L (ref 20–29)
Calcium: 8.9 mg/dL (ref 8.7–10.3)
Chloride: 107 mmol/L — ABNORMAL HIGH (ref 96–106)
Creatinine, Ser: 0.62 mg/dL (ref 0.57–1.00)
Globulin, Total: 2 g/dL (ref 1.5–4.5)
Glucose: 68 mg/dL — ABNORMAL LOW (ref 70–99)
Potassium: 3.6 mmol/L (ref 3.5–5.2)
Sodium: 143 mmol/L (ref 134–144)
Total Protein: 5.9 g/dL — ABNORMAL LOW (ref 6.0–8.5)
eGFR: 96 mL/min/{1.73_m2} (ref >59)

## 2022-07-30 LAB — IMMUNOGLOBULINS A/E/G/M, SERUM
IgA/Immunoglobulin A, Serum: 209 mg/dL (ref 87–352)
IgE (Immunoglobulin E), Serum: 13 IU/mL (ref 6–495)
IgG (Immunoglobin G), Serum: 606 mg/dL (ref 586–1602)
IgM (Immunoglobulin M), Srm: 155 mg/dL (ref 26–217)

## 2022-07-30 LAB — HEPATITIS A ANTIBODY, TOTAL: hep A Total Ab: NEGATIVE

## 2022-07-30 LAB — IRON,TIBC AND FERRITIN PANEL
Ferritin: 215 ng/mL — ABNORMAL HIGH (ref 15–150)
Iron Saturation: 33 % (ref 15–55)
Iron: 93 ug/dL (ref 27–139)
Total Iron Binding Capacity: 285 ug/dL (ref 250–450)
UIBC: 192 ug/dL (ref 118–369)

## 2022-07-30 LAB — HIV ANTIBODY (ROUTINE TESTING W REFLEX): HIV Screen 4th Generation wRfx: NONREACTIVE

## 2022-07-30 LAB — CELIAC DISEASE AB SCREEN W/RFX
Antigliadin Abs, IgA: 4 units (ref 0–19)
Transglutaminase IgA: <2 U/mL (ref 0–3)

## 2022-07-30 LAB — CERULOPLASMIN: Ceruloplasmin: 27.9 mg/dL (ref 19.0–39.0)

## 2022-07-30 LAB — ANTI-MICROSOMAL ANTIBODY LIVER / KIDNEY: LKM1 Ab: 1 Units (ref 0.0–20.0)

## 2022-07-30 LAB — HEPATITIS C ANTIBODY: Hep C Virus Ab: NONREACTIVE

## 2022-07-30 LAB — HEPATITIS B SURFACE ANTIGEN: Hepatitis B Surface Ag: NEGATIVE

## 2022-07-30 LAB — PROTIME-INR
INR: 1 (ref 0.9–1.2)
Prothrombin Time: 10.5 s (ref 9.1–12.0)

## 2022-07-30 LAB — HEPATITIS B CORE ANTIBODY, TOTAL: Hep B Core Total Ab: NEGATIVE

## 2022-07-30 LAB — MITOCHONDRIAL/SMOOTH MUSCLE AB PNL
Mitochondrial Ab: <20 Units (ref 0.0–20.0)
Smooth Muscle Ab: 5 Units (ref 0–19)

## 2022-07-30 LAB — HEPATITIS B SURFACE ANTIBODY,QUALITATIVE: Hep B Surface Ab, Qual: NONREACTIVE

## 2022-07-30 LAB — CK: Total CK: 30 U/L — ABNORMAL LOW (ref 32–182)

## 2022-07-30 LAB — HEPATITIS B E ANTIBODY: Hep B E Ab: NEGATIVE

## 2022-07-30 LAB — HEPATITIS B E ANTIGEN: Hep B E Ag: NEGATIVE

## 2022-07-30 LAB — ANA: Anti Nuclear Antibody (ANA): NEGATIVE

## 2022-07-30 LAB — ALPHA-1-ANTITRYPSIN: A-1 Antitrypsin: 142 mg/dL (ref 101–187)

## 2022-08-01 LAB — VITAMIN E
Vitamin E (Alpha Tocopherol): 10.1 mg/L (ref 9.0–29.0)
Vitamin E(Gamma Tocopherol): 0.4 mg/L — ABNORMAL LOW (ref 0.5–4.9)

## 2022-08-01 LAB — VITAMIN A: Vitamin A: 41.9 ug/dL (ref 22.0–69.5)

## 2022-08-03 NOTE — Progress Notes (Signed)
Labs are stable- would suggest to continue daily multivitamin which has vitamin E in it

## 2022-08-24 ENCOUNTER — Encounter: Payer: Self-pay | Admitting: Gastroenterology

## 2022-09-03 ENCOUNTER — Other Ambulatory Visit: Payer: Self-pay

## 2022-09-03 DIAGNOSIS — R7989 Other specified abnormal findings of blood chemistry: Secondary | ICD-10-CM

## 2022-09-04 LAB — CBC WITH DIFFERENTIAL/PLATELET
Basophils Absolute: 0 10*3/uL (ref 0.0–0.2)
Basos: 0 %
EOS (ABSOLUTE): 0.1 10*3/uL (ref 0.0–0.4)
Eos: 2 %
Hematocrit: 39.2 % (ref 34.0–46.6)
Hemoglobin: 13 g/dL (ref 11.1–15.9)
Immature Grans (Abs): 0 10*3/uL (ref 0.0–0.1)
Immature Granulocytes: 0 %
Lymphocytes Absolute: 1.1 10*3/uL (ref 0.7–3.1)
Lymphs: 24 %
MCH: 31.1 pg (ref 26.6–33.0)
MCHC: 33.2 g/dL (ref 31.5–35.7)
MCV: 94 fL (ref 79–97)
Monocytes Absolute: 0.3 10*3/uL (ref 0.1–0.9)
Monocytes: 8 %
Neutrophils Absolute: 3 10*3/uL (ref 1.4–7.0)
Neutrophils: 66 %
Platelets: 175 10*3/uL (ref 150–450)
RBC: 4.18 x10E6/uL (ref 3.77–5.28)
RDW: 12.3 % (ref 11.7–15.4)
WBC: 4.5 10*3/uL (ref 3.4–10.8)

## 2022-09-04 LAB — COMPREHENSIVE METABOLIC PANEL
ALT: 60 IU/L — ABNORMAL HIGH (ref 0–32)
AST: 44 IU/L — ABNORMAL HIGH (ref 0–40)
Albumin/Globulin Ratio: 1.9 (ref 1.2–2.2)
Albumin: 3.9 g/dL (ref 3.9–4.9)
Alkaline Phosphatase: 56 IU/L (ref 44–121)
BUN/Creatinine Ratio: 20 (ref 12–28)
BUN: 11 mg/dL (ref 8–27)
Bilirubin Total: 0.7 mg/dL (ref 0.0–1.2)
CO2: 23 mmol/L (ref 20–29)
Calcium: 8 mg/dL — ABNORMAL LOW (ref 8.7–10.3)
Chloride: 108 mmol/L — ABNORMAL HIGH (ref 96–106)
Creatinine, Ser: 0.56 mg/dL — ABNORMAL LOW (ref 0.57–1.00)
Globulin, Total: 2.1 g/dL (ref 1.5–4.5)
Glucose: 64 mg/dL — ABNORMAL LOW (ref 70–99)
Potassium: 3.8 mmol/L (ref 3.5–5.2)
Sodium: 143 mmol/L (ref 134–144)
Total Protein: 6 g/dL (ref 6.0–8.5)
eGFR: 99 mL/min/{1.73_m2} (ref >59)

## 2022-09-04 LAB — TSH: TSH: 3.09 u[IU]/mL (ref 0.450–4.500)

## 2022-09-04 NOTE — Progress Notes (Signed)
Labs looks stable- inform to repeat in 3-4 months, watch weight and if anything changes to let me know   Dr Jonathon Bellows MD,MRCP [facility]) Gastroenterology/Hepatology Pager: [phone]

## 2022-09-18 ENCOUNTER — Inpatient Hospital Stay: Payer: Medicare Other | Attending: Hematology

## 2022-09-18 DIAGNOSIS — Z9884 Bariatric surgery status: Secondary | ICD-10-CM | POA: Diagnosis not present

## 2022-09-18 DIAGNOSIS — E610 Copper deficiency: Secondary | ICD-10-CM | POA: Diagnosis not present

## 2022-09-18 DIAGNOSIS — D500 Iron deficiency anemia secondary to blood loss (chronic): Secondary | ICD-10-CM

## 2022-09-18 DIAGNOSIS — D509 Iron deficiency anemia, unspecified: Secondary | ICD-10-CM | POA: Insufficient documentation

## 2022-09-18 DIAGNOSIS — D649 Anemia, unspecified: Secondary | ICD-10-CM

## 2022-09-18 DIAGNOSIS — K909 Intestinal malabsorption, unspecified: Secondary | ICD-10-CM | POA: Diagnosis not present

## 2022-09-18 DIAGNOSIS — E538 Deficiency of other specified B group vitamins: Secondary | ICD-10-CM | POA: Insufficient documentation

## 2022-09-18 LAB — CBC WITH DIFFERENTIAL/PLATELET
Abs Immature Granulocytes: 0.01 10*3/uL (ref 0.00–0.07)
Basophils Absolute: 0 10*3/uL (ref 0.0–0.1)
Basophils Relative: 0 %
Eosinophils Absolute: 0.1 10*3/uL (ref 0.0–0.5)
Eosinophils Relative: 2 %
HCT: 41.6 % (ref 36.0–46.0)
Hemoglobin: 13.6 g/dL (ref 12.0–15.0)
Immature Granulocytes: 0 %
Lymphocytes Relative: 22 %
Lymphs Abs: 1.2 10*3/uL (ref 0.7–4.0)
MCH: 31.7 pg (ref 26.0–34.0)
MCHC: 32.7 g/dL (ref 30.0–36.0)
MCV: 97 fL (ref 80.0–100.0)
Monocytes Absolute: 0.4 10*3/uL (ref 0.1–1.0)
Monocytes Relative: 8 %
Neutro Abs: 3.8 10*3/uL (ref 1.7–7.7)
Neutrophils Relative %: 68 %
Platelets: 174 10*3/uL (ref 150–400)
RBC: 4.29 MIL/uL (ref 3.87–5.11)
RDW: 12.8 % (ref 11.5–15.5)
WBC: 5.6 10*3/uL (ref 4.0–10.5)
nRBC: 0 % (ref 0.0–0.2)

## 2022-09-18 LAB — IRON AND TIBC
Iron: 110 ug/dL (ref 28–170)
Saturation Ratios: 34 % — ABNORMAL HIGH (ref 10.4–31.8)
TIBC: 321 ug/dL (ref 250–450)
UIBC: 211 ug/dL

## 2022-09-18 LAB — VITAMIN B12: Vitamin B-12: 603 pg/mL (ref 180–914)

## 2022-09-18 LAB — FOLATE: Folate: 17.2 ng/mL (ref >5.9)

## 2022-09-18 LAB — FERRITIN: Ferritin: 143 ng/mL (ref 11–307)

## 2022-09-22 LAB — METHYLMALONIC ACID, SERUM: Methylmalonic Acid, Quantitative: 137 nmol/L (ref 0–378)

## 2022-09-22 IMAGING — DX DG CHEST 2V
2 series · 2 of 2 positions shown · non-contrast
Comparison: 08/21/2021

CLINICAL DATA: 70-year-old female with pleural effusion

EXAM:
CHEST - 2 VIEW

[chest pa]
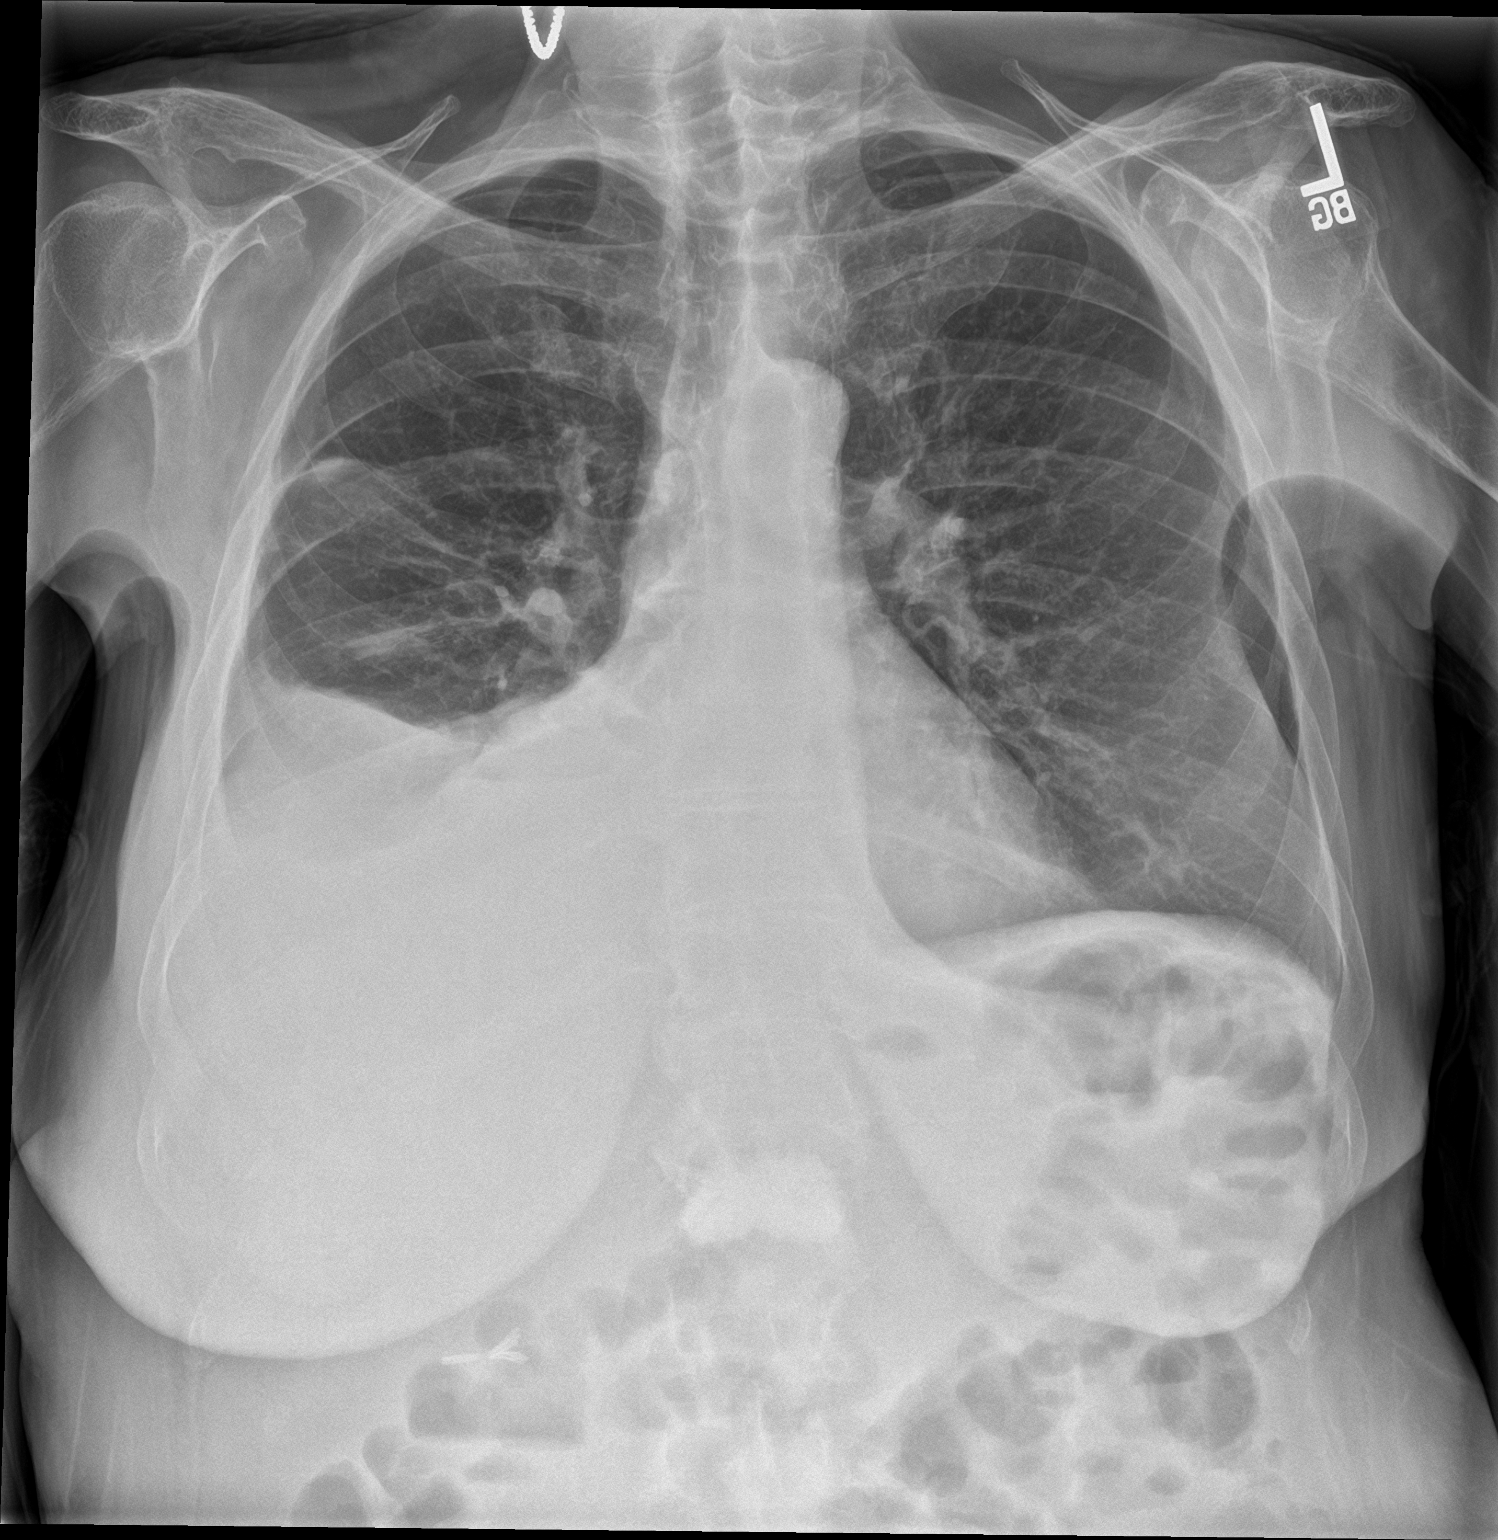

[chest lat]
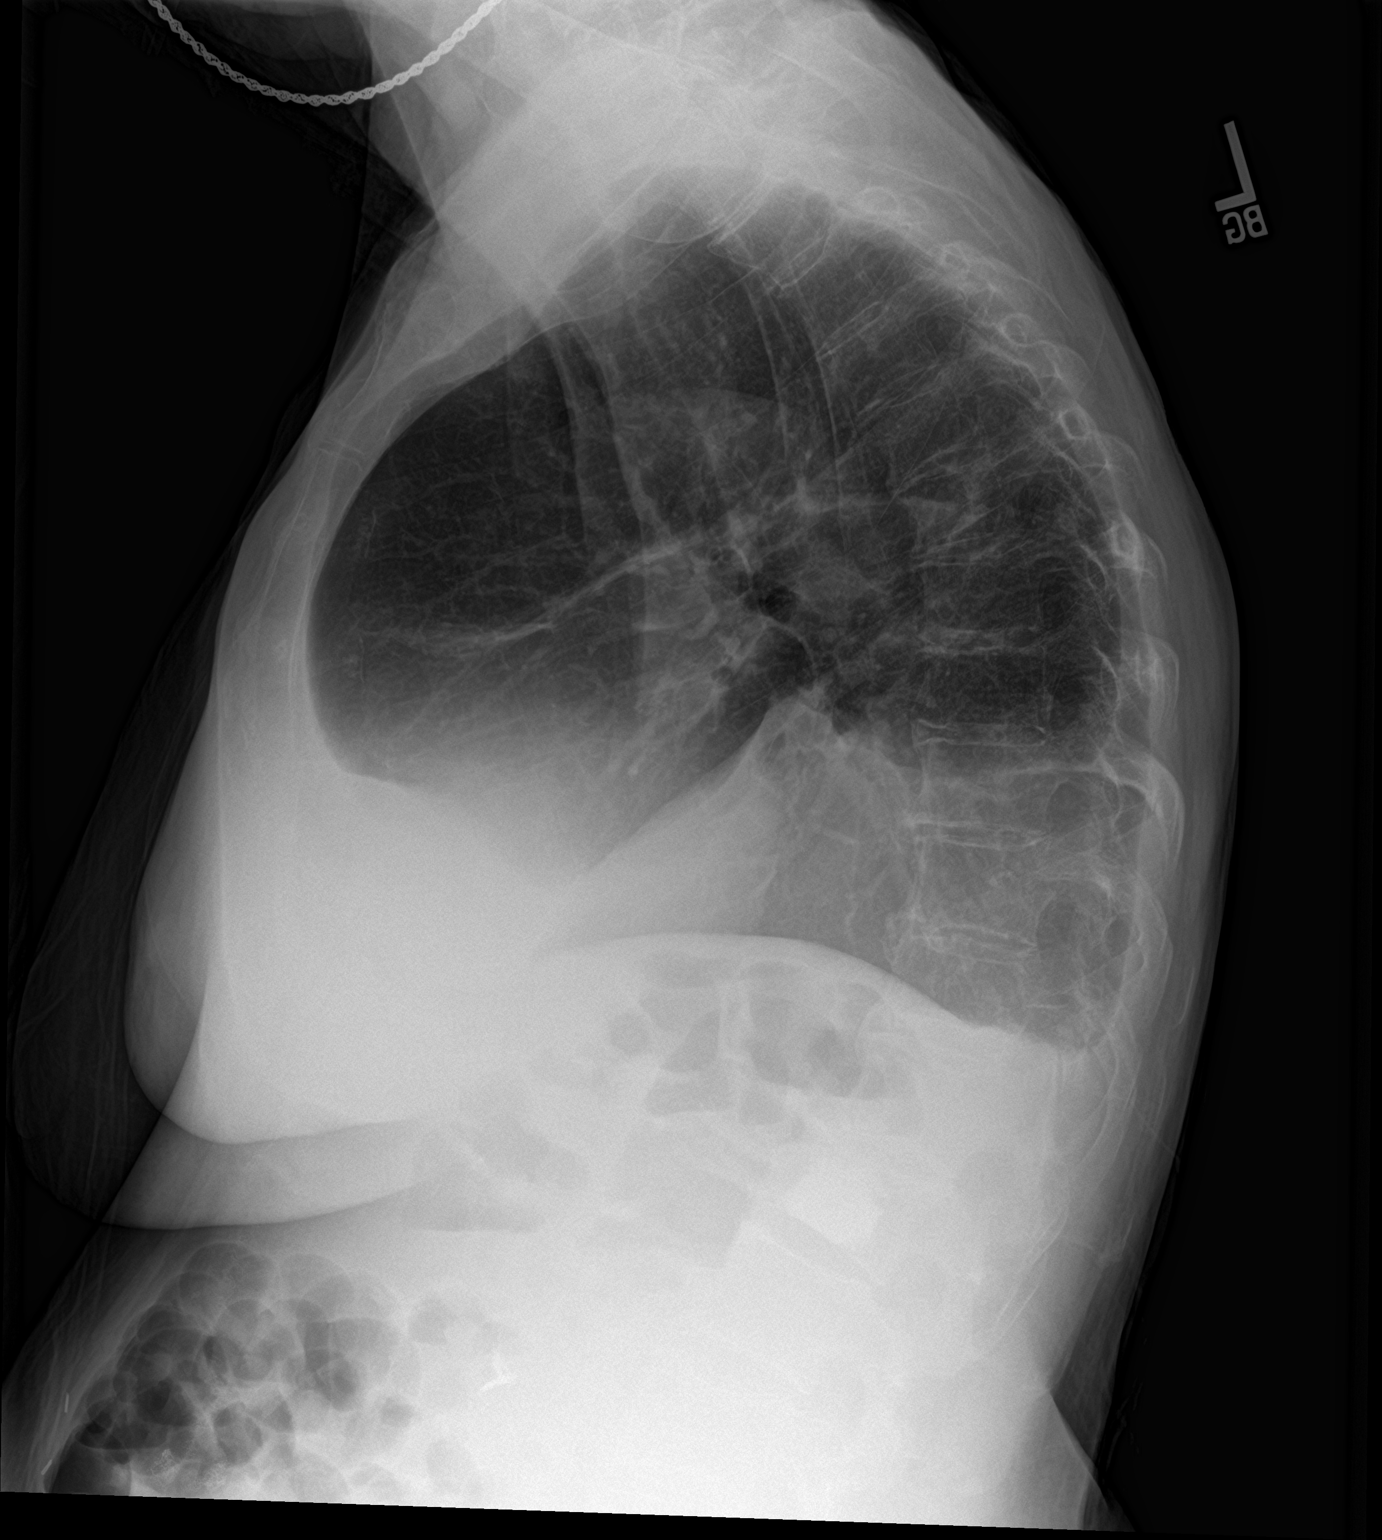

[2 of 2 positions shown; findings below may reference images not displayed]

FINDINGS: Cardiomediastinal silhouette likely unchanged with a right heart
border partially obscured by overlying lung/pleural disease.

Opacity at the right lung base, similar to that on the chest x-ray
08/21/2021, with associated meniscus on the lateral view. Right
heart border and the right hemidiaphragm are obscured.

No pneumothorax.

Left lung relatively well aerated.

Degenerative changes the spine.  No acute displaced fracture.
IMPRESSION: Opacity at the right lung base, likely a combination of pleural
effusion and associated atelectasis/consolidation. Appearance
similar to that on the chest x-ray 08/21/2021.

## 2022-09-23 ENCOUNTER — Encounter: Payer: Self-pay | Admitting: Neurology

## 2022-09-23 LAB — COPPER, SERUM: Copper: 139 ug/dL (ref 80–158)

## 2022-09-24 IMAGING — US US THORACENTESIS ASP PLEURAL SPACE W/IMG GUIDE
1 series · 4 of 4 positions shown · non-contrast
Comparison: none

INDICATION: Right pleural effusion

[Series 1: us thoracentesis asp pleural space w/img guide · 4 of 4 slices shown]
[im 1/4]
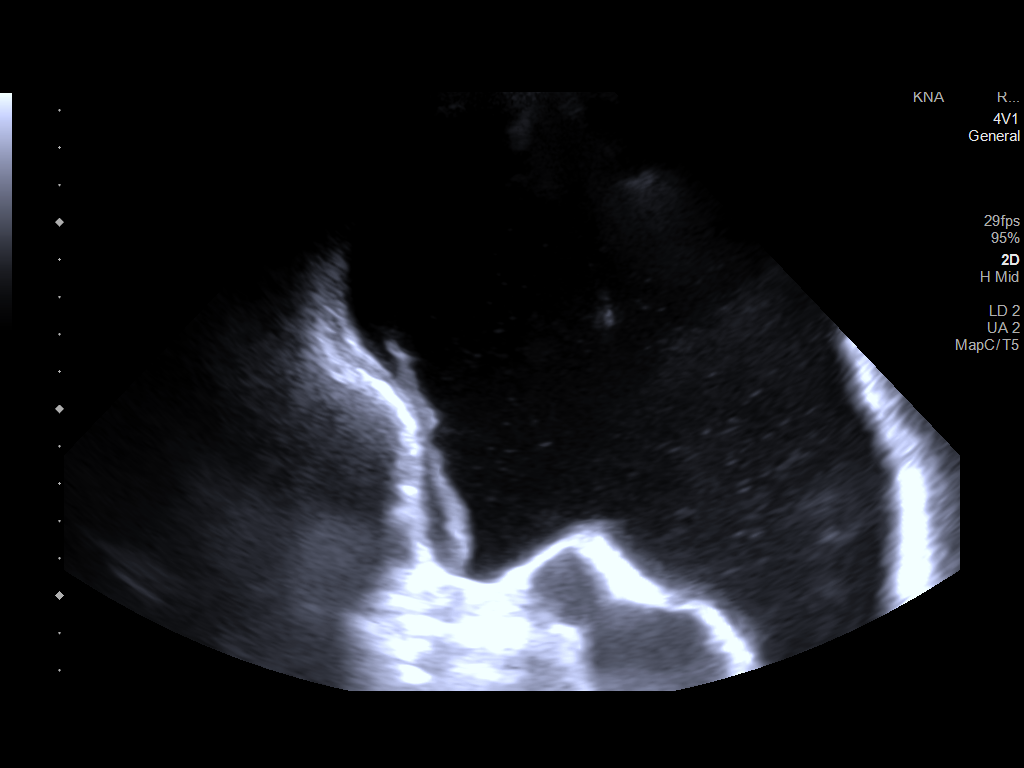
[im 2/4]
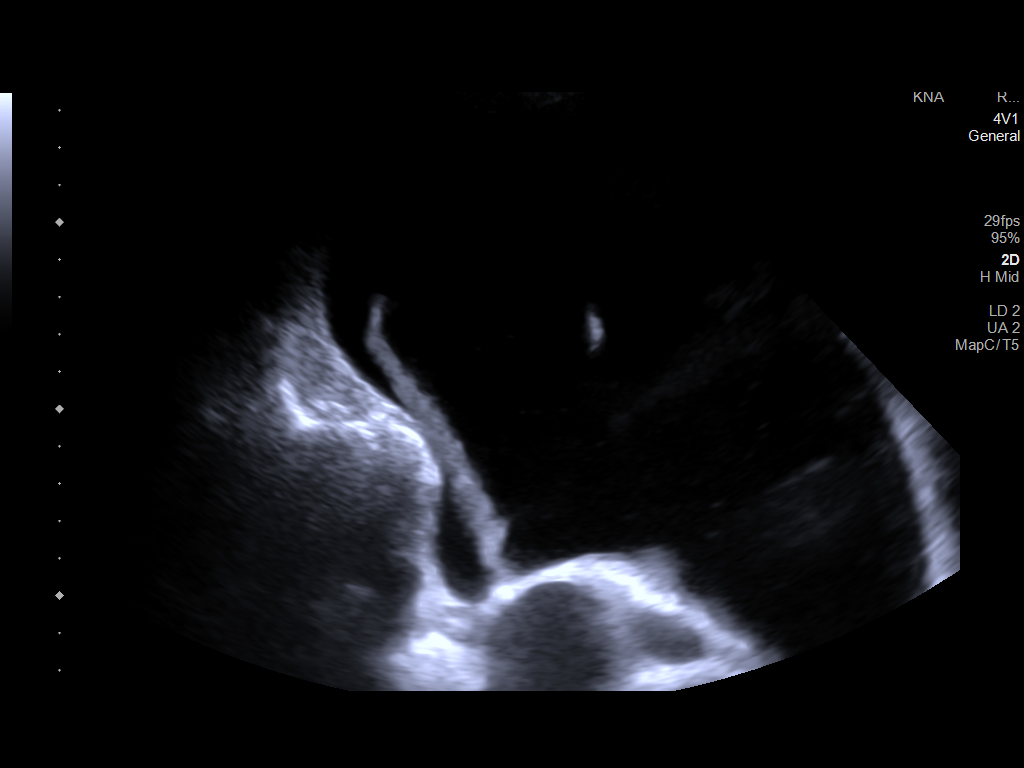
[im 3/4]
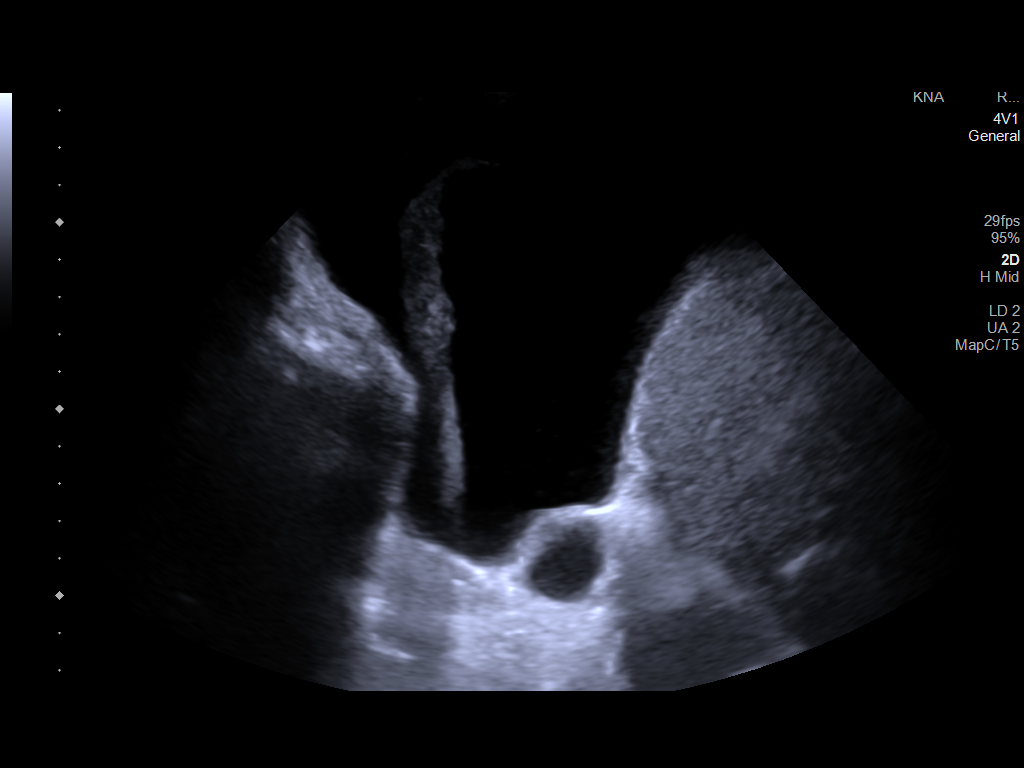
[im 4/4]
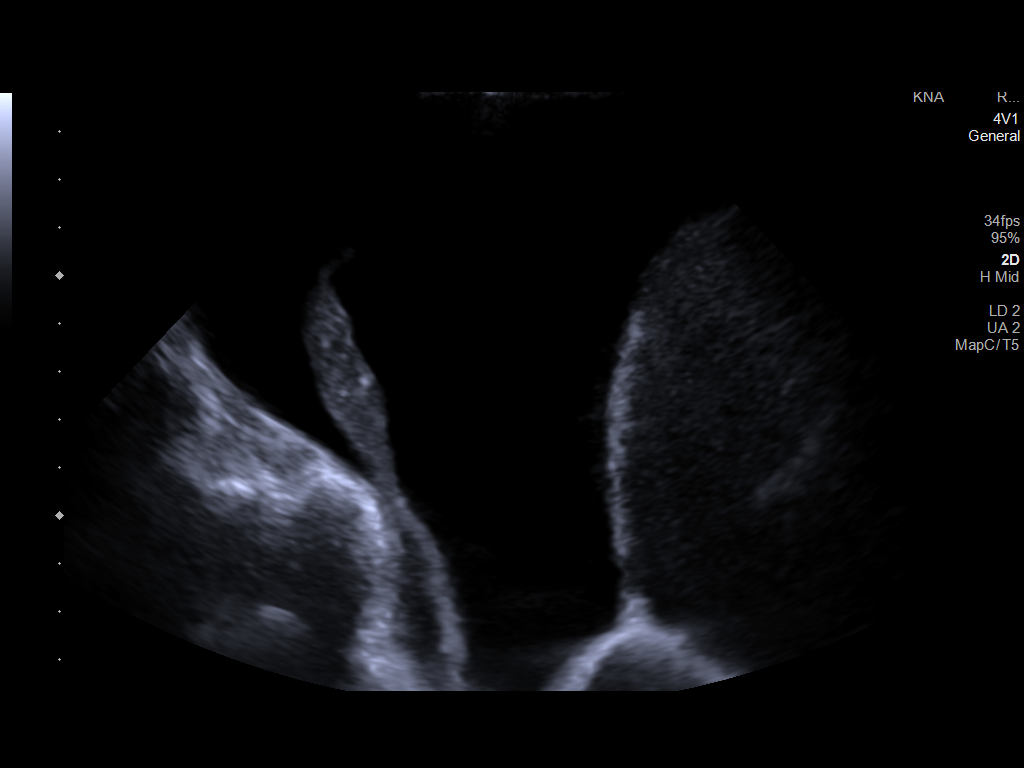

[4 of 4 positions shown; findings below may reference images not displayed]

EXAM:
ULTRASOUND GUIDED RIGHT THORACENTESIS

MEDICATIONS:
10 mL of 1% lidocaine.

COMPLICATIONS:
None immediate.

PROCEDURE:
An ultrasound guided thoracentesis was thoroughly discussed with the
patient and questions answered. The benefits, risks, alternatives
and complications were also discussed. The patient understands and
wishes to proceed with the procedure. Written consent was obtained.

Ultrasound was performed to localize and mark an adequate pocket of
fluid in the RIGHT chest. The area was then prepped and draped in
the normal sterile fashion. 1% Lidocaine was used for local
anesthesia. Under ultrasound guidance a Yueh catheter was
introduced. Thoracentesis was performed. The catheter was removed
and a dressing applied.
FINDINGS: A total of approximately 1 liter of straw colored fluid was removed.
Samples were sent to the laboratory as requested by the clinical
team.
IMPRESSION: Successful ultrasound guided RIGHT thoracentesis yielding 1 liter of
pleural fluid.

Read by Rambriksh Tiger

## 2022-09-24 NOTE — Progress Notes (Signed)
VIRTUAL VISIT via [place]   I connected with [name]  on 09/25/22 at 3:00 PM by telephone and verified that I am speaking with the correct person using two identifiers.  Location: Patient: Home Provider: [facility]   I discussed the limitations, risks, security and privacy concerns of performing an evaluation and management service by telephone and the availability of in person appointments. I also discussed with the patient that there may be a patient responsible charge related to this service. The patient expressed understanding and agreed to proceed.  REASON FOR VISIT:  Follow-up for iron deficiency anemia   PRIOR THERAPY: None   CURRENT THERAPY: Intermittent IV iron (last Feraheme on 05/12/2022 and 05/25/2022)  INTERVAL HISTORY:  [name] is contacted today for follow-up of her iron deficiency anemia.  She was last seen by Tarri Abernethy PA-C on 05/05/2022.  At today's visit, she reports feeling fairly well.  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  She was able to come off of TPN in October 2023.   She denies any signs of blood loss such as bright red blood per rectum, melena, epistaxis, or hematuria.  She has some fatigue with energy about 75%, improved but not yet at baseline following IV iron infusion in August/September 2023.  She has episodes of lightheadedness, which she thinks may be from her gabapentin. She denies any pica, restless legs, headaches, chest pain, dyspnea on exertion, or syncope.  She stopped taking folic acid and copper June 2023.   REVIEW OF SYSTEMS:   Review of Systems  Constitutional:  Negative for chills, diaphoresis, fever, malaise/fatigue and weight loss.  Respiratory:  Negative for cough and shortness of breath.   Cardiovascular:  Negative for chest pain and  palpitations.  Gastrointestinal:  Negative for abdominal pain, blood in stool, melena, nausea and vomiting.  Neurological:  Positive for dizziness. Negative for headaches.  Psychiatric/Behavioral:  The patient has insomnia.      PHYSICAL EXAM: (per limitations of virtual telephone visit)  The patient is alert and oriented x 3, exhibiting adequate mentation, good mood, and ability to speak in full sentences and execute sound judgement.  ASSESSMENT & PLAN:  1.  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 - History of anastomotic ulcer many years ago - Episode of hematochezia and fecal occult blood positive in September 2018, required PRBC transfusions - Most recent EGD/colonoscopy (12/12/2021): Normal esophagus.  Roux-en-Y gastrojejunostomy with gastrojejunal anastomosis characterized by congestion, edema, and erythema.  Normal colon. - She receives intermittent IV Feraheme, most recently on 05/25/2022.  She reports that her energy improves after IV iron. - She denies any epistaxis, hematemesis, hematochezia, melena    - Her fatigue has significantly improved - She required TPN from March 2023 through October 2023 - Most recent labs (09/18/2022): Normal B12 and MMA. Iron improved with ferritin 143, iron saturation 34 %. Copper has normalized.  Folate normal. - Most recent CBC (09/18/2022): Normal CBC, Hgb 13.6 - PLAN: No indication for IV iron at this time. - No indication to restart copper folic acid supplements at this time.  We will recheck levels at follow-up. - Repeat labs with follow-up visit in 4 months   PLAN SUMMARY: >> Labs  in 4 months (CBC/D, B12, MMA, ferritin, iron/TIBC, folate, copper) >> PHONE visit 2 weeks after labs     I discussed the assessment and treatment plan with the patient. The patient was  provided an opportunity to ask questions and all were answered. The patient agreed with the plan and demonstrated an understanding of the instructions.   The patient was advised to call back or seek an in-person evaluation if the symptoms worsen or if the condition fails to improve as anticipated.  I provided 22 minutes of non-face-to-face time during this encounter.   Harriett Rush, PA-C 09/25/22 3:32 PM

## 2022-09-25 ENCOUNTER — Inpatient Hospital Stay (HOSPITAL_BASED_OUTPATIENT_CLINIC_OR_DEPARTMENT_OTHER): Payer: Medicare Other | Admitting: Physician Assistant

## 2022-09-25 DIAGNOSIS — D500 Iron deficiency anemia secondary to blood loss (chronic): Secondary | ICD-10-CM | POA: Diagnosis not present

## 2022-09-25 DIAGNOSIS — E538 Deficiency of other specified B group vitamins: Secondary | ICD-10-CM | POA: Diagnosis not present

## 2022-09-25 DIAGNOSIS — E610 Copper deficiency: Secondary | ICD-10-CM

## 2022-09-25 DIAGNOSIS — K909 Intestinal malabsorption, unspecified: Secondary | ICD-10-CM

## 2022-10-27 ENCOUNTER — Telehealth: Payer: Self-pay | Admitting: Neurology

## 2022-10-27 ENCOUNTER — Encounter: Payer: Self-pay | Admitting: Neurology

## 2022-10-27 ENCOUNTER — Ambulatory Visit (INDEPENDENT_AMBULATORY_CARE_PROVIDER_SITE_OTHER): Payer: Medicare Other | Admitting: Neurology

## 2022-10-27 VITALS — BP 104/70 | HR 66 | Ht 64.0 in | Wt 175.0 lb

## 2022-10-27 DIAGNOSIS — D329 Benign neoplasm of meninges, unspecified: Secondary | ICD-10-CM

## 2022-10-27 DIAGNOSIS — G6289 Other specified polyneuropathies: Secondary | ICD-10-CM

## 2022-10-27 NOTE — Telephone Encounter (Signed)
[employer] medicare NPR sent to [facility] [phone]

## 2022-10-27 NOTE — Progress Notes (Unsigned)
[facility]  PATIENT: [name] DOB: Jun 22, 1951  REQUESTING CLINICIAN: [facility] HISTORY FROM: Patient  REASON FOR VISIT: Abnormal MRI Results    HISTORICAL  CHIEF COMPLAINT:  Chief Complaint  Patient presents with   Follow-up    Rm 14. Accompanied by husband. Patient here to discuss MRI. C/o dizziness.   INTERVAL HISTORY 10/27/2022: [name] presents today for follow-up.  She is accompanied by her husband.  Last visit was in May 2023.  Since then she has been doing extremely well.  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  Her peripheral edema improved, also her neuropathy is resolved, she is not on the Gabapentin anymore. Currently she is walking without any assistive assistive device. In terms of the meningioma, she denies any headaches, denies any focal neurological deficit but does report on occasion she has left facial numbness.  Again the meningioma is located in the left posterior parafalcine region.  She denies any recent falls.   INTERVAL HISTORY 01/22/2022:  Patient presents today for follow-up for peripheral neuropathy.  Since last visit she did see many doctors and was finally diagnosed with low albumin that is causing her edema.  She said for the past year she has been dealing with edema in the bilateral lower extremities, think it was lymphedema because it affected the lower extremities, she was wrapping her legs but her symptoms were not improving. Finally when she was diagnosed with low albumin and started getting replacement therapy she noticed improvement of the edema.  She still having edema in the lower extremities but improved.  She has been complaining of numbness and tingling that started when the edema of the lower extremity started, she felt the numbness was due to the swelling.  But after improvement if the swelling in the lower  extremities, the numbness and tingling and pain is still present.  She feels most of the pain at night.  She has started gabapentin 600 mg TID with some relief.  She is presenting today hoping to see if there is anything else that can be done to help with the numbness and tingling.    HISTORY OF PRESENT ILLNESS:  This is a 72 year old woman past medical history of obesity status post gastric bypass, acquired lymphedema, dizziness and dyspnea who is presenting after an abnormal MRI.  Patient reports history of dizziness, intermittent for the past year and during this work-up, she had a MRI brain which showed a 6 mm parafalcine meningioma, and she was referred to neurology for further work-up.  In terms of the dizziness she reported previous history of dizziness, was diagnosed with a semicircular canal dehiscence which required surgery.  After the surgery she was doing well and until about a year ago when she is having intermittent dizziness.  She did follow-up with the ENT who ordered the MRI and ENT was planning to order a temporal bone CT scan if the MRI is unrevealing.  Overall she feels like her health is declining, she has kidney cyst, liver issue and now she reports fluid in the abdomen and lungs and worsening left hearing loss.  She also has acquired lymphedema in the lower extremity that limits her mobility.  She denies any headaches, denies any change in vision and no focal weakness.      OTHER MEDICAL CONDITIONS: Obesity status post gastric bypass surgery, acquired lymphedema due to low albumin, dizziness, and dyspnea.  REVIEW OF SYSTEMS: Full 14 system review of systems performed and negative with exception of: As noted in the HPI.  ALLERGIES: Allergies  Allergen Reactions   Zoledronic Acid Other (See Comments)    Other reaction(s): fever and flu-like symptoms   Azithromycin Diarrhea    Other reaction(s): diarrhea   Milk (Cow) Diarrhea   Prednisone Diarrhea   Sulfa Antibiotics  Itching   Tape Dermatitis    HOME MEDICATIONS: Outpatient Medications Prior to Visit  Medication Sig Dispense Refill   albuterol (VENTOLIN HFA) 108 (90 Base) MCG/ACT inhaler      Ascorbic Acid (VITAMIN C PO) Take 2,000 Units by mouth daily.     calcitRIOL (ROCALTROL) 0.5 MCG capsule Take 0.5 mcg by mouth daily.  5   Cholecalciferol ([employer] D3) 125 MCG (5000 UT) capsule Take 1 tablet by mouth daily.     denosumab (PROLIA) 60 MG/ML SOSY injection Inject 60 mg into the skin every 6 (six) months.     fluticasone (FLONASE) 50 MCG/ACT nasal spray Place into both nostrils.     levothyroxine (SYNTHROID) 25 MCG tablet Take 25 mcg by mouth every morning.     Melatonin 10 MG TABS Take 1 tablet by mouth as needed.     Multiple Vitamin (M.V.I. ADULT IV) Take 1 tablet by mouth daily.     pantoprazole (PROTONIX) 20 MG tablet TAKE 1 TABLET BY MOUTH EVERY DAY 90 tablet 3   Vitamin A 2400 MCG (8000 UT) CAPS Take 1 capsule by mouth daily.     Vitamin E 180 MG (400 UNIT) CAPS Take 1 capsule by mouth daily.     gabapentin (NEURONTIN) 300 MG capsule Take 2 capsules (600 mg total) by mouth 3 (three) times daily. 180 capsule 3   Facility-Administered Medications Prior to Visit  Medication Dose Route Frequency Provider Last Rate Last Admin   albumin human 25 % solution 25 g  25 g Intravenous Once Jonathon Bellows, MD        PAST MEDICAL HISTORY: Past Medical History:  Diagnosis Date   Asthma    CHF (congestive heart failure) (HCC)    GERD (gastroesophageal reflux disease)    Hypertension    Internal hemorrhoids    Iron deficiency anemia    Osteoporosis    Peptic ulcer    Tubular adenoma of colon    Vertigo     PAST SURGICAL HISTORY: Past Surgical History:  Procedure Laterality Date   BACK SURGERY     BREAST SURGERY     CHOLECYSTECTOMY     COLONOSCOPY WITH PROPOFOL N/A 06/02/2017   Procedure: COLONOSCOPY WITH PROPOFOL;  Surgeon: Jonathon Bellows, MD;  Location: [facility] ENDOSCOPY;  Service: Gastroenterology;   Laterality: N/A;   COLONOSCOPY WITH PROPOFOL N/A 01/28/2021   Procedure: COLONOSCOPY WITH PROPOFOL;  Surgeon: Jonathon Bellows, MD;  Location: [facility] ENDOSCOPY;  Service: Gastroenterology;  Laterality: N/A;   COLONOSCOPY WITH PROPOFOL N/A 12/12/2021   Procedure: COLONOSCOPY WITH PROPOFOL;  Surgeon: Jonathon Bellows, MD;  Location: [facility] ENDOSCOPY;  Service: Gastroenterology;  Laterality: N/A;  Patient is requesting to be last since she lives in [place].   ESOPHAGOGASTRODUODENOSCOPY (EGD) WITH PROPOFOL N/A 07/01/2017   Procedure: ESOPHAGOGASTRODUODENOSCOPY (EGD) WITH PROPOFOL;  Surgeon: Jonathon Bellows, MD;  Location: [facility] ENDOSCOPY;  Service: Gastroenterology;  Laterality: N/A;   ESOPHAGOGASTRODUODENOSCOPY (EGD) WITH PROPOFOL N/A 01/28/2021   Procedure: ESOPHAGOGASTRODUODENOSCOPY (EGD) WITH PROPOFOL;  Surgeon: Jonathon Bellows, MD;  Location: [facility] ENDOSCOPY;  Service: Gastroenterology;  Laterality: N/A;   ESOPHAGOGASTRODUODENOSCOPY (EGD) WITH PROPOFOL N/A 12/12/2021  Procedure: ESOPHAGOGASTRODUODENOSCOPY (EGD) WITH PROPOFOL;  Surgeon: Jonathon Bellows, MD;  Location: [facility] ENDOSCOPY;  Service: Gastroenterology;  Laterality: N/A;   FLEXIBLE SIGMOIDOSCOPY N/A 07/01/2017   Procedure: FLEXIBLE SIGMOIDOSCOPY;  Surgeon: Jonathon Bellows, MD;  Location: [facility] ENDOSCOPY;  Service: Gastroenterology;  Laterality: N/A;   KNEE SURGERY     mini gastric bypass     SHOULDER SURGERY     TONSILLECTOMY      FAMILY HISTORY: Family History  Problem Relation Age of Onset   Breast cancer Paternal Grandmother    Diabetes Father    Heart disease Father    Colon cancer Father    Brain cancer Mother    Healthy Daughter    Hypertension Other    Stroke Other    Diabetes Other    Heart attack Other    Obesity Other    Stomach cancer Neg Hx    Pancreatic cancer Neg Hx     SOCIAL HISTORY: Social History   Socioeconomic History   Marital status: Married    Spouse name: Not on file   Number of children: Not on file   Years of education: Not on  file   Highest education level: Not on file  Occupational History   Not on file  Tobacco Use   Smoking status: Never   Smokeless tobacco: Never  Vaping Use   Vaping Use: Never used  Substance and Sexual Activity   Alcohol use: No    Alcohol/week: 0.0 standard drinks of alcohol   Drug use: Never   Sexual activity: Yes  Other Topics Concern   Not on file  Social History Narrative   Not on file   Social Determinants of Health   Financial Resource Strain: Low Risk  (07/13/2022)   Overall Financial Resource Strain (CARDIA)    Difficulty of Paying Living Expenses: Not hard at all  Food Insecurity: No Food Insecurity (07/13/2022)   Hunger Vital Sign    Worried About Running Out of Food in the Last Year: Never true    Ran Out of Food in the Last Year: Never true  Transportation Needs: No Transportation Needs (07/13/2022)   PRAPARE - Hydrologist (Medical): No    Lack of Transportation (Non-Medical): No  Physical Activity: Inactive (07/13/2022)   Exercise Vital Sign    Days of Exercise per Week: 0 days    Minutes of Exercise per Session: 0 min  Stress: No Stress Concern Present (07/13/2022)   [place]    Feeling of Stress : Only a little  Social Connections: Socially Integrated (07/13/2022)   Social Connection and Isolation Panel [NHANES]    Frequency of Communication with Friends and Family: More than three times a week    Frequency of Social Gatherings with Friends and Family: More than three times a week    Attends Religious Services: More than 4 times per year    Active Member of [employer] or Organizations: Yes    Attends Music therapist: More than 4 times per year    Marital Status: Married  Human resources officer Violence: Not At Risk (07/13/2022)   Humiliation, Afraid, Rape, and Kick questionnaire    Fear of Current or Ex-Partner: No    Emotionally Abused: No     Physically Abused: No    Sexually Abused: No    PHYSICAL EXAM  GENERAL EXAM/CONSTITUTIONAL: Vitals:  Vitals:   10/27/22 1344  BP: 104/70  Pulse: 66  Weight:  175 lb (79.4 kg)  Height: 5' 4"$  (1.626 m)    Body mass index is 30.04 kg/m. Wt Readings from Last 3 Encounters:  10/27/22 175 lb (79.4 kg)  07/13/22 179 lb 6.4 oz (81.4 kg)  05/12/22 176 lb 3.2 oz (79.9 kg)   Patient is in no distress; well developed, nourished and groomed; neck is supple  EYES: Visual fields full to confrontation, Extraocular movements intacts,   MUSCULOSKELETAL: Gait, strength, tone, movements noted in Neurologic exam below  NEUROLOGIC: MENTAL STATUS:      No data to display         awake, alert, oriented to person, place and time recent and remote memory intact normal attention and concentration language fluent, comprehension intact, naming intact fund of knowledge appropriate  CRANIAL NERVE:  2nd, 3rd, 4th, 6th - visual fields full to confrontation, extraocular muscles intact, no nystagmus 5th - facial sensation symmetric 7th - facial strength symmetric 8th - hearing intact 9th - palate elevates symmetrically, uvula midline 11th - shoulder shrug symmetric 12th - tongue protrusion midline  MOTOR:  normal bulk and tone, full strength in the BUE, BLE  SENSORY:  normal and symmetric to light touch, and vibration but decrease pinprick up to mid shin   COORDINATION:  finger-nose-finger, fine finger movements normal  REFLEXES:  deep tendon reflexes present and symmetric  GAIT/STATION:  Able to walk without assistance (Previous exam May 2023: Uses a walker but able to walk unassisted)     DIAGNOSTIC DATA (LABS, IMAGING, TESTING) - I reviewed patient records, labs, notes, testing and imaging myself where available.  Lab Results  Component Value Date   WBC 5.6 09/18/2022   HGB 13.6 09/18/2022   HCT 41.6 09/18/2022   MCV 97.0 09/18/2022   PLT 174 09/18/2022       Component Value Date/Time   NA 143 09/03/2022 1110   K 3.8 09/03/2022 1110   CL 108 (H) 09/03/2022 1110   CO2 23 09/03/2022 1110   GLUCOSE 64 (L) 09/03/2022 1110   GLUCOSE 76 04/28/2022 1221   BUN 11 09/03/2022 1110   CREATININE 0.56 (L) 09/03/2022 1110   CALCIUM 8.0 (L) 09/03/2022 1110   PROT 6.0 09/03/2022 1110   ALBUMIN 3.9 09/03/2022 1110   AST 44 (H) 09/03/2022 1110   ALT 60 (H) 09/03/2022 1110   ALKPHOS 56 09/03/2022 1110   BILITOT 0.7 09/03/2022 1110   GFRNONAA >60 04/28/2022 1221   GFRAA >60 05/02/2020 1308   Lab Results  Component Value Date   CHOL 126 02/25/2018   HDL 58 02/25/2018   LDLCALC 61 02/25/2018   TRIG 44 12/17/2021   CHOLHDL 2.2 02/25/2018   Lab Results  Component Value Date   HGBA1C 4.1 (L) 02/25/2018   Lab Results  Component Value Date   VITAMINB12 603 09/18/2022   Lab Results  Component Value Date   TSH 3.090 09/03/2022    MRI Brain 05/13/2021 1.  No acute intracranial pathology identified.  2.  6 mm left posterior parafalcine meningioma.  3.  Minimal chronic small vessel ischemic change.    ASSESSMENT AND PLAN  72 y.o. 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  Since treating her malabsorption syndrome, her lymphedema improved and her neuropathy resolved.  She is no longer taking the gabapentin.  She is  also able to ambulate without any assistive devices. In term of the meningioma, we will obtain a MRI brain for follow-up.  I will contact her to go over the result.  I will see her in the office in 1 year for follow-up.  Advised patient to contact me if she develops any new symptoms such as headache, change in vision, any focal deficit.  She voiced understanding.    1. Meningioma ([place])   2.  Other polyneuropathy      Patient Instructions  MRI Brain with and without contrast  Continue to follow up with PCP  Return in a year    Orders Placed This Encounter  Procedures   MR BRAIN W WO CONTRAST    No orders of the defined types were placed in this encounter.   Return in about 1 year (around 10/28/2023).  I have spent a total of 32 minutes dedicated to this patient today, preparing to see patient, performing a medically appropriate examination and evaluation, ordering tests and/or medications and procedures, and counseling and educating the patient/family/caregiver; independently interpreting result and communicating results to the family/patient/caregiver; and documenting clinical information in the electronic medical record.   Alric Ran, MD 10/28/2022, 8:21 AM  [facility] [address], [place], [place] [address] [phone]

## 2022-10-28 NOTE — Patient Instructions (Signed)
MRI Brain with and without contrast  Continue to follow up with PCP  Return in a year

## 2022-11-23 ENCOUNTER — Ambulatory Visit (INDEPENDENT_AMBULATORY_CARE_PROVIDER_SITE_OTHER): Payer: Medicare Other | Admitting: Cardiology

## 2022-11-23 ENCOUNTER — Encounter: Payer: Self-pay | Admitting: Cardiology

## 2022-11-23 VITALS — BP 120/82 | HR 60 | Ht 64.0 in | Wt 174.0 lb

## 2022-11-23 DIAGNOSIS — R002 Palpitations: Secondary | ICD-10-CM | POA: Diagnosis not present

## 2022-11-23 DIAGNOSIS — R600 Localized edema: Secondary | ICD-10-CM | POA: Diagnosis not present

## 2022-11-23 NOTE — Patient Instructions (Signed)

## 2022-11-23 NOTE — Progress Notes (Signed)
Clinical Summary [name] is a 72 y.o.female seen today for follow up of the following medical problems.    1.Palpitations - prior monitors have shown PACs and atrial runs -08/2021 monitor occasional PACs, frequent runs of SVT up to 29 seconds. Two runs of NSVT longest 8 beats. Symptoms correalted with PACs and SVT.      - no recent symptoms.   2. Chest pain - 07/2021 nuclear stress: no ischemia - chest pains have resolved   ER visit 12/08/21 with negative workup for ACS -- no recent chest pains   3. SOB/Edema - ongoign eval by pumlonary - benign stress test - [facility] echo low normal to mildly decreased LVEF [id number], grade I diastolic dysfunction. She is on oral lasix. Volme status difficult to assess due to body habitus.  - could be related to her PACs and PSVT   -orthostatic symptoms and fall on higher diuretic dosing, see 11/26/21 phone call  11/2021 pro BNP  --->Pro BNP 1025 --> 383 - - issues with hypothyroidism - albumin 2.2, from notes thought that liver disease is not cause. 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 Taking protein shakes every other day. - weight stable around 174 lbs    5. Pleural effusion - noted 08/2021 xray by pulmonary    6. Low bp's - did not start midodrine, bp's have improved as fluid status leveled off   Past Medical History:  Diagnosis Date   Asthma    CHF (congestive heart failure) (HCC)    GERD (gastroesophageal reflux disease)    Hypertension    Internal hemorrhoids    Iron deficiency anemia    Osteoporosis    Peptic ulcer    Tubular adenoma of colon    Vertigo      Allergies  Allergen Reactions   Zoledronic Acid Other (See Comments)    Other reaction(s): fever and flu-like symptoms   Azithromycin Diarrhea    Other reaction(s): diarrhea   Milk (Cow) Diarrhea   Prednisone Diarrhea   Sulfa Antibiotics Itching   Tape Dermatitis     Current Outpatient Medications   Medication Sig Dispense Refill   albuterol (VENTOLIN HFA) 108 (90 Base) MCG/ACT inhaler      Ascorbic Acid (VITAMIN C PO) Take 2,000 Units by mouth daily.     calcitRIOL (ROCALTROL) 0.5 MCG capsule Take 0.5 mcg by mouth daily.  5   Cholecalciferol ([employer] D3) 125 MCG (5000 UT) capsule Take 1 tablet by mouth daily.     denosumab (PROLIA) 60 MG/ML SOSY injection Inject 60 mg into the skin every 6 (six) months.     fluticasone (FLONASE) 50 MCG/ACT nasal spray Place into both nostrils.     levothyroxine (SYNTHROID) 25 MCG tablet Take 25 mcg by mouth every morning.     Melatonin 10 MG TABS Take 1 tablet by mouth as needed.     Multiple Vitamin (M.V.I. ADULT IV) Take 1 tablet by mouth daily.     pantoprazole (PROTONIX) 20 MG tablet TAKE 1 TABLET BY MOUTH EVERY DAY 90 tablet 3   Vitamin A 2400 MCG (8000 UT) CAPS Take 1 capsule by mouth daily.     Vitamin E 180 MG (400 UNIT) CAPS Take 1 capsule by mouth daily.     Current Facility-Administered Medications  Medication Dose Route Frequency Provider Last Rate Last Admin   albumin human 25 % solution 25 g  25 g Intravenous Once Vicente Males,  Bailey Mech, MD         Past Surgical History:  Procedure Laterality Date   BACK SURGERY     BREAST SURGERY     CHOLECYSTECTOMY     COLONOSCOPY WITH PROPOFOL N/A 06/02/2017   Procedure: COLONOSCOPY WITH PROPOFOL;  Surgeon: Jonathon Bellows, MD;  Location: [facility] ENDOSCOPY;  Service: Gastroenterology;  Laterality: N/A;   COLONOSCOPY WITH PROPOFOL N/A 01/28/2021   Procedure: COLONOSCOPY WITH PROPOFOL;  Surgeon: Jonathon Bellows, MD;  Location: [facility] ENDOSCOPY;  Service: Gastroenterology;  Laterality: N/A;   COLONOSCOPY WITH PROPOFOL N/A 12/12/2021   Procedure: COLONOSCOPY WITH PROPOFOL;  Surgeon: Jonathon Bellows, MD;  Location: [facility] ENDOSCOPY;  Service: Gastroenterology;  Laterality: N/A;  Patient is requesting to be last since she lives in [place].   ESOPHAGOGASTRODUODENOSCOPY (EGD) WITH PROPOFOL N/A 07/01/2017   Procedure:  ESOPHAGOGASTRODUODENOSCOPY (EGD) WITH PROPOFOL;  Surgeon: Jonathon Bellows, MD;  Location: [facility] ENDOSCOPY;  Service: Gastroenterology;  Laterality: N/A;   ESOPHAGOGASTRODUODENOSCOPY (EGD) WITH PROPOFOL N/A 01/28/2021   Procedure: ESOPHAGOGASTRODUODENOSCOPY (EGD) WITH PROPOFOL;  Surgeon: Jonathon Bellows, MD;  Location: [facility] ENDOSCOPY;  Service: Gastroenterology;  Laterality: N/A;   ESOPHAGOGASTRODUODENOSCOPY (EGD) WITH PROPOFOL N/A 12/12/2021   Procedure: ESOPHAGOGASTRODUODENOSCOPY (EGD) WITH PROPOFOL;  Surgeon: Jonathon Bellows, MD;  Location: [facility] ENDOSCOPY;  Service: Gastroenterology;  Laterality: N/A;   FLEXIBLE SIGMOIDOSCOPY N/A 07/01/2017   Procedure: FLEXIBLE SIGMOIDOSCOPY;  Surgeon: Jonathon Bellows, MD;  Location: [facility] ENDOSCOPY;  Service: Gastroenterology;  Laterality: N/A;   KNEE SURGERY     mini gastric bypass     SHOULDER SURGERY     TONSILLECTOMY       Allergies  Allergen Reactions   Zoledronic Acid Other (See Comments)    Other reaction(s): fever and flu-like symptoms   Azithromycin Diarrhea    Other reaction(s): diarrhea   Milk (Cow) Diarrhea   Prednisone Diarrhea   Sulfa Antibiotics Itching   Tape Dermatitis      Family History  Problem Relation Age of Onset   Breast cancer Paternal Grandmother    Diabetes Father    Heart disease Father    Colon cancer Father    Brain cancer Mother    Healthy Daughter    Hypertension Other    Stroke Other    Diabetes Other    Heart attack Other    Obesity Other    Stomach cancer Neg Hx    Pancreatic cancer Neg Hx      Social History [name] reports that she has never smoked. She has never used smokeless tobacco. [name] reports no history of alcohol use.   Review of Systems CONSTITUTIONAL: No weight loss, fever, chills, weakness or fatigue.  HEENT: Eyes: No visual loss, blurred vision, double vision or yellow sclerae.No hearing loss, sneezing, congestion, runny nose or sore throat.  SKIN: No rash or itching.  CARDIOVASCULAR:  per hpi RESPIRATORY: No shortness of breath, cough or sputum.  GASTROINTESTINAL: No anorexia, nausea, vomiting or diarrhea. No abdominal pain or blood.  GENITOURINARY: No burning on urination, no polyuria NEUROLOGICAL: No headache, dizziness, syncope, paralysis, ataxia, numbness or tingling in the extremities. No change in bowel or bladder control.  MUSCULOSKELETAL: No muscle, back pain, joint pain or stiffness.  LYMPHATICS: No enlarged nodes. No history of splenectomy.  PSYCHIATRIC: No history of depression or anxiety.  ENDOCRINOLOGIC: No reports of sweating, cold or heat intolerance. No polyuria or polydipsia.  Marland Kitchen   Physical Examination Today's Vitals   11/23/22 1437  BP: 120/82  Pulse: 60  SpO2: 95%  Weight: 174  lb (78.9 kg)  Height: '5\' 4"'$  (1.626 m)   Body mass index is 29.87 kg/m.  Gen: resting comfortably, no acute distress HEENT: no scleral icterus, pupils equal round and reactive, no palptable cervical adenopathy,  CV: RRR, no m/rg, no jvd Resp: Clear to auscultation bilaterally GI: abdomen is soft, non-tender, non-distended, normal bowel sounds, no hepatosplenomegaly MSK: extremities are warm, no edema.  Skin: warm, no rash Neuro:  no focal deficits Psych: appropriate affect   Diagnostic Studies   08/2021 heart monitor 10 day monitor Occasional supraventricular ectopy in the form of isolated PACs, rare couplets and triplets. Frequent runs of SVT longest 29.2 seconds. Rare ventricular ectopy in the form of isolated PVCs, couplets. 2 runs of NSVT longest 8 beats. Reported symptoms correlated with sinus rhythm with PACs and short runs of SVT.     07/2021 nuclear stress   The study is normal. The study is low risk.   No ST deviation was noted.   LV perfusion is normal.   Left ventricular function is normal. End diastolic cavity size is normal.   07/2021 [facility] echo LVEF 45-50%, grade I dd, mild MR, mild AI  Assessment and Plan   1.SOB/LE edema -  after extensive cardiac and lymphedema workup primary issues appears to be low albumin. 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 Palpitations - SVT on prior monitors -no recent symptoms, continue to monitor   F/u 1 year           Arnoldo Lenis, M.D.

## 2022-11-24 ENCOUNTER — Ambulatory Visit (HOSPITAL_COMMUNITY)
Admission: RE | Admit: 2022-11-24 | Discharge: 2022-11-24 | Disposition: A | Discharge status: Home / Self Care | Payer: Medicare Other | Source: Ambulatory Visit | Attending: Neurology | Admitting: Neurology

## 2022-11-24 DIAGNOSIS — R600 Localized edema: Secondary | ICD-10-CM | POA: Insufficient documentation

## 2022-11-24 DIAGNOSIS — D329 Benign neoplasm of meninges, unspecified: Secondary | ICD-10-CM

## 2022-11-24 DIAGNOSIS — R002 Palpitations: Secondary | ICD-10-CM | POA: Diagnosis present

## 2022-11-24 MED ORDER — GADOBUTROL 1 MMOL/ML IV SOLN
7.0000 mL | Freq: Once | INTRAVENOUS | Status: AC | PRN
  Administered 2022-11-24: 7 mL via INTRAVENOUS

## 2022-11-27 ENCOUNTER — Encounter: Payer: Self-pay | Admitting: Neurology

## 2022-11-27 IMAGING — DX DG CHEST 1V
1 series · 1 of 1 positions shown · non-contrast
Comparison: 12/02/2021

CLINICAL DATA: Post RIGHT thoracentesis

EXAM:
CHEST  1 VIEW

[chest pa]
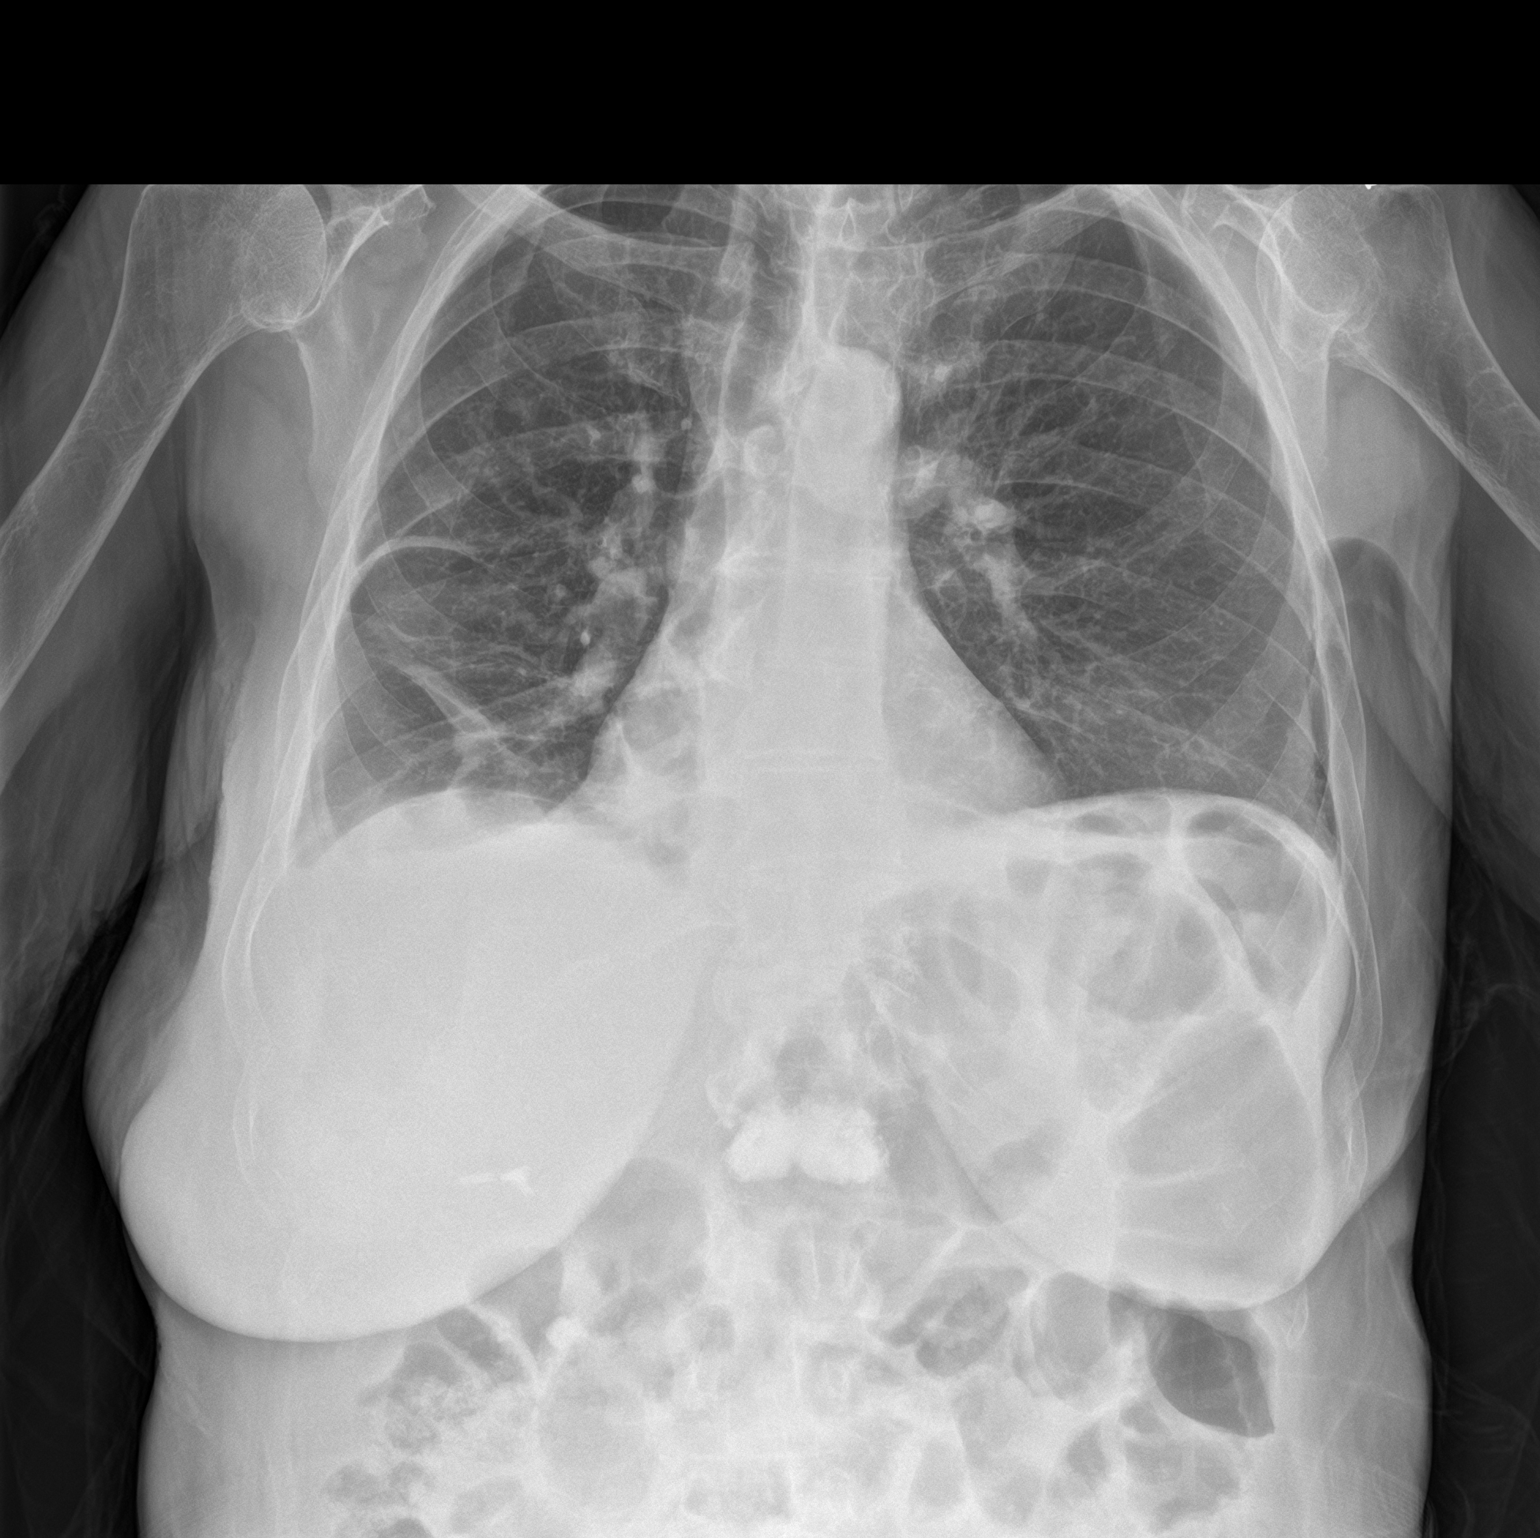

[1 of 1 positions shown; findings below may reference images not displayed]

FINDINGS: Normal heart size, mediastinal contours, and pulmonary vascularity.

Decreased RIGHT pleural effusion and basilar atelectasis.

No pneumothorax.

Minimal chronic central peribronchial thickening.

Remaining lungs clear.

Bones demineralized.
IMPRESSION: No pneumothorax following RIGHT thoracentesis.

## 2022-11-27 IMAGING — US US THORACENTESIS ASP PLEURAL SPACE W/IMG GUIDE
1 series · 6 of 6 positions shown · non-contrast
Comparison: none

INDICATION: Recurrent right pleural effusion

[Series 1: us thoracentesis asp pleural space w/img guide · 6 of 6 slices shown]
[im 1/6]
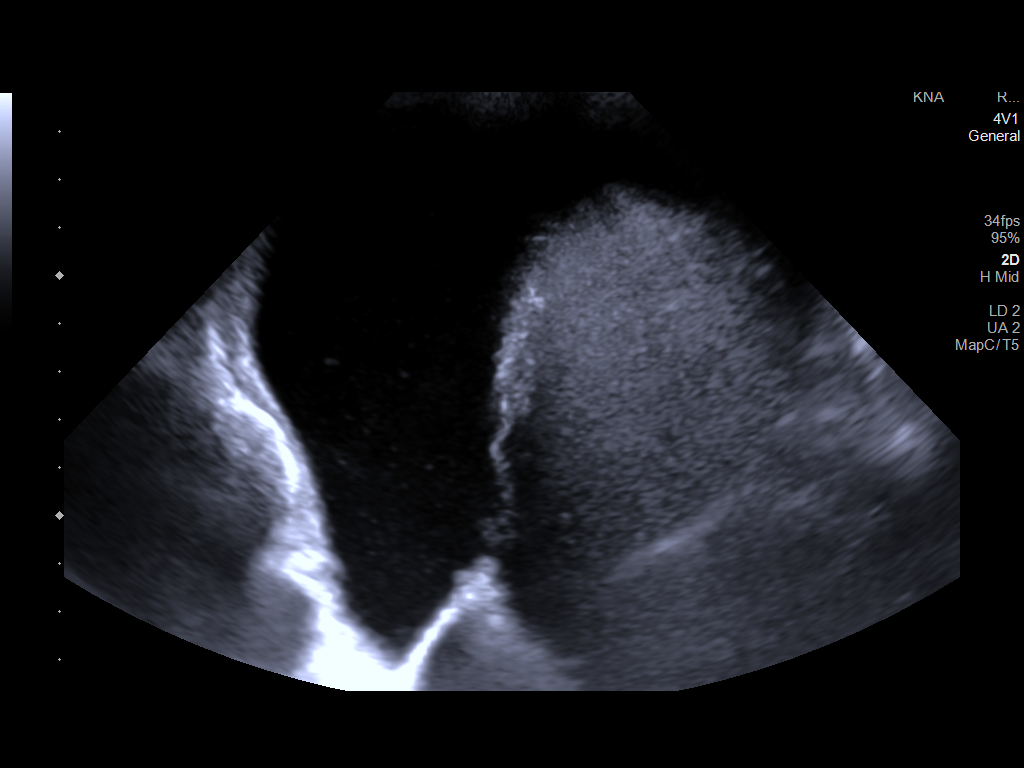
[im 2/6]
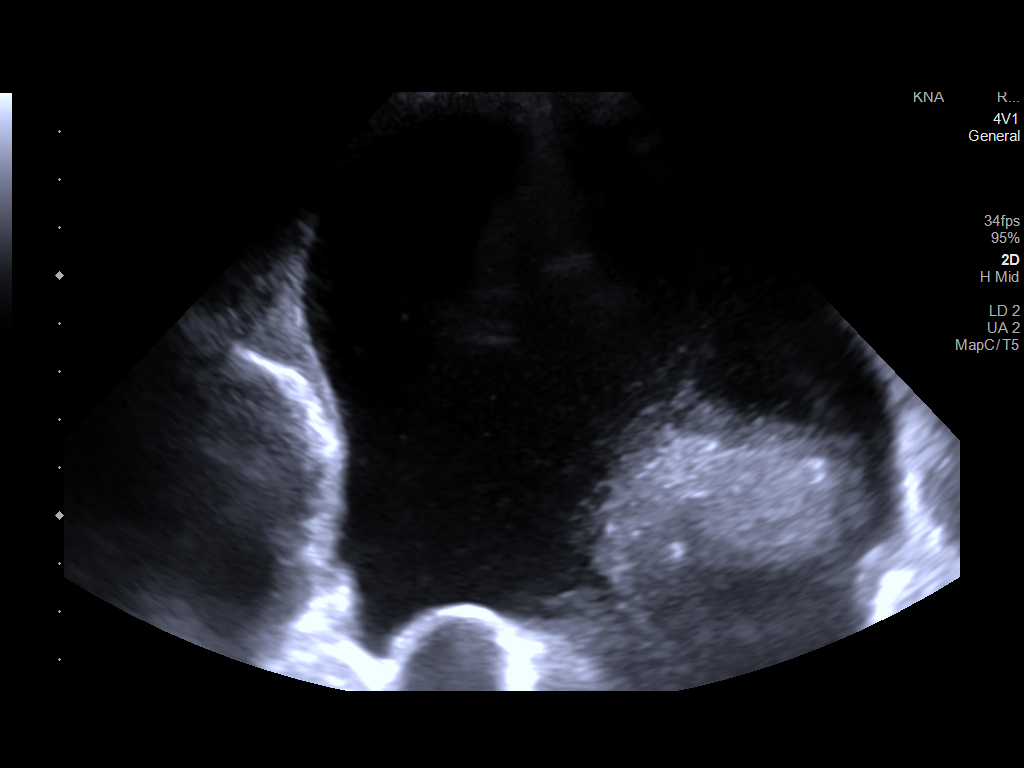
[im 3/6]
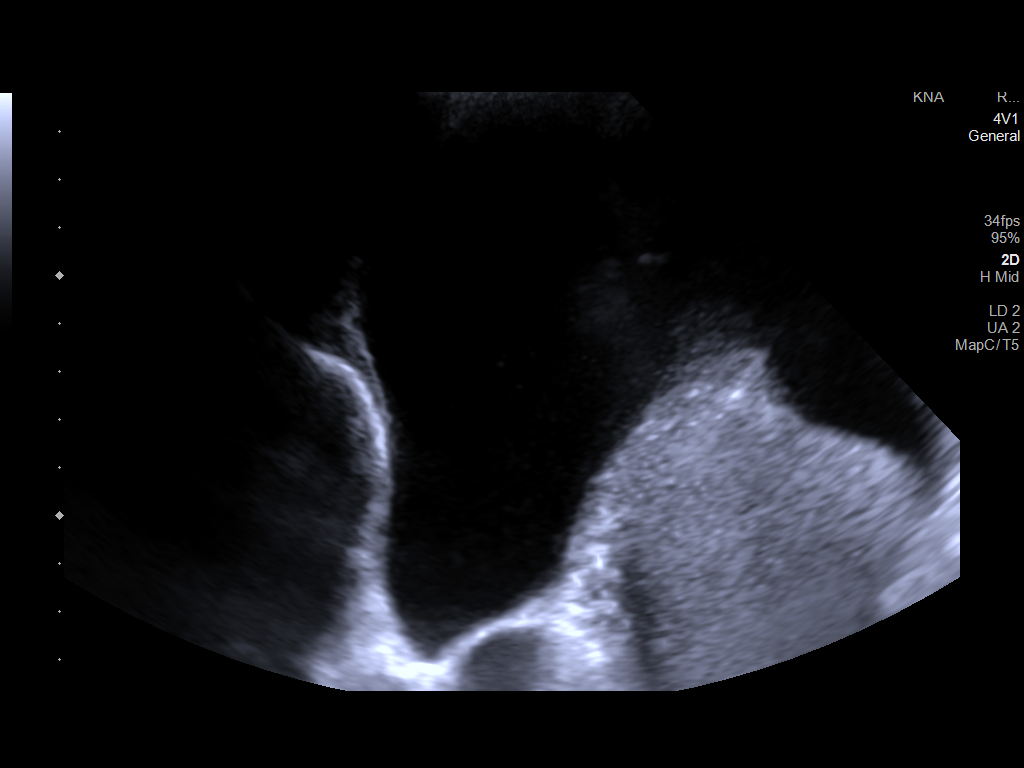
[im 4/6]
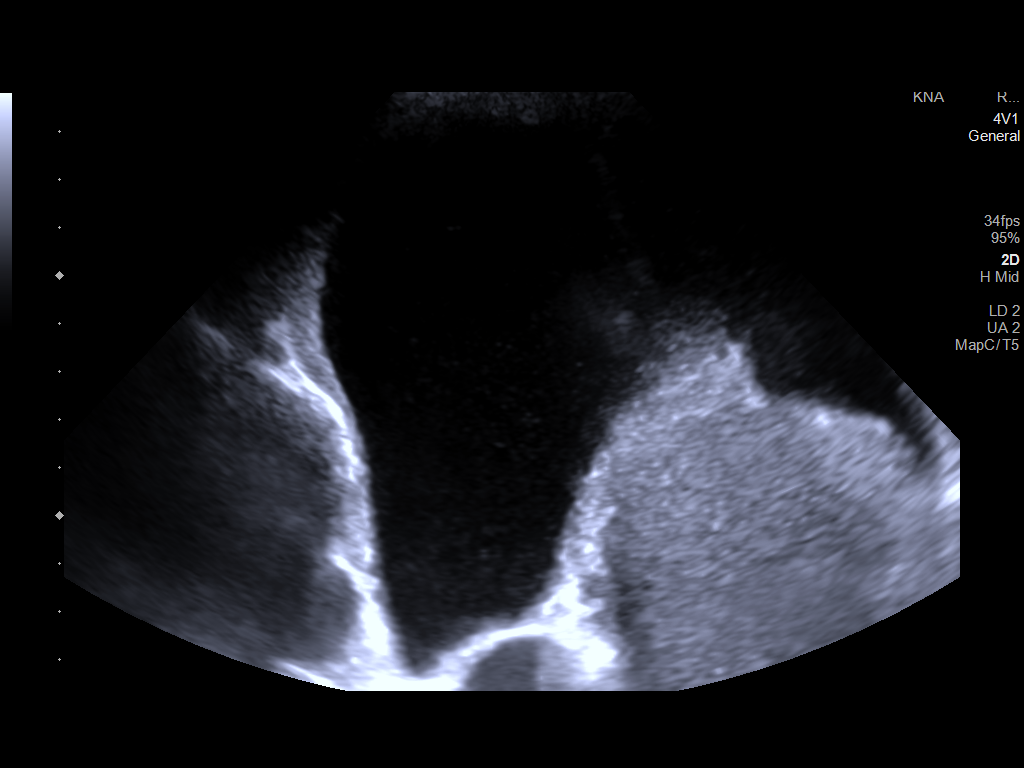
[im 5/6]
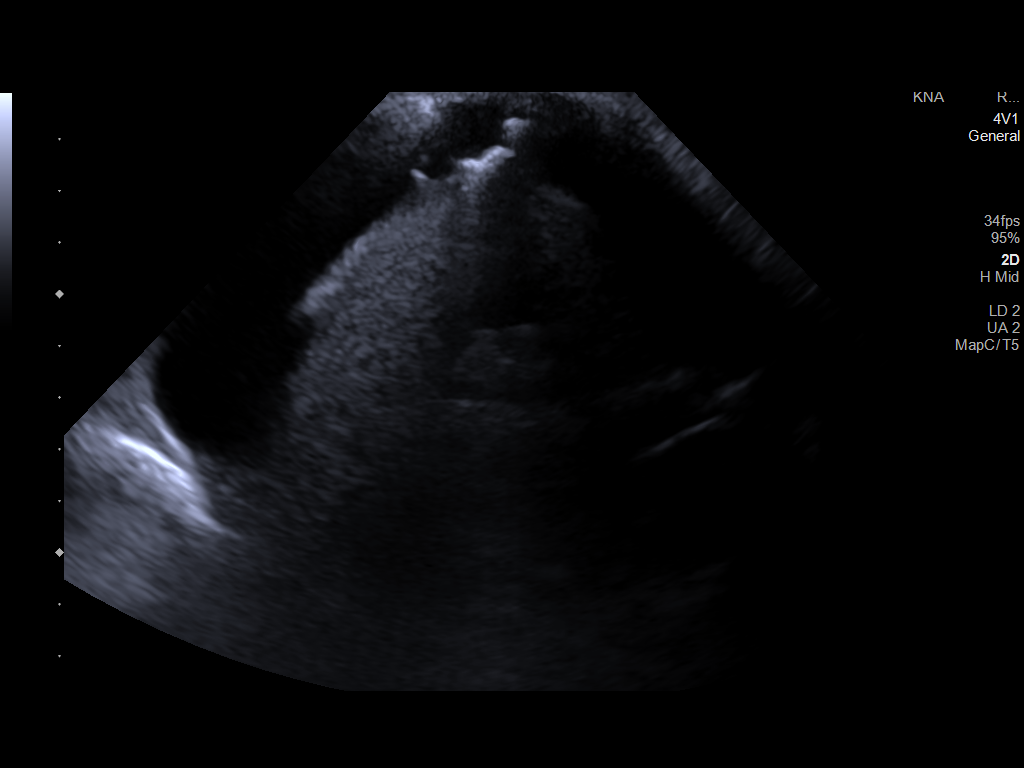
[im 6/6]
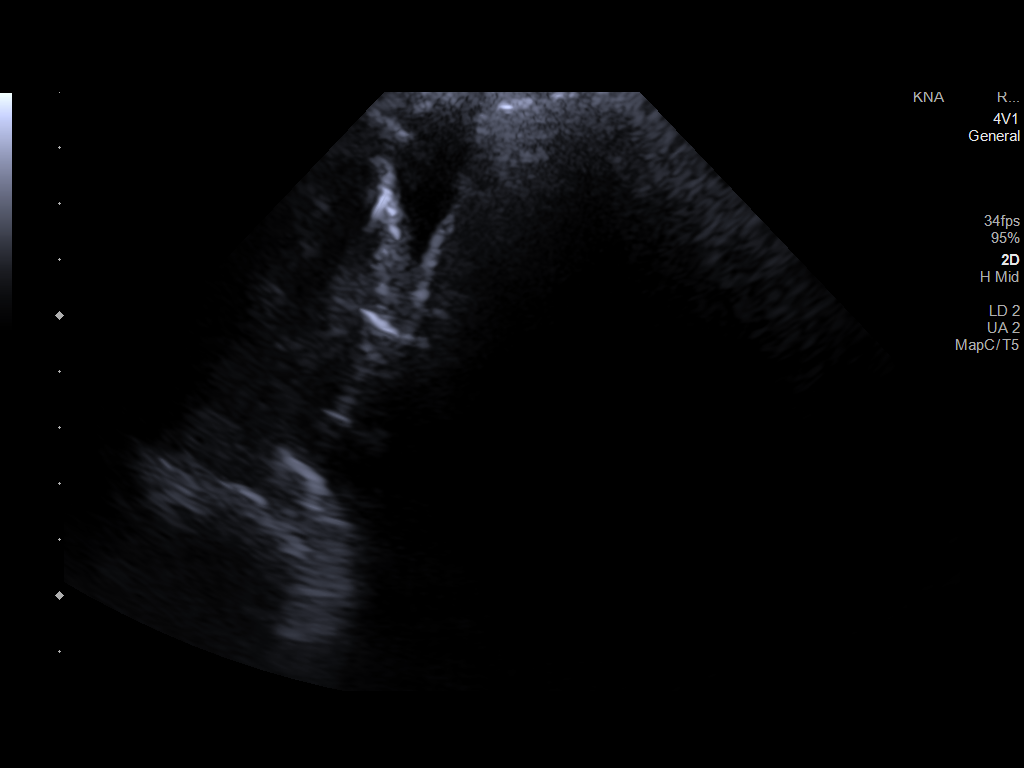

[6 of 6 positions shown; findings below may reference images not displayed]

EXAM:
ULTRASOUND GUIDED RIGHT THORACENTESIS

MEDICATIONS:
20 cc 1% lidocaine.

COMPLICATIONS:
None immediate.

PROCEDURE:
An ultrasound guided thoracentesis was thoroughly discussed with the
patient and questions answered. The benefits, risks, alternatives
and complications were also discussed. The patient understands and
wishes to proceed with the procedure. Written consent was obtained.

Ultrasound was performed to localize and mark an adequate pocket of
fluid in the right chest. The area was then prepped and draped in
the normal sterile fashion. 1% Lidocaine was used for local
anesthesia. Under ultrasound guidance a Yueh catheter was
introduced. Thoracentesis was performed. The catheter was removed
and a dressing applied.

First site we prepared- obtained 750 cc yellow fluid-- but a
moderate amount of fluid remained at lower site.

Relocated site to one rib space beneath original site--- using
sterile procedure another Ody was placed and gathered additional
250 cc yellow fluid
FINDINGS: A total of approximately 1 liter of yellow fluid was removed.
IMPRESSION: Successful ultrasound guided right thoracentesis yielding 1 liter
total of pleural fluid.

Post CXR: No PTX per Dr Tammepark

Read by

Malart Despotoski

## 2022-11-30 ENCOUNTER — Encounter: Payer: Self-pay | Admitting: Gastroenterology

## 2022-12-01 ENCOUNTER — Other Ambulatory Visit: Payer: Self-pay

## 2022-12-01 DIAGNOSIS — E43 Unspecified severe protein-calorie malnutrition: Secondary | ICD-10-CM

## 2022-12-01 DIAGNOSIS — R7989 Other specified abnormal findings of blood chemistry: Secondary | ICD-10-CM

## 2022-12-01 DIAGNOSIS — Z9884 Bariatric surgery status: Secondary | ICD-10-CM

## 2022-12-08 ENCOUNTER — Telehealth: Payer: Self-pay

## 2022-12-08 NOTE — Telephone Encounter (Signed)
-----   Message from Jonathon Bellows, MD sent at 12/08/2022 11:33 AM EDT ----- Infant mom labs are stable total protein and albumin are a touch low which there is nothing much we need to do right now continue the good diet liver function tests are completely returned to normal.  Recheck labs in 3 months continue to consume prot ein shakes and supplements and a diet rich in protein

## 2022-12-08 NOTE — Telephone Encounter (Signed)
Called and left a message for call back  

## 2022-12-08 NOTE — Progress Notes (Signed)
Infant mom labs are stable total protein and albumin are a touch low which there is nothing much we need to do right now continue the good diet liver function tests are completely returned to normal.  Recheck labs in 3 months continue to consume protein shakes and supplements and a diet rich in protein

## 2022-12-09 NOTE — Telephone Encounter (Signed)
Called patient but left her a voicemail letting her know that I would be sending her a patient message and that if she had further questions to please give [place] a call.

## 2022-12-11 LAB — COMPREHENSIVE METABOLIC PANEL
ALT: 17 IU/L (ref 0–32)
AST: 18 IU/L (ref 0–40)
Albumin/Globulin Ratio: 2 (ref 1.2–2.2)
Albumin: 3.6 g/dL — ABNORMAL LOW (ref 3.9–4.9)
Alkaline Phosphatase: 66 IU/L (ref 44–121)
BUN/Creatinine Ratio: 17 (ref 12–28)
BUN: 10 mg/dL (ref 8–27)
Bilirubin Total: 0.4 mg/dL (ref 0.0–1.2)
CO2: 24 mmol/L (ref 20–29)
Calcium: 8.6 mg/dL — ABNORMAL LOW (ref 8.7–10.3)
Chloride: 106 mmol/L (ref 96–106)
Creatinine, Ser: 0.58 mg/dL (ref 0.57–1.00)
Globulin, Total: 1.8 g/dL (ref 1.5–4.5)
Glucose: 77 mg/dL (ref 70–99)
Potassium: 3.7 mmol/L (ref 3.5–5.2)
Sodium: 144 mmol/L (ref 134–144)
Total Protein: 5.4 g/dL — ABNORMAL LOW (ref 6.0–8.5)
eGFR: 98 mL/min/{1.73_m2} (ref >59)

## 2022-12-11 LAB — CBC WITH DIFFERENTIAL/PLATELET
Basophils Absolute: 0 10*3/uL (ref 0.0–0.2)
Basos: 1 %
EOS (ABSOLUTE): 0.1 10*3/uL (ref 0.0–0.4)
Eos: 2 %
Hematocrit: 37.8 % (ref 34.0–46.6)
Hemoglobin: 12.8 g/dL (ref 11.1–15.9)
Immature Grans (Abs): 0 10*3/uL (ref 0.0–0.1)
Immature Granulocytes: 0 %
Lymphocytes Absolute: 1.5 10*3/uL (ref 0.7–3.1)
Lymphs: 23 %
MCH: 32.6 pg (ref 26.6–33.0)
MCHC: 33.9 g/dL (ref 31.5–35.7)
MCV: 96 fL (ref 79–97)
Monocytes Absolute: 0.5 10*3/uL (ref 0.1–0.9)
Monocytes: 8 %
Neutrophils Absolute: 4.2 10*3/uL (ref 1.4–7.0)
Neutrophils: 66 %
Platelets: 218 10*3/uL (ref 150–450)
RBC: 3.93 x10E6/uL (ref 3.77–5.28)
RDW: 12 % (ref 11.7–15.4)
WBC: 6.3 10*3/uL (ref 3.4–10.8)

## 2022-12-11 LAB — PREALBUMIN: PREALBUMIN: 20 mg/dL (ref 10–36)

## 2022-12-11 LAB — TSH: TSH: 2.47 u[IU]/mL (ref 0.450–4.500)

## 2022-12-11 LAB — VITAMIN C: Vitamin C: 0.9 mg/dL (ref 0.4–2.0)

## 2023-02-01 ENCOUNTER — Other Ambulatory Visit: Payer: Self-pay

## 2023-02-01 ENCOUNTER — Encounter: Payer: Self-pay | Admitting: Gastroenterology

## 2023-02-01 DIAGNOSIS — E43 Unspecified severe protein-calorie malnutrition: Secondary | ICD-10-CM

## 2023-02-01 DIAGNOSIS — R7989 Other specified abnormal findings of blood chemistry: Secondary | ICD-10-CM

## 2023-02-01 NOTE — Telephone Encounter (Signed)
Cbc,cmp,TSH please - can she provide a weight chart

## 2023-02-03 LAB — COMPREHENSIVE METABOLIC PANEL
ALT: 38 IU/L — ABNORMAL HIGH (ref 0–32)
AST: 42 IU/L — ABNORMAL HIGH (ref 0–40)
Albumin/Globulin Ratio: 1.5 (ref 1.2–2.2)
Albumin: 2.9 g/dL — ABNORMAL LOW (ref 3.9–4.9)
Alkaline Phosphatase: 88 IU/L (ref 44–121)
BUN/Creatinine Ratio: 35 — ABNORMAL HIGH (ref 12–28)
BUN: 19 mg/dL (ref 8–27)
Bilirubin Total: 0.5 mg/dL (ref 0.0–1.2)
CO2: 24 mmol/L (ref 20–29)
Calcium: 7.7 mg/dL — ABNORMAL LOW (ref 8.7–10.3)
Chloride: 107 mmol/L — ABNORMAL HIGH (ref 96–106)
Creatinine, Ser: 0.54 mg/dL — ABNORMAL LOW (ref 0.57–1.00)
Globulin, Total: 2 g/dL (ref 1.5–4.5)
Glucose: 99 mg/dL (ref 70–99)
Potassium: 4.6 mmol/L (ref 3.5–5.2)
Sodium: 143 mmol/L (ref 134–144)
Total Protein: 4.9 g/dL — ABNORMAL LOW (ref 6.0–8.5)
eGFR: 100 mL/min/{1.73_m2} (ref >59)

## 2023-02-03 LAB — CBC WITH DIFFERENTIAL/PLATELET
Basophils Absolute: 0 10*3/uL (ref 0.0–0.2)
Basos: 0 %
EOS (ABSOLUTE): 0.1 10*3/uL (ref 0.0–0.4)
Eos: 1 %
Hematocrit: 36 % (ref 34.0–46.6)
Hemoglobin: 11.9 g/dL (ref 11.1–15.9)
Immature Grans (Abs): 0 10*3/uL (ref 0.0–0.1)
Immature Granulocytes: 0 %
Lymphocytes Absolute: 1.3 10*3/uL (ref 0.7–3.1)
Lymphs: 24 %
MCH: 31.1 pg (ref 26.6–33.0)
MCHC: 33.1 g/dL (ref 31.5–35.7)
MCV: 94 fL (ref 79–97)
Monocytes Absolute: 0.4 10*3/uL (ref 0.1–0.9)
Monocytes: 8 %
Neutrophils Absolute: 3.6 10*3/uL (ref 1.4–7.0)
Neutrophils: 67 %
Platelets: 203 10*3/uL (ref 150–450)
RBC: 3.83 x10E6/uL (ref 3.77–5.28)
RDW: 12.2 % (ref 11.7–15.4)
WBC: 5.4 10*3/uL (ref 3.4–10.8)

## 2023-02-03 LAB — TSH: TSH: 2.21 u[IU]/mL (ref 0.450–4.500)

## 2023-02-03 NOTE — Progress Notes (Signed)
Can you also enquire if she has been sick recently sometimes the albumin may be low due to acute inflammation

## 2023-02-03 NOTE — Telephone Encounter (Signed)
Do we have the contact of the nutrition person who used to manage her feeds ? I want to discuss with that person the next steps. I can do a video visit next Friday after 2 pm with patient to come up with a plan , hopefully we would have got in touch with the nutrition person before that

## 2023-02-03 NOTE — Progress Notes (Signed)
Albumin levels have dropped. Can we get back to the nutrition person we were linked with who was also following the patient to discuss what we can do next

## 2023-02-04 NOTE — Telephone Encounter (Signed)
My plan to suggest  1. Weights 3 times a week and send me email every 2 weeks how she is doing   2 . CMP, pre albumin [place]   3. Suggest to consume eggs, meat , protein shakes .  4. Ask her if she is having diarrhea - if yes then commence on questran 1 pack once to two times a day   5. Office visit with me when she returns from beach  6. Feel free for her to message [place] from beach for any concerns so we can change plan

## 2023-02-09 ENCOUNTER — Inpatient Hospital Stay: Payer: Medicare Other | Attending: Physician Assistant

## 2023-02-09 DIAGNOSIS — D500 Iron deficiency anemia secondary to blood loss (chronic): Secondary | ICD-10-CM

## 2023-02-09 DIAGNOSIS — D509 Iron deficiency anemia, unspecified: Secondary | ICD-10-CM | POA: Diagnosis present

## 2023-02-09 DIAGNOSIS — K909 Intestinal malabsorption, unspecified: Secondary | ICD-10-CM

## 2023-02-09 DIAGNOSIS — E610 Copper deficiency: Secondary | ICD-10-CM

## 2023-02-09 DIAGNOSIS — E538 Deficiency of other specified B group vitamins: Secondary | ICD-10-CM

## 2023-02-09 LAB — CBC WITH DIFFERENTIAL/PLATELET
Abs Immature Granulocytes: 0.02 10*3/uL (ref 0.00–0.07)
Basophils Absolute: 0 10*3/uL (ref 0.0–0.1)
Basophils Relative: 0 %
Eosinophils Absolute: 0.1 10*3/uL (ref 0.0–0.5)
Eosinophils Relative: 1 %
HCT: 36.4 % (ref 36.0–46.0)
Hemoglobin: 11.6 g/dL — ABNORMAL LOW (ref 12.0–15.0)
Immature Granulocytes: 0 %
Lymphocytes Relative: 26 %
Lymphs Abs: 1.6 10*3/uL (ref 0.7–4.0)
MCH: 32.1 pg (ref 26.0–34.0)
MCHC: 31.9 g/dL (ref 30.0–36.0)
MCV: 100.8 fL — ABNORMAL HIGH (ref 80.0–100.0)
Monocytes Absolute: 0.5 10*3/uL (ref 0.1–1.0)
Monocytes Relative: 8 %
Neutro Abs: 4 10*3/uL (ref 1.7–7.7)
Neutrophils Relative %: 65 %
Platelets: 205 10*3/uL (ref 150–400)
RBC: 3.61 MIL/uL — ABNORMAL LOW (ref 3.87–5.11)
RDW: 14.1 % (ref 11.5–15.5)
WBC: 6.2 10*3/uL (ref 4.0–10.5)
nRBC: 0 % (ref 0.0–0.2)

## 2023-02-09 LAB — IRON AND TIBC
Iron: 95 ug/dL (ref 28–170)
Saturation Ratios: 64 % — ABNORMAL HIGH (ref 10.4–31.8)
TIBC: 149 ug/dL — ABNORMAL LOW (ref 250–450)
UIBC: 54 ug/dL

## 2023-02-09 LAB — FOLATE: Folate: 31.4 ng/mL (ref >5.9)

## 2023-02-09 LAB — VITAMIN B12: Vitamin B-12: 787 pg/mL (ref 180–914)

## 2023-02-09 LAB — FERRITIN: Ferritin: 245 ng/mL (ref 11–307)

## 2023-02-10 ENCOUNTER — Inpatient Hospital Stay: Payer: Medicare Other

## 2023-02-11 LAB — METHYLMALONIC ACID, SERUM: Methylmalonic Acid, Quantitative: 214 nmol/L (ref 0–378)

## 2023-02-12 LAB — COPPER, SERUM: Copper: 62 ug/dL — ABNORMAL LOW (ref 80–158)

## 2023-02-18 ENCOUNTER — Encounter: Payer: Self-pay | Admitting: Gastroenterology

## 2023-02-18 DIAGNOSIS — R7989 Other specified abnormal findings of blood chemistry: Secondary | ICD-10-CM

## 2023-02-18 DIAGNOSIS — M6281 Muscle weakness (generalized): Secondary | ICD-10-CM

## 2023-02-18 DIAGNOSIS — E8809 Other disorders of plasma-protein metabolism, not elsewhere classified: Secondary | ICD-10-CM

## 2023-02-18 DIAGNOSIS — E43 Unspecified severe protein-calorie malnutrition: Secondary | ICD-10-CM

## 2023-02-18 NOTE — Telephone Encounter (Signed)
1.  You are consuming a very good quantity of protein and I am expecting that that probably is the reason that you are doing better with the swelling that is improving.  2.  Stools being orange in color unclear as to the significance it may be some of the additives in the protein shakes.  3.  When you come back into town we should come and pick up some samples of Creon which are pancreatic enzyme which may help with reducing the amount of grease in the stool and with that absorption  4.  I will await your lab results and update you

## 2023-02-18 NOTE — Progress Notes (Signed)
VIRTUAL VISIT via TELEPHONE NOTE [facility]   I connected with [name]  on 02/19/2023 at 9:17 AM by telephone and verified that I am speaking with the correct person using two identifiers.  Location: Patient: Home Provider: [facility]   I discussed the limitations, risks, security and privacy concerns of performing an evaluation and management service by telephone and the availability of in person appointments. I also discussed with the patient that there may be a patient responsible charge related to this service. The patient expressed understanding and agreed to proceed.  REASON FOR VISIT:  Follow-up for iron deficiency anemia   PRIOR THERAPY: None   CURRENT THERAPY: Intermittent IV iron (last Feraheme on 05/12/2022 and 05/25/2022)  INTERVAL HISTORY:  [name] is contacted today for follow-up of her iron deficiency anemia.  She was last evaluated via telemedicine visit by Rojelio Brenner PA-C on 09/25/2022.    At today's visit, she reports feeling fairly well.  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  She was able to come off of TPN in October 2023.   She denies any signs of blood loss such as bright red blood per rectum, melena, epistaxis, or hematuria.   She has some fatigue with energy about 60%, improved but not yet at baseline following IV iron infusion in August/September 2023.  She has episodes of dizziness related to "dehiscence in her ear."  She denies any pica, restless legs, headaches, chest pain, dyspnea on exertion, or syncope.  She stopped taking folic acid and copper June 1610.   REVIEW OF SYSTEMS:  Review of Systems  Constitutional:  Negative for chills, diaphoresis, fever, malaise/fatigue and weight loss.  Respiratory:  Negative for cough and shortness of breath.   Cardiovascular:  Negative for  chest pain and palpitations.  Gastrointestinal:  Positive for nausea. Negative for abdominal pain, blood in stool, melena and vomiting.  Neurological:  Positive for dizziness. Negative for headaches.  Psychiatric/Behavioral:  The patient has insomnia.      PHYSICAL EXAM: (per limitations of virtual telephone visit)  The patient is alert and oriented x 3, exhibiting adequate mentation, good mood, and ability to speak in full sentences and execute sound judgement.  ASSESSMENT & PLAN:  1.  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 - History of anastomotic ulcer many years ago - Episode of hematochezia and fecal occult blood positive in September 2018, required PRBC transfusions - Most recent EGD/colonoscopy (12/12/2021): Normal esophagus.  Roux-en-Y gastrojejunostomy with gastrojejunal anastomosis characterized by congestion, edema, and erythema.  Normal colon. - She receives intermittent IV Feraheme, most recently on 05/25/2022.  She reports that her energy improves after IV iron. - She required hospitalization in March 2023 due to severe nutritional deficiencies, and was on TPN from March 2023 through October 2023 - She stopped taking folic acid and copper in June 2023. - Most recent labs (02/09/2023): Hgb 11.6/MCV 100.8, otherwise normal CBC/differential. Normal B12 and MMA.  Normal folate. Copper is LOW at 62. Iron improved with ferritin 245, iron saturation 64%. - She denies any epistaxis, hematemesis, hematochezia, melena    - Her fatigue has significantly improved, but she tires easily  - PLAN: No indication for IV iron at this time. - Patient  advised to restart her oral copper supplement.    - Repeat labs with follow-up visit in 4 months  PLAN SUMMARY: >> Labs in 4 months (CBC/D, ferritin, iron/TIBC, copper) >>  OFFICE visit 2 weeks after labs     I discussed the assessment and treatment plan with the patient. The patient was provided an opportunity to ask questions and all were answered. The patient agreed with the plan and demonstrated an understanding of the instructions.   The patient was advised to call back or seek an in-person evaluation if the symptoms worsen or if the condition fails to improve as anticipated.  I provided 22 minutes of non-face-to-face time during this encounter.  Carnella Guadalajara, PA-C 02/19/2023 9:38 AM

## 2023-02-19 ENCOUNTER — Encounter: Payer: Self-pay | Admitting: Physician Assistant

## 2023-02-19 ENCOUNTER — Inpatient Hospital Stay: Payer: Medicare Other | Attending: Physician Assistant | Admitting: Physician Assistant

## 2023-02-19 DIAGNOSIS — K909 Intestinal malabsorption, unspecified: Secondary | ICD-10-CM

## 2023-02-19 DIAGNOSIS — D500 Iron deficiency anemia secondary to blood loss (chronic): Secondary | ICD-10-CM

## 2023-02-19 DIAGNOSIS — E610 Copper deficiency: Secondary | ICD-10-CM | POA: Diagnosis not present

## 2023-02-19 NOTE — Patient Instructions (Signed)
[facility] at [facility] **VISIT SUMMARY & IMPORTANT INSTRUCTIONS **   You were evaluated via telephone visit by Rojelio Brenner PA-C. Your labs show mild anemia, with hemoglobin 11.6. Your B12, iron, and folate levels were normal. Your copper levels were low.  Please restart taking copper supplement (2 mg tablet), which is available over-the-counter online and in stores such as [employer]. Take 8 mg copper daily x 2 weeks Then take 6 mg copper x 1 week Then take 4 mg copper x 1 week Then take 2 mg copper daily indefinitely  LABS: Return in 4 months for repeat labs  FOLLOW-UP APPOINTMENT: Office visit in 4 months, after labs.  ** Thank you for trusting me with your healthcare!  I strive to provide all of my patients with quality care at each visit.  If you receive a survey for this visit, I would be so grateful to you for taking the time to provide feedback.  Thank you in advance!  ~ Kelleen Stolze                   Dr. Doreatha Massed   &   Rojelio Brenner, PA-C   - - - - - - - - - - - - - - - - - -    Thank you for choosing [facility] at [facility] to provide your oncology and hematology care.  To afford each patient quality time with our provider, please arrive at least 15 minutes before your scheduled appointment time.   If you have a lab appointment with the Cancer Center please come in thru the Main Entrance and check in at the main information desk.  You need to re-schedule your appointment should you arrive 10 or more minutes late.  We strive to give you quality time with our providers, and arriving late affects you and other patients whose appointments are after yours.  Also, if you no show three or more times for appointments you may be dismissed from the clinic at the providers discretion.     Again, thank you for choosing [facility].  Our hope is that these requests will decrease the amount of time that you wait before  being seen by our physicians.       _____________________________________________________________  Should you have questions after your visit to [facility], please contact our office at [phone] and follow the prompts.  Our office hours are 8:00 a.m. and 4:30 p.m. Monday - Friday.  Please note that voicemails left after 4:00 p.m. may not be returned until the following business day.  We are closed weekends and major holidays.  You do have access to a nurse 24-7, just call the main number to the clinic [phone] and do not press any options, hold on the line and a nurse will answer the phone.    For prescription refill requests, have your pharmacy contact our office and allow 72 hours.

## 2023-02-22 ENCOUNTER — Other Ambulatory Visit: Payer: Self-pay | Admitting: Gastroenterology

## 2023-02-23 NOTE — Progress Notes (Signed)
Albumin looks better- continue present diet , monitor weight and we will recheck labs in 4 weeks

## 2023-02-26 LAB — COMPREHENSIVE METABOLIC PANEL
ALT: 46 IU/L — ABNORMAL HIGH (ref 0–32)
AST: 48 IU/L — ABNORMAL HIGH (ref 0–40)
Albumin/Globulin Ratio: 1.6
Albumin: 3.1 g/dL — ABNORMAL LOW (ref 3.9–4.9)
Alkaline Phosphatase: 86 IU/L (ref 44–121)
BUN/Creatinine Ratio: 32 — ABNORMAL HIGH (ref 12–28)
BUN: 18 mg/dL (ref 8–27)
Bilirubin Total: 0.6 mg/dL (ref 0.0–1.2)
CO2: 23 mmol/L (ref 20–29)
Calcium: 8.3 mg/dL — ABNORMAL LOW (ref 8.7–10.3)
Chloride: 106 mmol/L (ref 96–106)
Creatinine, Ser: 0.56 mg/dL — ABNORMAL LOW (ref 0.57–1.00)
Globulin, Total: 2 g/dL (ref 1.5–4.5)
Glucose: 77 mg/dL (ref 70–99)
Potassium: 5 mmol/L (ref 3.5–5.2)
Sodium: 142 mmol/L (ref 134–144)
Total Protein: 5.1 g/dL — ABNORMAL LOW (ref 6.0–8.5)
eGFR: 99 mL/min/{1.73_m2} (ref >59)

## 2023-02-26 LAB — CBC WITH DIFFERENTIAL/PLATELET
Basophils Absolute: 0 10*3/uL (ref 0.0–0.2)
Basos: 1 %
EOS (ABSOLUTE): 0.1 10*3/uL (ref 0.0–0.4)
Eos: 1 %
Hematocrit: 36.9 % (ref 34.0–46.6)
Hemoglobin: 12.1 g/dL (ref 11.1–15.9)
Immature Grans (Abs): 0 10*3/uL (ref 0.0–0.1)
Immature Granulocytes: 0 %
Lymphocytes Absolute: 1.4 10*3/uL (ref 0.7–3.1)
Lymphs: 25 %
MCH: 32 pg (ref 26.6–33.0)
MCHC: 32.8 g/dL (ref 31.5–35.7)
MCV: 98 fL — ABNORMAL HIGH (ref 79–97)
Monocytes Absolute: 0.5 10*3/uL (ref 0.1–0.9)
Monocytes: 8 %
Neutrophils Absolute: 3.5 10*3/uL (ref 1.4–7.0)
Neutrophils: 65 %
Platelets: 251 10*3/uL (ref 150–450)
RBC: 3.78 x10E6/uL (ref 3.77–5.28)
RDW: 12.8 % (ref 11.7–15.4)
WBC: 5.4 10*3/uL (ref 3.4–10.8)

## 2023-02-26 LAB — TSH: TSH: 3.52 u[IU]/mL (ref 0.450–4.500)

## 2023-02-26 LAB — VITAMIN C: Vitamin C: 1.6 mg/dL (ref 0.4–2.0)

## 2023-02-26 LAB — PREALBUMIN: PREALBUMIN: 13 mg/dL (ref 10–36)

## 2023-03-16 NOTE — Addendum Note (Signed)
Addended by: Adela Ports on: 03/16/2023 09:35 AM   Modules accepted: Orders

## 2023-03-25 NOTE — Progress Notes (Signed)
1. K is high suggest recheck - avoid excess fruit juices and sports drinks 2. Hb and albumin doing well  3. Suggest to stay the course labs looking better

## 2023-03-25 NOTE — Addendum Note (Signed)
Addended by: Adela Ports on: 03/25/2023 10:01 AM   Modules accepted: Orders

## 2023-03-26 ENCOUNTER — Other Ambulatory Visit: Payer: Medicare Other

## 2023-03-27 LAB — CBC WITH DIFFERENTIAL/PLATELET
Basophils Absolute: 0 10*3/uL (ref 0.0–0.2)
Basos: 1 %
EOS (ABSOLUTE): 0 10*3/uL (ref 0.0–0.4)
Eos: 1 %
Hematocrit: 37.8 % (ref 34.0–46.6)
Hemoglobin: 12.5 g/dL (ref 11.1–15.9)
Immature Grans (Abs): 0 10*3/uL (ref 0.0–0.1)
Immature Granulocytes: 0 %
Lymphocytes Absolute: 1.4 10*3/uL (ref 0.7–3.1)
Lymphs: 24 %
MCH: 33 pg (ref 26.6–33.0)
MCHC: 33.1 g/dL (ref 31.5–35.7)
MCV: 100 fL — ABNORMAL HIGH (ref 79–97)
Monocytes Absolute: 0.5 10*3/uL (ref 0.1–0.9)
Monocytes: 9 %
Neutrophils Absolute: 3.7 10*3/uL (ref 1.4–7.0)
Neutrophils: 65 %
Platelets: 237 10*3/uL (ref 150–450)
RBC: 3.79 x10E6/uL (ref 3.77–5.28)
RDW: 11.3 % — ABNORMAL LOW (ref 11.7–15.4)
WBC: 5.7 10*3/uL (ref 3.4–10.8)

## 2023-03-27 LAB — COMPREHENSIVE METABOLIC PANEL
ALT: 39 IU/L — ABNORMAL HIGH (ref 0–32)
AST: 46 IU/L — ABNORMAL HIGH (ref 0–40)
Albumin: 3.3 g/dL — ABNORMAL LOW (ref 3.9–4.9)
Alkaline Phosphatase: 84 IU/L (ref 44–121)
BUN/Creatinine Ratio: 20 (ref 12–28)
BUN: 17 mg/dL (ref 8–27)
Bilirubin Total: 0.6 mg/dL (ref 0.0–1.2)
CO2: 28 mmol/L (ref 20–29)
Calcium: 9.3 mg/dL (ref 8.7–10.3)
Chloride: 102 mmol/L (ref 96–106)
Creatinine, Ser: 0.87 mg/dL (ref 0.57–1.00)
Globulin, Total: 1.9 g/dL (ref 1.5–4.5)
Glucose: 81 mg/dL (ref 70–99)
Potassium: 5.5 mmol/L — ABNORMAL HIGH (ref 3.5–5.2)
Sodium: 142 mmol/L (ref 134–144)
Total Protein: 5.2 g/dL — ABNORMAL LOW (ref 6.0–8.5)
eGFR: 72 mL/min/{1.73_m2} (ref >59)

## 2023-03-27 LAB — PREALBUMIN: PREALBUMIN: 13 mg/dL (ref 10–36)

## 2023-03-27 LAB — VITAMIN C: Vitamin C: 2 mg/dL (ref 0.4–2.0)

## 2023-03-27 LAB — TSH: TSH: 2.54 u[IU]/mL (ref 0.450–4.500)

## 2023-03-30 ENCOUNTER — Other Ambulatory Visit: Payer: Self-pay

## 2023-03-30 DIAGNOSIS — E875 Hyperkalemia: Secondary | ICD-10-CM

## 2023-03-30 LAB — POTASSIUM: Potassium: 5.1 mmol/L (ref 3.5–5.2)

## 2023-03-30 NOTE — Progress Notes (Signed)
Repeat K is 5.1 is lower - still has to get lower- advise to avoid fruit juices or potassium supplements repeat in 2 weeks

## 2023-03-31 NOTE — Telephone Encounter (Signed)
1. Lets plan to reduce the dose to 1.5 scoops a day  2. Can you find another company powder with same protein content but with less potassium  3. Yes the protein may be reason for high potassium  4. Can we also have her dietician help her with alternative supplemets with low K , am sure they are available for patients with renal issues

## 2023-04-02 ENCOUNTER — Telehealth: Payer: Medicare Other | Admitting: Physician Assistant

## 2023-04-14 ENCOUNTER — Other Ambulatory Visit: Payer: Self-pay

## 2023-04-14 DIAGNOSIS — R7989 Other specified abnormal findings of blood chemistry: Secondary | ICD-10-CM

## 2023-04-14 DIAGNOSIS — E875 Hyperkalemia: Secondary | ICD-10-CM

## 2023-04-14 DIAGNOSIS — E46 Unspecified protein-calorie malnutrition: Secondary | ICD-10-CM

## 2023-04-14 DIAGNOSIS — E43 Unspecified severe protein-calorie malnutrition: Secondary | ICD-10-CM

## 2023-04-14 NOTE — Telephone Encounter (Signed)
Check : cbc,cmp, copper, tsh , INR

## 2023-04-20 NOTE — Progress Notes (Signed)
Albumin is low this time- sometimes can be low during an infection - lets recheck in 2-3 weeks CMP- if still low need to discuss next steps

## 2023-04-22 NOTE — Progress Notes (Signed)
Copper is low but getting better- continue multivitamin with copper supplement

## 2023-04-23 ENCOUNTER — Other Ambulatory Visit: Payer: Self-pay | Admitting: Gastroenterology

## 2023-04-28 ENCOUNTER — Other Ambulatory Visit: Payer: Self-pay

## 2023-04-28 DIAGNOSIS — R7989 Other specified abnormal findings of blood chemistry: Secondary | ICD-10-CM

## 2023-04-28 NOTE — Telephone Encounter (Signed)
1. Labs 4 week from last  2. Video visit to discuss everything or in person after labs return  3. In the meanwhile continue what she is doing

## 2023-05-19 NOTE — Progress Notes (Signed)
Labs are similar to last when checked a month back - how is she doing , how is her weigh doing

## 2023-05-20 LAB — CBC WITH DIFFERENTIAL/PLATELET
Basophils Absolute: 0 10*3/uL (ref 0.0–0.2)
Basos: 1 %
EOS (ABSOLUTE): 0 10*3/uL (ref 0.0–0.4)
Eos: 1 %
Hematocrit: 34.2 % (ref 34.0–46.6)
Hemoglobin: 11.2 g/dL (ref 11.1–15.9)
Immature Grans (Abs): 0 10*3/uL (ref 0.0–0.1)
Immature Granulocytes: 0 %
Lymphocytes Absolute: 1.4 10*3/uL (ref 0.7–3.1)
Lymphs: 25 %
MCH: 32.8 pg (ref 26.6–33.0)
MCHC: 32.7 g/dL (ref 31.5–35.7)
MCV: 100 fL — ABNORMAL HIGH (ref 79–97)
Monocytes Absolute: 0.5 10*3/uL (ref 0.1–0.9)
Monocytes: 8 %
Neutrophils Absolute: 3.7 10*3/uL (ref 1.4–7.0)
Neutrophils: 65 %
Platelets: 215 10*3/uL (ref 150–450)
RBC: 3.41 x10E6/uL — ABNORMAL LOW (ref 3.77–5.28)
RDW: 12.6 % (ref 11.7–15.4)
WBC: 5.6 10*3/uL (ref 3.4–10.8)

## 2023-05-20 LAB — PROTIME-INR
INR: 1.1 (ref 0.9–1.2)
Prothrombin Time: 12.1 s — ABNORMAL HIGH (ref 9.1–12.0)

## 2023-05-20 LAB — COMPREHENSIVE METABOLIC PANEL
ALT: 30 IU/L (ref 0–32)
AST: 42 IU/L — ABNORMAL HIGH (ref 0–40)
Albumin: 3 g/dL — ABNORMAL LOW (ref 3.9–4.9)
Alkaline Phosphatase: 86 IU/L (ref 44–121)
BUN/Creatinine Ratio: 32 — ABNORMAL HIGH (ref 12–28)
BUN: 20 mg/dL (ref 8–27)
Bilirubin Total: 0.7 mg/dL (ref 0.0–1.2)
CO2: 22 mmol/L (ref 20–29)
Calcium: 7.4 mg/dL — ABNORMAL LOW (ref 8.7–10.3)
Chloride: 106 mmol/L (ref 96–106)
Creatinine, Ser: 0.63 mg/dL (ref 0.57–1.00)
Globulin, Total: 1.8 g/dL (ref 1.5–4.5)
Glucose: 57 mg/dL — ABNORMAL LOW (ref 70–99)
Potassium: 3.8 mmol/L (ref 3.5–5.2)
Sodium: 143 mmol/L (ref 134–144)
Total Protein: 4.8 g/dL — ABNORMAL LOW (ref 6.0–8.5)
eGFR: 96 mL/min/{1.73_m2} (ref >59)

## 2023-05-20 LAB — COPPER, SERUM: Copper: 59 ug/dL — ABNORMAL LOW (ref 80–158)

## 2023-05-20 LAB — TSH: TSH: 3.19 u[IU]/mL (ref 0.450–4.500)

## 2023-05-20 NOTE — Progress Notes (Signed)
Blood glucose was a bit low- check if she was dizzy or feeling unwell at the time of labs - ensure she drinks or consumes foods with carbs or sugars regularly

## 2023-05-25 ENCOUNTER — Ambulatory Visit: Payer: Medicare Other | Admitting: Gastroenterology

## 2023-05-25 ENCOUNTER — Ambulatory Visit (INDEPENDENT_AMBULATORY_CARE_PROVIDER_SITE_OTHER): Payer: Medicare Other | Admitting: Gastroenterology

## 2023-05-25 ENCOUNTER — Encounter: Payer: Self-pay | Admitting: Gastroenterology

## 2023-05-25 VITALS — BP 109/75 | HR 72 | Temp 98.1°F | Wt 174.0 lb

## 2023-05-25 DIAGNOSIS — K9049 Malabsorption due to intolerance, not elsewhere classified: Secondary | ICD-10-CM | POA: Diagnosis not present

## 2023-05-25 DIAGNOSIS — R609 Edema, unspecified: Secondary | ICD-10-CM | POA: Diagnosis not present

## 2023-05-25 DIAGNOSIS — R630 Anorexia: Secondary | ICD-10-CM | POA: Diagnosis not present

## 2023-05-25 DIAGNOSIS — R0601 Orthopnea: Secondary | ICD-10-CM | POA: Diagnosis not present

## 2023-05-25 DIAGNOSIS — E8809 Other disorders of plasma-protein metabolism, not elsewhere classified: Secondary | ICD-10-CM

## 2023-05-25 NOTE — Progress Notes (Signed)
[name] MD, MRCP(U.K) [address]  [address]  [place], [place] [address]  Main: [phone]  Fax: [phone]   Primary Care Physician: Encarnacion Slates, PA-C  Primary Gastroenterologist:  Dr. [name]   Chief Complaint  Patient presents with   Elevated LFTs    HPI: [name] is a 72 y.o. female  Summary of history :   Initially referred and seen on 01/08/2021 for vomiting after eating.  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 2019 she was seen by [facility] and underwent hemorrhoidal banding.  .Does complain of heartburn despite being on famotidine.  Previously was on pantoprazole.  Seen by the cancer center for iron deficiency anemia in March 2022 hemoglobin 13.1 g has received IV iron.   01/08/2021: Tested positive H. pylori.  Treated with quadruple based bismuth regimen. 02/04/2021: Hemoglobin 13.5 g, ferritin 213, B12 normal, folate normal, CMP normal 01/28/2021: EGD: Features of gastric bypass seen otherwise procedure was normal.  Colonoscopy also performed on the same day.  2 sessile polyps 10 to 12 mm were resected colon there were tubular adenomas repeat colonoscopy in 3 years  03/19/2021: H. pylori breath test negative  10/03/2021: Ultrasound abdomen showed small amount of ascites bilateral pleural effusions.  08/06/2021 echo shows normal study normal LV function  12/12/2021: EGD and colonoscopy performed with biopsies of the stomach and duodenum jejunum: Taken.  No evidence of duodenitis or celiac disease.  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  Terminal ileum appeared normal no evidence of microscopic colitis . 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  The process of illumination the only obvious cause for the low albumin was the gastric bypass Commenced on TPN on 12/23/2021.  Did very well gained her weight back pulmonary edema effusions resolved peripheral edema resolved.     Interval history 05/11/2022-05/25/2023   Over the past few months she has gradually began to have peripheral edema feels weaker serum albumin levels have started to decline she feels short of breath when she lays flat appetite has reduced.  She is maintaining her protein intake as much as she can with a lot of fish consuming up to 120 g of protein a day.  She feels like she is deteriorating no matter what       Latest Ref Rng & Units 05/18/2023    9:21 AM 04/19/2023   12:00 PM 03/24/2023   11:48 AM  Hepatic Function  Total Protein 6.0 - 8.5 g/dL 4.8  4.6  5.2   Albumin 3.9 - 4.9 g/dL 3.0  2.8  3.3   AST 0 - 40 IU/L 42  40  46   ALT 0 - 32 IU/L 30  36  39   Alk Phosphatase 44 - 121 IU/L 86  102  84   Total Bilirubin 0.0 - 1.2 mg/dL 0.7  0.6  0.6       Latest Ref Rng & Units 05/18/2023    9:21 AM 04/19/2023   12:00 PM 03/24/2023   11:48 AM  CBC  WBC 3.4 - 10.8 x10E3/uL 5.6  5.9  5.7  Hemoglobin 11.1 - 15.9 g/dL 16.1  09.6  04.5   Hematocrit 34.0 - 46.6 % 34.2  37.5  37.8   Platelets 150 - 450 x10E3/uL 215  200  237     Current Outpatient Medications  Medication Sig Dispense Refill   albuterol (VENTOLIN HFA) 108 (90 Base) MCG/ACT inhaler      Ascorbic Acid (VITAMIN C PO) Take 2,000 Units by mouth daily.     azelastine (ASTELIN) 0.1 % nasal spray SPRAY 2 SPRAYS INTO EACH NOSTRIL TWICE A DAY     calcitRIOL (ROCALTROL) 0.5 MCG capsule Take 0.5 mcg by mouth daily.  5   Cholecalciferol ([employer] D3) 125 MCG (5000 UT) capsule Take 1 tablet by mouth daily.     denosumab (PROLIA) 60 MG/ML SOSY injection Inject 60 mg into the skin every 6 (six) months.      fluticasone (FLONASE) 50 MCG/ACT nasal spray Place into both nostrils.     levothyroxine (SYNTHROID) 25 MCG tablet Take 25 mcg by mouth every morning.     loratadine (CLARITIN) 10 MG tablet Take 1 tablet by mouth daily.     Melatonin 10 MG TABS Take 1 tablet by mouth as needed.     Multiple Vitamin (M.V.I. ADULT IV) Take 1 tablet by mouth daily.     pantoprazole (PROTONIX) 20 MG tablet TAKE 1 TABLET BY MOUTH EVERY DAY 90 tablet 3   Vitamin A 2400 MCG (8000 UT) CAPS Take 1 capsule by mouth daily.     Vitamin E 180 MG (400 UNIT) CAPS Take 1 capsule by mouth daily.     Current Facility-Administered Medications  Medication Dose Route Frequency Provider Last Rate Last Admin   albumin human 25 % solution 25 g  25 g Intravenous Once [name], MD        Allergies as of 05/25/2023 - Review Complete 05/25/2023  Allergen Reaction Noted   Zoledronic acid Other (See Comments) 09/02/2021   Azithromycin Diarrhea 08/15/2014   Milk (cow) Diarrhea 08/15/2014   Prednisone Diarrhea 08/15/2014   Sulfa antibiotics Itching 08/15/2014   Tape Dermatitis 02/24/2018      ROS:  General: Negative for anorexia, weight loss, fever, chills, fatigue, weakness. ENT: Negative for hoarseness, difficulty swallowing , nasal congestion. CV: Negative for chest pain, angina, palpitations, dyspnea on exertion, peripheral edema.  Respiratory: Negative for dyspnea at rest, dyspnea on exertion, cough, sputum, wheezing.  GI: See history of present illness. GU:  Negative for dysuria, hematuria, urinary incontinence, urinary frequency, nocturnal urination.  Endo: Negative for unusual weight change.    Physical Examination:   BP 109/75   Pulse 72   Temp 98.1 F (36.7 C) (Oral)   Wt 174 lb (78.9 kg)   BMI 29.87 kg/m   General: Well-nourished, well-developed in no acute distress.  Eyes: No icterus. Conjunctivae pink. Mouth: Oropharyngeal mucosa moist and pink , no lesions erythema or exudate. Lungs: Clear to  auscultation bilaterally. Non-labored. Heart: Regular rate and rhythm, no murmurs rubs or gallops.  Abdomen: Bowel sounds are normal, nontender, nondistended, no hepatosplenomegaly or masses, no abdominal bruits or hernia , no rebound or guarding.   Extremities: Significant bilateral pitting edema Neuro: Alert and oriented x 3.  Grossly intact. Skin: Warm and dry, no jaundice.   Psych: Alert and cooperative, normal mood and affect.   Imaging Studies: No results found.  Assessment and Plan:   [name] is a 72 y.o. y/o female with history of gastric bypass and previously had lost weight gradually  over years.  Sent her for a second opinion to [facility].  They had no further input.  I performed EGD colonoscopy no abnormalities noted.  My final impression was that she was losing weight due to the gastric bypass and she was having her protein-losing enteropathy.  We placed her on TPN for about a year she did remarkably well all her peripheral edema, pleural effusions hair loss resolved and she was taken off TPN.  Over the past few months her symptoms are gradually recurring she is developing significant peripheral edema orthopnea.  Appetite has decreased.  I believe she may be getting closer to requiring TPN yet again and we should start the process before she gets too sick.       Plan 1.  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

## 2023-06-01 ENCOUNTER — Telehealth: Payer: Self-pay

## 2023-06-01 NOTE — Telephone Encounter (Signed)
Per Dr. Tobi Bastos: This is a patient who needs to start on TPN she has been on it in the past. Kandis Cocking did not start the process last week. We would need to begin the process of getting her port and TPN. I thing in the past the dietitian helped with this   Called patient to find out some information she states she got her picc line at [facility] last time but she is willing to come to [facility] this time. She states Mondays and Tuesday work better for her. She states the home health agency she used was [facility] and there phone number is [phone] and the pharmacy she used was [facility] and there phone number was [phone]. She states that she is not in [place] anymore she only goes there for the Summer. She is back in [place]. She is ready to get this done ASAP because the swelling is getting bad and she does not feel good. Informed her I would start working on this and I would call her back as soon as I found out some information.   Called [facility] and they state they only do the TPN they do not have a dietitian on staff.   Asked Dr. Tobi Bastos about this and he states In terms of the PICC line, I believe the cancer center has a dietitian who notes the whole process I will add Dr. Smith Robert to this chat to see if she can advise [place] on the dietitian.

## 2023-06-02 DIAGNOSIS — T82838A Hemorrhage of vascular prosthetic devices, implants and grafts, initial encounter: Secondary | ICD-10-CM

## 2023-06-02 NOTE — Telephone Encounter (Signed)
Per [name] with [facility]  spoke with patient. She is going to have her nursing through [employer]. I know you have labs scheduled to be drawn tomorrow during picc placement, but we likely won't be able to use those for TPN. We need labs drawn within 72 hours prior to initiating the TPN. If we have labs drawn on Thursday, it is highly unlikely that we would be able to get the labs back, assessed, and TPN bags pumped by pharmacy prior to 4pm on Friday when nursing could see patient. We are planning to start Patient on Monday/Tuesday. If we could have labs drawn on Friday by home health, we should be good to go. I faxed over an order form for the flush/cath care supplies so that patient may use through the weekend. Could you update the order you already send over to include labs (CMP, BMP, Mag, Phos should be all that's needed). Let me know if you have any questions - thank you!!  Added this to the order sheet and faxed it over to them

## 2023-06-02 NOTE — Telephone Encounter (Signed)
Patient asked if she still needed the mychart visit with Dr. Tobi Bastos on 06/08/2023 because he told her if the TPN was getting set up then she did not need it. Asked Dr. Tobi Bastos and he said lets move it out 3 weeks. Reschedule it to 06/28/2023

## 2023-06-02 NOTE — Telephone Encounter (Signed)
Faxed form she faxed over to [place] also back to them also.

## 2023-06-02 NOTE — Telephone Encounter (Signed)
Called [name] with [facility] and they will be able to take care of the patient TPN. They do have a Dietician on staff that will review the patient labs each time and recommend what TPN she needs each week. She states she needs [place] to fax [place] patient demographics, clinical notes, and this order form (don't worry about filling it out completely - just write in that patient needs to start home TPN so that we have something concrete to start with). I did this and faxed it to [phone]. She states they will need CBC, BMP, CRP, Magnesium, phosphorus, and triglycerides when the picc line is placed for the first TPN.  Called same day talk to cindy and they do the PICC lines there. They can set up the patient for this. They will draw labs also before the picc line or if we wanted it out of the PICC line we could say that on the order. Faxed the order sheet to them at [phone] and got confirmation it went through.

## 2023-06-02 NOTE — Telephone Encounter (Signed)
Do you want me to Add copper to the labs they draw for the TPN. Right now she said they Draw CMP, BMP, Mag, Phosphorus, CRP and Triglycerides

## 2023-06-03 ENCOUNTER — Inpatient Hospital Stay
Admission: RE | Admit: 2023-06-03 | Discharge: 2023-06-03 | Disposition: A | Discharge status: Home / Self Care | Payer: Self-pay | Source: Ambulatory Visit | Attending: Gastroenterology | Admitting: Gastroenterology

## 2023-06-03 ENCOUNTER — Telehealth: Payer: Self-pay

## 2023-06-03 ENCOUNTER — Ambulatory Visit
Admission: RE | Admit: 2023-06-03 | Discharge: 2023-06-03 | Disposition: A | Discharge status: Home / Self Care | Payer: Medicare Other | Source: Ambulatory Visit | Attending: Gastroenterology | Admitting: Gastroenterology

## 2023-06-03 VITALS — BP 105/67 | HR 70 | Temp 97.9°F | Resp 15 | Ht 64.0 in | Wt 170.0 lb

## 2023-06-03 DIAGNOSIS — R7989 Other specified abnormal findings of blood chemistry: Secondary | ICD-10-CM | POA: Insufficient documentation

## 2023-06-03 DIAGNOSIS — E8809 Other disorders of plasma-protein metabolism, not elsewhere classified: Secondary | ICD-10-CM | POA: Insufficient documentation

## 2023-06-03 LAB — CBC
HCT: 35.1 % — ABNORMAL LOW (ref 36.0–46.0)
Hemoglobin: 11.4 g/dL — ABNORMAL LOW (ref 12.0–15.0)
MCH: 33.1 pg (ref 26.0–34.0)
MCHC: 32.5 g/dL (ref 30.0–36.0)
MCV: 102 fL — ABNORMAL HIGH (ref 80.0–100.0)
Platelets: 202 10*3/uL (ref 150–400)
RBC: 3.44 MIL/uL — ABNORMAL LOW (ref 3.87–5.11)
RDW: 13.5 % (ref 11.5–15.5)
WBC: 5.4 10*3/uL (ref 4.0–10.5)
nRBC: 0 % (ref 0.0–0.2)

## 2023-06-03 LAB — BASIC METABOLIC PANEL
Anion gap: 8 (ref 5–15)
BUN: 15 mg/dL (ref 8–23)
CO2: 25 mmol/L (ref 22–32)
Calcium: 7.6 mg/dL — ABNORMAL LOW (ref 8.9–10.3)
Chloride: 106 mmol/L (ref 98–111)
Creatinine, Ser: 0.61 mg/dL (ref 0.44–1.00)
GFR, Estimated: >60 mL/min (ref >60)
Glucose, Bld: 119 mg/dL — ABNORMAL HIGH (ref 70–99)
Potassium: 3.7 mmol/L (ref 3.5–5.1)
Sodium: 139 mmol/L (ref 135–145)

## 2023-06-03 LAB — TRIGLYCERIDES: Triglycerides: 64 mg/dL (ref <150)

## 2023-06-03 LAB — C-REACTIVE PROTEIN: CRP: 0.6 mg/dL (ref <1.0)

## 2023-06-03 LAB — PHOSPHORUS: Phosphorus: 3.6 mg/dL (ref 2.5–4.6)

## 2023-06-03 LAB — MAGNESIUM: Magnesium: 1.6 mg/dL — ABNORMAL LOW (ref 1.7–2.4)

## 2023-06-03 MED ORDER — HEPARIN SOD (PORK) LOCK FLUSH 100 UNIT/ML IV SOLN
250.0000 [IU] | INTRAVENOUS | Status: AC | PRN
  Administered 2023-06-03: 250 [IU]

## 2023-06-03 NOTE — Progress Notes (Signed)
Peripherally Inserted Central Catheter Placement  The IV Nurse has discussed with the patient and/or persons authorized to consent for the patient, the purpose of this procedure and the potential benefits and risks involved with this procedure.  The benefits include less needle sticks, lab draws from the catheter, and the patient may be discharged home with the catheter. Risks include, but not limited to, infection, bleeding, blood clot (thrombus formation), and puncture of an artery; nerve damage and irregular heartbeat and possibility to perform a PICC exchange if needed/ordered by physician.  Alternatives to this procedure were also discussed.  Bard Power PICC patient education guide, fact sheet on infection prevention and patient information card has been provided to patient /or left at bedside.    PICC Placement Documentation  PICC Double Lumen 12/19/21 Right Basilic 374 cm 0 cm (Active)     PICC Double Lumen 06/03/23 Right Basilic 38 cm 1 cm (Active)  Indication for Insertion or Continuance of Line Administration of hyperosmolar/irritating solutions (i.e. TPN, Vancomycin, etc.);Home intravenous therapies (PICC only) 06/03/23 1500  Exposed Catheter (cm) 1 cm 06/03/23 1500  Site Assessment Clean, Dry, Intact 06/03/23 1500  Lumen #1 Status Flushed;Blood return noted;Heparin locked 06/03/23 1500  Lumen #2 Status Flushed;Blood return noted;Heparin locked 06/03/23 1500  Dressing Type Transparent;Securing device 06/03/23 1500  Dressing Status Antimicrobial disc in place 06/03/23 1500  Line Care Connections checked and tightened 06/03/23 1500  Line Adjustment (NICU/IV Team Only) No 06/03/23 1500  Dressing Intervention New dressing 06/03/23 1500  Dressing Change Due 06/10/23 06/03/23 1500       Vernona Rieger  Scotty Pinder 06/03/2023, 3:12 PM

## 2023-06-03 NOTE — Telephone Encounter (Signed)
-----   Message from [name] sent at 06/03/2023  1:53 PM EDT ----- Magnesium is low suggest starting on magnesium oxide 400 mg pills 1 tablet daily.   Recheck in 7 days - check if having diarrhea

## 2023-06-03 NOTE — Telephone Encounter (Signed)
Asked [name] to add on Copper to TPN labs each time by email per Dr. Kem Parkinson message and she emailed back I received both the documents updated yesterday - thank you! We will send cath care today for patient to flush her line with, and we will get labs drawn tomorrow/Monday and start tpn Monday/Tuesday if everything looks right. What labs does Dr. Tobi Bastos want added on? We can include those in the final TPN order form that we have signed (it will include the TPN order, lab orders, flush orders, and nursing orders all together)

## 2023-06-03 NOTE — Telephone Encounter (Signed)
Have informed patient in other telephone message

## 2023-06-03 NOTE — Telephone Encounter (Signed)
Called and left a message for call back  

## 2023-06-03 NOTE — Telephone Encounter (Signed)
Patient called back and verbalized understanding of results. She states she will get the Magnesium and start taking it and informing [facility] catie to recheck magnesium in 7 days   Emailed [name]: We will need her magnesium level checked in 1 week because they were low today so Dr. Tobi Bastos staring her on Magnesium Oxide and wants the levels recheck in 7 days.  Thank you  Morrie Sheldon CMA

## 2023-06-03 NOTE — Telephone Encounter (Signed)
Sent a email to [facility] to add on copper.

## 2023-06-03 NOTE — Telephone Encounter (Signed)
Per Dr. Tobi Bastos he Verbally said these were good starter labs with the copper if anything needed to be added it would be up to the dietician

## 2023-06-03 NOTE — Telephone Encounter (Signed)
Do you want any other labs at this time other them cooper added to there stander labs. They have CBC, BMP, CRP, Magnesium and Phosphorus.  They said they can add on any more labs you would like also?

## 2023-06-04 NOTE — Telephone Encounter (Signed)
Per [name]: Great. Her Magnesium lab should improve with the TPN as well.     We are planning to have home health draw labs tomorrow for TPN, with initiation on Monday. I will follow up with you Monday about getting Dr. Tobi Bastos to sign off on our TPN order so we can get the bags pumped     Thank you!

## 2023-06-07 ENCOUNTER — Telehealth: Payer: Self-pay

## 2023-06-07 ENCOUNTER — Encounter: Payer: Self-pay | Admitting: Surgery

## 2023-06-07 ENCOUNTER — Other Ambulatory Visit: Payer: Self-pay | Admitting: Surgery

## 2023-06-07 ENCOUNTER — Ambulatory Visit
Admission: RE | Admit: 2023-06-07 | Discharge: 2023-06-07 | Disposition: A | Discharge status: Home / Self Care | Payer: Medicare Other | Source: Ambulatory Visit | Attending: Surgery | Admitting: Surgery

## 2023-06-07 ENCOUNTER — Ambulatory Visit: Payer: Medicare Other | Admitting: Surgery

## 2023-06-07 VITALS — BP 119/83 | HR 103 | Temp 97.8°F | Ht 64.0 in | Wt 173.6 lb

## 2023-06-07 DIAGNOSIS — T82838A Hemorrhage of vascular prosthetic devices, implants and grafts, initial encounter: Secondary | ICD-10-CM | POA: Insufficient documentation

## 2023-06-07 DIAGNOSIS — M7989 Other specified soft tissue disorders: Secondary | ICD-10-CM | POA: Insufficient documentation

## 2023-06-07 NOTE — Telephone Encounter (Signed)
[facility] is seeing patient today they said for patient to head over to there office right now. She said she will go right now. Place referral urgently

## 2023-06-07 NOTE — Telephone Encounter (Signed)
-----   Message from [facility] sent at 06/07/2023  3:37 PM EDT ----- Please let her knnow there is no DVT ----- Message ----- From: Interface, Rad Results In Sent: 06/07/2023   3:15 PM EDT To: Leafy Ro, MD

## 2023-06-07 NOTE — Addendum Note (Signed)
Addended by: Radene Knee L on: 06/07/2023 10:59 AM   Modules accepted: Orders

## 2023-06-07 NOTE — Patient Instructions (Addendum)
[address] [place], [place] Go to Imaging  [phone]    PICC Home Care Guide A peripherally inserted central catheter (PICC) is a form of IV access that allows medicines and IV fluids to be quickly put into the blood and spread throughout the body. The PICC is a long, thin, flexible tube (catheter) that is put into a vein in a person's arm or leg. The catheter ends in a large vein just outside the heart called the superior vena cava (SVC). After the PICC is put in, a chest X-ray may be done to make sure that it is in the right place. A PICC may be placed for different reasons, such as: To give medicines and liquid nutrition. To give IV fluids and blood products. To take blood samples often. If there is trouble placing a peripheral intravenous (PIV) catheter. If cared for properly, a PICC can remain in place for many months. Having a PICC can allow you to go home from the hospital sooner and continue treatment at home. Medicines and PICC care can be managed at home by a family member, caregiver, or home health care team. What are the risks? Generally, having a PICC is safe. However, problems may occur, including: A blood clot (thrombus) forming in or at the end of the PICC. A blood clot forming in a vein (deep vein thrombosis) or traveling to the lung (pulmonary embolism). Inflammation of the vein (phlebitis) in which the PICC is placed. Infection at the insertion site or in the blood. Blood infections from central lines, like PICCs, can be serious and often require a hospital stay. PICC malposition, or PICC movement or poor placement. A break or cut in the PICC. Do not use scissors near the PICC. Nerve or tendon irritation or injury during PICC insertion. How to care for your PICC Please follow the specific guidelines provided by your health care provider. Preventing infection You and any caregivers should wash your hands often with soap and water for at least 20 seconds. Wash  hands: Before touching the PICC or the infusion device. Before changing a bandage (dressing). Do not change the dressing unless you have been taught to do so and have shown you are able to change it safely. Flush the PICC as told. Tell your health care provider right away if the PICC is hard to flush or does not flush. Do not use force to flush the PICC. Use clean and germ-free (sterile) supplies only. Keep the supplies in a dry place. Do not reuse needles, syringes, or any other supplies. Reusing supplies can lead to infection. Keep the PICC dressing dry and secure it with tape if the edges stop sticking to your skin. Check your PICC insertion site every day for signs of infection. Check for: Redness, swelling, or pain. Fluid or blood. Warmth. Pus or a bad smell. Preventing other problems Do not use a syringe that is less than 10 mL to flush the PICC. Do not have your blood pressure checked on the arm in which the PICC is placed. Do not ever pull or tug on the PICC. Keep it secured to your arm with tape or a stretch wrap when not in use. Do not take the PICC out yourself. Only a trained health care provider should remove the PICC. Keep pets and children away from your PICC. How to care for your PICC dressing Keep your PICC dressing clean and dry to prevent infection. Do not take baths, swim, or use a hot tub until  your health care provider approves. Ask your health care provider if you can take showers. You may only be allowed to take sponge baths. When you are allowed to shower: Ask your health care provider to teach you how to wrap the PICC. Cover the PICC with clear plastic wrap and tape to keep it dry while showering. Follow instructions from your health care provider about how to take care of your insertion site and dressing. Make sure you: Wash your hands with soap and water for at least 20 seconds before and after you change your dressing. If soap and water are not available, use hand  sanitizer. Change your dressing only if taught to do so by your health care provider. Your PICC dressing needs to be changed if it becomes loose or wet. Leave stitches (sutures), skin glue, or adhesive strips in place. These skin closures may need to stay in place for 2 weeks or longer. If adhesive strip edges start to loosen and curl up, you may trim the loose edges. Do not remove adhesive strips completely unless your health care provider tells you to do that. Follow these instructions at home: Disposal of supplies Throw away any syringes in a disposal container that is meant for sharp items (sharps container). You can buy a sharps container from a pharmacy, or you can make one by using an empty, hard plastic bottle with a lid. Place any used dressings or infusion bags into a plastic bag. Throw that bag in the trash. General instructions  Always carry your PICC identification card or wear a medical alert bracelet. Keep the tube clamped at all times, unless it is being used. Always carry a smooth-edge clamp with you to clamp the PICC if it breaks. Do not use scissors or sharp objects near the tube. You may bend your arm and move it freely. If your PICC is near or at the bend of your elbow, avoid activity with repeated motion at the elbow. Avoid lifting heavy objects as told by your health care provider. Keep all follow-up visits. This is important. You will need to have your PICC dressing changed at least once a week. Contact a health care provider if: You have pain in your arm, ear, face, or teeth. You have a fever or chills. You have redness, swelling, or pain around the insertion site. You have fluid or blood coming from the insertion site. Your insertion site feels warm to the touch. You have pus or a bad smell coming from the insertion site. Your skin feels hard and raised around the insertion site. Your PICC dressing has gotten wet or is coming off and you have not been taught how to  change it. Get help right away if: You have problems with your PICC, such as your PICC: Was tugged or pulled and has partially come out. Do not  push the PICC back in. Cannot be flushed, is hard to flush, or leaks around the insertion site when it is flushed. Makes a flushing sound when it is flushed. Appears to have a hole or tear. Is accidentally pulled all the way out. If this happens, cover the insertion site with a gauze dressing. Do not throw the PICC away. Your health care provider will need to check it to be sure the entire catheter came out. You feel your heart racing or skipping beats, or you have chest pain. You have shortness of breath or trouble breathing. You have swelling, redness, warmth, or pain in the arm in which  the PICC is placed. You have a red streak going up your arm that starts under the PICC dressing. These symptoms may be an emergency. Get help right away. Call 911. Do not wait to see if the symptoms will go away. Do not drive yourself to the hospital. Summary A peripherally inserted central catheter (PICC) is a long, thin, flexible tube (catheter) that is put into a vein in the arm or leg. If cared for properly, a PICC can remain in place for many months. Having a PICC can allow you to go home from the hospital sooner and continue treatment at home. The PICC is inserted using a germ-free (sterile) technique by a specially trained health care provider. Only a trained health care provider should remove it. Do not have your blood pressure checked on the arm in which your PICC is placed. Always keep your PICC identification card with you. This information is not intended to replace advice given to you by your health care provider. Make sure you discuss any questions you have with your health care provider. Document Revised: 03/19/2021 Document Reviewed: 03/19/2021 Elsevier Patient Education  2024 [employer].

## 2023-06-07 NOTE — Telephone Encounter (Signed)
Patient notified of no DVT and also per Dr Everlene Farrier her chest xray looks good.

## 2023-06-07 NOTE — Telephone Encounter (Signed)
Sent catherine a email with the TPN infusion letting her know  this Heyy also she was having some bleeding around her PICC line so she saw general surgery so general surgery and Dr. Tobi Bastos wants to delay the TPN start date till Friday  06/11/2023.

## 2023-06-07 NOTE — Telephone Encounter (Signed)
Per Dr. Everlene Farrier: I just saw her, sending for cxr and u/s. Probably best to hold off tpn for a couple of days otherwise picc looks ok   I have asked Dr. Tobi Bastos when he wanted patient to start the TPN

## 2023-06-07 NOTE — Telephone Encounter (Signed)
Sent catie a email today informing her that patient is having some bleeding around her PICC line so she saw general surgery today.  General surgery and Dr. Tobi Bastos wants to delay the TPN start date till Friday  06/11/2023.

## 2023-06-08 ENCOUNTER — Encounter: Payer: Self-pay | Admitting: Physician Assistant

## 2023-06-08 ENCOUNTER — Telehealth: Payer: Medicare Other | Admitting: Gastroenterology

## 2023-06-08 NOTE — Telephone Encounter (Signed)
Per [name] with [facility] - I just spoke to Islam and she said there were no complications and no issues with the PICC line. Does Dr. Tobi Bastos still want to delay until Friday to start TPN? Or start tomorrow? We would just need to pull new labs if we wait until Friday, but if that is what Dr. Tobi Bastos wants to do we can certainly coordinate that  Please advised which day you want to start the TPN

## 2023-06-08 NOTE — Telephone Encounter (Signed)
Informed Santina Evans with [facility] and informed patient by [facility]

## 2023-06-08 NOTE — Telephone Encounter (Signed)
Called Radiology department and they transfer me to [facility] radiology reading department and they said it was order as routine but she is going to change it to urgent to get it read in the next hour.

## 2023-06-08 NOTE — Telephone Encounter (Signed)
The chest Xray is back. Please advise is TPN is okay for tomorrow

## 2023-06-09 NOTE — Addendum Note (Signed)
Addended by: Cynda Acres A on: 06/09/2023 08:21 AM   Modules accepted: Orders

## 2023-06-09 NOTE — Progress Notes (Signed)
Patient ID: [name], female   DOB: September 11, 1951, 72 y.o.   MRN: [id number]  HPI [name] is a 72 y.o. female seen in consultation at the request of Dr. Tobi Bastos.  She did have a PICC line placement on 06/03/2023.  Patient reports that the axis was somewhat difficult but the nurse was finally able to get it.  I please note that personally reviewed chest x-ray but this chest x-ray was prior to placement of PICC line and I do not have a chest x-ray following PICC line placement or any other images to confirm appropriate positioning.  She reports that she has been having some intermittent bleeding and bruising at the exit site.  There is some pain that is mild intermittent and worsening when she moves the arm.  No fevers no chills.  She is supposed to start TPN this week   HPI  Past Medical History:  Diagnosis Date   Asthma    CHF (congestive heart failure) (HCC)    GERD (gastroesophageal reflux disease)    Hypertension    Internal hemorrhoids    Iron deficiency anemia    Osteoporosis    Peptic ulcer    Tubular adenoma of colon    Vertigo     Past Surgical History:  Procedure Laterality Date   BACK SURGERY     BREAST SURGERY     CHOLECYSTECTOMY     COLONOSCOPY WITH PROPOFOL N/A 06/02/2017   Procedure: COLONOSCOPY WITH PROPOFOL;  Surgeon: [name], MD;  Location: [facility] ENDOSCOPY;  Service: Gastroenterology;  Laterality: N/A;   COLONOSCOPY WITH PROPOFOL N/A 01/28/2021   Procedure: COLONOSCOPY WITH PROPOFOL;  Surgeon: [name], MD;  Location: [facility] ENDOSCOPY;  Service: Gastroenterology;  Laterality: N/A;   COLONOSCOPY WITH PROPOFOL N/A 12/12/2021   Procedure: COLONOSCOPY WITH PROPOFOL;  Surgeon: [name], MD;  Location: [facility] ENDOSCOPY;  Service: Gastroenterology;  Laterality: N/A;  Patient is requesting to be last since she lives in [place].   ESOPHAGOGASTRODUODENOSCOPY (EGD) WITH PROPOFOL N/A 07/01/2017   Procedure: ESOPHAGOGASTRODUODENOSCOPY (EGD) WITH PROPOFOL;  Surgeon:  [name], MD;  Location: [facility] ENDOSCOPY;  Service: Gastroenterology;  Laterality: N/A;   ESOPHAGOGASTRODUODENOSCOPY (EGD) WITH PROPOFOL N/A 01/28/2021   Procedure: ESOPHAGOGASTRODUODENOSCOPY (EGD) WITH PROPOFOL;  Surgeon: [name], MD;  Location: [facility] ENDOSCOPY;  Service: Gastroenterology;  Laterality: N/A;   ESOPHAGOGASTRODUODENOSCOPY (EGD) WITH PROPOFOL N/A 12/12/2021   Procedure: ESOPHAGOGASTRODUODENOSCOPY (EGD) WITH PROPOFOL;  Surgeon: [name], MD;  Location: [facility] ENDOSCOPY;  Service: Gastroenterology;  Laterality: N/A;   FLEXIBLE SIGMOIDOSCOPY N/A 07/01/2017   Procedure: FLEXIBLE SIGMOIDOSCOPY;  Surgeon: [name], MD;  Location: [facility] ENDOSCOPY;  Service: Gastroenterology;  Laterality: N/A;   KNEE SURGERY     mini gastric bypass     SHOULDER SURGERY     TONSILLECTOMY      Family History  Problem Relation Age of Onset   Breast cancer Paternal Grandmother    Diabetes Father    Heart disease Father    Colon cancer Father    Brain cancer Mother    Healthy Daughter    Hypertension Other    Stroke Other    Diabetes Other    Heart attack Other    Obesity Other    Stomach cancer Neg Hx    Pancreatic cancer Neg Hx     Social History Social History   Tobacco Use   Smoking status: Never   Smokeless tobacco: Never  Vaping Use   Vaping status: Never Used  Substance Use Topics  Alcohol use: No    Alcohol/week: 0.0 standard drinks of alcohol   Drug use: Never    Allergies  Allergen Reactions   Zoledronic Acid Other (See Comments)    Other reaction(s): fever and flu-like symptoms   Azithromycin Diarrhea    Other reaction(s): diarrhea   Milk (Cow) Diarrhea   Prednisone Diarrhea    Other Reaction(s): GI Intolerance   Sulfa Antibiotics Itching   Tape Dermatitis    Current Outpatient Medications  Medication Sig Dispense Refill   albuterol (VENTOLIN HFA) 108 (90 Base) MCG/ACT inhaler      Ascorbic Acid (VITAMIN C PO) Take 2,000 Units by mouth daily.      azelastine (ASTELIN) 0.1 % nasal spray SPRAY 2 SPRAYS INTO EACH NOSTRIL TWICE A DAY     calcitRIOL (ROCALTROL) 0.5 MCG capsule Take 0.5 mcg by mouth daily.  5   Cholecalciferol (CVS D3) 125 MCG (5000 UT) capsule Take 1 tablet by mouth daily.     denosumab (PROLIA) 60 MG/ML SOSY injection Inject 60 mg into the skin every 6 (six) months.     fluticasone (FLONASE) 50 MCG/ACT nasal spray Place into both nostrils.     levothyroxine (SYNTHROID) 25 MCG tablet Take 25 mcg by mouth every morning.     loratadine (CLARITIN) 10 MG tablet Take 1 tablet by mouth daily.     Melatonin 10 MG TABS Take 1 tablet by mouth as needed.     Multiple Vitamin (M.V.I. ADULT IV) Take 1 tablet by mouth daily.     pantoprazole (PROTONIX) 20 MG tablet TAKE 1 TABLET BY MOUTH EVERY DAY 90 tablet 3   Vitamin A 2400 MCG (8000 UT) CAPS Take 1 capsule by mouth daily.     Vitamin E 180 MG (400 UNIT) CAPS Take 1 capsule by mouth daily.     Current Facility-Administered Medications  Medication Dose Route Frequency Provider Last Rate Last Admin   albumin human 25 % solution 25 g  25 g Intravenous Once [name], MD         Review of Systems Full ROS  was asked and was negative except for the information on the HPI  Physical Exam Blood pressure 119/83, pulse (!) 103, temperature 97.8 F (36.6 C), temperature source Oral, height 5\' 4"  (1.626 m), weight 173 lb 9.6 oz (78.7 kg), SpO2 96%. CONSTITUTIONAL: NAD. EYES: Pupils are equal, round,Sclera are non-icteric. EARS, NOSE, MOUTH AND THROAT: The oropharynx is clear. The oral mucosa is pink and moist. Hearing is intact to voice. LYMPH NODES:  Lymph nodes in the neck are normal. RESPIRATORY:  Lungs are clear. There is normal respiratory effort, with equal breath sounds bilaterally, and without pathologic use of accessory muscles. CARDIOVASCULAR: Heart is regular without murmurs, gallops, or rubs. GI: The abdomen is  soft, nontender, and nondistended. There are no palpable masses.  There is no hepatosplenomegaly. There are normal bowel sounds in all quadrants. GU: Rectal deferred.   EXT There is a PICC line in place on the right arm.  There is superficial skin hematoma without any evidence of deep hematomas abscess infection or active bleeding.  Normal muscle strength and tone. No cyanosis or edema.   SKIN: Turgor is good and there are no pathologic skin lesions or ulcers. NEUROLOGIC: Motor and sensation is grossly normal. Cranial nerves are grossly intact. PSYCH:  Oriented to person, place and time. Affect is normal.  Data Reviewed  I have personally reviewed the patient's imaging, laboratory findings and medical records.  Assessment/Plan 72 year old female status post PICC line placement on the right side with some superficial nonexpanding hematoma.  Discussed with her about benign findings.  Also discussed with her about obtaining chest x-ray to make sure that the tip in the catheter his is in the correct position also discussed with her about performing an upper extremity ultrasound to rule out any DVTs.  She did have prior chest x-ray but there is no chest x-ray for PICC line placement and I do not have any images. At this time no need for surgical intervention.  Also recommend to place some ice.  May need to hold TPN for couple days.  Discussed with the patient and with Dr. Tobi Bastos in detail.  Please note that I spent 45 min in this encounter including pers. Reviewing images studies, coordination of care, reviewing of medical records, placing orders, counseling the pt and performing documentation  Sterling Big, MD FACS General Surgeon 06/09/2023, 5:56 PM

## 2023-06-11 NOTE — Telephone Encounter (Signed)
Per [name] with [facility] Raelyn Number - just giving you an update. Spoke with Zanylah this morning. She is doing well since starting TPN. She is anxious to get back down to a 12 hour cycle, but she understands why we can't start her out safely on that! She has been the most pleasant patient to work with.

## 2023-06-14 ENCOUNTER — Telehealth: Payer: Self-pay

## 2023-06-14 ENCOUNTER — Encounter: Payer: Self-pay | Admitting: Gastroenterology

## 2023-06-14 NOTE — Telephone Encounter (Signed)
Need updated TPN orders for [facility] for [facility] a [employer] a new start. Need labs twice a week for nursing. They are also  faxing [place] orders to sign and fax back for patient

## 2023-06-15 ENCOUNTER — Other Ambulatory Visit: Payer: Self-pay

## 2023-06-16 ENCOUNTER — Inpatient Hospital Stay: Payer: Medicare Other

## 2023-06-22 ENCOUNTER — Other Ambulatory Visit: Payer: Self-pay

## 2023-06-22 ENCOUNTER — Telehealth: Payer: Self-pay

## 2023-06-22 NOTE — Telephone Encounter (Signed)
Patient verbalized understanding of instructions

## 2023-06-22 NOTE — Telephone Encounter (Signed)
Patient verbalized understanding of results. Patient states she saw that her Magnesium was in the High results. She thinks the Magnesium has been added into her TPN also. She wants to know does she still need to take the Magnesium supplement.

## 2023-06-22 NOTE — Telephone Encounter (Signed)
-----   Message from [name] sent at 06/22/2023 10:03 AM EDT ----- Looks stable

## 2023-06-26 LAB — LAB REPORT - SCANNED: EGFR: 105

## 2023-06-28 ENCOUNTER — Telehealth: Payer: Medicare Other | Admitting: Gastroenterology

## 2023-06-28 ENCOUNTER — Other Ambulatory Visit: Payer: Self-pay

## 2023-06-28 ENCOUNTER — Encounter: Payer: Self-pay | Admitting: Physician Assistant

## 2023-06-28 DIAGNOSIS — Z9884 Bariatric surgery status: Secondary | ICD-10-CM

## 2023-06-28 DIAGNOSIS — K912 Postsurgical malabsorption, not elsewhere classified: Secondary | ICD-10-CM

## 2023-06-28 DIAGNOSIS — E8809 Other disorders of plasma-protein metabolism, not elsewhere classified: Secondary | ICD-10-CM

## 2023-06-28 NOTE — Progress Notes (Signed)
[name] , MD [address]  [address]  [place], [place] [address]  Main: [phone]  Fax: [phone]   Primary Care Physician: Encarnacion Slates, PA-C  Virtual Visit via Video Note  I connected with patient on 06/28/23 at  1:30 PM EDT by video and verified that I am speaking with the correct person using two identifiers.   I discussed the limitations, risks, security and privacy concerns of performing an evaluation and management service by video  and the availability of in person appointments. I also discussed with the patient that there may be a patient responsible charge related to this service. The patient expressed understanding and agreed to proceed.  Location of Patient: Home Location of Provider: Home Persons involved: Patient and provider only   History of Present Illness: Chief Complaint  Patient presents with   Hypoalbuminemia    HPI: [name] is a 72 y.o. female  Summary of history :   Initially referred and seen on 01/08/2021 for vomiting after eating.  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 2019 she was seen by [facility] and underwent hemorrhoidal banding.  .Does complain of heartburn despite being on famotidine.  Previously was on pantoprazole.  Seen by the cancer center for iron deficiency anemia in March 2022 hemoglobin 13.1 g has received IV iron.   01/08/2021: Tested positive H. pylori.  Treated with quadruple based bismuth regimen. 02/04/2021: Hemoglobin 13.5 g, ferritin 213, B12 normal, folate normal, CMP normal 01/28/2021: EGD: Features of gastric bypass seen otherwise procedure was normal.  Colonoscopy also performed on the same day.  2 sessile polyps 10 to 12 mm were resected colon there were tubular adenomas repeat colonoscopy in 3 years  03/19/2021: H. pylori breath test negative  10/03/2021: Ultrasound abdomen showed small amount of ascites  bilateral pleural effusions.  08/06/2021 echo shows normal study normal LV function  12/12/2021: EGD and colonoscopy performed with biopsies of the stomach and duodenum jejunum: Taken.  No evidence of duodenitis or celiac disease.  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  Terminal ileum appeared normal no evidence of microscopic colitis . No evidence of amyloidosis]  I referred her to [facility] for second opinion to help figure out the cause for the low albumin and they also agreed with my assessment that her liver disease was not the cause of her hypoalbuminemia and effusions suggested endoscopy and colonoscopy to rule out any protein-losing enteropathy which we have performed as above.  The process of illumination the only obvious cause for the low albumin was the gastric bypass Commenced on TPN on 12/23/2021.  Did very well gained her weight back pulmonary edema effusions resolved peripheral edema resolved.     Interval history 05/25/2023-06/28/2023   06/21/2023:Hb 10.1 , ALT 45, AST 33, pre albumin 21, CRP <1 , MG 1.8 , albumin 3.2 . On TPN doing well.  She says that she has gained weight.  Still feels a bit short of breath at times.  Her peripheral edema is decreasing.   Current Outpatient Medications  Medication Sig Dispense Refill   albuterol (VENTOLIN HFA) 108 (90 Base) MCG/ACT inhaler      Ascorbic Acid (VITAMIN C PO) Take 2,000 Units by mouth daily.     azelastine (ASTELIN) 0.1 % nasal spray SPRAY 2 SPRAYS INTO EACH NOSTRIL TWICE A DAY     calcitRIOL (ROCALTROL) 0.5 MCG capsule Take 0.5 mcg by mouth  daily.  5   Cholecalciferol (CVS D3) 125 MCG (5000 UT) capsule Take 1 tablet by mouth daily.     denosumab (PROLIA) 60 MG/ML SOSY injection Inject 60 mg into the skin every 6 (six) months.     fluticasone (FLONASE) 50 MCG/ACT nasal spray Place into both nostrils.     levothyroxine (SYNTHROID) 25 MCG tablet Take 25 mcg by mouth every morning.     loratadine (CLARITIN) 10 MG  tablet Take 1 tablet by mouth daily.     Melatonin 10 MG TABS Take 1 tablet by mouth as needed.     Multiple Vitamin (M.V.I. ADULT IV) Take 1 tablet by mouth daily.     pantoprazole (PROTONIX) 20 MG tablet TAKE 1 TABLET BY MOUTH EVERY DAY 90 tablet 3   Vitamin A 2400 MCG (8000 UT) CAPS Take 1 capsule by mouth daily.     Vitamin E 180 MG (400 UNIT) CAPS Take 1 capsule by mouth daily.     Current Facility-Administered Medications  Medication Dose Route Frequency Provider Last Rate Last Admin   albumin human 25 % solution 25 g  25 g Intravenous Once [name], MD        Allergies as of 06/28/2023 - Review Complete 06/28/2023  Allergen Reaction Noted   Zoledronic acid Other (See Comments) 09/02/2021   Azithromycin Diarrhea 08/15/2014   Milk (cow) Diarrhea 08/15/2014   Prednisone Diarrhea 08/15/2014   Sulfa antibiotics Itching 08/15/2014   Tape Dermatitis 02/24/2018    Review of Systems:    All systems reviewed and negative except where noted in HPI.  General Appearance:    Alert, cooperative, no distress, appears stated age  Head:    Normocephalic, without obvious abnormality, atraumatic  Eyes:    PERRL, conjunctiva/corneas clear,  Ears:    Grossly normal hearing    Neurologic:  Grossly normal    Observations/Objective:  Labs: CMP     Component Value Date/Time   NA 139 06/03/2023 1306   NA 143 05/18/2023 0921   K 3.7 06/03/2023 1306   CL 106 06/03/2023 1306   CO2 25 06/03/2023 1306   GLUCOSE 119 (H) 06/03/2023 1306   BUN 15 06/03/2023 1306   BUN 20 05/18/2023 0921   CREATININE 0.61 06/03/2023 1306   CALCIUM 7.6 (L) 06/03/2023 1306   PROT 4.8 (L) 05/18/2023 0921   ALBUMIN 3.0 (L) 05/18/2023 0921   AST 42 (H) 05/18/2023 0921   ALT 30 05/18/2023 0921   ALKPHOS 86 05/18/2023 0921   BILITOT 0.7 05/18/2023 0921   GFRNONAA >60 06/03/2023 1306   GFRAA >60 05/02/2020 1308   Lab Results  Component Value Date   WBC 5.4 06/03/2023   HGB 11.4 (L) 06/03/2023   HCT 35.1 (L)  06/03/2023   MCV 102.0 (H) 06/03/2023   PLT 202 06/03/2023    Imaging Studies: DG Chest 2 View  Result Date: 06/08/2023 CLINICAL DATA:  PICC line placement. EXAM: CHEST - 2 VIEW COMPARISON:  May 24, 2023. FINDINGS: The heart size and mediastinal contours are within normal limits. Both lungs are clear. Interval placement of right-sided PICC line with distal tip in expected position of SVC. The visualized skeletal structures are unremarkable. IMPRESSION: Interval placement of right-sided PICC line with distal tip in expected position of the SVC. Electronically Signed   By: Lupita Raider M.D.   On: 06/08/2023 12:12   US Venous Img Upper Uni Right(DVT)  Result Date: 06/07/2023 CLINICAL DATA:  Swelling and bruised forearm, upper extremity PICC  line EXAM: RIGHT UPPER EXTREMITY VENOUS DOPPLER ULTRASOUND TECHNIQUE: Gray-scale sonography with graded compression, as well as color Doppler and duplex ultrasound were performed to evaluate the upper extremity deep venous system from the level of the subclavian vein and including the jugular, axillary, basilic, radial, ulnar and upper cephalic vein. Spectral Doppler was utilized to evaluate flow at rest and with distal augmentation maneuvers. COMPARISON:  None Available. FINDINGS: Contralateral Subclavian Vein: Respiratory phasicity is normal and symmetric with the symptomatic side. No evidence of thrombus. Normal compressibility. Internal Jugular Vein: No evidence of thrombus. Normal compressibility, respiratory phasicity and response to augmentation. Subclavian Vein: No evidence of thrombus. Normal compressibility, respiratory phasicity and response to augmentation. Axillary Vein: No evidence of thrombus. Normal compressibility, respiratory phasicity and response to augmentation. Cephalic Vein: No evidence of thrombus. Normal compressibility, respiratory phasicity and response to augmentation. Basilic Vein: No evidence of thrombus. Normal compressibility,  respiratory phasicity and response to augmentation. Brachial Veins: No evidence of thrombus. Normal compressibility, respiratory phasicity and response to augmentation. Radial Veins: No evidence of thrombus. Normal compressibility, respiratory phasicity and response to augmentation. Ulnar Veins: No evidence of thrombus. Normal compressibility, respiratory phasicity and response to augmentation. IMPRESSION: No evidence of significant DVT within the right upper extremity. Electronically Signed   By: Judie Petit.  Shick M.D.   On: 06/07/2023 15:13   [place] EKG SITE RITE  Result Date: 06/03/2023 If Site Rite image not attached, placement could not be confirmed due to current cardiac rhythm.   Assessment and Plan:   [name] is a 71 y.o. y/o female with history of gastric bypass and previously had lost weight gradually over years.  Sent her for a second opinion to [facility].  They had no further input.  I performed EGD colonoscopy no abnormalities noted.  My final impression was that she was losing weight due to the gastric bypass and she was having her protein-losing enteropathy.  We placed her on TPN for about a year she did remarkably well all her peripheral edema, pleural effusions hair loss resolved and she was taken off TPN.  Over the past few months her symptoms are gradually recurring she is developing significant peripheral edema orthopnea.  Restarted her on TPN in October 2024 doing well.       Plan 1.  Suggest to recheck her iron studies B12 CBC if low can be supplemented during her TPN.      Follow-up in 6 to 8 weeks video visit    I discussed the assessment and treatment plan with the patient. The patient was provided an opportunity to ask questions and all were answered. The patient agreed with the plan and demonstrated an understanding of the instructions.   The patient was advised to call back or seek an in-person evaluation if the symptoms worsen or if the condition fails to improve as  anticipated.  I provided 12 minutes of face-to-face time during this encounter.  Dr [name] MD,MRCP [facility]) Gastroenterology/Hepatology Pager: [phone]   Speech recognition software was used to dictate this note.

## 2023-06-29 NOTE — Telephone Encounter (Signed)
Hey Tomi,   Labs actually look better! Hemoglobin improved to 10.2. They did not do her iron levels.   I am happy to see her or if Marcelle Smiling wants to see her but labs are improving per 06/22/23.   Durenda Hurt, NP 06/29/2023 3:53 PM

## 2023-06-29 NOTE — Telephone Encounter (Signed)
Hey Tomi,   Yes me as well.  Do we have the new lab results?  Durenda Hurt, NP 06/29/2023 1:27 PM

## 2023-06-29 NOTE — Telephone Encounter (Signed)
The latest ones, which Rebekah ordered were done on 10/10.  They are in [employer].

## 2023-06-30 ENCOUNTER — Ambulatory Visit: Payer: Medicare Other | Admitting: Physician Assistant

## 2023-06-30 LAB — LAB REPORT - SCANNED: EGFR: 117

## 2023-07-05 NOTE — Progress Notes (Unsigned)
[facility] [address], [place] [address]   CLINIC:  Medical Oncology/Hematology  PCP:  Encarnacion Slates, PA-C [address] [place] [address] [phone]   REASON FOR VISIT:  Follow-up for iron deficiency anemia   PRIOR THERAPY: None   CURRENT THERAPY: Intermittent IV iron (last Feraheme on 05/12/2022 and 05/25/2022)  INTERVAL HISTORY:   [name] 72 y.o. female returns for routine follow-up of her iron deficiency anemia.  She was last evaluated via telemedicine visit by Rojelio Brenner PA-C on 02/19/2023.     At today's visit, she reports feeling fair, but fatigued.   She restarted TPN in September 2024 due to recurrent hypoalbuminemia and associated symptoms of peripheral edema and orthopnea.  (Previously hospitalized in March 2023 (critical hypoalbuminemia, severe vitamin/mineral deficiencies from malabsorption, with fluid overload) and placed on TPN through October 2023.) she continues to follow closely with her gastroenterologist, Dr. [name].   She denies any signs of blood loss such as bright red blood per rectum, melena, epistaxis, or hematuria.  She has some worsening fatigue, dyspnea on exertion, orthopnea, and fatigue over the past month. She has episodes of dizziness related to "dehiscence in her ear."  She denies any pica, restless legs, headaches, chest pain, or syncope.  Her current supplements include TPN, daily copper, vitamin D3, vitamin A, vitamin E, vitamin C, multivitamin.  She has 50% energy and 75% appetite. She endorses that she is maintaining a stable weight.   ASSESSMENT & PLAN:  1.  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 [name]) Currently on TPN, as of September 2024 - SPEP and immunofixation  normal. - History of anastomotic ulcer many years ago - Episode of hematochezia and fecal occult blood positive in September 2018, required PRBC transfusions - Most recent EGD/colonoscopy (12/12/2021): Normal esophagus.  Roux-en-Y gastrojejunostomy with gastrojejunal anastomosis characterized by congestion, edema, and erythema.  Normal colon. - She receives intermittent IV Feraheme, most recently on 05/25/2022.  She reports that her energy improves after IV iron. - She required hospitalization in March 2023 due to severe nutritional deficiencies, and was on TPN from March 2023 through October 2023.  TPN resumed in September 2024. - She continues to take copper supplement daily, but stopped taking folic acid. - Most recent labs (06/24/2023 via [facility]): Hgb 9.6/MCV 104.  Normal WBC and platelets. Ferritin 212, iron saturation 30% Copper normal/improved at 80 - She denies any epistaxis, hematemesis, hematochezia, melena    - Her fatigue has significantly improved, but she tires easily  - PLAN: Recurrent anemia despite normal iron and copper levels. - Will check labs today to include repeat CBC/D, BB sample, B12, MMA, folate.  Will also check reticulocytes, haptoglobin, LDH due to macrocytosis. - Will follow lab results and call patient with further instructions.  We will tentatively schedule her for labs and follow-up visit in 2 months  - She receives weekly labs at [facility] ordered by her GI specialist.  She watches these results closely, and has been instructed to call our office if she has Hgb <9.0.   PLAN SUMMARY: >> Labs today = CBC/D, BB sample, B12, MMA, folate, reticulocytes, haptoglobin, LDH >> Same-day labs (CBC/D, BB sample, ferritin, iron/TIBC) + OFFICE visit in 2 months     REVIEW OF SYSTEMS:  Review of Systems  Constitutional:  Positive for fatigue. Negative for appetite change, chills, diaphoresis, fever and unexpected weight change.  HENT:   Negative for lump/mass and nosebleeds.    Eyes:  Negative for eye problems.  Respiratory:  Positive for shortness of breath. Negative for cough and hemoptysis.   Cardiovascular:  Negative for chest pain, leg swelling and palpitations.  Gastrointestinal:  Positive for nausea and vomiting. Negative for abdominal pain, blood in stool, constipation and diarrhea.  Genitourinary:  Negative for hematuria.   Skin: Negative.   Neurological:  Positive for dizziness. Negative for headaches and light-headedness.  Hematological:  Does not bruise/bleed easily.  Psychiatric/Behavioral:  Positive for sleep disturbance.      PHYSICAL EXAM:  ECOG PERFORMANCE STATUS: 1 - Symptomatic but completely ambulatory  Vitals:   07/06/23 1255  BP: 104/63  Pulse: 78  Temp: 98.1 F (36.7 C)  SpO2: 99%   Physical Exam Constitutional:      Appearance: Normal appearance. She is obese.  Cardiovascular:     Heart sounds: Normal heart sounds.  Pulmonary:     Breath sounds: Normal breath sounds.  Neurological:     General: No focal deficit present.     Mental Status: Mental status is at baseline.  Psychiatric:        Behavior: Behavior normal. Behavior is cooperative.     PAST MEDICAL/SURGICAL HISTORY:  Past Medical History:  Diagnosis Date   Asthma    CHF (congestive heart failure) (HCC)    GERD (gastroesophageal reflux disease)    Hypertension    Internal hemorrhoids    Iron deficiency anemia    Osteoporosis    Peptic ulcer    Tubular adenoma of colon    Vertigo    Past Surgical History:  Procedure Laterality Date   BACK SURGERY     BREAST SURGERY     CHOLECYSTECTOMY     COLONOSCOPY WITH PROPOFOL N/A 06/02/2017   Procedure: COLONOSCOPY WITH PROPOFOL;  Surgeon: [name], MD;  Location[facility] END[facility]COPY;  Service: Gastroenterology;  Laterality: N/A;   COLONOSCOPY WITH PROPOFOL N/A 01/28/2021   Procedure: COLONOSCOPY WITH PROPOFOL;  Surgeon: [name], MD;  Location: [facility] ENDOSCOPY;  Service: Gastroenterology;  Laterality: N/A;    COLONOSCOPY WITH PROPOFOL N/A 12/12/2021   Procedure: COLONOSCOPY WITH PROPOFOL;  Surgeon: [name], MD;  Location: [facility] ENDOSCOPY;  Service: Gastroenterology;  Laterality: N/A;  Patient is requesting to be last since she lives in [place].   ESOPHAGOGASTRODUODENOSCOPY (EGD) WITH PROPOFOL N/A 07/01/2017   Procedure: ESOPHAGOGASTRODUODENOSCOPY (EGD) WITH PROPOFOL;  Surgeon: [name], MD;  Location: [facility] ENDOSCOPY;  Service: Gastroenterology;  Laterality: N/A;   ESOPHAGOGASTRODUODENOSCOPY (EGD) WITH PROPOFOL N/A 01/28/2021   Procedure: ESOPHAGOGASTRODUODENOSCOPY (EGD) WITH PROPOFOL;  Surgeon: [name], MD;  Location: [facility] ENDOSCOPY;  Service: Gastroenterology;  Laterality: N/A;   ESOPHAGOGASTRODUODENOSCOPY (EGD) WITH PROPOFOL N/A 12/12/2021   Procedure: ESOPHAGOGASTRODUODENOSCOPY (EGD) WITH PROPOFOL;  Surgeon: [name], MD;  Location: [facility] ENDOSCOPY;  Service: Gastroenterology;  Laterality: N/A;   FLEXIBLE SIGMOIDOSCOPY N/A 07/01/2017   Procedure: FLEXIBLE SIGMOIDOSCOPY;  Surgeon: [name], MD;  Location: [facility] ENDOSCOPY;  Service: Gastroenterology;  Laterality: N/A;   KNEE SURGERY     mini gastric bypass     SHOULDER SURGERY     TONSILLECTOMY      SOCIAL HISTORY:  Social History   Socioeconomic History   Marital status: Married    Spouse name: Not on file   Number of children: Not on file   Years of education: Not  on file   Highest education level: Not on file  Occupational History   Not on file  Tobacco Use   Smoking status: Never   Smokeless tobacco: Never  Vaping Use   Vaping status: Never Used  Substance and Sexual Activity   Alcohol use: No    Alcohol/week: 0.0 standard drinks of alcohol   Drug use: Never   Sexual activity: Yes  Other Topics Concern   Not on file  Social History Narrative   Not on file   Social Determinants of Health   Financial Resource Strain: Low Risk  (07/13/2022)   Overall Financial Resource Strain (CARDIA)    Difficulty of Paying Living  Expenses: Not hard at all  Food Insecurity: No Food Insecurity (07/13/2022)   Hunger Vital Sign    Worried About Running Out of Food in the Last Year: Never true    Ran Out of Food in the Last Year: Never true  Transportation Needs: No Transportation Needs (07/13/2022)   PRAPARE - Administrator, Civil Service (Medical): No    Lack of Transportation (Non-Medical): No  Physical Activity: Inactive (07/13/2022)   Exercise Vital Sign    Days of Exercise per Week: 0 days    Minutes of Exercise per Session: 0 min  Stress: No Stress Concern Present (07/13/2022)   [employer] of Occupational Health - Occupational Stress Questionnaire    Feeling of Stress : Only a little  Social Connections: Socially Integrated (07/13/2022)   Social Connection and Isolation Panel [NHANES]    Frequency of Communication with Friends and Family: More than three times a week    Frequency of Social Gatherings with Friends and Family: More than three times a week    Attends Religious Services: More than 4 times per year    Active Member of [employer] or Organizations: Yes    Attends Engineer, structural: More than 4 times per year    Marital Status: Married  Catering manager Violence: Not At Risk (07/13/2022)   Humiliation, Afraid, Rape, and Kick questionnaire    Fear of Current or Ex-Partner: No    Emotionally Abused: No    Physically Abused: No    Sexually Abused: No    FAMILY HISTORY:  Family History  Problem Relation Age of Onset   Breast cancer Paternal Grandmother    Diabetes Father    Heart disease Father    Colon cancer Father    Brain cancer Mother    Healthy Daughter    Hypertension Other    Stroke Other    Diabetes Other    Heart attack Other    Obesity Other    Stomach cancer Neg Hx    Pancreatic cancer Neg Hx     CURRENT MEDICATIONS:  Outpatient Encounter Medications as of 07/06/2023  Medication Sig   albuterol (VENTOLIN HFA) 108 (90 Base) MCG/ACT inhaler     Ascorbic Acid (VITAMIN C PO) Take 2,000 Units by mouth daily.   azelastine (ASTELIN) 0.1 % nasal spray SPRAY 2 SPRAYS INTO EACH NOSTRIL TWICE A DAY   calcitRIOL (ROCALTROL) 0.5 MCG capsule Take 0.5 mcg by mouth daily.   Cholecalciferol ([employer] D3) 125 MCG (5000 UT) capsule Take 1 tablet by mouth daily.   denosumab (PROLIA) 60 MG/ML SOSY injection Inject 60 mg into the skin every 6 (six) months.   fluticasone (FLONASE) 50 MCG/ACT nasal spray Place into both nostrils.   levothyroxine (SYNTHROID) 25 MCG tablet Take 25 mcg by mouth every morning.  loratadine (CLARITIN) 10 MG tablet Take 1 tablet by mouth daily.   Melatonin 10 MG TABS Take 1 tablet by mouth as needed.   Multiple Vitamin (M.V.I. ADULT IV) Take 1 tablet by mouth daily.   pantoprazole (PROTONIX) 20 MG tablet TAKE 1 TABLET BY MOUTH EVERY DAY   Vitamin A 2400 MCG (8000 UT) CAPS Take 1 capsule by mouth daily.   Vitamin E 180 MG (400 UNIT) CAPS Take 1 capsule by mouth daily.   Facility-Administered Encounter Medications as of 07/06/2023  Medication   albumin human 25 % solution 25 g    ALLERGIES:  Allergies  Allergen Reactions   Zoledronic Acid Other (See Comments)    Other reaction(s): fever and flu-like symptoms   Azithromycin Diarrhea    Other reaction(s): diarrhea   Milk (Cow) Diarrhea   Prednisone Diarrhea    Other Reaction(s): GI Intolerance   Sulfa Antibiotics Itching   Tape Dermatitis    LABORATORY DATA:  I have reviewed the labs as listed.  CBC    Component Value Date/Time   WBC 5.4 06/03/2023 1306   RBC 3.44 (L) 06/03/2023 1306   HGB 11.4 (L) 06/03/2023 1306   HGB 11.2 05/18/2023 0921   HCT 35.1 (L) 06/03/2023 1306   HCT 34.2 05/18/2023 0921   PLT 202 06/03/2023 1306   PLT 215 05/18/2023 0921   MCV 102.0 (H) 06/03/2023 1306   MCV 100 (H) 05/18/2023 0921   MCH 33.1 06/03/2023 1306   MCHC 32.5 06/03/2023 1306   RDW 13.5 06/03/2023 1306   RDW 12.6 05/18/2023 0921   LYMPHSABS 1.4 05/18/2023 0921   MONOABS  0.5 02/09/2023 1431   EOSABS 0.0 05/18/2023 0921   BASOSABS 0.0 05/18/2023 0921      Latest Ref Rng & Units 06/03/2023    1:06 PM 05/18/2023    9:21 AM 04/19/2023   12:00 PM  CMP  Glucose 70 - 99 mg/dL 188  57  416   BUN 8 - 23 mg/dL 15  20  13    Creatinine 0.44 - 1.00 mg/dL 6.06  3.01  6.01   Sodium 135 - 145 mmol/L 139  143  142   Potassium 3.5 - 5.1 mmol/L 3.7  3.8  4.4   Chloride 98 - 111 mmol/L 106  106  104   CO2 22 - 32 mmol/L 25  22  25    Calcium 8.9 - 10.3 mg/dL 7.6  7.4  8.0   Total Protein 6.0 - 8.5 g/dL  4.8  4.6   Total Bilirubin 0.0 - 1.2 mg/dL  0.7  0.6   Alkaline Phos 44 - 121 IU/L  86  102   AST 0 - 40 IU/L  42  40   ALT 0 - 32 IU/L  30  36     DIAGNOSTIC IMAGING:  I have independently reviewed the relevant imaging and discussed with the patient.   WRAP UP:  All questions were answered. The patient knows to call the clinic with any problems, questions or concerns.  Medical decision making: Moderate  Time spent on visit: I spent 20 minutes counseling the patient face to face. The total time spent in the appointment was 30 minutes and more than 50% was on counseling.  Carnella Guadalajara, PA-C  07/06/23 1:41 PM

## 2023-07-06 ENCOUNTER — Inpatient Hospital Stay: Payer: Medicare Other

## 2023-07-06 ENCOUNTER — Inpatient Hospital Stay: Payer: Medicare Other | Attending: Physician Assistant | Admitting: Physician Assistant

## 2023-07-06 VITALS — BP 104/63 | HR 78 | Temp 98.1°F

## 2023-07-06 DIAGNOSIS — D500 Iron deficiency anemia secondary to blood loss (chronic): Secondary | ICD-10-CM

## 2023-07-06 DIAGNOSIS — K909 Intestinal malabsorption, unspecified: Secondary | ICD-10-CM

## 2023-07-06 DIAGNOSIS — D539 Nutritional anemia, unspecified: Secondary | ICD-10-CM

## 2023-07-06 DIAGNOSIS — D509 Iron deficiency anemia, unspecified: Secondary | ICD-10-CM | POA: Insufficient documentation

## 2023-07-06 DIAGNOSIS — E610 Copper deficiency: Secondary | ICD-10-CM | POA: Diagnosis not present

## 2023-07-06 DIAGNOSIS — E538 Deficiency of other specified B group vitamins: Secondary | ICD-10-CM | POA: Insufficient documentation

## 2023-07-06 LAB — FOLATE: Folate: 17.2 ng/mL (ref >5.9)

## 2023-07-06 LAB — CBC WITH DIFFERENTIAL/PLATELET
Abs Immature Granulocytes: 0.02 10*3/uL (ref 0.00–0.07)
Basophils Absolute: 0 10*3/uL (ref 0.0–0.1)
Basophils Relative: 1 %
Eosinophils Absolute: 0.1 10*3/uL (ref 0.0–0.5)
Eosinophils Relative: 2 %
HCT: 34.2 % — ABNORMAL LOW (ref 36.0–46.0)
Hemoglobin: 10.7 g/dL — ABNORMAL LOW (ref 12.0–15.0)
Immature Granulocytes: 0 %
Lymphocytes Relative: 24 %
Lymphs Abs: 1.1 10*3/uL (ref 0.7–4.0)
MCH: 32.1 pg (ref 26.0–34.0)
MCHC: 31.3 g/dL (ref 30.0–36.0)
MCV: 102.7 fL — ABNORMAL HIGH (ref 80.0–100.0)
Monocytes Absolute: 0.3 10*3/uL (ref 0.1–1.0)
Monocytes Relative: 7 %
Neutro Abs: 3 10*3/uL (ref 1.7–7.7)
Neutrophils Relative %: 66 %
Platelets: 195 10*3/uL (ref 150–400)
RBC: 3.33 MIL/uL — ABNORMAL LOW (ref 3.87–5.11)
RDW: 13 % (ref 11.5–15.5)
WBC: 4.7 10*3/uL (ref 4.0–10.5)
nRBC: 0 % (ref 0.0–0.2)

## 2023-07-06 LAB — VITAMIN B12: Vitamin B-12: 605 pg/mL (ref 180–914)

## 2023-07-06 LAB — RETICULOCYTES
Immature Retic Fract: 19.1 % — ABNORMAL HIGH (ref 2.3–15.9)
RBC.: 3.31 MIL/uL — ABNORMAL LOW (ref 3.87–5.11)
Retic Count, Absolute: 158.5 10*3/uL (ref 19.0–186.0)
Retic Ct Pct: 4.8 % — ABNORMAL HIGH (ref 0.4–3.1)

## 2023-07-06 LAB — SAMPLE TO BLOOD BANK

## 2023-07-06 LAB — LACTATE DEHYDROGENASE: LDH: 162 U/L (ref 98–192)

## 2023-07-06 MED ORDER — HEPARIN SOD (PORK) LOCK FLUSH 100 UNIT/ML IV SOLN
500.0000 [IU] | Freq: Once | INTRAVENOUS | Status: AC
  Administered 2023-07-06: 500 [IU] via INTRAVENOUS

## 2023-07-06 MED ORDER — SODIUM CHLORIDE 0.9% FLUSH
10.0000 mL | Freq: Once | INTRAVENOUS | Status: AC
  Administered 2023-07-06: 10 mL via INTRAVENOUS

## 2023-07-06 NOTE — Patient Instructions (Signed)
[facility]  Discharge Instructions: Thank you for choosing [facility] to provide your oncology and hematology care.  If you have a lab appointment with the Cancer Center - please note that after April 8th, 2024, all labs will be drawn in the cancer center.  You do not have to check in or register with the main entrance as you have in the past but will complete your check-in in the cancer center.  Wear comfortable clothing and clothing appropriate for easy access to any Portacath or PICC line.   We strive to give you quality time with your provider. You may need to reschedule your appointment if you arrive late (15 or more minutes).  Arriving late affects you and other patients whose appointments are after yours.  Also, if you miss three or more appointments without notifying the office, you may be dismissed from the clinic at the provider's discretion.      For prescription refill requests, have your pharmacy contact our office and allow 72 hours for refills to be completed.    Today you received the following chemotherapy and/or immunotherapy agents PICC flush and blood draw.      To help prevent nausea and vomiting after your treatment, we encourage you to take your nausea medication as directed.  BELOW ARE SYMPTOMS THAT SHOULD BE REPORTED IMMEDIATELY: *FEVER GREATER THAN 100.4 F (38 C) OR HIGHER *CHILLS OR SWEATING *NAUSEA AND VOMITING THAT IS NOT CONTROLLED WITH YOUR NAUSEA MEDICATION *UNUSUAL SHORTNESS OF BREATH *UNUSUAL BRUISING OR BLEEDING *URINARY PROBLEMS (pain or burning when urinating, or frequent urination) *BOWEL PROBLEMS (unusual diarrhea, constipation, pain near the anus) TENDERNESS IN MOUTH AND THROAT WITH OR WITHOUT PRESENCE OF ULCERS (sore throat, sores in mouth, or a toothache) UNUSUAL RASH, SWELLING OR PAIN  UNUSUAL VAGINAL DISCHARGE OR ITCHING   Items with * indicate a potential emergency and should be followed up as soon as  possible or go to the Emergency Department if any problems should occur.  Please show the CHEMOTHERAPY ALERT CARD or IMMUNOTHERAPY ALERT CARD at check-in to the Emergency Department and triage nurse.  Should you have questions after your visit or need to cancel or reschedule your appointment, please contact [facility] [phone]  and follow the prompts.  Office hours are 8:00 a.m. to 4:30 p.m. Monday - Friday. Please note that voicemails left after 4:00 p.m. may not be returned until the following business day.  We are closed weekends and major holidays. You have access to a nurse at all times for urgent questions. Please call the main number to the clinic [phone] and follow the prompts.  For any non-urgent questions, you may also contact your provider using MyChart. We now offer e-Visits for anyone 39 and older to request care online for non-urgent symptoms. For details visit [email or web address].   Also download the MyChart app! Go to the app store, search "MyChart", open the app, select [place], and log in with your MyChart username and password.

## 2023-07-06 NOTE — Patient Instructions (Signed)
[facility] **VISIT SUMMARY & IMPORTANT INSTRUCTIONS **   You were seen today by Rojelio Brenner PA-C for your anemia.   Your blood levels are lower than what they were at your previous visit. We will check additional labs today to determine why your blood levels are dropping, but I suspect that you may have some vitamin or mineral deficiencies related to your malabsorption syndrome. We will call or message you next week with further instructions based on your lab results. We will plan on seeing you for same-day labs and follow-up visit in 2 months.  LABS: Continue weekly labs at [facility] as ordered by Dr. Tobi Bastos.  Please notify our office if you have hemoglobin <9.0  ** Thank you for trusting me with your healthcare!  I strive to provide all of my patients with quality care at each visit.  If you receive a survey for this visit, I would be so grateful to you for taking the time to provide feedback.  Thank you in advance!  ~ Jerrett Baldinger                   Dr. Doreatha Massed   &   Rojelio Brenner, PA-C   - - - - - - - - - - - - - - - - - -    Thank you for choosing [facility] at [facility] to provide your oncology and hematology care.  To afford each patient quality time with our provider, please arrive at least 15 minutes before your scheduled appointment time.   If you have a lab appointment with the Cancer Center please come in thru the Main Entrance and check in at the main information desk.  You need to re-schedule your appointment should you arrive 10 or more minutes late.  We strive to give you quality time with our providers, and arriving late affects you and other patients whose appointments are after yours.  Also, if you no show three or more times for appointments you may be dismissed from the clinic at the providers discretion.     Again, thank you for choosing [facility].  Our hope is that these requests  will decrease the amount of time that you wait before being seen by our physicians.       _____________________________________________________________  Should you have questions after your visit to [facility], please contact our office at [phone] and follow the prompts.  Our office hours are 8:00 a.m. and 4:30 p.m. Monday - Friday.  Please note that voicemails left after 4:00 p.m. may not be returned until the following business day.  We are closed weekends and major holidays.  You do have access to a nurse 24-7, just call the main number to the clinic [phone] and do not press any options, hold on the line and a nurse will answer the phone.    For prescription refill requests, have your pharmacy contact our office and allow 72 hours.

## 2023-07-06 NOTE — Progress Notes (Signed)
[name] presented for PICC flush and blood draw.  See IV assessment in docflowsheets for PICC details.  PICC located right arm.  Good blood return present. PICC flushed with 20ml NS and 250U Heparin, see MAR for further details.  [name] tolerated procedure well and without incident.

## 2023-07-07 LAB — LAB REPORT - SCANNED: EGFR: 102

## 2023-07-07 LAB — HAPTOGLOBIN: Haptoglobin: 31 mg/dL — ABNORMAL LOW (ref 37–355)

## 2023-07-09 LAB — METHYLMALONIC ACID, SERUM: Methylmalonic Acid, Quantitative: 162 nmol/L (ref 0–378)

## 2023-07-09 LAB — LAB REPORT - SCANNED: EGFR: 102

## 2023-07-13 LAB — LAB REPORT - SCANNED: EGFR: 121

## 2023-07-14 ENCOUNTER — Other Ambulatory Visit: Payer: Self-pay | Admitting: Physician Assistant

## 2023-07-14 ENCOUNTER — Encounter: Payer: Self-pay | Admitting: Physician Assistant

## 2023-07-14 DIAGNOSIS — D539 Nutritional anemia, unspecified: Secondary | ICD-10-CM

## 2023-07-14 NOTE — Progress Notes (Signed)
REVIEW OF LABS from 07/06/2023 as follows: Hgb improved somewhat at 10.7 Normal B12, MMA, folate. Although haptoglobin was mildly low at 31, LDH was normal.  She may have had abnormally low haptoglobin due to her protein-losing enteropathy.  I am less suspicious of hemolysis at this time, but we will recheck LDH, reticulocytes, haptoglobin, fractionated bilirubin, and DAT with next labs. Reticulocytes mildly elevated 4.8%, showing adequate compensation for anemia.  Question that she may have had some blood loss that was not yet apparent in her iron panel.  Patient scheduled for labs and office visit on 09/20/2023.   Labs = CBC/D, BB sample, ferritin, iron/TIBC, LDH, reticulocytes, haptoglobin, fractionated bilirubin, DAT/[place]  Carnella Guadalajara, PA-C 07/14/23 4:42 PM

## 2023-07-27 ENCOUNTER — Encounter: Payer: Self-pay | Admitting: Gastroenterology

## 2023-08-09 ENCOUNTER — Ambulatory Visit (INDEPENDENT_AMBULATORY_CARE_PROVIDER_SITE_OTHER): Payer: Medicare Other | Admitting: Obstetrics & Gynecology

## 2023-08-09 ENCOUNTER — Encounter: Payer: Self-pay | Admitting: Obstetrics & Gynecology

## 2023-08-09 VITALS — BP 101/72 | HR 80 | Ht 64.0 in | Wt 187.2 lb

## 2023-08-09 DIAGNOSIS — N83202 Unspecified ovarian cyst, left side: Secondary | ICD-10-CM | POA: Diagnosis not present

## 2023-08-09 DIAGNOSIS — R102 Pelvic and perineal pain: Secondary | ICD-10-CM

## 2023-08-09 DIAGNOSIS — Z1231 Encounter for screening mammogram for malignant neoplasm of breast: Secondary | ICD-10-CM | POA: Diagnosis not present

## 2023-08-09 NOTE — Progress Notes (Signed)
WELL-WOMAN EXAMINATION Patient name: [name] [place] MRN [id number]  Date of birth: 1951-07-31 Chief Complaint:   Gynecologic Exam  History of Present Illness:   [name] [place] is a 72 y.o. G58P1011 PM female being seen today for a routine well-woman exam.   Today she notes that over the past couple of months has had cramping pain on her right side.  Rates pain 5-6/10- sometimes pain will last a few minutes- other times an hour or so. Denies radiation of her pain, not sure about aggravating symptoms.  While she has had some decreased appetite and bloating.  She is also being treat for malnutrition (similar symptoms) including decreased energy and fatigue.  Pt has PICC line for supportive care.  Also notes weight gain despite decreased appetite.  Left ovarian cyst: Known cyst since 2016. Mentioned in CT on 2018  Prior US reviewed: 05/2022: 6x3.6x6cm septate cyst in left adnexa MRI 06/2022: Left adnexal lesion- 5.8x 3.1cm.  In 2018 cyst measured ~ 5cm in size.  No LMP recorded. Patient is postmenopausal.  The current method of family planning is post menopausal status.    Last pap 2023.  Last mammogram: to be completed. Last colonoscopy: 2023     08/09/2023   11:20 AM 07/13/2022    9:29 AM 09/25/2016    1:48 PM 12/17/2015    9:23 AM  Depression screen PHQ 2/9  Decreased Interest 0 0 0 0  Down, Depressed, Hopeless 0 0 0 0  PHQ - 2 Score 0 0 0 0  Altered sleeping 0 1    Tired, decreased energy 1 2    Change in appetite 2 1    Feeling bad or failure about yourself  0 0    Trouble concentrating 0 0    Moving slowly or fidgety/restless 0 1    Suicidal thoughts 0 0    PHQ-9 Score 3 5        Review of Systems:   Pertinent items are noted in HPI Denies any headaches, blurred vision, fatigue, shortness of breath, chest pain, Denies change in her bowel movements, urination, or intercourse unless otherwise stated above.  Pertinent History Reviewed:  Reviewed past  medical,surgical, social and family history.  Reviewed problem list, medications and allergies. Physical Assessment:   Vitals:   08/09/23 1120  BP: 101/72  Pulse: 80  Weight: 187 lb 3.2 oz (84.9 kg)  Height: 5\' 4"  (1.626 m)  Body mass index is 32.13 kg/m.        Physical Examination:   General appearance - well appearing, and in no distress  Mental status - alert, oriented to person, place, and time  Psych:  She has a normal mood and affect  Skin - warm and dry, normal color, no suspicious lesions noted  Chest - effort normal, all lung fields clear to auscultation bilaterally  Heart - normal rate and regular rhythm  Neck:  midline trachea, no thyromegaly or nodules  Breasts - breasts appear normal, no suspicious masses, no skin or nipple changes or  axillary nodes  Abdomen - soft, nontender, nondistended, no masses or organomegaly  Pelvic - VULVA: normal appearing vulva with no masses, tenderness or lesions  VAGINA: normal appearing vagina with normal color and discharge, no lesions  CERVIX: normal appearing cervix without discharge or lesions, no CMT  UTERUS: uterus is felt to be normal size, shape, consistency and nontender   ADNEXA: No adnexal masses or tenderness noted.  Extremities:  No swelling or varicosities noted  Chaperone:  Dr. Ardyth Harps      Assessment & Plan:  1) Left ovarian cyst, pelvic pain -based on medical history and current symptoms do not think the ovarian cyst is causing her pain.   -plan for pelvic US at next available for continue surveillance of cyst -in review cyst has been present since 2018 -pt would prefer conservative management should cyst not appear malignant  []  further management pending results of upcoming cyst - pt ok with call regarding results  2) Preventive screening -mammogram ordered  Orders Placed This Encounter  Procedures   US PELVIC COMPLETE WITH TRANSVAGINAL    Meds: No orders of the defined types were placed in this  encounter.   Follow-up: Return for scheduled [place] at next avilable (with [place]).   Myna Hidalgo, DO Attending Obstetrician & Gynecologist, [facility] for [facility], [facility]  4

## 2023-08-16 ENCOUNTER — Telehealth: Payer: Medicare Other | Admitting: Gastroenterology

## 2023-08-16 DIAGNOSIS — E8809 Other disorders of plasma-protein metabolism, not elsewhere classified: Secondary | ICD-10-CM | POA: Diagnosis not present

## 2023-08-16 DIAGNOSIS — Z789 Other specified health status: Secondary | ICD-10-CM | POA: Diagnosis not present

## 2023-08-16 NOTE — Progress Notes (Signed)
[name] , MD [address]  [address]  [place], [place] [address]  Main: [phone]  Fax: [phone]   Primary Care Physician: Encarnacion Slates, PA-C  Virtual Visit via Video Note  I connected with patient on 08/16/23 at  2:00 PM EST by video and verified that I am speaking with the correct person using two identifiers.   I discussed the limitations, risks, security and privacy concerns of performing an evaluation and management service by video  and the availability of in person appointments. I also discussed with the patient that there may be a patient responsible charge related to this service. The patient expressed understanding and agreed to proceed.  Location of Patient: Home Location of Provider: Home Persons involved: Patient and provider only   History of Present Illness: Chief Complaint  Patient presents with   hypoalbumenia    HPI: [name] is a 72 y.o. female     Summary of history :   Initially referred and seen on 01/08/2021 for vomiting after eating.  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 2019 she was seen by [facility] and underwent hemorrhoidal banding.  .Does complain of heartburn despite being on famotidine.  Previously was on pantoprazole.  Seen by the cancer center for iron deficiency anemia in March 2022 hemoglobin 13.1 g has received IV iron.   01/08/2021: Tested positive H. pylori.  Treated with quadruple based bismuth regimen. 02/04/2021: Hemoglobin 13.5 g, ferritin 213, B12 normal, folate normal, CMP normal 01/28/2021: EGD: Features of gastric bypass seen otherwise procedure was normal.  Colonoscopy also performed on the same day.  2 sessile polyps 10 to 12 mm were resected colon there were tubular adenomas repeat colonoscopy in 3 years  03/19/2021: H. pylori breath test negative  10/03/2021: Ultrasound abdomen showed small amount of ascites  bilateral pleural effusions.  08/06/2021 echo shows normal study normal LV function  12/12/2021: EGD and colonoscopy performed with biopsies of the stomach and duodenum jejunum: Taken.  No evidence of duodenitis or celiac disease.  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  Terminal ileum appeared normal no evidence of microscopic colitis . No evidence of amyloidosis]  I referred [facility] to [facility] for second opinion to help figure out the cause for the low albumin and they also agreed with my assessment that her liver disease was not the cause of her hypoalbuminemia and effusions suggested endoscopy and colonoscopy to rule out any protein-losing enteropathy which we have performed as above.  The process of illumination the only obvious cause for the low albumin was the gastric bypass Commenced on TPN on 12/23/2021.  Did very well gained her weight back pulmonary edema effusions resolved peripheral edema resolved.   06/21/2023:Hb 10.1 , ALT 45, AST 33, pre albumin 21, CRP <1 , MG 1.8 , albumin 3.2 .    Interval history 06/28/2023 -08/16/2023  08/09/2023: Hb 12.1 , albumin 3.2 , AST/ALT - normal.    On TPN doing well.  She has gained about 15 to 20 pounds recently.  She is concerned about weight gain.  She is she is able to lay flat no difficulty breathing, leg swelling has decreased.  She has brought it up to her dietitian was reduced the amount of feed she is receiving.  Also complains of abdominal bloating.  Consumes equal.  Denies any constipation.  Not related to meals.  Not improved with defecation.  On  pantoprazole 20 mg a day.     Current Outpatient Medications  Medication Sig Dispense Refill   albuterol (VENTOLIN HFA) 108 (90 Base) MCG/ACT inhaler      Ascorbic Acid (VITAMIN C PO) Take 2,000 Units by mouth daily.     azelastine (ASTELIN) 0.1 % nasal spray      calcitRIOL (ROCALTROL) 0.5 MCG capsule Take 0.5 mcg by mouth daily.  5   Cholecalciferol ([employer] D3) 125 MCG (5000 UT)  capsule Take 1 tablet by mouth daily.     denosumab (PROLIA) 60 MG/ML SOSY injection Inject 60 mg into the skin every 6 (six) months.     fluticasone (FLONASE) 50 MCG/ACT nasal spray Place into both nostrils.     levothyroxine (SYNTHROID) 25 MCG tablet Take 25 mcg by mouth every morning.     loratadine (CLARITIN) 10 MG tablet Take 1 tablet by mouth daily.     Melatonin 10 MG TABS Take 1 tablet by mouth as needed.     Multiple Vitamin (M.V.I. ADULT IV) Take 1 tablet by mouth daily.     pantoprazole (PROTONIX) 20 MG tablet TAKE 1 TABLET BY MOUTH EVERY DAY 90 tablet 3   Vitamin A 2400 MCG (8000 UT) CAPS Take 1 capsule by mouth daily.     Vitamin E 180 MG (400 UNIT) CAPS Take 1 capsule by mouth daily.     Current Facility-Administered Medications  Medication Dose Route Frequency Provider Last Rate Last Admin   albumin human 25 % solution 25 g  25 g Intravenous Once [name], MD        Allergies as of 08/16/2023 - Review Complete 08/16/2023  Allergen Reaction Noted   Zoledronic acid Other (See Comments) 09/02/2021   Azithromycin Diarrhea 08/15/2014   Milk (cow) Diarrhea 08/15/2014   Prednisone Diarrhea 08/15/2014   Sulfa antibiotics Itching 08/15/2014   Tape Dermatitis 02/24/2018    Review of Systems:    All systems reviewed and negative except where noted in HPI.  General Appearance:    Alert, cooperative, no distress, appears stated age  Head:    Normocephalic, without obvious abnormality, atraumatic  Eyes:    PERRL, conjunctiva/corneas clear,  Ears:    Grossly normal hearing    Neurologic:  Grossly normal    Observations/Objective:  Labs: CMP     Component Value Date/Time   NA 139 06/03/2023 1306   NA 143 05/18/2023 0921   K 3.7 06/03/2023 1306   CL 106 06/03/2023 1306   CO2 25 06/03/2023 1306   GLUCOSE 119 (H) 06/03/2023 1306   BUN 15 06/03/2023 1306   BUN 20 05/18/2023 0921   CREATININE 0.61 06/03/2023 1306   CALCIUM 7.6 (L) 06/03/2023 1306   PROT 4.8 (L)  05/18/2023 0921   ALBUMIN 3.0 (L) 05/18/2023 0921   AST 42 (H) 05/18/2023 0921   ALT 30 05/18/2023 0921   ALKPHOS 86 05/18/2023 0921   BILITOT 0.7 05/18/2023 0921   GFRNONAA >60 06/03/2023 1306   GFRAA >60 05/02/2020 1308   Lab Results  Component Value Date   WBC 4.7 07/06/2023   HGB 10.7 (L) 07/06/2023   HCT 34.2 (L) 07/06/2023   MCV 102.7 (H) 07/06/2023   PLT 195 07/06/2023    Imaging Studies: No results found.  Assessment and Plan:   [name] is a 72 y.o. 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  This is  her second round of TPN after the holiday for over a year.  Main concerns at this point of time are weight gain which is very likely due to the feed.  Based on her history she does not have orthopnea or PND or pedal edema making it less likely fluid overload and her albumin levels are also adequate.  She complains of bloating and abdominal discomfort which is possibly due to gas from consuming equal.  Advised her to increase her pantoprazole to 40 mg a day and stop equal and use regular sugar and update me in a week's time.  If her symptoms persist then will need an office visit to examine her and discuss about endoscopic evaluation or imaging.  Her present symptoms have only been going on for a few weeks not longer.  In addition I will check her for selenium, niacin and zinc deficiency  She will send me a MyChart message within a week to tell me if she is not doing better then we will see her at the office      I discussed the assessment and treatment plan with the patient. The patient was provided an opportunity to ask questions and all were answered. The patient agreed with the plan and demonstrated an understanding of the instructions.   The patient was advised to call back or seek an in-person evaluation if the symptoms worsen or if the condition fails to  improve as anticipated.  I provided 12 minutes of face-to-face time during this encounter.  Dr [name] MD,MRCP [facility]) Gastroenterology/Hepatology Pager: [phone]   Speech recognition software was used to dictate this note.

## 2023-08-24 ENCOUNTER — Encounter: Payer: Self-pay | Admitting: Gastroenterology

## 2023-09-01 ENCOUNTER — Encounter: Payer: Self-pay | Admitting: Gastroenterology

## 2023-09-01 ENCOUNTER — Ambulatory Visit (INDEPENDENT_AMBULATORY_CARE_PROVIDER_SITE_OTHER): Payer: Medicare Other | Admitting: Gastroenterology

## 2023-09-01 VITALS — BP 110/69 | HR 75 | Temp 97.9°F | Wt 191.6 lb

## 2023-09-01 DIAGNOSIS — R109 Unspecified abdominal pain: Secondary | ICD-10-CM | POA: Diagnosis not present

## 2023-09-01 DIAGNOSIS — E46 Unspecified protein-calorie malnutrition: Secondary | ICD-10-CM

## 2023-09-01 DIAGNOSIS — R1084 Generalized abdominal pain: Secondary | ICD-10-CM

## 2023-09-01 DIAGNOSIS — K5989 Other specified functional intestinal disorders: Secondary | ICD-10-CM | POA: Diagnosis not present

## 2023-09-01 DIAGNOSIS — Z789 Other specified health status: Secondary | ICD-10-CM

## 2023-09-01 DIAGNOSIS — R140 Abdominal distension (gaseous): Secondary | ICD-10-CM

## 2023-09-01 MED ORDER — SUCRALFATE 1 GM/10ML PO SUSP: 1.0000 g | Freq: Four times a day (QID) | ORAL | 11 refills | Status: AC

## 2023-09-01 NOTE — Progress Notes (Signed)
[name] MD, MRCP(U.K) [address]  [address]  [place], [place] [address]  Main: [phone]  Fax: [phone]   Primary Care Physician: Encarnacion Slates, PA-C  Primary Gastroenterologist:  Dr. [name]   Chief Complaint  Patient presents with   On TPN    HPI: [name] is a 72 y.o. female Summary of history :   Initially referred and seen on 01/08/2021 for vomiting after eating.  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 2019 she was seen by [facility] and underwent hemorrhoidal banding.  .Does complain of heartburn despite being on famotidine.  Previously was on pantoprazole.  Seen by the cancer center for iron deficiency anemia in March 2022 hemoglobin 13.1 g has received IV iron.   01/08/2021: Tested positive H. pylori.  Treated with quadruple based bismuth regimen. 02/04/2021: Hemoglobin 13.5 g, ferritin 213, B12 normal, folate normal, CMP normal 01/28/2021: EGD: Features of gastric bypass seen otherwise procedure was normal.  Colonoscopy also performed on the same day.  2 sessile polyps 10 to 12 mm were resected colon there were tubular adenomas repeat colonoscopy in 3 years  03/19/2021: H. pylori breath test negative  10/03/2021: Ultrasound abdomen showed small amount of ascites bilateral pleural effusions.  08/06/2021 echo shows normal study normal LV function  12/12/2021: EGD and colonoscopy performed with biopsies of the stomach and duodenum jejunum: Taken.  No evidence of duodenitis or celiac disease.  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  Terminal ileum appeared normal no evidence of microscopic colitis . No evidence of amyloidosis]  I referred her to [facility] for second opinion to help figure out the cause for the low albumin and they also agreed with my assessment that her liver disease was not the cause of her hypoalbuminemia and effusions  suggested endoscopy and colonoscopy to rule out any protein-losing enteropathy which we have performed as above.  The process of illumination the only obvious cause for the low albumin was the gastric bypass Commenced on TPN on 12/23/2021.  Did very well gained her weight back pulmonary edema effusions resolved peripheral edema resolved.   06/21/2023:Hb 10.1 , ALT 45, AST 33, pre albumin 21, CRP <1 , MG 1.8 , albumin 3.2 .  08/09/2023: Hb 12.1 , albumin 3.2 , AST/ALT - normal.      Interval history 08/16/2023-09/01/2023   08/17/2023: AST 58, ALT 86 08/23/2023: AST 80,ALT 125  Gained close to 20 lbs since 05/2023.  She says she has has epigastric pain when she eats similar to what she had when she had an anastomotic ulcer in the past taking her Protonix twice a day.  Denies any NSAID use.  Denies any constipation.  Complains of abdominal distention.  Denies any orthopnea denies any paroxysmal nocturnal dyspnea denies any shortness of breath.  Her legs are swollen but it has improved.     Current Outpatient Medications  Medication Sig Dispense Refill   albuterol (VENTOLIN HFA) 108 (90 Base) MCG/ACT inhaler      Ascorbic Acid (VITAMIN C PO) Take 2,000 Units by mouth daily.     azelastine (ASTELIN) 0.1 % nasal spray      calcitRIOL (ROCALTROL) 0.5 MCG capsule Take 0.5 mcg by mouth daily.  5   Cholecalciferol (CVS D3) 125 MCG (5000 UT) capsule Take 1 tablet by mouth daily.     denosumab (PROLIA) 60 MG/ML SOSY injection Inject 60 mg into the  skin every 6 (six) months.     fluticasone (FLONASE) 50 MCG/ACT nasal spray Place into both nostrils.     levothyroxine (SYNTHROID) 25 MCG tablet Take 25 mcg by mouth every morning.     loratadine (CLARITIN) 10 MG tablet Take 1 tablet by mouth daily.     Melatonin 10 MG TABS Take 1 tablet by mouth as needed.     Multiple Vitamin (M.V.I. ADULT IV) Take 1 tablet by mouth daily.     pantoprazole (PROTONIX) 20 MG tablet TAKE 1 TABLET BY MOUTH EVERY DAY 90 tablet 3    sucralfate (CARAFATE) 1 GM/10ML suspension Take 10 mLs (1 g total) by mouth 4 (four) times daily. 1200 mL 11   Vitamin A 2400 MCG (8000 UT) CAPS Take 1 capsule by mouth daily.     Vitamin E 180 MG (400 UNIT) CAPS Take 1 capsule by mouth daily.     Current Facility-Administered Medications  Medication Dose Route Frequency Provider Last Rate Last Admin   albumin human 25 % solution 25 g  25 g Intravenous Once [name], MD        Allergies as of 09/01/2023 - Review Complete 09/01/2023  Allergen Reaction Noted   Zoledronic acid Other (See Comments) 09/02/2021   Azithromycin Diarrhea 08/15/2014   Milk (cow) Diarrhea 08/15/2014   Prednisone Diarrhea 08/15/2014   Sulfa antibiotics Itching 08/15/2014   Tape Dermatitis 02/24/2018     ROS:  General: Negative for anorexia, weight loss, fever, chills, fatigue, weakness. ENT: Negative for hoarseness, difficulty swallowing , nasal congestion. CV: Negative for chest pain, angina, palpitations, dyspnea on exertion, peripheral edema.  Respiratory: Negative for dyspnea at rest, dyspnea on exertion, cough, sputum, wheezing.  GI: See history of present illness. GU:  Negative for dysuria, hematuria, urinary incontinence, urinary frequency, nocturnal urination.  Endo: Negative for unusual weight change.    Physical Examination:   BP 110/69   Pulse 75   Temp 97.9 F (36.6 C) (Oral)   Wt 191 lb 9.6 oz (86.9 kg)   BMI 32.89 kg/m   General: Well-nourished, well-developed in no acute distress.  Eyes: No icterus. Conjunctivae pink. Mouth: Oropharyngeal mucosa moist and pink , no lesions erythema or exudate. Lungs: Clear to auscultation bilaterally. Non-labored. Heart: Regular rate and rhythm, no murmurs rubs or gallops.  Abdomen: Bowel sounds are normal, nontender, mild distention generally, no hepatosplenomegaly or masses, no abdominal bruits or hernia , no rebound or guarding.   Extremities: No lower extremity edema. No clubbing or  deformities. Neuro: Alert and oriented x 3.  Grossly intact. Skin: Warm and dry, no jaundice.   Psych: Alert and cooperative, normal mood and affect. Bilateral pedal edema present but improved from last visit  Imaging Studies: No results found.  Assessment and Plan:   [name] is a 72 y.o. 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  This is her second round of TPN after the holiday for over a year.  Main concerns at this point of time are weight gain which is very likely due to the feed.  Based on her history she does not have orthopnea or PND or pedal edema making it less likely fluid overload and her albumin levels are also adequate.  She complains of bloating and abdominal discomfort which is possibly due to gas from consuming equal.  Advised her to increase her pantoprazole to 40 mg  a day and stop equal and use regular sugar and update me in a week's time.  If her symptoms persist then will need an office visit to examine her and discuss about endoscopic evaluation or imaging.  Her present symptoms have only been going on for a few weeks not longer.  In addition I will check her for selenium, niacin and zinc deficiency. Mild elevation in LFT's just above upper limit of normal ? TPN hepatopathy - occurred similarly during last use of TPN as well. Will continue to monitor.  I will obtain a CT scan of the abdomen to evaluate abdominal distention.  She may have an anastomotic ulcer we will add Carafate 4 times daily to her Protonix.  If her symptoms do not resolve in 2 weeks time then I will plan to perform an upper endoscopy.      Dr [name]  MD,MRCP [facility]) Follow up in 8 weeks video visit

## 2023-09-01 NOTE — Patient Instructions (Addendum)
[facility] at 8:45 AM for your CT Scan with contrast on 09/09/2023. Please do not eat or drink after midnight the night before your exam.

## 2023-09-01 NOTE — Addendum Note (Signed)
Addended by: Adela Ports on: 09/01/2023 03:50 PM   Modules accepted: Orders

## 2023-09-03 ENCOUNTER — Other Ambulatory Visit: Payer: Self-pay

## 2023-09-03 DIAGNOSIS — E43 Unspecified severe protein-calorie malnutrition: Secondary | ICD-10-CM

## 2023-09-03 DIAGNOSIS — Z789 Other specified health status: Secondary | ICD-10-CM

## 2023-09-09 ENCOUNTER — Ambulatory Visit
Admission: RE | Admit: 2023-09-09 | Discharge: 2023-09-09 | Disposition: A | Discharge status: Home / Self Care | Payer: Medicare Other | Source: Ambulatory Visit | Attending: Gastroenterology | Admitting: Gastroenterology

## 2023-09-09 DIAGNOSIS — Z789 Other specified health status: Secondary | ICD-10-CM | POA: Insufficient documentation

## 2023-09-09 DIAGNOSIS — R140 Abdominal distension (gaseous): Secondary | ICD-10-CM | POA: Insufficient documentation

## 2023-09-09 DIAGNOSIS — R1084 Generalized abdominal pain: Secondary | ICD-10-CM | POA: Diagnosis present

## 2023-09-09 MED ORDER — IOHEXOL 300 MG/ML  SOLN
100.0000 mL | Freq: Once | INTRAMUSCULAR | Status: AC | PRN
  Administered 2023-09-09: 100 mL via INTRAVENOUS

## 2023-09-18 NOTE — Progress Notes (Signed)
 [facility] [address], [place] [address]   CLINIC:  Medical Oncology/Hematology  PCP:  Jolee Greig DEL, PA-C [address] [name] [address] [phone]   REASON FOR VISIT:  Follow-up for iron deficiency anemia   PRIOR THERAPY: None   CURRENT THERAPY: Intermittent IV iron (last Feraheme  on 05/12/2022 and 05/25/2022)  INTERVAL HISTORY:   [name] 73 y.o. female returns for routine follow-up of her iron deficiency anemia.  She was last seen by Tiffany Barefoot PA-C on 07/06/2023.  She remains on daily TPN (restarted in September 2024), due to recurrent hypoalbuminemia and associated symptoms of peripheral edema and orthopnea.  (Previously hospitalized in March 2023 (critical hypoalbuminemia, severe vitamin/mineral deficiencies from malabsorption, with fluid overload) and placed on TPN through October 2023.) she continues to follow closely with her gastroenterologist, Dr. Ruel Kung.   At today's visit, she reports feeling fair.  She denies any signs of blood loss such as bright red blood per rectum, melena, epistaxis, or hematuria.  Her energy has improved somewhat after being restarted on TPN. She has episodes of dizziness related to dehiscence in her ear.   She denies any pica, restless legs, headaches, chest pain, or syncope.  Patient reports she stopped taking copper  supplement in October 2024, is unclear as to why.    She has 40% energy and 75% appetite. She endorses that she is maintaining a stable weight.   ASSESSMENT & PLAN:  1.  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 Ruel Kung) Currently on TPN, as of September 2024 - SPEP and immunofixation normal. - History of anastomotic ulcer many years ago - Episode of  hematochezia and fecal occult blood positive in September 2018, required PRBC transfusions - Most recent EGD/colonoscopy (12/12/2021): Normal esophagus.  Roux-en-Y gastrojejunostomy with gastrojejunal anastomosis characterized by congestion, edema, and erythema.  Normal colon. - She receives intermittent IV Feraheme , most recently on 05/25/2022.  She reports that her energy improves after IV iron. - She required hospitalization in March 2023 due to severe nutritional deficiencies, and was on TPN from March 2023 through October 2023.  TPN resumed in September 2024. - She stopped taking copper  in October 2024 (unknown as to why).  She no longer takes B12 or folate. - Labs from October via [facility] showed downtrending hemoglobin 9.6/MCV 104, normal WBC/platelets.  Copper  normal at 80, normal ferritin and iron. - Additional labs (07/06/2023): Normal B12, MMA, folate.  Reticulocytes elevated at 4.8%, with mildly low haptoglobin at 31 and normal LDH.   - Labs today (09/20/2023): Hgb 12.7/MCV 93.2.  Normal WBC, platelets, differential. Ferritin 25, iron saturation 27%. Normal reticulocytes 1.8%, normal LDH, DAT, bilirubin. PENDING labs = copper , haptoglobin - She denies any epistaxis, hematemesis, hematochezia, melena    - Her fatigue has significantly improved, but she tires easily  - PLAN: Hemoglobin has normalized now that she has been on TPN for several months.   - She has recurrent iron deficiency without anemia.  Recommend IV iron with Feraheme  x 2. - We will follow pending copper  results.  If low, will message patient to restart copper  supplement. -  Labs in 4 months (CBC/D, ferritin, iron/TIBC, B12, MMA, folate, copper ) + PHONE visit 1 week after labs  - She receives weekly labs at [facility] ordered by her  GI specialist.  She watches these results closely, and has been instructed to call our office if she has Hgb <9.0.   PLAN SUMMARY: >> IV Feraheme  x 2  >> Labs in 4 months = CBC/D, ferritin,  iron/TIBC, B12, MMA, folate, copper  >> PHONE visit 1 week after labs     REVIEW OF SYSTEMS:   Review of Systems  Constitutional:  Positive for fatigue. Negative for appetite change, chills, diaphoresis, fever and unexpected weight change.  HENT:   Negative for lump/mass and nosebleeds.   Eyes:  Negative for eye problems.  Respiratory:  Positive for shortness of breath. Negative for cough and hemoptysis.   Cardiovascular:  Negative for chest pain, leg swelling and palpitations.  Gastrointestinal:  Positive for abdominal pain. Negative for blood in stool, constipation, diarrhea, nausea and vomiting.  Genitourinary:  Negative for hematuria.   Skin: Negative.   Neurological:  Positive for dizziness, headaches and numbness. Negative for light-headedness.  Hematological:  Does not bruise/bleed easily.  Psychiatric/Behavioral:  Negative for sleep disturbance.      PHYSICAL EXAM:  ECOG PERFORMANCE STATUS: 1 - Symptomatic but completely ambulatory  Today's Vitals   09/20/23 1044  BP: 116/70  Pulse: 85  Resp: 18  Temp: (!) 97.4 F (36.3 C)  SpO2: 98%  PainSc: 4     Physical Exam Constitutional:      Appearance: Normal appearance. She is obese.  Cardiovascular:     Heart sounds: Normal heart sounds.  Pulmonary:     Breath sounds: Wheezing present.  Neurological:     General: No focal deficit present.     Mental Status: Mental status is at baseline.  Psychiatric:        Behavior: Behavior normal. Behavior is cooperative.     PAST MEDICAL/SURGICAL HISTORY:  Past Medical History:  Diagnosis Date   Asthma    CHF (congestive heart failure) (HCC)    GERD (gastroesophageal reflux disease)    Hypertension    Internal hemorrhoids    Iron deficiency anemia    Osteoporosis    Peptic ulcer    Tubular adenoma of colon    Vertigo    Past Surgical History:  Procedure Laterality Date   BACK SURGERY     BREAST SURGERY     CHOLECYSTECTOMY     COLONOSCOPY WITH PROPOFOL  N/A  06/02/2017   Procedure: COLONOSCOPY WITH PROPOFOL ;  Surgeon: Therisa Bi, MD;  Location: [facility] ENDOSCOPY;  Service: Gastroenterology;  Laterality: N/A;   COLONOSCOPY WITH PROPOFOL  N/A 01/28/2021   Procedure: COLONOSCOPY WITH PROPOFOL ;  Surgeon: Therisa Bi, MD;  Location: [facility] ENDOSCOPY;  Service: Gastroenterology;  Laterality: N/A;   COLONOSCOPY WITH PROPOFOL  N/A 12/12/2021   Procedure: COLONOSCOPY WITH PROPOFOL ;  Surgeon: Therisa Bi, MD;  Location: [facility] ENDOSCOPY;  Service: Gastroenterology;  Laterality: N/A;  Patient is requesting to be last since she lives in [place].   ESOPHAGOGASTRODUODENOSCOPY (EGD) WITH PROPOFOL  N/A 07/01/2017   Procedure: ESOPHAGOGASTRODUODENOSCOPY (EGD) WITH PROPOFOL ;  Surgeon: Therisa Bi, MD;  Location: [facility] ENDOSCOPY;  Service: Gastroenterology;  Laterality: N/A;   ESOPHAGOGASTRODUODENOSCOPY (EGD) WITH PROPOFOL  N/A 01/28/2021   Procedure: ESOPHAGOGASTRODUODENOSCOPY (EGD) WITH PROPOFOL ;  Surgeon: Therisa Bi, MD;  Location[facility] END[facility]COPY;  Service: Gastroenterology;  Laterality: N/A;   ESOPHAGOGASTRODUODENOSCOPY (EGD) WITH PROPOFOL  N/A 12/12/2021   Procedure: ESOPHAGOGASTRODUODENOSCOPY (EGD) WITH PROPOFOL ;  Surgeon: Therisa Bi, MD;  Location: [facility] ENDOSCOPY;  Service: Gastroenterology;  Laterality: N/A;   FLEXIBLE SIGMOIDOSCOPY N/A 07/01/2017   Procedure: FLEXIBLE SIGMOIDOSCOPY;  Surgeon: Therisa Bi, MD;  Location: [facility] ENDOSCOPY;  Service: Gastroenterology;  Laterality: N/A;   KNEE SURGERY     mini gastric bypass     SHOULDER SURGERY     TONSILLECTOMY      SOCIAL HISTORY:  Social History   Socioeconomic History   Marital status: Married    Spouse name: Not on file   Number of children: Not on file   Years of education: Not on file   Highest education level: Not on file  Occupational History   Not on file  Tobacco Use   Smoking status: Never   Smokeless tobacco: Never  Vaping Use   Vaping status: Never Used  Substance and Sexual Activity   Alcohol use: No     Alcohol/week: 0.0 standard drinks of alcohol   Drug use: Never   Sexual activity: Yes  Other Topics Concern   Not on file  Social History Narrative   Not on file   Social Drivers of Health   Financial Resource Strain: Low Risk  (08/09/2023)   Overall Financial Resource Strain (CARDIA)    Difficulty of Paying Living Expenses: Not hard at all  Food Insecurity: No Food Insecurity (08/09/2023)   Hunger Vital Sign    Worried About Running Out of Food in the Last Year: Never true    Ran Out of Food in the Last Year: Never true  Transportation Needs: No Transportation Needs (08/09/2023)   PRAPARE - Administrator, Civil Service (Medical): No    Lack of Transportation (Non-Medical): No  Physical Activity: Inactive (08/09/2023)   Exercise Vital Sign    Days of Exercise per Week: 0 days    Minutes of Exercise per Session: 0 min  Stress: No Stress Concern Present (08/09/2023)   [employer] of Occupational Health - Occupational Stress Questionnaire    Feeling of Stress : Only a little  Social Connections: Socially Integrated (08/09/2023)   Social Connection and Isolation Panel [NHANES]    Frequency of Communication with Friends and Family: More than three times a week    Frequency of Social Gatherings with Friends and Family: More than three times a week    Attends Religious Services: More than 4 times per year    Active Member of [employer] or Organizations: Yes    Attends Engineer, Structural: More than 4 times per year    Marital Status: Married  Catering Manager Violence: Not At Risk (08/09/2023)   Humiliation, Afraid, Rape, and Kick questionnaire    Fear of Current or Ex-Partner: No    Emotionally Abused: No    Physically Abused: No    Sexually Abused: No    FAMILY HISTORY:  Family History  Problem Relation Age of Onset   Breast cancer Paternal Grandmother    Diabetes Father    Heart disease Father    Colon cancer Father    Brain cancer Mother     Healthy Daughter    Hypertension Other    Stroke Other    Diabetes Other    Heart attack Other    Obesity Other    Stomach cancer Neg Hx    Pancreatic cancer Neg Hx     CURRENT MEDICATIONS:  Outpatient Encounter Medications as of 09/20/2023  Medication Sig   albuterol  (VENTOLIN  HFA) 108 (90 Base) MCG/ACT inhaler    Ascorbic Acid (VITAMIN C PO) Take 2,000 Units by mouth daily.   azelastine (ASTELIN) 0.1 % nasal spray    calcitRIOL  (ROCALTROL ) 0.5 MCG capsule Take 0.5 mcg by mouth daily.  Cholecalciferol  ([employer] D3) 125 MCG (5000 UT) capsule Take 1 tablet by mouth daily.   denosumab (PROLIA) 60 MG/ML SOSY injection Inject 60 mg into the skin every 6 (six) months.   fluticasone  (FLONASE) 50 MCG/ACT nasal spray Place into both nostrils.   levothyroxine (SYNTHROID) 25 MCG tablet Take 25 mcg by mouth every morning.   loratadine  (CLARITIN ) 10 MG tablet Take 1 tablet by mouth daily.   Melatonin 10 MG TABS Take 1 tablet by mouth as needed.   Multiple Vitamin (M.V.I. ADULT IV) Take 1 tablet by mouth daily.   pantoprazole  (PROTONIX ) 20 MG tablet TAKE 1 TABLET BY MOUTH EVERY DAY   sucralfate  (CARAFATE ) 1 GM/10ML suspension Take 10 mLs (1 g total) by mouth 4 (four) times daily.   Vitamin A  2400 MCG (8000 UT) CAPS Take 1 capsule by mouth daily.   Vitamin E 180 MG (400 UNIT) CAPS Take 1 capsule by mouth daily.   Facility-Administered Encounter Medications as of 09/20/2023  Medication   albumin  human 25 % solution 25 g   [COMPLETED] heparin  lock flush 100 unit/mL   [DISCONTINUED] sodium chloride  flush (NS) 0.9 % injection 10 mL    ALLERGIES:  Allergies  Allergen Reactions   Zoledronic  Acid Other (See Comments)    Other reaction(s): fever and flu-like symptoms   Azithromycin Diarrhea    Other reaction(s): diarrhea   Milk (Cow) Diarrhea   Prednisone Diarrhea    Other Reaction(s): GI Intolerance   Sulfa Antibiotics Itching   Tape Dermatitis    LABORATORY DATA:  I have reviewed the labs as  listed.  CBC    Component Value Date/Time   WBC 4.5 09/20/2023 1012   RBC 4.29 09/20/2023 1012   RBC 4.21 09/20/2023 1012   HGB 12.7 09/20/2023 1012   HGB 11.2 05/18/2023 0921   HCT 40.0 09/20/2023 1012   HCT 34.2 05/18/2023 0921   PLT 181 09/20/2023 1012   PLT 215 05/18/2023 0921   MCV 93.2 09/20/2023 1012   MCV 100 (H) 05/18/2023 0921   MCH 29.6 09/20/2023 1012   MCHC 31.8 09/20/2023 1012   RDW 12.0 09/20/2023 1012   RDW 12.6 05/18/2023 0921   LYMPHSABS 1.2 09/20/2023 1012   LYMPHSABS 1.4 05/18/2023 0921   MONOABS 0.3 09/20/2023 1012   EOSABS 0.1 09/20/2023 1012   EOSABS 0.0 05/18/2023 0921   BASOSABS 0.0 09/20/2023 1012   BASOSABS 0.0 05/18/2023 0921      Latest Ref Rng & Units 09/20/2023   10:12 AM 06/03/2023    1:06 PM 05/18/2023    9:21 AM  CMP  Glucose 70 - 99 mg/dL  880  57   BUN 8 - 23 mg/dL  15  20   Creatinine 9.55 - 1.00 mg/dL  9.38  9.36   Sodium 864 - 145 mmol/L  139  143   Potassium 3.5 - 5.1 mmol/L  3.7  3.8   Chloride 98 - 111 mmol/L  106  106   CO2 22 - 32 mmol/L  25  22   Calcium  8.9 - 10.3 mg/dL  7.6  7.4   Total Protein 6.0 - 8.5 g/dL   4.8   Total Bilirubin 0.0 - 1.2 mg/dL 0.3   0.7   Alkaline Phos 44 - 121 IU/L   86   AST 0 - 40 IU/L   42   ALT 0 - 32 IU/L   30     DIAGNOSTIC IMAGING:  I have independently reviewed the relevant imaging and discussed  with the patient.   WRAP UP:  All questions were answered. The patient knows to call the clinic with any problems, questions or concerns.  Medical decision making: Moderate  Time spent on visit: I spent 20 minutes counseling the patient face to face. The total time spent in the appointment was 30 minutes and more than 50% was on counseling.  Tiffany CHRISTELLA Barefoot, PA-C  09/20/23 1:43 PM

## 2023-09-20 ENCOUNTER — Inpatient Hospital Stay: Payer: Medicare Other

## 2023-09-20 ENCOUNTER — Other Ambulatory Visit: Payer: Medicare Other

## 2023-09-20 ENCOUNTER — Encounter: Payer: Self-pay | Admitting: Physician Assistant

## 2023-09-20 ENCOUNTER — Inpatient Hospital Stay: Payer: Medicare Other | Attending: Physician Assistant | Admitting: Physician Assistant

## 2023-09-20 VITALS — BP 116/70 | HR 85 | Temp 97.4°F | Resp 18

## 2023-09-20 DIAGNOSIS — E610 Copper deficiency: Secondary | ICD-10-CM | POA: Diagnosis not present

## 2023-09-20 DIAGNOSIS — Z95828 Presence of other vascular implants and grafts: Secondary | ICD-10-CM

## 2023-09-20 DIAGNOSIS — D500 Iron deficiency anemia secondary to blood loss (chronic): Secondary | ICD-10-CM

## 2023-09-20 DIAGNOSIS — E538 Deficiency of other specified B group vitamins: Secondary | ICD-10-CM | POA: Diagnosis not present

## 2023-09-20 DIAGNOSIS — Z9884 Bariatric surgery status: Secondary | ICD-10-CM | POA: Insufficient documentation

## 2023-09-20 DIAGNOSIS — K909 Intestinal malabsorption, unspecified: Secondary | ICD-10-CM | POA: Diagnosis not present

## 2023-09-20 DIAGNOSIS — D539 Nutritional anemia, unspecified: Secondary | ICD-10-CM

## 2023-09-20 DIAGNOSIS — D509 Iron deficiency anemia, unspecified: Secondary | ICD-10-CM | POA: Insufficient documentation

## 2023-09-20 DIAGNOSIS — D649 Anemia, unspecified: Secondary | ICD-10-CM

## 2023-09-20 LAB — DIRECT ANTIGLOBULIN TEST (NOT AT ARMC)
DAT, IgG: NEGATIVE
DAT, complement: NEGATIVE

## 2023-09-20 LAB — CBC WITH DIFFERENTIAL/PLATELET
Abs Immature Granulocytes: 0.01 10*3/uL (ref 0.00–0.07)
Basophils Absolute: 0 10*3/uL (ref 0.0–0.1)
Basophils Relative: 1 %
Eosinophils Absolute: 0.1 10*3/uL (ref 0.0–0.5)
Eosinophils Relative: 3 %
HCT: 40 % (ref 36.0–46.0)
Hemoglobin: 12.7 g/dL (ref 12.0–15.0)
Immature Granulocytes: 0 %
Lymphocytes Relative: 27 %
Lymphs Abs: 1.2 10*3/uL (ref 0.7–4.0)
MCH: 29.6 pg (ref 26.0–34.0)
MCHC: 31.8 g/dL (ref 30.0–36.0)
MCV: 93.2 fL (ref 80.0–100.0)
Monocytes Absolute: 0.3 10*3/uL (ref 0.1–1.0)
Monocytes Relative: 8 %
Neutro Abs: 2.8 10*3/uL (ref 1.7–7.7)
Neutrophils Relative %: 61 %
Platelets: 181 10*3/uL (ref 150–400)
RBC: 4.29 MIL/uL (ref 3.87–5.11)
RDW: 12 % (ref 11.5–15.5)
WBC: 4.5 10*3/uL (ref 4.0–10.5)
nRBC: 0 % (ref 0.0–0.2)

## 2023-09-20 LAB — FERRITIN: Ferritin: 25 ng/mL (ref 11–307)

## 2023-09-20 LAB — LACTATE DEHYDROGENASE: LDH: 172 U/L (ref 98–192)

## 2023-09-20 LAB — RETICULOCYTES
Immature Retic Fract: 10.5 % (ref 2.3–15.9)
RBC.: 4.21 MIL/uL (ref 3.87–5.11)
Retic Count, Absolute: 75.4 10*3/uL (ref 19.0–186.0)
Retic Ct Pct: 1.8 % (ref 0.4–3.1)

## 2023-09-20 LAB — IRON AND TIBC
Iron: 113 ug/dL (ref 28–170)
Saturation Ratios: 27 % (ref 10.4–31.8)
TIBC: 427 ug/dL (ref 250–450)
UIBC: 314 ug/dL

## 2023-09-20 LAB — BILIRUBIN, FRACTIONATED(TOT/DIR/INDIR)
Bilirubin, Direct: 0.1 mg/dL (ref 0.0–0.2)
Indirect Bilirubin: 0.2 mg/dL — ABNORMAL LOW (ref 0.3–0.9)
Total Bilirubin: 0.3 mg/dL (ref 0.0–1.2)

## 2023-09-20 LAB — SAMPLE TO BLOOD BANK

## 2023-09-20 MED ORDER — SODIUM CHLORIDE 0.9% FLUSH
10.0000 mL | INTRAVENOUS | Status: DC | PRN
Start: 2023-09-20 — End: 2023-09-20
  Administered 2023-09-20: 10 mL via INTRAVENOUS

## 2023-09-20 MED ORDER — HEPARIN SOD (PORK) LOCK FLUSH 100 UNIT/ML IV SOLN
500.0000 [IU] | Freq: Once | INTRAVENOUS | Status: AC
  Administered 2023-09-20: 500 [IU] via INTRAVENOUS

## 2023-09-20 NOTE — Progress Notes (Signed)
 Patients PICC flushed without difficulty.  Good blood return noted with no bruising or swelling noted at site. Per pt she has a home health nurse that comes to her house weekly to change her PICC dressing. Per pt her home health nurse comes tomorrow 09/21/23 to draw labs from PICC and change her PICC dressing. VSS with discharge and left in satisfactory condition with no s/s of distress noted. All follow ups as scheduled.   [name]

## 2023-09-20 NOTE — Patient Instructions (Signed)
 [facility] **VISIT SUMMARY & IMPORTANT INSTRUCTIONS **   You were seen today by Pleasant Barefoot PA-C for your anemia.   Your blood levels look much better now that you have been on TPN for several months. I do not yet have lab results for your iron levels or copper  levels.  I will send you a MyChart message to discuss these results once available. We will recheck labs and see you for follow-up visit in 4 months.  LABS: Continue weekly labs at [facility] as ordered by Dr. Therisa.  Please notify our office if you have hemoglobin <9.0  ** Thank you for trusting me with your healthcare!  I strive to provide all of my patients with quality care at each visit.  If you receive a survey for this visit, I would be so grateful to you for taking the time to provide feedback.  Thank you in advance!  ~ Ellinor Test                   Dr. Alean Stands   &   Pleasant Barefoot, PA-C   - - - - - - - - - - - - - - - - - -    Thank you for choosing [facility] to provide your oncology and hematology care.  To afford each patient quality time with our provider, please arrive at least 15 minutes before your scheduled appointment time.   If you have a lab appointment with the Cancer Center please come in thru the Main Entrance and check in at the main information desk.  You need to re-schedule your appointment should you arrive 10 or more minutes late.  We strive to give you quality time with our providers, and arriving late affects you and other patients whose appointments are after yours.  Also, if you no show three or more times for appointments you may be dismissed from the clinic at the providers discretion.     Again, thank you for choosing [facility].  Our hope is that these requests will decrease the amount of time that you wait before being seen by our physicians.        _____________________________________________________________  Should you have questions after your visit to [facility], please contact our office at [phone] and follow the prompts.  Our office hours are 8:00 a.m. and 4:30 p.m. Monday - Friday.  Please note that voicemails left after 4:00 p.m. may not be returned until the following business day.  We are closed weekends and major holidays.  You do have access to a nurse 24-7, just call the main number to the clinic [phone] and do not press any options, hold on the line and a nurse will answer the phone.    For prescription refill requests, have your pharmacy contact our office and allow 72 hours.

## 2023-09-21 ENCOUNTER — Encounter: Payer: Self-pay | Admitting: Neurology

## 2023-09-21 LAB — HAPTOGLOBIN: Haptoglobin: 76 mg/dL (ref 37–355)

## 2023-09-22 ENCOUNTER — Ambulatory Visit: Payer: Medicare Other

## 2023-09-22 ENCOUNTER — Other Ambulatory Visit: Payer: Self-pay | Admitting: Neurology

## 2023-09-22 ENCOUNTER — Telehealth: Payer: Self-pay | Admitting: Neurology

## 2023-09-22 ENCOUNTER — Encounter: Payer: Self-pay | Admitting: Physician Assistant

## 2023-09-22 DIAGNOSIS — N83202 Unspecified ovarian cyst, left side: Secondary | ICD-10-CM

## 2023-09-22 DIAGNOSIS — D329 Benign neoplasm of meninges, unspecified: Secondary | ICD-10-CM

## 2023-09-22 LAB — COPPER, SERUM: Copper: 127 ug/dL (ref 80–158)

## 2023-09-22 NOTE — Telephone Encounter (Signed)
 [employer] medicare NPR sent to GI [phone]

## 2023-09-22 NOTE — Telephone Encounter (Signed)
 MRI Brain ordered. Thanks

## 2023-09-22 NOTE — Progress Notes (Signed)
 PELVIC US  TA/TV: heterogeneous anteverted uterus with two calcified intramural fibroids,(#1) posterior mid uterus 1.5 x 1.4 cm,(#2) anterior mid uterus .7 x .5 cm,complex avascular nabothian cyst 1.2 x 1.2 x .8 cm,normal right ovary with multiple small calcification, avascular left adnexal anechoic cyst with a thin septation 6.3 x 4.5 x 3.6 cm,unable to visualize left adnexal cyst with T/V probe because of high location,no free fluid,no pain during ultrasound  Chaperone Peggy

## 2023-09-27 ENCOUNTER — Telehealth (INDEPENDENT_AMBULATORY_CARE_PROVIDER_SITE_OTHER): Payer: Medicare Other | Admitting: Gastroenterology

## 2023-09-27 NOTE — Progress Notes (Signed)
 Unable to connect issues with internet

## 2023-09-28 ENCOUNTER — Encounter: Payer: Self-pay | Admitting: Gastroenterology

## 2023-09-28 ENCOUNTER — Telehealth (INDEPENDENT_AMBULATORY_CARE_PROVIDER_SITE_OTHER): Payer: Medicare Other | Admitting: Gastroenterology

## 2023-09-28 DIAGNOSIS — Z9884 Bariatric surgery status: Secondary | ICD-10-CM

## 2023-09-28 DIAGNOSIS — Z789 Other specified health status: Secondary | ICD-10-CM

## 2023-09-28 DIAGNOSIS — K912 Postsurgical malabsorption, not elsewhere classified: Secondary | ICD-10-CM | POA: Diagnosis not present

## 2023-09-28 NOTE — Progress Notes (Signed)
 Ruel Kung , MD [address]  [address]  [place], [place] [address]  Main: [phone]  Fax: [phone]   Primary Care Physician: Jolee Greig DEL, PA-C  Virtual Visit via Video Note  I connected with patient on 09/28/23 at  1:00 PM EST by video and verified that I am speaking with the correct person using two identifiers.   I discussed the limitations, risks, security and privacy concerns of performing an evaluation and management service by video  and the availability of in person appointments. I also discussed with the patient that there may be a patient responsible charge related to this service. The patient expressed understanding and agreed to proceed.  Location of Patient: Home Location of Provider: Home Persons involved: Patient and provider only   History of Present Illness: Chief Complaint  Patient presents with   tpn    HPI: [name] is a 73 y.o. female      Summary of history :   Initially referred and seen on 01/08/2021 for vomiting after eating.  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 2019 she was seen by [facility] and underwent hemorrhoidal banding.  .Does complain of heartburn despite being on famotidine .  Previously was on pantoprazole .  Seen by the cancer center for iron deficiency anemia in March 2022 hemoglobin 13.1 g has received IV iron.   01/08/2021: Tested positive H. pylori.  Treated with quadruple based bismuth  regimen. 02/04/2021: Hemoglobin 13.5 g, ferritin 213, B12 normal, folate normal, CMP normal 01/28/2021: EGD: Features of gastric bypass seen otherwise procedure was normal.  Colonoscopy also performed on the same day.  2 sessile polyps 10 to 12 mm were resected colon there were tubular adenomas repeat colonoscopy in 3 years  03/19/2021: H. pylori breath test negative  10/03/2021: Ultrasound abdomen showed small amount of ascites  bilateral pleural effusions.  08/06/2021 echo shows normal study normal LV function  12/12/2021: EGD and colonoscopy performed with biopsies of the stomach and duodenum jejunum: Taken.  No evidence of duodenitis or celiac disease.  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  Terminal ileum appeared normal no evidence of microscopic colitis . No evidence of amyloidosis]  I referred her to [facility] for second opinion to help figure out the cause for the low albumin  and they also agreed with my assessment that her liver disease was not the cause of her hypoalbuminemia and effusions suggested endoscopy and colonoscopy to rule out any protein-losing enteropathy which we have performed as above.  The process of illumination the only obvious cause for the low albumin  was the gastric bypass Commenced on TPN on 12/23/2021.  Did very well gained her weight back pulmonary edema effusions resolved peripheral edema resolved.   06/21/2023:Hb 10.1 , ALT 45, AST 33, pre albumin  21, CRP <1 , MG 1.8 , albumin  3.2 .  08/09/2023: Hb 12.1 , albumin  3.2 , AST/ALT - normal.   08/17/2023: AST 58, ALT 86 08/23/2023: AST 80,ALT 125     Interval history 09/01/2023-1 09/27/2023   09/15/2023 CT abdomen pelvis with contrast shows 4.4 cm left ovarian cyst increase in size dating back to 2018 14 mm enhancing nodular component has increased in size.  Nonobstructive right renal stones up to 12 mm prior gastric bypass.  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   09/22/2023 prealbumin 22.2, hemoglobin  12.8 g phosphorus normal CMP AST of 49 ALT of 83 phosphorus of 4.7 copper  normal  Weight has remained stable- thinking of reducing to 5 days . Due to get iron infusions . No other complaints . Due to see her dietician.        Current Outpatient Medications  Medication Sig Dispense Refill   albuterol  (VENTOLIN  HFA)  108 (90 Base) MCG/ACT inhaler      Ascorbic Acid (VITAMIN C PO) Take 2,000 Units by mouth daily.     azelastine (ASTELIN) 0.1 % nasal spray      calcitRIOL  (ROCALTROL ) 0.5 MCG capsule Take 0.5 mcg by mouth daily.  5   Cholecalciferol  ([employer] D3) 125 MCG (5000 UT) capsule Take 1 tablet by mouth daily.     denosumab (PROLIA) 60 MG/ML SOSY injection Inject 60 mg into the skin every 6 (six) months.     fluticasone  (FLONASE) 50 MCG/ACT nasal spray Place into both nostrils.     levothyroxine (SYNTHROID) 25 MCG tablet Take 25 mcg by mouth every morning.     loratadine  (CLARITIN ) 10 MG tablet Take 1 tablet by mouth daily.     Melatonin 10 MG TABS Take 1 tablet by mouth as needed.     Multiple Vitamin (M.V.I. ADULT IV) Take 1 tablet by mouth daily.     pantoprazole  (PROTONIX ) 20 MG tablet TAKE 1 TABLET BY MOUTH EVERY DAY 90 tablet 3   sucralfate  (CARAFATE ) 1 GM/10ML suspension Take 10 mLs (1 g total) by mouth 4 (four) times daily. 1200 mL 11   Vitamin A  2400 MCG (8000 UT) CAPS Take 1 capsule by mouth daily.     Vitamin E 180 MG (400 UNIT) CAPS Take 1 capsule by mouth daily.     Current Facility-Administered Medications  Medication Dose Route Frequency Provider Last Rate Last Admin   albumin  human 25 % solution 25 g  25 g Intravenous Once Javen Ridings, MD        Allergies as of 09/28/2023 - Review Complete 09/27/2023  Allergen Reaction Noted   Zoledronic  acid Other (See Comments) 09/02/2021   Azithromycin Diarrhea 08/15/2014   Milk (cow) Diarrhea 08/15/2014   Prednisone Diarrhea 08/15/2014   Sulfa antibiotics Itching 08/15/2014   Tape Dermatitis 02/24/2018    Review of Systems:    All systems reviewed and negative except where noted in HPI.  General Appearance:    Alert, cooperative, no distress, appears stated age  Head:    Normocephalic, without obvious abnormality, atraumatic  Eyes:    PERRL, conjunctiva/corneas clear,  Ears:    Grossly normal hearing    Neurologic:  Grossly normal     Observations/Objective:  Labs: CMP     Component Value Date/Time   NA 139 06/03/2023 1306   NA 143 05/18/2023 0921   K 3.7 06/03/2023 1306   CL 106 06/03/2023 1306   CO2 25 06/03/2023 1306   GLUCOSE 119 (H) 06/03/2023 1306   BUN 15 06/03/2023 1306   BUN 20 05/18/2023 0921   CREATININE 0.61 06/03/2023 1306   CALCIUM  7.6 (L) 06/03/2023 1306   PROT 4.8 (L) 05/18/2023 0921   ALBUMIN  3.0 (L) 05/18/2023 0921   AST 42 (H) 05/18/2023 0921   ALT 30 05/18/2023 0921   ALKPHOS 86 05/18/2023 0921   BILITOT 0.3 09/20/2023 1012   BILITOT 0.7 05/18/2023 0921   GFRNONAA >60 06/03/2023 1306   GFRAA >60 05/02/2020 1308   Lab Results  Component Value Date   WBC 4.5 09/20/2023   HGB  12.7 09/20/2023   HCT 40.0 09/20/2023   MCV 93.2 09/20/2023   PLT 181 09/20/2023    Imaging Studies: US  PELVIC COMPLETE WITH TRANSVAGINAL Result Date: 09/25/2023 Images from the original result were not included.  ..an Financial Trader of Ultrasound Medicine TECHNICAL SALES ENGINEER) accredited practice Center for [facility] [address] [place] [address] Ordering Provider: Marilynn Nest, DO                                                                                                       GYNECOLOGIC SONOGRAM [name] is a 73 y.o. G2P1011 No LMP recorded. Patient is postmenopausal. She is here for a pelvic sonogram to evaluate left ovarian cyst seen on CT. Uterus                      2.8 x 1.6 x 3.4 cm, Total uterine volume 7.8 cc,atrophic heterogeneous anteverted uterus with two calcified intramural fibroids,(#1) posterior mid uterus 1.5 x 1.4 cm,(#2) anterior mid uterus .7 x .5 cm,complex avascular nabothian cyst 1.2 x 1.2 x .8 cm Endometrium          1 mm, symmetrical, WNL Right ovary             1.3 x 1.5 x .7 cm, normal right ovary with multiple small calcifications Left adnexa                avascular left adnexal anechoic cyst with a thin septation 6.3 x 4.5 x 3.6 cm,unable to  visualize left adnexal cyst with T/V probe because of high location Technician Comments: PELVIC US  TA/TV: atrophic heterogeneous anteverted uterus with two calcified intramural fibroids,(#1) posterior mid uterus 1.5 x 1.4 cm,(#2) anterior mid uterus .7 x .5 cm,complex avascular nabothian cyst 1.2 x 1.2 x .8 cm,normal right ovary with multiple small calcifications, avascular left adnexal anechoic cyst with a thin septation 6.3 x 4.5 x 3.6 cm,unable to visualize left adnexal cyst with T/V probe because of high location,no free fluid,no pain during ultrasound Chaperone [address] JINNY Pitts 09/22/2023 11:51 AM Clinical Impression and recommendations: I have reviewed the sonogram results above, combined with the patient's current clinical course, below are my impressions and any appropriate recommendations for management based on the sonographic findings. Uterus atrophic small consistent with menopause Endometrium hardly measurable consistent with menopause Ovaries: stable left adnexal cystic structure, no change essentailly in 6+ years, most liekly a paratubal cystic structure No free fluid or other findings that would be of concern No source for RLQ pain/pressure is identified Vonn VEAR Inch 09/25/2023 10:32 AM    CT ABDOMEN PELVIS W CONTRAST Result Date: 09/15/2023 CLINICAL DATA:  Abdominal distension and pain. EXAM: CT ABDOMEN AND PELVIS WITH CONTRAST TECHNIQUE: Multidetector CT imaging of the abdomen and pelvis was performed using the standard protocol following bolus administration of intravenous contrast. RADIATION DOSE REDUCTION: This exam was performed according to the departmental dose-optimization program which includes automated exposure control, adjustment of the mA and/or kV according to patient size and/or use of iterative reconstruction technique.  CONTRAST:  OMNIPAQUE  IOHEXOL  300 MG/ML  SOLN COMPARISON:  Multiple priors including CT July 20, 2022 FINDINGS: Lower chest: No acute abnormality. Partially  visualized bilateral breast prostheses. Hepatobiliary: No suspicious hepatic lesion. Gallbladder surgically absent. Similar prominence of the biliary tree compatible with reservoir effect post cholecystectomy. Pancreas: No pancreatic ductal dilation or evidence of acute inflammation. Spleen: No splenomegaly. Adrenals/Urinary Tract: Bilateral adrenal glands appear normal. No hydronephrosis. Nonobstructive right renal stones measure up to 12 mm. Bilateral cortical and renal sinus cysts are considered benign and requiring no independent imaging follow-up. Urinary bladder is minimally distended limiting evaluation. Stomach/Bowel: Radiopaque enteric contrast material traverses the rectum. Prior gastric bypass surgery. No pathologic dilation of small or large bowel. No evidence of acute bowel inflammation. Vascular/Lymphatic: Normal caliber abdominal aorta. Smooth IVC contours. The portal, splenic and superior mesenteric veins are patent. No pathologically enlarged abdominal or pelvic lymph nodes. Reproductive: Calcified leiomyomas in an atrophic uterus. Left ovarian cyst measures 4.4 cm on image 55/2 stable dating back to CT July 20, 2022 and minimally increased in size from CT June 28, 2017 when it measured 3.4 cm, with a 14 mm enhancing nodular component which previously measured 16 mm. Other: No significant abdominopelvic free fluid. Surgical change in the abdominal wall. Musculoskeletal: Prior vertebral body augmentation at T12 and L4. No acute osseous abnormality. IMPRESSION: 1. No acute abnormality in the abdomen or pelvis. 2. 4.4 cm left ovarian cyst slightly increased in size dating back to 2018. There is a with a 14 mm enhancing nodular component has minimally increased in size dating back to 2018 demonstrating a 14 mm enhancing nodular component which has slightly decreased from that examination and is favored to reflect preserved ovarian parenchyma along the margin of the cyst but warrants more  definitive assessment with pelvic ultrasound. 3. Nonobstructive right renal stones measure up to 12 mm. 4. Prior gastric bypass surgery without evidence of bowel obstruction. Electronically Signed   By: Reyes Holder M.D.   On: 09/15/2023 10:19    Assessment and Plan:   [name] is a 73 y.o. 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  This is her second round of TPN after the holiday for over a year.  Main concerns at this point of time are weight gain which is very likely due to the feed.  Based on her history she does not have orthopnea or PND or pedal edema making it less likely fluid overload and her albumin  levels are also adequate.  She complains of bloating and abdominal discomfort which is possibly due to gas from consuming equal which has reduced since she has cut down she probably has a degree of SIBO from the gastric bypass.  Recent abdominal imaging has shown no abnormalities.     Plan 1.  Send a copy of the CT scan of the abdomen and pelvic ultrasound to her GYN so that she can also discuss with her.  Appears the ovarian cyst has been stable for over 6 years 2.  Avoid artificial sugars and sweeteners 3.  She is taking food through her mouth in addition to TPN and she may probably do okay with just 5 days a week of TPN since her weight has been stable. 4.  Check selenium niacin and zinc  with neck set of labs we can also probably ease the lab checking from every 2 weeks to every 3  weeks since they have been reasonably stable 5.  Abnormal LFTs likely related to TPN feed have been stable if they arise we may need to intervene otherwise we can watch closely   Follow-up in 6 weeks video visit      I discussed the assessment and treatment plan with the patient. The patient was provided an opportunity to ask questions and all were answered. The patient agreed with the plan  and demonstrated an understanding of the instructions.   The patient was advised to call back or seek an in-person evaluation if the symptoms worsen or if the condition fails to improve as anticipated.  I provided 15 minutes of face-to-face time during this encounter.  Dr Ruel Kung MD,MRCP [facility]) Gastroenterology/Hepatology Pager: [phone]   Speech recognition software was used to dictate this note.

## 2023-09-28 NOTE — Addendum Note (Signed)
 Addended by: Adela Ports on: 09/28/2023 05:00 PM   Modules accepted: Orders

## 2023-09-29 NOTE — Addendum Note (Signed)
 Addended by: Hershell Lose on: 09/29/2023 10:11 AM   Modules accepted: Orders

## 2023-10-04 ENCOUNTER — Inpatient Hospital Stay: Payer: Medicare Other

## 2023-10-04 ENCOUNTER — Other Ambulatory Visit: Payer: Self-pay

## 2023-10-05 ENCOUNTER — Encounter: Payer: Self-pay | Admitting: Gastroenterology

## 2023-10-11 ENCOUNTER — Inpatient Hospital Stay: Payer: Medicare Other

## 2023-10-11 ENCOUNTER — Ambulatory Visit
Admission: RE | Admit: 2023-10-11 | Discharge: 2023-10-11 | Disposition: A | Discharge status: Home / Self Care | Payer: Medicare Other | Source: Ambulatory Visit | Attending: Neurology | Admitting: Neurology

## 2023-10-11 DIAGNOSIS — D329 Benign neoplasm of meninges, unspecified: Secondary | ICD-10-CM | POA: Diagnosis not present

## 2023-10-11 MED ORDER — GADOPICLENOL 0.5 MMOL/ML IV SOLN
10.0000 mL | Freq: Once | INTRAVENOUS | Status: AC | PRN
  Administered 2023-10-11: 9 mL via INTRAVENOUS

## 2023-10-12 ENCOUNTER — Telehealth: Payer: Self-pay

## 2023-10-12 NOTE — Telephone Encounter (Signed)
Call to patient after receiving my chart message. She had MRI completed yesterday. She is having increased blurry vision in her left and continued dizziness. She denies weakness or slurred speech. She stated the her MRI results are in mychart and I advised we haven't been sent results but I will let Dr. [name] Mariner and aware of continued symptoms.

## 2023-10-13 ENCOUNTER — Encounter: Payer: Self-pay | Admitting: Gastroenterology

## 2023-10-13 ENCOUNTER — Telehealth: Payer: Self-pay

## 2023-10-13 NOTE — Telephone Encounter (Signed)
Called Alyssa and had to leave her a voicemail letting her know that I needed to speak with her in reference to patient's TPN intake.

## 2023-10-18 ENCOUNTER — Encounter: Payer: Self-pay | Admitting: Neurology

## 2023-10-18 ENCOUNTER — Inpatient Hospital Stay: Payer: Medicare Other | Attending: Physician Assistant

## 2023-10-18 VITALS — BP 103/65 | HR 68 | Temp 97.6°F | Resp 18

## 2023-10-18 DIAGNOSIS — D509 Iron deficiency anemia, unspecified: Secondary | ICD-10-CM | POA: Diagnosis present

## 2023-10-18 DIAGNOSIS — D500 Iron deficiency anemia secondary to blood loss (chronic): Secondary | ICD-10-CM

## 2023-10-18 DIAGNOSIS — Z9884 Bariatric surgery status: Secondary | ICD-10-CM | POA: Diagnosis not present

## 2023-10-18 MED ORDER — SODIUM CHLORIDE 0.9 % IV SOLN: INTRAVENOUS | Status: DC

## 2023-10-18 MED ORDER — SODIUM CHLORIDE 0.9 % IV SOLN
510.0000 mg | Freq: Once | INTRAVENOUS | Status: AC
  Administered 2023-10-18: 510 mg via INTRAVENOUS
  Filled 2023-10-18: qty 510

## 2023-10-18 MED ORDER — SODIUM CHLORIDE 0.9% FLUSH
10.0000 mL | INTRAVENOUS | Status: DC | PRN
  Administered 2023-10-18: 10 mL via INTRAVENOUS

## 2023-10-18 MED ORDER — ACETAMINOPHEN 325 MG PO TABS
650.0000 mg | ORAL_TABLET | Freq: Once | ORAL | Status: AC
  Administered 2023-10-18: 650 mg via ORAL
  Filled 2023-10-18: qty 2

## 2023-10-18 MED ORDER — HEPARIN SOD (PORK) LOCK FLUSH 100 UNIT/ML IV SOLN
500.0000 [IU] | Freq: Once | INTRAVENOUS | Status: AC
  Administered 2023-10-18: 500 [IU] via INTRAVENOUS

## 2023-10-18 MED ORDER — CETIRIZINE HCL 10 MG PO TABS
10.0000 mg | ORAL_TABLET | Freq: Once | ORAL | Status: AC
  Administered 2023-10-18: 10 mg via ORAL
  Filled 2023-10-18: qty 1

## 2023-10-18 NOTE — Telephone Encounter (Signed)
Discussed MRI results with patient, all questions answered. She will follow up with ENT Dr. Manson Passey.

## 2023-10-18 NOTE — Patient Instructions (Signed)
 [facility] [place] - A DEPT OF [facility]  Discharge Instructions: Thank you for choosing [facility] to provide your oncology and hematology care.  If you have a lab appointment with the Cancer Center - please note that after April 8th, 2024, all labs will be drawn in the cancer center.  You do not have to check in or register with the main entrance as you have in the past but will complete your check-in in the cancer center.  Wear comfortable clothing and clothing appropriate for easy access to any Portacath or PICC line.   We strive to give you quality time with your provider. You may need to reschedule your appointment if you arrive late (15 or more minutes).  Arriving late affects you and other patients whose appointments are after yours.  Also, if you miss three or more appointments without notifying the office, you may be dismissed from the clinic at the provider's discretion.      For prescription refill requests, have your pharmacy contact our office and allow 72 hours for refills to be completed.    Today you received Feraheme IV iron infusion.     BELOW ARE SYMPTOMS THAT SHOULD BE REPORTED IMMEDIATELY: *FEVER GREATER THAN 100.4 F (38 C) OR HIGHER *CHILLS OR SWEATING *NAUSEA AND VOMITING THAT IS NOT CONTROLLED WITH YOUR NAUSEA MEDICATION *UNUSUAL SHORTNESS OF BREATH *UNUSUAL BRUISING OR BLEEDING *URINARY PROBLEMS (pain or burning when urinating, or frequent urination) *BOWEL PROBLEMS (unusual diarrhea, constipation, pain near the anus) TENDERNESS IN MOUTH AND THROAT WITH OR WITHOUT PRESENCE OF ULCERS (sore throat, sores in mouth, or a toothache) UNUSUAL RASH, SWELLING OR PAIN  UNUSUAL VAGINAL DISCHARGE OR ITCHING   Items with * indicate a potential emergency and should be followed up as soon as possible or go to the Emergency Department if any problems should occur.  Please show the CHEMOTHERAPY ALERT CARD or IMMUNOTHERAPY ALERT CARD at  check-in to the Emergency Department and triage nurse.  Should you have questions after your visit or need to cancel or reschedule your appointment, please contact [facility] [place] - A DEPT OF [facility] [phone]  and follow the prompts.  Office hours are 8:00 a.m. to 4:30 p.m. Monday - Friday. Please note that voicemails left after 4:00 p.m. may not be returned until the following business day.  We are closed weekends and major holidays. You have access to a nurse at all times for urgent questions. Please call the main number to the clinic [phone] and follow the prompts.  For any non-urgent questions, you may also contact your provider using MyChart. We now offer e-Visits for anyone 49 and older to request care online for non-urgent symptoms. For details visit [email or web address].   Also download the MyChart app! Go to the app store, search "MyChart", open the app, select , and log in with your MyChart username and password.

## 2023-10-18 NOTE — Progress Notes (Signed)
Patient presents today for iron infusion. Patient is in satisfactory condition with no new complaints voiced.  Vital signs are stable.  We will proceed with infusion per provider orders.    Feraheme 510 mg given today per MD orders. Tolerated infusion without adverse affects. Vital signs stable. No complaints at this time. Discharged from clinic ambulatory in stable condition. Alert and oriented x 3. F/U with [facility] as scheduled.

## 2023-10-18 NOTE — Telephone Encounter (Signed)
 noted

## 2023-10-20 ENCOUNTER — Telehealth: Payer: Self-pay | Admitting: Neurology

## 2023-10-20 NOTE — Telephone Encounter (Signed)
 request to cancel appointment

## 2023-10-25 ENCOUNTER — Inpatient Hospital Stay: Payer: Medicare Other

## 2023-11-01 ENCOUNTER — Ambulatory Visit: Payer: Medicare Other | Admitting: Neurology

## 2023-11-02 ENCOUNTER — Inpatient Hospital Stay: Payer: Medicare Other

## 2023-11-02 VITALS — BP 119/79 | HR 66 | Temp 96.4°F | Resp 18

## 2023-11-02 DIAGNOSIS — D500 Iron deficiency anemia secondary to blood loss (chronic): Secondary | ICD-10-CM

## 2023-11-02 DIAGNOSIS — D509 Iron deficiency anemia, unspecified: Secondary | ICD-10-CM | POA: Diagnosis not present

## 2023-11-02 MED ORDER — SODIUM CHLORIDE 0.9 % IV SOLN: INTRAVENOUS | Status: DC

## 2023-11-02 MED ORDER — SODIUM CHLORIDE 0.9 % IV SOLN
510.0000 mg | Freq: Once | INTRAVENOUS | Status: AC
  Administered 2023-11-02: 510 mg via INTRAVENOUS
  Filled 2023-11-02: qty 510

## 2023-11-02 MED ORDER — HEPARIN SOD (PORK) LOCK FLUSH 100 UNIT/ML IV SOLN
500.0000 [IU] | Freq: Once | INTRAVENOUS | Status: AC
  Administered 2023-11-02: 500 [IU] via INTRAVENOUS

## 2023-11-02 MED ORDER — ACETAMINOPHEN 325 MG PO TABS
650.0000 mg | ORAL_TABLET | Freq: Once | ORAL | Status: AC
Start: 2023-11-02 — End: 2023-11-02
  Administered 2023-11-02: 650 mg via ORAL
  Filled 2023-11-02: qty 2

## 2023-11-02 MED ORDER — CETIRIZINE HCL 10 MG PO TABS
10.0000 mg | ORAL_TABLET | Freq: Once | ORAL | Status: AC
Start: 2023-11-02 — End: 2023-11-02
  Administered 2023-11-02: 10 mg via ORAL
  Filled 2023-11-02: qty 1

## 2023-11-02 MED ORDER — SODIUM CHLORIDE 0.9% FLUSH
10.0000 mL | Freq: Once | INTRAVENOUS | Status: AC
  Administered 2023-11-02: 10 mL via INTRAVENOUS

## 2023-11-02 NOTE — Patient Instructions (Signed)

## 2023-11-02 NOTE — Progress Notes (Signed)
Patient tolerated iron infusion with no complaints voiced.  PICC IV site clean and dry with good blood return noted before and after infusion.  Dressing intact.   VSS with discharge and left in satisfactory condition with no s/s of distress noted.

## 2023-11-08 ENCOUNTER — Other Ambulatory Visit: Payer: Self-pay | Admitting: Neurology

## 2023-11-08 ENCOUNTER — Telehealth (INDEPENDENT_AMBULATORY_CARE_PROVIDER_SITE_OTHER): Payer: Medicare Other | Admitting: Gastroenterology

## 2023-11-08 DIAGNOSIS — Z789 Other specified health status: Secondary | ICD-10-CM | POA: Diagnosis not present

## 2023-11-08 DIAGNOSIS — Z79899 Other long term (current) drug therapy: Secondary | ICD-10-CM | POA: Diagnosis not present

## 2023-11-08 DIAGNOSIS — G932 Benign intracranial hypertension: Secondary | ICD-10-CM

## 2023-11-08 NOTE — Progress Notes (Signed)
 [name] , MD [address]  [address]  [place], [place] [address]  Main: [phone]  Fax: [phone]   Primary Care Physician: Encarnacion Slates, PA-C  Virtual Visit via Video Note  I connected with patient on 11/08/23 at  1:00 PM EST by video and verified that I am speaking with the correct person using two identifiers.   I discussed the limitations, risks, security and privacy concerns of performing an evaluation and management service by video  and the availability of in person appointments. I also discussed with the patient that there may be a patient responsible charge related to this service. The patient expressed understanding and agreed to proceed.  Location of Patient: Home Location of Provider: Home Persons involved: Patient and provider only   History of Present Illness: Chief Complaint  Patient presents with   TPN    HPI: [name] is a 73 y.o. female   Summary of history :   Initially referred and seen on 01/08/2021 for vomiting after eating.  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 2019 she was seen by [facility] and underwent hemorrhoidal banding.  .Does complain of heartburn despite being on famotidine.  Previously was on pantoprazole.  Seen by the cancer center for iron deficiency anemia in March 2022 hemoglobin 13.1 g has received IV iron.   01/08/2021: Tested positive H. pylori.  Treated with quadruple based bismuth regimen. 02/04/2021: Hemoglobin 13.5 g, ferritin 213, B12 normal, folate normal, CMP normal 01/28/2021: EGD: Features of gastric bypass seen otherwise procedure was normal.  Colonoscopy also performed on the same day.  2 sessile polyps 10 to 12 mm were resected colon there were tubular adenomas repeat colonoscopy in 3 years  03/19/2021: H. pylori breath test negative  10/03/2021: Ultrasound abdomen showed small amount of ascites bilateral  pleural effusions.  08/06/2021 echo shows normal study normal LV function  12/12/2021: EGD and colonoscopy performed with biopsies of the stomach and duodenum jejunum: Taken.  No evidence of duodenitis or celiac disease.  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  Terminal ileum appeared normal no evidence of microscopic colitis . No evidence of amyloidosis]  I referred her to [facility] for second opinion to help figure out the cause for the low albumin and they also agreed with my assessment that her liver disease was not the cause of her hypoalbuminemia and effusions suggested endoscopy and colonoscopy to rule out any protein-losing enteropathy which we have performed as above.  The process of illumination the only obvious cause for the low albumin was the gastric bypass Commenced on TPN on 12/23/2021.  Did very well gained her weight back pulmonary edema effusions resolved peripheral edema resolved.   06/21/2023:Hb 10.1 , ALT 45, AST 33, pre albumin 21, CRP <1 , MG 1.8 , albumin 3.2 .  08/09/2023: Hb 12.1 , albumin 3.2 , AST/ALT - normal.   08/17/2023: AST 58, ALT 86 08/23/2023: AST 80,ALT 125  09/15/2023 CT abdomen pelvis with contrast shows 4.4 cm left ovarian cyst increase in size dating back to 2018 14 mm enhancing nodular component has increased in size.  Nonobstructive right renal stones up to 12 mm prior gastric bypass.   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   09/22/2023 prealbumin 22.2, hemoglobin 12.8 g phosphorus normal CMP AST of 49 ALT of 83  phosphorus of 4.7 copper normal   Interval history 09/27/2023-11/08/2023   11/02/2023: Hb 12.7 , pre albumin 19.9 , cr 0.47, AST 43, ALT 95  Niacin was low - I spoke with her dietician to add deficient nutrients inti the feed  Weight up to 199.6 - dropped on the TPN rate of infusion and dropping weight . Plan is to drop to 3  times a week .    Finished iron infusion .  No complaints doing well except some issues with her eyes for which she is seeing a ophthalmologist.  Some concerns about increased intracranial pressure based on her description. Current Outpatient Medications  Medication Sig Dispense Refill   albuterol (VENTOLIN HFA) 108 (90 Base) MCG/ACT inhaler      Ascorbic Acid (VITAMIN C PO) Take 2,000 Units by mouth daily.     azelastine (ASTELIN) 0.1 % nasal spray      calcitRIOL (ROCALTROL) 0.5 MCG capsule Take 0.5 mcg by mouth daily.  5   Cholecalciferol ([employer] D3) 125 MCG (5000 UT) capsule Take 1 tablet by mouth daily.     denosumab (PROLIA) 60 MG/ML SOSY injection Inject 60 mg into the skin every 6 (six) months.     fluticasone (FLONASE) 50 MCG/ACT nasal spray Place into both nostrils.     levothyroxine (SYNTHROID) 25 MCG tablet Take 25 mcg by mouth every morning.     loratadine (CLARITIN) 10 MG tablet Take 1 tablet by mouth daily.     Melatonin 10 MG TABS Take 1 tablet by mouth as needed.     Multiple Vitamin (M.V.I. ADULT IV) Take 1 tablet by mouth daily.     pantoprazole (PROTONIX) 20 MG tablet TAKE 1 TABLET BY MOUTH EVERY DAY 90 tablet 3   sucralfate (CARAFATE) 1 GM/10ML suspension Take 10 mLs (1 g total) by mouth 4 (four) times daily. 1200 mL 11   Vitamin A 2400 MCG (8000 UT) CAPS Take 1 capsule by mouth daily.     Vitamin E 180 MG (400 UNIT) CAPS Take 1 capsule by mouth daily.     Current Facility-Administered Medications  Medication Dose Route Frequency Provider Last Rate Last Admin   albumin human 25 % solution 25 g  25 g Intravenous Once Tiffany Mood, MD        Allergies as of 11/08/2023 - Review Complete 11/08/2023  Allergen Reaction Noted   Zoledronic acid Other (See Comments) 09/02/2021   Azithromycin Diarrhea 08/15/2014   Milk (cow) Diarrhea 08/15/2014   Prednisone Diarrhea 08/15/2014   Sulfa antibiotics Itching 08/15/2014   Tape Dermatitis 02/24/2018    Review of Systems:    All  systems reviewed and negative except where noted in HPI.  General Appearance:    Alert, cooperative, no distress, appears stated age  Head:    Normocephalic, without obvious abnormality, atraumatic  Eyes:    PERRL, conjunctiva/corneas clear,  Ears:    Grossly normal hearing    Neurologic:  Grossly normal    Observations/Objective:  Labs: CMP     Component Value Date/Time   NA 139 06/03/2023 1306   NA 143 05/18/2023 0921   K 3.7 06/03/2023 1306   CL 106 06/03/2023 1306   CO2 25 06/03/2023 1306   GLUCOSE 119 (H) 06/03/2023 1306   BUN 15 06/03/2023 1306   BUN 20 05/18/2023 0921   CREATININE 0.61 06/03/2023 1306   CALCIUM 7.6 (L) 06/03/2023 1306   PROT 4.8 (L) 05/18/2023 0921   ALBUMIN 3.0 (L) 05/18/2023 0921   AST  42 (H) 05/18/2023 0921   ALT 30 05/18/2023 0921   ALKPHOS 86 05/18/2023 0921   BILITOT 0.3 09/20/2023 1012   BILITOT 0.7 05/18/2023 0921   GFRNONAA >60 06/03/2023 1306   GFRAA >60 05/02/2020 1308   Lab Results  Component Value Date   WBC 4.5 09/20/2023   HGB 12.7 09/20/2023   HCT 40.0 09/20/2023   MCV 93.2 09/20/2023   PLT 181 09/20/2023    Imaging Studies: MR BRAIN W WO CONTRAST Result Date: 10/11/2023  [facility] [address] [place], [place] [address] [phone] NEUROIMAGING REPORT STUDY DATE: 10/11/2023 PATIENT NAME: Oasis Ryals Burns DOB: 1950/10/15 MRN: [id number] EXAM: MRI Brain with and without contrast ORDERING CLINICIAN: Windell Norfolk, MD CLINICAL HISTORY: 73 year old woman with history of meningioma COMPARISON FILMS: 11/24/2022 TECHNIQUE:MRI of the brain with and without contrast was obtained utilizing 5 mm axial slices with T1, T2, T2 flair, SWI and diffusion weighted views.  T1 sagittal, T2 coronal and postcontrast views in the axial and coronal plane were obtained.  CONTRAST: 9 ml [employer] IMAGING SITE: [facility], [address] [place], [place], [place] FINDINGS: On sagittal images, the spinal cord is imaged caudally to C4  and is normal in caliber.   The contents of the posterior fossa are of normal size and position.   The pituitary gland has a reduced height with a normal-sized sella turcica.  The optic chiasm appear normal.    Brain volume appears normal.   The ventricles are normal in size and without distortion.  There are no abnormal extra-axial collections of fluid.  The cerebellum and brainstem appears normal.   The deep gray matter appears normal.  There are a few scattered scattered punctate T2/FLAIR hyperintense foci in the subcortical and deep white matter.  None of these appear to be acute.  They do not enhance.  No new foci compared to the 11/24/2022 MRI.  Diffusion weighted images are normal.  Susceptibility weighted images are normal.   There have been bilateral lens replacements.  Otherwise, the orbits appear normal.   The VIIth/VIIIth nerve complex appears normal.  The mastoid air cells appear normal.  The paranasal sinuses appear normal.  Flow voids are identified within the major intracerebral arteries.  There is a 6.5 x 4.4 mm extra-axial focus arising from the posterior falx cerebri and directed towards the left.  It has homogenous enhancement after contrast is administered.  It is unchanged in appearance and size compared to the MRI from 11/24/2022.   This MRI of the brain with and without contrast shows the following: Few scattered punctate T2/FLAIR hyperintense foci of the cerebral hemispheres consistent with minimal chronic microvascular ischemic change that is typical for age. 6.5 x 4.4 mm homogenously enhancing dural based mass arising from the posterior falx cerebral artery and directed towards the left consistent with a meningioma.  This is unchanged in size and characteristics compared to the MRI from 11/24/2022. The pituitary gland has a reduced height within a normal-sized sella turcica consistent with a partially empty sella.  This is usually an incidental finding but could also be seen with elevated  intracranial pressure. No acute findings.   INTERPRETING PHYSICIAN: Richard A. Epimenio Foot, MD, PhD, FAAN Certified in  Neuroimaging by [employer]    Assessment and Plan:   Loralei Ryals Burns is a 73 y.o. 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  This is her second round of TPN after the holiday for over a year.  At her last visit she had been gaining weight and it was felt that the weight gain was due to the excess calories from the TPN.  After reducing the quantity of feed her weight has dropped appropriately by a few pounds and she has discussed with her dietitian and they are planning to gradually decrease the quantity of feed and probably stop it in March since she weighs over 290 pounds at this point of time.  She probably has a degree of carbohydrate intolerance causing gas and bloating from artificial sugars and sweeteners which she has been told to avoid in the past   Plan 1.  Increase protein intake orally replace micronutrients.  She says that she is having some issues with her eyes and I had asked her to discuss with her ophthalmologist if any nutrient deficiencies could cause the same which could be checked if appropriate 3.  She is taking food through her mouth in addition to TPN. Plan is to go off TPM in march if possible as she has gained a lot of weight . She will also have her PICC pulled after.  4. Abnormal LFTs likely related to TPN feed have been stable if they arise we may need to intervene otherwise we can watch closely   F/u in April      I discussed the assessment and treatment plan with the patient. The patient was provided an opportunity to ask questions and all were answered. The patient agreed with the plan and demonstrated an understanding of the instructions.   The patient was advised to call back or seek an in-person evaluation if the symptoms  worsen or if the condition fails to improve as anticipated.  I provided 20 minutes of face-to-face time during this encounter.  Dr Tiffany Mood MD,MRCP [facility]) Gastroenterology/Hepatology Pager: [phone]   Speech recognition software was used to dictate this note.

## 2023-11-09 ENCOUNTER — Encounter: Payer: Self-pay | Admitting: Gastroenterology

## 2023-11-09 DIAGNOSIS — E618 Deficiency of other specified nutrient elements: Secondary | ICD-10-CM

## 2023-11-09 DIAGNOSIS — G932 Benign intracranial hypertension: Secondary | ICD-10-CM

## 2023-11-10 ENCOUNTER — Other Ambulatory Visit: Payer: Self-pay

## 2023-11-10 DIAGNOSIS — G932 Benign intracranial hypertension: Secondary | ICD-10-CM

## 2023-11-10 NOTE — Addendum Note (Signed)
 Addended by: Roena Malady on: 11/10/2023 11:24 AM   Modules accepted: Orders

## 2023-11-15 ENCOUNTER — Encounter: Payer: Self-pay | Admitting: Gastroenterology

## 2023-11-19 ENCOUNTER — Encounter: Payer: Self-pay | Admitting: Cardiology

## 2023-11-29 ENCOUNTER — Encounter: Payer: Self-pay | Admitting: Hematology

## 2023-12-01 ENCOUNTER — Encounter: Payer: Self-pay | Admitting: Gastroenterology

## 2023-12-13 ENCOUNTER — Telehealth: Payer: Self-pay | Admitting: Gastroenterology

## 2023-12-13 NOTE — Telephone Encounter (Signed)
 The patient called to reschedule due to a conflict in her schedule.

## 2023-12-20 ENCOUNTER — Telehealth (INDEPENDENT_AMBULATORY_CARE_PROVIDER_SITE_OTHER): Payer: Medicare Other | Admitting: Gastroenterology

## 2023-12-20 DIAGNOSIS — Z91199 Patient's noncompliance with other medical treatment and regimen due to unspecified reason: Secondary | ICD-10-CM

## 2023-12-21 ENCOUNTER — Other Ambulatory Visit: Payer: Self-pay | Admitting: Neurology

## 2023-12-21 ENCOUNTER — Telehealth (INDEPENDENT_AMBULATORY_CARE_PROVIDER_SITE_OTHER): Admitting: Gastroenterology

## 2023-12-21 DIAGNOSIS — E43 Unspecified severe protein-calorie malnutrition: Secondary | ICD-10-CM

## 2023-12-21 DIAGNOSIS — H538 Other visual disturbances: Secondary | ICD-10-CM

## 2023-12-21 NOTE — Addendum Note (Signed)
 Addended by: Adela Ports on: 12/21/2023 02:23 PM   Modules accepted: Orders

## 2023-12-21 NOTE — Progress Notes (Signed)
 [name] , MD [address]  [address]  [place], [place] [address]  Main: [phone]  Fax: [phone]   Primary Care Physician: Encarnacion Slates, PA-C  Virtual Visit via Video Note  I connected with patient on 12/21/23 at  2:15 PM EDT by video and verified that I am speaking with the correct person using two identifiers.   I discussed the limitations, risks, security and privacy concerns of performing an evaluation and management service by video  and the availability of in person appointments. I also discussed with the patient that there may be a patient responsible charge related to this service. The patient expressed understanding and agreed to proceed.  Location of Patient: Home Location of Provider: Home Persons involved: Patient and provider only   History of Present Illness: Chief Complaint  Patient presents with   TPN    HPI: [name] is a 73 y.o. female  Summary of history :   Initially referred and seen on 01/08/2021 for vomiting after eating.  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 2019 she was seen by [facility] and underwent hemorrhoidal banding.  .Does complain of heartburn despite being on famotidine.  Previously was on pantoprazole.  Seen by the cancer center for iron deficiency anemia in March 2022 hemoglobin 13.1 g has received IV iron.   01/08/2021: Tested positive H. pylori.  Treated with quadruple based bismuth regimen. 02/04/2021: Hemoglobin 13.5 g, ferritin 213, B12 normal, folate normal, CMP normal 01/28/2021: EGD: Features of gastric bypass seen otherwise procedure was normal.  Colonoscopy also performed on the same day.  2 sessile polyps 10 to 12 mm were resected colon there were tubular adenomas repeat colonoscopy in 3 years  03/19/2021: H. pylori breath test negative  10/03/2021: Ultrasound abdomen showed small amount of ascites bilateral  pleural effusions.  08/06/2021 echo shows normal study normal LV function  12/12/2021: EGD and colonoscopy performed with biopsies of the stomach and duodenum jejunum: Taken.  No evidence of duodenitis or celiac disease.  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  Terminal ileum appeared normal no evidence of microscopic colitis . No evidence of amyloidosis]  I referred her to [facility] for second opinion to help figure out the cause for the low albumin and they also agreed with my assessment that her liver disease was not the cause of her hypoalbuminemia and effusions suggested endoscopy and colonoscopy to rule out any protein-losing enteropathy which we have performed as above.  The process of illumination the only obvious cause for the low albumin was the gastric bypass Commenced on TPN on 12/23/2021.  Did very well gained her weight back pulmonary edema effusions resolved peripheral edema resolved.   06/21/2023:Hb 10.1 , ALT 45, AST 33, pre albumin 21, CRP <1 , MG 1.8 , albumin 3.2 .  08/09/2023: Hb 12.1 , albumin 3.2 , AST/ALT - normal.   08/17/2023: AST 58, ALT 86 08/23/2023: AST 80,ALT 125  09/15/2023 CT abdomen pelvis with contrast shows 4.4 cm left ovarian cyst increase in size dating back to 2018 14 mm enhancing nodular component has increased in size.  Nonobstructive right renal stones up to 12 mm prior gastric bypass.   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     azelastine (ASTELIN) 0.1 % nasal spray      calcitRIOL (ROCALTROL) 0.5 MCG capsule Take 0.5 mcg by mouth daily.  5   Cholecalciferol ([employer] D3) 125 MCG (5000 UT) capsule Take 1 tablet by mouth daily.     denosumab (PROLIA) 60 MG/ML SOSY injection Inject 60 mg into the skin every 6 (six) months.     fluticasone (FLONASE) 50 MCG/ACT nasal spray Place into both nostrils.     levothyroxine (SYNTHROID) 25 MCG tablet Take 25 mcg by mouth every morning.     loratadine (CLARITIN) 10 MG tablet Take 1 tablet by mouth daily.     Melatonin 10 MG TABS Take 1 tablet by mouth as needed.     Multiple Vitamin (M.V.I. ADULT IV) Take 1 tablet by mouth daily.     pantoprazole (PROTONIX) 20 MG tablet TAKE 1 TABLET BY MOUTH EVERY DAY 90 tablet 3   sucralfate (CARAFATE) 1 GM/10ML suspension Take 10 mLs (1 g total) by mouth 4 (four) times daily. 1200 mL 11   Vitamin A 2400 MCG (8000 UT) CAPS Take 1 capsule by mouth daily.     Vitamin E 180 MG (400 UNIT) CAPS Take 1 capsule by mouth daily.     Current Facility-Administered Medications  Medication Dose Route Frequency Provider Last Rate Last Admin   albumin human 25 % solution 25 g  25 g Intravenous Once Tiffany Mood, MD        Allergies as of 12/21/2023 - Review Complete 11/08/2023  Allergen Reaction Noted   Zoledronic acid Other (See Comments) 09/02/2021   Azithromycin Diarrhea 08/15/2014   Milk (cow) Diarrhea 08/15/2014   Prednisone Diarrhea 08/15/2014   Sulfa antibiotics Itching 08/15/2014   Tape Dermatitis 02/24/2018    Review of Systems:    All systems reviewed and negative except where noted in HPI.  General Appearance:    Alert, cooperative, no distress, appears stated age   Head:    Normocephalic, without obvious abnormality, atraumatic  Eyes:    PERRL, conjunctiva/corneas clear,  Ears:    Grossly normal hearing    Neurologic:  Grossly normal    Observations/Objective:  Labs: CMP     Component Value Date/Time   NA 139 06/03/2023 1306   NA 143 05/18/2023 0921   K 3.7 06/03/2023 1306   CL 106 06/03/2023 1306   CO2 25 06/03/2023 1306   GLUCOSE 119 (H) 06/03/2023 1306   BUN 15 06/03/2023 1306   BUN 20 05/18/2023 0921   CREATININE 0.61 06/03/2023 1306   CALCIUM 7.6 (L) 06/03/2023 1306   PROT 4.8 (L) 05/18/2023 0921   ALBUMIN 3.0 (L) 05/18/2023 0921   AST 42 (H) 05/18/2023 0921   ALT 30 05/18/2023 0921   ALKPHOS 86 05/18/2023 0921   BILITOT 0.3 09/20/2023 1012   BILITOT 0.7  05/18/2023 0921   GFRNONAA >60 06/03/2023 1306   GFRAA >60 05/02/2020 1308   Lab Results  Component Value Date   WBC 4.5 09/20/2023   HGB 12.7 09/20/2023   HCT 40.0 09/20/2023   MCV 93.2 09/20/2023   PLT 181 09/20/2023    Imaging Studies: No results found.  Assessment and Plan:   Taima Ryals Burns is a 73 y.o. 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  This is her second round of TPN after the holiday for over a year.  At her last visit she had been gaining weight and it was felt that the weight gain was due to the excess calories from the TPN.  She is doing well since the PICC line has been taken off and eating well orally.  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  No GI issues at this point of time she will obtain a CBC and CMP in mid July and see me in August.  I explained to her that I will be moving to [facility] and she is welcome to come and see me when I transition or stay back at this practice.       I discussed the assessment and treatment plan with the patient. The patient was provided an  opportunity to ask questions and all were answered. The patient agreed with the plan and demonstrated an understanding of the instructions.   The patient was advised to call back or seek an in-person evaluation if the symptoms worsen or if the condition fails to improve as anticipated.  I provided 15 minutes of face-to-face time during this encounter.  Dr Tiffany Mood MD,MRCP [facility]) Gastroenterology/Hepatology Pager: [phone]   Speech recognition software was used to dictate this note.

## 2023-12-23 NOTE — Progress Notes (Signed)
error 

## 2024-01-11 ENCOUNTER — Inpatient Hospital Stay: Attending: Physician Assistant

## 2024-01-11 ENCOUNTER — Other Ambulatory Visit: Payer: Self-pay

## 2024-01-11 ENCOUNTER — Inpatient Hospital Stay: Payer: Medicare Other

## 2024-01-11 DIAGNOSIS — D500 Iron deficiency anemia secondary to blood loss (chronic): Secondary | ICD-10-CM

## 2024-01-11 DIAGNOSIS — E538 Deficiency of other specified B group vitamins: Secondary | ICD-10-CM | POA: Diagnosis not present

## 2024-01-11 DIAGNOSIS — D539 Nutritional anemia, unspecified: Secondary | ICD-10-CM

## 2024-01-11 DIAGNOSIS — D649 Anemia, unspecified: Secondary | ICD-10-CM

## 2024-01-11 DIAGNOSIS — E610 Copper deficiency: Secondary | ICD-10-CM | POA: Diagnosis not present

## 2024-01-11 DIAGNOSIS — K909 Intestinal malabsorption, unspecified: Secondary | ICD-10-CM

## 2024-01-11 DIAGNOSIS — D509 Iron deficiency anemia, unspecified: Secondary | ICD-10-CM | POA: Insufficient documentation

## 2024-01-11 LAB — CBC WITH DIFFERENTIAL/PLATELET
Abs Immature Granulocytes: 0.01 10*3/uL (ref 0.00–0.07)
Basophils Absolute: 0 10*3/uL (ref 0.0–0.1)
Basophils Relative: 1 %
Eosinophils Absolute: 0.1 10*3/uL (ref 0.0–0.5)
Eosinophils Relative: 2 %
HCT: 40.3 % (ref 36.0–46.0)
Hemoglobin: 13.1 g/dL (ref 12.0–15.0)
Immature Granulocytes: 0 %
Lymphocytes Relative: 22 %
Lymphs Abs: 1.1 10*3/uL (ref 0.7–4.0)
MCH: 31.2 pg (ref 26.0–34.0)
MCHC: 32.5 g/dL (ref 30.0–36.0)
MCV: 96 fL (ref 80.0–100.0)
Monocytes Absolute: 0.6 10*3/uL (ref 0.1–1.0)
Monocytes Relative: 12 %
Neutro Abs: 3.2 10*3/uL (ref 1.7–7.7)
Neutrophils Relative %: 63 %
Platelets: 161 10*3/uL (ref 150–400)
RBC: 4.2 MIL/uL (ref 3.87–5.11)
RDW: 12.8 % (ref 11.5–15.5)
WBC: 5 10*3/uL (ref 4.0–10.5)
nRBC: 0 % (ref 0.0–0.2)

## 2024-01-11 LAB — COMPREHENSIVE METABOLIC PANEL WITH GFR
ALT: 43 U/L (ref 0–44)
AST: 47 U/L — ABNORMAL HIGH (ref 15–41)
Albumin: 3.4 g/dL — ABNORMAL LOW (ref 3.5–5.0)
Alkaline Phosphatase: 49 U/L (ref 38–126)
Anion gap: 9 (ref 5–15)
BUN: 10 mg/dL (ref 8–23)
CO2: 24 mmol/L (ref 22–32)
Calcium: 8.2 mg/dL — ABNORMAL LOW (ref 8.9–10.3)
Chloride: 107 mmol/L (ref 98–111)
Creatinine, Ser: 0.51 mg/dL (ref 0.44–1.00)
GFR, Estimated: >60 mL/min (ref >60)
Glucose, Bld: 89 mg/dL (ref 70–99)
Potassium: 2.9 mmol/L — ABNORMAL LOW (ref 3.5–5.1)
Sodium: 140 mmol/L (ref 135–145)
Total Bilirubin: 0.8 mg/dL (ref 0.0–1.2)
Total Protein: 5.9 g/dL — ABNORMAL LOW (ref 6.5–8.1)

## 2024-01-11 LAB — FOLATE: Folate: 25.2 ng/mL (ref >5.9)

## 2024-01-11 LAB — IRON AND TIBC
Iron: 120 ug/dL (ref 28–170)
Saturation Ratios: 41 % — ABNORMAL HIGH (ref 10.4–31.8)
TIBC: 296 ug/dL (ref 250–450)
UIBC: 176 ug/dL

## 2024-01-11 LAB — FERRITIN: Ferritin: 166 ng/mL (ref 11–307)

## 2024-01-11 LAB — VITAMIN B12: Vitamin B-12: 541 pg/mL (ref 180–914)

## 2024-01-13 LAB — METHYLMALONIC ACID, SERUM: Methylmalonic Acid, Quantitative: 144 nmol/L (ref 0–378)

## 2024-01-13 LAB — COPPER, SERUM: Copper: 104 ug/dL (ref 80–158)

## 2024-01-17 NOTE — Progress Notes (Unsigned)
 VIRTUAL VISIT via TELEPHONE NOTE [facility]   I connected with [name] [name]  on 01/18/24 at  8:30 AM by telephone and verified that I am speaking with the correct person using two identifiers.  Location: Patient: Home Provider: [facility]   I discussed the limitations, risks, security and privacy concerns of performing an evaluation and management service by telephone and the availability of in person appointments. I also discussed with the patient that there may be a patient responsible charge related to this service. The patient expressed understanding and agreed to proceed.  REASON FOR VISIT:  Follow-up for iron deficiency anemia   PRIOR THERAPY: None   CURRENT THERAPY: Intermittent IV iron (last Feraheme  on 10/18/2023 and 11/02/2023)  INTERVAL HISTORY:   [name] 73 y.o. female returns for routine follow-up of her iron deficiency anemia and other nutritional deficiencies.  She was last seen by Sheril Dines PA-C on 09/20/2023.  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  Most recently, she has been off of TPN since March 2025.  She had surgery on her right ear on Thursday last week, and has had some decreased energy and vertigo following that procedure.  Otherwise though, she reports that she has had good energy.  She denies any pica, restless legs, headache, chest pain, dyspnea, or syncope.  She reports mild intermittent rectal bleeding that has occurred 3-4 times in the past year.  No melena or hematemesis.  She has 50% energy and 100% appetite. She endorses that she is maintaining a stable weight.    ASSESSMENT & PLAN:  1.  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 Luke Salaam) Currently on TPN, as of September 2024 - SPEP and immunofixation normal. - History of anastomotic ulcer many years ago - Episode of hematochezia and fecal occult blood positive in September 2018, required PRBC transfusions - Most recent EGD/colonoscopy (12/12/2021): Normal esophagus.  Roux-en-Y gastrojejunostomy with gastrojejunal anastomosis characterized by congestion, edema, and erythema.  Normal colon. - She receives intermittent IV Feraheme , most recently on 05/25/2022.  She reports that her energy improves after IV iron. - She required intermittent TPN due to severe nutritional deficiencies.  First course of TPN from March 2023 to October 2023.  Second course of TPN from September 2024 through March 2025. - Previously required multiple supplements due to several vitamin deficiencies, but this improved when she was placed on TPN.  She no longer takes B12, copper , or folate. - Most recent labs (01/11/2024): CBC is normal with Hgb 13.1, MCV 96.0.  Normal platelets/WBC. Creatinine 0.51/GFR >60 Ferritin 166, iron saturation 41% Normal copper , folate, B12, MMA. - Occasional rectal bleeding.  No epistaxis, hematemesis, or melena.     - PLAN: Normal CBC and nutritional panel.  Education to restart supplements at this time, but we will recheck labs and see her again for office visit in 6 months, since she is off of TPN and is at risk for recurrent vitamin/mineral deficiencies. -  Labs in 6 months (CBC/D, ferritin, iron/TIBC, B12, MMA, folate, copper ) + OFFICE visit 1 week after labs  - She receives weekly labs at [facility] ordered by her GI specialist.  She watches these results closely, and has been instructed to call our  office if she has Hgb <10.0.   PLAN SUMMARY: **MON or TUES appointments only** >> Labs in 6 months = CBC/D, ferritin, iron/TIBC, B12, MMA, folate, copper , CMP >> OFFICE visit 1 week after labs    REVIEW OF SYSTEMS:    Review of Systems  Constitutional:  Negative for chills, diaphoresis, fever, malaise/fatigue and weight loss.  Respiratory:  Negative for cough and shortness of breath.   Cardiovascular:  Negative for chest pain and palpitations.  Gastrointestinal:  Positive for vomiting. Negative for abdominal pain, blood in stool, melena and nausea.  Neurological:  Positive for dizziness, tingling and headaches.     PHYSICAL EXAM: (per limitations of virtual telephone visit)  The patient is alert and oriented x 3, exhibiting adequate mentation, good mood, and ability to speak in full sentences and execute sound judgement.  WRAP UP:   I discussed the assessment and treatment plan with the patient. The patient was provided an opportunity to ask questions and all were answered. The patient agreed with the plan and demonstrated an understanding of the instructions.   The patient was advised to call back or seek an in-person evaluation if the symptoms worsen or if the condition fails to improve as anticipated.  I provided 22 minutes of non-face-to-face time during this encounter, including >10 minutes of medical discussion.  Sonnie Dusky, PA-C 01/18/2024 8:55 AM

## 2024-01-18 ENCOUNTER — Ambulatory Visit: Attending: Cardiology | Admitting: Cardiology

## 2024-01-18 ENCOUNTER — Encounter: Payer: Self-pay | Admitting: Cardiology

## 2024-01-18 ENCOUNTER — Inpatient Hospital Stay: Payer: Medicare Other | Attending: Physician Assistant | Admitting: Physician Assistant

## 2024-01-18 VITALS — BP 130/82 | HR 84 | Ht 64.0 in | Wt 197.8 lb

## 2024-01-18 DIAGNOSIS — E610 Copper deficiency: Secondary | ICD-10-CM

## 2024-01-18 DIAGNOSIS — K909 Intestinal malabsorption, unspecified: Secondary | ICD-10-CM | POA: Diagnosis not present

## 2024-01-18 DIAGNOSIS — I471 Supraventricular tachycardia, unspecified: Secondary | ICD-10-CM

## 2024-01-18 DIAGNOSIS — D500 Iron deficiency anemia secondary to blood loss (chronic): Secondary | ICD-10-CM | POA: Diagnosis not present

## 2024-01-18 DIAGNOSIS — R600 Localized edema: Secondary | ICD-10-CM

## 2024-01-18 DIAGNOSIS — E538 Deficiency of other specified B group vitamins: Secondary | ICD-10-CM

## 2024-01-18 DIAGNOSIS — D539 Nutritional anemia, unspecified: Secondary | ICD-10-CM | POA: Diagnosis not present

## 2024-01-18 DIAGNOSIS — R002 Palpitations: Secondary | ICD-10-CM | POA: Diagnosis not present

## 2024-01-18 DIAGNOSIS — D649 Anemia, unspecified: Secondary | ICD-10-CM

## 2024-01-18 NOTE — Patient Instructions (Addendum)
 Medication Instructions:  Continue all current medications.   Labwork: none  Testing/Procedures: none  Follow-Up: 6 months   Any Other Special Instructions Will Be Listed Below (If Applicable).   If you need a refill on your cardiac medications before your next appointment, please call your pharmacy.

## 2024-01-18 NOTE — Progress Notes (Signed)
 Clinical Summary Ms. Swaziland is a 73 y.o.female seen today for follow up of the following medical problems.    1.Palpitations - prior monitors have shown PACs and atrial runs -08/2021 monitor occasional PACs, frequent runs of SVT up to 29 seconds. Two runs of NSVT longest 8 beats. Symptoms correalted with PACs and SVT.    - 2 episodes of palpitations within the last 2 weeks. One episode awoke her from her sleep, HR was high 140s, resolved within a few minutes. Milder episode after recent inner ear surgery, HRs 120s.     2. Chest pain - 07/2021 nuclear stress: no ischemia - no recent chest pains.    3. SOB/Edema - ongoign eval by pumlonary - benign stress test - 07/2021 [facility] echo low normal to mildly decreased LVEF 45-50%, grade I diastolic dysfunction. She is on oral lasix . Volme status difficult to assess due to body habitus.  - could be related to her PACs and PSVT   -orthostatic symptoms and fall on higher diuretic dosing, see 11/26/21 phone call  11/2021 Pro BNP 1025 --> 383 - - issues with hypothyroidism - albumin  2.2, from notes thought that liver disease is not cause. From GI thoughts perhaps malabsoprtion due to gastric bypass     - just completed another course of TPN with PICC line managed by GI       Has [place] house near [place], [place] they spend most of the summer at.    Past Medical History:  Diagnosis Date   Asthma    CHF (congestive heart failure) (HCC)    GERD (gastroesophageal reflux disease)    Hypertension    Internal hemorrhoids    Iron deficiency anemia    Osteoporosis    Peptic ulcer    Tubular adenoma of colon    Vertigo      Allergies  Allergen Reactions   Zoledronic  Acid Other (See Comments)    Other reaction(s): fever and flu-like symptoms   Azithromycin Diarrhea    Other reaction(s): diarrhea   Milk (Cow) Diarrhea   Prednisone Diarrhea    Other Reaction(s): GI Intolerance   Sulfa Antibiotics Itching   Tape Dermatitis      Current Outpatient Medications  Medication Sig Dispense Refill   albuterol  (VENTOLIN  HFA) 108 (90 Base) MCG/ACT inhaler      amoxicillin-clavulanate (AUGMENTIN) 875-125 MG tablet Take 1 tablet by mouth 2 (two) times daily.     Ascorbic Acid (VITAMIN C PO) Take 2,000 Units by mouth daily.     azelastine (ASTELIN) 0.1 % nasal spray      calcitRIOL  (ROCALTROL ) 0.5 MCG capsule Take 0.5 mcg by mouth daily.  5   Cholecalciferol  (CVS D3) 125 MCG (5000 UT) capsule Take 1 tablet by mouth daily.     denosumab (PROLIA) 60 MG/ML SOSY injection Inject 60 mg into the skin every 6 (six) months.     fluticasone  (FLONASE) 50 MCG/ACT nasal spray Place into both nostrils.     levothyroxine (SYNTHROID) 25 MCG tablet Take 25 mcg by mouth every morning.     loratadine  (CLARITIN ) 10 MG tablet Take 1 tablet by mouth daily.     Melatonin 10 MG TABS Take 1 tablet by mouth as needed.     Multiple Vitamin (M.V.I. ADULT IV) Take 1 tablet by mouth daily.     ondansetron  (ZOFRAN -ODT) 4 MG disintegrating tablet Take 4 mg by mouth.     oxyCODONE  (OXY IR/ROXICODONE ) 5 MG immediate release tablet Take 5 mg by  mouth.     pantoprazole  (PROTONIX ) 20 MG tablet TAKE 1 TABLET BY MOUTH EVERY DAY 90 tablet 3   sucralfate  (CARAFATE ) 1 GM/10ML suspension Take 10 mLs (1 g total) by mouth 4 (four) times daily. 1200 mL 11   Vitamin A  2400 MCG (8000 UT) CAPS Take 1 capsule by mouth daily.     Vitamin E 180 MG (400 UNIT) CAPS Take 1 capsule by mouth daily.     Current Facility-Administered Medications  Medication Dose Route Frequency Provider Last Rate Last Admin   albumin  human 25 % solution 25 g  25 g Intravenous Once Anna, Kiran, MD         Past Surgical History:  Procedure Laterality Date   BACK SURGERY     BREAST SURGERY     CHOLECYSTECTOMY     COLONOSCOPY WITH PROPOFOL  N/A 06/02/2017   Procedure: COLONOSCOPY WITH PROPOFOL ;  Surgeon: Luke Salaam, MD;  Location: [facility] ENDOSCOPY;  Service: Gastroenterology;  Laterality:  N/A;   COLONOSCOPY WITH PROPOFOL  N/A 01/28/2021   Procedure: COLONOSCOPY WITH PROPOFOL ;  Surgeon: Luke Salaam, MD;  Location: [facility] ENDOSCOPY;  Service: Gastroenterology;  Laterality: N/A;   COLONOSCOPY WITH PROPOFOL  N/A 12/12/2021   Procedure: COLONOSCOPY WITH PROPOFOL ;  Surgeon: Luke Salaam, MD;  Location: [facility] ENDOSCOPY;  Service: Gastroenterology;  Laterality: N/A;  Patient is requesting to be last since she lives in [place].   ESOPHAGOGASTRODUODENOSCOPY (EGD) WITH PROPOFOL  N/A 07/01/2017   Procedure: ESOPHAGOGASTRODUODENOSCOPY (EGD) WITH PROPOFOL ;  Surgeon: Luke Salaam, MD;  Location: [facility] ENDOSCOPY;  Service: Gastroenterology;  Laterality: N/A;   ESOPHAGOGASTRODUODENOSCOPY (EGD) WITH PROPOFOL  N/A 01/28/2021   Procedure: ESOPHAGOGASTRODUODENOSCOPY (EGD) WITH PROPOFOL ;  Surgeon: Luke Salaam, MD;  Location: [facility] ENDOSCOPY;  Service: Gastroenterology;  Laterality: N/A;   ESOPHAGOGASTRODUODENOSCOPY (EGD) WITH PROPOFOL  N/A 12/12/2021   Procedure: ESOPHAGOGASTRODUODENOSCOPY (EGD) WITH PROPOFOL ;  Surgeon: Luke Salaam, MD;  Location: [facility] ENDOSCOPY;  Service: Gastroenterology;  Laterality: N/A;   FLEXIBLE SIGMOIDOSCOPY N/A 07/01/2017   Procedure: FLEXIBLE SIGMOIDOSCOPY;  Surgeon: Luke Salaam, MD;  Location: [facility] ENDOSCOPY;  Service: Gastroenterology;  Laterality: N/A;   KNEE SURGERY     mini gastric bypass     SHOULDER SURGERY     TONSILLECTOMY       Allergies  Allergen Reactions   Zoledronic  Acid Other (See Comments)    Other reaction(s): fever and flu-like symptoms   Azithromycin Diarrhea    Other reaction(s): diarrhea   Milk (Cow) Diarrhea   Prednisone Diarrhea    Other Reaction(s): GI Intolerance   Sulfa Antibiotics Itching   Tape Dermatitis      Family History  Problem Relation Age of Onset   Breast cancer Paternal Grandmother    Diabetes Father    Heart disease Father    Colon cancer Father    Brain cancer Mother    Healthy Daughter    Hypertension Other    Stroke Other     Diabetes Other    Heart attack Other    Obesity Other    Stomach cancer Neg Hx    Pancreatic cancer Neg Hx      Social History Ms. Swaziland reports that she has never smoked. She has never used smokeless tobacco. Ms. Swaziland reports no history of alcohol use.    Physical Examination Today's Vitals   01/18/24 1534  BP: 130/82  Pulse: 84  SpO2: 95%  Weight: 197 lb 12.8 oz (89.7 kg)  Height: 5\' 4"  (1.626 m)   Body mass index is 33.95 kg/m.  Gen: resting comfortably, no  acute distress HEENT: no scleral icterus, pupils equal round and reactive, no palptable cervical adenopathy,  CV: RRR, no m/rg, no jvd Resp: Clear to auscultation bilaterally GI: abdomen is soft, non-tender, non-distended, normal bowel sounds, no hepatosplenomegaly MSK: extremities are warm, no edema.  Skin: warm, no rash Neuro:  no focal deficits Psych: appropriate affect   Diagnostic Studies  08/2021 heart monitor 10 day monitor Occasional supraventricular ectopy in the form of isolated PACs, rare couplets and triplets. Frequent runs of SVT longest 29.2 seconds. Rare ventricular ectopy in the form of isolated PVCs, couplets. 2 runs of NSVT longest 8 beats. Reported symptoms correlated with sinus rhythm with PACs and short runs of SVT.     07/2021 nuclear stress   The study is normal. The study is low risk.   No ST deviation was noted.   LV perfusion is normal.   Left ventricular function is normal. End diastolic cavity size is normal.   07/2021 [facility] echo LVEF 45-50%, grade I dd, mild MR, mild AI   Assessment and Plan   1.SOB/LE edema - after extensive cardiac and lymphedema workup primary issues appears to be low albumin . Followed by GI, thought to be due to malabsorption due to prior gastric bypass - started on PICC line with TPN per GI, has lost 40 lbs of fluid weight over the last few months with supplementental protein leading to increased oncotic pressure, - recently required  repeat course of TPN managed by GI - no recent edema or SOB, continue to monitor   2. Palpitations - SVT on prior monitors -infrequent symptoms, monitor at this time - EKG today shows NSR  F/u 6 months    Laurann Pollock, M.D.

## 2024-02-08 ENCOUNTER — Encounter: Payer: Self-pay | Admitting: Gastroenterology

## 2024-02-09 ENCOUNTER — Other Ambulatory Visit: Payer: Self-pay

## 2024-02-09 DIAGNOSIS — E618 Deficiency of other specified nutrient elements: Secondary | ICD-10-CM

## 2024-02-09 DIAGNOSIS — E43 Unspecified severe protein-calorie malnutrition: Secondary | ICD-10-CM

## 2024-02-09 DIAGNOSIS — E46 Unspecified protein-calorie malnutrition: Secondary | ICD-10-CM

## 2024-03-02 LAB — IRON,TIBC AND FERRITIN PANEL
Ferritin: 365 ng/mL — ABNORMAL HIGH (ref 15–150)
Iron Saturation: 54 % (ref 15–55)
Iron: 144 ug/dL — ABNORMAL HIGH (ref 27–139)
Total Iron Binding Capacity: 267 ug/dL (ref 250–450)
UIBC: 123 ug/dL (ref 118–369)

## 2024-03-02 LAB — ZINC: Zinc: 74 ug/dL (ref 44–115)

## 2024-03-02 LAB — B12 AND FOLATE PANEL
Folate: 16.1 ng/mL (ref >3.0)
Vitamin B-12: 903 pg/mL (ref 232–1245)

## 2024-03-02 LAB — COPPER, SERUM: Copper: 118 ug/dL (ref 80–158)

## 2024-03-27 ENCOUNTER — Ambulatory Visit: Payer: Self-pay

## 2024-07-17 ENCOUNTER — Inpatient Hospital Stay: Attending: Physician Assistant

## 2024-07-17 DIAGNOSIS — E538 Deficiency of other specified B group vitamins: Secondary | ICD-10-CM

## 2024-07-17 DIAGNOSIS — D500 Iron deficiency anemia secondary to blood loss (chronic): Secondary | ICD-10-CM

## 2024-07-17 DIAGNOSIS — E610 Copper deficiency: Secondary | ICD-10-CM

## 2024-07-17 DIAGNOSIS — E8809 Other disorders of plasma-protein metabolism, not elsewhere classified: Secondary | ICD-10-CM | POA: Insufficient documentation

## 2024-07-17 DIAGNOSIS — D509 Iron deficiency anemia, unspecified: Secondary | ICD-10-CM | POA: Insufficient documentation

## 2024-07-17 DIAGNOSIS — Z9884 Bariatric surgery status: Secondary | ICD-10-CM | POA: Diagnosis not present

## 2024-07-17 DIAGNOSIS — D649 Anemia, unspecified: Secondary | ICD-10-CM

## 2024-07-17 DIAGNOSIS — D539 Nutritional anemia, unspecified: Secondary | ICD-10-CM

## 2024-07-17 DIAGNOSIS — K909 Intestinal malabsorption, unspecified: Secondary | ICD-10-CM

## 2024-07-17 LAB — CBC WITH DIFFERENTIAL/PLATELET
Abs Immature Granulocytes: 0.01 K/uL (ref 0.00–0.07)
Basophils Absolute: 0 K/uL (ref 0.0–0.1)
Basophils Relative: 0 %
Eosinophils Absolute: 0.1 K/uL (ref 0.0–0.5)
Eosinophils Relative: 1 %
HCT: 40.2 % (ref 36.0–46.0)
Hemoglobin: 12.9 g/dL (ref 12.0–15.0)
Immature Granulocytes: 0 %
Lymphocytes Relative: 19 %
Lymphs Abs: 1.1 K/uL (ref 0.7–4.0)
MCH: 31.6 pg (ref 26.0–34.0)
MCHC: 32.1 g/dL (ref 30.0–36.0)
MCV: 98.5 fL (ref 80.0–100.0)
Monocytes Absolute: 0.5 K/uL (ref 0.1–1.0)
Monocytes Relative: 9 %
Neutro Abs: 3.9 K/uL (ref 1.7–7.7)
Neutrophils Relative %: 71 %
Platelets: 207 K/uL (ref 150–400)
RBC: 4.08 MIL/uL (ref 3.87–5.11)
RDW: 13.4 % (ref 11.5–15.5)
WBC: 5.5 K/uL (ref 4.0–10.5)
nRBC: 0 % (ref 0.0–0.2)

## 2024-07-17 LAB — COMPREHENSIVE METABOLIC PANEL WITH GFR
ALT: 24 U/L (ref 0–44)
AST: 30 U/L (ref 15–41)
Albumin: 3.5 g/dL (ref 3.5–5.0)
Alkaline Phosphatase: 76 U/L (ref 38–126)
Anion gap: 8 (ref 5–15)
BUN: 15 mg/dL (ref 8–23)
CO2: 27 mmol/L (ref 22–32)
Calcium: 8.2 mg/dL — ABNORMAL LOW (ref 8.9–10.3)
Chloride: 106 mmol/L (ref 98–111)
Creatinine, Ser: 0.59 mg/dL (ref 0.44–1.00)
GFR, Estimated: >60 mL/min (ref >60)
Glucose, Bld: 83 mg/dL (ref 70–99)
Potassium: 3.4 mmol/L — ABNORMAL LOW (ref 3.5–5.1)
Sodium: 140 mmol/L (ref 135–145)
Total Bilirubin: 0.6 mg/dL (ref 0.0–1.2)
Total Protein: 5.3 g/dL — ABNORMAL LOW (ref 6.5–8.1)

## 2024-07-17 LAB — IRON AND TIBC
Iron: 118 ug/dL (ref 28–170)
Saturation Ratios: 60 % — ABNORMAL HIGH (ref 10.4–31.8)
TIBC: 196 ug/dL — ABNORMAL LOW (ref 250–450)
UIBC: 78 ug/dL

## 2024-07-17 LAB — VITAMIN B12: Vitamin B-12: 1224 pg/mL — ABNORMAL HIGH (ref 180–914)

## 2024-07-17 LAB — FOLATE: Folate: >20 ng/mL (ref >5.9)

## 2024-07-17 LAB — FERRITIN: Ferritin: 344 ng/mL — ABNORMAL HIGH (ref 11–307)

## 2024-07-19 LAB — COPPER, SERUM: Copper: 74 ug/dL — ABNORMAL LOW (ref 80–158)

## 2024-07-22 LAB — METHYLMALONIC ACID, SERUM: Methylmalonic Acid, Quantitative: 165 nmol/L (ref 0–378)

## 2024-07-23 NOTE — Progress Notes (Unsigned)
 [facility] [address] [place], [place] [address]   CLINIC:  Medical Oncology/Hematology  PCP:  Jolee Greig DEL, PA-C [address] [place] [address] [phone]   REASON FOR VISIT:  Follow-up for iron deficiency anemia   PRIOR THERAPY: None   CURRENT THERAPY: Intermittent IV iron (last Feraheme  in February 2025)  INTERVAL HISTORY:   [name] 73 y.o. Burns returns for routine follow-up of her iron deficiency anemia and other nutritional deficiencies.  She was last seen by Tiffany Barefoot PA-C via telemedicine visit on 01/18/2024.   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  Most recently, she has been off of TPN since March 2025.  At today's visit, she reports that she has had some decreased energy over the past 2 weeks. She denies any pica, restless legs, headache, chest pain, dyspnea, or syncope. She reports mild intermittent rectal bleeding that has occurred 3-4 times in the past year. No melena or hematemesis. Her current supplements include BariMelt bariatric multivitamin (contains vitamin B12 500 mcg, elemental iron at 18 mg, copper  2 mg, folate at 1360 mcg). She follows closely with [facility] and nutritional services.   She has 50% energy and 100% appetite.  She endorses that she is maintaining a stable weight.   ASSESSMENT & PLAN:  1.   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 Ruel Kung) Currently on TPN, as of September 2024 - SPEP and immunofixation normal. - History of anastomotic ulcer many years ago - Episode of hematochezia and fecal occult blood positive in September 2018, required  PRBC transfusions - Anoscopy (12/12/2021):  Normal colon. - Most recent EGD (06/23/2024 at [facility]): Normal mini gastric bypass anatomy.  No ulcers, polyps, fistulas, or other abnormalities. - She receives intermittent IV Feraheme , most recently in February 2025.  She reports that her energy improves after IV iron. - She required intermittent TPN due to severe nutritional deficiencies.  First course of TPN from March 2023 to October 2023.  Second course of TPN from September 2024 through March 2025. - Previously required multiple supplements due to several vitamin deficiencies, but this improved when she was placed on TPN. - Current supplements include BariMelt bariatric multivitamin (contains vitamin B12 500 mcg, elemental iron at 18 mg, copper  2 mg, folate at 1360 mcg). - Most recent labs (07/17/2024): CBC is normal with Hgb 12.9, MCV 98.5.  Normal platelets/WBC. Creatinine 0.59/GFR >60 Ferritin 344, iron saturation 60% Copper  is slightly low at 74 Normal folate, B12 (1224), MMA. - Occasional rectal bleeding.  No epistaxis, hematemesis, or melena.     - PLAN: Recommend to add COPPER  2 mg supplement in addition to continuing multivitamin. - Vitamin B12 and ferritin are slightly elevated.  She can continue bariatric multivitamin at present, but if levels remain high, she may need to look for different multivitamin with lower amounts of B12/iron. -  Labs in 6 months (CBC/D, ferritin, iron/TIBC, B12, MMA, folate, copper ) + OFFICE visit 1 week after labs  - She receives weekly labs at [facility] ordered by her GI specialist.  She watches these results closely, and has been instructed to call our office if she has Hgb <10.0.   PLAN SUMMARY: **MON or TUES appointments only** >>  Labs in 6 months = CBC/D, ferritin, iron/TIBC, B12, MMA, folate, copper , CMP >> OFFICE visit 1 week after labs     REVIEW OF SYSTEMS:   Review of Systems  Constitutional:  Positive for fatigue. Negative for appetite change,  chills, diaphoresis, fever and unexpected weight change.  HENT:   Negative for lump/mass and nosebleeds.   Eyes:  Negative for eye problems.  Respiratory:  Negative for cough, hemoptysis and shortness of breath.   Cardiovascular:  Negative for chest pain, leg swelling and palpitations.  Gastrointestinal:  Negative for abdominal pain, blood in stool, constipation, diarrhea, nausea and vomiting.  Genitourinary:  Negative for hematuria.   Skin: Negative.   Neurological:  Positive for dizziness and numbness. Negative for headaches and light-headedness.  Hematological:  Does not bruise/bleed easily.     PHYSICAL EXAM:  ECOG PERFORMANCE STATUS: 1 - Symptomatic but completely ambulatory  Vitals:   07/24/24 1028  BP: 124/76  Pulse: 79  Resp: 18  Temp: 97.8 F (36.6 C)  SpO2: 98%   Filed Weights   07/24/24 1028  Weight: 193 lb 12.6 oz (87.9 kg)   Physical Exam Constitutional:      Appearance: Normal appearance. She is obese.  Cardiovascular:     Heart sounds: Normal heart sounds.  Pulmonary:     Breath sounds: Normal breath sounds.  Neurological:     General: No focal deficit present.     Mental Status: Mental status is at baseline.  Psychiatric:        Behavior: Behavior normal. Behavior is cooperative.     PAST MEDICAL/SURGICAL HISTORY:  Past Medical History:  Diagnosis Date   Asthma    CHF (congestive heart failure) (HCC)    GERD (gastroesophageal reflux disease)    Hypertension    Internal hemorrhoids    Iron deficiency anemia    Osteoporosis    Peptic ulcer    Tubular adenoma of colon    Vertigo    Past Surgical History:  Procedure Laterality Date   BACK SURGERY     BREAST SURGERY     CHOLECYSTECTOMY     COLONOSCOPY WITH PROPOFOL  N/A 06/02/2017   Procedure: COLONOSCOPY WITH PROPOFOL ;  Surgeon: Therisa Bi, MD;  Location[facility] END[facility]COPY;  Service: Gastroenterology;  Laterality: N/A;   COLONOSCOPY WITH PROPOFOL  N/A 01/28/2021   Procedure: COLONOSCOPY WITH  PROPOFOL ;  Surgeon: Therisa Bi, MD;  Location: [facility] ENDOSCOPY;  Service: Gastroenterology;  Laterality: N/A;   COLONOSCOPY WITH PROPOFOL  N/A 12/12/2021   Procedure: COLONOSCOPY WITH PROPOFOL ;  Surgeon: Therisa Bi, MD;  Location: [facility] ENDOSCOPY;  Service: Gastroenterology;  Laterality: N/A;  Patient is requesting to be last since she lives in [place].   ESOPHAGOGASTRODUODENOSCOPY (EGD) WITH PROPOFOL  N/A 07/01/2017   Procedure: ESOPHAGOGASTRODUODENOSCOPY (EGD) WITH PROPOFOL ;  Surgeon: Therisa Bi, MD;  Location: [facility] ENDOSCOPY;  Service: Gastroenterology;  Laterality: N/A;   ESOPHAGOGASTRODUODENOSCOPY (EGD) WITH PROPOFOL  N/A 01/28/2021   Procedure: ESOPHAGOGASTRODUODENOSCOPY (EGD) WITH PROPOFOL ;  Surgeon: Therisa Bi, MD;  Location: [facility] ENDOSCOPY;  Service: Gastroenterology;  Laterality: N/A;   ESOPHAGOGASTRODUODENOSCOPY (EGD) WITH PROPOFOL  N/A 12/12/2021   Procedure: ESOPHAGOGASTRODUODENOSCOPY (EGD) WITH PROPOFOL ;  Surgeon: Therisa Bi, MD;  Location: [facility] ENDOSCOPY;  Service: Gastroenterology;  Laterality: N/A;   FLEXIBLE SIGMOIDOSCOPY N/A 07/01/2017   Procedure: FLEXIBLE SIGMOIDOSCOPY;  Surgeon: Therisa Bi, MD;  Location: [facility] ENDOSCOPY;  Service: Gastroenterology;  Laterality: N/A;   KNEE SURGERY     mini gastric bypass     SHOULDER SURGERY     TONSILLECTOMY  SOCIAL HISTORY:  Social History   Socioeconomic History   Marital status: Married    Spouse name: Not on file   Number of children: Not on file   Years of education: Not on file   Highest education level: Not on file  Occupational History   Not on file  Tobacco Use   Smoking status: Never   Smokeless tobacco: Never  Vaping Use   Vaping status: Never Used  Substance and Sexual Activity   Alcohol use: No    Alcohol/week: 0.0 standard drinks of alcohol   Drug use: Never   Sexual activity: Yes  Other Topics Concern   Not on file  Social History Narrative   Not on file   Social Drivers of Health   Financial Resource  Strain: Low Risk  (12/09/2023)   Received from [facility]   Overall Financial Resource Strain (CARDIA)    Difficulty of Paying Living Expenses: Not hard at all  Food Insecurity: No Food Insecurity (12/09/2023)   Received from [facility]   Hunger Vital Sign    Within the past 12 months, you worried that your food would run out before you got the money to buy more.: Never true    Within the past 12 months, the food you bought just didn't last and you didn't have money to get more.: Never true  Transportation Needs: No Transportation Needs (12/09/2023)   Received from [facility] - Transportation    In the past 12 months, has lack of transportation kept you from medical appointments or from getting medications?: No    Lack of Transportation (Non-Medical): No  Physical Activity: Inactive (08/09/2023)   Exercise Vital Sign    Days of Exercise per Week: 0 days    Minutes of Exercise per Session: 0 min  Stress: No Stress Concern Present (08/09/2023)   [employer] of Occupational Health - Occupational Stress Questionnaire    Feeling of Stress : Only a little  Social Connections: Socially Integrated (08/09/2023)   Social Connection and Isolation Panel    Frequency of Communication with Friends and Family: More than three times a week    Frequency of Social Gatherings with Friends and Family: More than three times a week    Attends Religious Services: More than 4 times per year    Active Member of [employer] or Organizations: Yes    Attends Engineer, Structural: More than 4 times per year    Marital Status: Married  Catering Manager Violence: Not At Risk (08/09/2023)   Humiliation, Afraid, Rape, and Kick questionnaire    Fear of Current or Ex-Partner: No    Emotionally Abused: No    Physically Abused: No    Sexually Abused: No    FAMILY HISTORY:  Family History  Problem Relation Age of Onset   Breast cancer  Paternal Grandmother    Diabetes Father    Heart disease Father    Colon cancer Father    Brain cancer Mother    Healthy Daughter    Hypertension Other    Stroke Other    Diabetes Other    Heart attack Other    Obesity Other    Stomach cancer Neg Hx    Pancreatic cancer Neg Hx     CURRENT MEDICATIONS:  Outpatient Encounter Medications as of 07/24/2024  Medication Sig   albuterol  (VENTOLIN  HFA) 108 (90 Base) MCG/ACT inhaler    amoxicillin-clavulanate (AUGMENTIN) 875-125 MG tablet Take  1 tablet by mouth 2 (two) times daily.   Ascorbic Acid (VITAMIN C PO) Take 2,000 Units by mouth daily.   azelastine (ASTELIN) 0.1 % nasal spray    calcitRIOL  (ROCALTROL ) 0.5 MCG capsule Take 0.5 mcg by mouth daily.   Cholecalciferol  ([employer] D3) 125 MCG (5000 UT) capsule Take 1 tablet by mouth daily.   denosumab (PROLIA) 60 MG/ML SOSY injection Inject 60 mg into the skin every 6 (six) months.   fluticasone  (FLONASE) 50 MCG/ACT nasal spray Place into both nostrils.   levothyroxine (SYNTHROID) 25 MCG tablet Take 25 mcg by mouth every morning.   loratadine  (CLARITIN ) 10 MG tablet Take 1 tablet by mouth daily.   Melatonin 10 MG TABS Take 1 tablet by mouth as needed.   Multiple Vitamin (M.V.I. ADULT IV) Take 1 tablet by mouth daily.   pantoprazole  (PROTONIX ) 20 MG tablet TAKE 1 TABLET BY MOUTH EVERY DAY   sucralfate  (CARAFATE ) 1 GM/10ML suspension Take 10 mLs (1 g total) by mouth 4 (four) times daily.   Vitamin A  2400 MCG (8000 UT) CAPS Take 1 capsule by mouth daily.   Vitamin E 180 MG (400 UNIT) CAPS Take 1 capsule by mouth daily.   Facility-Administered Encounter Medications as of 07/24/2024  Medication   albumin  human 25 % solution 25 g    ALLERGIES:  Allergies  Allergen Reactions   Zoledronic  Acid Other (See Comments)    Other reaction(s): fever and flu-like symptoms   Azithromycin Diarrhea    Other reaction(s): diarrhea   Milk (Cow) Diarrhea   Prednisone Diarrhea    Other Reaction(s): GI  Intolerance   Sulfa Antibiotics Itching   Tape Dermatitis    LABORATORY DATA:  I have reviewed the labs as listed.  CBC    Component Value Date/Time   WBC 5.5 07/17/2024 1012   RBC 4.08 07/17/2024 1012   HGB 12.9 07/17/2024 1012   HGB 11.2 05/18/2023 0921   HCT 40.2 07/17/2024 1012   HCT 34.2 05/18/2023 0921   PLT 207 07/17/2024 1012   PLT 215 05/18/2023 0921   MCV 98.5 07/17/2024 1012   MCV 100 (H) 05/18/2023 0921   MCH 31.6 07/17/2024 1012   MCHC 32.1 07/17/2024 1012   RDW 13.4 07/17/2024 1012   RDW 12.6 05/18/2023 0921   LYMPHSABS 1.1 07/17/2024 1012   LYMPHSABS 1.4 05/18/2023 0921   MONOABS 0.5 07/17/2024 1012   EOSABS 0.1 07/17/2024 1012   EOSABS 0.0 05/18/2023 0921   BASOSABS 0.0 07/17/2024 1012   BASOSABS 0.0 05/18/2023 0921      Latest Ref Rng & Units 07/17/2024   10:12 AM 01/11/2024    8:57 AM 09/20/2023   10:12 AM  CMP  Glucose 70 - 99 mg/dL 83  89    BUN 8 - 23 mg/dL 15  10    Creatinine 9.55 - 1.00 mg/dL 9.40  9.48    Sodium 864 - 145 mmol/L 140  140    Potassium 3.5 - 5.1 mmol/L 3.4  2.9    Chloride 98 - 111 mmol/L 106  107    CO2 22 - 32 mmol/L 27  24    Calcium  8.9 - 10.3 mg/dL 8.2  8.2    Total Protein 6.5 - 8.1 g/dL 5.3  5.9    Total Bilirubin 0.0 - 1.2 mg/dL 0.6  0.8  0.3   Alkaline Phos 38 - 126 U/L 76  49    AST 15 - 41 U/L 30  47    ALT 0 -  44 U/L 24  43      DIAGNOSTIC IMAGING:  I have independently reviewed the relevant imaging and discussed with the patient.   WRAP UP:  All questions were answered. The patient knows to call the clinic with any problems, questions or concerns.  Medical decision making: Moderate  Time spent on visit: I spent 20 minutes counseling the patient face to face. The total time spent in the appointment was 30 minutes and more than 50% was on counseling.  Tiffany CHRISTELLA Barefoot, PA-C  07/24/24 11:47 AM

## 2024-07-24 ENCOUNTER — Other Ambulatory Visit: Payer: Self-pay

## 2024-07-24 ENCOUNTER — Inpatient Hospital Stay: Admitting: Physician Assistant

## 2024-07-24 VITALS — BP 124/76 | HR 79 | Temp 97.8°F | Resp 18 | Ht 64.0 in | Wt 193.8 lb

## 2024-07-24 DIAGNOSIS — E538 Deficiency of other specified B group vitamins: Secondary | ICD-10-CM | POA: Diagnosis not present

## 2024-07-24 DIAGNOSIS — E610 Copper deficiency: Secondary | ICD-10-CM

## 2024-07-24 DIAGNOSIS — K909 Intestinal malabsorption, unspecified: Secondary | ICD-10-CM | POA: Diagnosis not present

## 2024-07-24 DIAGNOSIS — D500 Iron deficiency anemia secondary to blood loss (chronic): Secondary | ICD-10-CM

## 2024-07-24 DIAGNOSIS — D509 Iron deficiency anemia, unspecified: Secondary | ICD-10-CM | POA: Diagnosis not present

## 2024-07-24 DIAGNOSIS — D649 Anemia, unspecified: Secondary | ICD-10-CM

## 2024-07-24 DIAGNOSIS — D539 Nutritional anemia, unspecified: Secondary | ICD-10-CM

## 2024-07-24 NOTE — Patient Instructions (Addendum)
 [facility] at [facility] **VISIT SUMMARY & IMPORTANT INSTRUCTIONS **   You were seen today by Pleasant Barefoot PA-C for your anemia and nutritional deficiencies.   Your blood levels look great!  You are not anemic at this time. Your current bariatric multivitamin contains 500 mcg vitamin B12, 18 mg of iron, 2 mg of copper , and 1360 mcg of folate. Your iron and vitamin B12 levels are slightly elevated.  If they continue to remain elevated in the future, we may need to look for a multivitamin that does not contain as much iron and B12. Your copper  levels are slightly low.  Please start taking copper  gluconate 2 mg daily in addition to your multivitamin.  This can be found over-the-counter or online. We will recheck labs and see you for follow-up visit in 6 months.  ** Thank you for trusting me with your healthcare!  I strive to provide all of my patients with quality care at each visit.  If you receive a survey for this visit, I would be so grateful to you for taking the time to provide feedback.  Thank you in advance!  ~ Mikenzie Mccannon                                        Dr. Mickiel Davonna Pleasant Barefoot, PA-C      Delon Hope, NP   - - - - - - - - - - - - - - - - - -    Thank you for choosing [facility] at [facility] to provide your oncology and hematology care.  To afford each patient quality time with our provider, please arrive at least 15 minutes before your scheduled appointment time.   If you have a lab appointment with the Cancer Center please come in thru the Main Entrance and check in at the main information desk.  You need to re-schedule your appointment should you arrive 10 or more minutes late.  We strive to give you quality time with our providers, and arriving late affects you and other patients whose appointments are after yours.  Also, if you no show three or more times for appointments you may be dismissed from the clinic  at the providers discretion.     Again, thank you for choosing [facility].  Our hope is that these requests will decrease the amount of time that you wait before being seen by our physicians.       _____________________________________________________________  Should you have questions after your visit to [facility], please contact our office at [phone] and follow the prompts.  Our office hours are 8:00 a.m. and 4:30 p.m. Monday - Friday.  Please note that voicemails left after 4:00 p.m. may not be returned until the following business day.  We are closed weekends and major holidays.  You do have access to a nurse 24-7, just call the main number to the clinic [phone] and do not press any options, hold on the line and a nurse will answer the phone.    For prescription refill requests, have your pharmacy contact our office and allow 72 hours.

## 2024-09-11 ENCOUNTER — Encounter: Payer: Self-pay | Admitting: *Deleted

## 2025-01-15 ENCOUNTER — Inpatient Hospital Stay

## 2025-01-22 ENCOUNTER — Inpatient Hospital Stay: Admitting: Physician Assistant
